# Patient Record
Sex: Female | Born: 1970 | Race: White | Hispanic: No | Marital: Single | Smoking: Former smoker
Health system: Southern US, Community
[De-identification: ages and names within clinical notes are randomized; demographics above are authoritative.]

## PROBLEM LIST (undated history)

## (undated) DIAGNOSIS — M2550 Pain in unspecified joint: Secondary | ICD-10-CM

## (undated) DIAGNOSIS — Z8489 Family history of other specified conditions: Secondary | ICD-10-CM

## (undated) DIAGNOSIS — E669 Obesity, unspecified: Secondary | ICD-10-CM

## (undated) DIAGNOSIS — F32A Depression, unspecified: Secondary | ICD-10-CM

## (undated) DIAGNOSIS — F419 Anxiety disorder, unspecified: Secondary | ICD-10-CM

## (undated) DIAGNOSIS — K219 Gastro-esophageal reflux disease without esophagitis: Secondary | ICD-10-CM

## (undated) DIAGNOSIS — E282 Polycystic ovarian syndrome: Secondary | ICD-10-CM

## (undated) DIAGNOSIS — J45909 Unspecified asthma, uncomplicated: Secondary | ICD-10-CM

## (undated) DIAGNOSIS — R519 Headache, unspecified: Secondary | ICD-10-CM

## (undated) DIAGNOSIS — R011 Cardiac murmur, unspecified: Secondary | ICD-10-CM

## (undated) DIAGNOSIS — Z87898 Personal history of other specified conditions: Secondary | ICD-10-CM

## (undated) DIAGNOSIS — R51 Headache: Secondary | ICD-10-CM

## (undated) DIAGNOSIS — D649 Anemia, unspecified: Secondary | ICD-10-CM

## (undated) DIAGNOSIS — Z8739 Personal history of other diseases of the musculoskeletal system and connective tissue: Secondary | ICD-10-CM

## (undated) DIAGNOSIS — Z86718 Personal history of other venous thrombosis and embolism: Secondary | ICD-10-CM

## (undated) DIAGNOSIS — G4700 Insomnia, unspecified: Secondary | ICD-10-CM

## (undated) DIAGNOSIS — R203 Hyperesthesia: Secondary | ICD-10-CM

## (undated) DIAGNOSIS — M549 Dorsalgia, unspecified: Secondary | ICD-10-CM

## (undated) DIAGNOSIS — G4730 Sleep apnea, unspecified: Secondary | ICD-10-CM

## (undated) DIAGNOSIS — M797 Fibromyalgia: Secondary | ICD-10-CM

## (undated) DIAGNOSIS — R600 Localized edema: Secondary | ICD-10-CM

## (undated) DIAGNOSIS — F329 Major depressive disorder, single episode, unspecified: Secondary | ICD-10-CM

## (undated) DIAGNOSIS — I10 Essential (primary) hypertension: Secondary | ICD-10-CM

## (undated) DIAGNOSIS — M5126 Other intervertebral disc displacement, lumbar region: Secondary | ICD-10-CM

## (undated) DIAGNOSIS — M1990 Unspecified osteoarthritis, unspecified site: Secondary | ICD-10-CM

## (undated) DIAGNOSIS — B2790 Infectious mononucleosis, unspecified without complication: Secondary | ICD-10-CM

## (undated) HISTORY — DX: Anxiety disorder, unspecified: F41.9

## (undated) HISTORY — DX: Localized edema: R60.0

## (undated) HISTORY — DX: Unspecified osteoarthritis, unspecified site: M19.90

## (undated) HISTORY — DX: Personal history of other venous thrombosis and embolism: Z86.718

## (undated) HISTORY — DX: Cardiac murmur, unspecified: R01.1

## (undated) HISTORY — DX: Dorsalgia, unspecified: M54.9

## (undated) HISTORY — DX: Depression, unspecified: F32.A

## (undated) HISTORY — DX: Anemia, unspecified: D64.9

## (undated) HISTORY — DX: Major depressive disorder, single episode, unspecified: F32.9

## (undated) HISTORY — DX: Pain in unspecified joint: M25.50

## (undated) HISTORY — PX: WISDOM TOOTH EXTRACTION: SHX21

## (undated) HISTORY — DX: Polycystic ovarian syndrome: E28.2

## (undated) HISTORY — DX: Obesity, unspecified: E66.9

## (undated) HISTORY — DX: Gastro-esophageal reflux disease without esophagitis: K21.9

## (undated) HISTORY — DX: Infectious mononucleosis, unspecified without complication: B27.90

## (undated) HISTORY — DX: Fibromyalgia: M79.7

---

## 2003-04-12 ENCOUNTER — Encounter: Admission: RE | Admit: 2003-04-12 | Discharge: 2003-04-12 | Payer: Self-pay | Admitting: Specialist

## 2004-05-29 ENCOUNTER — Ambulatory Visit: Payer: Self-pay | Admitting: Internal Medicine

## 2004-08-15 ENCOUNTER — Ambulatory Visit: Payer: Self-pay | Admitting: Family Medicine

## 2010-05-22 ENCOUNTER — Encounter: Payer: Self-pay | Admitting: Family Medicine

## 2010-05-22 LAB — CONVERTED CEMR LAB: Pap Smear: NORMAL

## 2010-06-19 ENCOUNTER — Ambulatory Visit
Admission: RE | Admit: 2010-06-19 | Discharge: 2010-06-19 | Payer: Self-pay | Source: Home / Self Care | Attending: Family Medicine | Admitting: Family Medicine

## 2010-06-19 DIAGNOSIS — E282 Polycystic ovarian syndrome: Secondary | ICD-10-CM | POA: Insufficient documentation

## 2010-06-19 DIAGNOSIS — J42 Unspecified chronic bronchitis: Secondary | ICD-10-CM | POA: Insufficient documentation

## 2010-07-12 NOTE — Assessment & Plan Note (Signed)
Summary: sinus infection?,cough/jbb   Vital Signs:  Patient Profile:   40 Years Old Female CC:      Cold & URI symptoms Height:     67 inches Weight:      312 pounds BMI:     49.04 O2 Sat:      98 % O2 treatment:    Room Air Temp:     99.4 degrees F oral Pulse rate:   93 / minute Pulse rhythm:   regular Resp:     20 per minute  Pt. in pain?   no  Vitals Entered By: Levonne Spiller EMT-P (June 19, 2010 3:49 PM)              Is Patient Diabetic? No  Does patient need assistance? Functional Status Self care Ambulation Normal      Current Allergies: ! CEPHALEXIN (CEPHALEXIN) ! WELLBUTRIN ! * BEE STINGSHistory of Present Illness History from: patient Chief Complaint: Cold & URI symptoms History of Present Illness: The patient is presenting today for an evauation of 1 month of symptoms of cough and congestion from yellow mucous production.  She reports that she has been having a dry hacking cough, especially at night.  She has been having sinus pressure, pain, postnasal drainage, and reports a low grade fever.  She has been having some wheezing. She reports a history of chronic bronchitis COPD.    She says that she has been concerned because it has been lingering for a long time at least a month.  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  See ROS - mild left ear ache  REVIEW OF SYSTEMS Constitutional Symptoms       Complains of fever, chills, night sweats, and fatigue.     Denies weight loss and weight gain.  Eyes       Denies change in vision, eye pain, eye discharge, glasses, contact lenses, and eye surgery. Ear/Nose/Throat/Mouth       Complains of change in hearing, ear pain, dizziness, frequent runny nose, frequent nose bleeds, sinus problems, and sore throat.      Denies hearing loss/aids, ear discharge, hoarseness, and tooth pain or bleeding.      Comments: Left Ear Ache Respiratory  Complains of dry cough, shortness of breath, and bronchitis.      Denies productive cough, wheezing, asthma, and emphysema/COPD.      Comments: Hx of Bronchitis Cardiovascular       Denies murmurs, chest pain, and tires easily with exhertion.    Gastrointestinal       Denies stomach pain, nausea/vomiting, diarrhea, constipation, blood in bowel movements, and indigestion. Genitourniary       Denies painful urination, blood or discharge from vagina, kidney stones, and loss of urinary control. Neurological       Complains of headaches.      Denies paralysis, seizures, and fainting/blackouts. Musculoskeletal       Denies muscle pain, joint pain, joint stiffness, decreased range of motion, redness, swelling, muscle weakness, and gout.  Skin       Denies bruising, unusual mles/lumps or sores, and hair/skin or nail changes.  Psych       Denies mood changes, temper/anger issues, anxiety/stress, speech problems, depression, and sleep problems.  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   Physical Exam General appearance: well developed, well nourished, no acute distress Head: normocephalic, atraumatic Eyes: conjunctivae and lids normal Pupils: equal, round, reactive to light Ears: normal, no lesions or deformities Nasal: swollen red turbinates with congestion, yellow mucus secretion, postnasal drainage Oral/Pharynx: tongue normal, posterior pharynx without erythema or exudate Neck: neck supple,  trachea midline, no masses Chest/Lungs: no rales, wheezes, or rhonchi bilateral, breath sounds equal without effort Heart: regular rate and  rhythm, no murmur Abdomen: soft,  non-tender without obvious organomegaly Extremities: normal extremities Neurological: grossly intact and non-focal Skin: no obvious rashes or lesions MSE: oriented to time, place, and person Assessment New Problems: Hx of POLYCYSTIC OVARIAN DISEASE (ICD-256.4) Hx of BRONCHITIS, CHRONIC (ICD-491.9) ACUTE SINUSITIS, UNSPECIFIED (ICD-461.9)   Patient Education: Patient and/or caregiver instructed in the following: rest, fluids. The risks, benefits and possible side effects were clearly explained and discussed with the patient.  The patient verbalized clear understanding.  The patient was given instructions to return if symptoms don't improve, worsen or new changes develop.  If it is not during clinic hours and the patient cannot get back to this clinic then the patient was told to seek medical care at an available urgent care or emergency department.  The patient verbalized understanding.   Demonstrates willingness to comply.  Plan New Medications/Changes: ZYRTEC-D ALLERGY & CONGESTION 5-120 MG XR12H-TAB (CETIRIZINE-PSEUDOEPHEDRINE) take 1 by mouth every 12 hours as needed for nasal congestion, runny nose  #12 x 0, 06/19/2010, Deshanti Adcox MD VENTOLIN HFA 108 (90 BASE) MCG/ACT AERS (ALBUTEROL SULFATE) 2 puffs every 4 hours as needed cough, wheezes, SOB  #1 x 0, 06/19/2010, Nazia Rhines MD FLUTICASONE PROPIONATE 50 MCG/ACT SUSP (FLUTICASONE PROPIONATE) 2 sprays per nostril once daily  #1 x 0, 06/19/2010, Hartleigh Edmonston MD DOXYCYCLINE HYCLATE 100 MG TABS (DOXYCYCLINE HYCLATE) take 1 by mouth with food until completed.  Caution Will Decrease effectiveness of Nuva Ring!  #20 x 0, 06/19/2010, Standley Dakins MD  Planning Comments:   Go to the pharmacy and pick up your prescription (s).  It may take up to 30 mins for electronic prescriptions to be delivered to the pharmacy.  Please call if your pharmacy has not received your prescriptions after 30 minutes.   Return or go to the ER if no  improvement or symptoms getting worse.     Follow Up: Follow up in 2-3 days if no improvement, Follow up on an as needed basis, Follow up with Primary Physician  The patient and/or caregiver has been counseled thoroughly with regard to medications prescribed including dosage, schedule, interactions, rationale for use, and possible side effects and they verbalize understanding.  Diagnoses and expected course of recovery discussed and will return if not improved as expected or if the condition worsens. Patient and/or caregiver verbalized understanding.  Prescriptions: ZYRTEC-D ALLERGY & CONGESTION 5-120 MG XR12H-TAB (CETIRIZINE-PSEUDOEPHEDRINE) take 1 by mouth every 12 hours as needed for nasal congestion, runny nose  #12 x 0   Entered and Authorized by:   Standley Dakins MD   Signed by:   Standley Dakins MD on 06/19/2010   Method used:   Electronically to        [facility] (retail)       [address]       [place], [place]  [address]       Ph: [phone]       Fax: [phone]   RxID:   [id number] VENTOLIN HFA 108 (90 BASE) MCG/ACT AERS (ALBUTEROL SULFATE) 2 puffs every 4 hours as needed cough, wheezes, SOB  #1 x 0   Entered and Authorized by:   Standley Dakins MD   Signed by:   Standley Dakins MD on 06/19/2010   Method used:   Electronically to        [facility] (retail)       [address]       [place], [place]  [address]       Ph: [phone]       Fax: [phone]   RxID:   [id number] FLUTICASONE PROPIONATE 50 MCG/ACT SUSP (FLUTICASONE PROPIONATE) 2 sprays per nostril once daily  #1 x 0   Entered and Authorized by:   Standley Dakins MD   Signed by:   Standley Dakins MD on 06/19/2010   Method used:   Electronically to        [facility] (retail)       [address]       [place], [place]  [address]       Ph: [phone]        Fax: [phone]   RxID:   [id number] DOXYCYCLINE HYCLATE 100 MG TABS (DOXYCYCLINE HYCLATE) take 1 by mouth with food until completed.  Caution Will Decrease effectiveness of Nuva Ring!  #20 x 0   Entered and Authorized by:   Standley Dakins MD   Signed by:   Standley Dakins MD on 06/19/2010   Method used:   Electronically to        [facility] ([facility])       [address]       [place], [place]  [address]       Ph: [phone]       Fax: [phone]   RxID:   [id number]   Patient Instructions: 1)  Take your antibiotic as prescribed until ALL of it is gone, but stop if you develop a rash or swelling and contact our office as soon as possible. 2)  Acute sinusitis symptoms for less than 10 days are not helped by antibiotics.Use warm moist compresses, and over the counter decongestants ( only as directed). Call if no improvement in 5-7 days, sooner if increasing pain, fever, or new symptoms. 3)  Return or go to the ER if no improvement or symptoms getting worse.   4)  Go to the pharmacy and pick up your prescription (s).  It may take up to 30 mins for electronic prescriptions to be delivered to the pharmacy.  Please call if your pharmacy has not received your prescriptions after 30 minutes.   5)  The patient was informed that there is no on-call provider or services available at this clinic during off-hours (when the clinic is closed).  If the patient developed a problem or concern that required immediate attention, the patient was advised to go the the nearest available urgent care or emergency department for medical care.  The patient verbalized understanding.

## 2010-08-02 ENCOUNTER — Encounter: Payer: Self-pay | Admitting: Family Medicine

## 2010-08-02 ENCOUNTER — Other Ambulatory Visit: Payer: Self-pay | Admitting: Family Medicine

## 2010-08-02 ENCOUNTER — Ambulatory Visit (INDEPENDENT_AMBULATORY_CARE_PROVIDER_SITE_OTHER): Payer: BC Managed Care – PPO | Admitting: Family Medicine

## 2010-08-02 DIAGNOSIS — M62838 Other muscle spasm: Secondary | ICD-10-CM | POA: Insufficient documentation

## 2010-08-02 DIAGNOSIS — R5381 Other malaise: Secondary | ICD-10-CM | POA: Insufficient documentation

## 2010-08-02 DIAGNOSIS — R5383 Other fatigue: Secondary | ICD-10-CM

## 2010-08-02 LAB — HEPATIC FUNCTION PANEL
ALT: 15 U/L (ref 0–35)
AST: 17 U/L (ref 0–37)
Albumin: 3.5 g/dL (ref 3.5–5.2)
Alkaline Phosphatase: 34 U/L — ABNORMAL LOW (ref 39–117)
Bilirubin, Direct: 0.1 mg/dL (ref 0.0–0.3)
Total Bilirubin: 0.6 mg/dL (ref 0.3–1.2)
Total Protein: 6.6 g/dL (ref 6.0–8.3)

## 2010-08-02 LAB — IBC PANEL
Iron: 84 ug/dL (ref 42–145)
Saturation Ratios: 22.7 % (ref 20.0–50.0)
Transferrin: 263.8 mg/dL (ref 212.0–360.0)

## 2010-08-02 LAB — CBC WITH DIFFERENTIAL/PLATELET
Basophils Absolute: 0 10*3/uL (ref 0.0–0.1)
Basophils Relative: 0.4 % (ref 0.0–3.0)
Eosinophils Absolute: 0.1 10*3/uL (ref 0.0–0.7)
Eosinophils Relative: 1.7 % (ref 0.0–5.0)
HCT: 36.6 % (ref 36.0–46.0)
Hemoglobin: 12.7 g/dL (ref 12.0–15.0)
Lymphocytes Relative: 31 % (ref 12.0–46.0)
Lymphs Abs: 2.4 10*3/uL (ref 0.7–4.0)
MCHC: 34.6 g/dL (ref 30.0–36.0)
MCV: 91 fl (ref 78.0–100.0)
Monocytes Absolute: 0.5 10*3/uL (ref 0.1–1.0)
Monocytes Relative: 7 % (ref 3.0–12.0)
Neutro Abs: 4.6 10*3/uL (ref 1.4–7.7)
Neutrophils Relative %: 59.9 % (ref 43.0–77.0)
Platelets: 251 10*3/uL (ref 150.0–400.0)
RBC: 4.02 Mil/uL (ref 3.87–5.11)
RDW: 12.9 % (ref 11.5–14.6)
WBC: 7.7 10*3/uL (ref 4.5–10.5)

## 2010-08-02 LAB — BASIC METABOLIC PANEL
BUN: 12 mg/dL (ref 6–23)
CO2: 25 mEq/L (ref 19–32)
Calcium: 8.9 mg/dL (ref 8.4–10.5)
Chloride: 108 mEq/L (ref 96–112)
Creatinine, Ser: 0.8 mg/dL (ref 0.4–1.2)
GFR: 89.59 mL/min (ref >60.00)
Glucose, Bld: 87 mg/dL (ref 70–99)
Potassium: 4.1 mEq/L (ref 3.5–5.1)
Sodium: 139 mEq/L (ref 135–145)

## 2010-08-02 LAB — TSH: TSH: 2.07 u[IU]/mL (ref 0.35–5.50)

## 2010-08-02 LAB — B12 AND FOLATE PANEL
Folate: 18.6 ng/mL (ref >5.9)
Vitamin B-12: 237 pg/mL (ref 211–911)

## 2010-08-03 LAB — CONVERTED CEMR LAB: Vit D, 25-Hydroxy: 17 ng/mL — ABNORMAL LOW (ref 30–89)

## 2010-08-07 NOTE — Assessment & Plan Note (Signed)
Summary: NEW PATIENT TO EST/CLE  [employer],MAILED NPP   Vital Signs:  Patient profile:   40 year old female Height:      67 inches Weight:      323.50 pounds BMI:     50.85 Temp:     98.1 degrees F oral Pulse rate:   76 / minute Pulse rhythm:   regular BP sitting:   112 / 82  (right arm) Cuff size:   large  Vitals Entered By: Linde Gillis CMA Duncan Dull) (August 02, 2010 10:53 AM) CC: new patient, establish care   History of Present Illness: 40 yo here to establish care with complaint of fatigue.  fatigue- she is concerned that there is something wrong with her heart because her dad had very early onset CA, first MI at 19, s/p CABG x 3, stents.   past 6 months, progressively worsening fatigue, DOE, atypical CP not relieved by rest.  CP can occur at rest or with exertion, very fleeting, central location.  no radiation, no diaphoresis, nausea, or vomiting. no changes in her stool, hot/cold intolerance or palpiations.  does not sleep well.  difficulty falling and staying asleep. per pt, had normal sleep study a few years ago.  periods not too heavy, on nuvaring.  neck pain- frequent  tension headaches and noticed lump on left side of neck when she has these headache. no focal neurological deficits. no blurred vision.   Current Medications (verified): 1)  Multi-Vitamin .Marland Kitchen.. 1 Per Day 2)  Nuvaring 0.12-0.015 Mg/24hr Ring (Etonogestrel-Ethinyl Estradiol) 3)  Fluticasone Propionate 50 Mcg/act Susp (Fluticasone Propionate) .... 2 Sprays Per Nostril Once Daily 4)  Ventolin Hfa 108 (90 Base) Mcg/act Aers (Albuterol Sulfate) .... 2 Puffs Every 4 Hours As Needed Cough, Wheezes, Sob 5)  Zyrtec-D Allergy & Congestion 5-120 Mg Xr12h-Tab (Cetirizine-Pseudoephedrine) .... Take 1 By Mouth Every 12 Hours As Needed For Nasal Congestion, Runny Nose 6)  Cyclobenzaprine Hcl 10 Mg  Tabs (Cyclobenzaprine Hcl) .Marland Kitchen.. 1 By Mouth 2 Times Daily As Needed For Back Pain  Allergies: 1)  ! Cephalexin  (Cephalexin) 2)  ! Wellbutrin 3)  ! 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    Family History:  Family History of CAD Female 1st degree relative <50- dad had first MI at 45, s/p CABG x 3  Review of Systems      See HPI General:  Complains of fatigue and sleep disorder; denies loss of appetite. Eyes:  Denies blurring. ENT:  Denies difficulty swallowing. CV:  Complains of chest pain or discomfort and shortness of breath with exertion; denies fainting, fatigue, and lightheadness. Resp:  Complains of shortness of breath. GI:  Denies abdominal pain, bloody stools, and change in bowel habits. GU:  Denies abnormal vaginal bleeding. MS:  Denies muscle weakness. Derm:  Denies rash. Neuro:  Complains of headaches; denies seizures, sensation of room spinning, tingling, tremors, visual disturbances, and weakness. Psych:  Denies anxiety and depression. Endo:  Denies cold intolerance and heat intolerance. Heme:  Denies abnormal bruising.  Physical Exam  General:   well nourished, no  acute distress, overweight-appearing.   Head:  Normocephalic and atraumatic without obvious abnormalities. No apparent alopecia or balding. Eyes:  vision grossly intact, pupils equal, pupils round, and pupils reactive to light.   Ears:  R ear normal and L ear normal.   Nose:  no external deformity and nose piercing noted.   Mouth:  good dentition.   Neck:  no palpable nodes, tight muscular knot on left side of neck, non tender to  palp. full ROM.   Chest Wall:  No deformities, masses, or tenderness noted. Lungs:  Normal respiratory effort, chest expands symmetrically. Lungs are clear to auscultation, no crackles or wheezes. Heart:  Normal rate and regular rhythm. S1 and S2 normal without gallop, murmur, click, rub or other extra sounds. Abdomen:  Bowel sounds positive,abdomen soft and non-tender without masses, organomegaly or hernias noted. Msk:  normal ROM.   Extremities:  no edema Neurologic:  alert & oriented X3 and gait normal.   Skin:  Intact without suspicious lesions or rashes Psych:  Cognition and judgment appear intact. Alert and cooperative with normal attention span and concentration. No apparent delusions, illusions, hallucinations   Impression & Recommendations:  Problem # 1:  FATIGUE (ICD-780.79) Assessment New Likely multifactorial- discussed deconditioning. will check battery of blood tests to rule out reversible causes. EKG NSR, ?old qwaves.  given her strong family history and her obestiy, will refer to cards for stress test/work up. Pt in agreement with plan. Orders: Venipuncture (98119) TLB-B12 + Folate Pnl (14782_95621-H08/MVH) TLB-IBC Pnl (Iron/FE;Transferrin) (83550-IBC) TLB-BMP (Basic Metabolic Panel-BMET) (80048-METABOL) TLB-CBC Platelet - w/Differential (85025-CBCD) TLB-Hepatic/Liver Function Pnl (80076-HEPATIC) TLB-TSH (Thyroid Stimulating Hormone) (84443-TSH) T-Vitamin D (25-Hydroxy) (84696-29528) Cardiology Referral (Cardiology)  Problem # 2:  MUSCLE SPASM, TRAPEZIUS MUSCLE, LEFT (ICD-728.85) Assessment: New flexeril as needed.  Discussed stretches.  Complete Medication List: 1)  Multi-vitamin  .Marland Kitchen.. 1 per day 2)  Nuvaring 0.12-0.015 Mg/24hr Ring (Etonogestrel-ethinyl estradiol) 3)  Fluticasone Propionate 50 Mcg/act Susp (Fluticasone propionate) .... 2 sprays per nostril once daily 4)  Ventolin Hfa 108 (90 Base) Mcg/act Aers (Albuterol sulfate) .... 2 puffs every 4 hours as  needed cough, wheezes, sob 5)  Zyrtec-d Allergy & Congestion 5-120 Mg Xr12h-tab (Cetirizine-pseudoephedrine) .... Take 1 by mouth every 12 hours as needed for nasal congestion, runny nose 6)  Cyclobenzaprine Hcl 10 Mg Tabs (Cyclobenzaprine hcl) .Marland Kitchen.. 1 by mouth 2 times daily as needed for back pain  Patient Instructions: 1)  Please stop by to see Shirlee Limerick on your way out. Prescriptions: CYCLOBENZAPRINE HCL 10 MG  TABS (CYCLOBENZAPRINE HCL) 1 by mouth 2 times daily as needed for back pain  #20 x 0   Entered and Authorized by:   Ruthe Mannan MD   Signed by:   Ruthe Mannan MD on 08/02/2010   Method used:   Electronically to        [facility] (retail)       [address]       [place], [place]  [address]       Ph: [phone]       Fax: [phone]   RxID:   [id number] CYCLOBENZAPRINE HCL 10 MG  TABS (CYCLOBENZAPRINE HCL) 1 by mouth 2 times daily as needed for back pain  #20 x 0   Entered and Authorized by:   Ruthe Mannan MD   Signed by:   Ruthe Mannan MD on 08/02/2010   Method used:   Electronically to  [facility] (retail)       [address]       [place], [place]  [address]       Ph: [phone]       Fax: [phone]   RxID:   [id number]    Orders Added: 1)  Venipuncture [40102] 2)  TLB-B12 + Folate Pnl [82746_82607-B12/FOL] 3)  TLB-IBC Pnl (Iron/FE;Transferrin) [83550-IBC] 4)  TLB-BMP (Basic Metabolic Panel-BMET) [80048-METABOL] 5)  TLB-CBC Platelet - w/Differential [85025-CBCD] 6)  TLB-Hepatic/Liver Function Pnl [80076-HEPATIC] 7)  TLB-TSH (Thyroid Stimulating Hormone) [84443-TSH] 8)  T-Vitamin D (25-Hydroxy) [72536-64403] 9)  Cardiology Referral [Cardiology] 10)  New Patient Level III [47425]    Current Allergies (reviewed today): ! CEPHALEXIN (CEPHALEXIN) ! WELLBUTRIN ! * BEE STINGS  PAP Result Date:  05/22/2010 PAP Result:  normal- historical

## 2010-08-13 ENCOUNTER — Encounter: Payer: Self-pay | Admitting: Family Medicine

## 2010-08-13 ENCOUNTER — Encounter: Payer: Self-pay | Admitting: Cardiology

## 2010-08-13 ENCOUNTER — Ambulatory Visit: Payer: Self-pay | Admitting: Family Medicine

## 2010-08-13 ENCOUNTER — Ambulatory Visit (INDEPENDENT_AMBULATORY_CARE_PROVIDER_SITE_OTHER): Payer: BC Managed Care – PPO | Admitting: Cardiology

## 2010-08-13 DIAGNOSIS — R0609 Other forms of dyspnea: Secondary | ICD-10-CM

## 2010-08-13 DIAGNOSIS — Z0289 Encounter for other administrative examinations: Secondary | ICD-10-CM

## 2010-08-13 DIAGNOSIS — R079 Chest pain, unspecified: Secondary | ICD-10-CM | POA: Insufficient documentation

## 2010-08-13 DIAGNOSIS — R0602 Shortness of breath: Secondary | ICD-10-CM | POA: Insufficient documentation

## 2010-08-13 DIAGNOSIS — R9389 Abnormal findings on diagnostic imaging of other specified body structures: Secondary | ICD-10-CM | POA: Insufficient documentation

## 2010-08-20 ENCOUNTER — Telehealth (INDEPENDENT_AMBULATORY_CARE_PROVIDER_SITE_OTHER): Payer: Self-pay | Admitting: *Deleted

## 2010-08-21 ENCOUNTER — Ambulatory Visit (HOSPITAL_COMMUNITY): Payer: BC Managed Care – PPO

## 2010-08-21 ENCOUNTER — Other Ambulatory Visit (INDEPENDENT_AMBULATORY_CARE_PROVIDER_SITE_OTHER): Payer: BC Managed Care – PPO

## 2010-08-21 ENCOUNTER — Ambulatory Visit (HOSPITAL_COMMUNITY): Payer: BC Managed Care – PPO | Attending: Cardiology

## 2010-08-21 ENCOUNTER — Encounter (HOSPITAL_COMMUNITY): Payer: BC Managed Care – PPO

## 2010-08-21 ENCOUNTER — Other Ambulatory Visit: Payer: Self-pay | Admitting: Cardiology

## 2010-08-21 ENCOUNTER — Encounter: Payer: Self-pay | Admitting: Cardiology

## 2010-08-21 DIAGNOSIS — E669 Obesity, unspecified: Secondary | ICD-10-CM | POA: Insufficient documentation

## 2010-08-21 DIAGNOSIS — E785 Hyperlipidemia, unspecified: Secondary | ICD-10-CM

## 2010-08-21 DIAGNOSIS — R0609 Other forms of dyspnea: Secondary | ICD-10-CM | POA: Insufficient documentation

## 2010-08-21 DIAGNOSIS — R9431 Abnormal electrocardiogram [ECG] [EKG]: Secondary | ICD-10-CM

## 2010-08-21 DIAGNOSIS — R072 Precordial pain: Secondary | ICD-10-CM

## 2010-08-21 DIAGNOSIS — Z8249 Family history of ischemic heart disease and other diseases of the circulatory system: Secondary | ICD-10-CM | POA: Insufficient documentation

## 2010-08-21 DIAGNOSIS — R0989 Other specified symptoms and signs involving the circulatory and respiratory systems: Secondary | ICD-10-CM

## 2010-08-21 LAB — LIPID PANEL
Cholesterol: 205 mg/dL — ABNORMAL HIGH (ref 0–200)
HDL: 49.7 mg/dL (ref >39.00)
Total CHOL/HDL Ratio: 4
Triglycerides: 140 mg/dL (ref 0.0–149.0)
VLDL: 28 mg/dL (ref 0.0–40.0)

## 2010-08-21 LAB — HEPATIC FUNCTION PANEL
ALT: 17 U/L (ref 0–35)
AST: 24 U/L (ref 0–37)
Albumin: 3.8 g/dL (ref 3.5–5.2)
Alkaline Phosphatase: 36 U/L — ABNORMAL LOW (ref 39–117)
Bilirubin, Direct: 0.2 mg/dL (ref 0.0–0.3)
Total Bilirubin: 0.8 mg/dL (ref 0.3–1.2)
Total Protein: 6.8 g/dL (ref 6.0–8.3)

## 2010-08-21 LAB — LDL CHOLESTEROL, DIRECT: Direct LDL: 129.8 mg/dL

## 2010-08-21 NOTE — Assessment & Plan Note (Signed)
Summary: CP/Strong Fam Hist of CAD/AMD   Visit Type:  Initial Consult Primary Provider:  Aron,Talia  CC:  c/o fatigue, shortness of breath, lightheaded and nauseated, and dull chest pain that comes & goes with indigestion at night..  History of Present Illness: 40 yo with history of depression and obesity presents for evaluation of fatigue, dyspnea, and chest pain.  Patient feels tired "all the time" and has felt like this for at least 6 months.  She is short of breath walking across [employer] (where she works).  She is short of breath climbing a flight of steps.  Occasional wheezing but does not seem associated with exertional dyspnea.  She gets occasional random atypical chest tightness.  This lasts for a few seconds and is not related to exertion or meals.  Her father had his first MI at 30, and she is very worried about the possibility of CAD.  She is overweight but denies any recent weight gain.  It has been stably high for years.  She is too fatigued after work to do much formal exercise.  Initial lab workup by Dr. Dayton Martes was unremarkable.   ECG: NSR, normal  Labs (2/12): K 4.1, creatinine 0.8, TSH normal, HCT 36.6  Current Medications (verified): 1)  Multi-Vitamin .Marland Kitchen.. 1 Per Day 2)  Nuvaring 0.12-0.015 Mg/24hr Ring (Etonogestrel-Ethinyl Estradiol) 3)  Fluticasone Propionate 50 Mcg/act Susp (Fluticasone Propionate) .... 2 Sprays Per Nostril Once Daily 4)  Ventolin Hfa 108 (90 Base) Mcg/act Aers (Albuterol Sulfate) .... 2 Puffs Every 4 Hours As Needed Cough, Wheezes, Sob 5)  Zyrtec-D Allergy & Congestion 5-120 Mg Xr12h-Tab (Cetirizine-Pseudoephedrine) .... Take 1 By Mouth Every 12 Hours As Needed For Nasal Congestion, Runny Nose 6)  Cyclobenzaprine Hcl 10 Mg  Tabs (Cyclobenzaprine Hcl) .Marland Kitchen.. 1 By Mouth 2 Times Daily As Needed For Back Pain 7)  Vitamin D3 50000 Unit Caps (Cholecalciferol) .... Take One Tablet By Mouth Once A Week For Six Weeks  Allergies (verified): 1)  ! Cephalexin  (Cephalexin) 2)  ! Wellbutrin 3)  ! * Bee Stings  Past History:  Past Surgical History: Last updated: 06/19/2010 Denies surgical history  Family History: Last updated: 08/13/2010 Father with MI at 96.  Mother without premature CAD and no heart disease in siblings.   Social History: Last updated: 08/13/2010 Nonsmoker, no drugs.   Rare ETOH.  Occupation: Database administrator.  Lives in [place]  Past Medical History: 1. "chronic bronchitis" (? asthma) 2. OA 3. PCOS 4. Depression 5. Obesity 6. Vitamin D deficiency  Family History: Father with MI at 6.  Mother without premature CAD and no heart disease in siblings.   Social History: Nonsmoker, no drugs.   Rare ETOH.  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   Vital Signs:  Patient profile:   40 year old female Height:      67 inches Weight:      321 pounds BMI:     50.46 Pulse rate:   97 / minute BP sitting:   140 / 88  (left arm) Cuff size:   large  Vitals Entered By: Bishop Dublin, CMA (August 13, 2010 11:27 AM)  Physical Exam  General:  Well developed, well nourished, in no acute distress.  Obese.  Head:  normocephalic and atraumatic Nose:  no deformity, discharge, inflammation, or lesions Mouth:  Teeth, gums and palate normal. Oral  mucosa normal. Neck:  Neck supple, no JVD. No masses, thyromegaly or abnormal cervical nodes. Lungs:  Clear bilaterally to auscultation and percussion. No wheezing.  Heart:  Non-displaced PMI, chest non-tender; regular rate and rhythm, S1, S2 without murmurs, rubs or gallops. Carotid upstroke normal, no bruit. Pedals normal pulses. No edema, no varicosities. Abdomen:  Bowel sounds positive; abdomen soft and non-tender without masses, organomegaly, or hernias noted. No hepatosplenomegaly. Extremities:  No clubbing or cyanosis. Neurologic:  Alert and oriented x  3. Skin:  Intact without lesions or rashes. Psych:  Normal affect.   Impression & Recommendations:  Problem # 1:  CHEST PAIN UNSPECIFIED (ICD-786.50) Atypical chest pain, exertional dyspnea, generalized fatigue.  Lab workup was unremarkable.  She does have a family history of premature CAD.  Given the exertional dyspnea component, I will get a stress echo (with a full baseline echo) for risk stratification.  Symptoms may be due to obesity/deconditioning, would also consider depression as a factor in the fatigue.  She additionally may have a component of asthma.   I will also get lipids/LFTs as these do not seem to have been done recently.    Other Orders: Stress Echo (Stress Echo) T-Lipid Profile ([id number]) T-Hepatic Function [phone])  Patient Instructions: 1)  Your physician recommends that you schedule a follow-up appointment in: After your stress echo we will call with f/u appt. 2)  Your physician recommends that you continue on your current medications as directed. Please refer to the Current Medication list given to you today. 3)  Your physician has requested that you have a stress echocardiogram. For further information please visit [email or web address]  Please follow instruction sheet as given. 4)  Your physician recommends that you return for a FASTING lipid profile: Same morning of your Stress Echo.

## 2010-08-22 ENCOUNTER — Ambulatory Visit: Payer: BC Managed Care – PPO | Admitting: Cardiology

## 2010-08-23 ENCOUNTER — Encounter: Payer: Self-pay | Admitting: Internal Medicine

## 2010-08-23 ENCOUNTER — Ambulatory Visit (HOSPITAL_COMMUNITY): Payer: BC Managed Care – PPO | Attending: Cardiology

## 2010-08-23 DIAGNOSIS — R0602 Shortness of breath: Secondary | ICD-10-CM | POA: Insufficient documentation

## 2010-08-23 DIAGNOSIS — R0989 Other specified symptoms and signs involving the circulatory and respiratory systems: Secondary | ICD-10-CM

## 2010-08-23 DIAGNOSIS — R079 Chest pain, unspecified: Secondary | ICD-10-CM | POA: Insufficient documentation

## 2010-08-28 ENCOUNTER — Telehealth: Payer: Self-pay

## 2010-08-28 NOTE — Telephone Encounter (Signed)
Left message on machine to call office regarding myoview results.   Danielle Irwin

## 2010-08-28 NOTE — Progress Notes (Signed)
Summary: nuc pre procedure  Phone Note Outgoing Call Call back at Home Phone [phone]   Call placed by: Cathlyn Parsons RN,  August 20, 2010 2:14 PM Call placed to: Patient Reason for Call: Confirm/change Appt Summary of Call: Mailbox on answer machine was full and unable to leave message.  Left our ph # for patient to call back to receive information.

## 2010-08-28 NOTE — Assessment & Plan Note (Addendum)
Summary: Cardiology Nuclear Testing  Nuclear Med Background Indications for Stress Test: Evaluation for Ischemia   History: COPD   Symptoms: Chest Pain, Chest Pain with Exertion, Dizziness, DOE, Fatigue, Fatigue with Exertion, Nausea, Near Syncope, Rapid HR, SOB    Nuclear Pre-Procedure Cardiac Risk Factors: Family History - CAD, History of Smoking, Lipids Caffeine/Decaff Intake: None NPO After: 9:30 PM IV 0.9% NS with Angio Cath: 24g     IV Site: R Hand IV Started by: Irean Hong, RN Chest Size (in) 44     Cup Size DDD     Height (in): 68 Weight (lb): 322 BMI: 49.14 Tech Comments: Stress Echo changed to lexiscan due to HTN per Dr. Algis Downs. [place]/ Burna Mortimer Deal/ Cleon Gustin.  Nuclear Med Study 1 or 2 day study:  2 day     Stress Test Type:  Eugenie Birks Reading MD:  Dietrich Pates, MD     Referring MD:  [place] Resting Radionuclide:  Technetium 26m Tetrofosmin     Resting Radionuclide Dose:  33.0 mCi  Stress Radionuclide:  Technetium 71m Tetrofosmin     Stress Radionuclide Dose:  33.0 mCi   Stress Protocol      Max HR:  112 bpm     Predicted Max HR:  180 bpm  Max Systolic BP: 140 mm Hg     Percent Max HR:  62.22 %Rate Pressure Product:  78295  Lexiscan: 0.4 mg   Stress Test Technologist:  Irean Hong,  RN     Nuclear Technologist:  Domenic Polite, CNMT  Rest Procedure  Myocardial perfusion imaging was performed at rest 45 minutes following the intravenous administration of Technetium 72m Tetrofosmin.  Stress Procedure  The patient received IV Lexiscan 0.4 mg over 15-seconds.  Technetium 71m Tetrofosmin injected at 30-seconds.  There were no significant changes with lexiscan. The BP 140/106 manually after lexiscan.  Quantitative spect images were obtained after a 45 minute delay.  QPS Raw Data Images:  Images were motion corrected.  Soft tissue (breast) overlies hearrt. Stress Images:  Normal perfusion and apical thinning. Rest Images:  No signficant change from the stress  images Subtraction (SDS):  No evidence of ischemia. Transient Ischemic Dilatation:  0.92  (Normal <1.22)  Lung/Heart Ratio:  0.24  (Normal <0.45)  Quantitative Gated Spect Images QGS EDV:  121 ml QGS ESV:  36 ml QGS EF:  70 %   Overall Impression  Exercise Capacity: Lexiscan with no exercise. BP Response: Normal blood pressure response. Clinical Symptoms: Chest pressure ECG Impression: No significant ST segment change suggestive of ischemia. Overall Impression: Normal stress nuclear study.  Appended Document: Cardiology Nuclear Testing Images were not motion corrected.  Appended Document: Cardiology Nuclear Testing normal study

## 2010-08-28 NOTE — Letter (Signed)
Summary: [facility]   [facility]   Imported By: Kassie Mends 08/20/2010 10:31:19  _____________________________________________________________________  External Attachment:    Type:   Image     Comment:   External Document

## 2010-08-31 NOTE — Telephone Encounter (Signed)
Notified patient nuclear test normal.

## 2010-09-21 ENCOUNTER — Telehealth: Payer: Self-pay | Admitting: Cardiology

## 2010-09-21 ENCOUNTER — Ambulatory Visit (INDEPENDENT_AMBULATORY_CARE_PROVIDER_SITE_OTHER): Payer: BC Managed Care – PPO | Admitting: Cardiology

## 2010-09-21 ENCOUNTER — Encounter: Payer: Self-pay | Admitting: Cardiology

## 2010-09-21 DIAGNOSIS — R079 Chest pain, unspecified: Secondary | ICD-10-CM

## 2010-09-21 DIAGNOSIS — R0602 Shortness of breath: Secondary | ICD-10-CM

## 2010-09-21 MED ORDER — OMEGA-3 FATTY ACIDS 1000 MG PO CAPS: 2.0000 g | ORAL_CAPSULE | Freq: Every day | ORAL | Status: AC

## 2010-09-21 MED ORDER — FLUTICASONE PROPIONATE HFA 110 MCG/ACT IN AERO: 1.0000 | INHALATION_SPRAY | Freq: Two times a day (BID) | RESPIRATORY_TRACT | Status: DC

## 2010-09-21 NOTE — Telephone Encounter (Signed)
[facility] pharmacy needing verification on an Rx that was called in.

## 2010-09-21 NOTE — Patient Instructions (Signed)
Start Flovent Inhaler as directed. Start taking Fish oil 2000 mg daily, you can get this OTC. Follow up as needed.

## 2010-09-22 NOTE — Progress Notes (Signed)
40 yo with history of depression and obesity presents for followup of fatigue, dyspnea, and chest pain.  Patient feels tired "all the time" and has felt like this for at least 6 months.  She is short of breath walking across [employer] (where she works).  She is short of breath climbing a flight of steps.  Occasional wheezing but does not seem associated with exertional dyspnea.  Wheezing seems to have gotten worse with the pollen season and she has been using her albuterol inhaler 3-4 times a week.  She gets occasional random atypical chest tightness.  This lasts for a few seconds and is not related to exertion or meals.  Her father had his first MI at 3, and she has been very worried about the possibility of CAD. She is overweight but denies any recent weight gain.  It has been stably high for years.  She is too fatigued after work to do much formal exercise.    We did a [employer], showing no ischemia or infarction.  Echo showed normal LV systolic function, normal RV, no significant valvular abnormalities, and no pulmonary hypertension.   Labs (2/12): K 4.1, creatinine 0.8, TSH normal, HCT 36.6 Labs (3/12): HDL 50, LDL 130  Allergies (verified):  1)  ! Cephalexin (Cephalexin) 2)  ! Wellbutrin 3)  ! * Bee Stings  Family History: Last updated: 08/13/2010 Father with MI at 5.  Mother without premature CAD and no heart disease in siblings.   Social History: Last updated: 08/13/2010 Nonsmoker, no drugs.   Rare ETOH.  Occupation: Database administrator.  Lives in [place]  Past Medical History: 1. "chronic bronchitis" (? asthma) 2. OA 3. PCOS 4. Depression 5. Obesity 6. Vitamin D deficiency 7. Atypical chest pain: Lexiscan myoview (3/12) with EF 70%, no evidence for ischemia or infarction.  8. Exertional dyspnea: Echo (3/12) with EF 65%, normal RV size and systolic function, PA systolic pressure 35 mmHg, no significant valvular abnormalities.   Current Outpatient  Prescriptions  Medication Sig Dispense Refill  . albuterol (VENTOLIN HFA) 108 (90 BASE) MCG/ACT inhaler Inhale 2 puffs into the lungs every 6 (six) hours as needed.        . calcium carbonate (TUMS - DOSED IN MG ELEMENTAL CALCIUM) 500 MG chewable tablet Chew 1 tablet by mouth daily.        . cetirizine-pseudoephedrine (ZYRTEC-D ALLERGY & CONGESTION) 5-120 MG per tablet Take 1 tablet by mouth 2 (two) times daily.        . Cholecalciferol (VITAMIN D3) 50000 UNITS CAPS Take 1 capsule by mouth once a week. For 6 weeks       . cyclobenzaprine (FLEXERIL) 10 MG tablet Take 10 mg by mouth 2 (two) times daily as needed.        . etonogestrel-ethinyl estradiol (NUVARING) 0.12-0.015 MG/24HR vaginal ring Place 1 each vaginally every 28 (twenty-eight) days. Insert vaginally and leave in place for 3 consecutive weeks, then remove for 1 week.       . fluticasone (FLONASE) 50 MCG/ACT nasal spray 2 sprays by Nasal route daily.        . Multiple Vitamin (MULTIVITAMIN) tablet Take 1 tablet by mouth daily.        . fish oil-omega-3 fatty acids 1000 MG capsule Take 2 capsules (2 g total) by mouth daily.  60 capsule  11  . fluticasone (FLOVENT HFA) 110 MCG/ACT inhaler Inhale 1 puff into the lungs 2 (two) times daily.  1 Inhaler  12  BP 130/98  Pulse 84  Ht 5\' 8"  (1.727 m)  Wt 326 lb (147.873 kg)  BMI 49.57 kg/m2 General: NAD, obese.  Neck: No JVD, no thyromegaly or thyroid nodule.  Lungs: Clear to auscultation bilaterally with normal respiratory effort.  No wheezing.  CV: Nondisplaced PMI.  Heart regular S1/S2, no S3/S4, no murmur.  Trace ankle edema.  No carotid bruit.  Normal pedal pulses.  Abdomen: Soft, nontender, no hepatosplenomegaly, no distention.  Skin: Intact without lesions or rashes.  Neurologic: Alert and oriented x 3.  Psych: Normal affect. Extremities: No clubbing or cyanosis.  HEENT: Normal.

## 2010-09-22 NOTE — Assessment & Plan Note (Signed)
No ischemic on myoview.  Normal LV systolic function on echo with no valvular dysfunction, normal RV, and normal PA pressure.  I suspect that her dyspnea is due to a combination of obesity/deconditioning and possibly asthma, given her periodic wheezing.  As she is using her rescue inhaler more often, I will have her start a standing steroid inhaler, will use fluticasone 110 mcg inhaled bid.  I also strongly encouraged her to increase her exercise level and work on dieting.

## 2010-09-22 NOTE — Assessment & Plan Note (Addendum)
Atypical chest pain.  Lexiscan myoview was normal.  Suspect noncardiac.  Given family history of CAD, think it would be reasonable for her to start over-the-counter fish oil for vascular health.

## 2010-09-24 ENCOUNTER — Telehealth: Payer: Self-pay | Admitting: Cardiology

## 2010-09-24 NOTE — Telephone Encounter (Signed)
Pharmacy calling re verifcation of refill, thinks it's loraza

## 2010-09-24 NOTE — Telephone Encounter (Signed)
pharmacy needs to know if the prescription written by Mal Amabile, RN needs to be lovaza will route this to the proper person to verify with pharmacy

## 2010-09-24 NOTE — Telephone Encounter (Signed)
Pharmacy is calling about patient's fish oil rx that we called in Friday. They wanted verification on what medication pt needed to be on.

## 2010-09-25 NOTE — Telephone Encounter (Signed)
She just needs over the counter fish oil 2000 mg daily equivalent.

## 2010-09-26 NOTE — Telephone Encounter (Signed)
Called pharmacy and left them know it's over the counter fish oil. Will contact patient to let her know.

## 2010-09-26 NOTE — Telephone Encounter (Signed)
[facility], pt notified to take OTC fish oil 2000mg  daily. Pt is to call me back with any questions or concerns.

## 2010-09-26 NOTE — Telephone Encounter (Signed)
Already notified of pharmacy, pt is to take OTC fish oil 2000mg  qd per Dr.Mclean.

## 2011-05-03 ENCOUNTER — Ambulatory Visit: Payer: Self-pay | Admitting: Family Medicine

## 2011-05-03 ENCOUNTER — Ambulatory Visit (INDEPENDENT_AMBULATORY_CARE_PROVIDER_SITE_OTHER): Payer: BC Managed Care – PPO | Admitting: Family Medicine

## 2011-05-03 ENCOUNTER — Encounter: Payer: Self-pay | Admitting: Family Medicine

## 2011-05-03 VITALS — BP 120/78 | HR 86 | Temp 99.2°F | Ht 68.0 in | Wt 327.0 lb

## 2011-05-03 DIAGNOSIS — M5416 Radiculopathy, lumbar region: Secondary | ICD-10-CM

## 2011-05-03 DIAGNOSIS — IMO0002 Reserved for concepts with insufficient information to code with codable children: Secondary | ICD-10-CM

## 2011-05-03 DIAGNOSIS — H5704 Mydriasis: Secondary | ICD-10-CM

## 2011-05-03 DIAGNOSIS — R51 Headache: Secondary | ICD-10-CM

## 2011-05-03 MED ORDER — PREDNISONE 20 MG PO TABS: ORAL_TABLET | ORAL | Status: DC

## 2011-05-03 MED ORDER — HYDROCODONE-ACETAMINOPHEN 5-500 MG PO TABS: 1.0000 | ORAL_TABLET | Freq: Four times a day (QID) | ORAL | Status: AC | PRN

## 2011-05-03 MED ORDER — CYCLOBENZAPRINE HCL 10 MG PO TABS: 10.0000 mg | ORAL_TABLET | Freq: Two times a day (BID) | ORAL | Status: DC | PRN

## 2011-05-03 MED ORDER — ALPRAZOLAM 1 MG PO TABS: ORAL_TABLET | ORAL | Status: DC

## 2011-05-03 NOTE — Progress Notes (Signed)
Patient Name: Danielle Irwin Date of Birth: 08-05-1970 Age: 40 y.o. Medical Record Number: [id number] Gender: female  History of Present Illness:  Danielle Irwin is a 40 y.o. very pleasant female patient who presents with the following:  Low back is bothering her. Had a bulging disk in 1992. Had some back pain and some radic - had a ESI on the left in the past. Right now on the right side. She had an epidural steroid injection in the early 2000 was done at [facility], and she was seeing Dr. Jillyn Hidden for [facility] orthopedics. She has done quite well since that time, but now has return of significant low back pain acutely, and radiculopathy.  Headaches and will also have left-sided pupillary dilation intermittently. She has been having headaches progressively for more than a year and they have been worsening over that time period she occasionally will have some problems with light or sound, and she does have a history of migraines. She has never had any brain imaging. No trauma to the face, eye, or head. No other memory problems, facial drooping, or other neurological complaints. No weakness. No sensory deficit. Intermittently dilated pupils. Pain in the left side of head and also the pupils.  Tylenol - 3000-4000 mg a day Motrin two or three times a day.  Sometimes light will hurt eyes    Past Medical History, Surgical History, Social History, Family History, and Problem List have been reviewed in EHR and updated if relevant.  Review of Systems:   GEN: No fevers, chills. Nontoxic. Primarily MSK c/o today. MSK: Detailed in the HPI GI: tolerating PO intake without difficulty Neuro: detailed above Otherwise the pertinent positives of the ROS are noted above.  No chest pain. No shortness of breath.  Physical Examination: Filed Vitals:   05/03/11 1400  BP: 120/78  Pulse: 86  Temp: 99.2 F (37.3 C)  TempSrc: Oral  Height: 5\' 8"  (1.727 m)  Weight: 327 lb (148.326 kg)  SpO2: 99%       GEN: WDWN, NAD, Non-toxic, A & O x 3 HEENT: Atraumatic, Normocephalic. Neck supple. No masses, No LAD. Ears and Nose: No external deformity. CV: RRR, No M/G/R. No JVD. No thrill. No extra heart sounds. PULM: CTA B, no wheezes, crackles, rhonchi. No retractions. No resp. distress. No accessory muscle use. ABD: S, NT, ND, +BS. No rebound tenderness. No HSM.  EXTR: No c/c/e MSK: mid lumbar spine from L3-S1 are tender to palpation, and the patient also has tenderness in the perivertebral area surrounding this. Notably tender also in the SI joints, but more so on the RIGHT. Nontender at the greater trochanter bursa. Negative straight leg raise. Normal hip rotation. L4-S1 are intact from a motor and sensory standpoint. Deep tendon reflexes are 2+ throughout. Gross and pinprick sensation are intact and normal.  Neuro: CN 2-12 grossly intact. The patient's LEFT pupil is notably more dilated compared to the RIGHT, but is reactive. Equally reactive compared to the opposite side.. EOMI. Sensation intact throughout. Str 5/5 all extremities. DTR 2+. No clonus. A and o x 4. Romberg neg. Finger nose neg. Heel -shin neg.   PSYCH: Normally interactive. Conversant. Not depressed or anxious appearing.  Calm demeanor.     Assessment and Plan: 1. Lumbar radiculopathy, acute  cyclobenzaprine (FLEXERIL) 10 MG tablet, HYDROcodone-acetaminophen (VICODIN) 5-500 MG per tablet, DG Lumbar Spine Complete, predniSONE (DELTASONE) 20 MG tablet, DISCONTINUED: predniSONE (DELTASONE) 20 MG tablet  2. Headache  MR Brain Wo Contrast  3. Pupil  dilation  MR Brain Wo Contrast    Low-back pain with radiculopathy. No dysmotility. Suspected there is some mild degree of nerve impairment causing radiculopathy. Basic range of motion, steroids, muscle relaxants.  Of greater concern is her abnormal neurological exam and dilated pupil on the LEFT, which is a new phenomenon.  She is also having escalating headaches in intensity.  This needs to be evaluated from a imaging standpoint, and we'll obtain an MRI of the brain without contrast to evaluate the patient's brain and soft tissue structures.

## 2011-05-03 NOTE — Patient Instructions (Signed)
REFERRAL: GO THE THE FRONT ROOM AT THE ENTRANCE OF OUR CLINIC, NEAR CHECK IN. ASK FOR MARION. SHE WILL HELP YOU SET UP YOUR REFERRAL. DATE: TIME:  

## 2011-05-06 ENCOUNTER — Ambulatory Visit: Payer: Self-pay | Admitting: Family Medicine

## 2011-05-07 ENCOUNTER — Encounter: Payer: Self-pay | Admitting: Family Medicine

## 2011-05-07 ENCOUNTER — Ambulatory Visit
Admission: RE | Admit: 2011-05-07 | Discharge: 2011-05-07 | Disposition: A | Discharge status: Home / Self Care | Payer: BC Managed Care – PPO | Source: Ambulatory Visit | Attending: Family Medicine | Admitting: Family Medicine

## 2011-05-07 DIAGNOSIS — H5704 Mydriasis: Secondary | ICD-10-CM

## 2011-05-07 DIAGNOSIS — R51 Headache: Secondary | ICD-10-CM

## 2011-05-09 NOTE — Telephone Encounter (Signed)
Can you please close this encounter

## 2011-06-12 ENCOUNTER — Telehealth: Payer: Self-pay | Admitting: Internal Medicine

## 2011-06-12 NOTE — Telephone Encounter (Signed)
error 

## 2011-06-14 ENCOUNTER — Encounter: Payer: Self-pay | Admitting: Family Medicine

## 2011-06-14 ENCOUNTER — Ambulatory Visit (INDEPENDENT_AMBULATORY_CARE_PROVIDER_SITE_OTHER): Payer: BC Managed Care – PPO | Admitting: Family Medicine

## 2011-06-14 VITALS — BP 122/78 | HR 96 | Temp 98.7°F | Resp 20 | Ht 68.0 in | Wt 326.2 lb

## 2011-06-14 DIAGNOSIS — J329 Chronic sinusitis, unspecified: Secondary | ICD-10-CM

## 2011-06-14 DIAGNOSIS — J40 Bronchitis, not specified as acute or chronic: Secondary | ICD-10-CM | POA: Insufficient documentation

## 2011-06-14 MED ORDER — AMOXICILLIN-POT CLAVULANATE 875-125 MG PO TABS: 1.0000 | ORAL_TABLET | Freq: Two times a day (BID) | ORAL | Status: AC

## 2011-06-14 NOTE — Patient Instructions (Signed)
You have a sinus infection. Take medicine as prescribed: will treat with augmentin twice daily for 10 days Push fluids and plenty of rest. Nasal saline irrigation or neti pot to help drain sinuses. May use simple mucinex with plenty of fluid to help mobilize mucous. Let us know if fever >101.5, trouble opening/closing mouth, difficulty swallowing, or worsening - you may need to be seen again.

## 2011-06-14 NOTE — Assessment & Plan Note (Addendum)
Evident sinus infection duration >10 days.  Will treat with augmentin x 10 days. cheratussin for cough. Update [place] if not improving after this. states has tolerated augmentin in past.

## 2011-06-14 NOTE — Progress Notes (Signed)
  Subjective:    Patient ID: Danielle Irwin, female    DOB: 1971/01/19, 41 y.o.   MRN: [id number]  HPI CC: cold?  2 mo h/o recurrent illness.  Last week had fever to 102, diarrhea for 3 days.  Felt ok for 4 days then started getting sick again.  Fever to 101 2d ago.  Having facial pressure, continued ST worse on L>R.  Keeps dull headache.  Thick yellow sputum with blowing nose.  Coughing intermittent, yellow sputum comes up.  Cough currently better.  Sinus congestion keeping her up.  No abd pain, nausea/vomiting, tooth pain.  Also taking echinacea and vit C.  Taking nyquil, tylenol, guaifenesin.  + mom sick and smokes at home (outside).  H/o asthma and chronic bronchitis.  Last abx were early December ([facility]) placed on cipro for 7 days.  Back bothering her - h/o 2 bulging discs in lower back.  Prior on prednisone but wore off.  Only able to take flexeril and hydrocodone (provided by Dr. Salena Saner) at night because makes her too loopy.  Able to take anti inflammatories but don't really help.  Review of Systems Per HPI    Objective:   Physical Exam  Nursing note and vitals reviewed. Constitutional: She appears well-developed and well-nourished. No distress.  HENT:  Head: Normocephalic and atraumatic.  Right Ear: Hearing, tympanic membrane, external ear and ear canal normal.  Left Ear: Hearing, tympanic membrane, external ear and ear canal normal.  Nose: No mucosal edema or rhinorrhea. Right sinus exhibits maxillary sinus tenderness and frontal sinus tenderness. Left sinus exhibits maxillary sinus tenderness and frontal sinus tenderness.  Mouth/Throat: Uvula is midline, oropharynx is clear and moist and mucous membranes are normal. No oropharyngeal exudate, posterior oropharyngeal edema, posterior oropharyngeal erythema or tonsillar abscesses.  Eyes: Conjunctivae and EOM are normal. Pupils are equal, round, and reactive to light. No scleral icterus.  Neck: Normal range of motion. Neck supple. No JVD  present. No thyromegaly present.  Cardiovascular: Normal rate, regular rhythm, normal heart sounds and intact distal pulses.   No murmur heard. Pulmonary/Chest: Effort normal and breath sounds normal. No respiratory distress. She has no wheezes. She has no rales.  Lymphadenopathy:    She has no cervical adenopathy.  Skin: Skin is warm and dry. No rash noted.      Assessment & Plan:

## 2011-09-04 ENCOUNTER — Encounter: Payer: Self-pay | Admitting: Family Medicine

## 2011-09-04 ENCOUNTER — Ambulatory Visit (INDEPENDENT_AMBULATORY_CARE_PROVIDER_SITE_OTHER): Payer: BC Managed Care – PPO | Admitting: Family Medicine

## 2011-09-04 VITALS — BP 130/86 | HR 88 | Temp 98.5°F | Wt 342.2 lb

## 2011-09-04 DIAGNOSIS — J40 Bronchitis, not specified as acute or chronic: Secondary | ICD-10-CM

## 2011-09-04 MED ORDER — GUAIFENESIN-CODEINE 100-10 MG/5ML PO SYRP: 5.0000 mL | ORAL_SOLUTION | Freq: Every evening | ORAL | Status: DC | PRN

## 2011-09-04 MED ORDER — AZITHROMYCIN 250 MG PO TABS: ORAL_TABLET | ORAL | Status: AC

## 2011-09-04 NOTE — Progress Notes (Signed)
  Subjective:    Patient ID: Danielle Irwin, female    DOB: 1970/06/16, 41 y.o.   MRN: [id number]  HPI CC: cough, congestion.  3wk h/o cough.  Started as head cold, now down in lungs.  Getting worse.  Neck pain from coughing, wonders if pulled muscle.  Low grade fever.  L ear pain present.    So far has tried mucinex and delxym.  Also using albuterol inhaler as needed.  No high fevers/chills, abd pain, n/v, tooth pain.  + sick contacts at home.  Mother smokes outside.  + h/o asthma.  Currently feeling some wheezy.  Review of Systems Per HPI    Objective:   Physical Exam  Nursing note and vitals reviewed. Constitutional: She appears well-developed and well-nourished. No distress.       Hoarse cough present  HENT:  Head: Normocephalic and atraumatic.  Right Ear: External ear normal.  Left Ear: External ear normal.  Nose: No mucosal edema or rhinorrhea. Right sinus exhibits no maxillary sinus tenderness and no frontal sinus tenderness. Left sinus exhibits no maxillary sinus tenderness and no frontal sinus tenderness.  Mouth/Throat: Uvula is midline, oropharynx is clear and moist and mucous membranes are normal. No oropharyngeal exudate.  Eyes: Conjunctivae and EOM are normal. Pupils are equal, round, and reactive to light. No scleral icterus.  Neck: Normal range of motion. Neck supple.  Cardiovascular: Normal rate, regular rhythm, normal heart sounds and intact distal pulses.   No murmur heard. Pulmonary/Chest: Effort normal and breath sounds normal. No respiratory distress. She has no rales.       Minimal R basilar wheezing  Musculoskeletal: She exhibits no edema.  Lymphadenopathy:    She has no cervical adenopathy.  Skin: Skin is warm and dry. No rash noted.  Psychiatric: She has a normal mood and affect.       Assessment & Plan:

## 2011-09-04 NOTE — Patient Instructions (Signed)
Sounds like you have a bronchitis. Use medication as prescribed: zpack and cheratussin Push fluids and plenty of rest. Please return if you are not improving as expected, or if you have high fevers (>101.5) or difficulty swallowing or worsening productive cough. Call clinic with questions.  Good to see you today.  Bronchitis Bronchitis is the body's way of reacting to injury and/or infection (inflammation) of the bronchi. Bronchi are the air tubes that extend from the windpipe into the lungs. If the inflammation becomes severe, it may cause shortness of breath. CAUSES  Inflammation may be caused by:  A virus.   Germs (bacteria).   Dust.   Allergens.   Pollutants and many other irritants.  The cells lining the bronchial tree are covered with tiny hairs (cilia). These constantly beat upward, away from the lungs, toward the mouth. This keeps the lungs free of pollutants. When these cells become too irritated and are unable to do their job, mucus begins to develop. This causes the characteristic cough of bronchitis. The cough clears the lungs when the cilia are unable to do their job. Without either of these protective mechanisms, the mucus would settle in the lungs. Then you would develop pneumonia. Smoking is a common cause of bronchitis and can contribute to pneumonia. Stopping this habit is the single most important thing you can do to help yourself. TREATMENT   Your caregiver may prescribe an antibiotic if the cough is caused by bacteria. Also, medicines that open up your airways make it easier to breathe. Your caregiver may also recommend or prescribe an expectorant. It will loosen the mucus to be coughed up. Only take over-the-counter or prescription medicines for pain, discomfort, or fever as directed by your caregiver.   Removing whatever causes the problem (smoking, for example) is critical to preventing the problem from getting worse.   Cough suppressants may be prescribed for  relief of cough symptoms.   Inhaled medicines may be prescribed to help with symptoms now and to help prevent problems from returning.   For those with recurrent (chronic) bronchitis, there may be a need for steroid medicines.  SEEK IMMEDIATE MEDICAL CARE IF:   During treatment, you develop more pus-like mucus (purulent sputum).   You have a fever.   Your baby is older than 3 months with a rectal temperature of 102 F (38.9 C) or higher.   Your baby is 46 months old or younger with a rectal temperature of 100.4 F (38 C) or higher.   You become progressively more ill.   You have increased difficulty breathing, wheezing, or shortness of breath.  It is necessary to seek immediate medical care if you are elderly or sick from any other disease. MAKE SURE YOU:   Understand these instructions.   Will watch your condition.   Will get help right away if you are not doing well or get worse.  Document Released: 05/27/2005 Document Revised: 05/16/2011 Document Reviewed: 04/05/2008 [facility] Patient Information 2012 [place], [place].

## 2011-09-04 NOTE — Assessment & Plan Note (Signed)
Given duration and sxs anticipate acute bronchitis in h/o chronic bronchitis. Cover atypicals with zpack. cheratussin for cough, update [place] if not improving or if any worsening.

## 2011-09-10 ENCOUNTER — Telehealth: Payer: Self-pay

## 2011-09-10 MED ORDER — HYDROCOD POLST-CHLORPHEN POLST 10-8 MG/5ML PO LQCR: 5.0000 mL | Freq: Every evening | ORAL | Status: DC | PRN

## 2011-09-10 NOTE — Telephone Encounter (Signed)
May send in stronger cough syrup for night time - tussionex. zpack stays in system for prolonged time, would give this more time. If not better towards end of week, recommend pt come in to be seen again.

## 2011-09-10 NOTE — Telephone Encounter (Signed)
Pt saw Dr Sharen Hones on 09/04/11. Pt finished z pak and cheratussin not really helping cough. Pt said productive cough with yellow phlegm is no worse that when seen on 09/04/11 but it is no better either.Pt said may have low grade fever(pt has not taken temp) Pt has some wheezing but using inhaler for that. No SOB. Pt uses [facility] [address]  And can be reached at [phone].

## 2011-09-11 ENCOUNTER — Telehealth: Payer: Self-pay | Admitting: Family Medicine

## 2011-09-11 NOTE — Telephone Encounter (Signed)
Caller: Danielle Irwin/Patient; PCP: Eustaquio Boyden; CB#: [phone]; Call regarding Cough. Pt still has a cough after being prescribed Zithromax for bronchitis. Last dose on 09/07/11. She ? if a stronger abx can be called in. RN advised pt the Zithromax is still in her system as is noted in EPIC per Dr. Reece Agar. She would still like an appt for tomorrow as this is her only day off work. Emergent sx r/o. Appt sched for 09/12/11 @ 1145 with Dr. Dayton Martes per pt request.

## 2011-09-11 NOTE — Telephone Encounter (Signed)
Attempted to contact patient again. Mailbox still full and unable to leave message. Will try again later.

## 2011-09-11 NOTE — Telephone Encounter (Signed)
Rx called into pharmacy. Attempted to call patient-mailbox full. Unable to leave message. Will try again later.

## 2011-09-12 ENCOUNTER — Ambulatory Visit (INDEPENDENT_AMBULATORY_CARE_PROVIDER_SITE_OTHER): Payer: BC Managed Care – PPO | Admitting: Family Medicine

## 2011-09-12 ENCOUNTER — Encounter: Payer: Self-pay | Admitting: Family Medicine

## 2011-09-12 VITALS — BP 124/82 | HR 84 | Temp 98.0°F | Wt 340.0 lb

## 2011-09-12 DIAGNOSIS — J40 Bronchitis, not specified as acute or chronic: Secondary | ICD-10-CM

## 2011-09-12 MED ORDER — HYDROCOD POLST-CHLORPHEN POLST 10-8 MG/5ML PO LQCR: 5.0000 mL | Freq: Two times a day (BID) | ORAL | Status: DC | PRN

## 2011-09-12 MED ORDER — BENZONATATE 100 MG PO CAPS: 100.0000 mg | ORAL_CAPSULE | Freq: Three times a day (TID) | ORAL | Status: AC | PRN

## 2011-09-12 NOTE — Telephone Encounter (Signed)
Attempted to call patient again. Mailbox still full. Unable to leave message. Will await return call from patient.

## 2011-09-12 NOTE — Telephone Encounter (Signed)
Pt was seen by Dr. Dayton Martes this morning.

## 2011-09-12 NOTE — Patient Instructions (Signed)
Continue Mucinex and your inhaler. Tessalon 100 mg - up to three times daily for cough along with Tussionex twice daily as needed for cough. Keep me posted with your symptoms.

## 2011-09-12 NOTE — Progress Notes (Signed)
  Subjective:    Patient ID: Danielle Irwin, female    DOB: [birth year], 41 y.o.   MRN: [id number]  HPI 41 yo here for persistent cough. Saw Dr. Reece Agar on 3/27 for 3 week h/o productive cough, low grade temp.  Given Zpack and Cheratussin. Here today because congestion has improved but cough is worse. Coughed so hard yesterday, she became incontinent of her bowels at work.   Does have h/o sick contacts and asthma.  Review of Systems Per HPI   No recent fever. No nausea or vomiting. No CP or SOB. Objective:   Physical Exam  BP 124/82  Pulse 84  Temp(Src) 98 F (36.7 C) (Oral)  Wt 340 lb (154.223 kg)  LMP 08/25/2011  Nursing note and vitals reviewed. Constitutional: She appears well-developed and well-nourished. No distress.       Hoarse cough present  HENT:  Head: Normocephalic and atraumatic.  Right Ear: External ear normal.  Left Ear: External ear normal.  Nose: No mucosal edema or rhinorrhea. Right sinus exhibits no maxillary sinus tenderness and no frontal sinus tenderness. Left sinus exhibits no maxillary sinus tenderness and no frontal sinus tenderness.  Mouth/Throat: Uvula is midline, oropharynx is clear and moist and mucous membranes are normal. No oropharyngeal exudate.  Eyes: Conjunctivae and EOM are normal. Pupils are equal, round, and reactive to light. No scleral icterus.  Neck: Normal range of motion. Neck supple.  Cardiovascular: Normal rate, regular rhythm, normal heart sounds and intact distal pulses.   No murmur heard. Pulmonary/Chest: Effort normal and breath sounds normal. No respiratory distress. She has no rales. No wheezes   Musculoskeletal: She exhibits no edema.  Lymphadenopathy:    She has no cervical adenopathy.  Skin: Skin is warm and dry. No rash noted.  Psychiatric: She has a normal mood and affect.       Assessment & Plan:   1. Bronchitis    Improving. Will start Tussionex and tessalon as needed for cough. Continue supportive care as per pt  instructions.

## 2012-02-13 ENCOUNTER — Encounter: Payer: Self-pay | Admitting: Family Medicine

## 2012-02-13 ENCOUNTER — Ambulatory Visit
Admission: RE | Admit: 2012-02-13 | Discharge: 2012-02-13 | Disposition: A | Discharge status: Home / Self Care | Payer: BC Managed Care – PPO | Source: Ambulatory Visit | Attending: Family Medicine | Admitting: Family Medicine

## 2012-02-13 ENCOUNTER — Ambulatory Visit: Payer: Self-pay | Admitting: Family Medicine

## 2012-02-13 ENCOUNTER — Ambulatory Visit (INDEPENDENT_AMBULATORY_CARE_PROVIDER_SITE_OTHER): Payer: BC Managed Care – PPO | Admitting: Family Medicine

## 2012-02-13 VITALS — BP 100/70 | HR 83 | Temp 97.9°F | Wt 335.0 lb

## 2012-02-13 DIAGNOSIS — M5416 Radiculopathy, lumbar region: Secondary | ICD-10-CM

## 2012-02-13 DIAGNOSIS — IMO0002 Reserved for concepts with insufficient information to code with codable children: Secondary | ICD-10-CM

## 2012-02-13 MED ORDER — DIAZEPAM 5 MG PO TABS: ORAL_TABLET | ORAL | Status: DC

## 2012-02-13 NOTE — Patient Instructions (Addendum)
Great to see you. I will call you with your xray results and set up your MRI. Please let me know how the valium is working in a few days.

## 2012-02-13 NOTE — Progress Notes (Signed)
Danielle Irwin is a 41 y.o. very pleasant female patient who presents with the following:  Low back pain-   Had a bulging disk in 1992. Had some back pain and some radic - had a ESI on the left in the past. Right now on the right side. She had an epidural steroid injection in the early 2000 was done at [facility], and she was seeing Dr. Jillyn Hidden for [facility].   Pain returned in November 2012- saw Dr. Patsy Lager (see lumbar xray ).    Did ok after that time until June.  Now she has constant right sided pain with right sided radiculopathy.  Flexeril used to help but no longer helping.  Patient Active Problem List  Diagnosis  . POLYCYSTIC OVARIAN DISEASE  . BRONCHITIS, CHRONIC  . MUSCLE SPASM, TRAPEZIUS MUSCLE, LEFT  . FATIGUE  . SHORTNESS OF BREATH  . CHEST PAIN UNSPECIFIED  . ECHOCARDIOGRAM, ABNORMAL  . Bronchitis  . Lumbar radicular pain   Past Medical History  Diagnosis Date  . Bronchitis, chronic     ? asthma  . OA (osteoarthritis)   . PCOS (polycystic ovarian syndrome)   . Depression   . Obesity   . Vitamin d deficiency    No past surgical history on file. History  Substance Use Topics  . Smoking status: Never Smoker   . Smokeless tobacco: Never Used  . Alcohol Use: No   Family History  Problem Relation Age of Onset  . Heart attack Father 52   Allergies  Allergen Reactions  . Bee Venom     REACTION: Hives and Swelling  . Bupropion Hcl     REACTION: Mood Changes  . Cephalexin     REACTION: Hives and Swelling   Current Outpatient Prescriptions on File Prior to Visit  Medication Sig Dispense Refill  . albuterol (VENTOLIN HFA) 108 (90 BASE) MCG/ACT inhaler Inhale 2 puffs into the lungs every 6 (six) hours as needed.        . calcium carbonate (TUMS - DOSED IN MG ELEMENTAL CALCIUM) 500 MG chewable tablet Chew 1 tablet by mouth daily.        . cetirizine-pseudoephedrine (ZYRTEC-D ALLERGY & CONGESTION) 5-120 MG per tablet Take 1 tablet by mouth  daily.       . Cholecalciferol (VITAMIN D) 2000 UNITS CAPS Take 1 capsule by mouth daily.        . cyclobenzaprine (FLEXERIL) 10 MG tablet Take 1 tablet (10 mg total) by mouth 2 (two) times daily as needed.  50 tablet  2  . etonogestrel-ethinyl estradiol (NUVARING) 0.12-0.015 MG/24HR vaginal ring Place 1 each vaginally every 28 (twenty-eight) days. Insert vaginally and leave in place for 3 consecutive weeks, then remove for 1 week.       . fluticasone (FLONASE) 50 MCG/ACT nasal spray 2 sprays by Nasal route daily.        . Multiple Vitamin (MULTIVITAMIN) tablet Take 1 tablet by mouth daily.        Satira Sark Johns Wort 300 MG CAPS Take 2 capsules by mouth daily. And one at night       . DISCONTD: fluticasone (FLOVENT HFA) 110 MCG/ACT inhaler Inhale 1 puff into the lungs 2 (two) times daily.  1 Inhaler  12       Past Medical History, Surgical History, Social History, Family History, and Problem List have been reviewed in EHR and updated if relevant.  Review of Systems:   GEN: No fevers, chills. Nontoxic. Primarily MSK  c/o today. MSK: Detailed in the HPI GI: tolerating PO intake without difficulty Neuro: detailed above Otherwise the pertinent positives of the ROS are noted above.  No chest pain. No shortness of breath.  Physical Examination: Filed Vitals:   02/13/12 1004  BP: 100/70  Pulse: 83  Temp: 97.9 F (36.6 C)  Weight: 335 lb (151.955 kg)     GEN: WDWN, NAD, Non-toxic, A & O x 3 HEENT: Atraumatic, Normocephalic. Neck supple. No masses, No LAD. Ears and Nose: No external deformity. CV: RRR, No M/G/R. No JVD. No thrill. No extra heart sounds. PULM: CTA B, no wheezes, crackles, rhonchi. No retractions. No resp. distress. No accessory muscle use. ABD: S, NT, ND, +BS. No rebound tenderness. No HSM.  EXTR: No c/c/e MSK: mid lumbar spine from L3-S1 are tender to palpation, and the patient also has tenderness in the perivertebral area surrounding this.  Pos SLR right, neg  fabers Neg SLR left PSYCH: Normally interactive. Conversant. Not depressed or anxious appearing.  Calm demeanor.     Assessment and Plan: 1. Lumbar radicular pain  DG Lumbar Spine Complete, DG Lumbar Spine Complete   with worsening radiculopathy- will repeat XRAY today. Start valium as flexeril not helping, work on exercises. Will likely need to proceed with steroids and or MRI. Will discuss with patient after xray.

## 2012-02-14 ENCOUNTER — Encounter: Payer: Self-pay | Admitting: Family Medicine

## 2012-02-18 ENCOUNTER — Other Ambulatory Visit: Payer: Self-pay | Admitting: Family Medicine

## 2012-02-18 DIAGNOSIS — M5416 Radiculopathy, lumbar region: Secondary | ICD-10-CM

## 2012-05-12 ENCOUNTER — Encounter: Payer: Self-pay | Admitting: Specialist

## 2012-06-10 ENCOUNTER — Encounter: Payer: Self-pay | Admitting: Specialist

## 2012-07-11 ENCOUNTER — Encounter: Payer: Self-pay | Admitting: Specialist

## 2013-03-11 ENCOUNTER — Encounter: Payer: Self-pay | Admitting: Cardiology

## 2013-04-19 ENCOUNTER — Ambulatory Visit (INDEPENDENT_AMBULATORY_CARE_PROVIDER_SITE_OTHER): Payer: BC Managed Care – PPO | Admitting: Internal Medicine

## 2013-04-19 ENCOUNTER — Encounter: Payer: Self-pay | Admitting: Internal Medicine

## 2013-04-19 VITALS — BP 122/76 | HR 96 | Temp 98.7°F | Ht 68.0 in | Wt 333.8 lb

## 2013-04-19 DIAGNOSIS — J209 Acute bronchitis, unspecified: Secondary | ICD-10-CM

## 2013-04-19 MED ORDER — PREDNISONE 10 MG PO TABS: ORAL_TABLET | ORAL | Status: DC

## 2013-04-19 MED ORDER — BENZONATATE 100 MG PO CAPS: 100.0000 mg | ORAL_CAPSULE | Freq: Two times a day (BID) | ORAL | Status: DC | PRN

## 2013-04-19 MED ORDER — ALBUTEROL SULFATE HFA 108 (90 BASE) MCG/ACT IN AERS: 2.0000 | INHALATION_SPRAY | Freq: Four times a day (QID) | RESPIRATORY_TRACT | Status: DC | PRN

## 2013-04-19 MED ORDER — AZITHROMYCIN 250 MG PO TABS: ORAL_TABLET | ORAL | Status: DC

## 2013-04-19 NOTE — Progress Notes (Signed)
Pre-visit discussion using our clinic review tool. No additional management support is needed unless otherwise documented below in the visit note.  

## 2013-04-19 NOTE — Progress Notes (Signed)
HPI  Pt presents to the clinic today with c/o hacking cough. This started about 5-6 days ago. The cough is not productive. She does report fever, chills and body aches. She has tried Mucinex and Delsym OTC without much relief. She has not had sick contacts that she is aware of. Of note, she does have a new cat in the house and is not sure if this triggered her symptoms. She does have a history of chronic bronchitis. She does not take her inhalers as prescribed.  Review of Systems      Past Medical History  Diagnosis Date  . Bronchitis, chronic     ? asthma  . OA (osteoarthritis)   . PCOS (polycystic ovarian syndrome)   . Depression   . Obesity   . Vitamin D deficiency     Family History  Problem Relation Age of Onset  . Heart attack Father 10    History   Social History  . Marital Status: Single    Spouse Name: N/A    Number of Children: N/A  . Years of Education: N/A   Occupational History  . Not on file.   Social History Main Topics  . Smoking status: Never Smoker   . Smokeless tobacco: Never Used  . Alcohol Use: No  . Drug Use: No  . Sexual Activity: Not on file   Other Topics Concern  . Not on file   Social History Narrative  . No narrative on file    Allergies  Allergen Reactions  . Bee Venom     REACTION: Hives and Swelling  . Bupropion Hcl     REACTION: Mood Changes  . Cephalexin     REACTION: Hives and Swelling     Constitutional: Positive headache, fatigue and fever. Denies abrupt weight changes.  HEENT:  Positive sore throat. Denies eye redness, eye pain, pressure behind the eyes, facial pain, nasal congestion, ear pain, ringing in the ears, wax buildup, runny nose or bloody nose. Respiratory: Positive cough, shortness of breath.  Cardiovascular: Denies chest pain, chest tightness, palpitations or swelling in the hands or feet.   No other specific complaints in a complete review of systems (except as listed in HPI above).  Objective:    BP 122/76  Pulse 96  Temp(Src) 98.7 F (37.1 C) (Oral)  Ht 5\' 8"  (1.727 m)  Wt 333 lb 12 oz (151.388 kg)  BMI 50.76 kg/m2  LMP 04/14/2013 Wt Readings from Last 3 Encounters:  04/19/13 333 lb 12 oz (151.388 kg)  02/13/12 335 lb (151.955 kg)  09/12/11 340 lb (154.223 kg)     General: Appears her stated age, obese but well developed, well nourished in NAD. HEENT: Head: normal shape and size; Eyes: sclera white, no icterus, conjunctiva pink, PERRLA and EOMs intact; Ears: Tm's gray and intact, normal light reflex; Nose: mucosa pink and moist, septum midline; Throat/Mouth: + PND. Teeth present, mucosa erythematous and moist, no exudate noted, no lesions or ulcerations noted.  Neck: Neck supple, trachea midline. No massses, lumps or thyromegaly present.  Cardiovascular: Normal rate and rhythm. S1,S2 noted.  No murmur, rubs or gallops noted. No JVD or BLE edema. No carotid bruits noted. Pulmonary/Chest: Normal effort and bilateral wheezing noted. No respiratory distress. No rales or ronchi noted.      Assessment & Plan:   Acute on chronic bronchitis:  Get some rest and drink plenty of water Do salt water gargles for the sore throat eRx for pred taper x 6 days eRx  for Azithromax x 5 days if no relief from pred taper in 3-5 days eRx for tessalon pearles and refilled albuterol inhaler   RTC as needed or if symptoms persist.

## 2013-04-19 NOTE — Patient Instructions (Signed)
Acute Bronchitis Bronchitis is inflammation of the airways that extend from the windpipe into the lungs (bronchi). The inflammation often causes mucus to develop. This leads to a cough, which is the most common symptom of bronchitis.  In acute bronchitis, the condition usually develops suddenly and goes away over time, usually in a couple weeks. Smoking, allergies, and asthma can make bronchitis worse. Repeated episodes of bronchitis may cause further lung problems.  CAUSES Acute bronchitis is most often caused by the same virus that causes a cold. The virus can spread from person to person (contagious).  SIGNS AND SYMPTOMS   Cough.   Fever.   Coughing up mucus.   Body aches.   Chest congestion.   Chills.   Shortness of breath.   Sore throat.  DIAGNOSIS  Acute bronchitis is usually diagnosed through a physical exam. Tests, such as chest X-rays, are sometimes done to rule out other conditions.  TREATMENT  Acute bronchitis usually goes away in a couple weeks. Often times, no medical treatment is necessary. Medicines are sometimes given for relief of fever or cough. Antibiotics are usually not needed but may be prescribed in certain situations. In some cases, an inhaler may be recommended to help reduce shortness of breath and control the cough. A cool mist vaporizer may also be used to help thin bronchial secretions and make it easier to clear the chest.  HOME CARE INSTRUCTIONS  Get plenty of rest.   Drink enough fluids to keep your urine clear or pale yellow (unless you have a medical condition that requires fluid restriction). Increasing fluids may help thin your secretions and will prevent dehydration.   Only take over-the-counter or prescription medicines as directed by your health care provider.   Avoid smoking and secondhand smoke. Exposure to cigarette smoke or irritating chemicals will make bronchitis worse. If you are a smoker, consider using nicotine gum or skin  patches to help control withdrawal symptoms. Quitting smoking will help your lungs heal faster.   Reduce the chances of another bout of acute bronchitis by washing your hands frequently, avoiding people with cold symptoms, and trying not to touch your hands to your mouth, nose, or eyes.   Follow up with your health care provider as directed.  SEEK MEDICAL CARE IF: Your symptoms do not improve after 1 week of treatment.  SEEK IMMEDIATE MEDICAL CARE IF:  You develop an increased fever or chills.   You have chest pain.   You have severe shortness of breath.  You have bloody sputum.   You develop dehydration.  You develop fainting.  You develop repeated vomiting.  You develop a severe headache. MAKE SURE YOU:   Understand these instructions.  Will watch your condition.  Will get help right away if you are not doing well or get worse. Document Released: 07/04/2004 Document Revised: 01/27/2013 Document Reviewed: 11/17/2012 [employer] Patient Information 2014 [employer].  

## 2013-04-19 NOTE — Progress Notes (Signed)
HPI: Pt presents to the office today with complaints of cough and fatigue that started last Wednesday. Pt endorses cough, wheezing, shortness of breath, chest tightness, sinus pressure, nasal congestion, fever, and fatigue. Pt denies chest pain or nasal discharge. Pt currently does not use prescribed albuterol for chronic bronchitis. Pt tried taking Mucinex and Delsom cough syrup with minimal relief.   Past Medical History  Diagnosis Date  . Bronchitis, chronic     ? asthma  . OA (osteoarthritis)   . PCOS (polycystic ovarian syndrome)   . Depression   . Obesity   . Vitamin D deficiency     Current Outpatient Prescriptions  Medication Sig Dispense Refill  . calcium carbonate (TUMS - DOSED IN MG ELEMENTAL CALCIUM) 500 MG chewable tablet Chew 1 tablet by mouth daily.        . cetirizine-pseudoephedrine (ZYRTEC-D ALLERGY & CONGESTION) 5-120 MG per tablet Take 1 tablet by mouth daily.       . Cholecalciferol (VITAMIN D) 2000 UNITS CAPS Take 1 capsule by mouth daily.        . cyclobenzaprine (FLEXERIL) 10 MG tablet Take 1 tablet (10 mg total) by mouth 2 (two) times daily as needed.  50 tablet  2  . fluticasone (FLONASE) 50 MCG/ACT nasal spray 2 sprays by Nasal route daily.        . fluticasone (FLOVENT HFA) 110 MCG/ACT inhaler Inhale 1 puff into the lungs 2 (two) times daily.      . Multiple Vitamin (MULTIVITAMIN) tablet Take 1 tablet by mouth daily.        . norethindrone-ethinyl estradiol-iron (MICROGESTIN FE,GILDESS FE,LOESTRIN FE) 1.5-30 MG-MCG tablet Take 1 tablet by mouth daily.      . Omega-3 Fatty Acids (FISH OIL) 1000 MG CAPS Take by mouth. Take one by mouth daily      . St Johns Wort 300 MG CAPS Take 2 capsules by mouth daily. And one at night       . albuterol (VENTOLIN HFA) 108 (90 BASE) MCG/ACT inhaler Inhale 2 puffs into the lungs every 6 (six) hours as needed.        . diazepam (VALIUM) 5 MG tablet 1-2 tablets as needed for spasms, may repeat 1-2 tablets in 3-4 hours if spasms  persists-not to exceed 4 tablets in 24 hours  30 tablet  1   No current facility-administered medications for this visit.    Allergies  Allergen Reactions  . Bee Venom     REACTION: Hives and Swelling  . Bupropion Hcl     REACTION: Mood Changes  . Cephalexin     REACTION: Hives and Swelling    Family History  Problem Relation Age of Onset  . Heart attack Father 2    History   Social History  . Marital Status: Single    Spouse Name: N/A    Number of Children: N/A  . Years of Education: N/A   Occupational History  . Not on file.   Social History Main Topics  . Smoking status: Never Smoker   . Smokeless tobacco: Never Used  . Alcohol Use: No  . Drug Use: No  . Sexual Activity: Not on file   Other Topics Concern  . Not on file   Social History Narrative  . No narrative on file    ROS:  Constitutional:Endorses fever, fatigue, or headache Denies malaise or abrupt weight changes.  HEENT:Endorses nasal congestion.  Denies eye pain, eye redness, ear pain, ringing in the ears, wax buildup,  runny nose, bloody nose, or sore throat. Respiratory:Endorses shortness of breath, cough, and difficulty breathing. Denies sputum production.   Cardiovascular:Endorses chest tightness Denies chest pain, palpitations or swelling in the hands or feet.    No other specific complaints in a complete review of systems (except as listed in HPI above).  PE:  BP 122/76  Pulse 96  Temp(Src) 98.7 F (37.1 C) (Oral)  Ht 5\' 8"  (1.727 m)  Wt 333 lb 12 oz (151.388 kg)  BMI 50.76 kg/m2  LMP 04/14/2013 Wt Readings from Last 3 Encounters:  04/19/13 333 lb 12 oz (151.388 kg)  02/13/12 335 lb (151.955 kg)  09/12/11 340 lb (154.223 kg)    General: Appears their stated age, well developed, well nourished in NAD. HEENT: Head: normal shape and size; Eyes: sclera white, no icterus, conjunctiva pink, PERRLA and EOMs intact; Ears: Tm's gray and intact, normal light reflex; Nose: mucosa pink and  moist, septum midline, bilateral maxillary tenderness, no frontal tendernes; Throat/Mouth: Teeth present, mucosa pink and moist, no lesions or ulcerations noted.  Neck: Normal range of motion. Neck supple, trachea midline. No massses, lumps or thyromegaly present.  Cardiovascular: Normal rate and rhythm. S1,S2 noted.  No murmur, rubs or gallops noted. No JVD or BLE edema. No carotid bruits noted. Pulmonary/Chest: Labored effort and positive vesicular breath sounds. No respiratory distress. Bilateral upper lobe wheezes, dimished lower lobes. No rales or ronchi noted. Good tactile fremitus, no egophony.  Psychiatric: Mood and affect normal. Behavior is normal. Judgment and thought content normal.     Assessment and Plan: Acute Bronchitis vs Chronic bronchitis exacerbation? Prescribed prednisone taper dose pack for 6 days Albuterol inhaler; 2 puffs every 4-6 hours as needed for shortness of breath or wheezing Called in Z pack prescription; instructed patient only to take if not feeling better by Wednesday; pt stated understanding Tessalon Pearles 200mg ; take one tablet by mouth three times as day as needed for coughing Follow up in 3-5 days if symptoms worsen or do not improve  Terrelle Ruffolo S, Student-NP

## 2013-06-08 ENCOUNTER — Ambulatory Visit: Payer: BC Managed Care – PPO | Admitting: Family Medicine

## 2013-06-08 ENCOUNTER — Encounter: Payer: Self-pay | Admitting: Internal Medicine

## 2013-06-08 ENCOUNTER — Ambulatory Visit (INDEPENDENT_AMBULATORY_CARE_PROVIDER_SITE_OTHER): Payer: BC Managed Care – PPO | Admitting: Internal Medicine

## 2013-06-08 VITALS — BP 130/80 | HR 90 | Temp 97.8°F | Wt 336.0 lb

## 2013-06-08 DIAGNOSIS — J019 Acute sinusitis, unspecified: Secondary | ICD-10-CM

## 2013-06-08 MED ORDER — AMOXICILLIN-POT CLAVULANATE 875-125 MG PO TABS: 1.0000 | ORAL_TABLET | Freq: Two times a day (BID) | ORAL | Status: DC

## 2013-06-08 MED ORDER — HYDROCODONE-HOMATROPINE 5-1.5 MG/5ML PO SYRP: 5.0000 mL | ORAL_SOLUTION | Freq: Every evening | ORAL | Status: DC | PRN

## 2013-06-08 NOTE — Progress Notes (Signed)
Pre-visit discussion using our clinic review tool. No additional management support is needed unless otherwise documented below in the visit note.  

## 2013-06-08 NOTE — Patient Instructions (Signed)

## 2013-06-08 NOTE — Progress Notes (Signed)
Subjective:    Patient ID: Danielle Irwin, female    DOB: February 07, 1971, 42 y.o.   MRN: [id number]  HPI Did get over bronchitis from last month  Has been coughing---dry hacking 2 days ago Then yesterday noted fever, aches, chills Yellow sputum Fever up to 102.8 yesterday---broke ~2AM when she awoke in a sweat Has facial pain Some post nasal drip--has sleep propeed up  Not really SOB---but has DOE with extended walking Some sore throat---thought it was from the cough Some ear pain Frontal and maxillary pressure-- also occiput and left side of head  Tried tylenol for fever--not much help. Now alternating with ibuprofen Delsym and alka seltzer plus not much help  Current Outpatient Prescriptions on File Prior to Visit  Medication Sig Dispense Refill  . albuterol (VENTOLIN HFA) 108 (90 BASE) MCG/ACT inhaler Inhale 2 puffs into the lungs every 6 (six) hours as needed.  1 Inhaler  1  . calcium carbonate (TUMS - DOSED IN MG ELEMENTAL CALCIUM) 500 MG chewable tablet Chew 1 tablet by mouth daily.        . cetirizine-pseudoephedrine (ZYRTEC-D ALLERGY & CONGESTION) 5-120 MG per tablet Take 1 tablet by mouth daily.       . Cholecalciferol (VITAMIN D) 2000 UNITS CAPS Take 1 capsule by mouth daily.        . cyclobenzaprine (FLEXERIL) 10 MG tablet Take 1 tablet (10 mg total) by mouth 2 (two) times daily as needed.  50 tablet  2  . diazepam (VALIUM) 5 MG tablet 1-2 tablets as needed for spasms, may repeat 1-2 tablets in 3-4 hours if spasms persists-not to exceed 4 tablets in 24 hours  30 tablet  1  . fluticasone (FLONASE) 50 MCG/ACT nasal spray 2 sprays by Nasal route daily.        . fluticasone (FLOVENT HFA) 110 MCG/ACT inhaler Inhale 1 puff into the lungs 2 (two) times daily.      . Multiple Vitamin (MULTIVITAMIN) tablet Take 1 tablet by mouth daily.        . norethindrone-ethinyl estradiol-iron (MICROGESTIN FE,GILDESS FE,LOESTRIN FE) 1.5-30 MG-MCG tablet Take 1 tablet by mouth daily.      .  Omega-3 Fatty Acids (FISH OIL) 1000 MG CAPS Take by mouth. Take one by mouth daily      . St Johns Wort 300 MG CAPS Take 2 capsules by mouth daily. And one at night        No current facility-administered medications on file prior to visit.    Allergies  Allergen Reactions  . Bee Venom     REACTION: Hives and Swelling  . Bupropion Hcl     REACTION: Mood Changes  . Cephalexin     REACTION: Hives and Swelling    Past Medical History  Diagnosis Date  . Bronchitis, chronic     ? asthma  . OA (osteoarthritis)   . PCOS (polycystic ovarian syndrome)   . Depression   . Obesity   . Vitamin D deficiency     No past surgical history on file.  Family History  Problem Relation Age of Onset  . Heart attack Father 25    History   Social History  . Marital Status: Single    Spouse Name: N/A    Number of Children: N/A  . Years of Education: N/A   Occupational History  . Not on file.   Social History Main Topics  . Smoking status: Never Smoker   . Smokeless tobacco: Never Used  .  Alcohol Use: No  . Drug Use: No  . Sexual Activity: Not on file   Other Topics Concern  . Not on file   Social History Narrative  . No narrative on file   Review of Systems No rash  No vomiting or diarrhea Appetite off     Objective:   Physical Exam  Constitutional: She appears well-developed and well-nourished. No distress.  Frequent coarse cough  HENT:  Mouth/Throat: Oropharynx is clear and moist. No oropharyngeal exudate.  Maxillary and frontal tenderness Marked nasal inflammation TMs normal  Neck: Normal range of motion. Neck supple.  Pulmonary/Chest: Effort normal and breath sounds normal. No respiratory distress. She has no wheezes. She has no rales.  Lymphadenopathy:    She has no cervical adenopathy.          Assessment & Plan:

## 2013-06-08 NOTE — Assessment & Plan Note (Signed)
May be bacterial despite the time course Will treat with augmentin Discussed supportive care

## 2014-02-01 ENCOUNTER — Ambulatory Visit: Payer: BC Managed Care – PPO | Admitting: Family Medicine

## 2014-03-21 ENCOUNTER — Encounter: Payer: Self-pay | Admitting: Specialist

## 2014-04-10 ENCOUNTER — Encounter: Payer: Self-pay | Admitting: Specialist

## 2014-04-25 ENCOUNTER — Encounter: Payer: Self-pay | Admitting: Family Medicine

## 2014-04-25 ENCOUNTER — Ambulatory Visit (INDEPENDENT_AMBULATORY_CARE_PROVIDER_SITE_OTHER): Payer: BC Managed Care – PPO | Admitting: Family Medicine

## 2014-04-25 VITALS — BP 124/86 | HR 84 | Temp 98.0°F | Wt 349.0 lb

## 2014-04-25 DIAGNOSIS — Z01818 Encounter for other preprocedural examination: Secondary | ICD-10-CM

## 2014-04-25 NOTE — Progress Notes (Signed)
Subjective:    Danielle Irwin is a 43 y.o. female who presents to the office today for a preoperative consultation at the request of surgeon Dr. Tonita Cong who plans on performing :i,bar decompression on TBD  This consultation is requested for the specific conditions prompting preoperative evaluation (i.e. because of potential affect on operative risk):unknown. Planned anesthesia: general. The patient has the following known anesthesia issues: obesity. Patients bleeding risk: no recent abnormal bleeding.  Has never had surgery so unknown how she would tolerate intubation and or anesthesia.  Current Outpatient Prescriptions on File Prior to Visit  Medication Sig Dispense Refill  . albuterol (VENTOLIN HFA) 108 (90 BASE) MCG/ACT inhaler Inhale 2 puffs into the lungs every 6 (six) hours as needed. 1 Inhaler 1  . calcium carbonate (TUMS - DOSED IN MG ELEMENTAL CALCIUM) 500 MG chewable tablet Chew 1 tablet by mouth daily.      . cetirizine-pseudoephedrine (ZYRTEC-D ALLERGY & CONGESTION) 5-120 MG per tablet Take 1 tablet by mouth daily.     . Cholecalciferol (VITAMIN D) 2000 UNITS CAPS Take 1 capsule by mouth daily.      . cyclobenzaprine (FLEXERIL) 10 MG tablet Take 1 tablet (10 mg total) by mouth 2 (two) times daily as needed. 50 tablet 2  . Multiple Vitamin (MULTIVITAMIN) tablet Take 1 tablet by mouth daily.      . norethindrone-ethinyl estradiol-iron (MICROGESTIN FE,GILDESS FE,LOESTRIN FE) 1.5-30 MG-MCG tablet Take 1 tablet by mouth daily.    Francella Solian Johns Wort 300 MG CAPS Take 2 capsules by mouth daily. And one at night      No current facility-administered medications on file prior to visit.    Allergies  Allergen Reactions  . Bee Venom     REACTION: Hives and Swelling  . Bupropion Hcl     REACTION: Mood Changes  . Cephalexin     REACTION: Hives and Swelling    Past Medical History  Diagnosis Date  . Bronchitis, chronic     ? asthma  . OA (osteoarthritis)   . PCOS (polycystic ovarian  syndrome)   . Depression   . Obesity   . Vitamin D deficiency     No past surgical history on file.  Family History  Problem Relation Age of Onset  . Heart attack Father 20    History   Social History  . Marital Status: Single    Spouse Name: N/A    Number of Children: N/A  . Years of Education: N/A   Occupational History  . Not on file.   Social History Main Topics  . Smoking status: Never Smoker   . Smokeless tobacco: Never Used  . Alcohol Use: No  . Drug Use: No  . Sexual Activity: Not on file   Other Topics Concern  . Not on file   Social History Narrative   The PMH, PSH, Social History, Family History, Medications, and allergies have been reviewed in [facility], and have been updated if relevant.   Review of Systems Pertinent items are noted in HPI.    Objective:    BP 124/86 mmHg  Pulse 84  Temp(Src) 98 F (36.7 C) (Oral)  Wt 349 lb (158.305 kg)  SpO2 94%  LMP   General Appearance:    Obese, Alert, cooperative, no distress, appears stated age  Head:    Normocephalic, without obvious abnormality, atraumatic  Eyes:    PERRL, conjunctiva/corneas clear, EOM's intact, fundi    benign, both eyes  Ears:    Normal  TM's and external ear canals, both ears  Nose:   Nares normal, septum midline, mucosa normal, no drainage    or sinus tenderness  Throat:   Lips, mucosa, and tongue normal; teeth and gums normal  Neck:   Supple, symmetrical, trachea midline, no adenopathy;    thyroid:  no enlargement/tenderness/nodules; no carotid   bruit or JVD  Back:     Symmetric, no curvature, ROM normal, no CVA tenderness  Lungs:     Clear to auscultation bilaterally, respirations unlabored  Chest Wall:    No tenderness or deformity   Heart:    Regular rate and rhythm, S1 and S2 normal, no murmur, rub   or gallop  Abdomen:     Soft, non-tender, bowel sounds active all four quadrants,    no masses, no organomegaly  Extremities:   Extremities normal, atraumatic, no cyanosis or  edema  Pulses:   2+ and symmetric all extremities  Skin:   Skin color, texture, turgor normal, no rashes or lesions  Neurologic:   CNII-XII intact, normal strength, sensation and reflexes    throughout    Cardiographics ECG: normal sinus rhythm, no blocks or conduction defects, no ischemic changes        Assessment:      43 y.o. female with planned surgery as above.   Known risk factors for perioperative complications: Morbid obesity   Difficulty with intubation is not anticipated.  Cardiac Risk Estimation: from my standpoint- low, normal EKG, normal vital signs, no h/o bleeding     Plan:    1. Preoperative workup as follows ECG- forward results to Dr. Tonita Cong.

## 2014-04-25 NOTE — Progress Notes (Signed)
Pre visit review using our clinic review tool, if applicable. No additional management support is needed unless otherwise documented below in the visit note. 

## 2014-04-29 ENCOUNTER — Ambulatory Visit: Payer: Self-pay | Admitting: Orthopedic Surgery

## 2014-05-10 ENCOUNTER — Encounter: Payer: Self-pay | Admitting: Specialist

## 2014-05-17 ENCOUNTER — Ambulatory Visit: Payer: Self-pay | Admitting: Orthopedic Surgery

## 2014-05-17 NOTE — H&P (Signed)
Danielle Irwin is an 43 y.o. female.   Chief Complaint: back and right leg pain HPI: The patient is a 43 year old female who presents today for follow up of their back. The patient is being followed for their low back symptoms. They are now 8 week(s) out from a flare up. Symptoms reported today include: pain and leg pain (right). The patient states that they are doing poorly. Current treatment includes: relative rest, activity modification, pain medications and use of a cane. The following medication has been used for pain control: Oxycodone (needs refill). The patient presents today following ESI right L4-5 x 2 weeks. The patient reports the injection did not help.  Danielle Irwin follows up. She's having severe right lower extremity radicular pain into the top of her foot. She is here with her mother. She had an epidural. This one didn't seem to help like it has in the past. She's had a recent MRI which showed a further disc protrusion at 4-5 to the right. She has disc degeneration at 3-4, 4-5 and 5-1. She has a disc protrusion of 5-1 to the left but has no left-sided symptoms.  Past Medical History  Diagnosis Date  . Bronchitis, chronic     ? asthma  . OA (osteoarthritis)   . PCOS (polycystic ovarian syndrome)   . Depression   . Obesity   . Vitamin D deficiency     No past surgical history on file.  Family History  Problem Relation Age of Onset  . Heart attack Father 48   Social History:  reports that she has never smoked. She has never used smokeless tobacco. She reports that she does not drink alcohol or use illicit drugs.  Allergies:  Allergies  Allergen Reactions  . Bee Venom     REACTION: Hives and Swelling  . Bupropion Hcl     REACTION: Mood Changes  . Cephalexin     REACTION: Hives and Swelling     (Not in a hospital admission)  No results found for this or any previous visit (from the past 48 hour(s)). No results found.  Review of Systems  Constitutional:  Negative.   HENT: Negative.   Eyes: Negative.   Respiratory: Negative.   Cardiovascular: Negative.   Gastrointestinal: Negative.   Genitourinary: Negative.   Musculoskeletal: Positive for back pain.  Skin: Negative.   Neurological: Positive for focal weakness.    There were no vitals taken for this visit. Physical Exam  Constitutional: She is oriented to person, place, and time.  obese  HENT:  Head: Normocephalic and atraumatic.  Eyes: Conjunctivae and EOM are normal. Pupils are equal, round, and reactive to light.  Neck: Normal range of motion. Neck supple.  Cardiovascular: Normal rate and regular rhythm.   Respiratory: Effort normal and breath sounds normal.  GI: Soft. Bowel sounds are normal.  Musculoskeletal:  On exam, in moderate distress. SLR produces buttock, thigh and calf pain on the left, negative on the right, trace EHL weakness on the right compared to the left. Some discomfort in the LS junction.  Lumbar spine exam reveals no evidence of soft tissue swelling, ecchymosis or deformity. The abdomen is soft and nontender. Nontender over the trochanters. No cellulitis or lymphadenopathy.  Good range of motion of the lumbar spine without associated pain. Motor is 5/5 including tibialis anterior, plantar flexion, quadriceps and hamstrings. Patient is normoreflexic. There is no Babinski or clonus. Sensory exam is intact to light touch. The patient has good distal pulses.   No DVT. No pain and normal range of motion without instability of the hips, knees and ankles.   Neurological: She is alert and oriented to person, place, and time. She has normal reflexes.  Skin: Skin is warm and dry.  Psychiatric: She has a normal mood and affect.    MRI was reviewed. There is a paracentral disc herniation at 4-5 and affecting the 5 root and a paracentral disc herniation at 5-1 on the left.  Assessment/Plan 1. 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 2. Back pain secondary to multi-level disc degeneration at 3-4, 4-5 and 5-1. 3. Asymptomatic disc herniation at L5-S1 to the left. 4. Morbid obesity.  I had an extensive discussion concerning current pathology, relevant anatomy and treatment options. We discussed weight reduction. She indicates she has changed her diet and is trying to lose weight. She can't do any activity because of the pain into her leg. She cannot work. She's on leave. She needs to get back to work. She is here with her mother, on whom I have operated. She has asked about lumbar decompression. We indicated it's an option. Though it would not cure all her symptoms, I would isolate it to 4-5, given she's had relief of an epidural in the past at 4-5. There is pain in the L5 nerve root distribution. She really doesn't have any pain on the left-hand side. I would live the disc at 5-1 alone. We did discuss the probability of persistent back pain and possible recurrent disc herniation. I am concerned about her weight at 320. She is also allergic to Cephalexin. I indicated she would have residual symptoms and would need persistent restrictions, possibility of a fusion in the future. This has been going on for a significant period of time. She's taking Oxycodone. We will proceed accordingly. We have run out of options at this point in time. She's up for 4 hours then has to lie down with pain medicine.  Risks and benefits of this procedure were discussed with the patient including worsening of symptoms, no changes in symptoms, recurrent disc herniation, scar tissue, epidural fibrosis, damage to neurovascular structures, cerebral spinal fluid leak which would require repair or patching, DVT, PE, anesthetic complications, etc. We discussed the perioperative course, the hospitalization, and the need for postoperative rehabilitation and the time estimate for recovery. We also discussed the possibility of future surgery  including repeat decompression, fusion. The patient was provided an illustrated handout which was discussed in detail. Appropriate anatomic models were used as well.  Plan microlumbar decompression L4-5 right  BISSELL, JACLYN M. PA-C for Dr. Beane 05/17/2014, 12:59 PM    

## 2014-05-25 NOTE — Patient Instructions (Addendum)
Danielle Irwin  05/25/2014   Your procedure is scheduled on: 06/02/14   Report to [place]  Entrance and follow signs to               [place] at 5:30AM .  Call this number if you have problems the morning of surgery [phone]   Remember:  Do not eat food or drink liquids :After Midnight.     Take these medicines the morning of surgery with A SIP OF WATER: PERCOCET IF NEEDED                               You may not have any metal on your body including hair pins and              piercings  Do not wear jewelry, make-up, lotions, powders or perfumes.             Do not wear nail polish.  Do not shave  48 hours prior to surgery.              Men may shave face and neck.   Do not bring valuables to the hospital. [place].  Contacts, dentures or bridgework may not be worn into surgery.  Leave suitcase in the car. After surgery it may be brought to your room.     Patients discharged the day of surgery will not be allowed to drive home.  Name and phone number of your driver:  Special Instructions: N/A              Please read over the following fact sheets you were given: _____________________________________________________________________                                                     [place]  Before surgery, you can play an important role.  Because skin is not sterile, your skin needs to be as free of germs as possible.  You can reduce the number of germs on your skin by washing with CHG (chlorahexidine gluconate) soap before surgery.  CHG is an antiseptic cleaner which kills germs and bonds with the skin to continue killing germs even after washing. Please DO NOT use if you have an allergy to CHG or antibacterial soaps.  If your skin becomes reddened/irritated stop using the CHG and inform your nurse when you arrive at Short Stay. Do not shave  (including legs and underarms) for at least 48 hours prior to the first CHG shower.  You may shave your face. Please follow these instructions carefully:   1.  Shower with CHG Soap the night before surgery and the  morning of Surgery.   2.  If you choose to wash your hair, wash your hair first as usual with your  normal  Shampoo.   3.  After you shampoo, rinse your hair and body thoroughly to remove the  shampoo.  4.  Use CHG as you would any other liquid soap.  You can apply chg directly  to the skin and wash . Gently wash with scrungie or clean wascloth    5.  Apply the CHG Soap to your body ONLY FROM THE NECK DOWN.   Do not use on open                           Wound or open sores. Avoid contact with eyes, ears mouth and genitals (private parts).                        Genitals (private parts) with your normal soap.              6.  Wash thoroughly, paying special attention to the area where your surgery  will be performed.   7.  Thoroughly rinse your body with warm water from the neck down.   8.  DO NOT shower/wash with your normal soap after using and rinsing off  the CHG Soap .                9.  Pat yourself dry with a clean towel.             10.  Wear clean pajamas.             11.  Place clean sheets on your bed the night of your first shower and do not  sleep with pets.  Day of Surgery : Do not apply any lotions/deodorants the morning of surgery.  Please wear clean clothes to the hospital/surgery center.  FAILURE TO FOLLOW THESE INSTRUCTIONS MAY RESULT IN THE CANCELLATION OF YOUR SURGERY    PATIENT SIGNATURE_________________________________  ______________________________________________________________________     [name]  An incentive spirometer is a tool that can help keep your lungs clear and active. This tool measures how well you are filling your lungs with each breath. Taking long deep breaths may help  reverse or decrease the chance of developing breathing (pulmonary) problems (especially infection) following:  A long period of time when you are unable to move or be active. BEFORE THE PROCEDURE   If the spirometer includes an indicator to show your best effort, your nurse or respiratory therapist will set it to a desired goal.  If possible, sit up straight or lean slightly forward. Try not to slouch.  Hold the incentive spirometer in an upright position. INSTRUCTIONS FOR USE  1. Sit on the edge of your bed if possible, or sit up as far as you can in bed or on a chair. 2. Hold the incentive spirometer in an upright position. 3. Breathe out normally. 4. Place the mouthpiece in your mouth and seal your lips tightly around it. 5. Breathe in slowly and as deeply as possible, raising the piston or the ball toward the top of the column. 6. Hold your breath for 3-5 seconds or for as long as possible. Allow the piston or ball to fall to the bottom of the column. 7. Remove the mouthpiece from your mouth and breathe out normally. 8. Rest for a few seconds and repeat Steps 1 through 7 at least 10 times every 1-2 hours when you are awake. Take your time and take a few normal breaths between deep breaths. 9. The spirometer may include an indicator to show your best effort. Use the indicator as a goal to work toward during  each repetition. 10. After each set of 10 deep breaths, practice coughing to be sure your lungs are clear. If you have an incision (the cut made at the time of surgery), support your incision when coughing by placing a pillow or rolled up towels firmly against it. Once you are able to get out of bed, walk around indoors and cough well. You may stop using the incentive spirometer when instructed by your caregiver.  RISKS AND COMPLICATIONS  Take your time so you do not get dizzy or light-headed.  If you are in pain, you may need to take or ask for pain medication before doing incentive  spirometry. It is harder to take a deep breath if you are having pain. AFTER USE  Rest and breathe slowly and easily.  It can be helpful to keep track of a log of your progress. Your caregiver can provide you with a simple table to help with this. If you are using the spirometer at home, follow these instructions: [place] IF:   You are having difficultly using the spirometer.  You have trouble using the spirometer as often as instructed.  Your pain medication is not giving enough relief while using the spirometer.  You develop fever of 100.5 F (38.1 C) or higher. SEEK IMMEDIATE MEDICAL CARE IF:   You cough up bloody sputum that had not been present before.  You develop fever of 102 F (38.9 C) or greater.  You develop worsening pain at or near the incision site. MAKE SURE YOU:   Understand these instructions.  Will watch your condition.  Will get help right away if you are not doing well or get worse. Document Released: 10/07/2006 Document Revised: 08/19/2011 Document Reviewed: 12/08/2006 [facility] Patient Information 2014 [place], [place].   ________________________________________________________________________

## 2014-05-26 ENCOUNTER — Encounter (HOSPITAL_COMMUNITY): Payer: Self-pay

## 2014-05-26 ENCOUNTER — Encounter (HOSPITAL_COMMUNITY)
Admission: RE | Admit: 2014-05-26 | Discharge: 2014-05-26 | Disposition: A | Discharge status: Home / Self Care | Payer: BC Managed Care – PPO | Source: Ambulatory Visit | Attending: Specialist | Admitting: Specialist

## 2014-05-26 DIAGNOSIS — Z01812 Encounter for preprocedural laboratory examination: Secondary | ICD-10-CM | POA: Insufficient documentation

## 2014-05-26 HISTORY — DX: Insomnia, unspecified: G47.00

## 2014-05-26 HISTORY — DX: Headache, unspecified: R51.9

## 2014-05-26 HISTORY — DX: Headache: R51

## 2014-05-26 HISTORY — DX: Unspecified asthma, uncomplicated: J45.909

## 2014-05-26 HISTORY — DX: Family history of other specified conditions: Z84.89

## 2014-05-26 HISTORY — DX: Hyperesthesia: R20.3

## 2014-05-26 HISTORY — DX: Personal history of other specified conditions: Z87.898

## 2014-05-26 HISTORY — DX: Other intervertebral disc displacement, lumbar region: M51.26

## 2014-05-26 LAB — SURGICAL PCR SCREEN
MRSA, PCR: NEGATIVE
Staphylococcus aureus: NEGATIVE

## 2014-05-26 LAB — BASIC METABOLIC PANEL
ANION GAP: 13 (ref 5–15)
BUN: 10 mg/dL (ref 6–23)
CO2: 26 mEq/L (ref 19–32)
CREATININE: 0.88 mg/dL (ref 0.50–1.10)
Calcium: 9.4 mg/dL (ref 8.4–10.5)
Chloride: 101 mEq/L (ref 96–112)
GFR calc non Af Amer: 79 mL/min — ABNORMAL LOW (ref >90)
Glucose, Bld: 125 mg/dL — ABNORMAL HIGH (ref 70–99)
POTASSIUM: 4 meq/L (ref 3.7–5.3)
Sodium: 140 mEq/L (ref 137–147)

## 2014-05-26 LAB — CBC
HCT: 38.8 % (ref 36.0–46.0)
HEMOGLOBIN: 13.1 g/dL (ref 12.0–15.0)
MCH: 30.6 pg (ref 26.0–34.0)
MCHC: 33.8 g/dL (ref 30.0–36.0)
MCV: 90.7 fL (ref 78.0–100.0)
PLATELETS: 241 10*3/uL (ref 150–400)
RBC: 4.28 MIL/uL (ref 3.87–5.11)
RDW: 12.5 % (ref 11.5–15.5)
WBC: 8 10*3/uL (ref 4.0–10.5)

## 2014-05-26 LAB — HCG, SERUM, QUALITATIVE: Preg, Serum: NEGATIVE

## 2014-06-02 ENCOUNTER — Ambulatory Visit (HOSPITAL_COMMUNITY): Payer: BC Managed Care – PPO | Admitting: Anesthesiology

## 2014-06-02 ENCOUNTER — Ambulatory Visit (HOSPITAL_COMMUNITY)
Admission: RE | Admit: 2014-06-02 | Discharge: 2014-06-03 | Disposition: A | Discharge status: Home / Self Care | Payer: BC Managed Care – PPO | Source: Ambulatory Visit | Attending: Specialist | Admitting: Specialist

## 2014-06-02 ENCOUNTER — Encounter (HOSPITAL_COMMUNITY)
Admission: RE | Disposition: A | Discharge status: Home / Self Care | Payer: Self-pay | Source: Ambulatory Visit | Attending: Specialist

## 2014-06-02 ENCOUNTER — Encounter (HOSPITAL_COMMUNITY): Payer: Self-pay | Admitting: *Deleted

## 2014-06-02 ENCOUNTER — Ambulatory Visit (HOSPITAL_COMMUNITY): Payer: BC Managed Care – PPO

## 2014-06-02 DIAGNOSIS — M4806 Spinal stenosis, lumbar region: Secondary | ICD-10-CM | POA: Insufficient documentation

## 2014-06-02 DIAGNOSIS — E559 Vitamin D deficiency, unspecified: Secondary | ICD-10-CM | POA: Diagnosis not present

## 2014-06-02 DIAGNOSIS — J45909 Unspecified asthma, uncomplicated: Secondary | ICD-10-CM | POA: Insufficient documentation

## 2014-06-02 DIAGNOSIS — Z91030 Bee allergy status: Secondary | ICD-10-CM | POA: Insufficient documentation

## 2014-06-02 DIAGNOSIS — M5126 Other intervertebral disc displacement, lumbar region: Secondary | ICD-10-CM | POA: Diagnosis not present

## 2014-06-02 DIAGNOSIS — Z87891 Personal history of nicotine dependence: Secondary | ICD-10-CM | POA: Insufficient documentation

## 2014-06-02 DIAGNOSIS — M48061 Spinal stenosis, lumbar region without neurogenic claudication: Secondary | ICD-10-CM | POA: Diagnosis present

## 2014-06-02 DIAGNOSIS — Z6841 Body Mass Index (BMI) 40.0 and over, adult: Secondary | ICD-10-CM | POA: Diagnosis not present

## 2014-06-02 DIAGNOSIS — Z888 Allergy status to other drugs, medicaments and biological substances status: Secondary | ICD-10-CM | POA: Insufficient documentation

## 2014-06-02 DIAGNOSIS — Z419 Encounter for procedure for purposes other than remedying health state, unspecified: Secondary | ICD-10-CM

## 2014-06-02 DIAGNOSIS — F329 Major depressive disorder, single episode, unspecified: Secondary | ICD-10-CM | POA: Diagnosis not present

## 2014-06-02 DIAGNOSIS — M1990 Unspecified osteoarthritis, unspecified site: Secondary | ICD-10-CM | POA: Insufficient documentation

## 2014-06-02 DIAGNOSIS — Z881 Allergy status to other antibiotic agents status: Secondary | ICD-10-CM | POA: Diagnosis not present

## 2014-06-02 DIAGNOSIS — E282 Polycystic ovarian syndrome: Secondary | ICD-10-CM | POA: Insufficient documentation

## 2014-06-02 HISTORY — PX: LUMBAR LAMINECTOMY/DECOMPRESSION MICRODISCECTOMY: SHX5026

## 2014-06-02 SURGERY — LUMBAR LAMINECTOMY/DECOMPRESSION MICRODISCECTOMY 1 LEVEL
Anesthesia: General | Site: Back | Laterality: Right

## 2014-06-02 MED ORDER — DIPHENHYDRAMINE HCL 50 MG/ML IJ SOLN
12.5000 mg | Freq: Once | INTRAMUSCULAR | Status: AC
  Administered 2014-06-02: 12.5 mg via INTRAVENOUS

## 2014-06-02 MED ORDER — FENTANYL CITRATE 0.05 MG/ML IJ SOLN
INTRAMUSCULAR | Status: AC
  Filled 2014-06-02: qty 5

## 2014-06-02 MED ORDER — BUPIVACAINE-EPINEPHRINE (PF) 0.5% -1:200000 IJ SOLN
INTRAMUSCULAR | Status: AC
  Filled 2014-06-02: qty 30

## 2014-06-02 MED ORDER — DIPHENHYDRAMINE HCL 50 MG/ML IJ SOLN: 12.5000 mg | Freq: Four times a day (QID) | INTRAMUSCULAR | Status: DC | PRN

## 2014-06-02 MED ORDER — LIDOCAINE HCL (CARDIAC) 20 MG/ML IV SOLN
INTRAVENOUS | Status: DC | PRN
  Administered 2014-06-02: 100 mg via INTRAVENOUS

## 2014-06-02 MED ORDER — VANCOMYCIN HCL 10 G IV SOLR
1500.0000 mg | Freq: Two times a day (BID) | INTRAVENOUS | Status: AC
  Administered 2014-06-02: 1500 mg via INTRAVENOUS
  Filled 2014-06-02: qty 1500

## 2014-06-02 MED ORDER — SODIUM CHLORIDE 0.9 % IJ SOLN
3.0000 mL | INTRAMUSCULAR | Status: DC | PRN
Start: 2014-06-02 — End: 2014-06-03

## 2014-06-02 MED ORDER — DIPHENHYDRAMINE HCL 25 MG PO CAPS
25.0000 mg | ORAL_CAPSULE | Freq: Four times a day (QID) | ORAL | Status: DC | PRN
  Administered 2014-06-02: 25 mg via ORAL
  Filled 2014-06-02: qty 1

## 2014-06-02 MED ORDER — DEXAMETHASONE SODIUM PHOSPHATE 10 MG/ML IJ SOLN
INTRAMUSCULAR | Status: DC | PRN
Start: 2014-06-02 — End: 2014-06-02
  Administered 2014-06-02: 10 mg via INTRAVENOUS

## 2014-06-02 MED ORDER — LACTATED RINGERS IV SOLN
INTRAVENOUS | Status: DC
  Administered 2014-06-02: 07:00:00 via INTRAVENOUS
  Administered 2014-06-02: 1000 mL via INTRAVENOUS
  Administered 2014-06-02: 08:00:00 via INTRAVENOUS

## 2014-06-02 MED ORDER — METHOCARBAMOL 1000 MG/10ML IJ SOLN
500.0000 mg | Freq: Four times a day (QID) | INTRAVENOUS | Status: DC | PRN
  Filled 2014-06-02: qty 5

## 2014-06-02 MED ORDER — ALUM & MAG HYDROXIDE-SIMETH 200-200-20 MG/5ML PO SUSP: 30.0000 mL | Freq: Four times a day (QID) | ORAL | Status: DC | PRN

## 2014-06-02 MED ORDER — BISACODYL 5 MG PO TBEC: 5.0000 mg | DELAYED_RELEASE_TABLET | Freq: Every day | ORAL | Status: DC | PRN

## 2014-06-02 MED ORDER — ACETAMINOPHEN 650 MG RE SUPP: 650.0000 mg | RECTAL | Status: DC | PRN

## 2014-06-02 MED ORDER — PROPOFOL 10 MG/ML IV BOLUS
INTRAVENOUS | Status: AC
  Filled 2014-06-02: qty 20

## 2014-06-02 MED ORDER — SENNOSIDES-DOCUSATE SODIUM 8.6-50 MG PO TABS: 1.0000 | ORAL_TABLET | Freq: Every evening | ORAL | Status: DC | PRN

## 2014-06-02 MED ORDER — NEOSTIGMINE METHYLSULFATE 10 MG/10ML IV SOLN
INTRAVENOUS | Status: DC | PRN
  Administered 2014-06-02: 5 mg via INTRAVENOUS

## 2014-06-02 MED ORDER — OXYCODONE-ACETAMINOPHEN 7.5-325 MG PO TABS: 1.0000 | ORAL_TABLET | ORAL | Status: DC | PRN

## 2014-06-02 MED ORDER — DOCUSATE SODIUM 100 MG PO CAPS
100.0000 mg | ORAL_CAPSULE | Freq: Two times a day (BID) | ORAL | Status: DC
  Administered 2014-06-02 – 2014-06-03 (×2): 100 mg via ORAL

## 2014-06-02 MED ORDER — FENTANYL CITRATE 0.05 MG/ML IJ SOLN
INTRAMUSCULAR | Status: DC | PRN
  Administered 2014-06-02: 50 ug via INTRAVENOUS
  Administered 2014-06-02: 100 ug via INTRAVENOUS
  Administered 2014-06-02 (×2): 50 ug via INTRAVENOUS

## 2014-06-02 MED ORDER — ONDANSETRON HCL 4 MG/2ML IJ SOLN
INTRAMUSCULAR | Status: DC | PRN
  Administered 2014-06-02: 4 mg via INTRAVENOUS

## 2014-06-02 MED ORDER — DOCUSATE SODIUM 100 MG PO CAPS: 100.0000 mg | ORAL_CAPSULE | Freq: Two times a day (BID) | ORAL | Status: DC | PRN

## 2014-06-02 MED ORDER — MELATONIN 10 MG PO TABS: 1.0000 | ORAL_TABLET | Freq: Every evening | ORAL | Status: DC | PRN

## 2014-06-02 MED ORDER — NORETHIN ACE-ETH ESTRAD-FE 1.5-30 MG-MCG PO TABS: 1.0000 | ORAL_TABLET | Freq: Every day | ORAL | Status: DC

## 2014-06-02 MED ORDER — DIPHENHYDRAMINE HCL 50 MG/ML IJ SOLN
INTRAMUSCULAR | Status: AC
  Filled 2014-06-02: qty 1

## 2014-06-02 MED ORDER — HYDROMORPHONE HCL 1 MG/ML IJ SOLN
INTRAMUSCULAR | Status: AC
  Filled 2014-06-02: qty 1

## 2014-06-02 MED ORDER — HYDROCODONE-ACETAMINOPHEN 5-325 MG PO TABS
1.0000 | ORAL_TABLET | ORAL | Status: DC | PRN
  Administered 2014-06-02 (×2): 2 via ORAL
  Administered 2014-06-02: 1 via ORAL
  Administered 2014-06-03 (×2): 2 via ORAL
  Filled 2014-06-02 (×2): qty 2
  Filled 2014-06-02: qty 1
  Filled 2014-06-02 (×2): qty 2

## 2014-06-02 MED ORDER — ACETAMINOPHEN 325 MG PO TABS: 650.0000 mg | ORAL_TABLET | ORAL | Status: DC | PRN

## 2014-06-02 MED ORDER — MEPERIDINE HCL 50 MG/ML IJ SOLN
6.2500 mg | INTRAMUSCULAR | Status: DC | PRN
  Administered 2014-06-02: 12.5 mg via INTRAVENOUS

## 2014-06-02 MED ORDER — GLYCOPYRROLATE 0.2 MG/ML IJ SOLN
INTRAMUSCULAR | Status: DC | PRN
  Administered 2014-06-02: 0.6 mg via INTRAVENOUS

## 2014-06-02 MED ORDER — PROPOFOL 10 MG/ML IV BOLUS
INTRAVENOUS | Status: DC | PRN
  Administered 2014-06-02: 250 mg via INTRAVENOUS

## 2014-06-02 MED ORDER — SODIUM CHLORIDE 0.9 % IR SOLN
Status: AC
  Filled 2014-06-02: qty 1

## 2014-06-02 MED ORDER — FLEET ENEMA 7-19 GM/118ML RE ENEM: 1.0000 | ENEMA | Freq: Once | RECTAL | Status: AC | PRN

## 2014-06-02 MED ORDER — HYDROMORPHONE HCL 1 MG/ML IJ SOLN: 0.5000 mg | INTRAMUSCULAR | Status: DC | PRN

## 2014-06-02 MED ORDER — PHENOL 1.4 % MT LIQD
1.0000 | OROMUCOSAL | Status: DC | PRN
  Filled 2014-06-02: qty 177

## 2014-06-02 MED ORDER — SUCCINYLCHOLINE CHLORIDE 20 MG/ML IJ SOLN
INTRAMUSCULAR | Status: DC | PRN
  Administered 2014-06-02: 160 mg via INTRAVENOUS

## 2014-06-02 MED ORDER — DOXYLAMINE SUCCINATE (SLEEP) 25 MG PO TABS
25.0000 mg | ORAL_TABLET | Freq: Every evening | ORAL | Status: DC | PRN
  Filled 2014-06-02: qty 1

## 2014-06-02 MED ORDER — MIDAZOLAM HCL 2 MG/2ML IJ SOLN
INTRAMUSCULAR | Status: AC
  Filled 2014-06-02: qty 2

## 2014-06-02 MED ORDER — SODIUM CHLORIDE 0.9 % IR SOLN
Status: DC | PRN
  Administered 2014-06-02: 500 mL

## 2014-06-02 MED ORDER — MEPERIDINE HCL 50 MG/ML IJ SOLN
INTRAMUSCULAR | Status: AC
  Filled 2014-06-02: qty 1

## 2014-06-02 MED ORDER — BUPIVACAINE-EPINEPHRINE 0.5% -1:200000 IJ SOLN
INTRAMUSCULAR | Status: DC | PRN
  Administered 2014-06-02: 14 mL

## 2014-06-02 MED ORDER — VITAMIN C 500 MG PO TABS
500.0000 mg | ORAL_TABLET | Freq: Two times a day (BID) | ORAL | Status: DC
  Administered 2014-06-02 – 2014-06-03 (×2): 500 mg via ORAL
  Filled 2014-06-02 (×3): qty 1

## 2014-06-02 MED ORDER — VANCOMYCIN HCL 10 G IV SOLR
1500.0000 mg | INTRAVENOUS | Status: AC
  Administered 2014-06-02: 1500 mg via INTRAVENOUS
  Filled 2014-06-02: qty 1500

## 2014-06-02 MED ORDER — ONDANSETRON HCL 4 MG/2ML IJ SOLN: 4.0000 mg | INTRAMUSCULAR | Status: DC | PRN

## 2014-06-02 MED ORDER — METHOCARBAMOL 500 MG PO TABS: 500.0000 mg | ORAL_TABLET | Freq: Three times a day (TID) | ORAL | Status: DC | PRN

## 2014-06-02 MED ORDER — LIDOCAINE HCL (CARDIAC) 20 MG/ML IV SOLN
INTRAVENOUS | Status: AC
  Filled 2014-06-02: qty 5

## 2014-06-02 MED ORDER — MIDAZOLAM HCL 5 MG/5ML IJ SOLN
INTRAMUSCULAR | Status: DC | PRN
  Administered 2014-06-02: 2 mg via INTRAVENOUS

## 2014-06-02 MED ORDER — KCL IN DEXTROSE-NACL 20-5-0.45 MEQ/L-%-% IV SOLN
INTRAVENOUS | Status: DC
  Administered 2014-06-02: 13:00:00 via INTRAVENOUS
  Filled 2014-06-02 (×3): qty 1000

## 2014-06-02 MED ORDER — ST JOHNS WORT 300 MG PO CAPS: 3.0000 | ORAL_CAPSULE | Freq: Two times a day (BID) | ORAL | Status: DC

## 2014-06-02 MED ORDER — MENTHOL 3 MG MT LOZG
1.0000 | LOZENGE | OROMUCOSAL | Status: DC | PRN
  Filled 2014-06-02: qty 9

## 2014-06-02 MED ORDER — ROCURONIUM BROMIDE 100 MG/10ML IV SOLN
INTRAVENOUS | Status: DC | PRN
  Administered 2014-06-02: 10 mg via INTRAVENOUS
  Administered 2014-06-02: 40 mg via INTRAVENOUS

## 2014-06-02 MED ORDER — ROCURONIUM BROMIDE 100 MG/10ML IV SOLN
INTRAVENOUS | Status: AC
  Filled 2014-06-02: qty 1

## 2014-06-02 MED ORDER — METHOCARBAMOL 500 MG PO TABS
500.0000 mg | ORAL_TABLET | Freq: Four times a day (QID) | ORAL | Status: DC | PRN
Start: 2014-06-02 — End: 2014-06-03
  Administered 2014-06-02 – 2014-06-03 (×2): 500 mg via ORAL
  Filled 2014-06-02 (×2): qty 1

## 2014-06-02 MED ORDER — SODIUM CHLORIDE 0.9 % IJ SOLN: 3.0000 mL | Freq: Two times a day (BID) | INTRAMUSCULAR | Status: DC

## 2014-06-02 MED ORDER — THROMBIN 5000 UNITS EX SOLR
CUTANEOUS | Status: AC
  Filled 2014-06-02: qty 10000

## 2014-06-02 MED ORDER — OXYCODONE-ACETAMINOPHEN 5-325 MG PO TABS: 1.0000 | ORAL_TABLET | ORAL | Status: DC | PRN

## 2014-06-02 MED ORDER — ALBUTEROL SULFATE (2.5 MG/3ML) 0.083% IN NEBU: 2.5000 mg | INHALATION_SOLUTION | Freq: Four times a day (QID) | RESPIRATORY_TRACT | Status: DC | PRN

## 2014-06-02 MED ORDER — PROMETHAZINE HCL 25 MG/ML IJ SOLN: 6.2500 mg | INTRAMUSCULAR | Status: DC | PRN

## 2014-06-02 MED ORDER — SODIUM CHLORIDE 0.9 % IV SOLN: 250.0000 mL | INTRAVENOUS | Status: DC

## 2014-06-02 MED ORDER — THROMBIN 5000 UNITS EX SOLR
OROMUCOSAL | Status: DC | PRN
  Administered 2014-06-02: 09:00:00 via TOPICAL

## 2014-06-02 MED ORDER — HYDROMORPHONE HCL 1 MG/ML IJ SOLN
0.2500 mg | INTRAMUSCULAR | Status: DC | PRN
  Administered 2014-06-02 (×4): 0.5 mg via INTRAVENOUS

## 2014-06-02 SURGICAL SUPPLY — 45 items
BAG SPEC THK2 15X12 ZIP CLS (MISCELLANEOUS)
BAG ZIPLOCK 12X15 (MISCELLANEOUS) IMPLANT
CLEANER TIP ELECTROSURG 2X2 (MISCELLANEOUS) ×2 IMPLANT
CLOTH 2% CHLOROHEXIDINE 3PK (PERSONAL CARE ITEMS) ×2 IMPLANT
DRAPE MICROSCOPE LEICA (MISCELLANEOUS) ×2 IMPLANT
DRAPE POUCH INSTRU U-SHP 10X18 (DRAPES) ×2 IMPLANT
DRAPE SURG 17X11 SM STRL (DRAPES) ×2 IMPLANT
DRAPE UTILITY XL STRL (DRAPES) ×2 IMPLANT
DRSG AQUACEL AG ADV 3.5X 4 (GAUZE/BANDAGES/DRESSINGS) IMPLANT
DRSG AQUACEL AG ADV 3.5X 6 (GAUZE/BANDAGES/DRESSINGS) ×1 IMPLANT
DURAPREP 26ML APPLICATOR (WOUND CARE) ×2 IMPLANT
DURASEAL SPINE SEALANT 3ML (MISCELLANEOUS) IMPLANT
ELECT BLADE TIP CTD 4 INCH (ELECTRODE) IMPLANT
ELECT REM PT RETURN 9FT ADLT (ELECTROSURGICAL) ×2
ELECTRODE REM PT RTRN 9FT ADLT (ELECTROSURGICAL) ×1 IMPLANT
GLOVE BIOGEL PI IND STRL 7.5 (GLOVE) ×1 IMPLANT
GLOVE BIOGEL PI INDICATOR 7.5 (GLOVE) ×1
GLOVE SURG SS PI 7.5 STRL IVOR (GLOVE) ×2 IMPLANT
GLOVE SURG SS PI 8.0 STRL IVOR (GLOVE) ×4 IMPLANT
GOWN STRL REUS W/TWL XL LVL3 (GOWN DISPOSABLE) ×4 IMPLANT
IV CATH 14GX2 1/4 (CATHETERS) IMPLANT
KIT BASIN OR (CUSTOM PROCEDURE TRAY) ×2 IMPLANT
KIT POSITIONING SURG ANDREWS (MISCELLANEOUS) ×2 IMPLANT
MANIFOLD NEPTUNE II (INSTRUMENTS) ×2 IMPLANT
NDL SPNL 18GX3.5 QUINCKE PK (NEEDLE) ×2 IMPLANT
NEEDLE SPNL 18GX3.5 QUINCKE PK (NEEDLE) ×4 IMPLANT
PACK LAMINECTOMY ORTHO (CUSTOM PROCEDURE TRAY) ×2 IMPLANT
PATTIES SURGICAL .5 X.5 (GAUZE/BANDAGES/DRESSINGS) IMPLANT
PATTIES SURGICAL .75X.75 (GAUZE/BANDAGES/DRESSINGS) IMPLANT
PATTIES SURGICAL 1X1 (DISPOSABLE) IMPLANT
SPONGE SURGIFOAM ABS GEL 100 (HEMOSTASIS) ×2 IMPLANT
STAPLER VISISTAT (STAPLE) ×1 IMPLANT
STRIP CLOSURE SKIN 1/2X4 (GAUZE/BANDAGES/DRESSINGS) ×1 IMPLANT
SUT NURALON 4 0 TR CR/8 (SUTURE) IMPLANT
SUT PROLENE 3 0 PS 2 (SUTURE) ×3 IMPLANT
SUT VIC AB 1 CT1 27 (SUTURE)
SUT VIC AB 1 CT1 27XBRD ANTBC (SUTURE) IMPLANT
SUT VIC AB 1-0 CT2 27 (SUTURE) ×2 IMPLANT
SUT VIC AB 2-0 CT1 27 (SUTURE) ×4
SUT VIC AB 2-0 CT1 TAPERPNT 27 (SUTURE) IMPLANT
SUT VIC AB 2-0 CT2 27 (SUTURE) ×2 IMPLANT
SYR 3ML LL SCALE MARK (SYRINGE) IMPLANT
TOWEL OR 17X26 10 PK STRL BLUE (TOWEL DISPOSABLE) ×2 IMPLANT
TOWEL OR NON WOVEN STRL DISP B (DISPOSABLE) IMPLANT
YANKAUER SUCT BULB TIP NO VENT (SUCTIONS) IMPLANT

## 2014-06-02 NOTE — Brief Op Note (Signed)
06/02/2014  9:47 AM  PATIENT:  Danielle Irwin  43 y.o. 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  PROCEDURE:  Procedure(s): MICRO LUMBAR DECOMPRESSION L4 - L5 ON THE RIGHT 1 LEVEL (Right)  SURGEON:  Surgeon(s) and Role:    * Johnn Hai, MD - Primary    * Magnus Sinning, MD - Assisting  PHYSICIAN ASSISTANT:   ASSISTANTS: [place]   ANESTHESIA:   general  EBL:  Total I/O In: -  Out: 200 [Urine:150; Blood:50]  BLOOD ADMINISTERED:none  DRAINS: none   LOCAL MEDICATIONS USED:  MARCAINE     SPECIMEN:  No Specimen  DISPOSITION OF SPECIMEN:  N/A  COUNTS:  YES  TOURNIQUET:  * No tourniquets in log *  DICTATION: .Other Dictation: Dictation Number [id number]  PLAN OF CARE: Admit for overnight observation  PATIENT DISPOSITION:  PACU - hemodynamically stable.   Delay start of Pharmacological VTE agent (>24hrs) due to surgical blood loss or risk of bleeding: yes

## 2014-06-02 NOTE — Anesthesia Postprocedure Evaluation (Signed)
  Anesthesia Post-op Note  Patient: Danielle Irwin  Procedure(s) Performed: Procedure(s) (LRB): MICRO LUMBAR DECOMPRESSION L4 - L5 ON THE RIGHT 1 LEVEL (Right)  Patient Location: PACU  Anesthesia Type: General  Level of Consciousness: awake and alert   Airway and Oxygen Therapy: Patient Spontanous Breathing  Post-op Pain: mild  Post-op Assessment: Post-op Vital signs reviewed, Patient's Cardiovascular Status Stable, Respiratory Function Stable, Patent Airway and No signs of Nausea or vomiting  Last Vitals:  Filed Vitals:   06/02/14 1445  BP: 129/62  Pulse: 97  Temp: 36.8 C  Resp: 16    Post-op Vital Signs: stable   Complications: No apparent anesthesia complications

## 2014-06-02 NOTE — Transfer of Care (Signed)
Immediate Anesthesia Transfer of Care Note  Patient: Danielle Irwin  Procedure(s) Performed: Procedure(s): MICRO LUMBAR DECOMPRESSION L4 - L5 ON THE RIGHT 1 LEVEL (Right)  Patient Location: PACU  Anesthesia Type:General  Level of Consciousness: sedated  Airway & Oxygen Therapy: Patient Spontanous Breathing and Patient connected to face mask oxygen  Post-op Assessment: Report given to PACU RN and Post -op Vital signs reviewed and stable  Post vital signs: Reviewed and stable  Complications: No apparent anesthesia complications

## 2014-06-02 NOTE — Op Note (Signed)
NAMEIVALENE, PLATTE                 ACCOUNT NO.:  [id number]  MEDICAL RECORD NO.:  [id number]  LOCATION:  [facility]                         FACILITY:  [facility]  PHYSICIAN:  Susa Day, M.D.    DATE OF BIRTH:  10/11/1970  DATE OF PROCEDURE:  06/02/2014 DATE OF DISCHARGE:                              OPERATIVE REPORT   PREOPERATIVE DIAGNOSES: 1. Spinal stenosis, herniated nucleus pulposus 4-5, right. 2. Morbid obesity with BMI of 52.  POSTOPERATIVE DIAGNOSES: 1. Spinal stenosis, herniated nucleus pulposus 4-5, right. 2. Morbid obesity with BMI of 52.  PROCEDURE PERFORMED: 1. Microlumbar decompression L4-5, right. 2. Foraminotomies of 4 and 5, right. 3. Microdiskectomy, 4-5.  ANESTHESIA:  General.  SURGEON:  Susa Day, M.D.  ASSISTANT:  Tarri Glenn, M.D.  HISTORY:  A 43 year old, with refractory disabling right lower extremity radicular pain L5 nerve root distribution secondary to lateral recess stenosis, spinal stenosis, HNP, EHL weakness, neural tension signs despite rest, activity, modification, corticosteroid injection, temporary relief.  MRI indicating pathology.  Unable to work.  Taking narcotic analgesics.  Indicated for decompression.  We discussed the risks and benefits including bleeding, infection, damage to neurovascular structures, DVT, PE, anesthetic complications, inability to perform the procedure due to the patient's elevated BMI of 52, and increased risk due to her size.  TECHNIQUE:  With the patient in supine position, after the induction of adequate general anesthesia, and 1500 mg of vancomycin, she was carefully meticulously placed on the Andrews frame with all bony prominences well padded.  The legs were secured, Foley to gravity.  All prominences were well padded.  Abdomen was free.  Cervical spine in neutral.  Lumbar region was prepped and draped in usual sterile fashion. Two 18-gauge spinal needles were utilized to localize L4-5  interspace, confirmed with x-ray.  Incision was made from the spinous process to 4- 5.  Subcutaneous tissue was dissected.  Electrocautery was utilized to achieve hemostasis in a significant subcutaneous adipose layer.  We used a Public affairs consultant to reach the dorsolumbar fascia.  We infiltrated the paraspinous musculature with 0.25% Marcaine with epinephrine, divided in line with skin incision.  We placed the long retractors in the laminotomy defect, placed a Penfield 4.  We obtained a confirmatory radiograph several times, was used to localize the 4-5 space.  Operating microscope was then draped and brought into the surgical field. Hemilaminotomy of the caudad edge of L4 was performed with 2 and 3 mm Kerrison preserving the pars.  It was hypertrophic facet.  We identified the superior articulating process of 5.  We used a small straight curette to detach the ligamentum flavum from the cephalad edge of 5.  We performed a foraminotomy of 5 detaching the ligamentum flavum.  The 5 root was compressed into the lateral recess.  We performed a generous foraminotomy, identified the 5 root, protected it medially with the D'Errico, decompressed the lateral recess to the medial border of the pedicle, hypertrophic facet.  Epidural venous plexus was noted and prominent epidural veins were cauterized.  After we gently mobilized the root medially, we identified the disk space, confirmed it with an x-ray with the Penfield 4 at the disk space.  Performed  a foraminotomy of 4. There was predominant hardened portion of the disk.  I performed an annulotomy and small portion of the disks were removed and the subligamentous space evacuated with the suction.  I felt the lesion was more of the lateral recess stenosis.  I checked beneath the thecal sac, the axilla of the root of 5, the shoulder of the root, the foramen of 4 and 5.  There was no residual disk herniation, 1 cm of excursion of the 5 root medial  to the pedicle was noted without tension.  Copiously irrigated the disk space and the laminotomy defect.  No evidence of active bleeding.  Bone wax was placed in the cancellous surfaces. Thrombin-soaked Gelfoam placed in the laminotomy defect.  Copiously irrigated the paraspinous musculature.  Dorsolumbar fascia closed with 1- Vicryl interrupted figure-of-eight sutures, subcu with multiple 2-0 Vicryl sutures.  The skin was reapproximated with staples.  Wound was dressed sterilely.  She was then meticulously and carefully transported back to the hospital bed with multiple assistance.  Foley to gravity. This was performed without difficulty.  She was extubated without difficulty, and transported to the recovery room in satisfactory condition.  The patient tolerated the procedure well.  No complications.  Minimal blood loss.     Susa Day, M.D.     Geralynn Rile  D:  06/02/2014  T:  06/02/2014  Job:  [id number]

## 2014-06-02 NOTE — Interval H&P Note (Signed)
History and Physical Interval Note:  06/02/2014 7:35 AM  Danielle Irwin  has presented today for surgery, with the diagnosis of hnp and stenosis L4 - L5 on the right  The various methods of treatment have been discussed with the patient and family. After consideration of risks, benefits and other options for treatment, the patient has consented to  Procedure(s): MICRO LUMBAR DECOMPRESSION L4 - L5 ON THE RIGHT 1 LEVEL (Right) as a surgical intervention .  The patient's history has been reviewed, patient examined, no change in status, stable for surgery.  I have reviewed the patient's chart and labs.  Questions were answered to the patient's satisfaction.     Graeme Menees C

## 2014-06-02 NOTE — H&P (View-Only) (Signed)
Saryah CHAELYN BUNYAN is an 43 y.o. female.   Chief Complaint: back and right leg pain HPI: The patient is a 43 year old female who presents today for follow up of their back. The patient is being followed for their low back symptoms. They are now 8 week(s) out from a flare up. Symptoms reported today include: pain and leg pain (right). The patient states that they are doing poorly. Current treatment includes: relative rest, activity modification, pain medications and use of a cane. The following medication has been used for pain control: Oxycodone (needs refill). The patient presents today following ESI right L4-5 x 2 weeks. The patient reports the injection did not help.  Ensley Davis follows up. She's having severe right lower extremity radicular pain into the top of her foot. She is here with her mother. She had an epidural. This one didn't seem to help like it has in the past. She's had a recent MRI which showed a further disc protrusion at 4-5 to the right. She has disc degeneration at 3-4, 4-5 and 5-1. She has a disc protrusion of 5-1 to the left but has no left-sided symptoms.  Past Medical History  Diagnosis Date  . Bronchitis, chronic     ? asthma  . OA (osteoarthritis)   . PCOS (polycystic ovarian syndrome)   . Depression   . Obesity   . Vitamin D deficiency     No past surgical history on file.  Family History  Problem Relation Age of Onset  . Heart attack Father 70   Social History:  reports that she has never smoked. She has never used smokeless tobacco. She reports that she does not drink alcohol or use illicit drugs.  Allergies:  Allergies  Allergen Reactions  . Bee Venom     REACTION: Hives and Swelling  . Bupropion Hcl     REACTION: Mood Changes  . Cephalexin     REACTION: Hives and Swelling     (Not in a hospital admission)  No results found for this or any previous visit (from the past 48 hour(s)). No results found.  Review of Systems  Constitutional:  Negative.   HENT: Negative.   Eyes: Negative.   Respiratory: Negative.   Cardiovascular: Negative.   Gastrointestinal: Negative.   Genitourinary: Negative.   Musculoskeletal: Positive for back pain.  Skin: Negative.   Neurological: Positive for focal weakness.    There were no vitals taken for this visit. Physical Exam  Constitutional: She is oriented to person, place, and time.  obese  HENT:  Head: Normocephalic and atraumatic.  Eyes: Conjunctivae and EOM are normal. Pupils are equal, round, and reactive to light.  Neck: Normal range of motion. Neck supple.  Cardiovascular: Normal rate and regular rhythm.   Respiratory: Effort normal and breath sounds normal.  GI: Soft. Bowel sounds are normal.  Musculoskeletal:  On exam, in moderate distress. SLR produces buttock, thigh and calf pain on the left, negative on the right, trace EHL weakness on the right compared to the left. Some discomfort in the LS junction.  Lumbar spine exam reveals no evidence of soft tissue swelling, ecchymosis or deformity. The abdomen is soft and nontender. Nontender over the trochanters. No cellulitis or lymphadenopathy.  Good range of motion of the lumbar spine without associated pain. Motor is 5/5 including tibialis anterior, plantar flexion, quadriceps and hamstrings. Patient is normoreflexic. There is no Babinski or clonus. Sensory exam is intact to light touch. The patient has good distal pulses.  No DVT. No pain and normal range of motion without instability of the hips, knees and ankles.   Neurological: She is alert and oriented to person, place, and time. She has normal reflexes.  Skin: Skin is warm and dry.  Psychiatric: She has a normal mood and affect.    MRI was reviewed. There is a paracentral disc herniation at 4-5 and affecting the 5 root and a paracentral disc herniation at 5-1 on the left.  Assessment/Plan 1. 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 2. Back pain secondary to multi-level disc degeneration at 3-4, 4-5 and 5-1. 3. Asymptomatic disc herniation at L5-S1 to the left. 4. Morbid obesity.  I had an extensive discussion concerning current pathology, relevant anatomy and treatment options. We discussed weight reduction. She indicates she has changed her diet and is trying to lose weight. She can't do any activity because of the pain into her leg. She cannot work. She's on leave. She needs to get back to work. She is here with her mother, on whom I have operated. She has asked about lumbar decompression. We indicated it's an option. Though it would not cure all her symptoms, I would isolate it to 4-5, given she's had relief of an epidural in the past at 4-5. There is pain in the L5 nerve root distribution. She really doesn't have any pain on the left-hand side. I would live the disc at 5-1 alone. We did discuss the probability of persistent back pain and possible recurrent disc herniation. I am concerned about her weight at 320. She is also allergic to Cephalexin. I indicated she would have residual symptoms and would need persistent restrictions, possibility of a fusion in the future. This has been going on for a significant period of time. She's taking Oxycodone. We will proceed accordingly. We have run out of options at this point in time. She's up for 4 hours then has to lie down with pain medicine.  Risks and benefits of this procedure were discussed with the patient including worsening of symptoms, no changes in symptoms, recurrent disc herniation, scar tissue, epidural fibrosis, damage to neurovascular structures, cerebral spinal fluid leak which would require repair or patching, DVT, PE, anesthetic complications, etc. We discussed the perioperative course, the hospitalization, and the need for postoperative rehabilitation and the time estimate for recovery. We also discussed the possibility of future surgery  including repeat decompression, fusion. The patient was provided an illustrated handout which was discussed in detail. Appropriate anatomic models were used as well.  Plan microlumbar decompression L4-5 right  BISSELL, JACLYN M. PA-C for Dr. Tonita Cong 05/17/2014, 12:59 PM

## 2014-06-02 NOTE — Progress Notes (Signed)
Pt shivering and begins to c/o more pain in back; meds given; Dr. Delma Post in to check pt; no whelps seen and skin is less red; OK to go to regular hospital room after pain more controlled

## 2014-06-02 NOTE — Evaluation (Signed)
Physical Therapy Evaluation Patient Details Name: Danielle Irwin MRN: [id number] DOB: 1971/05/06 Today's Date: 06/02/2014   History of Present Illness  L4-5 HNP s/p micro-lumbar decompression  Clinical Impression  Pt s/p back surgery as above presents with functional mobility limitations 2* post op pain and back precautions.  Pt should progress to d.c home with assist of family.   Follow Up Recommendations No PT follow up    Equipment Recommendations  None recommended by PT    Recommendations for Other Services OT consult     Precautions / Restrictions Precautions Precautions: Back Precaution Booklet Issued: Yes (comment) Precaution Comments: All precautions reviewed Restrictions Weight Bearing Restrictions: No      Mobility  Bed Mobility Overal bed mobility: Needs Assistance Bed Mobility: Supine to Sit;Sit to Supine     Supine to sit: Min assist Sit to supine: Min assist   General bed mobility comments: cues for sequence and correct log roll technique  Transfers Overall transfer level: Needs assistance Equipment used: Rolling walker (2 wheeled) Transfers: Sit to/from Stand Sit to Stand: Min guard         General transfer comment: cues for transition position and use of UEs to self assist  Ambulation/Gait Ambulation/Gait assistance: Min assist;Min guard Ambulation Distance (Feet): 120 Feet (and 15' into bathroom) Assistive device: Rolling walker (2 wheeled) Gait Pattern/deviations: Step-to pattern;Step-through pattern;Decreased step length - right;Decreased step length - left;Shuffle;Trunk flexed Gait velocity: decr   General Gait Details: cues for posture and position from [employer]            Wheelchair Mobility    Modified Rankin (Stroke Patients Only)       Balance                                             Pertinent Vitals/Pain Pain Assessment: 0-10 Pain Score: 6  Pain Location: back Pain Descriptors /  Indicators: Aching;Sore Pain Intervention(s): Limited activity within patient's tolerance;Monitored during session;Premedicated before session    [place] expects to be discharged to:: Private residence Living Arrangements: Parent Available Help at Discharge: Family Type of Home: House Home Access: Stairs to enter Entrance Stairs-Rails: None Entrance Stairs-Number of Steps: 3 Home Layout: One level;Able to live on main level with bedroom/bathroom Home Equipment: Gilford Rile - 2 wheels;Bedside commode      Prior Function Level of Independence: Independent               Hand Dominance        Extremity/Trunk Assessment   Upper Extremity Assessment: Overall WFL for tasks assessed           Lower Extremity Assessment: Overall WFL for tasks assessed      Cervical / Trunk Assessment: Normal  Communication   Communication: No difficulties  Cognition Arousal/Alertness: Awake/alert Behavior During Therapy: WFL for tasks assessed/performed Overall Cognitive Status: Within Functional Limits for tasks assessed                      General Comments      Exercises        Assessment/Plan    PT Assessment Patient needs continued PT services  PT Diagnosis Difficulty walking   PT Problem List Decreased activity tolerance;Decreased mobility;Decreased knowledge of use of DME;Obesity;Pain;Decreased knowledge of precautions  PT Treatment Interventions DME instruction;Gait training;Stair training;Functional mobility training;Therapeutic activities;Therapeutic exercise;Patient/family education  PT Goals (Current goals can be found in the Care Plan section) Acute Rehab PT Goals Patient Stated Goal: Be able to return to work PT Goal Formulation: With patient Time For Goal Achievement: 06/09/14 Potential to Achieve Goals: Good    Frequency Min 6X/week   Barriers to discharge        Co-evaluation               End of Session   Activity  Tolerance: Patient tolerated treatment well Patient left: in bed;with call bell/phone within reach;with family/visitor present Nurse Communication: Mobility status    Functional Assessment Tool Used: Clinical judgement Functional Limitation: Mobility: Walking and moving around Mobility: Walking and Moving Around Current Status (R9458): At least 20 percent but less than 40 percent impaired, limited or restricted Mobility: Walking and Moving Around Goal Status [phone]): At least 1 percent but less than 20 percent impaired, limited or restricted    Time: 1400-1440 PT Time Calculation (min) (ACUTE ONLY): 40 min   Charges:   PT Evaluation $Initial PT Evaluation Tier I: 1 Procedure PT Treatments $Gait Training: 8-22 mins $Therapeutic Activity: 8-22 mins   PT G Codes:   PT G-Codes **NOT FOR INPATIENT CLASS** Functional Assessment Tool Used: Clinical judgement Functional Limitation: Mobility: Walking and moving around Mobility: Walking and Moving Around Current Status (K4628): At least 20 percent but less than 40 percent impaired, limited or restricted Mobility: Walking and Moving Around Goal Status [phone]): At least 1 percent but less than 20 percent impaired, limited or restricted    [facility] 06/02/2014, 4:26 PM

## 2014-06-02 NOTE — Anesthesia Preprocedure Evaluation (Signed)
Anesthesia Evaluation  Patient identified by MRN, date of birth, ID band Patient awake    Reviewed: Allergy & Precautions, H&P , NPO status , Patient's Chart, lab work & pertinent test results  Airway Mallampati: II  TM Distance: >3 FB Neck ROM: Full    Dental no notable dental hx.    Pulmonary asthma , former smoker,  breath sounds clear to auscultation  Pulmonary exam normal       Cardiovascular negative cardio ROS  Rhythm:Regular Rate:Normal     Neuro/Psych  Headaches, PSYCHIATRIC DISORDERS Depression    GI/Hepatic negative GI ROS, Neg liver ROS,   Endo/Other  Morbid obesity  Renal/GU negative Renal ROS  negative genitourinary   Musculoskeletal  (+) Arthritis -,   Abdominal (+) + obese,   Peds negative pediatric ROS (+)  Hematology negative hematology ROS (+)   Anesthesia Other Findings   Reproductive/Obstetrics negative OB ROS                             Anesthesia Physical Anesthesia Plan  ASA: III  Anesthesia Plan: General   Post-op Pain Management:    Induction: Intravenous  Airway Management Planned: Oral ETT  Additional Equipment:   Intra-op Plan:   Post-operative Plan: Extubation in OR  Informed Consent: I have reviewed the patients History and Physical, chart, labs and discussed the procedure including the risks, benefits and alternatives for the proposed anesthesia with the patient or authorized representative who has indicated his/her understanding and acceptance.   Dental advisory given  Plan Discussed with: CRNA  Anesthesia Plan Comments:         Anesthesia Quick Evaluation

## 2014-06-02 NOTE — Progress Notes (Signed)
PACU note----pt's skin reddened, whelps noted left arm, pt states slight itching; Dr. Delma Post in, order rec'd for benadryl IV, med given

## 2014-06-02 NOTE — Progress Notes (Addendum)
PHARMACIST - PHYSICIAN ORDER COMMUNICATION  CONCERNING: P&T Medication Policy on Herbal Medications  DESCRIPTION:  This patient's order for:  Melatonin and St John's Wort  has been noted.  This product(s) is classified as an "herbal" or natural product. Due to a lack of definitive safety studies or FDA approval, nonstandard manufacturing practices, plus the potential risk of unknown drug-drug interactions while on inpatient medications, the Pharmacy and Therapeutics Committee does not permit the use of "herbal" or natural products of this type within [facility].   ACTION TAKEN: The pharmacy department is unable to verify this order at this time and your patient has been informed of this safety policy. Please reevaluate patient's clinical condition at discharge and address if the herbal or natural product(s) should be resumed at that time.

## 2014-06-03 DIAGNOSIS — M4806 Spinal stenosis, lumbar region: Secondary | ICD-10-CM | POA: Diagnosis not present

## 2014-06-03 NOTE — Discharge Instructions (Signed)

## 2014-06-03 NOTE — Progress Notes (Signed)
Subjective: 1 Day Post-Op Procedure(s) (LRB): MICRO LUMBAR DECOMPRESSION L4 - L5 ON THE RIGHT 1 LEVEL (Right) Patient reports pain as moderate.  Controlled with oral pain meds.  Urinating without difficulty.  Denies LE numbness, tingling or weakness.  OOb with PT walking the halls this morning.  No BM but passing gas.  Tolerating regular diet.  Objective: Vital signs in last 24 hours: Temp:  [97.5 F (36.4 C)-98.9 F (37.2 C)] 98.2 F (36.8 C) (12/25 0627) Pulse Rate:  [62-97] 73 (12/25 0627) Resp:  [9-18] 17 (12/25 0627) BP: (109-156)/(52-77) 156/77 mmHg (12/25 0627) SpO2:  [96 %-100 %] 99 % (12/25 0627)  Intake/Output from previous day: 12/24 0701 - 12/25 0700 In: 3796.3 [P.O.:240; I.V.:3056.3; IV Piggyback:500] Out: 2150 [Urine:2100; Blood:50] Intake/Output this shift: Total I/O In: -  Out: 150 [Urine:150]  No results for input(s): HGB in the last 72 hours. No results for input(s): WBC, RBC, HCT, PLT in the last 72 hours. No results for input(s): NA, K, CL, CO2, BUN, CREATININE, GLUCOSE, CALCIUM in the last 72 hours. No results for input(s): LABPT, INR in the last 72 hours.  PE:  obese woman in nad.  B LEs with intact sens to LT and 5/5 strength with PF and DF of the ankles and toes.  Assessment/Plan: 1 Day Post-Op Procedure(s) (LRB): MICRO LUMBAR DECOMPRESSION L4 - L5 ON THE RIGHT 1 LEVEL (Right) D/c home today.  Wylene Simmer 06/03/2014, 10:05 AM

## 2014-06-03 NOTE — Progress Notes (Signed)
Physical Therapy Treatment Patient Details Name: Danielle Irwin MRN: [id number] DOB: 1971-03-22 Today's Date: 06/03/2014    History of Present Illness L4-5 HNP s/p micro-lumbar decompression    PT Comments    Progressing well and eager for d/c  Follow Up Recommendations  No PT follow up     Equipment Recommendations  None recommended by PT    Recommendations for Other Services OT consult     Precautions / Restrictions Precautions Precautions: Back Precaution Booklet Issued: Yes (comment) Precaution Comments: All precautions reviewed Restrictions Weight Bearing Restrictions: No    Mobility  Bed Mobility Overal bed mobility: Modified Independent Bed Mobility: Sit to Supine       Sit to supine: Modified independent (Device/Increase time)   General bed mobility comments: min cues for log roll technique  Transfers Overall transfer level: Needs assistance Equipment used: Rolling walker (2 wheeled) Transfers: Sit to/from Stand Sit to Stand: Supervision         General transfer comment: good use of precautions  Ambulation/Gait Ambulation/Gait assistance: Min guard;Supervision Ambulation Distance (Feet): 380 Feet (and to/from bathroom) Assistive device: Rolling walker (2 wheeled) Gait Pattern/deviations: Step-through pattern;Decreased step length - right;Decreased step length - left;Shuffle Gait velocity: decr   General Gait Details: cues for posture and position from RW   Stairs Stairs: Yes Stairs assistance: Min guard Stair Management: One rail Left Number of Stairs: 2 General stair comments: cues for sequence  Wheelchair Mobility    Modified Rankin (Stroke Patients Only)       Balance Overall balance assessment: No apparent balance deficits (not formally assessed)                                  Cognition Arousal/Alertness: Awake/alert Behavior During Therapy: WFL for tasks assessed/performed Overall Cognitive Status: Within  Functional Limits for tasks assessed                      Exercises      General Comments        Pertinent Vitals/Pain Pain Assessment: 0-10 Pain Score: 6  Pain Location: back Pain Descriptors / Indicators: Aching;Sore Pain Intervention(s): Limited activity within patient's tolerance;Monitored during session;Patient requesting pain meds-RN notified    Home Living Family/patient expects to be discharged to:: Private residence Living Arrangements: Parent Available Help at Discharge: Family Type of Home: House Home Access: Stairs to enter Entrance Stairs-Rails: None Home Layout: One level;Able to live on main level with bedroom/bathroom Home Equipment: Gilford Rile - 2 wheels;Bedside commode;Grab bars - tub/shower;Hand held shower head;Shower seat - built in;Toilet riser      Prior Function Level of Independence: Independent          PT Goals (current goals can now be found in the care plan section) Acute Rehab PT Goals Patient Stated Goal: Be able to return to work PT Goal Formulation: With patient Time For Goal Achievement: 06/09/14 Potential to Achieve Goals: Good Progress towards PT goals: Progressing toward goals    Frequency  Min 6X/week    PT Plan Current plan remains appropriate    Co-evaluation             End of Session   Activity Tolerance: Patient tolerated treatment well Patient left: in bed;with call bell/phone within reach     Time: 0905-0933 PT Time Calculation (min) (ACUTE ONLY): 28 min  Charges:  $Gait Training: 8-22 mins $Therapeutic Activity: 8-22 mins  G Codes:      Wadell Craddock Jun 27, 2014, 10:52 AM

## 2014-06-03 NOTE — Progress Notes (Signed)
Occupational Therapy Evaluation Patient Details Name: Danielle Irwin MRN: [id number] DOB: [birth year] Today's Date: 06/03/2014    History of Present Illness L4-5 HNP s/p micro-lumbar decompression   Clinical Impression   Pt making excellent progress. Completed all education regarding ADL and functional mobility for ADL with use of compensatory techniques, AE and DME. Pt ready to D/C when medically stable.     Follow Up Recommendations  No OT follow up;Supervision - Intermittent    Equipment Recommendations  None recommended by OT    Recommendations for Other Services       Precautions / Restrictions Precautions Precautions: Back Precaution Booklet Issued: Yes (comment) Precaution Comments: All precautions reviewed Restrictions Weight Bearing Restrictions: No      Mobility Bed Mobility               General bed mobility comments: pt up in chair. Reviewed log rolling technique and educated on alternative positioning in bed and moving in bed on side instead of back to limit arching of back.  Transfers Overall transfer level: Needs assistance Equipment used: Rolling walker (2 wheeled)   Sit to Stand: Supervision         General transfer comment: good use of precautions    Balance Overall balance assessment: No apparent balance deficits (not formally assessed)                                          ADL Overall ADL's : Needs assistance/impaired     Grooming: Set up   Upper Body Bathing: Set up   Lower Body Bathing: Moderate assistance;Sit to/from stand   Upper Body Dressing : Set up   Lower Body Dressing: Moderate assistance;Sit to/from stand   Toilet Transfer: Supervision/safety;Ambulation   Toileting- Clothing Manipulation and Hygiene: Moderate assistance;Sit to/from stand   Tub/ Shower Transfer: Supervision/safety   Functional mobility during ADLs: Supervision/safety General ADL Comments: Educated pt on use and availability  of AE for LB ADL and hygiene after toileting to adhere to back precautions. Pt able to return demonstrate use of AE.     Vision                     Perception     Praxis      Pertinent Vitals/Pain Pain Assessment: 0-10 Pain Score: 5  Pain Location: back Pain Descriptors / Indicators: Aching Pain Intervention(s): Limited activity within patient's tolerance;Monitored during session;Repositioned     Hand Dominance     Extremity/Trunk Assessment Upper Extremity Assessment Upper Extremity Assessment: Overall WFL for tasks assessed   Lower Extremity Assessment Lower Extremity Assessment: Defer to PT evaluation   Cervical / Trunk Assessment Cervical / Trunk Assessment: Normal   Communication Communication Communication: No difficulties   Cognition Arousal/Alertness: Awake/alert Behavior During Therapy: WFL for tasks assessed/performed Overall Cognitive Status: Within Functional Limits for tasks assessed                     General Comments       Exercises       Shoulder Instructions      Home Living Family/patient expects to be discharged to:: Private residence Living Arrangements: Parent Available Help at Discharge: Family Type of Home: House Home Access: Stairs to enter Technical brewer of Steps: 3 Entrance Stairs-Rails: None Home Layout: One level;Able to live on main level with bedroom/bathroom  Bathroom Shower/Tub: Occupational psychologist: Standard Bathroom Accessibility: No   Home Equipment: Environmental consultant - 2 wheels;Bedside commode;Grab bars - tub/shower;Hand held shower head;Shower seat - built in;Toilet riser          Prior Functioning/Environment Level of Independence: Independent             OT Diagnosis: Acute pain;Generalized weakness   OT Problem List: Decreased range of motion;Decreased activity tolerance;Decreased knowledge of use of DME or AE;Decreased knowledge of precautions;Obesity;Pain   OT  Treatment/Interventions:      OT Goals(Current goals can be found in the care plan section) Acute Rehab OT Goals Patient Stated Goal: Be able to return to work OT Goal Formulation:  (eval only)  OT Frequency:     Barriers to D/C:            Co-evaluation              End of Session Equipment Utilized During Treatment: Surveyor, mining Communication: Mobility status  Activity Tolerance: Patient tolerated treatment well Patient left: Other (comment) (with PT)   Time: 2947-6546 OT Time Calculation (min): 26 min Charges:  OT General Charges $OT Visit: 1 Procedure OT Evaluation $Initial OT Evaluation Tier I: 1 Procedure OT Treatments $Self Care/Home Management : 8-22 mins G-Codes: OT G-codes **NOT FOR INPATIENT CLASS** Functional Assessment Tool Used: clinical judgement Functional Limitation: Self care Self Care Current Status (T0354): At least 20 percent but less than 40 percent impaired, limited or restricted Self Care Goal Status (S5681): At least 1 percent but less than 20 percent impaired, limited or restricted Self Care Discharge Status [phone]): At least 1 percent but less than 20 percent impaired, limited or restricted  [facility] 06/03/2014, 9:12 AM   [facility], OTR/L  [phone] 06/03/2014

## 2014-06-04 NOTE — Care Management Note (Signed)
    Page 1 of 1   06/04/2014     9:28:12 AM CARE MANAGEMENT NOTE 06/04/2014  Patient:  LEIANNE, CALLINS   Account Number:  [id number]  Date Initiated:  06/04/2014  Documentation initiated by:  [facility]  Subjective/Objective Assessment:   adm: MICRO LUMBAR DECOMPRESSION L4 - L5 ON THE RIGHT 1 LEVEL (Right)     Action/Plan:   home self care   Anticipated DC Date:  06/02/2014   Anticipated DC Plan:  HOME/SELF CARE         Choice offered to / List presented to:             Status of service:   Medicare Important Message given?   (If response is "NO", the following Medicare IM given date fields will be blank) Date Medicare IM given:   Medicare IM given by:   Date Additional Medicare IM given:   Additional Medicare IM given by:    Discharge Disposition:  HOME/SELF CARE  Per UR Regulation:    If discussed at Long Length of Stay Meetings, dates discussed:    Comments:  06/04/14 09:26 CM notes no Pt/OT follow up recc.  No other CM needs were communicated.  Pt home self care.  Mariane Masters, BSN, CM [phone].

## 2014-06-06 ENCOUNTER — Encounter (HOSPITAL_COMMUNITY): Payer: Self-pay | Admitting: Specialist

## 2014-06-06 NOTE — Discharge Summary (Signed)
Physician Discharge Summary   Patient ID: Danielle Irwin MRN: [id number] DOB/AGE: 1971-01-13 43 y.o.  Admit date: 06/02/2014 Discharge date: 06/06/2014  Primary Diagnosis:   herniated nucleus pulposa and stenosis L4 - L5 on the right  Admission Diagnoses:  Past Medical History  Diagnosis Date  . OA (osteoarthritis)   . PCOS (polycystic ovarian syndrome)   . Depression   . Obesity   . Vitamin D deficiency   . Family history of adverse reaction to anesthesia     FATHER WAS SLOW TO WAKE UP  . Asthma   . Headache     OCCASIONAL MIGRAINES  . HNP (herniated nucleus pulposus), lumbar   . Sensitive skin   . Insomnia   . Hx of epistaxis    Discharge Diagnoses:   Active Problems:   Spinal stenosis at L4-L5 level  Procedure:  Procedure(s) (LRB): MICRO LUMBAR DECOMPRESSION L4 - L5 ON THE RIGHT 1 LEVEL (Right)   Consults: None  HPI:  see H&P    Laboratory Data: Hospital Outpatient Visit on 05/26/2014  Component Date Value Ref Range Status  . Sodium 05/26/2014 140  137 - 147 mEq/L Final  . Potassium 05/26/2014 4.0  3.7 - 5.3 mEq/L Final  . Chloride 05/26/2014 101  96 - 112 mEq/L Final  . CO2 05/26/2014 26  19 - 32 mEq/L Final  . Glucose, Bld 05/26/2014 125* 70 - 99 mg/dL Final  . BUN 05/26/2014 10  6 - 23 mg/dL Final  . Creatinine, Ser 05/26/2014 0.88  0.50 - 1.10 mg/dL Final  . Calcium 05/26/2014 9.4  8.4 - 10.5 mg/dL Final  . GFR calc non Af Amer 05/26/2014 79* >90 mL/min Final  . GFR calc Af Amer 05/26/2014 >90  >90 mL/min Final   Comment: (NOTE) The eGFR has been calculated using the CKD EPI equation. This calculation has not been validated in all clinical situations. eGFR's persistently <90 mL/min signify possible Chronic Kidney Disease.   . Anion gap 05/26/2014 13  5 - 15 Final  . WBC 05/26/2014 8.0  4.0 - 10.5 K/uL Final  . RBC 05/26/2014 4.28  3.87 - 5.11 MIL/uL Final  . Hemoglobin 05/26/2014 13.1  12.0 - 15.0 g/dL Final  . HCT 05/26/2014 38.8  36.0 - 46.0 %  Final  . MCV 05/26/2014 90.7  78.0 - 100.0 fL Final  . MCH 05/26/2014 30.6  26.0 - 34.0 pg Final  . MCHC 05/26/2014 33.8  30.0 - 36.0 g/dL Final  . RDW 05/26/2014 12.5  11.5 - 15.5 % Final  . Platelets 05/26/2014 241  150 - 400 K/uL Final  . Preg, Serum 05/26/2014 NEGATIVE  NEGATIVE Final   Comment:        THE SENSITIVITY OF THIS METHODOLOGY IS >10 mIU/mL.   . MRSA, PCR 05/26/2014 NEGATIVE  NEGATIVE Final  . Staphylococcus aureus 05/26/2014 NEGATIVE  NEGATIVE Final   Comment:        The Xpert SA Assay (FDA approved for NASAL specimens in patients over 78 years of age), is one component of a comprehensive surveillance program.  Test performance has been validated by [employer] for patients greater than or equal to 10 year old. It is not intended to diagnose infection nor to guide or monitor treatment.    No results for input(s): HGB in the last 72 hours. No results for input(s): WBC, RBC, HCT, PLT in the last 72 hours. No results for input(s): NA, K, CL, CO2, BUN, CREATININE, GLUCOSE, CALCIUM in the last 72  hours. No results for input(s): LABPT, INR in the last 72 hours.  X-Rays:Dg Spine Portable 1 View  06/02/2014   CLINICAL DATA:  Lumbar decompression  EXAM: PORTABLE SPINE - 1 VIEW  COMPARISON:  None.  FINDINGS: Film labeled 8:38 a.m. Metallic probe tip is posterior to the L4-5 interspace level. No fracture or spondylolisthesis. A surgical clamp is noted superiorly with the proximal aspect of this clamp overlying the L2-3 interspace level.  IMPRESSION: Metallic probe tip overlies the inferior, posterior aspect of the L4-5 disc space level. No fracture or spondylolisthesis.   Electronically Signed   By: Lowella Grip M.D.   On: 06/02/2014 09:22   Dg Spine Portable 1 View  06/02/2014   CLINICAL DATA:  Lumbar decompression L4-5.  EXAM: PORTABLE SPINE - 1 VIEW  COMPARISON:  06/02/2014  FINDINGS: Lowest disc spaces assigned L5-S1 consistent with the earlier study.  Surgical  instrument is present overlying the spinal canal at the mid L5 level.  IMPRESSION: Surgical instrument is present at the mid L5 level.   Electronically Signed   By: Franchot Gallo M.D.   On: 06/02/2014 08:45   Dg Spine Portable 1 View  06/02/2014   CLINICAL DATA:  Intraoperative lumbar decompression  EXAM: PORTABLE SPINE - 1 VIEW  COMPARISON:  None.  FINDINGS: Cross-table lateral lumbar spine image obtained. A metallic probe is posterior to the L3-4 interspace level ; the tip is directly posterior to the mid L3 spinous process. More inferiorly, a second metallic probe tip is posterior to the L4-5 level. No fracture or spondylolisthesis. Moderate disc space narrowing at L4-5 and L5-S1.  IMPRESSION: Metallic probe tips are posterior to the L3-4 and L4-5 interspace levels respectively.   Electronically Signed   By: Lowella Grip M.D.   On: 06/02/2014 08:23    EKG: Orders placed or performed in visit on 04/25/14  . EKG 12-Lead     Hospital Course: Patient was admitted to [facility] and taken to the OR and underwent the above state procedure without complications.  Patient tolerated the procedure well and was later transferred to the recovery room and then to the orthopaedic floor for postoperative care.  They were given PO and IV analgesics for pain control following their surgery.  They were given 24 hours of postoperative antibiotics.   PT was consulted postop to assist with mobility and transfers.  The patient was allowed to be WBAT with therapy and was taught back precautions. Discharge planning was consulted to help with postop disposition and equipment needs.  Patient had a fair night on the evening of surgery and started to get up OOB with therapy on day one. Patient was seen in rounds and was ready to go home on day one.  They were given discharge instructions and dressing directions.  They were instructed on when to follow up in the office with Dr. Tonita Cong.   Diet: Regular  diet Activity:WBAT; Lspine precautions Follow-up:in 10-14 days Disposition - Home Discharged Condition: good   Discharge Instructions    Call MD / Call 911    Complete by:  As directed   If you experience chest pain or shortness of breath, CALL 911 and be transported to the hospital emergency room.  If you develope a fever above 101 F, pus (white drainage) or increased drainage or redness at the wound, or calf pain, call your surgeon's office.     Constipation Prevention    Complete by:  As directed   Drink plenty of  fluids.  Prune juice may be helpful.  You may use a stool softener, such as Colace (over the counter) 100 mg twice a day.  Use MiraLax (over the counter) for constipation as needed.     Diet - low sodium heart healthy    Complete by:  As directed      Increase activity slowly as tolerated    Complete by:  As directed             Medication List    STOP taking these medications        cyclobenzaprine 10 MG tablet  Commonly known as:  FLEXERIL     oxyCODONE-acetaminophen 5-325 MG per tablet  Commonly known as:  PERCOCET/ROXICET  Replaced by:  oxyCODONE-acetaminophen 7.5-325 MG per tablet      TAKE these medications        acetaminophen 650 MG CR tablet  Commonly known as:  TYLENOL  Take 1,300 mg by mouth every 8 (eight) hours as needed for pain.     albuterol 108 (90 BASE) MCG/ACT inhaler  Commonly known as:  VENTOLIN HFA  Inhale 2 puffs into the lungs every 6 (six) hours as needed.     ANTACID ANTI-GAS PO  Take 1 tablet by mouth daily as needed (for gas).     B-complex with vitamin C tablet  Take 1 tablet by mouth daily.     calcium carbonate 500 MG chewable tablet  Commonly known as:  TUMS - dosed in mg elemental calcium  Chew 1 tablet by mouth daily.     calcium-vitamin D 500-200 MG-UNIT per tablet  Commonly known as:  OSCAL WITH D  Take 1 tablet by mouth daily with breakfast.     docusate sodium 100 MG capsule  Commonly known as:  COLACE   Take 1 capsule (100 mg total) by mouth 2 (two) times daily as needed for mild constipation.     doxylamine (Sleep) 25 MG tablet  Commonly known as:  UNISOM  Take 25 mg by mouth at bedtime as needed for sleep.     ECHINACEA-GOLDEN SEAL PO  Take 1 tablet by mouth 2 (two) times daily.     magnesium 30 MG tablet  Take 30 mg by mouth daily.     Melatonin 10 MG Tabs  Take 1 tablet by mouth at bedtime as needed (for sleep).     methocarbamol 500 MG tablet  Commonly known as:  ROBAXIN  Take 1 tablet (500 mg total) by mouth 3 (three) times daily between meals as needed for muscle spasms.     multivitamin tablet  Take 1 tablet by mouth daily.     norethindrone-ethinyl estradiol-iron 1.5-30 MG-MCG tablet  Commonly known as:  MICROGESTIN FE,GILDESS FE,LOESTRIN FE  Take 1 tablet by mouth daily.     NYQUIL COLD & FLU PO  Take 30 mLs by mouth at bedtime as needed (cold/flu).     oxyCODONE-acetaminophen 7.5-325 MG per tablet  Commonly known as:  PERCOCET  Take 1 tablet by mouth every 4 (four) hours as needed for pain.     pseudoephedrine 120 MG 12 hr tablet  Commonly known as:  SUDAFED  Take 120 mg by mouth every 12 (twelve) hours as needed for congestion.     St Johns Wort 300 MG Caps  Take 3 capsules by mouth 2 (two) times daily.     vitamin C 500 MG tablet  Commonly known as:  ASCORBIC ACID  Take 500 mg by mouth 2 (  two) times daily.     Vitamin D 2000 UNITS Caps  Take 1 capsule by mouth daily.     ZYRTEC-D ALLERGY & CONGESTION 5-120 MG per tablet  Generic drug:  cetirizine-pseudoephedrine  Take 1 tablet by mouth daily.           Follow-up Information    Follow up with BEANE,JEFFREY C, MD In 2 weeks.   Specialty:  Orthopedic Surgery   Contact information:   [address] [place] [address] [phone]       Signed: Lacie Draft, PA-C Orthopaedic Surgery 06/06/2014, 8:38 AM

## 2014-06-21 ENCOUNTER — Ambulatory Visit (INDEPENDENT_AMBULATORY_CARE_PROVIDER_SITE_OTHER): Payer: BLUE CROSS/BLUE SHIELD | Admitting: Family Medicine

## 2014-06-21 ENCOUNTER — Encounter: Payer: Self-pay | Admitting: Family Medicine

## 2014-06-21 VITALS — BP 144/94 | HR 90 | Temp 98.2°F | Wt 343.8 lb

## 2014-06-21 DIAGNOSIS — G4700 Insomnia, unspecified: Secondary | ICD-10-CM

## 2014-06-21 DIAGNOSIS — J069 Acute upper respiratory infection, unspecified: Secondary | ICD-10-CM | POA: Insufficient documentation

## 2014-06-21 DIAGNOSIS — F419 Anxiety disorder, unspecified: Secondary | ICD-10-CM

## 2014-06-21 MED ORDER — AZITHROMYCIN 250 MG PO TABS: ORAL_TABLET | ORAL | Status: DC

## 2014-06-21 MED ORDER — TRAZODONE HCL 50 MG PO TABS: 25.0000 mg | ORAL_TABLET | Freq: Every evening | ORAL | Status: DC | PRN

## 2014-06-21 MED ORDER — HYDROCOD POLST-CHLORPHEN POLST 10-8 MG/5ML PO LQCR: 5.0000 mL | Freq: Every evening | ORAL | Status: DC | PRN

## 2014-06-21 NOTE — Assessment & Plan Note (Signed)
Deteriorated. We discussed tx options and agreed we should work on her insomnia first and if no improvement in her anxiety once she is sleeping better, will discuss another anxiety tx. The patient indicates understanding of these issues and agrees with the plan.

## 2014-06-21 NOTE — Assessment & Plan Note (Signed)
New- Given duration and progression of symptoms along with recent hospitalization, will treat for bacterial bronchitis with zpack. Supportive care discussed. Also given rx for tussionex to use prn cough at night- discussed sedation precautions and NOT to take with her pain medication.

## 2014-06-21 NOTE — Patient Instructions (Addendum)
Good to see you. Please take zpack as directed.  Start Trazodone as directed for sleep.  Call me in a few weeks with an update. We may need to add additional medication for anxiety.

## 2014-06-21 NOTE — Assessment & Plan Note (Signed)
Deteriorated. >25 min spent with patient, at least half of which was spent on counseling insomnia.  The problem of recurrent insomnia is discussed. Avoidance of caffeine sources is strongly encouraged. Sleep hygiene issues are reviewed. eRx sent for trazodone to use nightly prn insomnia. She will call me in 2 weeks with an update. The patient indicates understanding of these issues and agrees with the plan.

## 2014-06-21 NOTE — Progress Notes (Signed)
Pre visit review using our clinic review tool, if applicable. No additional management support is needed unless otherwise documented below in the visit note. 

## 2014-06-21 NOTE — Progress Notes (Signed)
Subjective:   Patient ID: Danielle Irwin, female    DOB: 05/15/1971, 44 y.o.   MRN: [id number]  HAROLDINE REDLER is a pleasant 44 y.o. year old female who presents to clinic today with Cough; Dizziness; Insomnia; and Anxiety  on 06/21/2014  HPI:  S/p lumbar laminectomy/decompression by Dr. Tonita Cong on 06/02/14. Feeling like she is recovering well from her back surgery.  She has needed any hydrocodone in over a week. Starting PT next week- she is pleased with outcome of her surgery so far.  While she was in the hospital, developed a cough which has since progressed and now productive of green phlegm.  Has not had a fever but has felt feverish.  No nausea or vomiting.  Having difficult time sleeping and cough making it worse.  Increased anxiety over past couple of months- worried about her job, her parents' health, etc.  "cannot turn mind off" at night.  Taking melatonin and [place]ohn[place] wart without improvement in insomnia.  Depressive symptoms have improved.  Current Outpatient Prescriptions on File Prior to Visit  Medication Sig Dispense Refill  . acetaminophen (TYLENOL) 650 MG CR tablet Take 1,300 mg by mouth every 8 (eight) hours as needed for pain.    Marland Kitchen albuterol (VENTOLIN HFA) 108 (90 BASE) MCG/ACT inhaler Inhale 2 puffs into the lungs every 6 (six) hours as needed. (Patient taking differently: Inhale 2 puffs into the lungs every 6 (six) hours as needed for wheezing. ) 1 Inhaler 1  . Alum & Mag Hydroxide-Simeth (ANTACID ANTI-GAS PO) Take 1 tablet by mouth daily as needed (for gas).    . B Complex-C (B-COMPLEX WITH VITAMIN C) tablet Take 1 tablet by mouth daily.    . calcium carbonate (TUMS - DOSED IN MG ELEMENTAL CALCIUM) 500 MG chewable tablet Chew 1 tablet by mouth daily.      . calcium-vitamin D (OSCAL WITH D) 500-200 MG-UNIT per tablet Take 1 tablet by mouth daily with breakfast.    . cetirizine-pseudoephedrine (ZYRTEC-D ALLERGY & CONGESTION) 5-120 MG per tablet Take 1 tablet by mouth daily.      . Cholecalciferol (VITAMIN D) 2000 UNITS CAPS Take 1 capsule by mouth daily.      Marland Kitchen DM-Doxylamine-Acetaminophen (NYQUIL COLD & FLU PO) Take 30 mLs by mouth at bedtime as needed (cold/flu).    Marland Kitchen docusate sodium (COLACE) 100 MG capsule Take 1 capsule (100 mg total) by mouth 2 (two) times daily as needed for mild constipation. 20 capsule 1  . doxylamine, Sleep, (UNISOM) 25 MG tablet Take 25 mg by mouth at bedtime as needed for sleep.    Marland Kitchen ECHINACEA-GOLDEN SEAL PO Take 1 tablet by mouth 2 (two) times daily.    . magnesium 30 MG tablet Take 30 mg by mouth daily.    . Melatonin 10 MG TABS Take 1 tablet by mouth at bedtime as needed (for sleep).    . methocarbamol (ROBAXIN) 500 MG tablet Take 1 tablet (500 mg total) by mouth 3 (three) times daily between meals as needed for muscle spasms. 40 tablet 1  . Multiple Vitamin (MULTIVITAMIN) tablet Take 1 tablet by mouth daily.      . norethindrone-ethinyl estradiol-iron (MICROGESTIN FE,GILDESS FE,LOESTRIN FE) 1.5-30 MG-MCG tablet Take 1 tablet by mouth daily.    Marland Kitchen oxyCODONE-acetaminophen (PERCOCET) 7.5-325 MG per tablet Take 1 tablet by mouth every 4 (four) hours as needed for pain. 60 tablet 0  . pseudoephedrine (SUDAFED) 120 MG 12 hr tablet Take 120 mg by mouth every  12 (twelve) hours as needed for congestion.    . St Johns Wort 300 MG CAPS Take 3 capsules by mouth 2 (two) times daily.     . vitamin C (ASCORBIC ACID) 500 MG tablet Take 500 mg by mouth 2 (two) times daily.     No current facility-administered medications on file prior to visit.    Allergies  Allergen Reactions  . Bee Venom     REACTION: Hives and Swelling  . Bupropion Hcl     REACTION: Mood Changes  . Cephalexin     REACTION: Hives and Swelling  . Latex     Past Medical History  Diagnosis Date  . OA (osteoarthritis)   . PCOS (polycystic ovarian syndrome)   . Depression   . Obesity   . Vitamin D deficiency   . Family history of adverse reaction to anesthesia     FATHER WAS  SLOW TO WAKE UP  . Asthma   . Headache     OCCASIONAL MIGRAINES  . HNP (herniated nucleus pulposus), lumbar   . Sensitive skin   . Insomnia   . Hx of epistaxis     Past Surgical History  Procedure Laterality Date  . Wisdom tooth extraction    . Lumbar laminectomy/decompression microdiscectomy Right 06/02/2014    Procedure: MICRO LUMBAR DECOMPRESSION L4 - L5 ON THE RIGHT 1 LEVEL;  Surgeon: Johnn Hai, MD;  Location: [facility];  Service: Orthopedics;  Laterality: Right;    Family History  Problem Relation Age of Onset  . Heart attack Father 43    History   Social History  . Marital Status: Single    Spouse Name: N/A    Number of Children: N/A  . Years of Education: N/A   Occupational History  . Not on file.   Social History Main Topics  . Smoking status: Former Smoker    Quit date: 05/26/1990  . Smokeless tobacco: Never Used  . Alcohol Use: No  . Drug Use: No  . Sexual Activity: Not on file   Other Topics Concern  . Not on file   Social History Narrative   The PMH, PSH, Social History, Family History, Medications, and allergies have been reviewed in [facility], and have been updated if relevant.   Review of Systems  Constitutional: Positive for chills and fatigue. Negative for fever.  HENT: Positive for rhinorrhea, sinus pressure and sneezing. Negative for sore throat, trouble swallowing and voice change.   Eyes: Negative.   Respiratory: Positive for cough.   Cardiovascular: Negative.  Negative for chest pain and leg swelling.  Gastrointestinal: Negative.   Musculoskeletal: Positive for back pain.  Skin: Negative.   Neurological: Positive for dizziness. Negative for tremors, seizures, syncope, speech difficulty, weakness, light-headedness, numbness and headaches.  Hematological: Negative.   Psychiatric/Behavioral: Positive for sleep disturbance and dysphoric mood. Negative for suicidal ideas, hallucinations, behavioral problems, confusion, self-injury, decreased  concentration and agitation. The patient is nervous/anxious. The patient is not hyperactive.   All other systems reviewed and are negative.      Objective:    BP 144/94 mmHg  Pulse 90  Temp(Src) 98.2 F (36.8 C) (Oral)  Wt 343 lb 12 oz (155.924 kg)  SpO2 91%  LMP 06/14/2014   Physical Exam  Constitutional: She is oriented to person, place, and time. She appears well-developed and well-nourished. No distress.  HENT:  Head: Normocephalic.  Right Ear: Hearing and tympanic membrane normal.  Left Ear: Hearing and tympanic membrane normal.  Nose: Rhinorrhea  present. Right sinus exhibits no maxillary sinus tenderness and no frontal sinus tenderness. Left sinus exhibits no maxillary sinus tenderness and no frontal sinus tenderness.  Eyes: Conjunctivae are normal.  Neck: Normal range of motion.  Cardiovascular: Normal rate and regular rhythm.   Pulmonary/Chest: Effort normal. She has no decreased breath sounds. She has wheezes in the left middle field. She has rhonchi in the left middle field and the left lower field. She has no rales.  Musculoskeletal:  Well healed vertical surgical incision  Neurological: She is alert and oriented to person, place, and time. No cranial nerve deficit. Coordination normal.  Skin: Skin is warm and dry.  Psychiatric: She has a normal mood and affect. Her behavior is normal. Judgment and thought content normal.          Assessment & Plan:   Acute upper respiratory infection  Insomnia  Anxiety No Follow-up on file.

## 2014-06-29 ENCOUNTER — Encounter: Payer: Self-pay | Admitting: Specialist

## 2014-06-30 ENCOUNTER — Telehealth: Payer: Self-pay | Admitting: Family Medicine

## 2014-06-30 NOTE — Telephone Encounter (Signed)
[facility] Patient Name: Danielle Irwin DOB: 04-17-1971 Initial Comment Caller states was Dx w/ Bronchitis, cough is worse. Nurse Assessment Nurse: Markus Daft, RN, Sherre Poot Date/Time (Eastern Time): 06/30/2014 3:25:58 PM Confirm and document reason for call. If symptomatic, describe symptoms. ---Caller states that was diagnosed w/ Bronchitis 06/20/14, completed Z pack and improved, and then cough is back and worse now. Productive yellow phlegm. Cough started last weekend of December. Had back surgery 06/02/14. Has the patient traveled out of the country within the last 30 days? ---Not Applicable Does the patient require triage? ---Yes Related visit to physician within the last 2 weeks? ---Yes Does the PT have any chronic conditions? (i.e. diabetes, asthma, etc.) ---Yes List chronic conditions. ---Bronchitis every year in winter; Asthma - Albuterol inhaler; PCOS Did the patient indicate they were pregnant? ---No Guidelines Guideline Title Affirmed Question Affirmed Notes Asthma Attack [1] Wheezing or coughing AND [2] hasn't used neb or inhaler twice AND [3] it's available Asthma Attack Chest pain still w/ chest heaviness despite using inhalers back to back treatment 5-6/10 and all the time Final Disposition User Go to ED Now Markus Daft, RN, Windy Comments Still wheezing and coughing despite using the Albuterol inhaler. She states that she just spent almost $30,000 on back surgery and can not afford to go back to the ER. She will have to wait and try to be seen next wk. she states. She understands the advice. -- RN will send msg. to MD. thru [facility].

## 2014-07-04 NOTE — Telephone Encounter (Signed)
Please call to check on pt. 

## 2014-07-04 NOTE — Telephone Encounter (Signed)
Spoke to patient and was advised that she is out of her prescription cough medication, but has been taking Robitussin and Alka Seltzer cold which has help a little. Patient stated that she had a low grade fever yesterday, productive cough at times which is thick and yellow. Offered patient appointment today which she declined and requested an appointment tomorrow since the roads in her neighborhood are really bad. Appointment scheduled for Tuesday with Dr. [name]. Advised patient that if she gets any worse before appointment tomorrow she needs to go to the ER.

## 2014-07-05 ENCOUNTER — Ambulatory Visit (INDEPENDENT_AMBULATORY_CARE_PROVIDER_SITE_OTHER): Payer: BLUE CROSS/BLUE SHIELD | Admitting: Family Medicine

## 2014-07-05 ENCOUNTER — Encounter: Payer: Self-pay | Admitting: Family Medicine

## 2014-07-05 VITALS — BP 124/80 | HR 100 | Temp 97.9°F | Wt 350.8 lb

## 2014-07-05 DIAGNOSIS — J069 Acute upper respiratory infection, unspecified: Secondary | ICD-10-CM

## 2014-07-05 MED ORDER — HYDROCOD POLST-CHLORPHEN POLST 10-8 MG/5ML PO LQCR: 5.0000 mL | Freq: Every evening | ORAL | Status: DC | PRN

## 2014-07-05 MED ORDER — LEVOFLOXACIN 500 MG PO TABS: 500.0000 mg | ORAL_TABLET | Freq: Every day | ORAL | Status: DC

## 2014-07-05 NOTE — Progress Notes (Signed)
Pre visit review using our clinic review tool, if applicable. No additional management support is needed unless otherwise documented below in the visit note. 

## 2014-07-05 NOTE — Patient Instructions (Signed)
Good to see you. Please take levaquin as directed with as needed Tussionex. Keep [place] updated.

## 2014-07-05 NOTE — Progress Notes (Signed)
Subjective:   Patient ID: Danielle Irwin, female    DOB: [birth year], 44 y.o.   MRN: [id number]  JUDI JAFFE is a pleasant 44 y.o. year old female who presents to clinic today with Cough  on 07/05/2014  HPI: Here for follow up.  Saw her on 06/21/14 for cough she developed while in hospital for back surgery which had become productive and progressive. Given zpack with as needed Tussionex.  Symptoms improved- cough was not as productive but a few days ago, she felt like symptoms started to worsen again.  Increased cough.  Now coughing so hard, having post tussive emesis.  Still no fever. Ran out of tussionex which was effective.  +fatigue, sweats.  Current Outpatient Prescriptions on File Prior to Visit  Medication Sig Dispense Refill  . acetaminophen (TYLENOL) 650 MG CR tablet Take 1,300 mg by mouth every 8 (eight) hours as needed for pain.    Marland Kitchen albuterol (VENTOLIN HFA) 108 (90 BASE) MCG/ACT inhaler Inhale 2 puffs into the lungs every 6 (six) hours as needed. (Patient taking differently: Inhale 2 puffs into the lungs every 6 (six) hours as needed for wheezing. ) 1 Inhaler 1  . Alum & Mag Hydroxide-Simeth (ANTACID ANTI-GAS PO) Take 1 tablet by mouth daily as needed (for gas).    . B Complex-C (B-COMPLEX WITH VITAMIN C) tablet Take 1 tablet by mouth daily.    . calcium carbonate (TUMS - DOSED IN MG ELEMENTAL CALCIUM) 500 MG chewable tablet Chew 1 tablet by mouth daily.      . calcium-vitamin D (OSCAL WITH D) 500-200 MG-UNIT per tablet Take 1 tablet by mouth daily with breakfast.    . cetirizine-pseudoephedrine (ZYRTEC-D ALLERGY & CONGESTION) 5-120 MG per tablet Take 1 tablet by mouth daily.     . Cholecalciferol (VITAMIN D) 2000 UNITS CAPS Take 1 capsule by mouth daily.      Marland Kitchen DM-Doxylamine-Acetaminophen (NYQUIL COLD & FLU PO) Take 30 mLs by mouth at bedtime as needed (cold/flu).    Marland Kitchen docusate sodium (COLACE) 100 MG capsule Take 1 capsule (100 mg total) by mouth 2 (two) times daily as needed  for mild constipation. 20 capsule 1  . doxylamine, Sleep, (UNISOM) 25 MG tablet Take 25 mg by mouth at bedtime as needed for sleep.    Marland Kitchen ECHINACEA-GOLDEN SEAL PO Take 1 tablet by mouth 2 (two) times daily.    . magnesium 30 MG tablet Take 30 mg by mouth daily.    . Melatonin 10 MG TABS Take 1 tablet by mouth at bedtime as needed (for sleep).    . methocarbamol (ROBAXIN) 500 MG tablet Take 1 tablet (500 mg total) by mouth 3 (three) times daily between meals as needed for muscle spasms. 40 tablet 1  . Multiple Vitamin (MULTIVITAMIN) tablet Take 1 tablet by mouth daily.      . norethindrone-ethinyl estradiol-iron (MICROGESTIN FE,GILDESS FE,LOESTRIN FE) 1.5-30 MG-MCG tablet Take 1 tablet by mouth daily.    Marland Kitchen oxyCODONE-acetaminophen (PERCOCET) 7.5-325 MG per tablet Take 1 tablet by mouth every 4 (four) hours as needed for pain. 60 tablet 0  . pseudoephedrine (SUDAFED) 120 MG 12 hr tablet Take 120 mg by mouth every 12 (twelve) hours as needed for congestion.    . St Johns Wort 300 MG CAPS Take 3 capsules by mouth 2 (two) times daily.     . traZODone (DESYREL) 50 MG tablet Take 0.5-1 tablets (25-50 mg total) by mouth at bedtime as needed for sleep. 30 tablet 3  .  vitamin C (ASCORBIC ACID) 500 MG tablet Take 500 mg by mouth 2 (two) times daily.     No current facility-administered medications on file prior to visit.    Allergies  Allergen Reactions  . Bee Venom     REACTION: Hives and Swelling  . Bupropion Hcl     REACTION: Mood Changes  . Cephalexin     REACTION: Hives and Swelling  . Latex     Past Medical History  Diagnosis Date  . OA (osteoarthritis)   . PCOS (polycystic ovarian syndrome)   . Depression   . Obesity   . Vitamin D deficiency   . Family history of adverse reaction to anesthesia     FATHER WAS SLOW TO WAKE UP  . Asthma   . Headache     OCCASIONAL MIGRAINES  . HNP (herniated nucleus pulposus), lumbar   . Sensitive skin   . Insomnia   . Hx of epistaxis     Past  Surgical History  Procedure Laterality Date  . Wisdom tooth extraction    . Lumbar laminectomy/decompression microdiscectomy Right 06/02/2014    Procedure: MICRO LUMBAR DECOMPRESSION L4 - L5 ON THE RIGHT 1 LEVEL;  Surgeon: Johnn Hai, MD;  Location: [facility];  Service: Orthopedics;  Laterality: Right;    Family History  Problem Relation Age of Onset  . Heart attack Father 77    History   Social History  . Marital Status: Single    Spouse Name: N/A    Number of Children: N/A  . Years of Education: N/A   Occupational History  . Not on file.   Social History Main Topics  . Smoking status: Former Smoker    Quit date: 05/26/1990  . Smokeless tobacco: Never Used  . Alcohol Use: No  . Drug Use: No  . Sexual Activity: Not on file   Other Topics Concern  . Not on file   Social History Narrative   The PMH, PSH, Social History, Family History, Medications, and allergies have been reviewed in [facility], and have been updated if relevant.   Review of Systems  Constitutional: Positive for chills, diaphoresis and fatigue.  HENT: Negative.   Respiratory: Positive for cough and shortness of breath. Negative for wheezing.   Gastrointestinal: Positive for vomiting.  Neurological: Negative.   Hematological: Negative.   Psychiatric/Behavioral: Negative.   All other systems reviewed and are negative.      Objective:    BP 124/80 mmHg  Pulse 100  Temp(Src) 97.9 F (36.6 C) (Oral)  Wt 350 lb 12 oz (159.099 kg)  SpO2 97%  LMP 06/14/2014   Physical Exam  Constitutional: She is oriented to person, place, and time. She appears well-developed and well-nourished. No distress.  Having coughing fits in office  HENT:  Head: Normocephalic.  Eyes: Conjunctivae are normal.  Neck: Normal range of motion.  Cardiovascular: Normal rate.   Pulmonary/Chest: Effort normal and breath sounds normal. No respiratory distress. She has no wheezes. She has no rales. She exhibits no tenderness.    Musculoskeletal: She exhibits no edema.  Neurological: She is alert and oriented to person, place, and time.  Skin: Skin is warm and dry.  Psychiatric: She has a normal mood and affect. Her behavior is normal. Judgment and thought content normal.  Nursing note and vitals reviewed.         Assessment & Plan:   Acute upper respiratory infection No Follow-up on file.

## 2014-07-05 NOTE — Assessment & Plan Note (Signed)
Symptoms initially improved with zpack.  Lingering cough would be expected but now becoming more frequent and productive again--concerning for persistent bacterial infection with chills and sweats. Will refill tussionex- rx given to pt. 7 day course of levaquin. Call or return to clinic prn if these symptoms worsen or fail to improve as anticipated. The patient indicates understanding of these issues and agrees with the plan.

## 2014-07-11 ENCOUNTER — Encounter: Payer: Self-pay | Admitting: Specialist

## 2014-08-09 ENCOUNTER — Encounter
Admit: 2014-08-09 | Disposition: A | Discharge status: Home / Self Care | Payer: Self-pay | Attending: Specialist | Admitting: Specialist

## 2014-08-18 ENCOUNTER — Telehealth: Payer: Self-pay | Admitting: Family Medicine

## 2014-08-18 NOTE — Telephone Encounter (Signed)
I just spoke with Shirlean Mylar and informed her that the pt has only seen you for acute visits recently. I informed her that if this was in regards to her surgery, her surgeon may need to be the one to complete forms regarding work restrictions

## 2014-08-18 NOTE — Telephone Encounter (Signed)
Restrictions form in dr aron's box Please give back to robin when completed  Sent copy of medical release to [employer] They wanted last 2 years of records

## 2014-08-18 NOTE — Telephone Encounter (Signed)
What is this concerning?  I am not sure what this means.

## 2014-08-19 NOTE — Telephone Encounter (Signed)
Danielle Irwin what am i supposed to do with the paperwork  See below Danielle Irwin note

## 2014-08-19 NOTE — Telephone Encounter (Signed)
FYI: Spoke with pt. She states the forms were supposed to be faxed to [employer] office at [employer].  Faxed form to [phone]

## 2014-09-05 NOTE — Telephone Encounter (Signed)
Received Long Term Disability restriction form from [facility]. Faxed forms to Dr. Lawanda Cousins office also.

## 2014-09-09 ENCOUNTER — Encounter
Admit: 2014-09-09 | Disposition: A | Discharge status: Home / Self Care | Payer: Self-pay | Attending: Specialist | Admitting: Specialist

## 2014-10-19 ENCOUNTER — Encounter: Payer: Self-pay | Admitting: Family Medicine

## 2014-10-19 ENCOUNTER — Ambulatory Visit (INDEPENDENT_AMBULATORY_CARE_PROVIDER_SITE_OTHER): Payer: BLUE CROSS/BLUE SHIELD | Admitting: Family Medicine

## 2014-10-19 ENCOUNTER — Ambulatory Visit (HOSPITAL_COMMUNITY): Payer: BLUE CROSS/BLUE SHIELD | Attending: Family Medicine

## 2014-10-19 VITALS — BP 122/90 | HR 95 | Temp 98.2°F | Wt 350.0 lb

## 2014-10-19 DIAGNOSIS — M79604 Pain in right leg: Secondary | ICD-10-CM | POA: Diagnosis present

## 2014-10-19 DIAGNOSIS — R2241 Localized swelling, mass and lump, right lower limb: Secondary | ICD-10-CM | POA: Diagnosis not present

## 2014-10-19 NOTE — Progress Notes (Signed)
Subjective:   Patient ID: Danielle Irwin, female    DOB: 1971-01-26, 44 y.o.   MRN: [id number]  Danielle Irwin is a pleasant 44 y.o. year old female who presents to clinic today with Leg Pain  on 10/19/2014  HPI:  Right leg pain/swelling- Acute onset two days ago right lower leg mass- warm to touch, red and painful. No CP or SOB.  Legs have also seemed more swollen bilaterally.  She is taking ASA.  No recent surgeries- last surgery in 05/2014.  No recent travel.  She is a non smoker.  She is being prescribed OCP with estrogen by her GYN.  Current Outpatient Prescriptions on File Prior to Visit  Medication Sig Dispense Refill  . acetaminophen (TYLENOL) 650 MG CR tablet Take 1,300 mg by mouth every 8 (eight) hours as needed for pain.    Marland Kitchen albuterol (VENTOLIN HFA) 108 (90 BASE) MCG/ACT inhaler Inhale 2 puffs into the lungs every 6 (six) hours as needed. (Patient taking differently: Inhale 2 puffs into the lungs every 6 (six) hours as needed for wheezing. ) 1 Inhaler 1  . Alum & Mag Hydroxide-Simeth (ANTACID ANTI-GAS PO) Take 1 tablet by mouth daily as needed (for gas).    . B Complex-C (B-COMPLEX WITH VITAMIN C) tablet Take 1 tablet by mouth daily.    . calcium carbonate (TUMS - DOSED IN MG ELEMENTAL CALCIUM) 500 MG chewable tablet Chew 1 tablet by mouth daily.      . calcium-vitamin D (OSCAL WITH D) 500-200 MG-UNIT per tablet Take 1 tablet by mouth daily with breakfast.    . cetirizine-pseudoephedrine (ZYRTEC-D ALLERGY & CONGESTION) 5-120 MG per tablet Take 1 tablet by mouth daily.     . chlorpheniramine-HYDROcodone (TUSSIONEX PENNKINETIC ER) 10-8 MG/5ML LQCR Take 5 mLs by mouth at bedtime as needed. 115 mL 0  . Cholecalciferol (VITAMIN D) 2000 UNITS CAPS Take 1 capsule by mouth daily.      Marland Kitchen DM-Doxylamine-Acetaminophen (NYQUIL COLD & FLU PO) Take 30 mLs by mouth at bedtime as needed (cold/flu).    Marland Kitchen docusate sodium (COLACE) 100 MG capsule Take 1 capsule (100 mg total) by mouth 2 (two)  times daily as needed for mild constipation. 20 capsule 1  . doxylamine, Sleep, (UNISOM) 25 MG tablet Take 25 mg by mouth at bedtime as needed for sleep.    Marland Kitchen ECHINACEA-GOLDEN SEAL PO Take 1 tablet by mouth 2 (two) times daily.    . magnesium 30 MG tablet Take 30 mg by mouth daily.    . Melatonin 10 MG TABS Take 1 tablet by mouth at bedtime as needed (for sleep).    . methocarbamol (ROBAXIN) 500 MG tablet Take 1 tablet (500 mg total) by mouth 3 (three) times daily between meals as needed for muscle spasms. 40 tablet 1  . Multiple Vitamin (MULTIVITAMIN) tablet Take 1 tablet by mouth daily.      . norethindrone-ethinyl estradiol-iron (MICROGESTIN FE,GILDESS FE,LOESTRIN FE) 1.5-30 MG-MCG tablet Take 1 tablet by mouth daily.    Marland Kitchen oxyCODONE-acetaminophen (PERCOCET) 7.5-325 MG per tablet Take 1 tablet by mouth every 4 (four) hours as needed for pain. 60 tablet 0  . pseudoephedrine (SUDAFED) 120 MG 12 hr tablet Take 120 mg by mouth every 12 (twelve) hours as needed for congestion.    . St Johns Wort 300 MG CAPS Take 3 capsules by mouth 2 (two) times daily.     . traZODone (DESYREL) 50 MG tablet Take 0.5-1 tablets (25-50 mg total) by mouth  at bedtime as needed for sleep. 30 tablet 3  . vitamin C (ASCORBIC ACID) 500 MG tablet Take 500 mg by mouth 2 (two) times daily.     No current facility-administered medications on file prior to visit.    Allergies  Allergen Reactions  . Bee Venom     REACTION: Hives and Swelling  . Bupropion Hcl     REACTION: Mood Changes  . Cephalexin     REACTION: Hives and Swelling  . Latex     Past Medical History  Diagnosis Date  . OA (osteoarthritis)   . PCOS (polycystic ovarian syndrome)   . Depression   . Obesity   . Vitamin D deficiency   . Family history of adverse reaction to anesthesia     FATHER WAS SLOW TO WAKE UP  . Asthma   . Headache     OCCASIONAL MIGRAINES  . HNP (herniated nucleus pulposus), lumbar   . Sensitive skin   . Insomnia   . Hx of  epistaxis     Past Surgical History  Procedure Laterality Date  . Wisdom tooth extraction    . Lumbar laminectomy/decompression microdiscectomy Right 06/02/2014    Procedure: MICRO LUMBAR DECOMPRESSION L4 - L5 ON THE RIGHT 1 LEVEL;  Surgeon: Johnn Hai, MD;  Location: [facility];  Service: Orthopedics;  Laterality: Right;    Family History  Problem Relation Age of Onset  . Heart attack Father 36    History   Social History  . Marital Status: Single    Spouse Name: N/A  . Number of Children: N/A  . Years of Education: N/A   Occupational History  . Not on file.   Social History Main Topics  . Smoking status: Former Smoker    Quit date: 05/26/1990  . Smokeless tobacco: Never Used  . Alcohol Use: No  . Drug Use: No  . Sexual Activity: Not on file   Other Topics Concern  . Not on file   Social History Narrative   The PMH, PSH, Social History, Family History, Medications, and allergies have been reviewed in [facility], and have been updated if relevant.     Review of Systems  Constitutional: Negative.   Respiratory: Negative.   Cardiovascular: Positive for leg swelling. Negative for chest pain and palpitations.  Endocrine: Negative.   Genitourinary: Negative.   Musculoskeletal: Positive for myalgias.  Skin: Positive for color change.  Neurological: Negative.   Hematological: Negative.   All other systems reviewed and are negative.      Objective:    BP 122/90 mmHg  Pulse 95  Temp(Src) 98.2 F (36.8 C) (Oral)  Wt 350 lb (158.759 kg)  SpO2 96%  LMP 10/04/2014   Physical Exam  Constitutional: She is oriented to person, place, and time. She appears well-developed and well-nourished. No distress.  Morbidly obese  Eyes: Conjunctivae are normal.  Neck: Normal range of motion.  Cardiovascular: Normal rate.   Pulmonary/Chest: Effort normal and breath sounds normal.  Musculoskeletal:       Right lower leg: She exhibits tenderness, swelling and edema.  Palpable  mass RLL, approx 1 cm, TTP, slightly erythematous, not warm to touch today but pt reports this symptom recently.  Neurological: She is alert and oriented to person, place, and time. No cranial nerve deficit.  Psychiatric: She has a normal mood and affect. Her behavior is normal. Judgment and thought content normal.  Nursing note and vitals reviewed.         Assessment &  Plan:   Mass of right lower leg - Plan: Ultrasound doppler venous legs bilat No Follow-up on file.

## 2014-10-19 NOTE — Assessment & Plan Note (Signed)
New- concerning for DVT. Stat venous doppler to rule this out. Continue ASA. The patient indicates understanding of these issues and agrees with the plan.

## 2014-10-19 NOTE — Addendum Note (Signed)
Addended by: Lucille Passy on: 10/19/2014 02:21 PM   Modules accepted: Orders

## 2014-10-19 NOTE — Progress Notes (Signed)
Pre visit review using our clinic review tool, if applicable. No additional management support is needed unless otherwise documented below in the visit note. 

## 2014-10-19 NOTE — Addendum Note (Signed)
Addended by: Lucille Passy on: 10/19/2014 02:12 PM   Modules accepted: Orders

## 2014-10-19 NOTE — Patient Instructions (Signed)
Good to see you. Please stop by to see Rosaria Ferries or Vaughan Basta on your way out.

## 2014-10-26 ENCOUNTER — Encounter: Payer: Self-pay | Admitting: Family Medicine

## 2014-10-26 ENCOUNTER — Encounter: Payer: BLUE CROSS/BLUE SHIELD | Admitting: Family Medicine

## 2014-10-26 ENCOUNTER — Ambulatory Visit (INDEPENDENT_AMBULATORY_CARE_PROVIDER_SITE_OTHER): Payer: BLUE CROSS/BLUE SHIELD | Admitting: Family Medicine

## 2014-10-26 VITALS — BP 140/88 | HR 78 | Temp 98.0°F | Ht 66.5 in | Wt 344.0 lb

## 2014-10-26 DIAGNOSIS — R519 Headache, unspecified: Secondary | ICD-10-CM

## 2014-10-26 DIAGNOSIS — M791 Myalgia: Secondary | ICD-10-CM

## 2014-10-26 DIAGNOSIS — IMO0001 Reserved for inherently not codable concepts without codable children: Secondary | ICD-10-CM

## 2014-10-26 DIAGNOSIS — M609 Myositis, unspecified: Secondary | ICD-10-CM | POA: Diagnosis not present

## 2014-10-26 DIAGNOSIS — R51 Headache: Secondary | ICD-10-CM

## 2014-10-26 DIAGNOSIS — Z01419 Encounter for gynecological examination (general) (routine) without abnormal findings: Secondary | ICD-10-CM | POA: Insufficient documentation

## 2014-10-26 DIAGNOSIS — Z0000 Encounter for general adult medical examination without abnormal findings: Secondary | ICD-10-CM

## 2014-10-26 DIAGNOSIS — Z1211 Encounter for screening for malignant neoplasm of colon: Secondary | ICD-10-CM | POA: Insufficient documentation

## 2014-10-26 DIAGNOSIS — L659 Nonscarring hair loss, unspecified: Secondary | ICD-10-CM | POA: Diagnosis not present

## 2014-10-26 DIAGNOSIS — D229 Melanocytic nevi, unspecified: Secondary | ICD-10-CM

## 2014-10-26 DIAGNOSIS — G4700 Insomnia, unspecified: Secondary | ICD-10-CM

## 2014-10-26 DIAGNOSIS — R5383 Other fatigue: Secondary | ICD-10-CM | POA: Insufficient documentation

## 2014-10-26 DIAGNOSIS — Z1331 Encounter for screening for depression: Secondary | ICD-10-CM | POA: Insufficient documentation

## 2014-10-26 DIAGNOSIS — R6889 Other general symptoms and signs: Secondary | ICD-10-CM | POA: Insufficient documentation

## 2014-10-26 LAB — CBC WITH DIFFERENTIAL/PLATELET
Basophils Absolute: 0 10*3/uL (ref 0.0–0.1)
Basophils Relative: 0.2 % (ref 0.0–3.0)
EOS PCT: 0 % (ref 0.0–5.0)
Eosinophils Absolute: 0 10*3/uL (ref 0.0–0.7)
HEMATOCRIT: 39.2 % (ref 36.0–46.0)
Hemoglobin: 13.5 g/dL (ref 12.0–15.0)
Lymphocytes Relative: 19.1 % (ref 12.0–46.0)
Lymphs Abs: 2.5 10*3/uL (ref 0.7–4.0)
MCHC: 34.5 g/dL (ref 30.0–36.0)
MCV: 89 fl (ref 78.0–100.0)
MONOS PCT: 5.2 % (ref 3.0–12.0)
Monocytes Absolute: 0.7 10*3/uL (ref 0.1–1.0)
NEUTROS ABS: 9.9 10*3/uL — AB (ref 1.4–7.7)
Neutrophils Relative %: 75.5 % (ref 43.0–77.0)
PLATELETS: 271 10*3/uL (ref 150.0–400.0)
RBC: 4.41 Mil/uL (ref 3.87–5.11)
RDW: 13 % (ref 11.5–15.5)
WBC: 13.1 10*3/uL — ABNORMAL HIGH (ref 4.0–10.5)

## 2014-10-26 LAB — VITAMIN D 25 HYDROXY (VIT D DEFICIENCY, FRACTURES): VITD: 36.5 ng/mL (ref 30.00–100.00)

## 2014-10-26 LAB — COMPREHENSIVE METABOLIC PANEL
ALBUMIN: 4.2 g/dL (ref 3.5–5.2)
ALK PHOS: 38 U/L — AB (ref 39–117)
ALT: 39 U/L — ABNORMAL HIGH (ref 0–35)
AST: 24 U/L (ref 0–37)
BILIRUBIN TOTAL: 0.5 mg/dL (ref 0.2–1.2)
BUN: 12 mg/dL (ref 6–23)
CALCIUM: 9.6 mg/dL (ref 8.4–10.5)
CO2: 23 meq/L (ref 19–32)
Chloride: 104 mEq/L (ref 96–112)
Creatinine, Ser: 0.75 mg/dL (ref 0.40–1.20)
GFR: 89.13 mL/min (ref >60.00)
GLUCOSE: 109 mg/dL — AB (ref 70–99)
Potassium: 4.1 mEq/L (ref 3.5–5.1)
Sodium: 136 mEq/L (ref 135–145)
Total Protein: 7.3 g/dL (ref 6.0–8.3)

## 2014-10-26 LAB — LIPID PANEL
CHOL/HDL RATIO: 5
Cholesterol: 233 mg/dL — ABNORMAL HIGH (ref 0–200)
HDL: 50 mg/dL (ref >39.00)
LDL Cholesterol: 155 mg/dL — ABNORMAL HIGH (ref 0–99)
NONHDL: 183
Triglycerides: 139 mg/dL (ref 0.0–149.0)
VLDL: 27.8 mg/dL (ref 0.0–40.0)

## 2014-10-26 LAB — SEDIMENTATION RATE: Sed Rate: 40 mm/hr — ABNORMAL HIGH (ref 0–22)

## 2014-10-26 LAB — FERRITIN: FERRITIN: 58.1 ng/mL (ref 10.0–291.0)

## 2014-10-26 LAB — TSH: TSH: 1.46 u[IU]/mL (ref 0.35–4.50)

## 2014-10-26 LAB — HEMOGLOBIN A1C: Hgb A1c MFr Bld: 5.3 % (ref 4.6–6.5)

## 2014-10-26 LAB — VITAMIN B12: Vitamin B-12: 428 pg/mL (ref 211–911)

## 2014-10-26 LAB — RHEUMATOID FACTOR: Rhuematoid fact SerPl-aCnc: <10 IU/mL (ref <=14)

## 2014-10-26 LAB — T4, FREE: Free T4: 0.61 ng/dL (ref 0.60–1.60)

## 2014-10-26 MED ORDER — TRAZODONE HCL 50 MG PO TABS: 25.0000 mg | ORAL_TABLET | Freq: Every evening | ORAL | Status: DC | PRN

## 2014-10-26 NOTE — Assessment & Plan Note (Signed)
New-  Discussed with Katrinka.  It may take several office visits to truly address all of her concerns. Will start by casting a wide net with blood work, ruling out rheum issues, anemia, thyroid dysfunction, B12 deficiency, etc. Refer to pulm for sleep study- ?[facility] playing a roll Orders Placed This Encounter  Procedures  . TSH  . Vitamin B12  . Vitamin D, 25-hydroxy  . CBC with Differential/Platelet  . Sedimentation Rate  . ANA  . Cyclic Citrul Peptide Antibody, IGG  . Comprehensive metabolic panel  . Lipid panel  . Hemoglobin A1c  . Rheumatoid Factor  . T4, Free  . Ferritin  . Ambulatory referral to Dermatology  . Ambulatory referral to Pulmonology

## 2014-10-26 NOTE — Patient Instructions (Signed)
Trigeminal Neuralgia  Trigeminal neuralgia is a nerve disorder that causes sudden attacks of severe facial pain. It is caused by damage to the trigeminal nerve, a major nerve in the face. It is more common in women and in the elderly, although it can also happen in younger patients. Attacks last from a few seconds to several minutes and can occur from a couple of times per year to several times per day. Trigeminal neuralgia can be a very distressing and disabling condition. Surgery may be needed in very severe cases if medical treatment does not give relief.  HOME CARE INSTRUCTIONS    If your caregiver prescribed medication to help prevent attacks, take as directed.   To help prevent attacks:   Chew on the unaffected side of the mouth.   Avoid touching your face.   Avoid blasts of hot or cold air.   Men may wish to grow a beard to avoid having to shave.  SEEK IMMEDIATE MEDICAL CARE IF:   Pain is unbearable and your medicine does not help.   You develop new, unexplained symptoms (problems).   You have problems that may be related to a medication you are taking.  Document Released: 05/24/2000 Document Revised: 08/19/2011 Document Reviewed: 03/24/2009  [employer] Patient Information 2015 [employer]. This information is not intended to replace advice given to you by your health care provider. Make sure you discuss any questions you have with your health care provider.

## 2014-10-26 NOTE — Assessment & Plan Note (Signed)
Reviewed preventive care protocols, scheduled due services, and updated immunizations Discussed nutrition, exercise, diet, and healthy lifestyle.  

## 2014-10-26 NOTE — Progress Notes (Signed)
   Subjective:   Patient ID: Danielle Irwin, female    DOB: Sep 30, 1970, 44 y.o.   MRN: [id number]  Danielle Irwin is a pleasant 44 y.o. year old female who presents to clinic today with Annual Exam  on 10/26/2014  HPI:  Has GYN- [facility]- per pt, neg pap and mammogram in 04/2014.  Brings in a 3 page list of symptoms she has been having off and on for months.  Left sided intermittent facial pain- very sharp.  Lasts a few seconds  No visual changes or difficulty swallowing. Nothing really makes it better as it usually resolves quickly.  Abnormal moles- feels they are changing, especially the one on her ear.  Not sleeping well- taking trazodone as prescribed but still wakes up in the middle of the night.  Long list of other symptoms including- fatigue, brittle hair, hair loss, anxiety, knee pain, headaches, joint pain, muscle aches, headaches, tingling in her feet--see ROS for further complaints.  + FH or RA and lupus  Has not been exercising to lose weight due to knee pain  Wt Readings from Last 3 Encounters:  10/26/14 344 lb (156.037 kg)  10/19/14 350 lb (158.759 kg)  07/05/14 350 lb 12 oz (159.099 kg)     Review of Systems  Constitutional: Positive for fatigue. Negative for fever.  HENT: Positive for ear pain and facial swelling.   Eyes: Negative.   Respiratory: Positive for shortness of breath.   Cardiovascular: Positive for leg swelling.  Gastrointestinal: Positive for abdominal pain.  Endocrine: Positive for polydipsia. Negative for polyphagia.  Genitourinary: Negative.   Musculoskeletal: Positive for myalgias, back pain, joint swelling, arthralgias and neck pain.  Skin: Negative.   Neurological: Positive for weakness and headaches. Negative for seizures, syncope, speech difficulty, light-headedness and numbness.  Psychiatric/Behavioral: Positive for sleep disturbance. Negative for suicidal ideas, hallucinations, behavioral problems, confusion, dysphoric mood,  decreased concentration and agitation. The patient is nervous/anxious.   All other systems reviewed and are negative.      Objective:    BP 140/88 mmHg  Pulse 78  Temp(Src) 98 F (36.7 C) (Oral)  Ht 5' 6.5" (1.689 m)  Wt 344 lb (156.037 kg)  BMI 54.70 kg/m2  SpO2 95%  LMP 10/04/2014   Physical Exam  Constitutional: She is oriented to person, place, and time. She appears well-developed and well-nourished.  Morbidly obese  HENT:  Head: Normocephalic.  Eyes: Conjunctivae are normal.  Neck: Normal range of motion. Neck supple. No thyromegaly present.  Cardiovascular: Normal rate and regular rhythm.   Pulmonary/Chest: Effort normal.  Abdominal: Soft.  Musculoskeletal: Normal range of motion.  Lymphadenopathy:    She has no cervical adenopathy.  Neurological: She is alert and oriented to person, place, and time. No cranial nerve deficit.  Skin:  Multiple nevi  Psychiatric: She has a normal mood and affect. Her behavior is normal. Judgment and thought content normal.  Nursing note and vitals reviewed.         Assessment & Plan:   Well woman exam - Plan: Comprehensive metabolic panel, Lipid panel  Myalgia and myositis - Plan: Sedimentation Rate, ANA, Cyclic Citrul Peptide Antibody, IGG, Hemoglobin A1c, Rheumatoid Factor  Other fatigue - Plan: TSH, Vitamin B12, Vitamin D, 25-hydroxy, CBC with Differential/Platelet, T4, Free  Insomnia  Hair loss No Follow-up on file.

## 2014-10-26 NOTE — Progress Notes (Signed)
Pre visit review using our clinic review tool, if applicable. No additional management support is needed unless otherwise documented below in the visit note. 

## 2014-10-26 NOTE — Assessment & Plan Note (Signed)
Refer to derm for evaluation and management.

## 2014-10-26 NOTE — Assessment & Plan Note (Signed)
Intermittent- ? Trigeminal neuralgia.  Given handout discussing supportive care.  If it continues, consider starting Tegretol. The patient indicates understanding of these issues and agrees with the plan.

## 2014-10-27 LAB — CYCLIC CITRUL PEPTIDE ANTIBODY, IGG

## 2014-10-27 LAB — ANA: ANA: NEGATIVE

## 2014-10-28 ENCOUNTER — Telehealth: Payer: Self-pay | Admitting: Family Medicine

## 2014-10-28 DIAGNOSIS — R5383 Other fatigue: Secondary | ICD-10-CM

## 2014-10-28 NOTE — Telephone Encounter (Signed)
Pt called and would like referral to an endocrinologist ([place] please)  She would like first available.  Best number to call pt is [phone] / lt

## 2014-10-28 NOTE — Telephone Encounter (Signed)
Spoke to pt who states that she is still fatigued and feels as though, "nothing was addressed." She states "although the lab work was ok, this doesn't fix the problem."

## 2014-10-28 NOTE — Telephone Encounter (Signed)
What does she need referral for?

## 2014-10-31 ENCOUNTER — Encounter: Payer: Self-pay | Admitting: Pulmonary Disease

## 2014-10-31 ENCOUNTER — Ambulatory Visit (INDEPENDENT_AMBULATORY_CARE_PROVIDER_SITE_OTHER): Payer: BLUE CROSS/BLUE SHIELD | Admitting: Pulmonary Disease

## 2014-10-31 VITALS — BP 120/82 | HR 106 | Ht 66.5 in | Wt 342.4 lb

## 2014-10-31 DIAGNOSIS — G4700 Insomnia, unspecified: Secondary | ICD-10-CM

## 2014-10-31 DIAGNOSIS — G4733 Obstructive sleep apnea (adult) (pediatric): Secondary | ICD-10-CM | POA: Diagnosis not present

## 2014-10-31 NOTE — Progress Notes (Signed)
Subjective:    Patient ID: Danielle Irwin, female    DOB: [birth year], 44 y.o.   MRN: [id number]  HPI  Chief Complaint  Patient presents with  . Sleep Consult    Referred by Dr. [name].  has trouble falling asleep and staying asleep.  Deviated septum causes trouble breathing out of nose.  Currently taking Trazodone to help sleep, still not working.  Has taken Melatonin to help as well, nothing helps.  Has been out of work for several months due to back surgery, but still cannot get any sleep and never feels rested.  Had sleep study done 10 years ago in [place] but doesn't remember where or exact year. Epworth Score: 55    44 year old obese, wireless sales associate for [employer], presents for evaluation of sleep-disordered breathing and insomnia. Epworth sleepiness score is 12 She reports trouble falling asleep and staying asleep for many years. She has tried melatonin for this with minimal effect. She was just placed on 50 mA of trazodone She underwent polysomnogram in [place] and 2006-which was reportedly normal Bedtime is 8-10PM, sleep latency can be up to 2 hours, she sleeps on her side with 2 pillows, reports multiple nocturnal awakenings including nocturia-awakenings are due to arthritic pain, nocturia and nasal symptoms, she is out of bed as late as 10 AM feeling tired with occasional headache and dryness of mouth. She's gained 50 pounds in the last 5 years She underwent back surgery in 05/2014  There is no history suggestive of cataplexy, sleep paralysis or parasomnias     Past Medical History  Diagnosis Date  . OA (osteoarthritis)   . PCOS (polycystic ovarian syndrome)   . Depression   . Obesity   . Vitamin D deficiency   . Family history of adverse reaction to anesthesia     FATHER WAS SLOW TO WAKE UP  . Asthma   . Headache     OCCASIONAL MIGRAINES  . HNP (herniated nucleus pulposus), lumbar   . Sensitive skin   . Insomnia   . Hx of epistaxis     Past Surgical  History  Procedure Laterality Date  . Wisdom tooth extraction    . Lumbar laminectomy/decompression microdiscectomy Right 06/02/2014    Procedure: MICRO LUMBAR DECOMPRESSION L4 - L5 ON THE RIGHT 1 LEVEL;  Surgeon: Johnn Hai, MD;  Location: [facility];  Service: Orthopedics;  Laterality: Right;    Allergies  Allergen Reactions  . Bee Venom     REACTION: Hives and Swelling  . Bupropion Hcl     REACTION: Mood Changes  . Cephalexin     REACTION: Hives and Swelling  . Latex     History   Social History  . Marital Status: Single    Spouse Name: N/A  . Number of Children: N/A  . Years of Education: N/A   Occupational History  . Not on file.   Social History Main Topics  . Smoking status: Former Smoker    Quit date: 05/26/1990  . Smokeless tobacco: Never Used  . Alcohol Use: No  . Drug Use: No  . Sexual Activity: Not on file   Other Topics Concern  . Not on file   Social History Narrative    Family History  Problem Relation Age of Onset  . Heart attack Father 57      Review of Systems  Constitutional: Negative for fever, chills and unexpected weight change.  HENT: Negative for congestion, dental problem, ear pain, nosebleeds, postnasal drip, rhinorrhea,  sinus pressure, sneezing, sore throat, trouble swallowing and voice change.   Eyes: Negative for visual disturbance.  Respiratory: Negative for cough, choking and shortness of breath.   Cardiovascular: Negative for chest pain and leg swelling.  Gastrointestinal: Negative for vomiting, abdominal pain and diarrhea.  Genitourinary: Negative for difficulty urinating.  Musculoskeletal: Negative for arthralgias.  Skin: Negative for rash.  Neurological: Negative for tremors, syncope and headaches.  Hematological: Does not bruise/bleed easily.       Objective:   Physical Exam  Gen. Pleasant, obese, in no distress, normal affect ENT - no lesions, no post nasal drip, class 2-3 airway Neck: No JVD, no thyromegaly,  no carotid bruits Lungs: no use of accessory muscles, no dullness to percussion, decreased without rales or rhonchi  Cardiovascular: Rhythm regular, heart sounds  normal, no murmurs or gallops, no peripheral edema Abdomen: soft and non-tender, no hepatosplenomegaly, BS normal. Musculoskeletal: No deformities, no cyanosis or clubbing Neuro:  alert, non focal, no tremors       Assessment & Plan:

## 2014-10-31 NOTE — Patient Instructions (Signed)
Increase Trazodone to 100 mg - 2 tabs - at bedtime If this does not work, increase to 150 mg - call back in 1 week to report Sleep study

## 2014-10-31 NOTE — Telephone Encounter (Signed)
Referral placed but I am not sure they will see her for this.  We can certainly try.

## 2014-10-31 NOTE — Assessment & Plan Note (Signed)
Increase Trazodone to 100 mg - 2 tabs - at bedtime If this does not work, increase to 150 mg - call back in 1 week to report Light exposure in am Ct melatonin

## 2014-10-31 NOTE — Assessment & Plan Note (Signed)
Given excessive daytime somnolence, narrow pharyngeal exam, witnessed apneas & loud snoring, obstructive sleep apnea is very likely & an overnight polysomnogram will be scheduled as a split study. The pathophysiology of obstructive sleep apnea , it's cardiovascular consequences & modes of treatment including CPAP were discused with the patient in detail & they evidenced understanding.  

## 2014-11-11 ENCOUNTER — Ambulatory Visit (INDEPENDENT_AMBULATORY_CARE_PROVIDER_SITE_OTHER): Payer: BLUE CROSS/BLUE SHIELD | Admitting: Family Medicine

## 2014-11-11 ENCOUNTER — Ambulatory Visit: Payer: BLUE CROSS/BLUE SHIELD | Admitting: Family Medicine

## 2014-11-11 ENCOUNTER — Encounter: Payer: Self-pay | Admitting: Family Medicine

## 2014-11-11 VITALS — BP 139/70 | HR 78 | Temp 98.3°F | Ht 66.5 in | Wt 351.2 lb

## 2014-11-11 DIAGNOSIS — M25562 Pain in left knee: Secondary | ICD-10-CM

## 2014-11-11 DIAGNOSIS — R011 Cardiac murmur, unspecified: Secondary | ICD-10-CM

## 2014-11-11 DIAGNOSIS — R609 Edema, unspecified: Secondary | ICD-10-CM | POA: Diagnosis not present

## 2014-11-11 DIAGNOSIS — M25561 Pain in right knee: Secondary | ICD-10-CM | POA: Diagnosis not present

## 2014-11-11 MED ORDER — TRAMADOL HCL 50 MG PO TABS: 50.0000 mg | ORAL_TABLET | Freq: Two times a day (BID) | ORAL | Status: DC | PRN

## 2014-11-11 MED ORDER — HYDROCHLOROTHIAZIDE 25 MG PO TABS: 25.0000 mg | ORAL_TABLET | Freq: Every day | ORAL | Status: DC

## 2014-11-11 NOTE — Progress Notes (Signed)
Pre visit review using our clinic review tool, if applicable. No additional management support is needed unless otherwise documented below in the visit note. 

## 2014-11-11 NOTE — Assessment & Plan Note (Signed)
Acute worsening or chronic issue.  Likely multifactorial in etiology with obesity and venous insufficiency being central. ? Worsened with recent use of ibuprofen.  Recent labs nml.. No kidney or thyroid issue.  Will eval new murmur with ECHO.   Recommended weight loss, exercise, elevation of feet, compression hose if able to use.  Start HCTZ daily for diuresis.

## 2014-11-11 NOTE — Progress Notes (Signed)
Subjective:    Patient ID: Danielle Irwin, female    DOB: 1970-11-13, 44 y.o.   MRN: [id number]  HPI  44 year old female pt of Dr. Hulen Shouts with history of morbid obesity, PCOS, spinal stenosis presents with new onset peripheral edema in last few years. Significantly worse in last 2 weeks.  She is using OTC caffeine diuretic but this does  No t help much . Same in both legs, worse at end of the day. PAin in feet when trying to walk.  She has also had bilateral knee pain in last year.  Followed by Dr. Tonita Cong. Has OA bilateral. Had steroid injections in bilateral knee last 2week.. Improved until swelling worsened. Improved some when wakes in morning but still present.  No recent exposures , only new med is  Ibuprofen as avoiding tylenol given elevated LFTs.  ALT 39 likely due to recetn pain med use.  Recent lab 10/26/2014 showed nml thyroid, cbc.   Wt Readings from Last 3 Encounters:  11/11/14 351 lb 4 oz (159.326 kg)  10/31/14 342 lb 6.4 oz (155.312 kg)  10/26/14 344 lb (156.037 kg)   BP Readings from Last 3 Encounters:  11/11/14 139/70  10/31/14 120/82  10/26/14 140/88   She does not exercise give back surgery in 05/2015.      Review of Systems  Constitutional: Positive for fatigue. Negative for fever.       Cold intolerance  HENT: Negative for ear pain.   Eyes: Negative for pain.  Respiratory: Negative for shortness of breath.   Cardiovascular: Negative for chest pain, palpitations and leg swelling.  Gastrointestinal: Negative for abdominal pain.  Genitourinary: Negative for dysuria.       Objective:   Physical Exam  Constitutional: Vital signs are normal. She appears well-developed and well-nourished. She is cooperative.  Non-toxic appearance. She does not appear ill. No distress.  Morbidly obese  HENT:  Head: Normocephalic.  Right Ear: Hearing, tympanic membrane, external ear and ear canal normal. Tympanic membrane is not erythematous, not retracted and not bulging.   Left Ear: Hearing, tympanic membrane, external ear and ear canal normal. Tympanic membrane is not erythematous, not retracted and not bulging.  Nose: No mucosal edema or rhinorrhea. Right sinus exhibits no maxillary sinus tenderness and no frontal sinus tenderness. Left sinus exhibits no maxillary sinus tenderness and no frontal sinus tenderness.  Mouth/Throat: Uvula is midline, oropharynx is clear and moist and mucous membranes are normal.  Eyes: Conjunctivae, EOM and lids are normal. Pupils are equal, round, and reactive to light. Lids are everted and swept, no foreign bodies found.  Neck: Trachea normal and normal range of motion. Neck supple. Carotid bruit is not present. No thyroid mass and no thyromegaly present.  Cardiovascular: Normal rate, regular rhythm, S1 normal, S2 normal, intact distal pulses and normal pulses.  Exam reveals no gallop and no friction rub.   Murmur heard.  Systolic murmur is present with a grade of 2/6  New murmur per records  bilateral 1 plus swelling,  extensive varicose veins  Pulmonary/Chest: Effort normal and breath sounds normal. No tachypnea. No respiratory distress. She has no decreased breath sounds. She has no wheezes. She has no rhonchi. She has no rales.  Abdominal: Soft. Normal appearance and bowel sounds are normal. There is no tenderness.  Neurological: She is alert.  Skin: Skin is warm, dry and intact. No rash noted.  Psychiatric: Her speech is normal and behavior is normal. Judgment and thought content normal. Her  mood appears not anxious. Cognition and memory are normal. She does not exhibit a depressed mood.          Assessment & Plan:

## 2014-11-11 NOTE — Patient Instructions (Signed)
Stop ibuprofen as could be contributing to swelling. For pain in knees can use tramadol as needed.  If not improving follow up with ORTHO.  Start hydrochlorothiazide.  Work on weight loss and exercise.  Elevate feet above heart.  Stop at front desk to set up referral for ECHO. Call for compression hose if interested.  Follow up if not improving as expected.

## 2014-11-11 NOTE — Assessment & Plan Note (Signed)
New finding.  ? Related to recent increase in peripheral edema. BP well controlled. No chest pain, SOB. Send for ECHO of heart to rule out LVH, valvular issue.

## 2014-11-11 NOTE — Assessment & Plan Note (Signed)
Likely worsened pain due to increase in swelling in legs.  Treat with tramadol to avoid NSAIDs as well as tylenol ( given swelling and LFTs respectively). If not improving follow up with ORTHO.

## 2014-11-21 ENCOUNTER — Other Ambulatory Visit: Payer: Self-pay

## 2014-11-21 ENCOUNTER — Ambulatory Visit (HOSPITAL_COMMUNITY): Payer: BLUE CROSS/BLUE SHIELD | Attending: Family Medicine

## 2014-11-21 DIAGNOSIS — R609 Edema, unspecified: Secondary | ICD-10-CM | POA: Diagnosis not present

## 2014-11-21 DIAGNOSIS — R011 Cardiac murmur, unspecified: Secondary | ICD-10-CM | POA: Diagnosis not present

## 2014-11-21 DIAGNOSIS — I517 Cardiomegaly: Secondary | ICD-10-CM | POA: Diagnosis not present

## 2014-11-21 DIAGNOSIS — I350 Nonrheumatic aortic (valve) stenosis: Secondary | ICD-10-CM | POA: Diagnosis not present

## 2014-11-21 DIAGNOSIS — I358 Other nonrheumatic aortic valve disorders: Secondary | ICD-10-CM | POA: Diagnosis not present

## 2014-11-23 ENCOUNTER — Ambulatory Visit (INDEPENDENT_AMBULATORY_CARE_PROVIDER_SITE_OTHER): Payer: BLUE CROSS/BLUE SHIELD | Admitting: Primary Care

## 2014-11-23 ENCOUNTER — Encounter: Payer: Self-pay | Admitting: Primary Care

## 2014-11-23 VITALS — BP 132/78 | HR 84 | Temp 98.0°F | Ht 66.5 in | Wt 351.4 lb

## 2014-11-23 DIAGNOSIS — L559 Sunburn, unspecified: Secondary | ICD-10-CM

## 2014-11-23 MED ORDER — SILVER SULFADIAZINE 1 % EX CREA: 1.0000 "application " | TOPICAL_CREAM | Freq: Two times a day (BID) | CUTANEOUS | Status: DC

## 2014-11-23 MED ORDER — IBUPROFEN 800 MG PO TABS: 800.0000 mg | ORAL_TABLET | Freq: Three times a day (TID) | ORAL | Status: DC | PRN

## 2014-11-23 NOTE — Patient Instructions (Signed)
Apply the Silvadene cream twice daily to burn. Ensure the area is coated completely. You make take ibuprofen 800 mg as needed for pain. Take 1 tablet by mouth three times daily as needed.  Sunburn Sunburn is damage to the skin caused by overexposure to ultraviolet (UV) rays. People with light skin or a fair complexion may be more susceptible to sunburn. Repeated sun exposure causes early skin aging such as wrinkles and sun spots. It also increases the risk of skin cancer. CAUSES A sunburn is caused by getting too much UV radiation from the sun. SYMPTOMS  Red or pink skin.  Soreness and swelling.  Pain.  Blisters.  Peeling skin.  Headache, fever, and fatigue if sunburn covers a large area. TREATMENT  Your caregiver may tell you to take certain medicines to lessen inflammation.  Your caregiver may have you use hydrocortisone cream or spray to help with itching and inflammation.  Your caregiver may prescribe an antibiotic cream to use on blisters. HOME CARE INSTRUCTIONS   Avoid further exposure to the sun.  Cool baths and cool compresses may be helpful if used several times per day. Do not apply ice, since this may result in more damage to the skin.  Only take over-the-counter or prescription medicines for pain, discomfort, or fever as directed by your caregiver.  Use aloe or other over-the-counter sunburn creams or gels on your skin. Do not apply these creams or gels on blisters.  Drink enough fluids to keep your urine clear or pale yellow.  Do not break blisters. If blisters break, your caregiver may recommend an antibiotic cream to apply to the affected area. PREVENTION   Try to avoid the sun between 10:00 a.m. and 4:00 p.m. when it is the strongest.  Apply sunscreen at least 30 minutes before exposure to the sun.  Always wear protective hats, clothing, and sunglasses with UV protection.  Avoid medicines, herbs, and foods that increase your sensitivity to  sunlight.  Avoid tanning beds. SEEK IMMEDIATE MEDICAL CARE IF:   You have a fever.  Your pain is uncontrolled with medicine.  You start to vomit or have diarrhea.  You feel faint or develop a headache with confusion.  You develop severe blistering.  You have a pus-like (purulent) discharge coming from the blisters.  Your burn becomes more painful and swollen. MAKE SURE YOU:  Understand these instructions.  Will watch your condition.  Will get help right away if you are not doing well or get worse. Document Released: 03/06/2005 Document Revised: 09/21/2012 Document Reviewed: 11/18/2010 [facility] Patient Information 2015 [place], [place]. This information is not intended to replace advice given to you by your health care provider. Make sure you discuss any questions you have with your health care provider.

## 2014-11-23 NOTE — Progress Notes (Signed)
Subjective:    Patient ID: Danielle Irwin, female    DOB: [birth year], 44 y.o.   MRN: [id number]  HPI  Danielle Irwin is a 44 year old female who presents today with a chief complaint of sunburn. The sunburn occurred last Thursday after falling asleep on the deck reading. While awake and reading her book she was in the shade, but when she woke up she was in the direct sunlight. The burn is mostly located to her anterior chest and breasts. She used solarcain, aloe vera gel, and coconut oil without relief. She reports oozing and opening of blisters to anterior chest. She tried applying neosporin to her chest yesterday and got an allergic reaction, so she's not been applying anything since.  Review of Systems  Respiratory: Negative for shortness of breath.   Cardiovascular: Negative for chest pain.  Skin:       Sunburn       Past Medical History  Diagnosis Date  . OA (osteoarthritis)   . PCOS (polycystic ovarian syndrome)   . Depression   . Obesity   . Vitamin D deficiency   . Family history of adverse reaction to anesthesia     FATHER WAS SLOW TO WAKE UP  . Asthma   . Headache     OCCASIONAL MIGRAINES  . HNP (herniated nucleus pulposus), lumbar   . Sensitive skin   . Insomnia   . Hx of epistaxis     History   Social History  . Marital Status: Single    Spouse Name: N/A  . Number of Children: N/A  . Years of Education: N/A   Occupational History  . Not on file.   Social History Main Topics  . Smoking status: Former Smoker    Quit date: 05/26/1990  . Smokeless tobacco: Never Used  . Alcohol Use: No  . Drug Use: No  . Sexual Activity: Not on file   Other Topics Concern  . Not on file   Social History Narrative    Past Surgical History  Procedure Laterality Date  . Wisdom tooth extraction    . Lumbar laminectomy/decompression microdiscectomy Right 06/02/2014    Procedure: MICRO LUMBAR DECOMPRESSION L4 - L5 ON THE RIGHT 1 LEVEL;  Surgeon: Johnn Hai, MD;   Location: [facility];  Service: Orthopedics;  Laterality: Right;    Family History  Problem Relation Age of Onset  . Heart attack Father 48    Allergies  Allergen Reactions  . Bee Venom     REACTION: Hives and Swelling  . Bupropion Hcl     REACTION: Mood Changes  . Cephalexin     REACTION: Hives and Swelling  . Latex     Current Outpatient Prescriptions on File Prior to Visit  Medication Sig Dispense Refill  . acetaminophen (TYLENOL) 650 MG CR tablet Take 1,300 mg by mouth every 8 (eight) hours as needed for pain.    Marland Kitchen albuterol (VENTOLIN HFA) 108 (90 BASE) MCG/ACT inhaler Inhale 2 puffs into the lungs every 6 (six) hours as needed. (Patient taking differently: Inhale 2 puffs into the lungs every 6 (six) hours as needed for wheezing. ) 1 Inhaler 1  . Alum & Mag Hydroxide-Simeth (ANTACID ANTI-GAS PO) Take 1 tablet by mouth daily as needed (for gas).    Marland Kitchen aspirin 81 MG tablet Take 81 mg by mouth daily.    . calcium carbonate (TUMS - DOSED IN MG ELEMENTAL CALCIUM) 500 MG chewable tablet Chew 1 tablet by mouth daily.      Marland Kitchen  calcium-vitamin D (OSCAL WITH D) 500-200 MG-UNIT per tablet Take 1 tablet by mouth daily with breakfast.    . cetirizine-pseudoephedrine (ZYRTEC-D ALLERGY & CONGESTION) 5-120 MG per tablet Take 1 tablet by mouth daily.     . chlorpheniramine-HYDROcodone (TUSSIONEX PENNKINETIC ER) 10-8 MG/5ML LQCR Take 5 mLs by mouth at bedtime as needed. 115 mL 0  . Cholecalciferol (VITAMIN D) 2000 UNITS CAPS Take 1 capsule by mouth daily.      Marland Kitchen docusate sodium (COLACE) 100 MG capsule Take 1 capsule (100 mg total) by mouth 2 (two) times daily as needed for mild constipation. 20 capsule 1  . ECHINACEA-GOLDEN SEAL PO Take 1 tablet by mouth 2 (two) times daily.    . hydrochlorothiazide (HYDRODIURIL) 25 MG tablet Take 1 tablet (25 mg total) by mouth daily. 30 tablet 11  . magnesium 30 MG tablet Take 30 mg by mouth daily.    . Melatonin 10 MG TABS Take 1 tablet by mouth at bedtime as needed  (for sleep).    . methocarbamol (ROBAXIN) 500 MG tablet Take 1 tablet (500 mg total) by mouth 3 (three) times daily between meals as needed for muscle spasms. 40 tablet 1  . Multiple Vitamin (MULTIVITAMIN) tablet Take 1 tablet by mouth daily.      . norethindrone-ethinyl estradiol-iron (MICROGESTIN FE,GILDESS FE,LOESTRIN FE) 1.5-30 MG-MCG tablet Take 1 tablet by mouth daily.    Marland Kitchen oxyCODONE-acetaminophen (PERCOCET) 7.5-325 MG per tablet Take 1 tablet by mouth every 4 (four) hours as needed for pain. 60 tablet 0  . pseudoephedrine (SUDAFED) 120 MG 12 hr tablet Take 120 mg by mouth every 12 (twelve) hours as needed for congestion.    . St Johns Wort 300 MG CAPS Take 3 capsules by mouth 2 (two) times daily.     . traMADol (ULTRAM) 50 MG tablet Take 1 tablet (50 mg total) by mouth every 12 (twelve) hours as needed for moderate pain. 30 tablet 0  . traZODone (DESYREL) 50 MG tablet Take 0.5-1 tablets (25-50 mg total) by mouth at bedtime as needed for sleep. 30 tablet 3  . vitamin C (ASCORBIC ACID) 500 MG tablet Take 500 mg by mouth 2 (two) times daily.    . Zinc Sulfate (ZINC 15 PO) Take by mouth.     No current facility-administered medications on file prior to visit.    BP 132/78 mmHg  Pulse 84  Temp(Src) 98 F (36.7 C) (Oral)  Ht 5' 6.5" (1.689 m)  Wt 351 lb 6.4 oz (159.394 kg)  BMI 55.87 kg/m2  SpO2 98%  LMP 11/04/2014    Objective:   Physical Exam  Cardiovascular: Normal rate and regular rhythm.   Pulmonary/Chest: Effort normal and breath sounds normal.  Skin: Skin is warm.  Widespread moderate/severe sunburn present to anterior chest and breasts. Redness and yellow crusting present to center of burn.          Assessment & Plan:  Sunburn:  Moderate to severe with erythem and cracked yellowing to center of burn. RX for Silvadene cream topically BID and Ibuprofen 800 mg TID PRN. Follow up if no improvement in the next week. Rehydrate with water, education provided regarding  sunscreen application.

## 2014-11-23 NOTE — Progress Notes (Signed)
Pre visit review using our clinic review tool, if applicable. No additional management support is needed unless otherwise documented below in the visit note. 

## 2014-12-07 ENCOUNTER — Telehealth: Payer: Self-pay | Admitting: Pulmonary Disease

## 2014-12-07 MED ORDER — TRAZODONE HCL 150 MG PO TABS: 150.0000 mg | ORAL_TABLET | Freq: Every day | ORAL | Status: DC

## 2014-12-07 NOTE — Telephone Encounter (Signed)
Ok to send 150 mg qhs #30 x 3 refills

## 2014-12-07 NOTE — Telephone Encounter (Signed)
Spoke with the pt and notified of recs  She verbalized understanding  Rx was sent to pharm  Nothing further needed

## 2014-12-07 NOTE — Telephone Encounter (Signed)
Per 10/31/14 OV: Patient Instructions       Increase Trazodone to 100 mg - 2 tabs - at bedtime If this does not work, increase to 150 mg - call back in 1 week to report Sleep study  --   Called spoke with pt. She has been taking trazodone 150 mg and has been working well for her. Wants RX sent in. Please advise RA thanks

## 2014-12-28 ENCOUNTER — Ambulatory Visit (INDEPENDENT_AMBULATORY_CARE_PROVIDER_SITE_OTHER): Payer: BLUE CROSS/BLUE SHIELD | Admitting: Family Medicine

## 2014-12-28 ENCOUNTER — Encounter: Payer: Self-pay | Admitting: Family Medicine

## 2014-12-28 VITALS — BP 134/72 | HR 70 | Temp 98.0°F | Ht 66.5 in | Wt 346.1 lb

## 2014-12-28 DIAGNOSIS — L089 Local infection of the skin and subcutaneous tissue, unspecified: Secondary | ICD-10-CM | POA: Diagnosis not present

## 2014-12-28 MED ORDER — MUPIROCIN 2 % EX OINT: 1.0000 "application " | TOPICAL_OINTMENT | Freq: Two times a day (BID) | CUTANEOUS | Status: DC

## 2014-12-28 MED ORDER — DOXYCYCLINE HYCLATE 100 MG PO TABS: 100.0000 mg | ORAL_TABLET | Freq: Two times a day (BID) | ORAL | Status: DC

## 2014-12-28 NOTE — Patient Instructions (Signed)
I think you have some skin lesions that are infected  Wash with soap and water Try the bactroban ointment twice daily to affected areas  Take doxycycline twice daily with non dairy food for 10 days If the areas in question get worse (red/pain/swelling)- please let me know    Update if not starting to improve in a week or if worsening

## 2014-12-28 NOTE — Progress Notes (Signed)
Subjective:    Patient ID: PRISMA DECARLO, female    DOB: 1970/08/01, 44 y.o.   MRN: [id number]  HPI About 1 1/2 weeks ago developed a painful knot in front of her R ear - assumed it was a bite (woke up with it)- unsure however  Still swollen - pain radiates down jaw line   They hurt- do not itch  Her nl temp is 96.4 - it has been a degree higher than usual   No n/v/rash   Another area (smaller) under L ear   Not traveling  No work in the woods  Has not found any ticks   Of note -was tested for MRSA in dec for back surgery -it was neg   She works at [employer] is exposed to [employer] (is allergic to neosporin)  Patient Active Problem List   Diagnosis Date Noted  . Skin infection 12/28/2014  . Bilateral knee pain 11/11/2014  . Peripheral edema 11/11/2014  . Systolic murmur 88/28/0034  . OSA (obstructive sleep apnea) 10/31/2014  . Well woman exam 10/26/2014  . Myalgia and myositis 10/26/2014  . Fatigue 10/26/2014  . Hair loss 10/26/2014  . Nevus 10/26/2014  . Left facial pain 10/26/2014  . Morbid obesity 10/26/2014  . Somatic complaints, multiple 10/26/2014  . Insomnia 06/21/2014  . Anxiety 06/21/2014  . Spinal stenosis at L4-L5 level 06/02/2014  . Lumbar radicular pain 02/13/2012  . POLYCYSTIC OVARIAN DISEASE 06/19/2010   Past Medical History  Diagnosis Date  . OA (osteoarthritis)   . PCOS (polycystic ovarian syndrome)   . Depression   . Obesity   . Vitamin D deficiency   . Family history of adverse reaction to anesthesia     FATHER WAS SLOW TO WAKE UP  . Asthma   . Headache     OCCASIONAL MIGRAINES  . HNP (herniated nucleus pulposus), lumbar   . Sensitive skin   . Insomnia   . Hx of epistaxis    Past Surgical History  Procedure Laterality Date  . Wisdom tooth extraction    . Lumbar laminectomy/decompression microdiscectomy Right 06/02/2014    Procedure: MICRO LUMBAR DECOMPRESSION L4 - L5 ON THE RIGHT 1 LEVEL;  Surgeon: Johnn Hai, MD;  Location: [facility];  Service: Orthopedics;  Laterality: Right;   History  Substance Use Topics  . Smoking status: Former Smoker    Quit date: 05/26/1990  . Smokeless tobacco: Never Used  . Alcohol Use: No   Family History  Problem Relation Age of Onset  . Heart attack Father 45   Allergies  Allergen Reactions  . Bee Venom     REACTION: Hives and Swelling  . Bupropion Hcl     REACTION: Mood Changes  . Cephalexin     REACTION: Hives and Swelling  . Latex   . Neosporin [Neomycin-Bacitracin Zn-Polymyx] Hives   Current Outpatient Prescriptions on File Prior to Visit  Medication Sig Dispense Refill  . albuterol (VENTOLIN HFA) 108 (90 BASE) MCG/ACT inhaler Inhale 2 puffs into the lungs every 6 (six) hours as needed. (Patient taking differently: Inhale 2 puffs into the lungs every 6 (six) hours as needed for wheezing. ) 1 Inhaler 1  . Alum & Mag Hydroxide-Simeth (ANTACID ANTI-GAS PO) Take 1 tablet by mouth daily as needed (for gas).    Marland Kitchen aspirin 81 MG tablet Take 81 mg by mouth daily.    . calcium carbonate (TUMS - DOSED IN MG ELEMENTAL CALCIUM) 500 MG chewable  tablet Chew 1 tablet by mouth daily.      . calcium-vitamin D (OSCAL WITH D) 500-200 MG-UNIT per tablet Take 1 tablet by mouth daily with breakfast.    . cetirizine-pseudoephedrine (ZYRTEC-D ALLERGY & CONGESTION) 5-120 MG per tablet Take 1 tablet by mouth daily.     . chlorpheniramine-HYDROcodone (TUSSIONEX PENNKINETIC ER) 10-8 MG/5ML LQCR Take 5 mLs by mouth at bedtime as needed. 115 mL 0  . Cholecalciferol (VITAMIN D) 2000 UNITS CAPS Take 1 capsule by mouth daily.      Marland Kitchen docusate sodium (COLACE) 100 MG capsule Take 1 capsule (100 mg total) by mouth 2 (two) times daily as needed for mild constipation. 20 capsule 1  . ECHINACEA-GOLDEN SEAL PO Take 1 tablet by mouth 2 (two) times daily.    . hydrochlorothiazide (HYDRODIURIL) 25 MG tablet Take 1 tablet (25 mg total) by mouth daily. 30 tablet 11  . ibuprofen (ADVIL,MOTRIN)  800 MG tablet Take 1 tablet (800 mg total) by mouth every 8 (eight) hours as needed. 30 tablet 0  . magnesium 30 MG tablet Take 30 mg by mouth daily.    . Melatonin 10 MG TABS Take 1 tablet by mouth at bedtime as needed (for sleep).    . methocarbamol (ROBAXIN) 500 MG tablet Take 1 tablet (500 mg total) by mouth 3 (three) times daily between meals as needed for muscle spasms. 40 tablet 1  . Multiple Vitamin (MULTIVITAMIN) tablet Take 1 tablet by mouth daily.      . norethindrone-ethinyl estradiol-iron (MICROGESTIN FE,GILDESS FE,LOESTRIN FE) 1.5-30 MG-MCG tablet Take 1 tablet by mouth daily.    Marland Kitchen oxyCODONE-acetaminophen (PERCOCET) 7.5-325 MG per tablet Take 1 tablet by mouth every 4 (four) hours as needed for pain. 60 tablet 0  . pseudoephedrine (SUDAFED) 120 MG 12 hr tablet Take 120 mg by mouth every 12 (twelve) hours as needed for congestion.    . silver sulfADIAZINE (SILVADENE) 1 % cream Apply 1 application topically 2 (two) times daily. 50 g 0  . St Johns Wort 300 MG CAPS Take 3 capsules by mouth 2 (two) times daily.     . traMADol (ULTRAM) 50 MG tablet Take 1 tablet (50 mg total) by mouth every 12 (twelve) hours as needed for moderate pain. 30 tablet 0  . traZODone (DESYREL) 150 MG tablet Take 1 tablet (150 mg total) by mouth at bedtime. 30 tablet 3  . traZODone (DESYREL) 50 MG tablet Take 0.5-1 tablets (25-50 mg total) by mouth at bedtime as needed for sleep. 30 tablet 3  . vitamin C (ASCORBIC ACID) 500 MG tablet Take 500 mg by mouth 2 (two) times daily.    . Zinc Sulfate (ZINC 15 PO) Take by mouth.    Marland Kitchen acetaminophen (TYLENOL) 650 MG CR tablet Take 1,300 mg by mouth every 8 (eight) hours as needed for pain.     No current facility-administered medications on file prior to visit.     Review of Systems Review of Systems  Constitutional: Negative for fever, appetite change, fatigue and unexpected weight change.  Eyes: Negative for pain and visual disturbance.  Respiratory: Negative for  cough and shortness of breath.   Cardiovascular: Negative for cp or palpitations    Gastrointestinal: Negative for nausea, diarrhea and constipation.  Genitourinary: Negative for urgency and frequency.  Skin: Negative for pallor or rash  pos for painful skin lesions  Neurological: Negative for weakness, light-headedness, numbness and headaches.  Hematological: Negative for adenopathy. Does not bruise/bleed easily.  Psychiatric/Behavioral: Negative for  dysphoric mood. The patient is not nervous/anxious.         Objective:   Physical Exam  Constitutional: She appears well-developed and well-nourished. No distress.  Morbidly obese and well appearing   HENT:  Head: Normocephalic and atraumatic.  Right Ear: External ear normal.  Left Ear: External ear normal.  Mouth/Throat: Oropharynx is clear and moist.  Eyes: Conjunctivae and EOM are normal. Pupils are equal, round, and reactive to light. Right eye exhibits no discharge. Left eye exhibits no discharge. No scleral icterus.  Neck: Normal range of motion. Neck supple.  Cardiovascular: Normal rate and regular rhythm.   Pulmonary/Chest: Effort normal and breath sounds normal.  Musculoskeletal: She exhibits no edema or tenderness.  Lymphadenopathy:    She has no cervical adenopathy.  Neurological: She is alert. No cranial nerve deficit.  Skin: Skin is warm and dry.  Erythematous papule with surrounding edema ant to R ear - no drainage/ moderately tender   Erythematous papule with scab inf to L ear  Mildly tender No drainage  Small erythematous papule on R lower leg    No rashes noted   Many small skin scars on arms and legs   Psychiatric: She has a normal mood and affect.          Assessment & Plan:   Problem List Items Addressed This Visit    Skin infection - Primary    Infected papules without drainage in front of R ear and under L ear  Also small erythematous papule on R lower leg (pt also has lots of scars) Unsure  etiology Warned not to scratch and adv to cut nails Cover with doxycycline  bactroban ointment Clean with soap and water Change linens/etc Update if not starting to improve in a week or if worsening        Relevant Medications   mupirocin ointment (BACTROBAN) 2 %

## 2014-12-28 NOTE — Progress Notes (Signed)
Pre visit review using our clinic review tool, if applicable. No additional management support is needed unless otherwise documented below in the visit note. 

## 2014-12-28 NOTE — Assessment & Plan Note (Signed)
Infected papules without drainage in front of R ear and under L ear  Also small erythematous papule on R lower leg (pt also has lots of scars) Unsure etiology Warned not to scratch and adv to cut nails Cover with doxycycline  bactroban ointment Clean with soap and water Change linens/etc Update if not starting to improve in a week or if worsening

## 2014-12-29 ENCOUNTER — Ambulatory Visit: Payer: BLUE CROSS/BLUE SHIELD | Admitting: Internal Medicine

## 2015-01-09 ENCOUNTER — Ambulatory Visit (HOSPITAL_BASED_OUTPATIENT_CLINIC_OR_DEPARTMENT_OTHER): Payer: BLUE CROSS/BLUE SHIELD | Attending: Pulmonary Disease | Admitting: Radiology

## 2015-01-09 DIAGNOSIS — G4733 Obstructive sleep apnea (adult) (pediatric): Secondary | ICD-10-CM | POA: Insufficient documentation

## 2015-01-09 DIAGNOSIS — R0683 Snoring: Secondary | ICD-10-CM | POA: Insufficient documentation

## 2015-01-09 DIAGNOSIS — G4736 Sleep related hypoventilation in conditions classified elsewhere: Secondary | ICD-10-CM | POA: Diagnosis not present

## 2015-01-10 ENCOUNTER — Encounter: Payer: Self-pay | Admitting: Family Medicine

## 2015-01-10 ENCOUNTER — Ambulatory Visit: Payer: BLUE CROSS/BLUE SHIELD | Admitting: Family Medicine

## 2015-01-10 ENCOUNTER — Ambulatory Visit (INDEPENDENT_AMBULATORY_CARE_PROVIDER_SITE_OTHER): Payer: BLUE CROSS/BLUE SHIELD | Admitting: Family Medicine

## 2015-01-10 VITALS — BP 110/82 | HR 81 | Temp 98.4°F | Ht 66.5 in | Wt 344.5 lb

## 2015-01-10 DIAGNOSIS — M76822 Posterior tibial tendinitis, left leg: Secondary | ICD-10-CM | POA: Diagnosis not present

## 2015-01-10 DIAGNOSIS — M7672 Peroneal tendinitis, left leg: Secondary | ICD-10-CM | POA: Diagnosis not present

## 2015-01-10 DIAGNOSIS — M79672 Pain in left foot: Secondary | ICD-10-CM | POA: Diagnosis not present

## 2015-01-10 NOTE — Progress Notes (Signed)
Dr. Frederico Hamman T. Carolan Avedisian, MD, [facility] [place], [address] Phone: [phone] Fax: [phone]  01/10/2015  Patient: Danielle Irwin, MRN: [id number], DOB: [birth year], 44 y.o.  Primary Physician:  Arnette Norris, MD  Chief Complaint: Ankle Pain and Foot Pain  Subjective:   Danielle Irwin is a 44 y.o. very pleasant female patient who presents with the following:  BMI 55 patient with ankle and foot pain:  In the left onset of pain on the left greater than right foot, medially and laterally as well as pain in the patient's artery itself. Of note she has had ongoing back pain and had surgery number of months ago, she has ongoing bilateral knee pain, and she has a BMI 55. She will to [facility] on her feet all day long.  She is not had any kind of traumatic injury at all. She is not having any numbness or tingling. No significant forefoot pain.  PT Peroneal INstep pain  L feet - pain in the forefoot and pain in instep B. L > R  L arch  Arch binders Sports insoles ASO brace  Past Medical History, Surgical History, Social History, Family History, Problem List, Medications, and Allergies have been reviewed and updated if relevant.  Patient Active Problem List   Diagnosis Date Noted  . Skin infection 12/28/2014  . Bilateral knee pain 11/11/2014  . Peripheral edema 11/11/2014  . Systolic murmur 37/62/8315  . OSA (obstructive sleep apnea) 10/31/2014  . Well woman exam 10/26/2014  . Myalgia and myositis 10/26/2014  . Fatigue 10/26/2014  . Hair loss 10/26/2014  . Nevus 10/26/2014  . Left facial pain 10/26/2014  . Morbid obesity 10/26/2014  . Somatic complaints, multiple 10/26/2014  . Insomnia 06/21/2014  . Anxiety 06/21/2014  . Spinal stenosis at L4-L5 level 06/02/2014  . Lumbar radicular pain 02/13/2012  . POLYCYSTIC OVARIAN DISEASE 06/19/2010    Past Medical History  Diagnosis Date  . OA (osteoarthritis)   . PCOS  (polycystic ovarian syndrome)   . Depression   . Obesity   . Vitamin D deficiency   . Family history of adverse reaction to anesthesia     FATHER WAS SLOW TO WAKE UP  . Asthma   . Headache     OCCASIONAL MIGRAINES  . HNP (herniated nucleus pulposus), lumbar   . Sensitive skin   . Insomnia   . Hx of epistaxis     Past Surgical History  Procedure Laterality Date  . Wisdom tooth extraction    . Lumbar laminectomy/decompression microdiscectomy Right 06/02/2014    Procedure: MICRO LUMBAR DECOMPRESSION L4 - L5 ON THE RIGHT 1 LEVEL;  Surgeon: Johnn Hai, MD;  Location: [facility];  Service: Orthopedics;  Laterality: Right;    History   Social History  . Marital Status: Single    Spouse Name: N/A  . Number of Children: N/A  . Years of Education: N/A   Occupational History  . Not on file.   Social History Main Topics  . Smoking status: Former Smoker    Quit date: 05/26/1990  . Smokeless tobacco: Never Used  . Alcohol Use: No  . Drug Use: No  . Sexual Activity: Not on file   Other Topics Concern  . Not on file   Social History Narrative    Family History  Problem Relation Age of Onset  . Heart attack Father 70    Allergies  Allergen Reactions  . Bee  Venom     REACTION: Hives and Swelling  . Bupropion Hcl     REACTION: Mood Changes  . Cephalexin     REACTION: Hives and Swelling  . Latex   . Neosporin [Neomycin-Bacitracin Zn-Polymyx] Hives    Medication list reviewed and updated in full in [facility].  GEN: No fevers, chills. Nontoxic. Primarily MSK c/o today. MSK: Detailed in the HPI GI: tolerating PO intake without difficulty Neuro: No numbness, parasthesias, or tingling associated. Otherwise the pertinent positives of the ROS are noted above.   Objective:   BP 110/82 mmHg  Pulse 81  Temp(Src) 98.4 F (36.9 C) (Oral)  Ht 5' 6.5" (1.689 m)  Wt 344 lb 8 oz (156.264 kg)  BMI 54.78 kg/m2  LMP 12/28/2014   GEN: WDWN, NAD, Non-toxic, Alert &  Oriented x 3 HEENT: Atraumatic, Normocephalic.  Ears and Nose: No external deformity. EXTR: No clubbing/cyanosis/edema NEURO: Normal gait, antalgia  PSYCH: Normally interactive. Conversant. Not depressed or anxious appearing.  Calm demeanor.   FEET: L Echymosis: no Edema: no ROM: full LE B Gait: heel toe, antalgic MT pain: no Callus pattern: none Lateral Mall: NT Medial Mall: NT Talus: NT Navicular: NT Cuboid: NT Calcaneous: NT Metatarsals: NT 5th MT: NT Phalanges: NT Achilles: NT Plantar Fascia: NT Fat Pad: NT Peroneals: TTP Post Tib: TTP Great Toe: Nml motion Ant Drawer: neg ATFL: NT CFL: NT Deltoid: NT Other foot breakdown: none Long arch: TTP ALONG PF, MODERATE BREAKDOWN Transverse arch: EXTENSIVE BREAKDOWN Hindfoot breakdown: none Sensation: intact   Radiology: No results found.  Assessment and Plan:   Peroneal tendonitis, left  Posterior tibial tendinitis, left  Left foot pain  Peroneal tendinopathy, posterior tib tendinopathy, and chronic overuse and pain along her instep including the plantar fascia. Work and weight are likely contributing, and potentially prior pain conditions.  Reviewed rehabilitation with the patient, gave her ASO brace, sports insoles and some arch binders.  Patient Instructions  Try the Black ankle brace with the green insoles 1st - can also try the beige arch binder, too (try a different day +/- the black brace)  Posterior Tib and arch rehab Begin with easy walking, heel, toe and backwards * Try to pick an easy location like a hallway or a room in your house and do one of these each time that you go through this area.  Towel "Scrunch Ups" Use a hand towel or a moderate size towel Foot flat down on the towel Use toes to "scrunch up the towel" straight up and down, and going to the right and left.  3 sets of 20 * Can be done watching TV, reading, or sitting and relaxing.      Signed,  Maud Deed. Vitaly Wanat,  MD   Patient's Medications  New Prescriptions   No medications on file  Previous Medications   ACETAMINOPHEN (TYLENOL) 650 MG CR TABLET    Take 1,300 mg by mouth every 8 (eight) hours as needed for pain.   ALBUTEROL (VENTOLIN HFA) 108 (90 BASE) MCG/ACT INHALER    Inhale 2 puffs into the lungs every 6 (six) hours as needed.   ALUM & MAG HYDROXIDE-SIMETH (ANTACID ANTI-GAS PO)    Take 1 tablet by mouth daily as needed (for gas).   ASPIRIN 81 MG TABLET    Take 81 mg by mouth daily.   CALCIUM CARBONATE (TUMS - DOSED IN MG ELEMENTAL CALCIUM) 500 MG CHEWABLE TABLET    Chew 1 tablet by mouth daily.  CALCIUM-VITAMIN D (OSCAL WITH D) 500-200 MG-UNIT PER TABLET    Take 1 tablet by mouth daily with breakfast.   CETIRIZINE-PSEUDOEPHEDRINE (ZYRTEC-D ALLERGY & CONGESTION) 5-120 MG PER TABLET    Take 1 tablet by mouth daily.    CHLORPHENIRAMINE-HYDROCODONE (TUSSIONEX PENNKINETIC ER) 10-8 MG/5ML LQCR    Take 5 mLs by mouth at bedtime as needed.   CHOLECALCIFEROL (VITAMIN D) 2000 UNITS CAPS    Take 1 capsule by mouth daily.     DOCUSATE SODIUM (COLACE) 100 MG CAPSULE    Take 1 capsule (100 mg total) by mouth 2 (two) times daily as needed for mild constipation.   ECHINACEA-GOLDEN SEAL PO    Take 1 tablet by mouth 2 (two) times daily.   HYDROCHLOROTHIAZIDE (HYDRODIURIL) 25 MG TABLET    Take 1 tablet (25 mg total) by mouth daily.   IBUPROFEN (ADVIL,MOTRIN) 800 MG TABLET    Take 1 tablet (800 mg total) by mouth every 8 (eight) hours as needed.   MAGNESIUM 30 MG TABLET    Take 30 mg by mouth daily.   MELATONIN 10 MG TABS    Take 1 tablet by mouth at bedtime as needed (for sleep).   METHOCARBAMOL (ROBAXIN) 500 MG TABLET    Take 1 tablet (500 mg total) by mouth 3 (three) times daily between meals as needed for muscle spasms.   MULTIPLE VITAMIN (MULTIVITAMIN) TABLET    Take 1 tablet by mouth daily.     MUPIROCIN OINTMENT (BACTROBAN) 2 %    Apply 1 application topically 2 (two) times daily. To affected area    NORETHINDRONE-ETHINYL ESTRADIOL-IRON (MICROGESTIN FE,GILDESS FE,LOESTRIN FE) 1.5-30 MG-MCG TABLET    Take 1 tablet by mouth daily.   OXYCODONE-ACETAMINOPHEN (PERCOCET) 7.5-325 MG PER TABLET    Take 1 tablet by mouth every 4 (four) hours as needed for pain.   PSEUDOEPHEDRINE (SUDAFED) 120 MG 12 HR TABLET    Take 120 mg by mouth every 12 (twelve) hours as needed for congestion.   SILVER SULFADIAZINE (SILVADENE) 1 % CREAM    Apply 1 application topically 2 (two) times daily.   ST JOHNS WORT 300 MG CAPS    Take 3 capsules by mouth 2 (two) times daily.    TRAMADOL (ULTRAM) 50 MG TABLET    Take 1 tablet (50 mg total) by mouth every 12 (twelve) hours as needed for moderate pain.   TRAZODONE (DESYREL) 150 MG TABLET    Take 1 tablet (150 mg total) by mouth at bedtime.   TRAZODONE (DESYREL) 50 MG TABLET    Take 0.5-1 tablets (25-50 mg total) by mouth at bedtime as needed for sleep.   VITAMIN C (ASCORBIC ACID) 500 MG TABLET    Take 500 mg by mouth 2 (two) times daily.   ZINC SULFATE (ZINC 15 PO)    Take by mouth.  Modified Medications   No medications on file  Discontinued Medications   DOXYCYCLINE (VIBRA-TABS) 100 MG TABLET    Take 1 tablet (100 mg total) by mouth 2 (two) times daily. Take with non dairy food   MICROGESTIN FE 1/20 1-20 MG-MCG TABLET

## 2015-01-10 NOTE — Patient Instructions (Signed)
Try the Black ankle brace with the green insoles 1st - can also try the beige arch binder, too (try a different day +/- the black brace)  Posterior Tib and arch rehab Begin with easy walking, heel, toe and backwards * Try to pick an easy location like a hallway or a room in your house and do one of these each time that you go through this area.  Towel "Scrunch Ups" Use a hand towel or a moderate size towel Foot flat down on the towel Use toes to "scrunch up the towel" straight up and down, and going to the right and left.  3 sets of 20 * Can be done watching TV, reading, or sitting and relaxing.

## 2015-01-10 NOTE — Progress Notes (Signed)
Pre visit review using our clinic review tool, if applicable. No additional management support is needed unless otherwise documented below in the visit note. 

## 2015-01-18 ENCOUNTER — Telehealth: Payer: Self-pay | Admitting: Pulmonary Disease

## 2015-01-18 DIAGNOSIS — G4730 Sleep apnea, unspecified: Secondary | ICD-10-CM | POA: Diagnosis not present

## 2015-01-18 DIAGNOSIS — G4733 Obstructive sleep apnea (adult) (pediatric): Secondary | ICD-10-CM

## 2015-01-18 NOTE — Progress Notes (Signed)
Patient Name: Danielle Irwin, Danielle Irwin Date: 01/09/2015 Gender: Female D.O.B: [birth year] Age (years): 23 Referring Provider: Kara Mead MD, ABSM Height (inches): 67 Interpreting Physician: Kara Mead MD, ABSM Weight (lbs): 346 RPSGT: Carolin Coy BMI: 56 MRN: [id number] Neck Size: 14.50   CLINICAL INFORMATION Sleep Study Type: NPSG  Indication for sleep study: Excessive Daytime Sleepiness, Fatigue, Obesity, OSA, Snoring  Epworth Sleepiness Score:  MEDICATIONS Medications administered by patient during sleep study : Sleep medicine administered - None SLEEP STUDY TECHNIQUE As per the AASM Manual for the Scoring of Sleep and Associated Events v2.3 (April 2016) with a hypopnea requiring 4% desaturations.  The channels recorded and monitored were frontal, central and occipital EEG, electrooculogram (EOG), submentalis EMG (chin), nasal and oral airflow, thoracic and abdominal wall motion, anterior tibialis EMG, snore microphone, electrocardiogram, and pulse oximetry.  RESPIRATORY PARAMETERS There were a total of 26 respiratory disturbances out of which 3 were apneas ( 1 obstructive, 0 mixed, 2 central) and 23 hypopneas. The apnea/hypopnea index (AHI) was 5.8 events/hour. RDI was 11.6/h. The central sleep apnea index was 0.4 events/hour. The REM AHI was 13.0 events/hour and NREM AHI was 3.7 events/hour. The supine AHI was 17.9 events/hour and the non supine AHI was 4.62 supine during 8.70% of sleep. Respiratory disturbances were associated with oxygen desaturation down to a nadir of 88.00% during sleep. The mean oxygen saturation during the study was 93.99%.  SLEEP ARCHITECTURE The study was initiated at 11:11:30 PM and terminated at 5:12:47 AM. The total recorded time was 361.3 minutes. EEG confirmed total sleep time was 270.0 minutes yielding a sleep efficiency of 74.7%. Sleep onset after lights out was 33.4 minutes with a REM latency of 116.0 minutes. The patient spent 4.44% of the night in  stage N1 sleep, 73.33% in stage N2 sleep, 0.00% in stage N3 and 22.22% in REM. Wake after sleep onset (WASO) was 57.9 minutes. The Arousal Index was 9.6/hour. Split Night criteria was not met.  LEG MOVEMENT DATA The total Periodic Limb Movements of Sleep (PLMS) were 81. The PLMS index was 18.00 .  CARDIAC DATA The 2 lead EKG demonstrated sinus rhythm. The mean heart rate was 71.91 beats per minute. Other EKG findings include: None.  IMPRESSIONS Mild obstructive sleep apnea occurred during this study (AHI = 5.8/hour). No significant central sleep apnea occurred during this study (CAI = 0.4). Severe oxygen desaturation was noted during this study (Min O2 = 88.00). The patient snored with Moderate snoring volume during the diagnostic portion of the study. No cardiac abnormalities were noted during this study. Mild periodic limb movements of sleep occurred during the study.  DIAGNOSIS Obstructive Sleep Apnea (327.23 [G47.33 ICD-10]) Nocturnal Hypoxemia (327.26 [G47.36 ICD-10])  RECOMMENDATIONS Therapeutic CPAP titration to determine optimal pressure required to alleviate sleep disordered breathing. Alternatively, oral appliance can be used or weight loss can be advised as the only intervention at this time. Positional therapy avoiding supine position during sleep. Avoid alcohol, sedatives and other CNS depressants that may worsen sleep apnea and disrupt normal sleep architecture. Sleep hygiene should be reviewed to assess factors that may improve sleep quality. Weight management and regular exercise should be initiated or continued.  Kara Mead MD. [place] Flood.  Pulmonary

## 2015-01-18 NOTE — Telephone Encounter (Signed)
She had mild OSA- AHI 11/hour Weight loss alone may be recommended. If she is symptomatic, can pursue CPAP titration or dental device

## 2015-01-18 NOTE — Addendum Note (Signed)
Addended by: Rigoberto Noel on: 01/18/2015 05:16 PM   Modules accepted: Level of Service

## 2015-01-19 NOTE — Telephone Encounter (Signed)
Pl order CPAP titration

## 2015-01-19 NOTE — Telephone Encounter (Signed)
Spoke with pt about rec of RA Pt stated that when she used CPAP during test she felt much better and was able to breath better Pt is not interested in dental device at this time Pt would like to pursue CPAP titration and also stated that she would try to lose weight  Dr Elsworth Soho, are you ok with ordering CPAP titration at this time? Please advise

## 2015-01-19 NOTE — Telephone Encounter (Signed)
Pt returned call [phone]

## 2015-01-19 NOTE — Telephone Encounter (Signed)
lmtcb

## 2015-01-20 NOTE — Telephone Encounter (Signed)
CPAP titration study ordered Patient notified. Nothing further needed.

## 2015-01-25 ENCOUNTER — Ambulatory Visit (INDEPENDENT_AMBULATORY_CARE_PROVIDER_SITE_OTHER): Payer: BLUE CROSS/BLUE SHIELD | Admitting: Family Medicine

## 2015-01-25 ENCOUNTER — Encounter: Payer: Self-pay | Admitting: Family Medicine

## 2015-01-25 VITALS — BP 129/66 | HR 85 | Temp 98.5°F | Ht 66.5 in | Wt 346.8 lb

## 2015-01-25 DIAGNOSIS — M25511 Pain in right shoulder: Secondary | ICD-10-CM | POA: Diagnosis not present

## 2015-01-25 MED ORDER — OXYCODONE-ACETAMINOPHEN 7.5-325 MG PO TABS: 0.5000 | ORAL_TABLET | ORAL | Status: DC | PRN

## 2015-01-25 NOTE — Progress Notes (Signed)
Pre visit review using our clinic review tool, if applicable. No additional management support is needed unless otherwise documented below in the visit note. 

## 2015-01-25 NOTE — Assessment & Plan Note (Signed)
Acute /severe and started abruptly this am after reaching in front of her for a cart at work  Pt has mod to severe pain on exam and very limited rom due to pain - esp with abduction and flexion of shoulder  Rotator cuff injury or tear is suspected  For severe pain -pt has ibuprofen 800 mg to take tid at home with food  #15 percocet 7.5 given (she ordinarily gets from ortho-last reported refill April and out of it)- for acute pain - with caution  Ref urgently to ortho for eval and treatment She will continue ice and sling

## 2015-01-25 NOTE — Patient Instructions (Signed)
For shoulder pain - use ice/cold pack as often as you can  Take ibuprofen 800 mg three times daily with food  Oxycodone - as needed with caution of sedation and habit  I did an urgent referral to orthopedics - you will get a call from our office tomorrow about that

## 2015-01-25 NOTE — Progress Notes (Signed)
Subjective:    Patient ID: Danielle Irwin, female    DOB: January 07, 1971, 44 y.o.   MRN: [id number]  HPI Here with shoulder pain  R side  (is R handed)  Reached for a buggy at work and had sudden blinding pain in shoulder (anterior)  No swelling or bruising that she knows of  She did work the rest of the day- had to get an ice pack and a sling (that is holding it steady)   Most painful extend arm or abduct it  Was able to drive with her other hand   Never had a shoulder injury before   Last year this shoulder was "locking up on her" - dx with probable bursitis and a shot helped This pain is way worse  Took tylenol with lunch   No numbness or loss of grip  Pain does radiate slightly into hand   Patient Active Problem List   Diagnosis Date Noted  . Skin infection 12/28/2014  . Bilateral knee pain 11/11/2014  . Peripheral edema 11/11/2014  . Systolic murmur 41/93/7902  . OSA (obstructive sleep apnea) 10/31/2014  . Well woman exam 10/26/2014  . Myalgia and myositis 10/26/2014  . Fatigue 10/26/2014  . Hair loss 10/26/2014  . Nevus 10/26/2014  . Left facial pain 10/26/2014  . Morbid obesity 10/26/2014  . Somatic complaints, multiple 10/26/2014  . Insomnia 06/21/2014  . Anxiety 06/21/2014  . Spinal stenosis at L4-L5 level 06/02/2014  . Lumbar radicular pain 02/13/2012  . POLYCYSTIC OVARIAN DISEASE 06/19/2010   Past Medical History  Diagnosis Date  . OA (osteoarthritis)   . PCOS (polycystic ovarian syndrome)   . Depression   . Obesity   . Vitamin D deficiency   . Family history of adverse reaction to anesthesia     FATHER WAS SLOW TO WAKE UP  . Asthma   . Headache     OCCASIONAL MIGRAINES  . HNP (herniated nucleus pulposus), lumbar   . Sensitive skin   . Insomnia   . Hx of epistaxis    Past Surgical History  Procedure Laterality Date  . Wisdom tooth extraction    . Lumbar laminectomy/decompression microdiscectomy Right 06/02/2014    Procedure: MICRO LUMBAR  DECOMPRESSION L4 - L5 ON THE RIGHT 1 LEVEL;  Surgeon: Johnn Hai, MD;  Location: [facility];  Service: Orthopedics;  Laterality: Right;   Social History  Substance Use Topics  . Smoking status: Former Smoker    Quit date: 05/26/1990  . Smokeless tobacco: Never Used  . Alcohol Use: No   Family History  Problem Relation Age of Onset  . Heart attack Father 20   Allergies  Allergen Reactions  . Bee Venom     REACTION: Hives and Swelling  . Bupropion Hcl     REACTION: Mood Changes  . Cephalexin     REACTION: Hives and Swelling  . Latex   . Neosporin [Neomycin-Bacitracin Zn-Polymyx] Hives   Current Outpatient Prescriptions on File Prior to Visit  Medication Sig Dispense Refill  . acetaminophen (TYLENOL) 650 MG CR tablet Take 1,300 mg by mouth every 8 (eight) hours as needed for pain.    Marland Kitchen albuterol (VENTOLIN HFA) 108 (90 BASE) MCG/ACT inhaler Inhale 2 puffs into the lungs every 6 (six) hours as needed. (Patient taking differently: Inhale 2 puffs into the lungs every 6 (six) hours as needed for wheezing. ) 1 Inhaler 1  . Alum & Mag Hydroxide-Simeth (ANTACID ANTI-GAS PO) Take 1 tablet by mouth daily as  needed (for gas).    Marland Kitchen aspirin 81 MG tablet Take 81 mg by mouth daily.    . calcium carbonate (TUMS - DOSED IN MG ELEMENTAL CALCIUM) 500 MG chewable tablet Chew 1 tablet by mouth daily.      . calcium-vitamin D (OSCAL WITH D) 500-200 MG-UNIT per tablet Take 1 tablet by mouth daily with breakfast.    . cetirizine-pseudoephedrine (ZYRTEC-D ALLERGY & CONGESTION) 5-120 MG per tablet Take 1 tablet by mouth daily.     . chlorpheniramine-HYDROcodone (TUSSIONEX PENNKINETIC ER) 10-8 MG/5ML LQCR Take 5 mLs by mouth at bedtime as needed. 115 mL 0  . Cholecalciferol (VITAMIN D) 2000 UNITS CAPS Take 1 capsule by mouth daily.      Marland Kitchen docusate sodium (COLACE) 100 MG capsule Take 1 capsule (100 mg total) by mouth 2 (two) times daily as needed for mild constipation. 20 capsule 1  . ECHINACEA-GOLDEN SEAL PO  Take 1 tablet by mouth 2 (two) times daily.    . hydrochlorothiazide (HYDRODIURIL) 25 MG tablet Take 1 tablet (25 mg total) by mouth daily. 30 tablet 11  . ibuprofen (ADVIL,MOTRIN) 800 MG tablet Take 1 tablet (800 mg total) by mouth every 8 (eight) hours as needed. 30 tablet 0  . magnesium 30 MG tablet Take 30 mg by mouth daily.    . Melatonin 10 MG TABS Take 1 tablet by mouth at bedtime as needed (for sleep).    . methocarbamol (ROBAXIN) 500 MG tablet Take 1 tablet (500 mg total) by mouth 3 (three) times daily between meals as needed for muscle spasms. 40 tablet 1  . Multiple Vitamin (MULTIVITAMIN) tablet Take 1 tablet by mouth daily.      . mupirocin ointment (BACTROBAN) 2 % Apply 1 application topically 2 (two) times daily. To affected area 15 g 1  . norethindrone-ethinyl estradiol-iron (MICROGESTIN FE,GILDESS FE,LOESTRIN FE) 1.5-30 MG-MCG tablet Take 1 tablet by mouth daily.    Marland Kitchen oxyCODONE-acetaminophen (PERCOCET) 7.5-325 MG per tablet Take 1 tablet by mouth every 4 (four) hours as needed for pain. 60 tablet 0  . pseudoephedrine (SUDAFED) 120 MG 12 hr tablet Take 120 mg by mouth every 12 (twelve) hours as needed for congestion.    . silver sulfADIAZINE (SILVADENE) 1 % cream Apply 1 application topically 2 (two) times daily. 50 g 0  . St Johns Wort 300 MG CAPS Take 3 capsules by mouth 2 (two) times daily.     . traMADol (ULTRAM) 50 MG tablet Take 1 tablet (50 mg total) by mouth every 12 (twelve) hours as needed for moderate pain. 30 tablet 0  . traZODone (DESYREL) 150 MG tablet Take 1 tablet (150 mg total) by mouth at bedtime. 30 tablet 3  . traZODone (DESYREL) 50 MG tablet Take 0.5-1 tablets (25-50 mg total) by mouth at bedtime as needed for sleep. 30 tablet 3  . vitamin C (ASCORBIC ACID) 500 MG tablet Take 500 mg by mouth 2 (two) times daily.    . Zinc Sulfate (ZINC 15 PO) Take by mouth.     No current facility-administered medications on file prior to visit.     Review of Systems Review  of Systems  Constitutional: Negative for fever, appetite change, fatigue and unexpected weight change.  Eyes: Negative for pain and visual disturbance.  Respiratory: Negative for cough and shortness of breath.   Cardiovascular: Negative for cp or palpitations    Gastrointestinal: Negative for nausea, diarrhea and constipation.  Genitourinary: Negative for urgency and frequency.  Skin: Negative for  pallor or rash   MSK pos for chronic back/foot/knee pain , pos for new acute severe shoulder pain with limited rom  Neurological: Negative for weakness, light-headedness, numbness and headaches.  Hematological: Negative for adenopathy. Does not bruise/bleed easily.  Psychiatric/Behavioral: Negative for dysphoric mood. The patient is not nervous/anxious.         Objective:   Physical Exam  Constitutional: She appears well-developed and well-nourished. No distress.  Obese and uncomfortable appearing   HENT:  Head: Normocephalic and atraumatic.  Eyes: Conjunctivae and EOM are normal. Pupils are equal, round, and reactive to light.  Neck: Normal range of motion. Neck supple.  Cardiovascular: Normal rate and regular rhythm.   Musculoskeletal: She exhibits tenderness. She exhibits no edema.       Right shoulder: She exhibits decreased range of motion, tenderness and bony tenderness. She exhibits no swelling, no effusion, no crepitus, no deformity, normal pulse and normal strength.  Very tender over acromion process of R shoulder with no warmth or swelling  Pt holds shoulder close to body  Nl rom elbow/wrist and hand /nl grip  Nl perf and sens   R shoulder flex 20-30 deg, abduct 10-20 deg with pain  Unable to int or ext rotate shoulder at all due to pain   Pos Hawkings test- causing anterior shouulder pain   Lymphadenopathy:    She has no cervical adenopathy.  Neurological: She is alert. She displays no atrophy. No sensory deficit. She exhibits normal muscle tone.  Skin: Skin is warm and dry.  No erythema.  Psychiatric: She has a normal mood and affect.          Assessment & Plan:   Problem List Items Addressed This Visit    Right anterior shoulder pain - Primary    Acute /severe and started abruptly this am after reaching in front of her for a cart at work  Pt has mod to severe pain on exam and very limited rom due to pain - esp with abduction and flexion of shoulder  Rotator cuff injury or tear is suspected  For severe pain -pt has ibuprofen 800 mg to take tid at home with food  #15 percocet 7.5 given (she ordinarily gets from ortho-last reported refill April and out of it)- for acute pain - with caution  Ref urgently to ortho for eval and treatment She will continue ice and sling        Relevant Orders   Ambulatory referral to Orthopedic Surgery

## 2015-02-07 ENCOUNTER — Encounter: Payer: Self-pay | Admitting: Internal Medicine

## 2015-02-07 ENCOUNTER — Ambulatory Visit (INDEPENDENT_AMBULATORY_CARE_PROVIDER_SITE_OTHER): Payer: BLUE CROSS/BLUE SHIELD | Admitting: Internal Medicine

## 2015-02-07 VITALS — BP 118/80 | HR 93 | Temp 97.9°F | Resp 12 | Ht 66.5 in | Wt 337.4 lb

## 2015-02-07 DIAGNOSIS — R5383 Other fatigue: Secondary | ICD-10-CM | POA: Diagnosis not present

## 2015-02-07 NOTE — Progress Notes (Signed)
Patient ID: Danielle Irwin, female   DOB: [birth year], 44 y.o.   MRN: [id number]   HPI  Danielle Irwin is a 44 y.o.-year-old female, referred by her PCP, Dr. Deborra Irwin, in consultation for fatigue (? Endocrine causes).  Patient describes that she started to have fatigue for several years. She feels weak and is more tired when exercises.   She had back surgery in 05/2014 - on Percocet - started 02/2014 - also got out of work b/c could not walk >> sciatica.   She also describes: - + weight loss - believes it is fluid - + cold intolerance - even in the summer - + depression/+ anxiety - [place] Jones Wort helping. Tried Prozac, Wellbutrin >> SEs.  - no constipation/ + diarrhea - + bloating - occasional LLQ AP - + dry skin - + hair loss - worse since 2008 when she lost her job then  She had steroid inj in back - Fall 2015, knees - last Spring 2016, shoulder - last in shoulder was 2 weeks ago.  She had an extensive investigation by PCP, showing: - A normal vitamin B12, of 428 (previously 237, 4 years ago) - A normal vitamin D, of 36.5 - no diabetes, hemoglobin A1c 5.3% - normal thyroid tests: Lab Results  Component Value Date   TSH 1.46 10/26/2014   TSH 2.07 08/02/2010   FREET4 0.61 10/26/2014  - no anemia, however, at last check 3 months ago, her white blood cell count was elevated at 13.1 - ferritin was normal, at 58.1 - an ESR was high, at 40 - ANA was negative; CCP antibody <2.0, rheumatoid factor was negative - low alkaline phosphatase, with the last level improved at 38 (39-117) - normal kidney function - Elevated ALT, at 39 (<35)  Diet - lean meats + fruits and veggies - Breakfast: premade protein shake (wheat + soy) or eggs + bacon - Lunch: subway  - chicken and ham wrap - Dinner: meat (pork/venison/chicken) + veggies + rice or potatoes - Snacks: prepackaged apples + cheese + grapes  Rarely sodas - with stevia. Drinks a lot of carbonated water. Rarely sweet tea.    ROS: Constitutional: See HPI Eyes: + blurry vision, no xerophthalmia ENT: + sore throat, no nodules palpated in throat, no dysphagia/odynophagia, no hoarseness, + tinnistus Cardiovascular: no CP/SOB/palpitations/+ leg swelling Respiratory: no cough/SOB/+ wheezing Gastrointestinal: + N/no V/+ D/no C Musculoskeletal: no muscle/joint aches Skin: no rashes, + dry skin, + easy bruising, + hair loss, + excessive hair growth Neurological: no tremors/numbness/tingling/dizziness, + HA, + lightheadedness Psychiatric: + depression/+ anxiety  Past Medical History  Diagnosis Date  . OA (osteoarthritis)   . PCOS (polycystic ovarian syndrome)   . Depression   . Obesity   . Vitamin D deficiency   . Family history of adverse reaction to anesthesia     FATHER WAS SLOW TO WAKE UP  . Asthma   . Headache     OCCASIONAL MIGRAINES  . HNP (herniated nucleus pulposus), lumbar   . Sensitive skin   . Insomnia   . Hx of epistaxis    Past Surgical History  Procedure Laterality Date  . Wisdom tooth extraction    . Lumbar laminectomy/decompression microdiscectomy Right 06/02/2014    Procedure: MICRO LUMBAR DECOMPRESSION L4 - L5 ON THE RIGHT 1 LEVEL;  Surgeon: Johnn Hai, MD;  Location: [facility];  Service: Orthopedics;  Laterality: Right;   Social History   Social History  . Marital Status: Single  Spouse Name: N/A  . Number of Children: 0   Occupational History  . Retail sales   Social History Main Topics  . Smoking status: Former Smoker    Quit date: 05/26/1990  . Smokeless tobacco: Never Used  . Alcohol Use: No  . Drug Use: No   Current Outpatient Prescriptions on File Prior to Visit  Medication Sig Dispense Refill  . acetaminophen (TYLENOL) 650 MG CR tablet Take 1,300 mg by mouth every 8 (eight) hours as needed for pain.    Marland Kitchen albuterol (VENTOLIN HFA) 108 (90 BASE) MCG/ACT inhaler Inhale 2 puffs into the lungs every 6 (six) hours as needed. (Patient taking differently: Inhale 2  puffs into the lungs every 6 (six) hours as needed for wheezing. ) 1 Inhaler 1  . Alum & Mag Hydroxide-Simeth (ANTACID ANTI-GAS PO) Take 1 tablet by mouth daily as needed (for gas).    Marland Kitchen aspirin 81 MG tablet Take 81 mg by mouth daily.    . calcium carbonate (TUMS - DOSED IN MG ELEMENTAL CALCIUM) 500 MG chewable tablet Chew 1 tablet by mouth daily.      . calcium-vitamin D (OSCAL WITH D) 500-200 MG-UNIT per tablet Take 1 tablet by mouth daily with breakfast.    . cetirizine-pseudoephedrine (ZYRTEC-D ALLERGY & CONGESTION) 5-120 MG per tablet Take 1 tablet by mouth daily.     . chlorpheniramine-HYDROcodone (TUSSIONEX PENNKINETIC ER) 10-8 MG/5ML LQCR Take 5 mLs by mouth at bedtime as needed. 115 mL 0  . Cholecalciferol (VITAMIN D) 2000 UNITS CAPS Take 1 capsule by mouth daily.      Marland Kitchen docusate sodium (COLACE) 100 MG capsule Take 1 capsule (100 mg total) by mouth 2 (two) times daily as needed for mild constipation. 20 capsule 1  . ECHINACEA-GOLDEN SEAL PO Take 1 tablet by mouth 2 (two) times daily.    . hydrochlorothiazide (HYDRODIURIL) 25 MG tablet Take 1 tablet (25 mg total) by mouth daily. 30 tablet 11  . ibuprofen (ADVIL,MOTRIN) 800 MG tablet Take 1 tablet (800 mg total) by mouth every 8 (eight) hours as needed. 30 tablet 0  . magnesium 30 MG tablet Take 30 mg by mouth daily.    . Melatonin 10 MG TABS Take 1 tablet by mouth at bedtime as needed (for sleep).    . methocarbamol (ROBAXIN) 500 MG tablet Take 1 tablet (500 mg total) by mouth 3 (three) times daily between meals as needed for muscle spasms. 40 tablet 1  . Multiple Vitamin (MULTIVITAMIN) tablet Take 1 tablet by mouth daily.      . mupirocin ointment (BACTROBAN) 2 % Apply 1 application topically 2 (two) times daily. To affected area 15 g 1  . norethindrone-ethinyl estradiol-iron (MICROGESTIN FE,GILDESS FE,LOESTRIN FE) 1.5-30 MG-MCG tablet Take 1 tablet by mouth daily.    Marland Kitchen oxyCODONE-acetaminophen (PERCOCET) 7.5-325 MG per tablet Take 0.5-1  tablets by mouth every 4 (four) hours as needed. 15 tablet 0  . pseudoephedrine (SUDAFED) 120 MG 12 hr tablet Take 120 mg by mouth every 12 (twelve) hours as needed for congestion.    . silver sulfADIAZINE (SILVADENE) 1 % cream Apply 1 application topically 2 (two) times daily. 50 g 0  . St Johns Wort 300 MG CAPS Take 3 capsules by mouth 2 (two) times daily.     . traMADol (ULTRAM) 50 MG tablet Take 1 tablet (50 mg total) by mouth every 12 (twelve) hours as needed for moderate pain. 30 tablet 0  . traZODone (DESYREL) 150 MG tablet Take 1  tablet (150 mg total) by mouth at bedtime. 30 tablet 3  . traZODone (DESYREL) 50 MG tablet Take 0.5-1 tablets (25-50 mg total) by mouth at bedtime as needed for sleep. 30 tablet 3  . vitamin C (ASCORBIC ACID) 500 MG tablet Take 500 mg by mouth 2 (two) times daily.    . Zinc Sulfate (ZINC 15 PO) Take by mouth.     No current facility-administered medications on file prior to visit.   Allergies  Allergen Reactions  . Bee Venom     REACTION: Hives and Swelling  . Bupropion Hcl     REACTION: Mood Changes  . Cephalexin     REACTION: Hives and Swelling  . Latex   . Neosporin [Neomycin-Bacitracin Zn-Polymyx] Hives   Family History  Problem Relation Age of Onset  . Heart attack Father 20   PE: BP 118/80 mmHg  Pulse 93  Temp(Src) 97.9 F (36.6 C) (Oral)  Resp 12  Ht 5' 6.5" (1.689 m)  Wt 337 lb 6.4 oz (153.044 kg)  BMI 53.65 kg/m2  SpO2 97%  LMP 12/28/2014 Wt Readings from Last 3 Encounters:  02/07/15 337 lb 6.4 oz (153.044 kg)  01/25/15 346 lb 12 oz (157.285 kg)  01/10/15 344 lb 8 oz (156.264 kg)   Constitutional: morbid obesity, in NAD Eyes: PERRLA, EOMI, no exophthalmos ENT: moist mucous membranes, no thyromegaly, no cervical lymphadenopathy Cardiovascular: RRR, No MRG Respiratory: CTA B Gastrointestinal: abdomen soft, NT, ND, BS+ Musculoskeletal: no deformities, strength intact in all 4 Skin: moist, warm, no rashes Neurological: no  tremor with outstretched hands, DTR normal in all 4  ASSESSMENT: 1. Fatigue  PLAN:  1. We reviewed together all the tests obtained by PCP >> explained the results - see HPI. We discussed about possible causes for fatigue, to include but not be limited to: - diet - discussed at length about the benefits of a plant-based diet >> I strongly suggested she starts this as a means to increase her energy and lose weight - sedentarism - I explained that, while exercise is difficult now, she may want to start exercising after loses weight - not sleeping well - she is on Trazodone, Melatonin. She has OSA - dx recently >> will get a CPAP. - depression - on St John's Wort - OCPs - on LoEstrin - polypharmacy - on Percocet (shoulder pain), trazodone, St. John's wort, Robaxin - vitamin D def - last 25 vit D level normal - vitamin B12 def - last B12 level normal - anemia - last Hb normal - hypothyroidism - last TSH and free T4 normal - + repeated, recent steroid use. We discussed about many possible SEs of steroids >> including weight gain, fluid retention, fatigue, weakness, hyperglycemia and HTN. Strongly suggested that she stops getting injections if possible. She got her last injection <2 weeks ago >> we cannot check a coetisol now, but I plan to see her back in 6 mo >> will need a cortisol and ACTH then, may need a stim test, also. She agrees with this plan.  - time spent with the patient: 1 hour, of which >50% was spent in obtaining information about her symptoms, reviewing her previous labs, evaluations, and treatments, counseling her about her condition (please see the discussed topics above), and developing a plan to further investigate it; she had a number of questions which I addressed.

## 2015-02-07 NOTE — Patient Instructions (Signed)
Please schedule a new appt in 6 months.  Please consider the following ways to cut down carbs and fat and increase fiber and micronutrients in your diet: - substitute whole grain for white bread or pasta - substitute brown rice for white rice - substitute 90-calorie flat bread pieces for slices of bread when possible - substitute sweet potatoes or yams for white potatoes - substitute humus for margarine - substitute tofu for cheese when possible - substitute almond or rice milk for regular milk (would not drink soy milk daily due to concern for soy estrogen influence on breast cancer risk) - substitute dark chocolate for other sweets when possible - substitute water - can add lemon or orange slices for taste - for diet sodas (artificial sweeteners will trick your body that you can eat sweets without getting calories and will lead you to overeating and weight gain in the long run) - do not skip breakfast or other meals (this will slow down the metabolism and will result in more weight gain over time)  - can try smoothies made from fruit and almond/rice milk in am instead of regular breakfast - can also try old-fashioned (not instant) oatmeal made with almond/rice milk in am - order the dressing on the side when eating salad at a restaurant (pour less than half of the dressing on the salad) - eat as little meat as possible - can try juicing, but should not forget that juicing will get rid of the fiber, so would alternate with eating raw veg./fruits or drinking smoothies - use as little oil as possible, even when using olive oil - can dress a salad with a mix of balsamic vinegar and lemon juice, for e.g. - use agave nectar, stevia sugar, or regular sugar rather than artificial sweateners - steam or broil/roast veggies  - snack on veggies/fruit/nuts (unsalted, preferably) when possible, rather than processed foods - reduce or eliminate aspartame in diet (it is in diet sodas, chewing gum, etc) Read  the labels!  Try to read Dr. Janene Harvey book: "Program for Reversing Diabetes" for other ideas for healthy eating.  Plant-based diet materials: - Lectures (you tube):  Alyssa Grove: "Breaking the Food Seduction"  Doug Lisle: "How to Lose Weight, without Losing Your Mind" - Documentaries:  [place] over [employer], Sick and Nearly Dead  The [employer]  Overweight and undernourished - Books:  Alyssa Grove: "Program for Reversing Diabetes"  Heath Gold: "The [place] Study"  Norma Fredrickson: "Supermarket Vegan" (cookbook) - Facebook pages:   Moshe Salisbury versus Knives  Vegucated  [place] [employer] - Healthy nutrition info websites:  https://www.martin.info/

## 2015-02-15 ENCOUNTER — Ambulatory Visit: Payer: BLUE CROSS/BLUE SHIELD | Attending: Physician Assistant | Admitting: Physical Therapy

## 2015-02-15 ENCOUNTER — Encounter: Payer: Self-pay | Admitting: Physical Therapy

## 2015-02-15 DIAGNOSIS — M6281 Muscle weakness (generalized): Secondary | ICD-10-CM | POA: Diagnosis present

## 2015-02-15 DIAGNOSIS — M25511 Pain in right shoulder: Secondary | ICD-10-CM | POA: Diagnosis present

## 2015-02-15 DIAGNOSIS — M62838 Other muscle spasm: Secondary | ICD-10-CM | POA: Insufficient documentation

## 2015-02-16 NOTE — Therapy (Signed)
[facility] [address] [place], [address] Phone: [phone]   Fax:  [phone]  Physical Therapy Evaluation  Patient Details  Name: Danielle Irwin MRN: [id number] Date of Birth: 05/19/1971 Referring Provider:  Gerrit Halls, PA-C  Encounter Date: 02/15/2015      PT End of Session - 02/15/15 1900    Visit Number 1   Number of Visits 8   Date for PT Re-Evaluation 03/15/15   PT Start Time 1805   PT Stop Time 1900   PT Time Calculation (min) 55 min   Activity Tolerance Patient tolerated treatment well   Behavior During Therapy [facility] for tasks assessed/performed      Past Medical History  Diagnosis Date  . OA (osteoarthritis)   . PCOS (polycystic ovarian syndrome)   . Depression   . Obesity   . Vitamin D deficiency   . Family history of adverse reaction to anesthesia     FATHER WAS SLOW TO WAKE UP  . Asthma   . Headache     OCCASIONAL MIGRAINES  . HNP (herniated nucleus pulposus), lumbar   . Sensitive skin   . Insomnia   . Hx of epistaxis     Past Surgical History  Procedure Laterality Date  . Wisdom tooth extraction    . Lumbar laminectomy/decompression microdiscectomy Right 06/02/2014    Procedure: MICRO LUMBAR DECOMPRESSION L4 - L5 ON THE RIGHT 1 LEVEL;  Surgeon: Johnn Hai, MD;  Location: [facility];  Service: Orthopedics;  Laterality: Right;    There were no vitals filed for this visit.  Visit Diagnosis:  Right shoulder pain - Plan: PT plan of care cert/re-cert  Spasm of muscle - Plan: PT plan of care cert/re-cert  Muscle weakness of right upper extremity - Plan: PT plan of care cert/re-cert      Subjective Assessment - 02/15/15 1823    Subjective Paitent reports she is currently recovering from right shoulder pain. She reports she still has pinching in right shoulder with raising arm overhead.    Pertinent History Patient reports she began having pian in right shoulder about 3  weeks ago after pushing a cart at [employer]. She was seen by MD and received a cortisone injection and this seems to have helped.    Patient Stated Goals to be able to raise arm overhead and use it without difficulty or pain   Currently in Pain? No/denies            [facility] PT Assessment - 02/15/15 2331    Assessment   Medical Diagnosis right shoulder pain, tendonitis   Onset Date/Surgical Date 01/25/15   Hand Dominance Right   Next MD Visit unknown   Prior Therapy none   Precautions   Precautions None   Restrictions   Weight Bearing Restrictions No   Balance Screen   Has the patient fallen in the past 6 months No   Has the patient had a decrease in activity level because of a fear of falling?  No   Is the patient reluctant to leave their home because of a fear of falling?  No   Home Ecologist residence   Living Arrangements Parent   Prior Function   Level of Independence Independent      Objective: AROM; right shoulder WNL's with increased pain at end range of forward elevation Strength: strong and painful with right shoulder abduction, mild pain with ER Palpation; point tender  anterior aspect of right shoulder, + spasms with decreased soft tissue mobility along upper trapezius right shoulder and spasms along infraspinatus Impingement sign + with Michel Bickers and increased difficulty and pain in shoulder with crossing over to opposite shoulder Posture; + hiking of right shoulder  Treatment; Instructed in impingement right shoulder and proper mechanics needed to avoid pain in shoulder and muscles that needed to be re educated to decrease pain and improved alignment and posture  Electrical stimulation to decrease spasms and re educate periscapular muscles right shoulder: high volt for muscle spasms applied to upper trapezius and infraspinatus x 20 min., Russian stim. 10/10 cycle applied to medial border of right scapula and lower trapezius for re  education Instructed in home program for posture control and scapular adduction  Patient response to treatment: able to perform crossing right UE to left shoulder with greater ease of motion and decreased pain in anterior aspect of shoulder, improved posture noted with decreased hiking of right shoulder, improved scapular control noted following treatment and with repetition and verbal cues         PT Education - 02/15/15 1845    Education provided Yes   Education Details Instructed in proper postion of shoulder to avoid impingement and exercise to imrpove scapular control with adduction throghout the day, scapula down and rotate back   Person(s) Educated Patient   Methods Explanation;Demonstration;Verbal cues   Comprehension Verbalized understanding;Returned demonstration;Verbal cues required             PT Long Term Goals - 02/15/15 1931    PT LONG TERM GOAL #1   Title Paitent will demonstrate improved function with Right UE for daily tasks with quick Dash score of 10% or less self perceived impairment by 03/15/2015   Baseline QuickDash score = 20%   Status New   PT LONG TERM GOAL #2   Title Patient will be independent with home program for self management of pain and be able to perform exercises wihtout cuing by 03/2015   Baseline limited knowledge of pain control strategies, progression of appropriate exercise   Status New               Plan - 02/15/15 1930    Clinical Impression Statement Patient is a 44 year old right hand dominant female who presents with rotator cuff tendonitis that is improving at this time since initial onset ~3 weeks ago. She continues to have pain in shoulder with raising her arm overhead and has quick Dash impairment score of  20% .  She has decreased motor control and weakness in scapular control and posture awareness and will benefit from physical therapy intervenition to improve limitations in order to return to prior level of funciton.     Pt will benefit from skilled therapeutic intervention in order to improve on the following deficits Decreased strength;Pain;Impaired UE functional use;Increased muscle spasms   Rehab Potential Good   Clinical Impairments Affecting Rehab Potential (+) motivated, acute condition   PT Frequency 2x / week   PT Duration 4 weeks   PT Treatment/Interventions Manual techniques;Neuromuscular re-education;Patient/family education;Cryotherapy;Electrical Stimulation;Moist Heat;Iontophoresis 4mg /ml Dexamethasone;Therapeutic exercise;Ultrasound   PT Next Visit Plan pain control, progressive exercises to improve control/strength right UE   Consulted and Agree with Plan of Care Patient         Problem List Patient Active Problem List   Diagnosis Date Noted  . Right anterior shoulder pain 01/25/2015  . Skin infection 12/28/2014  . Bilateral knee pain 11/11/2014  .  Peripheral edema 11/11/2014  . Systolic murmur 72/15/8727  . OSA (obstructive sleep apnea) 10/31/2014  . Well woman exam 10/26/2014  . Myalgia and myositis 10/26/2014  . Fatigue 10/26/2014  . Hair loss 10/26/2014  . Nevus 10/26/2014  . Left facial pain 10/26/2014  . Morbid obesity 10/26/2014  . Somatic complaints, multiple 10/26/2014  . Insomnia 06/21/2014  . Anxiety 06/21/2014  . Spinal stenosis at L4-L5 level 06/02/2014  . Lumbar radicular pain 02/13/2012  . POLYCYSTIC OVARIAN DISEASE 06/19/2010    Jomarie Longs PT 02/16/2015, 9:10 PM  [facility] [address] [place], [address] Phone: [phone]   Fax:  [phone]

## 2015-02-20 ENCOUNTER — Ambulatory Visit: Payer: BLUE CROSS/BLUE SHIELD | Admitting: Physical Therapy

## 2015-02-20 ENCOUNTER — Encounter: Payer: Self-pay | Admitting: Physical Therapy

## 2015-02-20 DIAGNOSIS — M62838 Other muscle spasm: Secondary | ICD-10-CM

## 2015-02-20 DIAGNOSIS — M25511 Pain in right shoulder: Secondary | ICD-10-CM

## 2015-02-20 DIAGNOSIS — M6281 Muscle weakness (generalized): Secondary | ICD-10-CM

## 2015-02-20 NOTE — Therapy (Signed)
[facility] [address], [place], [address] Phone: [phone]   Fax:  [phone]  Physical Therapy Treatment  Patient Details  Name: Danielle Irwin MRN: [id number] Date of Birth: Mar 26, 1971 Referring Provider:  Gerrit Halls, PA-C  Encounter Date: 02/20/2015      PT End of Session - 02/20/15 0845    Visit Number 2   Number of Visits 8   Date for PT Re-Evaluation 03/15/15   PT Start Time 0805   PT Stop Time 0845   PT Time Calculation (min) 40 min   Activity Tolerance Patient tolerated treatment well   Behavior During Therapy [facility] for tasks assessed/performed      Past Medical History  Diagnosis Date  . OA (osteoarthritis)   . PCOS (polycystic ovarian syndrome)   . Depression   . Obesity   . Vitamin D deficiency   . Family history of adverse reaction to anesthesia     FATHER WAS SLOW TO WAKE UP  . Asthma   . Headache     OCCASIONAL MIGRAINES  . HNP (herniated nucleus pulposus), lumbar   . Sensitive skin   . Insomnia   . Hx of epistaxis     Past Surgical History  Procedure Laterality Date  . Wisdom tooth extraction    . Lumbar laminectomy/decompression microdiscectomy Right 06/02/2014    Procedure: MICRO LUMBAR DECOMPRESSION L4 - L5 ON THE RIGHT 1 LEVEL;  Surgeon: Johnn Hai, MD;  Location: [facility];  Service: Orthopedics;  Laterality: Right;    There were no vitals filed for this visit.  Visit Diagnosis:  Right shoulder pain  Spasm of muscle  Muscle weakness of right upper extremity      Subjective Assessment - 02/20/15 0807    Subjective Patient reports she has been doing her stretches as instructed and is a little sore today. No problems reported from previous session.    Patient Stated Goals to be able to raise arm overhead and use it without difficulty or pain   Currently in Pain? No/denies   Multiple Pain Sites No       Objective:   Palpation; point tender anterior  aspect of right shoulder, + spasms with decreased soft tissue mobility along upper trapezius right shoulder and spasms along infraspinatus Special tests: + increased pain right shoulder empty can test and Michel Bickers + right shoulder, + increased pain with crossing right hand to left shoulder      [facility] Adult PT Treatment/Exercise - 02/20/15 0809    Exercises   Exercises Other Exercises   Other Exercises  Upper trapezius stretching 3 x 20 seconds with assistance of therapist for correct alignment, scapular adduction with downward rotation with verbal cuing. Instructed in scapular control with rows with blue resistive band and demonstration with verbal cues, lat pull downs with blue resistive band with assistance x 15 reps.     Modalities   Modalities Electrical Stimulation;Moist Probation officer Location right shoulder   Electrical Stimulation Parameters high volt estim. right shoulder anterior aspect and upper trapezius muscle x 15 min. and [place] stim. 10/10 cycle to right scapula medial aspect middle and lower trapezezius muscle with moist heat applied to right shoulder    Electrical Stimulation Goals Neuromuscular facilitation;Pain  muscle spasms   Manual Therapy   Manual Therapy Soft tissue mobilization   Manual therapy comments + upper trapezius and rigth cervical spine spasms palpable  Soft tissue mobilization STM right upper trapezius and cervical spine muscles superficial techniques for muscle spasm reduction       Patient response to treatment: able to actively cross right UE to left shoulder with greater ease of motion and decreased pain in anterior aspect of shoulder with STM and estim., improved posture noted with decreased hiking of right shoulder, improved scapular control noted with demonstration and repetition wtih verbal cues           PT Education - 02/20/15 0832    Education Details Instructed in additional exercises for  scapular control/stabilization exercises for right shoulder. Required demonstration and verbal cues to perfrom with correct technique/position of shoulders.    Person(s) Educated Patient   Methods Demonstration;Verbal cues;Explanation   Comprehension Verbalized understanding;Returned demonstration;Verbal cues required             PT Long Term Goals - 02/15/15 1931    PT LONG TERM GOAL #1   Title Paitent will demonstrate improved function with Right UE for daily tasks with quick Dash score of 10% or less self perceived impairment by 03/15/2015   Baseline QuickDash score = 20%   Status New   PT LONG TERM GOAL #2   Title Patient will be independent with home program for self management of pain and be able to perform exercises wihtout cuing by 03/2015   Baseline limited knowledge of pain control strategies, progression of appropriate exercise   Status New               Plan - 02/20/15 0830    Clinical Impression Statement  Patient is progressing with exercises and ability to perform exercises with correct tehnique for stabilization. She is progressing towards goals for return to prior level of function without pain/difficulty.   Pt will benefit from skilled therapeutic intervention in order to improve on the following deficits Decreased strength;Pain;Impaired UE functional use;Increased muscle spasms   Rehab Potential Good   PT Frequency 2x / week   PT Duration 4 weeks   PT Treatment/Interventions Manual techniques;Neuromuscular re-education;Patient/family education;Cryotherapy;Electrical Stimulation;Moist Heat;Iontophoresis 4mg /ml Dexamethasone;Therapeutic exercise;Ultrasound        Problem List Patient Active Problem List   Diagnosis Date Noted  . Right anterior shoulder pain 01/25/2015  . Skin infection 12/28/2014  . Bilateral knee pain 11/11/2014  . Peripheral edema 11/11/2014  . Systolic murmur 56/25/6389  . OSA (obstructive sleep apnea) 10/31/2014  . Well woman  exam 10/26/2014  . Myalgia and myositis 10/26/2014  . Fatigue 10/26/2014  . Hair loss 10/26/2014  . Nevus 10/26/2014  . Left facial pain 10/26/2014  . Morbid obesity 10/26/2014  . Somatic complaints, multiple 10/26/2014  . Insomnia 06/21/2014  . Anxiety 06/21/2014  . Spinal stenosis at L4-L5 level 06/02/2014  . Lumbar radicular pain 02/13/2012  . POLYCYSTIC OVARIAN DISEASE 06/19/2010    Jomarie Longs PT 02/20/2015, 2:24 PM  [facility] [address] [place], [address] Phone: [phone]   Fax:  [phone]

## 2015-02-27 ENCOUNTER — Ambulatory Visit: Payer: BLUE CROSS/BLUE SHIELD | Admitting: Physical Therapy

## 2015-02-27 ENCOUNTER — Encounter: Payer: Self-pay | Admitting: Physical Therapy

## 2015-02-27 DIAGNOSIS — M25511 Pain in right shoulder: Secondary | ICD-10-CM

## 2015-02-27 DIAGNOSIS — M62838 Other muscle spasm: Secondary | ICD-10-CM

## 2015-02-27 DIAGNOSIS — M6281 Muscle weakness (generalized): Secondary | ICD-10-CM

## 2015-02-27 NOTE — Therapy (Signed)
[facility] [address], [place], [address] Phone: [phone]   Fax:  [phone]  Physical Therapy Treatment  Patient Details  Name: Danielle Irwin MRN: [id number] Date of Birth: [birth year] Referring Karel Mowers:  Gerrit Halls, PA-C  Encounter Date: 02/27/2015      PT End of Session - 02/27/15 1919    Visit Number 3   Number of Visits 8   Date for PT Re-Evaluation 03/15/15   PT Start Time 3716   PT Stop Time 1850   PT Time Calculation (min) 52 min   Activity Tolerance Patient tolerated treatment well   Behavior During Therapy [facility] for tasks assessed/performed      Past Medical History  Diagnosis Date  . OA (osteoarthritis)   . PCOS (polycystic ovarian syndrome)   . Depression   . Obesity   . Vitamin D deficiency   . Family history of adverse reaction to anesthesia     FATHER WAS SLOW TO WAKE UP  . Asthma   . Headache     OCCASIONAL MIGRAINES  . HNP (herniated nucleus pulposus), lumbar   . Sensitive skin   . Insomnia   . Hx of epistaxis     Past Surgical History  Procedure Laterality Date  . Wisdom tooth extraction    . Lumbar laminectomy/decompression microdiscectomy Right 06/02/2014    Procedure: MICRO LUMBAR DECOMPRESSION L4 - L5 ON THE RIGHT 1 LEVEL;  Surgeon: Johnn Hai, MD;  Location: [facility];  Service: Orthopedics;  Laterality: Right;    There were no vitals filed for this visit.  Visit Diagnosis:  Right shoulder pain  Spasm of muscle  Muscle weakness of right upper extremity      Subjective Assessment - 02/27/15 1756    Subjective Patient reports being a litlle sore in shoulder and from working out with her bands.    Patient Stated Goals to be able to raise arm overhead and use it without difficulty or pain   Currently in Pain? Yes   Pain Score 2    Pain Location Shoulder   Pain Orientation Right   Pain Descriptors / Indicators Aching;Sore   Aggravating Factors   reaching back overhead    Pain Relieving Factors rest, not reaching overhead   Multiple Pain Sites No         Objective:   Palpation; point tender anterior aspect of right shoulder, + spasms with decreased soft tissue mobility along upper trapezius right shoulder and spasms along infraspinatus Special tests: + mild increased pain right shoulder empty can test and Michel Bickers + right shoulder, + increased pain with crossing right hand to left shoulder, decreased as compared to previous session        [facility] Adult PT Treatment/Exercise - 02/27/15 1758    Exercises   Exercises Other Exercises   Other Exercises  Upper trapezius stretching 3 x 20 seconds with assistance of therapist for correct alignment, scapular adduction with downward rotation with verbal cuing. Upper trapezius stretching 3 x 20 seconds with assistance of therapist for correct alignment, scapular adduction with downward rotation with verbal cuing. Instructed in scapular control with rows with blue resistive band and demonstration with verbal cues, lat pull downs with blue resistive band with assistance x 15 reps.    Modalities   Modalities Electrical Stimulation;Moist Probation officer Location and parameters right shoulder high volt estim. right shoulder anterior aspect and upper trapezius muscle  x 20 min. and [place] stim. 10/10 cycle to right scapula medial aspect middle and lower trapezezius muscle with moist heat applied to right shoulder    Electrical Stimulation Goals Neuromuscular facilitation;Pain  muscle spasms   Manual Therapy   Manual Therapy Soft tissue mobilization   Manual therapy comments + upper trapezius and rigth cervical spine spasms palpable    Soft tissue mobilization STM right upper trapezius and cervical spine muscles with patient on treatment table      Patient response to treatment: able to actively cross right UE to left shoulder with greater ease of  motion and decreased pain in anterior aspect of shoulder with STM and estim., improved posture noted with decreased hiking of right shoulder with all exercises with minimal verbal cuing today, improved scapular control noted with demonstration and repetition wtih verbal cues         PT Long Term Goals - 02/15/15 1931    PT LONG TERM GOAL #1   Title Paitent will demonstrate improved function with Right UE for daily tasks with quick Dash score of 10% or less self perceived impairment by 03/15/2015   Baseline QuickDash score = 20%   Status New   PT LONG TERM GOAL #2   Title Patient will be independent with home program for self management of pain and be able to perform exercises wihtout cuing by 03/2015   Baseline limited knowledge of pain control strategies, progression of appropriate exercise   Status New               Plan - 02/27/15 1920    Clinical Impression Statement Patient improving with decreased pain, improved ROM with less anterior shoulder pain. She continues with spasms and difficulty with raising arm above shoulder level and will benefit from additional physical therapy intervention to further control pain and improve strength in order to return to full functional use right UE for daily tasks.    Pt will benefit from skilled therapeutic intervention in order to improve on the following deficits Decreased strength;Pain;Impaired UE functional use;Increased muscle spasms   Rehab Potential Good   PT Frequency 2x / week   PT Duration 4 weeks   PT Treatment/Interventions Manual techniques;Neuromuscular re-education;Patient/family education;Cryotherapy;Electrical Stimulation;Moist Heat;Iontophoresis 4mg /ml Dexamethasone;Therapeutic exercise;Ultrasound        Problem List Patient Active Problem List   Diagnosis Date Noted  . Right anterior shoulder pain 01/25/2015  . Skin infection 12/28/2014  . Bilateral knee pain 11/11/2014  . Peripheral edema 11/11/2014  . Systolic  murmur 37/34/2876  . OSA (obstructive sleep apnea) 10/31/2014  . Well woman exam 10/26/2014  . Myalgia and myositis 10/26/2014  . Fatigue 10/26/2014  . Hair loss 10/26/2014  . Nevus 10/26/2014  . Left facial pain 10/26/2014  . Morbid obesity 10/26/2014  . Somatic complaints, multiple 10/26/2014  . Insomnia 06/21/2014  . Anxiety 06/21/2014  . Spinal stenosis at L4-L5 level 06/02/2014  . Lumbar radicular pain 02/13/2012  . POLYCYSTIC OVARIAN DISEASE 06/19/2010    Jomarie Longs PT 02/27/2015, 7:22 PM  [facility] [address] [place], [address] Phone: [phone]   Fax:  [phone]

## 2015-03-01 ENCOUNTER — Encounter: Payer: Self-pay | Admitting: Physical Therapy

## 2015-03-01 ENCOUNTER — Ambulatory Visit: Payer: BLUE CROSS/BLUE SHIELD | Admitting: Physical Therapy

## 2015-03-01 DIAGNOSIS — M6281 Muscle weakness (generalized): Secondary | ICD-10-CM

## 2015-03-01 DIAGNOSIS — M25511 Pain in right shoulder: Secondary | ICD-10-CM

## 2015-03-01 DIAGNOSIS — M62838 Other muscle spasm: Secondary | ICD-10-CM

## 2015-03-01 NOTE — Therapy (Signed)
[facility] [address] [place], [address] Phone: [phone]   Fax:  [phone]  Physical Therapy Treatment  Patient Details  Name: Danielle Irwin MRN: [id number] Date of Birth: 1970-08-22 Referring Provider:  Gerrit Halls, PA-C  Encounter Date: 03/01/2015      PT End of Session - 03/01/15 2325    Visit Number 4   Number of Visits 8   Date for PT Re-Evaluation 03/15/15   PT Start Time 2694   PT Stop Time 1815   PT Time Calculation (min) 50 min   Activity Tolerance Patient tolerated treatment well   Behavior During Therapy [facility] for tasks assessed/performed      Past Medical History  Diagnosis Date  . OA (osteoarthritis)   . PCOS (polycystic ovarian syndrome)   . Depression   . Obesity   . Vitamin D deficiency   . Family history of adverse reaction to anesthesia     FATHER WAS SLOW TO WAKE UP  . Asthma   . Headache     OCCASIONAL MIGRAINES  . HNP (herniated nucleus pulposus), lumbar   . Sensitive skin   . Insomnia   . Hx of epistaxis     Past Surgical History  Procedure Laterality Date  . Wisdom tooth extraction    . Lumbar laminectomy/decompression microdiscectomy Right 06/02/2014    Procedure: MICRO LUMBAR DECOMPRESSION L4 - L5 ON THE RIGHT 1 LEVEL;  Surgeon: Johnn Hai, MD;  Location: [facility];  Service: Orthopedics;  Laterality: Right;    There were no vitals filed for this visit.  Visit Diagnosis:  Right shoulder pain  Spasm of muscle  Muscle weakness of right upper extremity      Subjective Assessment - 03/01/15 1726    Subjective Patient reports she is stiff and sore on right side of neck into shoulder. This began yesterday on waking. She has tried to do exercises for shoulder and this seemed to hurt. She currently reports stiffness and aching 3-4/10 into right side of neck and shoulder and has difficulty with turning head to the right             Objective:   Palpation; point tender anterior aspect of right shoulder, + spasms with decreased soft tissue mobility along upper trapezius right shoulder and spasms along right cervical paraspinal muscles AROM: decreased cervical spine rotation to right 50% limited as compared to left rotation Special tests: + mild increased pain right shoulder empty can test and Michel Bickers + right shoulder, + increased pain with crossing right hand to left shoulder        [facility] Adult PT Treatment/Exercise - 03/01/15 1728    Exercises   Exercises Other Exercises   Other Exercises  Upper trapezius stretching 3 x 20 seconds with assistance of therapist for correct alignment, scapular adduction with downward rotation with verbal cuing. isometric rotation left with active rotation tot right x 5 reps,  Instructed in scapular control with rows with cable 10# in standing x 10 reps and lat pull downs with 10# with tactile and verbal cues to perform with correct alignment of shoulders and to engage appropriate muscles, lower trapezius to stabilize shoulder   Modalities   Modalities Electrical Stimulation;Moist Heat   Electrical Stimulation   Electrical Stimulation Location right shoulder high volt estim. right shoulder anterior aspect and upper trapezius muscle x 20 min. and [place] stim. 10/10 cycle to right scapula medial aspect middle and lower trapezezius  muscle with moist heat applied to right shoulder    Electrical Stimulation Goals Neuromuscular facilitation;Pain  muscle spasms   Manual Therapy   Manual Therapy Soft tissue mobilization   Manual therapy comments + upper trapezius and rigth cervical spine spasms palpable    Soft tissue mobilization STM right upper trapezius and cervical spine muscles to decreased spasms and pain to improve shoulder forward elevation above shoulder level for personal care with less difficulty          Patient response to treatment: able to actively cross right UE to left shoulder  with greater ease of motion and decreased pain in anterior aspect of shoulder following treatment with STM and estim., improved posture noted with decreased hiking of right shoulder with all exercises with minimal verbal cuing today, improved scapular control noted with demonstration and repetition        PT Education - 03/01/15 1800    Education provided Yes   Education Details verbal cing and demonstration for scapular stabilizaiton exercises and re educated in posture and correct position of right shoulder to decrease pain in right shoulder in sitting    Person(s) Educated Patient   Methods Explanation;Verbal cues   Comprehension Verbalized understanding;Returned demonstration;Verbal cues required             PT Long Term Goals - 02/15/15 1931    PT LONG TERM GOAL #1   Title Paitent will demonstrate improved function with Right UE for daily tasks with quick Dash score of 10% or less self perceived impairment by 03/15/2015   Baseline QuickDash score = 20%   Status New   PT LONG TERM GOAL #2   Title Patient will be independent with home program for self management of pain and be able to perform exercises wihtout cuing by 03/2015   Baseline limited knowledge of pain control strategies, progression of appropriate exercise   Status New               Plan - 03/01/15 1820    Clinical Impression Statement Patient demonstrated improved soft tissue elasticity in right upper trapezius and cervical spine which allowed her to move right shoulder with less pain and difficulty and able to cross right arm infront of body with less difficulty.    Pt will benefit from skilled therapeutic intervention in order to improve on the following deficits Decreased strength;Pain;Impaired UE functional use;Increased muscle spasms   Rehab Potential Good   PT Frequency 2x / week   PT Duration 4 weeks   PT Treatment/Interventions Manual techniques;Neuromuscular re-education;Patient/family  education;Cryotherapy;Electrical Stimulation;Moist Heat;Iontophoresis 4mg /ml Dexamethasone;Therapeutic exercise;Ultrasound   PT Next Visit Plan pain control, progressive exercises to improve control/strength right UE        Problem List Patient Active Problem List   Diagnosis Date Noted  . Right anterior shoulder pain 01/25/2015  . Skin infection 12/28/2014  . Bilateral knee pain 11/11/2014  . Peripheral edema 11/11/2014  . Systolic murmur 59/74/1638  . OSA (obstructive sleep apnea) 10/31/2014  . Well woman exam 10/26/2014  . Myalgia and myositis 10/26/2014  . Fatigue 10/26/2014  . Hair loss 10/26/2014  . Nevus 10/26/2014  . Left facial pain 10/26/2014  . Morbid obesity 10/26/2014  . Somatic complaints, multiple 10/26/2014  . Insomnia 06/21/2014  . Anxiety 06/21/2014  . Spinal stenosis at L4-L5 level 06/02/2014  . Lumbar radicular pain 02/13/2012  . POLYCYSTIC OVARIAN DISEASE 06/19/2010    Jomarie Longs PT 03/01/2015, 11:38 PM  [facility] [address] [place], [address] Phone: [phone]   Fax:  [phone]

## 2015-03-07 ENCOUNTER — Encounter: Payer: Self-pay | Admitting: Physical Therapy

## 2015-03-07 ENCOUNTER — Ambulatory Visit: Payer: BLUE CROSS/BLUE SHIELD | Admitting: Physical Therapy

## 2015-03-07 DIAGNOSIS — M25511 Pain in right shoulder: Secondary | ICD-10-CM | POA: Diagnosis not present

## 2015-03-07 DIAGNOSIS — M6281 Muscle weakness (generalized): Secondary | ICD-10-CM

## 2015-03-07 DIAGNOSIS — M62838 Other muscle spasm: Secondary | ICD-10-CM

## 2015-03-07 NOTE — Therapy (Signed)
[facility] [address] [place], [address] Phone: [phone]   Fax:  [phone]  Physical Therapy Treatment  Patient Details  Name: Danielle Irwin MRN: [id number] Date of Birth: 1971-05-09 Referring Provider:  Gerrit Halls, PA-C  Encounter Date: 03/07/2015      PT End of Session - 03/07/15 1906    Visit Number 5   Number of Visits 8   Date for PT Re-Evaluation 03/15/15   PT Start Time 1856   PT Stop Time 1945   PT Time Calculation (min) 49 min   Activity Tolerance Patient tolerated treatment well   Behavior During Therapy [facility] for tasks assessed/performed      Past Medical History  Diagnosis Date  . OA (osteoarthritis)   . PCOS (polycystic ovarian syndrome)   . Depression   . Obesity   . Vitamin D deficiency   . Family history of adverse reaction to anesthesia     FATHER WAS SLOW TO WAKE UP  . Asthma   . Headache     OCCASIONAL MIGRAINES  . HNP (herniated nucleus pulposus), lumbar   . Sensitive skin   . Insomnia   . Hx of epistaxis     Past Surgical History  Procedure Laterality Date  . Wisdom tooth extraction    . Lumbar laminectomy/decompression microdiscectomy Right 06/02/2014    Procedure: MICRO LUMBAR DECOMPRESSION L4 - L5 ON THE RIGHT 1 LEVEL;  Surgeon: Johnn Hai, MD;  Location: [facility];  Service: Orthopedics;  Laterality: Right;    There were no vitals filed for this visit.  Visit Diagnosis:  Right shoulder pain  Spasm of muscle  Muscle weakness of right upper extremity      Subjective Assessment - 03/07/15 1857    Subjective Patient reports she is still having stiffness in right shoulder into right side of neck and shoulder. She is most concerned about her neck and headaches. Overall right shoulder is improving. she has intermittent symptoms with reaching behind back to buttock region.    Patient Stated Goals to be able to raise arm overhead and use it without  difficulty or pain   Currently in Pain? Yes   Pain Score 2    Pain Location Shoulder   Pain Orientation Right   Pain Descriptors / Indicators Aching;Sore   Pain Type Chronic pain   Pain Onset More than a month ago   Pain Frequency Intermittent   Multiple Pain Sites No         Objective:  Palpation; point tender anterior aspect of right shoulder, + spasms with decreased soft tissue mobility along upper trapezius right shoulder and spasms along right cervical paraspinal muscles AROM: right shoulder WNL's for forward elevation, rotations with difficulty with behind back motion (IR) Special tests: + mild increased pain right shoulder empty can test and Michel Bickers + right shoulder, + increased pain with crossing right hand to left shoulder      [facility] Adult PT Treatment/Exercise - 03/07/15 1905    Exercises   Exercises Other Exercises   Other Exercises  Upper trapezius stretching 3 x 20 seconds with assistance of therapist for correct alignment, scapular adduction with downward rotation with verbal cuing. isometric rotation left with active rotation tot right x 5 reps, Instructed in scapular control with rows with cable 10# in standing x 10 reps and lat pull downs with 10# with tactile and verbal cues to perform with correct alignment of shoulders and to  engage appropriate muscles, lower trapezius to stabilize shoulder   Modalities   Modalities Electrical Stimulation;Moist Heat   Electrical Stimulation   Electrical Stimulation Location parameters right shoulder high volt estim. right shoulder anterior aspect and upper trapezius muscle x 20 min. and [place] stim. 10/10 cycle to right scapula medial aspect middle and lower trapezezius muscle with moist heat applied to right shoulder x 20 min.   Electrical Stimulation Goals Neuromuscular facilitation;Pain  muscle spasms   Manual Therapy   Manual Therapy Soft tissue mobilization   Manual therapy comments + upper trapezius and  rigth cervical spine spasms palpable    Soft tissue mobilization STM right upper trapezius and cervical spine muscles with patient seated in chair       Patient response to treatment: able to actively cross right UE to left shoulder with greater ease of motion and decreased pain in anterior aspect of shoulder following treatment with STM and estim., improved posture noted with decreased hiking of right shoulder with all exercises with minimal verbal cuing, improved scapular control noted with all exercises as well        PT Education - 03/07/15 1930    Education provided Yes   Education Details re inforced posture, positions to decrease strain on right shoulder with sitting/sleeping, avoid overhead motions for now as well   Person(s) Educated Patient   Methods Explanation;Demonstration;Verbal cues   Comprehension Verbalized understanding;Returned demonstration;Verbal cues required             PT Long Term Goals - 02/15/15 1931    PT LONG TERM GOAL #1   Title Paitent will demonstrate improved function with Right UE for daily tasks with quick Dash score of 10% or less self perceived impairment by 03/15/2015   Baseline QuickDash score = 20%   Status New   PT LONG TERM GOAL #2   Title Patient will be independent with home program for self management of pain and be able to perform exercises wihtout cuing by 03/2015   Baseline limited knowledge of pain control strategies, progression of appropriate exercise   Status New               Plan - 03/07/15 1955    Clinical Impression Statement Patient demonstrates improvement towards goals with decreased tenderness and progressing with exercises with more independence and ability to perform with less cuing. She continues with pain with overhead motions.    Pt will benefit from skilled therapeutic intervention in order to improve on the following deficits Decreased strength;Pain;Impaired UE functional use;Increased muscle spasms    Rehab Potential Good   PT Frequency 2x / week   PT Duration 4 weeks   PT Treatment/Interventions Manual techniques;Neuromuscular re-education;Patient/family education;Cryotherapy;Electrical Stimulation;Moist Heat;Iontophoresis 4mg /ml Dexamethasone;Therapeutic exercise;Ultrasound   PT Next Visit Plan pain control, progressive exercises to improve control/strength right UE        Problem List Patient Active Problem List   Diagnosis Date Noted  . Right anterior shoulder pain 01/25/2015  . Skin infection 12/28/2014  . Bilateral knee pain 11/11/2014  . Peripheral edema 11/11/2014  . Systolic murmur 16/60/6301  . OSA (obstructive sleep apnea) 10/31/2014  . Well woman exam 10/26/2014  . Myalgia and myositis 10/26/2014  . Fatigue 10/26/2014  . Hair loss 10/26/2014  . Nevus 10/26/2014  . Left facial pain 10/26/2014  . Morbid obesity 10/26/2014  . Somatic complaints, multiple 10/26/2014  . Insomnia 06/21/2014  . Anxiety 06/21/2014  . Spinal stenosis at L4-L5 level 06/02/2014  . Lumbar radicular pain  02/13/2012  . POLYCYSTIC OVARIAN DISEASE 06/19/2010    Jomarie Longs PT 03/08/2015, 2:21 PM  [facility] [address] [place], [address] Phone: [phone]   Fax:  [phone]

## 2015-03-09 ENCOUNTER — Encounter: Payer: Self-pay | Admitting: Physical Therapy

## 2015-03-09 ENCOUNTER — Ambulatory Visit: Payer: BLUE CROSS/BLUE SHIELD | Attending: Physician Assistant | Admitting: Physical Therapy

## 2015-03-09 ENCOUNTER — Encounter: Payer: BLUE CROSS/BLUE SHIELD | Admitting: Physical Therapy

## 2015-03-09 DIAGNOSIS — M6281 Muscle weakness (generalized): Secondary | ICD-10-CM | POA: Diagnosis present

## 2015-03-09 DIAGNOSIS — M25511 Pain in right shoulder: Secondary | ICD-10-CM | POA: Insufficient documentation

## 2015-03-09 DIAGNOSIS — M62838 Other muscle spasm: Secondary | ICD-10-CM | POA: Diagnosis present

## 2015-03-09 NOTE — Therapy (Signed)
[facility] [address], [place], [address] Phone: [phone]   Fax:  [phone]  Physical Therapy Treatment  Patient Details  Name: Danielle Irwin MRN: [id number] Date of Birth: 1970-07-26 Referring Provider:  Molli Barrows, PA  Encounter Date: 03/09/2015      PT End of Session - 03/09/15 1931    Visit Number 6   Number of Visits 8   Date for PT Re-Evaluation 03/15/15   PT Start Time 1929   PT Stop Time 2015   PT Time Calculation (min) 46 min   Activity Tolerance Patient tolerated treatment well   Behavior During Therapy [facility] for tasks assessed/performed      Past Medical History  Diagnosis Date  . OA (osteoarthritis)   . PCOS (polycystic ovarian syndrome)   . Depression   . Obesity   . Vitamin D deficiency   . Family history of adverse reaction to anesthesia     FATHER WAS SLOW TO WAKE UP  . Asthma   . Headache     OCCASIONAL MIGRAINES  . HNP (herniated nucleus pulposus), lumbar   . Sensitive skin   . Insomnia   . Hx of epistaxis     Past Surgical History  Procedure Laterality Date  . Wisdom tooth extraction    . Lumbar laminectomy/decompression microdiscectomy Right 06/02/2014    Procedure: MICRO LUMBAR DECOMPRESSION L4 - L5 ON THE RIGHT 1 LEVEL;  Surgeon: Johnn Hai, MD;  Location: [facility];  Service: Orthopedics;  Laterality: Right;    There were no vitals filed for this visit.  Visit Diagnosis:  Right shoulder pain  Spasm of muscle  Muscle weakness of right upper extremity      Subjective Assessment - 03/09/15 1927    Subjective Patient reports she had a rough day at work and her shoulder is sore and stiff on arrival to therapy. She reports she feels much better following therapy sessions and is exercising some at home, needs to do more.    Patient Stated Goals to be able to raise arm overhead and use it without difficulty or pain   Currently in Pain? Yes   Pain Score 4     Pain Location Shoulder   Pain Orientation Right   Pain Descriptors / Indicators Aching;Tightness   Pain Type Chronic pain   Pain Onset More than a month ago   Pain Frequency Intermittent        Objective: Palpation: right upper trapezius and cervical spine muscles with + spasms and tenderness with decreased lateral flexion to left and + hiking of right shoulder  AROM: right shoulder WFL's for elevation and rotations          [facility] Adult PT Treatment/Exercise - 03/09/15 1931    Exercises   Exercises Other Exercises   Other Exercises  Upper trapezius stretching 3 x 20 seconds with assistance of therapist for correct alignment, scapular adduction with downward rotation with verbal cuing. Isometric IR and ER with assistance and verbal cues x 10 reps    Modalities   Modalities Electrical Stimulation;Moist Heat   Electrical Stimulation   Electrical Stimulation Location right shoulder   Electrical Stimulation Parameters and Goals high volt estim. right shoulder anterior aspect and upper trapezius muscle x 20 min. and [place] stim. 10/10 cycle to right scapula medial aspect middle and lower trapezezius muscle with moist heat applied to right shoulder x 20 min. Neuromuscular facilitation;Pain  muscle spasms   Manual  Therapy   Manual Therapy Soft tissue mobilization   Manual therapy comments + upper trapezius and rigth cervical spine spasms palpable    Soft tissue mobilization STM superficial and deep techniques right upper trapezius and cervical spine muscles with patient seated in chair        Patient response to treatment: able to actively cross right UE to left shoulder with greater ease of motion and decreased pain in anterior aspect of shoulder following treatment with STM and estim., improved posture noted with decreased hiking of right shoulder, patient able to tolerate isometric exercises without increased shoulder pain and verbalized understanding for home program                PT Long Term Goals - 02/15/15 1931    PT LONG TERM GOAL #1   Title Paitent will demonstrate improved function with Right UE for daily tasks with quick Dash score of 10% or less self perceived impairment by 03/15/2015   Baseline QuickDash score = 20%   Status New   PT LONG TERM GOAL #2   Title Patient will be independent with home program for self management of pain and be able to perform exercises wihtout cuing by 03/2015   Baseline limited knowledge of pain control strategies, progression of appropriate exercise   Status New               Plan - 03/09/15 1959    Clinical Impression Statement Pateint demonstrated decreased spasms and pain in right shoulder with improved lateral flexion to left and decreased hiking of right shouder following treatment. She is improving in knowledge of posture awareness for shoulder position with sitting to avoid incresaed strain on right shoulder and avoid impingment positions.    Pt will benefit from skilled therapeutic intervention in order to improve on the following deficits Decreased strength;Pain;Impaired UE functional use;Increased muscle spasms   Rehab Potential Good   PT Frequency 2x / week   PT Duration 4 weeks   PT Treatment/Interventions Manual techniques;Neuromuscular re-education;Patient/family education;Cryotherapy;Electrical Stimulation;Moist Heat;Iontophoresis 4mg /ml Dexamethasone;Therapeutic exercise;Ultrasound   PT Next Visit Plan pain control, progressive exercises to improve control/strength right UE        Problem List Patient Active Problem List   Diagnosis Date Noted  . Right anterior shoulder pain 01/25/2015  . Skin infection 12/28/2014  . Bilateral knee pain 11/11/2014  . Peripheral edema 11/11/2014  . Systolic murmur 16/06/930  . OSA (obstructive sleep apnea) 10/31/2014  . Well woman exam 10/26/2014  . Myalgia and myositis 10/26/2014  . Fatigue 10/26/2014  . Hair loss 10/26/2014  .  Nevus 10/26/2014  . Left facial pain 10/26/2014  . Morbid obesity 10/26/2014  . Somatic complaints, multiple 10/26/2014  . Insomnia 06/21/2014  . Anxiety 06/21/2014  . Spinal stenosis at L4-L5 level 06/02/2014  . Lumbar radicular pain 02/13/2012  . POLYCYSTIC OVARIAN DISEASE 06/19/2010    Jomarie Longs PT 03/09/2015, 10:26 PM  [facility] [address], [place], [address] Phone: [phone]   Fax:  [phone]

## 2015-03-10 ENCOUNTER — Encounter (HOSPITAL_BASED_OUTPATIENT_CLINIC_OR_DEPARTMENT_OTHER): Payer: BLUE CROSS/BLUE SHIELD

## 2015-03-13 ENCOUNTER — Ambulatory Visit: Payer: BLUE CROSS/BLUE SHIELD | Attending: Physician Assistant | Admitting: Physical Therapy

## 2015-03-13 ENCOUNTER — Encounter: Payer: Self-pay | Admitting: Physical Therapy

## 2015-03-13 DIAGNOSIS — M25511 Pain in right shoulder: Secondary | ICD-10-CM | POA: Diagnosis not present

## 2015-03-13 DIAGNOSIS — M6281 Muscle weakness (generalized): Secondary | ICD-10-CM

## 2015-03-13 DIAGNOSIS — M62838 Other muscle spasm: Secondary | ICD-10-CM | POA: Diagnosis present

## 2015-03-13 NOTE — Therapy (Signed)
[facility] [address], [place], [address] Phone: [phone]   Fax:  [phone]  Physical Therapy Treatment  Patient Details  Name: Danielle Irwin MRN: [id number] Date of Birth: 1971-01-24 Referring Provider:  Gerrit Halls, PA-C  Encounter Date: 03/13/2015      PT End of Session - 03/13/15 1945    Visit Number 7   Number of Visits 8   Date for PT Re-Evaluation 03/15/15   PT Start Time 1940   PT Stop Time 2025   PT Time Calculation (min) 45 min   Activity Tolerance Patient tolerated treatment well   Behavior During Therapy [facility] for tasks assessed/performed      Past Medical History  Diagnosis Date  . OA (osteoarthritis)   . PCOS (polycystic ovarian syndrome)   . Depression   . Obesity   . Vitamin D deficiency   . Family history of adverse reaction to anesthesia     FATHER WAS SLOW TO WAKE UP  . Asthma   . Headache     OCCASIONAL MIGRAINES  . HNP (herniated nucleus pulposus), lumbar   . Sensitive skin   . Insomnia   . Hx of epistaxis     Past Surgical History  Procedure Laterality Date  . Wisdom tooth extraction    . Lumbar laminectomy/decompression microdiscectomy Right 06/02/2014    Procedure: MICRO LUMBAR DECOMPRESSION L4 - L5 ON THE RIGHT 1 LEVEL;  Surgeon: Johnn Hai, MD;  Location: [facility];  Service: Orthopedics;  Laterality: Right;    There were no vitals filed for this visit.  Visit Diagnosis:  Right shoulder pain  Spasm of muscle  Muscle weakness of right upper extremity      Subjective Assessment - 03/13/15 1941    Subjective Patient reports she is improving at this time with her right shoulder. She reports her right shoulder is a little "pinchy" with raising right arm above shoulder level.   Patient Stated Goals to be able to raise arm overhead and use it without difficulty or pain   Currently in Pain? Yes   Pain Score --  mild 1-2/10   Pain Location Shoulder    Pain Orientation Right   Pain Descriptors / Indicators Aching   Pain Type Chronic pain   Pain Onset More than a month ago   Pain Frequency Intermittent           [facility] Adult PT Treatment/Exercise - 03/13/15 1953    Exercises   Exercises Other Exercises   Other Exercises  Upper trapezius stretching 3 x 20 seconds with assistance of therapist for correct alignment, re assessed scapular adduction with downward rotation with verbal cuing.    Modalities   Modalities Electrical Stimulation;Moist Probation officer Location right shoulder:  high volt estim. right shoulder anterior aspect and upper trapezius muscle x 20 min. and [place] stim. 10/10 cycle to right scapula medial aspect middle and lower trapezius muscle with moist heat applied to right shoulder x 20 min.   Electrical Stimulation Goals Neuromuscular facilitation;Pain  muscle spasms   Manual Therapy   Manual Therapy Soft tissue mobilization   Manual therapy comments + upper trapezius and rigth cervical spine spasms palpable with increased tenderness   Soft tissue mobilization STM right upper trapezius and cervical spine muscles to reduce Trp's and improved soft tissue elasticity to decreased hiking right shoulder with movement        Patient response  to treatment: able to actively raise right UE to full ROM forwardelevation with greater ease of motion and decreased pain in anterior aspect of shoulder following treatment with STM and estim., improved posture noted with decreased hiking of right shoulder, patient able to verbalize understanding for home program          PT Education - 03/13/15 1953    Education provided Yes   Education Details re assessed home program exercises for shoulder: scapular adduction    Person(s) Educated Patient   Methods Explanation;Demonstration;Verbal cues   Comprehension Verbalized understanding;Returned demonstration;Verbal cues required              PT Long Term Goals - 02/15/15 1931    PT LONG TERM GOAL #1   Title Paitent will demonstrate improved function with Right UE for daily tasks with quick Dash score of 10% or less self perceived impairment by 03/15/2015   Baseline QuickDash score = 20%   Status New   PT LONG TERM GOAL #2   Title Patient will be independent with home program for self management of pain and be able to perform exercises wihtout cuing by 03/2015   Baseline limited knowledge of pain control strategies, progression of appropriate exercise   Status New               Plan - 03/13/15 1954    Clinical Impression Statement Patient demonstrates improved ability to raise right UE overhead through full ROM with mild discomfort at end range. She is progressing well towards all goals.    Pt will benefit from skilled therapeutic intervention in order to improve on the following deficits Decreased strength;Pain;Impaired UE functional use;Increased muscle spasms   Rehab Potential Good   PT Frequency 2x / week   PT Duration 4 weeks   PT Treatment/Interventions Manual techniques;Neuromuscular re-education;Patient/family education;Cryotherapy;Electrical Stimulation;Moist Heat;Iontophoresis 4mg /ml Dexamethasone;Therapeutic exercise;Ultrasound   PT Next Visit Plan pain control, progressive exercises to improve control/strength right UE        Problem List Patient Active Problem List   Diagnosis Date Noted  . Right anterior shoulder pain 01/25/2015  . Skin infection 12/28/2014  . Bilateral knee pain 11/11/2014  . Peripheral edema 11/11/2014  . Systolic murmur 35/36/1443  . OSA (obstructive sleep apnea) 10/31/2014  . Well woman exam 10/26/2014  . Myalgia and myositis 10/26/2014  . Fatigue 10/26/2014  . Hair loss 10/26/2014  . Nevus 10/26/2014  . Left facial pain 10/26/2014  . Morbid obesity ([place]) 10/26/2014  . Somatic complaints, multiple 10/26/2014  . Insomnia 06/21/2014  . Anxiety 06/21/2014   . Spinal stenosis at L4-L5 level 06/02/2014  . Lumbar radicular pain 02/13/2012  . POLYCYSTIC OVARIAN DISEASE 06/19/2010    Jomarie Longs PT 03/13/2015, 10:22 PM  [facility] [address] [place], [address] Phone: [phone]   Fax:  [phone]

## 2015-03-15 ENCOUNTER — Ambulatory Visit: Payer: BLUE CROSS/BLUE SHIELD | Admitting: Physical Therapy

## 2015-03-15 ENCOUNTER — Encounter: Payer: Self-pay | Admitting: Physical Therapy

## 2015-03-15 DIAGNOSIS — M25511 Pain in right shoulder: Secondary | ICD-10-CM | POA: Diagnosis not present

## 2015-03-15 DIAGNOSIS — M6281 Muscle weakness (generalized): Secondary | ICD-10-CM

## 2015-03-15 NOTE — Therapy (Signed)
[place] [facility] PHYSICAL AND SPORTS MEDICINE [address] [place], [address] Phone: [phone]   Fax:  [phone]  Physical Therapy Treatment/Discharge Summary  Patient Details  Name: Danielle Irwin MRN: [id number] Date of Birth: 1971/05/18 Referring Provider:  Jodene Nam, PA-C  Encounter Date: 03/15/2015   Patient began physical therapy 02/16/2015 and attended 8 sessions through 03/15/2015 with goal of independent with home program achieved  She demonstrates good understanding of home program for pain control and home program and should continue to improve strength and function       PT End of Session - 03/15/15 2238    Visit Number 8   Number of Visits 8   Date for PT Re-Evaluation 03/15/15   PT Start Time 1805   PT Stop Time 1900   PT Time Calculation (min) 55 min   Activity Tolerance Patient tolerated treatment well   Behavior During Therapy [facility] for tasks assessed/performed      Past Medical History  Diagnosis Date  . OA (osteoarthritis)   . PCOS (polycystic ovarian syndrome)   . Depression   . Obesity   . Vitamin D deficiency   . Family history of adverse reaction to anesthesia     FATHER WAS SLOW TO WAKE UP  . Asthma   . Headache     OCCASIONAL MIGRAINES  . HNP (herniated nucleus pulposus), lumbar   . Sensitive skin   . Insomnia   . Hx of epistaxis     Past Surgical History  Procedure Laterality Date  . Wisdom tooth extraction    . Lumbar laminectomy/decompression microdiscectomy Right 06/02/2014    Procedure: MICRO LUMBAR DECOMPRESSION L4 - L5 ON THE RIGHT 1 LEVEL;  Surgeon: Javier Docker, MD;  Location: [facility];  Service: Orthopedics;  Laterality: Right;    There were no vitals filed for this visit.  Visit Diagnosis:  Right shoulder pain  Muscle weakness of right upper extremity      Subjective Assessment - 03/15/15 1805    Subjective much improved since beginnning PT. A great deal better. Patient  agrees to discharge to independent home program at this time.    Limitations Other (comment)  using right arm for lifting/overhead activities.    Patient Stated Goals to be able to raise arm overhead and use it without difficulty or pain   Currently in Pain? Yes   Pain Score --  mild pain   Pain Location Shoulder   Pain Orientation Right   Pain Descriptors / Indicators Aching   Pain Type Chronic pain   Pain Onset More than a month ago   Pain Frequency Intermittent   Multiple Pain Sites No        Objective: Outcome measures: quickDash: 25% (initially was 20%) no significant change for self perceived disability Mild + impingement tests right shoulder with crossing right UE to left shoulder, negative empty can AROM and Strength right UE strong and mild painful with right shoulder abduction, no pain with resistive ER/IR, demonstrates full forward elevation, rotations       [facility] Adult PT Treatment/Exercise - 03/15/15 1806    Exercises   Exercises Other Exercises   Other Exercises  Upper trapezius stretching 3 x 20 seconds with assistance of therapist for correct alignment, scapular adduction with downward rotation with verbal cuing. Re assessed home program to include scapular rows, lower trapezius exercise on wall, rotations with resistive band   Modalities   Modalities Electrical Stimulation;Moist Heat  Acupuncturist Location right shoulder high volt estim. right shoulder anterior aspect and upper trapezius muscle x 20 min. and [place] stim. 10/10 cycle to right scapula medial aspect middle and lower trapezius muscle with moist heat applied to right shoulder x 20 min.   Electrical Stimulation Goals Neuromuscular facilitation;Pain  muscle spasms   Manual Therapy   Manual Therapy Soft tissue mobilization   Manual therapy comments + upper trapezius and rigth cervical spine spasms palpable    Soft tissue mobilization STM right upper trapezius and  cervical spine muscles to reduce Trp's and improved soft tissue elasticity to decrease hiking right shoulder with movement        Patient response to treatment: able to actively raise right UE to full ROM forward elevation with greater ease of motion and decreased pain in anterior aspect of shoulder following treatment with STM and estim., improved posture noted with decreased hiking of right shoulder, patient able to verbalize understanding for home program and demonstrate correct posture/technique with minimal cuing         PT Education - 03/15/15 1900    Education provided Yes   Education Details re inforced home program for continued self managemnt scapular adduction, lower trapezius control, pain control   Person(s) Educated Patient   Methods Explanation;Demonstration   Comprehension Verbalized understanding;Returned demonstration             PT Long Term Goals - 02/15/15 1931    PT LONG TERM GOAL #1   Title Paitent will demonstrate improved function with Right UE for daily tasks with quick Dash score of 10% or less self perceived impairment by 03/15/2015   Baseline QuickDash score = 20%, current score 25%   Status Not met ~ same score as beginning for self perceived disability, verbalizes much improvement however   PT LONG TERM GOAL #2   Title Patient will be independent with home program for self management of pain and be able to perform exercises wihtout cuing by 03/2015   Baseline limited knowledge of pain control strategies, progression of appropriate exercise   Status Achieved               Plan - 03/15/15 1930    Clinical Impression Statement Patient has achieved goal #2, and stayed with same self perceived disability score on quickDash although verbalizes much improved and able to use right UE for all ADL's with mild to no difficulty. She is ready for discharge from physical therapy. She demonstrates good knowledge of home program to continue with exercises  to continue strengthening and self management of symptoms    Pt will benefit from skilled therapeutic intervention in order to improve on the following deficits Decreased strength;Pain;Impaired UE functional use;Increased muscle spasms   Rehab Potential Good   PT Frequency 2x / week   PT Duration 4 weeks   PT Treatment/Interventions Manual techniques;Neuromuscular re-education;Patient/family education;Cryotherapy;Electrical Stimulation;Moist Heat;Iontophoresis 4mg /ml Dexamethasone;Therapeutic exercise;Ultrasound        Problem List Patient Active Problem List   Diagnosis Date Noted  . Right anterior shoulder pain 01/25/2015  . Skin infection 12/28/2014  . Bilateral knee pain 11/11/2014  . Peripheral edema 11/11/2014  . Systolic murmur 33/35/4562  . OSA (obstructive sleep apnea) 10/31/2014  . Well woman exam 10/26/2014  . Myalgia and myositis 10/26/2014  . Fatigue 10/26/2014  . Hair loss 10/26/2014  . Nevus 10/26/2014  . Left facial pain 10/26/2014  . Morbid obesity ([place]) 10/26/2014  . Somatic complaints, multiple 10/26/2014  .  Insomnia 06/21/2014  . Anxiety 06/21/2014  . Spinal stenosis at L4-L5 level 06/02/2014  . Lumbar radicular pain 02/13/2012  . POLYCYSTIC OVARIAN DISEASE 06/19/2010    Jomarie Longs PT 03/16/2015, 7:32 PM  [place] [facility] [address] [place], [address] Phone: [phone]   Fax:  [phone]

## 2015-03-16 ENCOUNTER — Encounter: Payer: BLUE CROSS/BLUE SHIELD | Admitting: Physical Therapy

## 2015-03-17 ENCOUNTER — Ambulatory Visit (HOSPITAL_BASED_OUTPATIENT_CLINIC_OR_DEPARTMENT_OTHER): Payer: BLUE CROSS/BLUE SHIELD | Attending: Pulmonary Disease

## 2015-03-17 DIAGNOSIS — G4733 Obstructive sleep apnea (adult) (pediatric): Secondary | ICD-10-CM | POA: Diagnosis present

## 2015-03-17 DIAGNOSIS — R0683 Snoring: Secondary | ICD-10-CM | POA: Insufficient documentation

## 2015-03-20 ENCOUNTER — Encounter: Payer: BLUE CROSS/BLUE SHIELD | Admitting: Physical Therapy

## 2015-03-23 ENCOUNTER — Encounter: Payer: BLUE CROSS/BLUE SHIELD | Admitting: Physical Therapy

## 2015-03-27 ENCOUNTER — Encounter: Payer: BLUE CROSS/BLUE SHIELD | Admitting: Physical Therapy

## 2015-03-30 ENCOUNTER — Encounter: Payer: BLUE CROSS/BLUE SHIELD | Admitting: Physical Therapy

## 2015-04-03 ENCOUNTER — Encounter: Payer: BLUE CROSS/BLUE SHIELD | Admitting: Physical Therapy

## 2015-04-04 ENCOUNTER — Telehealth: Payer: Self-pay | Admitting: Pulmonary Disease

## 2015-04-04 DIAGNOSIS — G4730 Sleep apnea, unspecified: Secondary | ICD-10-CM | POA: Diagnosis not present

## 2015-04-04 DIAGNOSIS — G4733 Obstructive sleep apnea (adult) (pediatric): Secondary | ICD-10-CM

## 2015-04-04 NOTE — Telephone Encounter (Signed)
Please send prescription for- Auto CPAP therapy 8-15 cm H2O with a Small size Fisher&Paykel Full Face Mask Simplus mask and heated humidification  Download in 4 weeks Office visit with Tammy or me in 6 weeks

## 2015-04-04 NOTE — Telephone Encounter (Signed)
Patient notified. Patient scheduled for follow up appointment. Nothing further needed.

## 2015-04-04 NOTE — Telephone Encounter (Signed)
CPAP ordered. Left message for patient to call back.

## 2015-04-04 NOTE — Progress Notes (Signed)
Patient Name: Danielle Irwin, Danielle Irwin Date: 03/17/2015 Gender: Female D.O.B: 1971-05-14 Age (years): 37 Referring Provider: Kara Mead MD, ABSM Height (inches): 67 Interpreting Physician: Kara Mead MD, ABSM Weight (lbs): 346 RPSGT: Joni Reining BMI: 17 MRN: [id number] Neck Size: 14.50   CLINICAL INFORMATION The patient is referred for a CPAP titration to treat sleep apnea. Date of NPSG: 01/2015, showed mild OSA, AHI 11/hour   SLEEP STUDY TECHNIQUE As per the AASM Manual for the Scoring of Sleep and Associated Events v2.3 (April 2016) with a hypopnea requiring 4% desaturations. The channels recorded and monitored were frontal, central and occipital EEG, electrooculogram (EOG), submentalis EMG (chin), nasal and oral airflow, thoracic and abdominal wall motion, anterior tibialis EMG, snore microphone, electrocardiogram, and pulse oximetry. Continuous positive airway pressure (CPAP) was initiated at the beginning of the study and titrated to treat sleep-disordered breathing.   MEDICATIONS Medications administered by patient during sleep study : No sleep medicine administered.   TECHNICIAN COMMENTS Comments added by technician: Patient tolerated CPAP well and was able to achieve REM sleep in all positions   RESPIRATORY PARAMETERS Optimal PAP Pressure (cm): 15 AHI at Optimal Pressure (/hr): 0.0 Overall Minimal O2 (%): 89.00 Supine % at Optimal Pressure (%): 88 Minimal O2 at Optimal Pressure (%): 94.0     SLEEP ARCHITECTURE The study was initiated at 10:55:18 PM and ended at 5:00:38 AM. Sleep onset time was 36.6 minutes and the sleep efficiency was 83.1%. The total sleep time was 303.7 minutes. The patient spent 2.63% of the night in stage N1 sleep, 55.88% in stage N2 sleep, 17.45% in stage N3 and 24.04% in REM.Stage REM latency was 55.5 minutes Wake after sleep onset was 25.0. Alpha intrusion was absent. Supine sleep was 47.81%.   CARDIAC DATA The 2 lead EKG demonstrated sinus  rhythm. The mean heart rate was 71.27 beats per minute. Other EKG findings include: None.   LEG MOVEMENT DATA The total Periodic Limb Movements of Sleep (PLMS) were 4. The PLMS index was 0.79. A PLMS index of <15 is considered normal in adults.   IMPRESSIONS - The optimal PAP pressure was 15 cm of water. - Central sleep apnea was not noted during this titration (CAI = 0.0/h). - Mild oxygen desaturations were observed during this titration (min O2 = 89.00%). - The patient snored with Soft snoring volume during this titration study. - No cardiac abnormalities were observed during this study. - Clinically significant periodic limb movements were not noted during this study. Arousals associated with PLMs were rare.   DIAGNOSIS - Obstructive Sleep Apnea (327.23 [G47.33 ICD-10])   RECOMMENDATIONS - Trial of CPAP therapy on 15 cm H2O with a Small size Fisher&Paykel Full Face Mask Simplus mask and heated humidification. - Avoid alcohol, sedatives and other CNS depressants that may worsen sleep apnea and disrupt normal sleep architecture. - Sleep hygiene should be reviewed to assess factors that may improve sleep quality. - Weight management and regular exercise should be initiated or continued. - Return for re-evaluation after 4 weeks of therapy  Kara Mead MD. FCCP. [facility],Critical care and sleep medicine

## 2015-04-06 ENCOUNTER — Encounter: Payer: BLUE CROSS/BLUE SHIELD | Admitting: Physical Therapy

## 2015-05-16 ENCOUNTER — Ambulatory Visit (INDEPENDENT_AMBULATORY_CARE_PROVIDER_SITE_OTHER): Payer: BLUE CROSS/BLUE SHIELD | Admitting: Adult Health

## 2015-05-16 ENCOUNTER — Encounter: Payer: Self-pay | Admitting: Adult Health

## 2015-05-16 VITALS — BP 106/68 | HR 75 | Temp 97.5°F | Ht 66.0 in | Wt 353.0 lb

## 2015-05-16 DIAGNOSIS — G4733 Obstructive sleep apnea (adult) (pediatric): Secondary | ICD-10-CM | POA: Diagnosis not present

## 2015-05-16 NOTE — Assessment & Plan Note (Signed)
Good control on CPAP   Plan  Continue on C Pap at bedtime. Keep up the good work. Do not drive if  sleepy Work on weight loss. Follow up with Dr. Elsworth Soho in 4-6 months and as needed

## 2015-05-16 NOTE — Assessment & Plan Note (Signed)
Work on wt loss.  

## 2015-05-16 NOTE — Patient Instructions (Signed)
Continue on C Pap at bedtime. Keep up the good work. Do not drive if  sleepy Work on weight loss. Follow up with Dr. Elsworth Soho in 4-6 months and as needed

## 2015-05-16 NOTE — Progress Notes (Signed)
Subjective:    Patient ID: Danielle Irwin, female    DOB: 03/24/1971, 44 y.o.   MRN: [id number]  HPI 44 year old mortally obese female with mild sleep apnea  Test 01/2015 Sleep study : AHI 11/hr  C Pap titration 03/17/15 >Auto 8-15    05/16/2015 Follow up : OSA  Patient returns for follow-up for mild sleep apnea. Patient was seen for sleep consult earlier this year.  He was set up for a sleep study in August that showed mild sleep apnea with an AHI and 11/h She was set up for a C Pap titration study that was done 03/17/2015. He showed optimal control with an auto C Pap at 8-15 cm H2O.  Agent was started on nocturnal C Pap and says that she has improved sleep. She feels rested with no significant daytime sleepiness.. Still awakes up some during night. Takes trazadone and melatonin most night.  Download November 7 through December 5 shows excellent compliance with an average usage at around 7 hours. She is on AutoSet 8-15 cm of H2O. She has minimum weeks. AHI 0.6. She denies any chest pain, orthopnea, PND, or increased leg swelling.    Past Medical History  Diagnosis Date  . OA (osteoarthritis)   . PCOS (polycystic ovarian syndrome)   . Depression   . Obesity   . Vitamin D deficiency   . Family history of adverse reaction to anesthesia     FATHER WAS SLOW TO WAKE UP  . Asthma   . Headache     OCCASIONAL MIGRAINES  . HNP (herniated nucleus pulposus), lumbar   . Sensitive skin   . Insomnia   . Hx of epistaxis    Current Outpatient Prescriptions on File Prior to Visit  Medication Sig Dispense Refill  . acetaminophen (TYLENOL) 650 MG CR tablet Take 1,300 mg by mouth every 8 (eight) hours as needed for pain.    Marland Kitchen albuterol (VENTOLIN HFA) 108 (90 BASE) MCG/ACT inhaler Inhale 2 puffs into the lungs every 6 (six) hours as needed. (Patient taking differently: Inhale 2 puffs into the lungs every 6 (six) hours as needed for wheezing. ) 1 Inhaler 1  . Alum & Mag Hydroxide-Simeth (ANTACID  ANTI-GAS PO) Take 1 tablet by mouth daily as needed (for gas).    Marland Kitchen aspirin 81 MG tablet Take 81 mg by mouth daily.    . calcium carbonate (TUMS - DOSED IN MG ELEMENTAL CALCIUM) 500 MG chewable tablet Chew 1 tablet by mouth daily.      . calcium-vitamin D (OSCAL WITH D) 500-200 MG-UNIT per tablet Take 1 tablet by mouth daily with breakfast.    . cetirizine-pseudoephedrine (ZYRTEC-D ALLERGY & CONGESTION) 5-120 MG per tablet Take 1 tablet by mouth daily.     . chlorpheniramine-HYDROcodone (TUSSIONEX PENNKINETIC ER) 10-8 MG/5ML LQCR Take 5 mLs by mouth at bedtime as needed. 115 mL 0  . Cholecalciferol (VITAMIN D) 2000 UNITS CAPS Take 1 capsule by mouth daily.      Marland Kitchen docusate sodium (COLACE) 100 MG capsule Take 1 capsule (100 mg total) by mouth 2 (two) times daily as needed for mild constipation. 20 capsule 1  . ECHINACEA-GOLDEN SEAL PO Take 1 tablet by mouth 2 (two) times daily.    . hydrochlorothiazide (HYDRODIURIL) 25 MG tablet Take 1 tablet (25 mg total) by mouth daily. 30 tablet 11  . ibuprofen (ADVIL,MOTRIN) 800 MG tablet Take 1 tablet (800 mg total) by mouth every 8 (eight) hours as needed. 30 tablet 0  .  magnesium 30 MG tablet Take 30 mg by mouth daily.    . Melatonin 10 MG TABS Take 1 tablet by mouth at bedtime as needed (for sleep).    . methocarbamol (ROBAXIN) 500 MG tablet Take 1 tablet (500 mg total) by mouth 3 (three) times daily between meals as needed for muscle spasms. 40 tablet 1  . Multiple Vitamin (MULTIVITAMIN) tablet Take 1 tablet by mouth daily.      . mupirocin ointment (BACTROBAN) 2 % Apply 1 application topically 2 (two) times daily. To affected area 15 g 1  . norethindrone-ethinyl estradiol-iron (MICROGESTIN FE,GILDESS FE,LOESTRIN FE) 1.5-30 MG-MCG tablet Take 1 tablet by mouth daily.    . pseudoephedrine (SUDAFED) 120 MG 12 hr tablet Take 120 mg by mouth every 12 (twelve) hours as needed for congestion.    . silver sulfADIAZINE (SILVADENE) 1 % cream Apply 1 application  topically 2 (two) times daily. 50 g 0  . St Johns Wort 300 MG CAPS Take 3 capsules by mouth 2 (two) times daily.     . traMADol (ULTRAM) 50 MG tablet Take 1 tablet (50 mg total) by mouth every 12 (twelve) hours as needed for moderate pain. 30 tablet 0  . traZODone (DESYREL) 150 MG tablet Take 1 tablet (150 mg total) by mouth at bedtime. 30 tablet 3  . vitamin C (ASCORBIC ACID) 500 MG tablet Take 500 mg by mouth 2 (two) times daily.    . Zinc Sulfate (ZINC 15 PO) Take by mouth.    . oxyCODONE-acetaminophen (PERCOCET) 7.5-325 MG per tablet Take 0.5-1 tablets by mouth every 4 (four) hours as needed. 15 tablet 0  . traZODone (DESYREL) 50 MG tablet Take 0.5-1 tablets (25-50 mg total) by mouth at bedtime as needed for sleep. (Patient not taking: Reported on 05/16/2015) 30 tablet 3   No current facility-administered medications on file prior to visit.     Review of Systems Constitutional:   No  weight loss, night sweats,  Fevers, chills, fatigue, or  lassitude.  HEENT:   No headaches,  Difficulty swallowing,  Tooth/dental problems, or  Sore throat,                No sneezing, itching, ear ache, nasal congestion, post nasal drip,   CV:  No chest pain,  Orthopnea, PND, swelling in lower extremities, anasarca, dizziness, palpitations, syncope.   GI  No heartburn, indigestion, abdominal pain, nausea, vomiting, diarrhea, change in bowel habits, loss of appetite, bloody stools.   Resp: No shortness of breath with exertion or at rest.  No excess mucus, no productive cough,  No non-productive cough,  No coughing up of blood.  No change in color of mucus.  No wheezing.  No chest wall deformity  Skin: no rash or lesions.  GU: no dysuria, change in color of urine, no urgency or frequency.  No flank pain, no hematuria   MS:  No joint pain or swelling.  No decreased range of motion.  No back pain.  Psych:  No change in mood or affect. No depression or anxiety.  No memory loss.         Objective:    Physical Exam GEN: A/Ox3; pleasant , NAD, morbidly obese   HEENT:  [place]/AT,  EACs-clear, TMs-wnl, NOSE-clear, THROAT-clear, no lesions, no postnasal drip or exudate noted. MP class airway 3   NECK:  Supple w/ fair ROM; no JVD; normal carotid impulses w/o bruits; no thyromegaly or nodules palpated; no lymphadenopathy.  RESP  Clear  P & A; w/o, wheezes/ rales/ or rhonchi.no accessory muscle use, no dullness to percussion  CARD:  RRR, no m/r/g  , no peripheral edema, pulses intact, no cyanosis or clubbing.  GI:   Soft & nt; nml bowel sounds; no organomegaly or masses detected.  Musco: Warm bil, no deformities or joint swelling noted.   Neuro: alert, no focal deficits noted.    Skin: Warm, no lesions or rashes         Assessment & Plan:

## 2015-05-17 NOTE — Progress Notes (Signed)
Reviewed & agree with plan  

## 2015-05-22 ENCOUNTER — Other Ambulatory Visit: Payer: Self-pay | Admitting: Obstetrics and Gynecology

## 2015-05-22 ENCOUNTER — Other Ambulatory Visit: Payer: Self-pay | Admitting: *Deleted

## 2015-05-22 ENCOUNTER — Inpatient Hospital Stay
Admission: RE | Admit: 2015-05-22 | Discharge: 2015-05-22 | Disposition: A | Discharge status: Home / Self Care | Payer: Self-pay | Source: Ambulatory Visit | Attending: *Deleted | Admitting: *Deleted

## 2015-05-22 DIAGNOSIS — Z9289 Personal history of other medical treatment: Secondary | ICD-10-CM

## 2015-05-22 DIAGNOSIS — R928 Other abnormal and inconclusive findings on diagnostic imaging of breast: Secondary | ICD-10-CM

## 2015-05-24 ENCOUNTER — Encounter: Payer: Self-pay | Admitting: Adult Health

## 2015-06-06 ENCOUNTER — Ambulatory Visit
Admission: RE | Admit: 2015-06-06 | Discharge: 2015-06-06 | Disposition: A | Discharge status: Home / Self Care | Payer: BLUE CROSS/BLUE SHIELD | Source: Ambulatory Visit | Attending: Obstetrics and Gynecology | Admitting: Obstetrics and Gynecology

## 2015-06-06 DIAGNOSIS — R928 Other abnormal and inconclusive findings on diagnostic imaging of breast: Secondary | ICD-10-CM | POA: Insufficient documentation

## 2015-06-06 DIAGNOSIS — N63 Unspecified lump in breast: Secondary | ICD-10-CM | POA: Diagnosis not present

## 2015-06-15 ENCOUNTER — Telehealth: Payer: Self-pay | Admitting: Pulmonary Disease

## 2015-06-15 NOTE — Telephone Encounter (Signed)
Compliance Report results - 04/18/2015 - 05/17/2015  Per RA: AutoCPAP - average pressure 14cm AHI: 0.6 Very effective No Leak  Good usage Continue same.

## 2015-06-16 NOTE — Telephone Encounter (Signed)
lmtcb X1 for pt  

## 2015-06-19 NOTE — Telephone Encounter (Signed)
Left message for patient to call back  

## 2015-06-20 NOTE — Telephone Encounter (Signed)
Left message for patient to call back  

## 2015-06-21 ENCOUNTER — Encounter: Payer: Self-pay | Admitting: Pulmonary Disease

## 2015-06-21 NOTE — Telephone Encounter (Signed)
LMTCB

## 2015-06-22 NOTE — Telephone Encounter (Signed)
lmtcb

## 2015-06-26 NOTE — Telephone Encounter (Signed)
Patient has not called back after 4 attempts. Message closed.

## 2015-07-06 ENCOUNTER — Telehealth: Payer: Self-pay | Admitting: Adult Health

## 2015-07-06 NOTE — Telephone Encounter (Signed)
Compliance Report results - 04/18/2015 - 05/17/2015  Per RA: AutoCPAP - average pressure 14cm AHI: 0.6 Very effective No Leak  Good usage Continue same  Spoke with pt and notified of results per Dr. Elsworth Soho She verbalized understanding

## 2015-08-08 ENCOUNTER — Ambulatory Visit (INDEPENDENT_AMBULATORY_CARE_PROVIDER_SITE_OTHER): Payer: BLUE CROSS/BLUE SHIELD | Admitting: Internal Medicine

## 2015-08-08 ENCOUNTER — Encounter: Payer: Self-pay | Admitting: Internal Medicine

## 2015-08-08 ENCOUNTER — Other Ambulatory Visit (INDEPENDENT_AMBULATORY_CARE_PROVIDER_SITE_OTHER): Payer: BLUE CROSS/BLUE SHIELD | Admitting: *Deleted

## 2015-08-08 VITALS — BP 118/66 | HR 84 | Temp 98.4°F | Resp 12 | Wt 349.0 lb

## 2015-08-08 DIAGNOSIS — R5382 Chronic fatigue, unspecified: Secondary | ICD-10-CM

## 2015-08-08 DIAGNOSIS — R5383 Other fatigue: Secondary | ICD-10-CM | POA: Diagnosis not present

## 2015-08-08 LAB — CORTISOL
CORTISOL PLASMA: 11.5 ug/dL
Cortisol, Plasma: 18.8 ug/dL
Cortisol, Plasma: 23.1 ug/dL

## 2015-08-08 MED ORDER — COSYNTROPIN 0.25 MG IJ SOLR
0.2500 mg | Freq: Once | INTRAMUSCULAR | Status: AC
  Administered 2015-08-08: 0.25 mg via INTRAMUSCULAR

## 2015-08-08 NOTE — Patient Instructions (Signed)
Please stop at the lab. We will schedule a new appt if the labs are abnormal. 

## 2015-08-08 NOTE — Progress Notes (Signed)
Patient ID: Danielle Irwin, female   DOB: 1971-05-04, 45 y.o.   MRN: [id number]   HPI  Danielle Irwin is a 45 y.o.-year-old female, returning for f/u for fatigue. Last visit 6 mo ago.  At last visit, patient described that she started to have fatigue for several years. She felt weak and more tired when exercised. We reviewed extensive investigation by PCP (see below) with essentially negative results. However, she described that she had steroid inj in back - Fall 2015, knees - last in Spring 2016, shoulder. I suggested to stop the injections if she can and come back in 6 months to evaluate her pituitary-adrenal axis and check her for adrenal insufficiency. She presents today, off steroid injections since spring 2016. I advised her to stay away from steroid inj's.  We also discussed at length about improving her diet and trying to lose weight. I suggested a plant-based diet, which she was unable to do. However, she mentions that she started to introduce more plants in her diet since last visit.  She has a history of back surgery in 05/2014 - on Percocet - started 02/2014 - also got out of work b/c could not walk >> sciatica. Last dose of Percocet was 3-4 days ago.  Since last visit, she started a CPAP machine >> sleeping better, but still tired.   On B vitamins, but did not take them for at least 2 days.  Reviewed extensive investigation by PCP in Spring 2016 , showing: - A normal vitamin B12, of 428 (previously 237, 4 years ago) - A normal vitamin D, of 36.5 - no diabetes, hemoglobin A1c 5.3% - normal thyroid tests: Lab Results  Component Value Date   TSH 1.46 10/26/2014   TSH 2.07 08/02/2010   FREET4 0.61 10/26/2014  - no anemia, however, white blood cell count was elevated at 13.1 - ferritin was normal, at 58.1 - an ESR was high, at 40 - ANA was negative; CCP antibody <2.0, rheumatoid factor was negative - low alkaline phosphatase, with the last level improved at 38 (39-117) - normal  kidney function - Elevated ALT, at 39 (<35)  ROS: Constitutional:+ Fatigue, + fever/chills, + feeling excessively cold  Eyes:  No  blurry vision, no xerophthalmia ENT:  No  sore throat, no nodules palpated in throat, no dysphagia/odynophagia, no hoarseness Cardiovascular: no CP/SOB/palpitations/+ leg swelling Respiratory: no cough/SOB/wheezing Gastrointestinal:  No  N/V/D/C Musculoskeletal:+  muscle/ + joint aches Skin: no rashes, + hair loss Neurological: no tremors/numbness/tingling/dizziness, + HA  I reviewed pt's medications, allergies, PMH, social hx, family hx, and changes were documented in the history of present illness. Otherwise, unchanged from my initial visit note.  Past Medical History  Diagnosis Date  . OA (osteoarthritis)   . PCOS (polycystic ovarian syndrome)   . Depression   . Obesity   . Vitamin D deficiency   . Family history of adverse reaction to anesthesia     FATHER WAS SLOW TO WAKE UP  . Asthma   . Headache     OCCASIONAL MIGRAINES  . HNP (herniated nucleus pulposus), lumbar   . Sensitive skin   . Insomnia   . Hx of epistaxis    Past Surgical History  Procedure Laterality Date  . Wisdom tooth extraction    . Lumbar laminectomy/decompression microdiscectomy Right 06/02/2014    Procedure: MICRO LUMBAR DECOMPRESSION L4 - L5 ON THE RIGHT 1 LEVEL;  Surgeon: Johnn Hai, MD;  Location: [facility];  Service: Orthopedics;  Laterality: Right;  Social History   Social History  . Marital Status: Single    Spouse Name: N/A  . Number of Children: 0   Occupational History  . Retail sales   Social History Main Topics  . Smoking status: Former Smoker    Quit date: 05/26/1990  . Smokeless tobacco: Never Used  . Alcohol Use: No  . Drug Use: No   Current Outpatient Prescriptions on File Prior to Visit  Medication Sig Dispense Refill  . acetaminophen (TYLENOL) 650 MG CR tablet Take 1,300 mg by mouth every 8 (eight) hours as needed for pain.    Marland Kitchen  albuterol (VENTOLIN HFA) 108 (90 BASE) MCG/ACT inhaler Inhale 2 puffs into the lungs every 6 (six) hours as needed. (Patient taking differently: Inhale 2 puffs into the lungs every 6 (six) hours as needed for wheezing. ) 1 Inhaler 1  . Alum & Mag Hydroxide-Simeth (ANTACID ANTI-GAS PO) Take 1 tablet by mouth daily as needed (for gas).    Marland Kitchen aspirin 81 MG tablet Take 81 mg by mouth daily.    . calcium carbonate (TUMS - DOSED IN MG ELEMENTAL CALCIUM) 500 MG chewable tablet Chew 1 tablet by mouth daily.      . calcium-vitamin D (OSCAL WITH D) 500-200 MG-UNIT per tablet Take 1 tablet by mouth daily with breakfast.    . cetirizine-pseudoephedrine (ZYRTEC-D ALLERGY & CONGESTION) 5-120 MG per tablet Take 1 tablet by mouth daily.     . chlorpheniramine-HYDROcodone (TUSSIONEX PENNKINETIC ER) 10-8 MG/5ML LQCR Take 5 mLs by mouth at bedtime as needed. 115 mL 0  . Cholecalciferol (VITAMIN D) 2000 UNITS CAPS Take 1 capsule by mouth daily.      Marland Kitchen docusate sodium (COLACE) 100 MG capsule Take 1 capsule (100 mg total) by mouth 2 (two) times daily as needed for mild constipation. 20 capsule 1  . ECHINACEA-GOLDEN SEAL PO Take 1 tablet by mouth 2 (two) times daily.    . hydrochlorothiazide (HYDRODIURIL) 25 MG tablet Take 1 tablet (25 mg total) by mouth daily. 30 tablet 11  . ibuprofen (ADVIL,MOTRIN) 800 MG tablet Take 1 tablet (800 mg total) by mouth every 8 (eight) hours as needed. 30 tablet 0  . magnesium 30 MG tablet Take 30 mg by mouth daily.    . Melatonin 10 MG TABS Take 1 tablet by mouth at bedtime as needed (for sleep).    . methocarbamol (ROBAXIN) 500 MG tablet Take 1 tablet (500 mg total) by mouth 3 (three) times daily between meals as needed for muscle spasms. 40 tablet 1  . Multiple Vitamin (MULTIVITAMIN) tablet Take 1 tablet by mouth daily.      . mupirocin ointment (BACTROBAN) 2 % Apply 1 application topically 2 (two) times daily. To affected area 15 g 1  . norethindrone-ethinyl estradiol-iron (MICROGESTIN  FE,GILDESS FE,LOESTRIN FE) 1.5-30 MG-MCG tablet Take 1 tablet by mouth daily.    Marland Kitchen oxyCODONE-acetaminophen (PERCOCET) 7.5-325 MG per tablet Take 0.5-1 tablets by mouth every 4 (four) hours as needed. 15 tablet 0  . pseudoephedrine (SUDAFED) 120 MG 12 hr tablet Take 120 mg by mouth every 12 (twelve) hours as needed for congestion.    . silver sulfADIAZINE (SILVADENE) 1 % cream Apply 1 application topically 2 (two) times daily. 50 g 0  . St Johns Wort 300 MG CAPS Take 3 capsules by mouth 2 (two) times daily.     . traMADol (ULTRAM) 50 MG tablet Take 1 tablet (50 mg total) by mouth every 12 (twelve) hours as needed for  moderate pain. 30 tablet 0  . traZODone (DESYREL) 150 MG tablet Take 1 tablet (150 mg total) by mouth at bedtime. 30 tablet 3  . traZODone (DESYREL) 50 MG tablet Take 0.5-1 tablets (25-50 mg total) by mouth at bedtime as needed for sleep. (Patient not taking: Reported on 05/16/2015) 30 tablet 3  . vitamin C (ASCORBIC ACID) 500 MG tablet Take 500 mg by mouth 2 (two) times daily.    . Zinc Sulfate (ZINC 15 PO) Take by mouth.     No current facility-administered medications on file prior to visit.   Allergies  Allergen Reactions  . Bee Venom     REACTION: Hives and Swelling  . Bupropion Hcl     REACTION: Mood Changes  . Cephalexin     REACTION: Hives and Swelling  . Latex   . Neosporin [Neomycin-Bacitracin Zn-Polymyx] Hives   Family History  Problem Relation Age of Onset  . Heart attack Father 66  . Breast cancer Maternal Aunt 50   PE: BP 118/66 mmHg  Pulse 84  Temp(Src) 98.4 F (36.9 C) (Oral)  Resp 12  Wt 349 lb (158.305 kg)  SpO2 97% Body mass index is 56.36 kg/(m^2). Wt Readings from Last 3 Encounters:  08/08/15 349 lb (158.305 kg)  05/16/15 353 lb (160.12 kg)  03/17/15 346 lb (156.945 kg)   Constitutional: obesity class 3, in NAD Eyes: PERRLA, EOMI, no exophthalmos ENT: moist mucous membranes, no thyromegaly, no cervical lymphadenopathy Cardiovascular: RRR, +  +2/6 SEM, no RG Respiratory: CTA B Gastrointestinal: abdomen soft, NT, ND, BS+ Musculoskeletal: no deformities, strength intact in all 4 Skin: moist, warm, no rashes Neurological: no tremor with outstretched hands, DTR normal in all 4  ASSESSMENT: 1. Fatigue - ? AI  PLAN:  1.I  Reviewed previous  tests obtained by PCP >> essentially negative results, except a high white count. However, she does have sleep apnea, and her sleep improved after obtaining a CPAP machine. However, she still feels tired throughout the day. She did try a vegan diet, but would not follow it. Overall, she gained 12 pounds since last visit.  - At this visit, since she is at least 6 months off steroids, we can check her pituitary-adrenal axis for adrenal insufficiency. My suspicion is low, but I explained that even a normal cosyntropin stimulation test would not rule out immediate adrenal insufficiency after each injection and months of feeling poorly. I strongly advised her to avoid these injections, which she agrees with. I suggested hyaluronic acid injections if these are feasible for her. - We will schedule another appointment if the stimulation test is positive for adrenal insufficiency  Orders Placed This Encounter  Procedures  . ACTH  . Cortisol  . Cortisol  . Cortisol   Office Visit on 08/08/2015  Component Date Value Ref Range Status  . Cortisol, Plasma 08/08/2015 11.5   Final   AM:  4.3 - 22.4 ug/dLPM:  3.1 - 16.7 ug/dL  . Cortisol, Plasma 08/08/2015 18.8   Final   AM:  4.3 - 22.4 ug/dLPM:  3.1 - 16.7 ug/dL  . Cortisol, Plasma 08/08/2015 23.1   Final   AM:  4.3 - 22.4 ug/dLPM:  3.1 - 16.7 ug/dL   Stimulation test normal, ruling out adrenal insufficiency.

## 2015-08-11 LAB — ACTH: C206 ACTH: 20 pg/mL (ref 6–50)

## 2015-09-05 ENCOUNTER — Ambulatory Visit (INDEPENDENT_AMBULATORY_CARE_PROVIDER_SITE_OTHER): Payer: BLUE CROSS/BLUE SHIELD | Admitting: Pulmonary Disease

## 2015-09-05 ENCOUNTER — Encounter: Payer: Self-pay | Admitting: Pulmonary Disease

## 2015-09-05 VITALS — BP 124/86 | HR 84 | Ht 66.5 in | Wt 353.4 lb

## 2015-09-05 DIAGNOSIS — G4733 Obstructive sleep apnea (adult) (pediatric): Secondary | ICD-10-CM | POA: Diagnosis not present

## 2015-09-05 NOTE — Patient Instructions (Signed)
Try moisturizer over face May have to change FF mask If this does not work, trial of nasal mask + chin strap

## 2015-09-05 NOTE — Progress Notes (Signed)
   Subjective:    Patient ID: Danielle Irwin, female    DOB: November 22, 1970, 45 y.o.   MRN: [id number]  HPI  45 year old mortally obese female with mild sleep apnea  Test 01/2015 Sleep study : AHI 11/hr  C Pap titration 03/17/15 >Auto 8-15     She was started on nocturnal C Pap in nov 2016 and says that she has improved sleep. She feels rested with no significant daytime sleepiness.. Still awakes up some during night. Takes trazadone and melatonin at night.   C/o mark over face - has loosened straps but persists FF mask ok, pr ok, no dryness  Compliance Report- 05/2015 on AutoCPAP - average pressure 14cm, AHI: 0.6, Very effective, No Leak  Good usage  08/2015 >> good usage, avg pr 13 cm Review of Systems Patient denies significant dyspnea,cough, hemoptysis,  chest pain, palpitations, pedal edema, orthopnea, paroxysmal nocturnal dyspnea, lightheadedness, nausea, vomiting, abdominal or  leg pains      Objective:   Physical Exam   Gen. Pleasant, obese, in no distress ENT - no lesions, no post nasal drip Neck: No JVD, no thyromegaly, no carotid bruits Lungs: no use of accessory muscles, no dullness to percussion, decreased without rales or rhonchi  Cardiovascular: Rhythm regular, heart sounds  normal, no murmurs or gallops, no peripheral edema Musculoskeletal: No deformities, no cyanosis or clubbing , no tremors        Assessment & Plan:

## 2015-09-05 NOTE — Assessment & Plan Note (Signed)
Wt loss encouraged  

## 2015-09-05 NOTE — Assessment & Plan Note (Signed)
Try moisturizer over face May have to change FF mask If this does not work, trial of nasal mask + chin strap  Weight loss encouraged, compliance with goal of at least 4-6 hrs every night is the expectation. Advised against medications with sedative side effects Cautioned against driving when sleepy - understanding that sleepiness will vary on a day to day basis

## 2015-09-29 ENCOUNTER — Encounter: Payer: Self-pay | Admitting: Pulmonary Disease

## 2015-10-20 ENCOUNTER — Other Ambulatory Visit: Payer: Self-pay | Admitting: Family Medicine

## 2015-10-20 DIAGNOSIS — Z01419 Encounter for gynecological examination (general) (routine) without abnormal findings: Secondary | ICD-10-CM

## 2015-10-30 ENCOUNTER — Other Ambulatory Visit (INDEPENDENT_AMBULATORY_CARE_PROVIDER_SITE_OTHER): Payer: BLUE CROSS/BLUE SHIELD

## 2015-10-30 DIAGNOSIS — Z0000 Encounter for general adult medical examination without abnormal findings: Secondary | ICD-10-CM | POA: Diagnosis not present

## 2015-10-30 DIAGNOSIS — Z01419 Encounter for gynecological examination (general) (routine) without abnormal findings: Secondary | ICD-10-CM

## 2015-10-30 LAB — CBC WITH DIFFERENTIAL/PLATELET
BASOS PCT: 1.1 % (ref 0.0–3.0)
Basophils Absolute: 0.1 10*3/uL (ref 0.0–0.1)
EOS PCT: 1.5 % (ref 0.0–5.0)
Eosinophils Absolute: 0.1 10*3/uL (ref 0.0–0.7)
HCT: 39.2 % (ref 36.0–46.0)
HEMOGLOBIN: 13.1 g/dL (ref 12.0–15.0)
Lymphocytes Relative: 39 % (ref 12.0–46.0)
Lymphs Abs: 2.9 10*3/uL (ref 0.7–4.0)
MCHC: 33.5 g/dL (ref 30.0–36.0)
MCV: 91.3 fl (ref 78.0–100.0)
MONOS PCT: 6.3 % (ref 3.0–12.0)
Monocytes Absolute: 0.5 10*3/uL (ref 0.1–1.0)
NEUTROS PCT: 52.1 % (ref 43.0–77.0)
Neutro Abs: 3.9 10*3/uL (ref 1.4–7.7)
Platelets: 247 10*3/uL (ref 150.0–400.0)
RBC: 4.29 Mil/uL (ref 3.87–5.11)
RDW: 12.8 % (ref 11.5–15.5)
WBC: 7.5 10*3/uL (ref 4.0–10.5)

## 2015-10-30 LAB — COMPREHENSIVE METABOLIC PANEL
ALK PHOS: 31 U/L — AB (ref 39–117)
ALT: 25 U/L (ref 0–35)
AST: 23 U/L (ref 0–37)
Albumin: 4 g/dL (ref 3.5–5.2)
BILIRUBIN TOTAL: 0.5 mg/dL (ref 0.2–1.2)
BUN: 13 mg/dL (ref 6–23)
CO2: 21 mEq/L (ref 19–32)
Calcium: 9 mg/dL (ref 8.4–10.5)
Chloride: 107 mEq/L (ref 96–112)
Creatinine, Ser: 0.7 mg/dL (ref 0.40–1.20)
GFR: 96.07 mL/min (ref >60.00)
GLUCOSE: 106 mg/dL — AB (ref 70–99)
Potassium: 4 mEq/L (ref 3.5–5.1)
SODIUM: 138 meq/L (ref 135–145)
TOTAL PROTEIN: 7 g/dL (ref 6.0–8.3)

## 2015-10-30 LAB — TSH: TSH: 4.76 u[IU]/mL — AB (ref 0.35–4.50)

## 2015-10-30 LAB — VITAMIN D 25 HYDROXY (VIT D DEFICIENCY, FRACTURES): VITD: 39.1 ng/mL (ref 30.00–100.00)

## 2015-10-30 LAB — LIPID PANEL
Cholesterol: 218 mg/dL — ABNORMAL HIGH (ref 0–200)
HDL: 49.9 mg/dL (ref >39.00)
LDL Cholesterol: 146 mg/dL — ABNORMAL HIGH (ref 0–99)
NONHDL: 167.95
Total CHOL/HDL Ratio: 4
Triglycerides: 109 mg/dL (ref 0.0–149.0)
VLDL: 21.8 mg/dL (ref 0.0–40.0)

## 2015-10-31 ENCOUNTER — Encounter: Payer: BLUE CROSS/BLUE SHIELD | Admitting: Family Medicine

## 2015-11-07 ENCOUNTER — Ambulatory Visit (INDEPENDENT_AMBULATORY_CARE_PROVIDER_SITE_OTHER): Payer: BLUE CROSS/BLUE SHIELD | Admitting: Family Medicine

## 2015-11-07 ENCOUNTER — Encounter: Payer: Self-pay | Admitting: Family Medicine

## 2015-11-07 VITALS — BP 128/84 | HR 82 | Temp 98.1°F | Ht 66.5 in | Wt 350.0 lb

## 2015-11-07 DIAGNOSIS — Z0000 Encounter for general adult medical examination without abnormal findings: Secondary | ICD-10-CM

## 2015-11-07 DIAGNOSIS — M791 Myalgia: Secondary | ICD-10-CM | POA: Diagnosis not present

## 2015-11-07 DIAGNOSIS — R5383 Other fatigue: Secondary | ICD-10-CM

## 2015-11-07 DIAGNOSIS — F419 Anxiety disorder, unspecified: Secondary | ICD-10-CM

## 2015-11-07 DIAGNOSIS — M609 Myositis, unspecified: Secondary | ICD-10-CM

## 2015-11-07 DIAGNOSIS — F32A Depression, unspecified: Secondary | ICD-10-CM

## 2015-11-07 DIAGNOSIS — Z01419 Encounter for gynecological examination (general) (routine) without abnormal findings: Secondary | ICD-10-CM

## 2015-11-07 DIAGNOSIS — R946 Abnormal results of thyroid function studies: Secondary | ICD-10-CM | POA: Diagnosis not present

## 2015-11-07 DIAGNOSIS — IMO0001 Reserved for inherently not codable concepts without codable children: Secondary | ICD-10-CM

## 2015-11-07 DIAGNOSIS — F329 Major depressive disorder, single episode, unspecified: Secondary | ICD-10-CM

## 2015-11-07 DIAGNOSIS — R7989 Other specified abnormal findings of blood chemistry: Secondary | ICD-10-CM

## 2015-11-07 LAB — T4, FREE: FREE T4: 0.68 ng/dL (ref 0.60–1.60)

## 2015-11-07 LAB — TSH: TSH: 2.23 u[IU]/mL (ref 0.35–4.50)

## 2015-11-07 MED ORDER — SERTRALINE HCL 25 MG PO TABS: 25.0000 mg | ORAL_TABLET | Freq: Every day | ORAL | Status: DC

## 2015-11-07 NOTE — Assessment & Plan Note (Signed)
With persistent fatigue. Recheck thyroid panel today. Orders Placed This Encounter  Procedures  . Celiac panel 10  . TSH  . T4, Free  . Ambulatory referral to Rheumatology

## 2015-11-07 NOTE — Assessment & Plan Note (Signed)
Reviewed preventive care protocols, scheduled due services, and updated immunizations Discussed nutrition, exercise, diet, and healthy lifestyle.  

## 2015-11-07 NOTE — Assessment & Plan Note (Signed)
Persistent issues now with indigestion after eating gluten. Celiac panel. Refer to rheum- ? Fibromyalgia. The patient indicates understanding of these issues and agrees with the plan.

## 2015-11-07 NOTE — Assessment & Plan Note (Signed)
Deteriorated, along with her anxiety. Total time spent with patient was 50 minutes, with 25 minutes spent specifically on problem visit concerning depression/anxiety Greater than 50 percent of the 25 minutes was spent in counseling the Insomnia...follow this with a brief description of what was counseled.  Start zoloft 25 mg daily. She will call me in a few weeks with an update.

## 2015-11-07 NOTE — Progress Notes (Signed)
Pre visit review using our clinic review tool, if applicable. No additional management support is needed unless otherwise documented below in the visit note. 

## 2015-11-07 NOTE — Progress Notes (Signed)
Subjective:   Patient ID: KERRIA FRIEDERICHS, female    DOB: 09/06/1970, 45 y.o.   MRN: [id number]  ELSE MCGAUGHEY is a pleasant 45 y.o. year old female who presents to clinic today with Annual Exam; Anxiety; and Arthritis  on 11/07/2015  HPI:  Has GYN- Alicia Copland- per pt, neg pap and mammogram in 04/2015.  Anxiety and depression- deteriorated in past several months.  Has been resistant to starting SSRI but now she is considering this.  Previously St. Johns wart was effective. More moody. More tearful, no SI or HI.  Anxious about being around co workers at times.  Followed by Dr. Cruzita Lederer for chronic fatigue.  Felt she did not have adrenal insufficiency. TSH was elevated.   OSA- on CPAP.  Followed by Dr. Elsworth Soho. Last saw him on 09/05/15.  Note reviewed.  Last saw her in 08/08/15. Note reviewed.   Arthritis- we have tested for her rheumatological disease in past, most recently in 10/2014. + FH or RA and lupus  Has not been exercising to lose weight due to knee pain  Wt Readings from Last 3 Encounters:  11/07/15 350 lb (158.759 kg)  09/05/15 353 lb 6.4 oz (160.301 kg)  08/08/15 349 lb (158.305 kg)   Lab Results  Component Value Date   CHOL 218* 10/30/2015   HDL 49.90 10/30/2015   LDLCALC 146* 10/30/2015   LDLDIRECT 129.8 08/21/2010   TRIG 109.0 10/30/2015   CHOLHDL 4 10/30/2015   Lab Results  Component Value Date   NA 138 10/30/2015   K 4.0 10/30/2015   CL 107 10/30/2015   CO2 21 10/30/2015   Lab Results  Component Value Date   CREATININE 0.70 10/30/2015   Lab Results  Component Value Date   ALT 25 10/30/2015   AST 23 10/30/2015   ALKPHOS 31* 10/30/2015   BILITOT 0.5 10/30/2015     Review of Systems  Constitutional: Positive for fatigue. Negative for fever.  HENT: Negative for ear pain and facial swelling.   Eyes: Negative.   Respiratory: Negative.  Negative for shortness of breath.   Cardiovascular: Negative.  Negative for leg swelling.  Gastrointestinal:  Negative for abdominal pain.       +indigestion after eating grain/gluten  Endocrine: Positive for polydipsia. Negative for polyphagia.  Genitourinary: Negative.   Musculoskeletal: Positive for arthralgias. Negative for myalgias, back pain, joint swelling and neck pain.  Skin: Negative.   Neurological: Positive for headaches. Negative for seizures, syncope, speech difficulty, weakness, light-headedness and numbness.  Psychiatric/Behavioral: Positive for sleep disturbance. Negative for suicidal ideas, hallucinations, behavioral problems, confusion, dysphoric mood, decreased concentration and agitation. The patient is nervous/anxious.   All other systems reviewed and are negative.      Objective:    BP 128/84 mmHg  Pulse 82  Temp(Src) 98.1 F (36.7 C) (Oral)  Ht 5' 6.5" (1.689 m)  Wt 350 lb (158.759 kg)  BMI 55.65 kg/m2  SpO2 98%   Physical Exam  Constitutional: She is oriented to person, place, and time. She appears well-developed and well-nourished.  Morbidly obese  HENT:  Head: Normocephalic.  Eyes: Conjunctivae are normal.  Neck: Normal range of motion. Neck supple. No thyromegaly present.  Cardiovascular: Normal rate and regular rhythm.   Pulmonary/Chest: Effort normal.  Abdominal: Soft.  Musculoskeletal: Normal range of motion.  Lymphadenopathy:    She has no cervical adenopathy.  Neurological: She is alert and oriented to person, place, and time. No cranial nerve deficit.  Psychiatric: She has a  normal mood and affect. Her behavior is normal. Judgment and thought content normal.  Nursing note and vitals reviewed.         Assessment & Plan:   Well woman exam  Anxiety  Morbid obesity, unspecified obesity type ([place])  Other fatigue - Plan: Celiac panel 10, Ambulatory referral to Rheumatology  Myalgia and myositis - Plan: Ambulatory referral to Rheumatology No Follow-up on file.

## 2015-11-07 NOTE — Addendum Note (Signed)
Addended by: Marchia Bond on: 11/07/2015 02:48 PM   Modules accepted: Miquel Dunn

## 2015-11-07 NOTE — Patient Instructions (Addendum)
Good to see you. Please stop by to see Danielle Irwin on your way out- we are referring to a rheumatologist.   We are starting zoloft 25 mg daily.  Please call me in a few weeks with an update.

## 2015-11-08 ENCOUNTER — Encounter: Payer: Self-pay | Admitting: *Deleted

## 2015-11-08 LAB — CELIAC PANEL 10
Endomysial Screen: NEGATIVE
Gliadin IgA: 4 Units (ref <20)
Gliadin IgG: 3 Units (ref <20)
IgA: 175 mg/dL (ref 81–463)
TISSUE TRANSGLUT AB: 1 U/mL (ref <6)
TISSUE TRANSGLUTAMINASE AB, IGA: 1 U/mL (ref <4)

## 2015-11-09 ENCOUNTER — Encounter: Payer: Self-pay | Admitting: *Deleted

## 2015-12-04 ENCOUNTER — Other Ambulatory Visit: Payer: Self-pay | Admitting: Pulmonary Disease

## 2016-02-29 ENCOUNTER — Other Ambulatory Visit: Payer: Self-pay | Admitting: Family Medicine

## 2016-04-05 ENCOUNTER — Encounter: Payer: Self-pay | Admitting: Family Medicine

## 2016-04-05 ENCOUNTER — Ambulatory Visit (INDEPENDENT_AMBULATORY_CARE_PROVIDER_SITE_OTHER): Payer: BLUE CROSS/BLUE SHIELD | Admitting: Family Medicine

## 2016-04-05 VITALS — BP 124/76 | HR 76 | Temp 98.1°F | Wt 363.2 lb

## 2016-04-05 DIAGNOSIS — R51 Headache: Secondary | ICD-10-CM | POA: Diagnosis not present

## 2016-04-05 DIAGNOSIS — R110 Nausea: Secondary | ICD-10-CM | POA: Diagnosis not present

## 2016-04-05 DIAGNOSIS — R519 Headache, unspecified: Secondary | ICD-10-CM

## 2016-04-05 MED ORDER — ONDANSETRON 8 MG PO TBDP: 8.0000 mg | ORAL_TABLET | Freq: Three times a day (TID) | ORAL | 0 refills | Status: DC | PRN

## 2016-04-05 NOTE — Patient Instructions (Signed)
Try your robaxin and oxycodone (at bedtime) Do gentle range of motion 2-3 times a day, can apply heat first

## 2016-04-05 NOTE — Progress Notes (Signed)
Subjective:    Patient ID: Danielle Irwin, female    DOB: May 15, 1971, 45 y.o.   MRN: [id number]  HPI This is a 45 yo female who presents today with headache x 1 week. She is having constant soreness of left side of head to her ear. No known precipitating factors. Has had migraines in the past, this feels different. Pain is dull, throbbing and achy and sensitive to light and pressure. Started on a small spot and the spot has gradually gotten larger. Feels like there is a "knot." No known injury or trauma. Has tried ice pack, ibuprofen, acetaminophen and Excedrin migraine. Eases off pain, but doesn't resolve completely. Some intermittent nausea. Saw eye doctor last week. Got new contact prescription. Always has left ear pain- deep inside. No recent illness.    Past Medical History:  Diagnosis Date  . Asthma   . Depression   . Family history of adverse reaction to anesthesia    FATHER WAS SLOW TO WAKE UP  . Headache    OCCASIONAL MIGRAINES  . HNP (herniated nucleus pulposus), lumbar   . Hx of epistaxis   . Insomnia   . OA (osteoarthritis)   . Obesity   . PCOS (polycystic ovarian syndrome)   . Sensitive skin   . Vitamin D deficiency    Past Surgical History:  Procedure Laterality Date  . LUMBAR LAMINECTOMY/DECOMPRESSION MICRODISCECTOMY Right 06/02/2014   Procedure: MICRO LUMBAR DECOMPRESSION L4 - L5 ON THE RIGHT 1 LEVEL;  Surgeon: Johnn Hai, MD;  Location: [facility];  Service: Orthopedics;  Laterality: Right;  . WISDOM TOOTH EXTRACTION     Family History  Problem Relation Age of Onset  . Heart attack Father 9  . Breast cancer Maternal Aunt 3   Social History  Substance Use Topics  . Smoking status: Former Smoker    Quit date: 05/26/1990  . Smokeless tobacco: Never Used  . Alcohol use No      Review of Systems Per HPI    Objective:   Physical Exam  Constitutional: She is oriented to person, place, and time. She appears well-developed and well-nourished. No  distress.  Morbidly obese  HENT:  Head: Normocephalic and atraumatic. Head is without abrasion and without contusion. Hair is normal.    Mouth/Throat: Oropharynx is clear and moist.  Eyes: Conjunctivae and EOM are normal. Pupils are equal, round, and reactive to light. Right eye exhibits no discharge. Left eye exhibits no discharge. No scleral icterus.  Neck: Normal range of motion. Neck supple.  Cardiovascular: Normal rate, regular rhythm and normal heart sounds.   Pulmonary/Chest: Effort normal and breath sounds normal.  Musculoskeletal: Normal range of motion.  Lymphadenopathy:       Head (right side): No submental, no submandibular, no tonsillar, no preauricular, no posterior auricular and no occipital adenopathy present.       Head (left side): No submental, no submandibular, no tonsillar, no preauricular, no posterior auricular and no occipital adenopathy present.    She has no cervical adenopathy.  Neurological: She is alert and oriented to person, place, and time. She has normal reflexes. No cranial nerve deficit. Coordination normal.  Skin: Skin is warm and dry. No rash noted. She is not diaphoretic. No erythema.  Psychiatric: She has a normal mood and affect. Her behavior is normal. Judgment and thought content normal.  Vitals reviewed.     BP 124/76   Pulse 76   Temp 98.1 F (36.7 C) (Oral)   Wt (!) 363  lb 4 oz (164.8 kg)   SpO2 98%   BMI 57.75 kg/m  Wt Readings from Last 3 Encounters:  04/05/16 (!) 363 lb 4 oz (164.8 kg)  11/07/15 (!) 350 lb (158.8 kg)  09/05/15 (!) 353 lb 6.4 oz (160.3 kg)       Assessment & Plan:  1. Right-sided headache - no worrisome findings through history or physical exam, pain into neck, discussed using the robaxin and pain medication she has at home, heat, gentle ROM - RTC precautions reviewed  2. Nausea without vomiting - ondansetron (ZOFRAN-ODT) 8 MG disintegrating tablet; Take 1 tablet (8 mg total) by mouth every 8 (eight) hours as  needed for nausea.  Dispense: 16 tablet; Refill: 0   Danielle Reamer, FNP-BC  [facility]  04/07/2016 5:18 PM

## 2016-04-09 ENCOUNTER — Telehealth: Payer: Self-pay | Admitting: *Deleted

## 2016-04-09 NOTE — Telephone Encounter (Signed)
Patient left a voicemail stating that she saw Clarene Reamer last week with headaches. Patient stated that she was to call back if she was continuing to have headaches. Patients stated that she is still having headaches and has pain in the back of her head. Patient wanted to update Neoma Laming.

## 2016-04-10 NOTE — Telephone Encounter (Signed)
Attempted to call patient yesterday and today, no answer, left voice mail.  Today told her to call back to update on her condition, if she is not getting better can refer to headache clinic.

## 2016-05-06 ENCOUNTER — Ambulatory Visit (INDEPENDENT_AMBULATORY_CARE_PROVIDER_SITE_OTHER): Payer: BLUE CROSS/BLUE SHIELD | Admitting: Family Medicine

## 2016-05-06 ENCOUNTER — Encounter: Payer: Self-pay | Admitting: Family Medicine

## 2016-05-06 VITALS — BP 136/78 | HR 81 | Temp 98.0°F | Wt 360.2 lb

## 2016-05-06 DIAGNOSIS — R110 Nausea: Secondary | ICD-10-CM

## 2016-05-06 DIAGNOSIS — J069 Acute upper respiratory infection, unspecified: Secondary | ICD-10-CM

## 2016-05-06 MED ORDER — ONDANSETRON 8 MG PO TBDP: 8.0000 mg | ORAL_TABLET | Freq: Three times a day (TID) | ORAL | 0 refills | Status: DC | PRN

## 2016-05-06 MED ORDER — HYDROCOD POLST-CPM POLST ER 10-8 MG/5ML PO SUER: 5.0000 mL | Freq: Two times a day (BID) | ORAL | 0 refills | Status: DC | PRN

## 2016-05-06 MED ORDER — DOXYCYCLINE HYCLATE 100 MG PO TABS: 100.0000 mg | ORAL_TABLET | Freq: Two times a day (BID) | ORAL | 0 refills | Status: DC

## 2016-05-06 NOTE — Addendum Note (Signed)
Addended by: Lucille Passy on: 05/06/2016 10:13 AM   Modules accepted: Orders

## 2016-05-06 NOTE — Progress Notes (Signed)
SUBJECTIVE:  Danielle Irwin is a 45 y.o. female who complains of coryza, congestion and productive cough for 14 days. She denies a history of anorexia and chest pain and denies a history of asthma. Patient denies smoke cigarettes.   Current Outpatient Prescriptions on File Prior to Visit  Medication Sig Dispense Refill  . acetaminophen (TYLENOL) 650 MG CR tablet Take 1,300 mg by mouth every 8 (eight) hours as needed for pain.    Marland Kitchen albuterol (VENTOLIN HFA) 108 (90 BASE) MCG/ACT inhaler Inhale 2 puffs into the lungs every 6 (six) hours as needed. (Patient taking differently: Inhale 2 puffs into the lungs every 6 (six) hours as needed for wheezing. ) 1 Inhaler 1  . Alum & Mag Hydroxide-Simeth (ANTACID ANTI-GAS PO) Take 1 tablet by mouth daily as needed (for gas).    Marland Kitchen aspirin 81 MG tablet Take 81 mg by mouth daily.    . calcium carbonate (TUMS - DOSED IN MG ELEMENTAL CALCIUM) 500 MG chewable tablet Chew 1 tablet by mouth daily.      . calcium-vitamin D (OSCAL WITH D) 500-200 MG-UNIT per tablet Take 1 tablet by mouth daily with breakfast.    . cetirizine-pseudoephedrine (ZYRTEC-D ALLERGY & CONGESTION) 5-120 MG per tablet Take 1 tablet by mouth daily.     . Cholecalciferol (VITAMIN D) 2000 UNITS CAPS Take 1 capsule by mouth daily.      Marland Kitchen docusate sodium (COLACE) 100 MG capsule Take 1 capsule (100 mg total) by mouth 2 (two) times daily as needed for mild constipation. 20 capsule 1  . ECHINACEA-GOLDEN SEAL PO Take 1 tablet by mouth 2 (two) times daily.    . hydrochlorothiazide (HYDRODIURIL) 25 MG tablet Take 1 tablet (25 mg total) by mouth daily. 30 tablet 11  . ibuprofen (ADVIL,MOTRIN) 800 MG tablet Take 1 tablet (800 mg total) by mouth every 8 (eight) hours as needed. 30 tablet 0  . magnesium 30 MG tablet Take 30 mg by mouth daily.    . Melatonin 10 MG TABS Take 1 tablet by mouth at bedtime as needed (for sleep).    . methocarbamol (ROBAXIN) 500 MG tablet Take 1 tablet (500 mg total) by mouth 3 (three)  times daily between meals as needed for muscle spasms. 40 tablet 1  . Multiple Vitamin (MULTIVITAMIN) tablet Take 1 tablet by mouth daily.      . mupirocin ointment (BACTROBAN) 2 % Apply 1 application topically 2 (two) times daily. To affected area 15 g 1  . norethindrone-ethinyl estradiol-iron (MICROGESTIN FE,GILDESS FE,LOESTRIN FE) 1.5-30 MG-MCG tablet Take 1 tablet by mouth daily.    Marland Kitchen oxyCODONE-acetaminophen (PERCOCET) 7.5-325 MG tablet Take 1 tablet by mouth every 4 (four) hours as needed for severe pain.    . pseudoephedrine (SUDAFED) 120 MG 12 hr tablet Take 120 mg by mouth every 12 (twelve) hours as needed for congestion.    . sertraline (ZOLOFT) 25 MG tablet TAKE ONE TABLET BY MOUTH ONCE DAILY 90 tablet 1  . silver sulfADIAZINE (SILVADENE) 1 % cream Apply 1 application topically 2 (two) times daily. 50 g 0  . St Johns Wort 300 MG CAPS Take 3 capsules by mouth 2 (two) times daily.     . vitamin C (ASCORBIC ACID) 500 MG tablet Take 500 mg by mouth 2 (two) times daily.    . Zinc Sulfate (ZINC 15 PO) Take by mouth.     No current facility-administered medications on file prior to visit.     Allergies  Allergen Reactions  .  Neosporin [Neomycin-Bacitracin Zn-Polymyx] Anaphylaxis and Hives  . Bee Venom     REACTION: Hives and Swelling  . Bupropion Hcl     REACTION: Mood Changes  . Cephalexin     REACTION: Hives and Swelling  . Latex     Past Medical History:  Diagnosis Date  . Asthma   . Depression   . Family history of adverse reaction to anesthesia    FATHER WAS SLOW TO WAKE UP  . Headache    OCCASIONAL MIGRAINES  . HNP (herniated nucleus pulposus), lumbar   . Hx of epistaxis   . Insomnia   . OA (osteoarthritis)   . Obesity   . PCOS (polycystic ovarian syndrome)   . Sensitive skin   . Vitamin D deficiency     Past Surgical History:  Procedure Laterality Date  . LUMBAR LAMINECTOMY/DECOMPRESSION MICRODISCECTOMY Right 06/02/2014   Procedure: MICRO LUMBAR DECOMPRESSION  L4 - L5 ON THE RIGHT 1 LEVEL;  Surgeon: Johnn Hai, MD;  Location: [facility];  Service: Orthopedics;  Laterality: Right;  . WISDOM TOOTH EXTRACTION      Family History  Problem Relation Age of Onset  . Heart attack Father 50  . Breast cancer Maternal Aunt 50    Social History   Social History  . Marital status: Single    Spouse name: N/A  . Number of children: N/A  . Years of education: N/A   Occupational History  . Not on file.   Social History Main Topics  . Smoking status: Former Smoker    Quit date: 05/26/1990  . Smokeless tobacco: Never Used  . Alcohol use No  . Drug use: No  . Sexual activity: Not on file   Other Topics Concern  . Not on file   Social History Narrative  . No narrative on file   The PMH, PSH, Social History, Family History, Medications, and allergies have been reviewed in [facility], and have been updated if relevant.  OBJECTIVE: BP 136/78   Pulse 81   Temp 98 F (36.7 C) (Oral)   Wt (!) 360 lb 4 oz (163.4 kg)   SpO2 97%   BMI 57.27 kg/m   She appears well, vital signs are as noted. Ears normal.  Throat and pharynx normal.  Neck supple. No adenopathy in the neck. Nose is congested. Sinuses non tender. The chest is clear, without wheezes or rales.  ASSESSMENT:  sinusitis and bronchitis  PLAN: Given duration and progression of symptoms, will treat for bacterial sinusitis.  Symptomatic therapy suggested: push fluids, rest and return office visit prn if symptoms persist or worsen.Call or return to clinic prn if these symptoms worsen or fail to improve as anticipated.

## 2016-05-06 NOTE — Progress Notes (Signed)
Pre visit review using our clinic review tool, if applicable. No additional management support is needed unless otherwise documented below in the visit note. 

## 2016-09-11 ENCOUNTER — Other Ambulatory Visit: Payer: Self-pay | Admitting: Family Medicine

## 2016-12-02 ENCOUNTER — Encounter: Payer: Self-pay | Admitting: Internal Medicine

## 2016-12-02 ENCOUNTER — Ambulatory Visit (INDEPENDENT_AMBULATORY_CARE_PROVIDER_SITE_OTHER): Payer: BLUE CROSS/BLUE SHIELD | Admitting: Internal Medicine

## 2016-12-02 VITALS — BP 132/78 | HR 97 | Temp 98.3°F | Wt 359.8 lb

## 2016-12-02 DIAGNOSIS — J0100 Acute maxillary sinusitis, unspecified: Secondary | ICD-10-CM | POA: Diagnosis not present

## 2016-12-02 MED ORDER — AMOXICILLIN-POT CLAVULANATE 875-125 MG PO TABS: 1.0000 | ORAL_TABLET | Freq: Two times a day (BID) | ORAL | 0 refills | Status: DC

## 2016-12-02 NOTE — Progress Notes (Signed)
HPI  Pt presents to the clinic today with c/o runny nose, ear fullness, sore throat and cough. This started 2 weeks ago. She describes the ear pain as sharp and stabbing. She denies hearing loss. She is blowing yellow mucous out of her nose. She denies difficulty swallowing. The cough is productive of green/yellow mucous. She reports low grade fevers but denies chills or body aches. She has tried Mucinex and Delsym with some relief. She has a history of asthma. She has not had sick contacts.  Review of Systems        Past Medical History:  Diagnosis Date  . Asthma   . Depression   . Family history of adverse reaction to anesthesia    FATHER WAS SLOW TO WAKE UP  . Headache    OCCASIONAL MIGRAINES  . HNP (herniated nucleus pulposus), lumbar   . Hx of epistaxis   . Insomnia   . OA (osteoarthritis)   . Obesity   . PCOS (polycystic ovarian syndrome)   . Sensitive skin   . Vitamin D deficiency     Family History  Problem Relation Age of Onset  . Heart attack Father 41  . Breast cancer Maternal Aunt 50    Social History   Social History  . Marital status: Single    Spouse name: N/A  . Number of children: N/A  . Years of education: N/A   Occupational History  . Not on file.   Social History Main Topics  . Smoking status: Former Smoker    Quit date: 05/26/1990  . Smokeless tobacco: Never Used  . Alcohol use No  . Drug use: No  . Sexual activity: Not on file   Other Topics Concern  . Not on file   Social History Narrative  . No narrative on file    Allergies  Allergen Reactions  . Neosporin [Neomycin-Bacitracin Zn-Polymyx] Anaphylaxis and Hives  . Bee Venom     REACTION: Hives and Swelling  . Bupropion Hcl     REACTION: Mood Changes  . Cephalexin     REACTION: Hives and Swelling  . Latex      Constitutional: Positive fever. Denies headache, fatigue, abrupt weight changes.  HEENT:  Positive ear fullness, runny nose, sore throat. Denies eye redness, eye  pain, pressure behind the eyes, facial pain, nasal congestion, ear pain, ringing in the ears, wax buildup, or bloody nose. Respiratory: Positive cough. Denies difficulty breathing or shortness of breath.  Cardiovascular: Denies chest pain, chest tightness, palpitations or swelling in the hands or feet.   No other specific complaints in a complete review of systems (except as listed in HPI above).  Objective:  BP 132/78   Pulse 97   Temp 98.3 F (36.8 C) (Oral)   Wt (!) 359 lb 12 oz (163.2 kg)   SpO2 97%   BMI 57.20 kg/m   Wt Readings from Last 3 Encounters:  12/02/16 (!) 359 lb 12 oz (163.2 kg)  05/06/16 (!) 360 lb 4 oz (163.4 kg)  04/05/16 (!) 363 lb 4 oz (164.8 kg)     General: Appears her stated age, ill appearing, in NAD. HEENT: Head: normal shape and size, maxillary sinus tenderness noted;  Ears: Tm's gray and intact, normal light reflex; Nose: mucosa boggy and moist, turbinates swollen; Throat/Mouth: + PND. Teeth present, mucosa pink and moist, no exudate noted, no lesions or ulcerations noted.  Neck: No cervical lymphadenopathy.  Cardiovascular: Normal rate and rhythm.   Pulmonary/Chest: Normal effort and positive  vesicular breath sounds. No respiratory distress. No wheezes, rales or ronchi noted.       Assessment & Plan:   Acute Maxillary Sinusitis:  Get some rest and drink plenty of water Do salt water gargles for the sore throat Start Zyrtec and Flonase OTC eRx for Augmentin BID x 10 days  RTC as needed or if symptoms persist.   Webb Silversmith, NP

## 2016-12-02 NOTE — Patient Instructions (Signed)

## 2016-12-24 ENCOUNTER — Encounter: Payer: Self-pay | Admitting: Family Medicine

## 2016-12-24 ENCOUNTER — Ambulatory Visit (INDEPENDENT_AMBULATORY_CARE_PROVIDER_SITE_OTHER): Payer: BLUE CROSS/BLUE SHIELD | Admitting: Family Medicine

## 2016-12-24 VITALS — BP 140/82 | HR 79 | Ht 66.5 in | Wt 359.0 lb

## 2016-12-24 DIAGNOSIS — Z0001 Encounter for general adult medical examination with abnormal findings: Secondary | ICD-10-CM

## 2016-12-24 DIAGNOSIS — F32A Depression, unspecified: Secondary | ICD-10-CM

## 2016-12-24 DIAGNOSIS — Z01419 Encounter for gynecological examination (general) (routine) without abnormal findings: Secondary | ICD-10-CM

## 2016-12-24 DIAGNOSIS — F329 Major depressive disorder, single episode, unspecified: Secondary | ICD-10-CM | POA: Diagnosis not present

## 2016-12-24 LAB — LIPID PANEL
CHOLESTEROL: 210 mg/dL — AB (ref 0–200)
HDL: 54.9 mg/dL (ref >39.00)
LDL Cholesterol: 125 mg/dL — ABNORMAL HIGH (ref 0–99)
NonHDL: 154.78
Total CHOL/HDL Ratio: 4
Triglycerides: 150 mg/dL — ABNORMAL HIGH (ref 0.0–149.0)
VLDL: 30 mg/dL (ref 0.0–40.0)

## 2016-12-24 LAB — COMPREHENSIVE METABOLIC PANEL
ALBUMIN: 4 g/dL (ref 3.5–5.2)
ALK PHOS: 40 U/L (ref 39–117)
ALT: 30 U/L (ref 0–35)
AST: 30 U/L (ref 0–37)
BILIRUBIN TOTAL: 0.6 mg/dL (ref 0.2–1.2)
BUN: 14 mg/dL (ref 6–23)
CO2: 27 mEq/L (ref 19–32)
Calcium: 9.8 mg/dL (ref 8.4–10.5)
Chloride: 102 mEq/L (ref 96–112)
Creatinine, Ser: 0.76 mg/dL (ref 0.40–1.20)
GFR: 86.93 mL/min (ref >60.00)
Glucose, Bld: 95 mg/dL (ref 70–99)
POTASSIUM: 3.7 meq/L (ref 3.5–5.1)
Sodium: 137 mEq/L (ref 135–145)
TOTAL PROTEIN: 6.9 g/dL (ref 6.0–8.3)

## 2016-12-24 LAB — CBC WITH DIFFERENTIAL/PLATELET
Basophils Absolute: 0.1 10*3/uL (ref 0.0–0.1)
Basophils Relative: 0.9 % (ref 0.0–3.0)
EOS PCT: 1.3 % (ref 0.0–5.0)
Eosinophils Absolute: 0.1 10*3/uL (ref 0.0–0.7)
HCT: 39.4 % (ref 36.0–46.0)
HEMOGLOBIN: 13.1 g/dL (ref 12.0–15.0)
Lymphocytes Relative: 33.7 % (ref 12.0–46.0)
Lymphs Abs: 3.1 10*3/uL (ref 0.7–4.0)
MCHC: 33.3 g/dL (ref 30.0–36.0)
MCV: 92.2 fl (ref 78.0–100.0)
MONOS PCT: 6.9 % (ref 3.0–12.0)
Monocytes Absolute: 0.6 10*3/uL (ref 0.1–1.0)
Neutro Abs: 5.3 10*3/uL (ref 1.4–7.7)
Neutrophils Relative %: 57.2 % (ref 43.0–77.0)
Platelets: 268 10*3/uL (ref 150.0–400.0)
RBC: 4.28 Mil/uL (ref 3.87–5.11)
RDW: 12.9 % (ref 11.5–15.5)
WBC: 9.2 10*3/uL (ref 4.0–10.5)

## 2016-12-24 LAB — TSH: TSH: 2.3 u[IU]/mL (ref 0.35–4.50)

## 2016-12-24 LAB — HEMOGLOBIN A1C: HEMOGLOBIN A1C: 5.7 % (ref 4.6–6.5)

## 2016-12-24 MED ORDER — SERTRALINE HCL 50 MG PO TABS: 50.0000 mg | ORAL_TABLET | Freq: Every day | ORAL | 3 refills | Status: DC

## 2016-12-24 MED ORDER — HYDROCHLOROTHIAZIDE 25 MG PO TABS: 25.0000 mg | ORAL_TABLET | Freq: Every day | ORAL | 11 refills | Status: DC

## 2016-12-24 NOTE — Assessment & Plan Note (Signed)
Reviewed preventive care protocols, scheduled due services, and updated immunizations Discussed nutrition, exercise, diet, and healthy lifestyle.  Orders Placed This Encounter  Procedures  . CBC with Differential/Platelet  . Comprehensive metabolic panel  . Lipid panel  . TSH  . Hemoglobin A1c    

## 2016-12-24 NOTE — Patient Instructions (Signed)
Great to see you. We are increasing your zoloft to 50 mg daily. Please keep me updated.

## 2016-12-24 NOTE — Progress Notes (Signed)
   Subjective:   Patient ID: Danielle Irwin, female    DOB: 1970/09/06, 46 y.o.   MRN: [id number]  Danielle Irwin is a pleasant 46 y.o. year old female who presents to clinic today with Annual Exam  on 12/24/2016  HPI:  Has GYN- [facility]- per pt, neg pap and mammogram in 05/2015.  Anxiety and depression- deteriorated. Currently on zoloft 25 mg daily.  Feels zoloft has helped with anxiety but not as much with the depression.  Past few months, more anhedonia, hypersomnia.  Feels her fibromyalgia is worse.  No SI or HI.  OSA- on CPAP.  Followed by Dr. Elsworth Soho. She is compliant with CPAP.  Wt Readings from Last 3 Encounters:  12/24/16 (!) 359 lb (162.8 kg)  12/02/16 (!) 359 lb 12 oz (163.2 kg)  05/06/16 (!) 360 lb 4 oz (163.4 kg)   Lab Results  Component Value Date   CHOL 218 (H) 10/30/2015   HDL 49.90 10/30/2015   LDLCALC 146 (H) 10/30/2015   LDLDIRECT 129.8 08/21/2010   TRIG 109.0 10/30/2015   CHOLHDL 4 10/30/2015   Lab Results  Component Value Date   NA 138 10/30/2015   K 4.0 10/30/2015   CL 107 10/30/2015   CO2 21 10/30/2015   Lab Results  Component Value Date   CREATININE 0.70 10/30/2015   Lab Results  Component Value Date   ALT 25 10/30/2015   AST 23 10/30/2015   ALKPHOS 31 (L) 10/30/2015   BILITOT 0.5 10/30/2015     Review of Systems  Constitutional: Positive for fatigue. Negative for fever.  HENT: Negative for ear pain and facial swelling.   Eyes: Negative.   Respiratory: Negative.  Negative for shortness of breath.   Cardiovascular: Negative.  Negative for leg swelling.  Gastrointestinal: Negative for abdominal pain.  Endocrine: Negative for polydipsia and polyphagia.  Genitourinary: Negative.   Musculoskeletal: Negative for arthralgias, back pain, joint swelling, myalgias and neck pain.  Skin: Negative.   Neurological: Positive for headaches. Negative for seizures, syncope, speech difficulty, weakness, light-headedness and numbness.    Psychiatric/Behavioral: Positive for decreased concentration, dysphoric mood and sleep disturbance. Negative for agitation, behavioral problems, confusion, hallucinations and suicidal ideas. The patient is not nervous/anxious.   All other systems reviewed and are negative.      Objective:    BP 140/82   Pulse 79   Ht 5' 6.5" (1.689 m)   Wt (!) 359 lb (162.8 kg)   LMP 07/12/2016   SpO2 99%   BMI 57.08 kg/m    Physical Exam  Constitutional: She is oriented to person, place, and time. She appears well-developed and well-nourished.  Morbidly obese  HENT:  Head: Normocephalic.  Eyes: Conjunctivae are normal.  Neck: Normal range of motion. Neck supple. No thyromegaly present.  Cardiovascular: Normal rate and regular rhythm.   Pulmonary/Chest: Effort normal.  Abdominal: Soft.  Musculoskeletal: Normal range of motion.  Lymphadenopathy:    She has no cervical adenopathy.  Neurological: She is alert and oriented to person, place, and time. No cranial nerve deficit.  Psychiatric: She has a normal mood and affect. Her behavior is normal. Judgment and thought content normal.  Nursing note and vitals reviewed.         Assessment & Plan:   Well woman exam - Plan: CBC with Differential/Platelet, Comprehensive metabolic panel, Lipid panel, TSH  Morbid obesity ([place])  Depression, unspecified depression type No Follow-up on file.

## 2016-12-24 NOTE — Assessment & Plan Note (Signed)
Deteriorated.  Discussed tx options. She would like a trial of a higher dose of zoloft which is very appropriate- eRx sent for zoloft 50 mg daily. She will keep me updated.

## 2017-04-21 ENCOUNTER — Other Ambulatory Visit: Payer: Self-pay | Admitting: Family Medicine

## 2017-06-02 ENCOUNTER — Ambulatory Visit: Payer: Self-pay | Admitting: *Deleted

## 2017-06-02 ENCOUNTER — Ambulatory Visit (INDEPENDENT_AMBULATORY_CARE_PROVIDER_SITE_OTHER): Payer: BLUE CROSS/BLUE SHIELD | Admitting: Internal Medicine

## 2017-06-02 ENCOUNTER — Encounter: Payer: Self-pay | Admitting: Internal Medicine

## 2017-06-02 VITALS — BP 142/76 | HR 71 | Temp 98.0°F | Wt 366.2 lb

## 2017-06-02 DIAGNOSIS — R609 Edema, unspecified: Secondary | ICD-10-CM

## 2017-06-02 DIAGNOSIS — G44209 Tension-type headache, unspecified, not intractable: Secondary | ICD-10-CM | POA: Diagnosis not present

## 2017-06-02 DIAGNOSIS — I10 Essential (primary) hypertension: Secondary | ICD-10-CM | POA: Diagnosis not present

## 2017-06-02 MED ORDER — LISINOPRIL 10 MG PO TABS: 10.0000 mg | ORAL_TABLET | Freq: Every day | ORAL | 0 refills | Status: DC

## 2017-06-02 MED ORDER — KETOROLAC TROMETHAMINE 30 MG/ML IJ SOLN
30.0000 mg | Freq: Once | INTRAMUSCULAR | Status: AC
  Administered 2017-06-02: 30 mg via INTRAMUSCULAR

## 2017-06-02 NOTE — Addendum Note (Signed)
Addended by: Modena Nunnery on: 06/02/2017 10:08 AM   Modules accepted: Orders

## 2017-06-02 NOTE — Progress Notes (Signed)
Subjective:    Patient ID: Danielle Irwin, female    DOB: [birth year], 46 y.o.   MRN: [id number]  HPI  Pt presents to the clinic today with c/o elevated blood pressure. She reports she checked her blood pressure at [facility] with the automatic cuff. It was 165/94. She rechecked it 10 minutes later and it was 175/95. She reports that she has been having some headaches, and mild dizziness. The headache is located at the base of her skull and radiates up to the top of her head. She describes the pain as throbbing and pressure. She denies visual changes. She denies denies syncopal episodes, chest pain or shortness of breath. She is very stressed out. Her blood pressures here are variable, most recent 140/82. Her BP today is 142/76. She does have issues with fluid retention, and is taking HCTZ daily but does not feel like it helps.   Review of Systems  Past Medical History:  Diagnosis Date  . Asthma   . Depression   . Family history of adverse reaction to anesthesia    FATHER WAS SLOW TO WAKE UP  . Headache    OCCASIONAL MIGRAINES  . HNP (herniated nucleus pulposus), lumbar   . Hx of epistaxis   . Insomnia   . OA (osteoarthritis)   . Obesity   . PCOS (polycystic ovarian syndrome)   . Sensitive skin   . Vitamin D deficiency     Current Outpatient Medications  Medication Sig Dispense Refill  . acetaminophen (TYLENOL) 650 MG CR tablet Take 1,300 mg by mouth every 8 (eight) hours as needed for pain.    Marland Kitchen albuterol (VENTOLIN HFA) 108 (90 BASE) MCG/ACT inhaler Inhale 2 puffs into the lungs every 6 (six) hours as needed. (Patient not taking: Reported on 12/24/2016) 1 Inhaler 1  . Alum & Mag Hydroxide-Simeth (ANTACID ANTI-GAS PO) Take 1 tablet by mouth daily as needed (for gas).    Marland Kitchen aspirin 81 MG tablet Take 81 mg by mouth daily.    . calcium carbonate (TUMS - DOSED IN MG ELEMENTAL CALCIUM) 500 MG chewable tablet Chew 1 tablet by mouth daily.      . calcium-vitamin D (OSCAL WITH D) 500-200  MG-UNIT per tablet Take 1 tablet by mouth daily with breakfast.    . cetirizine-pseudoephedrine (ZYRTEC-D ALLERGY & CONGESTION) 5-120 MG per tablet Take 1 tablet by mouth daily.     . Cholecalciferol (VITAMIN D) 2000 UNITS CAPS Take 5,000 Units by mouth daily.     Marland Kitchen docusate sodium (COLACE) 100 MG capsule Take 1 capsule (100 mg total) by mouth 2 (two) times daily as needed for mild constipation. 20 capsule 1  . ECHINACEA-GOLDEN SEAL PO Take 1 tablet by mouth 2 (two) times daily.    . hydrochlorothiazide (HYDRODIURIL) 25 MG tablet Take 1 tablet (25 mg total) by mouth daily. 30 tablet 11  . ibuprofen (ADVIL,MOTRIN) 800 MG tablet Take 1 tablet (800 mg total) by mouth every 8 (eight) hours as needed. 30 tablet 0  . magnesium 30 MG tablet Take 500 mg by mouth daily.     . Melatonin 10 MG TABS Take 1 tablet by mouth at bedtime as needed (for sleep).    . methocarbamol (ROBAXIN) 500 MG tablet Take 1 tablet (500 mg total) by mouth 3 (three) times daily between meals as needed for muscle spasms. 40 tablet 1  . Multiple Vitamin (MULTIVITAMIN) tablet Take 1 tablet by mouth daily.      . mupirocin ointment (BACTROBAN)  2 % Apply 1 application topically 2 (two) times daily. To affected area 15 g 1  . norethindrone-ethinyl estradiol-iron (MICROGESTIN FE,GILDESS FE,LOESTRIN FE) 1.5-30 MG-MCG tablet Take 1 tablet by mouth daily.    . ondansetron (ZOFRAN-ODT) 8 MG disintegrating tablet Take 1 tablet (8 mg total) by mouth every 8 (eight) hours as needed for nausea. (Patient not taking: Reported on 12/24/2016) 16 tablet 0  . oxyCODONE-acetaminophen (PERCOCET) 7.5-325 MG tablet Take 1 tablet by mouth every 4 (four) hours as needed for severe pain.    . pseudoephedrine (SUDAFED) 120 MG 12 hr tablet Take 120 mg by mouth every 12 (twelve) hours as needed for congestion.    . sertraline (ZOLOFT) 50 MG tablet TAKE 1 TABLET BY MOUTH ONCE DAILY 30 tablet 3  . silver sulfADIAZINE (SILVADENE) 1 % cream Apply 1 application  topically 2 (two) times daily. 50 g 0  . St Johns Wort 300 MG CAPS Take 3 capsules by mouth 2 (two) times daily.     . vitamin C (ASCORBIC ACID) 500 MG tablet Take 500 mg by mouth 2 (two) times daily.    . Zinc Sulfate (ZINC 15 PO) Take by mouth.     No current facility-administered medications for this visit.     Allergies  Allergen Reactions  . Neosporin [Neomycin-Bacitracin Zn-Polymyx] Anaphylaxis and Hives  . Bee Venom     REACTION: Hives and Swelling  . Bupropion Hcl     REACTION: Mood Changes  . Cephalexin     REACTION: Hives and Swelling  . Latex     Family History  Problem Relation Age of Onset  . Heart attack Father 33  . Breast cancer Maternal Aunt 84    Social History   Socioeconomic History  . Marital status: Single    Spouse name: Not on file  . Number of children: Not on file  . Years of education: Not on file  . Highest education level: Not on file  Social Needs  . Financial resource strain: Not on file  . Food insecurity - worry: Not on file  . Food insecurity - inability: Not on file  . Transportation needs - medical: Not on file  . Transportation needs - non-medical: Not on file  Occupational History  . Not on file  Tobacco Use  . Smoking status: Former Smoker    Last attempt to quit: 05/26/1990    Years since quitting: 27.0  . Smokeless tobacco: Never Used  Substance and Sexual Activity  . Alcohol use: No    Alcohol/week: 0.0 oz  . Drug use: No  . Sexual activity: Not on file  Other Topics Concern  . Not on file  Social History Narrative  . Not on file     Constitutional: Pt reports headaches. Denies fever, malaise, fatigue, or abrupt weight changes.  Respiratory: Denies difficulty breathing, shortness of breath, cough or sputum production.   Cardiovascular: Denies chest pain, chest tightness, palpitations or swelling in the hands or feet.  Neurological: Pt reports dizziness. Denies difficulty with memory, difficulty with speech or  problems with balance and coordination.    No other specific complaints in a complete review of systems (except as listed in HPI above).     Objective:   Physical Exam   BP (!) 142/76   Pulse 71   Temp 98 F (36.7 C) (Oral)   Wt (!) 366 lb 4 oz (166.1 kg)   SpO2 96%   BMI 58.23 kg/m  Wt Readings from Last  3 Encounters:  06/02/17 (!) 366 lb 4 oz (166.1 kg)  12/24/16 (!) 359 lb (162.8 kg)  12/02/16 (!) 359 lb 12 oz (163.2 kg)    General: Appears her stated age, obese, in NAD. Cardiovascular: Normal rate and rhythm. S1,S2 noted.  Murmur noted. Trace BLE edema. Pulmonary/Chest: Normal effort and positive vesicular breath sounds. No respiratory distress. No wheezes, rales or ronchi noted.  Musculoskeletal: Normal flexion, extension and rotation of the cervical spine. No bony tenderness noted over the cervical spine. Neurological: Alert and oriented. Coordination normal.    BMET    Component Value Date/Time   NA 137 12/24/2016 1330   K 3.7 12/24/2016 1330   CL 102 12/24/2016 1330   CO2 27 12/24/2016 1330   GLUCOSE 95 12/24/2016 1330   BUN 14 12/24/2016 1330   CREATININE 0.76 12/24/2016 1330   CALCIUM 9.8 12/24/2016 1330   GFRNONAA 79 (L) 05/26/2014 1500   GFRAA >90 05/26/2014 1500    Lipid Panel     Component Value Date/Time   CHOL 210 (H) 12/24/2016 1330   TRIG 150.0 (H) 12/24/2016 1330   HDL 54.90 12/24/2016 1330   CHOLHDL 4 12/24/2016 1330   VLDL 30.0 12/24/2016 1330   LDLCALC 125 (H) 12/24/2016 1330    CBC    Component Value Date/Time   WBC 9.2 12/24/2016 1330   RBC 4.28 12/24/2016 1330   HGB 13.1 12/24/2016 1330   HCT 39.4 12/24/2016 1330   PLT 268.0 12/24/2016 1330   MCV 92.2 12/24/2016 1330   MCH 30.6 05/26/2014 1500   MCHC 33.3 12/24/2016 1330   RDW 12.9 12/24/2016 1330   LYMPHSABS 3.1 12/24/2016 1330   MONOABS 0.6 12/24/2016 1330   EOSABS 0.1 12/24/2016 1330   BASOSABS 0.1 12/24/2016 1330    Hgb A1C Lab Results  Component Value Date    HGBA1C 5.7 12/24/2016           Assessment & Plan:   HTN:  Discussed DASH diet and exercise for weight loss On HCTZ for edema, she feels like this is not helpful. Advised her to continue and follow up with her PCP about this eRx for Lisinopril 10 mg daily  Tension Headache:  Secondary to stress Discussed stress relieving techniques Heat and massage may be helpful Toradol 30 mg IM today  Follow up with PCP in 3 weeks for follow up HTN Aesha Agrawal, NP

## 2017-06-02 NOTE — Telephone Encounter (Signed)
  Reason for Disposition . [4] Systolic BP  >= 492 OR Diastolic >= 010 AND [0] cardiac or neurologic symptoms (e.g., chest pain, difficulty breathing, unsteady gait, blurred vision)  Answer Assessment - Initial Assessment Questions 1. BLOOD PRESSURE: "What is the blood pressure?" "Did you take at least two measurements 5 minutes apart?"   165/94 later 175/95 2. ONSET: "When did you take your blood pressure?"   Yesterday at [facility] 3. HOW: "How did you obtain the blood pressure?" (e.g., visiting nurse, automatic home BP monitor)     [employer] at [employer] 4. HISTORY: "Do you have a history of high blood pressure?"   no 5. MEDICATIONS: "Are you taking any medications for blood pressure?" "Have you missed any doses recently?"    no 6. OTHER SYMPTOMS: "Do you have any symptoms?" (e.g., headache, chest pain, blurred vision, difficulty breathing, weakness) some dizziness on standing and if I stand long periods of time. SOB at times with exertion     Headaches for 2 weeks now, dull pain and throbbing pain, at back of head and behind ears at all times of the day  7. PREGNANCY: "Is there any chance you are pregnant?" "When was your last menstrual period?"  no  Protocols used: HIGH BLOOD PRESSURE-A-AH

## 2017-06-02 NOTE — Patient Instructions (Signed)

## 2017-06-24 ENCOUNTER — Encounter: Payer: Self-pay | Admitting: Family Medicine

## 2017-06-24 ENCOUNTER — Ambulatory Visit (INDEPENDENT_AMBULATORY_CARE_PROVIDER_SITE_OTHER): Payer: BLUE CROSS/BLUE SHIELD | Admitting: Family Medicine

## 2017-06-24 VITALS — BP 126/82 | HR 99 | Temp 98.0°F | Ht 66.5 in | Wt 362.8 lb

## 2017-06-24 DIAGNOSIS — F329 Major depressive disorder, single episode, unspecified: Secondary | ICD-10-CM | POA: Diagnosis not present

## 2017-06-24 DIAGNOSIS — I10 Essential (primary) hypertension: Secondary | ICD-10-CM | POA: Insufficient documentation

## 2017-06-24 DIAGNOSIS — Z23 Encounter for immunization: Secondary | ICD-10-CM

## 2017-06-24 DIAGNOSIS — F32A Depression, unspecified: Secondary | ICD-10-CM

## 2017-06-24 DIAGNOSIS — J209 Acute bronchitis, unspecified: Secondary | ICD-10-CM

## 2017-06-24 MED ORDER — SERTRALINE HCL 100 MG PO TABS
100.0000 mg | ORAL_TABLET | Freq: Every day | ORAL | 3 refills | Status: DC
Start: 2017-06-24 — End: 2017-10-29

## 2017-06-24 MED ORDER — ALBUTEROL SULFATE HFA 108 (90 BASE) MCG/ACT IN AERS: 2.0000 | INHALATION_SPRAY | Freq: Four times a day (QID) | RESPIRATORY_TRACT | 1 refills | Status: DC | PRN

## 2017-06-24 MED ORDER — LISINOPRIL 10 MG PO TABS: 9.0000 mg | ORAL_TABLET | Freq: Every day | ORAL | 3 refills | Status: DC

## 2017-06-24 NOTE — Assessment & Plan Note (Signed)
Improved with addition of lisinopril. Continue current rxs.

## 2017-06-24 NOTE — Patient Instructions (Signed)
Great to see you.  We are increasing your zoloft to 100 mg daily.  Please keep me updated.

## 2017-06-24 NOTE — Addendum Note (Signed)
Addended by: Lucille Passy on: 06/24/2017 12:17 PM   Modules accepted: Orders

## 2017-06-24 NOTE — Progress Notes (Signed)
Subjective:   Patient ID: Danielle Irwin, female    DOB: Nov 04, 1970, 47 y.o.   MRN: [id number]  Danielle Irwin is a pleasant 47 y.o. year old female who presents to clinic today with Hypertension (Patient is here today to F/U with HTN.  She saw Webb Silversmith, NP on 12.24.18 for a right-sided H/A and ended up starting her on Lisinopril 10mg  1qd.  Not having as many periods of dizziness and lightheadedness since starting it but still have them.  Still having the H/A's though.) and Depression (Plz see PSQ-9/she is having a lot of problems with work.  They have been cutting back hours and she is having a hard time paying bills etc.)  on 06/24/2017  HPI:   HTN- saw Webb Silversmith on 06/02/17 for elevated BP. Note reviewed.  Lisinopril 10 mg daily added to her HCTZ.  Not having as much dizziness since this was started.  Still having some tension headaches.  Depression- Work has been very stressful.  Her employer is cutting back on hours and she is having a hard time paying the bills. PHQ 9 is 14 today.    Current Outpatient Medications on File Prior to Visit  Medication Sig Dispense Refill  . acetaminophen (TYLENOL) 650 MG CR tablet Take 1,300 mg by mouth every 8 (eight) hours as needed for pain.    Marland Kitchen Alum & Mag Hydroxide-Simeth (ANTACID ANTI-GAS PO) Take 1 tablet by mouth daily as needed (for gas).    Marland Kitchen aspirin 81 MG tablet Take 81 mg by mouth daily.    . calcium carbonate (TUMS - DOSED IN MG ELEMENTAL CALCIUM) 500 MG chewable tablet Chew 1 tablet by mouth daily.      . calcium-vitamin D (OSCAL WITH D) 500-200 MG-UNIT per tablet Take 1 tablet by mouth daily with breakfast.    . Cholecalciferol (VITAMIN D) 2000 UNITS CAPS Take 5,000 Units by mouth daily.     Marland Kitchen docusate sodium (COLACE) 100 MG capsule Take 1 capsule (100 mg total) by mouth 2 (two) times daily as needed for mild constipation. 20 capsule 1  . ECHINACEA-GOLDEN SEAL PO Take 1 tablet by mouth 2 (two) times daily.    . hydrochlorothiazide  (HYDRODIURIL) 25 MG tablet Take 1 tablet (25 mg total) by mouth daily. 30 tablet 11  . ibuprofen (ADVIL,MOTRIN) 800 MG tablet Take 1 tablet (800 mg total) by mouth every 8 (eight) hours as needed. 30 tablet 0  . lisinopril (PRINIVIL,ZESTRIL) 10 MG tablet Take 1 tablet (10 mg total) by mouth daily. 30 tablet 0  . magnesium 30 MG tablet Take 500 mg by mouth daily.     . Melatonin 10 MG TABS Take 1 tablet by mouth at bedtime as needed (for sleep).    . methocarbamol (ROBAXIN) 500 MG tablet Take 1 tablet (500 mg total) by mouth 3 (three) times daily between meals as needed for muscle spasms. 40 tablet 1  . Multiple Vitamin (MULTIVITAMIN) tablet Take 1 tablet by mouth daily.      . mupirocin ointment (BACTROBAN) 2 % Apply 1 application topically 2 (two) times daily. To affected area 15 g 1  . oxyCODONE-acetaminophen (PERCOCET) 7.5-325 MG tablet Take 1 tablet by mouth every 4 (four) hours as needed for severe pain.    . pseudoephedrine (SUDAFED) 120 MG 12 hr tablet Take 120 mg by mouth every 12 (twelve) hours as needed for congestion.    . sertraline (ZOLOFT) 50 MG tablet TAKE 1 TABLET BY MOUTH ONCE DAILY  30 tablet 3  . silver sulfADIAZINE (SILVADENE) 1 % cream Apply 1 application topically 2 (two) times daily. 50 g 0  . St Johns Wort 300 MG CAPS Take 3 capsules by mouth 2 (two) times daily.     . vitamin C (ASCORBIC ACID) 500 MG tablet Take 500 mg by mouth 2 (two) times daily.    . Zinc Sulfate (ZINC 15 PO) Take by mouth.    . norethindrone-ethinyl estradiol-iron (MICROGESTIN FE,GILDESS FE,LOESTRIN FE) 1.5-30 MG-MCG tablet Take 1 tablet by mouth daily.     No current facility-administered medications on file prior to visit.     Allergies  Allergen Reactions  . Benzalkonium Chloride Anaphylaxis and Hives  . Neosporin [Neomycin-Bacitracin Zn-Polymyx] Anaphylaxis and Hives  . Bee Venom     REACTION: Hives and Swelling  . Bupropion Hcl     REACTION: Mood Changes  . Cephalexin     REACTION: Hives  and Swelling  . Latex     Past Medical History:  Diagnosis Date  . Asthma   . Depression   . Family history of adverse reaction to anesthesia    FATHER WAS SLOW TO WAKE UP  . Headache    OCCASIONAL MIGRAINES  . HNP (herniated nucleus pulposus), lumbar   . Hx of epistaxis   . Insomnia   . OA (osteoarthritis)   . Obesity   . PCOS (polycystic ovarian syndrome)   . Sensitive skin   . Vitamin D deficiency     Past Surgical History:  Procedure Laterality Date  . LUMBAR LAMINECTOMY/DECOMPRESSION MICRODISCECTOMY Right 06/02/2014   Procedure: MICRO LUMBAR DECOMPRESSION L4 - L5 ON THE RIGHT 1 LEVEL;  Surgeon: Johnn Hai, MD;  Location: [facility];  Service: Orthopedics;  Laterality: Right;  . WISDOM TOOTH EXTRACTION      Family History  Problem Relation Age of Onset  . Heart attack Father 65  . Breast cancer Maternal Aunt 35    Social History   Socioeconomic History  . Marital status: Single    Spouse name: Not on file  . Number of children: Not on file  . Years of education: Not on file  . Highest education level: Not on file  Social Needs  . Financial resource strain: Not on file  . Food insecurity - worry: Not on file  . Food insecurity - inability: Not on file  . Transportation needs - medical: Not on file  . Transportation needs - non-medical: Not on file  Occupational History  . Not on file  Tobacco Use  . Smoking status: Former Smoker    Last attempt to quit: 05/26/1990    Years since quitting: 27.0  . Smokeless tobacco: Never Used  Substance and Sexual Activity  . Alcohol use: No    Alcohol/week: 0.0 oz  . Drug use: No  . Sexual activity: Not on file  Other Topics Concern  . Not on file  Social History Narrative  . Not on file   The PMH, PSH, Social History, Family History, Medications, and allergies have been reviewed in [facility], and have been updated if relevant.   Review of Systems  Constitutional: Negative.  Negative for activity change.  HENT:  Negative.   Respiratory: Negative.   Cardiovascular: Negative.   Gastrointestinal: Negative.   Musculoskeletal: Positive for back pain.  Skin: Negative.   Neurological: Positive for dizziness and headaches. Negative for tremors, seizures, syncope, facial asymmetry, speech difficulty, weakness, light-headedness and numbness.  Psychiatric/Behavioral: Positive for decreased concentration, dysphoric mood  and sleep disturbance. Negative for agitation, behavioral problems, confusion, hallucinations, self-injury and suicidal ideas. The patient is nervous/anxious. The patient is not hyperactive.   All other systems reviewed and are negative.      Objective:    BP 126/82 (BP Location: Left Arm, Patient Position: Sitting, Cuff Size: Normal)   Pulse 99   Temp 98 F (36.7 C) (Oral)   Ht 5' 6.5" (1.689 m)   Wt (!) 362 lb 12.8 oz (164.6 kg)   SpO2 97%   BMI 57.68 kg/m    Physical Exam  Constitutional: She is oriented to person, place, and time. She appears well-developed and well-nourished. No distress.  HENT:  Head: Normocephalic and atraumatic.  Eyes: Conjunctivae are normal.  Cardiovascular: Normal rate and regular rhythm.  Pulmonary/Chest: Effort normal and breath sounds normal.  Musculoskeletal: Normal range of motion. She exhibits no edema.  Neurological: She is alert and oriented to person, place, and time. No cranial nerve deficit.  Skin: Skin is warm and dry. She is not diaphoretic. No erythema.  Psychiatric: She has a normal mood and affect. Her behavior is normal. Judgment and thought content normal.  Nursing note and vitals reviewed.         Assessment & Plan:   Need for Tdap vaccination - Plan: Tdap vaccine greater than or equal to 7yo IM  Hypertension, unspecified type  Acute bronchitis - Plan: albuterol (VENTOLIN HFA) 108 (90 Base) MCG/ACT inhaler No Follow-up on file.

## 2017-06-24 NOTE — Assessment & Plan Note (Signed)
Deteriorated. Increase zoloft to 100 mg daily. She will keep me updated.

## 2017-07-30 ENCOUNTER — Other Ambulatory Visit: Payer: Self-pay

## 2017-07-30 ENCOUNTER — Observation Stay
Admission: EM | Admit: 2017-07-30 | Discharge: 2017-07-31 | Disposition: A | Discharge status: Other Acute Inpatient Hospital | Payer: BLUE CROSS/BLUE SHIELD | Attending: Internal Medicine | Admitting: Internal Medicine

## 2017-07-30 ENCOUNTER — Emergency Department: Payer: BLUE CROSS/BLUE SHIELD

## 2017-07-30 ENCOUNTER — Encounter: Payer: Self-pay | Admitting: *Deleted

## 2017-07-30 DIAGNOSIS — J45909 Unspecified asthma, uncomplicated: Secondary | ICD-10-CM | POA: Diagnosis not present

## 2017-07-30 DIAGNOSIS — M1990 Unspecified osteoarthritis, unspecified site: Secondary | ICD-10-CM | POA: Diagnosis not present

## 2017-07-30 DIAGNOSIS — Z8249 Family history of ischemic heart disease and other diseases of the circulatory system: Secondary | ICD-10-CM | POA: Diagnosis not present

## 2017-07-30 DIAGNOSIS — G4700 Insomnia, unspecified: Secondary | ICD-10-CM | POA: Insufficient documentation

## 2017-07-30 DIAGNOSIS — Z91030 Bee allergy status: Secondary | ICD-10-CM | POA: Insufficient documentation

## 2017-07-30 DIAGNOSIS — Y9301 Activity, walking, marching and hiking: Secondary | ICD-10-CM | POA: Insufficient documentation

## 2017-07-30 DIAGNOSIS — F419 Anxiety disorder, unspecified: Secondary | ICD-10-CM | POA: Diagnosis not present

## 2017-07-30 DIAGNOSIS — E559 Vitamin D deficiency, unspecified: Secondary | ICD-10-CM | POA: Diagnosis not present

## 2017-07-30 DIAGNOSIS — Z79899 Other long term (current) drug therapy: Secondary | ICD-10-CM | POA: Insufficient documentation

## 2017-07-30 DIAGNOSIS — Z888 Allergy status to other drugs, medicaments and biological substances status: Secondary | ICD-10-CM | POA: Insufficient documentation

## 2017-07-30 DIAGNOSIS — F329 Major depressive disorder, single episode, unspecified: Secondary | ICD-10-CM | POA: Diagnosis not present

## 2017-07-30 DIAGNOSIS — R52 Pain, unspecified: Secondary | ICD-10-CM | POA: Diagnosis present

## 2017-07-30 DIAGNOSIS — Z6841 Body Mass Index (BMI) 40.0 and over, adult: Secondary | ICD-10-CM | POA: Insufficient documentation

## 2017-07-30 DIAGNOSIS — Z7982 Long term (current) use of aspirin: Secondary | ICD-10-CM | POA: Diagnosis not present

## 2017-07-30 DIAGNOSIS — Z87891 Personal history of nicotine dependence: Secondary | ICD-10-CM | POA: Insufficient documentation

## 2017-07-30 DIAGNOSIS — S42352A Displaced comminuted fracture of shaft of humerus, left arm, initial encounter for closed fracture: Secondary | ICD-10-CM | POA: Diagnosis present

## 2017-07-30 DIAGNOSIS — Z881 Allergy status to other antibiotic agents status: Secondary | ICD-10-CM | POA: Diagnosis not present

## 2017-07-30 DIAGNOSIS — Z91040 Latex allergy status: Secondary | ICD-10-CM | POA: Insufficient documentation

## 2017-07-30 DIAGNOSIS — I10 Essential (primary) hypertension: Secondary | ICD-10-CM | POA: Diagnosis not present

## 2017-07-30 DIAGNOSIS — W01198A Fall on same level from slipping, tripping and stumbling with subsequent striking against other object, initial encounter: Secondary | ICD-10-CM | POA: Insufficient documentation

## 2017-07-30 MED ORDER — ONDANSETRON HCL 4 MG/2ML IJ SOLN
4.0000 mg | Freq: Once | INTRAMUSCULAR | Status: AC
  Administered 2017-07-30: 4 mg via INTRAVENOUS
  Filled 2017-07-30: qty 2

## 2017-07-30 MED ORDER — MORPHINE SULFATE (PF) 4 MG/ML IV SOLN
4.0000 mg | Freq: Once | INTRAVENOUS | Status: AC
  Administered 2017-07-30: 4 mg via INTRAVENOUS
  Filled 2017-07-30: qty 1

## 2017-07-30 MED ORDER — MORPHINE SULFATE (PF) 4 MG/ML IV SOLN
INTRAVENOUS | Status: AC
  Administered 2017-07-31: 4 mg via INTRAVENOUS
  Filled 2017-07-30: qty 1

## 2017-07-30 NOTE — ED Triage Notes (Signed)
Pt was going up the step to her neighbors and missteped and fell up hitting the door with her left shoulder and falling onto her right side

## 2017-07-30 NOTE — ED Provider Notes (Signed)
[facility] Emergency Department Provider Note    Time seen:  23:06 PM  I have reviewed the triage vital signs and the nursing notes.   HISTORY  Chief Complaint Fall    HPI Danielle Irwin is a 46 y.o. female presents to the emergency department with accidental trip and fall resultant left shoulder/proximal arm pain.  Patient states her current pain score is 10 out of 10.  Patient states she hit her toe against a step that she did not see resulting into her fall.  Patient denies any head injury no loss of consciousness.  Patient states that pain is worse with any movement of the left arm.   Past Medical History:  Diagnosis Date  . Asthma   . Depression   . Family history of adverse reaction to anesthesia    FATHER WAS SLOW TO WAKE UP  . Headache    OCCASIONAL MIGRAINES  . HNP (herniated nucleus pulposus), lumbar   . Hx of epistaxis   . Insomnia   . OA (osteoarthritis)   . Obesity   . PCOS (polycystic ovarian syndrome)   . Sensitive skin   . Vitamin D deficiency     Patient Active Problem List   Diagnosis Date Noted  . Intractable pain 07/31/2017  . HTN (hypertension) 06/24/2017  . Depression 11/07/2015  . Systolic murmur 36/64/4034  . Morbid obesity ([place]) 10/26/2014  . Insomnia 06/21/2014  . Anxiety 06/21/2014  . Spinal stenosis at L4-L5 level 06/02/2014  . POLYCYSTIC OVARIAN DISEASE 06/19/2010    Past Surgical History:  Procedure Laterality Date  . LUMBAR LAMINECTOMY/DECOMPRESSION MICRODISCECTOMY Right 06/02/2014   Procedure: MICRO LUMBAR DECOMPRESSION L4 - L5 ON THE RIGHT 1 LEVEL;  Surgeon: Johnn Hai, MD;  Location: [facility];  Service: Orthopedics;  Laterality: Right;  . WISDOM TOOTH EXTRACTION      Prior to Admission medications   Medication Sig Start Date End Date Taking? Authorizing Provider  acetaminophen (TYLENOL) 650 MG CR tablet Take 1,300 mg by mouth every 8 (eight) hours as needed for pain.   Yes [provider]  albuterol (VENTOLIN HFA) 108 (90 Base) MCG/ACT inhaler Inhale 2 puffs into the lungs every 6 (six) hours as needed. 06/24/17  Yes Lucille Passy, MD  Alum & Mag Hydroxide-Simeth (ANTACID ANTI-GAS PO) Take 1 tablet by mouth daily as needed (for gas).   Yes [provider]  aspirin 81 MG tablet Take 81 mg by mouth daily.   Yes [provider]  calcium carbonate (TUMS - DOSED IN MG ELEMENTAL CALCIUM) 500 MG chewable tablet Chew 1 tablet by mouth daily.     Yes [provider]  calcium-vitamin D (OSCAL WITH D) 500-200 MG-UNIT per tablet Take 1 tablet by mouth daily with breakfast.   Yes [provider]  Cholecalciferol (VITAMIN D) 2000 UNITS CAPS Take 5,000 Units by mouth daily.    Yes [provider]  docusate sodium (COLACE) 100 MG capsule Take 1 capsule (100 mg total) by mouth 2 (two) times daily as needed for mild constipation. 06/02/14  Yes Susa Day, MD  [facility] SEAL PO Take 1 tablet by mouth 2 (two) times daily.   Yes [provider]  hydrochlorothiazide (HYDRODIURIL) 25 MG tablet Take 1 tablet (25 mg total) by mouth daily. 12/24/16  Yes Lucille Passy, MD  ibuprofen (ADVIL,MOTRIN) 800 MG tablet Take 1 tablet (800 mg total) by mouth every 8 (eight) hours as needed. 11/23/14  Yes Alma Friendly  K, NP  lisinopril (PRINIVIL,ZESTRIL) 10 MG tablet Take 1 tablet (10 mg total) by mouth daily. 06/24/17  Yes Lucille Passy, MD  magnesium 30 MG tablet Take 500 mg by mouth daily.    Yes [provider]  Melatonin 10 MG TABS Take 1 tablet by mouth at bedtime as needed (for sleep).   Yes [provider]  methocarbamol (ROBAXIN) 500 MG tablet Take 1 tablet (500 mg total) by mouth 3 (three) times daily between meals as needed for muscle spasms. 06/02/14  Yes Susa Day, MD  Multiple Vitamin (MULTIVITAMIN) tablet Take 1 tablet by mouth daily.     Yes [provider]  mupirocin ointment (BACTROBAN) 2 % Apply 1  application topically 2 (two) times daily. To affected area 12/28/14  Yes Tower, Wynelle Fanny, MD  norethindrone-ethinyl estradiol-iron (MICROGESTIN FE,GILDESS FE,LOESTRIN FE) 1.5-30 MG-MCG tablet Take 1 tablet by mouth daily.   Yes [provider]  oxyCODONE-acetaminophen (PERCOCET) 7.5-325 MG tablet Take 1 tablet by mouth every 4 (four) hours as needed for severe pain.   Yes [provider]  pseudoephedrine (SUDAFED) 120 MG 12 hr tablet Take 120 mg by mouth every 12 (twelve) hours as needed for congestion.   Yes [provider]  sertraline (ZOLOFT) 100 MG tablet Take 1 tablet (100 mg total) by mouth daily. 06/24/17  Yes Lucille Passy, MD  silver sulfADIAZINE (SILVADENE) 1 % cream Apply 1 application topically 2 (two) times daily. 11/23/14  Yes Pleas Koch, NP  St Johns Wort 300 MG CAPS Take 3 capsules by mouth 2 (two) times daily.    Yes [provider]  vitamin C (ASCORBIC ACID) 500 MG tablet Take 500 mg by mouth 2 (two) times daily.   Yes [provider]  Zinc Sulfate (ZINC 15 PO) Take by mouth.   Yes [provider]    Allergies Benzalkonium chloride; Neosporin [neomycin-bacitracin zn-polymyx]; Bee venom; Bupropion hcl; Cephalexin; and Latex  Family History  Problem Relation Age of Onset  . Heart attack Father 104  . Breast cancer Maternal Aunt 46    Social History Social History   Tobacco Use  . Smoking status: Former Smoker    Last attempt to quit: 05/26/1990    Years since quitting: 27.2  . Smokeless tobacco: Never Used  Substance Use Topics  . Alcohol use: No    Alcohol/week: 0.0 oz  . Drug use: No    Review of Systems Constitutional: No fever/chills Eyes: No visual changes. ENT: No sore throat. Cardiovascular: Denies chest pain. Respiratory: Denies shortness of breath. Gastrointestinal: No abdominal pain.  No nausea, no vomiting.  No diarrhea.  No constipation. Genitourinary: Negative for dysuria. Musculoskeletal:  Negative for neck pain.  Negative for back pain.  Positive for left arm/shoulder pain. Integumentary: Negative for rash. Neurological: Negative for headaches, focal weakness or numbness.   ____________________________________________   PHYSICAL EXAM:  VITAL SIGNS: ED Triage Vitals  Enc Vitals Group     BP 07/30/17 2314 (!) 151/65     Pulse Rate 07/30/17 2314 80     Resp 07/30/17 2314 17     Temp 07/30/17 2314 98 F (36.7 C)     Temp src --      SpO2 07/30/17 2314 96 %     Weight 07/30/17 2315 (!) 165.6 kg (365 lb)     Height 07/30/17 2315 1.689 m (5' 6.5")     Head Circumference --      Peak Flow --  Pain Score 07/30/17 2315 10     Pain Loc --      Pain Edu? --      Excl. in [place]? --     Constitutional: Alert and oriented. Well appearing and in no acute distress. Eyes: Conjunctivae are normal. Head: Atraumatic. Mouth/Throat: Mucous membranes are moist. Oropharynx non-erythematous. Neck: No stridor.  Cardiovascular: Normal rate, regular rhythm. Good peripheral circulation. Grossly normal heart sounds. Respiratory: Normal respiratory effort.  No retractions. Lungs CTAB. Gastrointestinal: Soft and nontender. No distention.  Musculoskeletal: Pain to palpation of the left proximal arm  neurologic:  Normal speech and language. No gross focal neurologic deficits are appreciated.  Skin:  Skin is warm, dry and intact. No rash noted. Psychiatric: Mood and affect are normal. Speech and behavior are normal.   RADIOLOGY I, [place], personally viewed and evaluated these images (plain radiographs) as part of my medical decision making, as well as reviewing the written report by the radiologist.  ED MD interpretation: Comminuted left humeral head and shaft fracture with displacement  Official radiology report(s): Dg Shoulder Left  Result Date: 07/31/2017 CLINICAL DATA:  Fall onto shoulder EXAM: LEFT SHOULDER - 2+ VIEW COMPARISON:  None. FINDINGS: AC joint appears  intact. There is an acute comminuted and separated fracture involving the left humeral head, neck and proximal shaft. Superiorly positioned greater tuberosity fracture fragment. Humeral head positioned over the glenoid on Y-view but appears inferiorly positioned on the frontal view. IMPRESSION: Acute, comminuted and displaced fracture involving the left humeral head, neck and proximal shaft with inferior positioning of the left humeral head with respect to the glenoid fossa. Electronically Signed   By: Donavan Foil M.D.   On: 07/31/2017 00:12   Dg Humerus Left  Result Date: 07/31/2017 CLINICAL DATA:  [place] onto left shoulder EXAM: LEFT HUMERUS - 2+ VIEW COMPARISON:  None. FINDINGS: Comminuted and displaced proximal humerus fracture. The mid to distal humerus is intact. IMPRESSION: Acute comminuted and displaced proximal humerus fracture. Electronically Signed   By: Donavan Foil M.D.   On: 07/31/2017 00:10      Procedures   ____________________________________________   INITIAL IMPRESSION / ASSESSMENT AND PLAN / ED COURSE  As part of my medical decision making, I reviewed the following data within the electronic MEDICAL RECORD [id number] year old female present with above-stated history and physical exam secondary to fall with resultant left humeral injury.  Concern for possible fracture or dislocation.  X-ray revealed a comminuted left humeral head and shaft fracture with displacement.  Patient received multiple doses of IV morphine in addition to the 100 mcg of fentanyl that was administered by EMS.  Patient still has pain score of 9 out of 10.  As such patient discussed with Dr. Marcille Blanco for hospital admission for further evaluation and management of uncontrolled pain ____________________________________________  FINAL CLINICAL IMPRESSION(S) / ED DIAGNOSES  Final diagnoses:  Closed displaced comminuted fracture of shaft of left humerus, initial encounter     MEDICATIONS GIVEN DURING THIS  VISIT:  Medications  morphine 4 MG/ML injection 4 mg (4 mg Intravenous Given 07/30/17 2330)  ondansetron (ZOFRAN) injection 4 mg (4 mg Intravenous Given 07/30/17 2330)  morphine 4 MG/ML injection 4 mg (4 mg Intravenous Given 07/31/17 0007)     ED Discharge Orders    None       Note:  This document was prepared using Dragon voice recognition software and may include unintentional dictation errors.    Gregor Hams, MD 07/31/17 0130

## 2017-07-31 ENCOUNTER — Other Ambulatory Visit: Payer: Self-pay

## 2017-07-31 ENCOUNTER — Inpatient Hospital Stay: Admit: 2017-07-31 | Payer: Self-pay | Admitting: Internal Medicine

## 2017-07-31 DIAGNOSIS — R52 Pain, unspecified: Secondary | ICD-10-CM | POA: Diagnosis present

## 2017-07-31 MED ORDER — DOCUSATE SODIUM 100 MG PO CAPS
100.0000 mg | ORAL_CAPSULE | Freq: Two times a day (BID) | ORAL | Status: DC
  Administered 2017-07-31: 100 mg via ORAL
  Filled 2017-07-31 (×2): qty 1

## 2017-07-31 MED ORDER — HYDROMORPHONE HCL 1 MG/ML IJ SOLN
0.5000 mg | INTRAMUSCULAR | Status: DC | PRN
  Administered 2017-07-31 (×5): 0.5 mg via INTRAVENOUS
  Filled 2017-07-31 (×5): qty 1

## 2017-07-31 MED ORDER — MAGNESIUM OXIDE 400 (241.3 MG) MG PO TABS
400.0000 mg | ORAL_TABLET | Freq: Every day | ORAL | Status: DC
  Administered 2017-07-31: 400 mg via ORAL
  Filled 2017-07-31: qty 1

## 2017-07-31 MED ORDER — LISINOPRIL 5 MG PO TABS
9.0000 mg | ORAL_TABLET | Freq: Every day | ORAL | Status: DC
  Administered 2017-07-31: 21:00:00 8.75 mg via ORAL
  Filled 2017-07-31 (×2): qty 1.5

## 2017-07-31 MED ORDER — DOCUSATE SODIUM 100 MG PO CAPS: 100.0000 mg | ORAL_CAPSULE | Freq: Two times a day (BID) | ORAL | Status: DC | PRN

## 2017-07-31 MED ORDER — NORETHIN ACE-ETH ESTRAD-FE 1.5-30 MG-MCG PO TABS: 1.0000 | ORAL_TABLET | Freq: Every day | ORAL | Status: DC

## 2017-07-31 MED ORDER — MELATONIN 10 MG PO TABS: 1.0000 | ORAL_TABLET | Freq: Every evening | ORAL | Status: DC | PRN

## 2017-07-31 MED ORDER — PSEUDOEPHEDRINE HCL ER 120 MG PO TB12: 120.0000 mg | ORAL_TABLET | Freq: Two times a day (BID) | ORAL | Status: DC | PRN

## 2017-07-31 MED ORDER — IBUPROFEN 400 MG PO TABS: 800.0000 mg | ORAL_TABLET | Freq: Three times a day (TID) | ORAL | Status: DC | PRN

## 2017-07-31 MED ORDER — IBUPROFEN 400 MG PO TABS
800.0000 mg | ORAL_TABLET | Freq: Three times a day (TID) | ORAL | Status: DC | PRN
Start: 2017-07-31 — End: 2017-07-31
  Administered 2017-07-31: 800 mg via ORAL
  Filled 2017-07-31: qty 2

## 2017-07-31 MED ORDER — MORPHINE SULFATE (PF) 4 MG/ML IV SOLN
4.0000 mg | Freq: Once | INTRAVENOUS | Status: AC
  Administered 2017-07-31: 4 mg via INTRAVENOUS

## 2017-07-31 MED ORDER — MELATONIN 5 MG PO TABS
2.5000 mg | ORAL_TABLET | Freq: Every evening | ORAL | Status: DC | PRN
  Filled 2017-07-31: qty 0.5

## 2017-07-31 MED ORDER — CALCIUM CARBONATE-VITAMIN D 500-200 MG-UNIT PO TABS
1.0000 | ORAL_TABLET | Freq: Every day | ORAL | Status: DC
  Administered 2017-07-31: 11:00:00 1 via ORAL
  Filled 2017-07-31: qty 1

## 2017-07-31 MED ORDER — ZINC SULFATE 66 MG PO TABS: 1.0000 | ORAL_TABLET | Freq: Two times a day (BID) | ORAL | Status: DC

## 2017-07-31 MED ORDER — HYDROCHLOROTHIAZIDE 25 MG PO TABS
25.0000 mg | ORAL_TABLET | Freq: Every day | ORAL | Status: DC
  Filled 2017-07-31: qty 1

## 2017-07-31 MED ORDER — ENOXAPARIN SODIUM 40 MG/0.4ML ~~LOC~~ SOLN
40.0000 mg | Freq: Two times a day (BID) | SUBCUTANEOUS | Status: DC
  Administered 2017-07-31 (×2): 40 mg via SUBCUTANEOUS
  Filled 2017-07-31 (×2): qty 0.4

## 2017-07-31 MED ORDER — SILVER SULFADIAZINE 1 % EX CREA
1.0000 "application " | TOPICAL_CREAM | Freq: Two times a day (BID) | CUTANEOUS | Status: DC
  Filled 2017-07-31: qty 85

## 2017-07-31 MED ORDER — MAGNESIUM OXIDE 400 (241.3 MG) MG PO TABS: 400.0000 mg | ORAL_TABLET | Freq: Every day | ORAL | Status: DC

## 2017-07-31 MED ORDER — ST JOHNS WORT 300 MG PO CAPS: 3.0000 | ORAL_CAPSULE | Freq: Two times a day (BID) | ORAL | Status: DC

## 2017-07-31 MED ORDER — VITAMIN D 1000 UNITS PO TABS
5000.0000 [IU] | ORAL_TABLET | Freq: Every day | ORAL | Status: DC
  Administered 2017-07-31: 5000 [IU] via ORAL
  Filled 2017-07-31: qty 5

## 2017-07-31 MED ORDER — MAGNESIUM 30 MG PO TABS: 500.0000 mg | ORAL_TABLET | Freq: Every day | ORAL | Status: DC

## 2017-07-31 MED ORDER — ACETAMINOPHEN 650 MG RE SUPP: 650.0000 mg | Freq: Four times a day (QID) | RECTAL | Status: DC | PRN

## 2017-07-31 MED ORDER — VITAMIN C 500 MG PO TABS
500.0000 mg | ORAL_TABLET | Freq: Two times a day (BID) | ORAL | Status: DC
  Administered 2017-07-31 (×2): 500 mg via ORAL
  Filled 2017-07-31 (×3): qty 1

## 2017-07-31 MED ORDER — ALBUTEROL SULFATE (2.5 MG/3ML) 0.083% IN NEBU: 2.5000 mg | INHALATION_SOLUTION | RESPIRATORY_TRACT | Status: DC | PRN

## 2017-07-31 MED ORDER — MUPIROCIN 2 % EX OINT
1.0000 "application " | TOPICAL_OINTMENT | Freq: Two times a day (BID) | CUTANEOUS | Status: DC
  Filled 2017-07-31: qty 22

## 2017-07-31 MED ORDER — ADULT MULTIVITAMIN W/MINERALS CH
1.0000 | ORAL_TABLET | Freq: Every day | ORAL | Status: DC
  Administered 2017-07-31: 1 via ORAL
  Filled 2017-07-31: qty 1

## 2017-07-31 MED ORDER — METHOCARBAMOL 500 MG PO TABS
500.0000 mg | ORAL_TABLET | Freq: Three times a day (TID) | ORAL | Status: DC | PRN
  Administered 2017-07-31: 500 mg via ORAL
  Filled 2017-07-31: qty 1

## 2017-07-31 MED ORDER — ONDANSETRON HCL 4 MG PO TABS: 4.0000 mg | ORAL_TABLET | Freq: Four times a day (QID) | ORAL | Status: DC | PRN

## 2017-07-31 MED ORDER — ASPIRIN 81 MG PO CHEW
81.0000 mg | CHEWABLE_TABLET | Freq: Every day | ORAL | Status: DC
  Administered 2017-07-31: 81 mg via ORAL
  Filled 2017-07-31: qty 1

## 2017-07-31 MED ORDER — ACETAMINOPHEN 325 MG PO TABS: 650.0000 mg | ORAL_TABLET | Freq: Four times a day (QID) | ORAL | Status: DC | PRN

## 2017-07-31 MED ORDER — OXYCODONE-ACETAMINOPHEN 7.5-325 MG PO TABS
1.0000 | ORAL_TABLET | ORAL | Status: DC | PRN
  Administered 2017-07-31 (×3): 1 via ORAL
  Filled 2017-07-31 (×3): qty 1

## 2017-07-31 MED ORDER — ZINC SULFATE 220 (50 ZN) MG PO CAPS
220.0000 mg | ORAL_CAPSULE | Freq: Two times a day (BID) | ORAL | Status: DC
  Administered 2017-07-31 (×2): 220 mg via ORAL
  Filled 2017-07-31 (×2): qty 1

## 2017-07-31 MED ORDER — MAGNESIUM 30 MG PO TABS: 30.0000 mg | ORAL_TABLET | Freq: Two times a day (BID) | ORAL | Status: DC

## 2017-07-31 MED ORDER — SERTRALINE HCL 50 MG PO TABS
100.0000 mg | ORAL_TABLET | Freq: Every day | ORAL | Status: DC
  Administered 2017-07-31: 100 mg via ORAL
  Filled 2017-07-31: qty 2

## 2017-07-31 MED ORDER — ONDANSETRON HCL 4 MG/2ML IJ SOLN: 4.0000 mg | Freq: Four times a day (QID) | INTRAMUSCULAR | Status: DC | PRN

## 2017-07-31 NOTE — Consult Note (Signed)
ORTHOPAEDIC CONSULTATION  REQUESTING PHYSICIAN: Demetrios Loll, MD  Chief Complaint: Left shoulder pain  HPI: Danielle Irwin is a 47 y.o. female who complains of  Left shoulder pain after a fall late last night. Exam and x-rays in the ER showed a comminuted, displaced 4-part fx of the left proximal humerus.  She was admitted to observation for pain control without consulting Orthopaedics.  Past Medical History:  Diagnosis Date  . Asthma   . Depression   . Family history of adverse reaction to anesthesia    FATHER WAS SLOW TO WAKE UP  . Headache    OCCASIONAL MIGRAINES  . HNP (herniated nucleus pulposus), lumbar   . Hx of epistaxis   . Insomnia   . OA (osteoarthritis)   . Obesity   . PCOS (polycystic ovarian syndrome)   . Sensitive skin   . Vitamin D deficiency    Past Surgical History:  Procedure Laterality Date  . LUMBAR LAMINECTOMY/DECOMPRESSION MICRODISCECTOMY Right 06/02/2014   Procedure: MICRO LUMBAR DECOMPRESSION L4 - L5 ON THE RIGHT 1 LEVEL;  Surgeon: Johnn Hai, MD;  Location: [facility];  Service: Orthopedics;  Laterality: Right;  . WISDOM TOOTH EXTRACTION     Social History   Socioeconomic History  . Marital status: Single    Spouse name: None  . Number of children: None  . Years of education: None  . Highest education level: None  Social Needs  . Financial resource strain: None  . Food insecurity - worry: None  . Food insecurity - inability: None  . Transportation needs - medical: None  . Transportation needs - non-medical: None  Occupational History  . None  Tobacco Use  . Smoking status: Former Smoker    Last attempt to quit: 05/26/1990    Years since quitting: 27.2  . Smokeless tobacco: Never Used  Substance and Sexual Activity  . Alcohol use: No    Alcohol/week: 0.0 oz  . Drug use: No  . Sexual activity: None  Other Topics Concern  . None  Social History Narrative  . None   Family History  Problem Relation Age of Onset  . Heart attack  Father 69  . Breast cancer Maternal Aunt 50   Allergies  Allergen Reactions  . Benzalkonium Chloride Anaphylaxis and Hives  . Neosporin [Neomycin-Bacitracin Zn-Polymyx] Anaphylaxis and Hives  . Bee Venom     REACTION: Hives and Swelling  . Bupropion Hcl     REACTION: Mood Changes  . Cephalexin     REACTION: Hives and Swelling  . Latex    Prior to Admission medications   Medication Sig Start Date End Date Taking? Authorizing Provider  acetaminophen (TYLENOL) 650 MG CR tablet Take 1,300 mg by mouth every 8 (eight) hours as needed for pain.   Yes [provider]  albuterol (VENTOLIN HFA) 108 (90 Base) MCG/ACT inhaler Inhale 2 puffs into the lungs every 6 (six) hours as needed. 06/24/17  Yes Lucille Passy, MD  Alum & Mag Hydroxide-Simeth (ANTACID ANTI-GAS PO) Take 1 tablet by mouth daily as needed (for gas).   Yes [provider]  aspirin 81 MG tablet Take 81 mg by mouth daily.   Yes [provider]  calcium carbonate (TUMS - DOSED IN MG ELEMENTAL CALCIUM) 500 MG chewable tablet Chew 1 tablet by mouth daily.     Yes [provider]  calcium-vitamin D (OSCAL WITH D) 500-200 MG-UNIT per tablet Take 1 tablet by mouth daily with breakfast.   Yes [provider]  Cholecalciferol (VITAMIN D) 2000 UNITS CAPS Take 5,000 Units by mouth daily.    Yes [provider]  docusate sodium (COLACE) 100 MG capsule Take 1 capsule (100 mg total) by mouth 2 (two) times daily as needed for mild constipation. 06/02/14  Yes Susa Day, MD  [facility] SEAL PO Take 1 tablet by mouth 2 (two) times daily.   Yes [provider]  hydrochlorothiazide (HYDRODIURIL) 25 MG tablet Take 1 tablet (25 mg total) by mouth daily. 12/24/16  Yes Lucille Passy, MD  ibuprofen (ADVIL,MOTRIN) 800 MG tablet Take 1 tablet (800 mg total) by mouth every 8 (eight) hours as needed. 11/23/14  Yes Pleas Koch, NP  lisinopril (PRINIVIL,ZESTRIL) 10 MG tablet Take 1 tablet  (10 mg total) by mouth daily. 06/24/17  Yes Lucille Passy, MD  magnesium 30 MG tablet Take 500 mg by mouth daily.    Yes [provider]  Melatonin 10 MG TABS Take 1 tablet by mouth at bedtime as needed (for sleep).   Yes [provider]  methocarbamol (ROBAXIN) 500 MG tablet Take 1 tablet (500 mg total) by mouth 3 (three) times daily between meals as needed for muscle spasms. 06/02/14  Yes Susa Day, MD  Multiple Vitamin (MULTIVITAMIN) tablet Take 1 tablet by mouth daily.     Yes [provider]  mupirocin ointment (BACTROBAN) 2 % Apply 1 application topically 2 (two) times daily. To affected area 12/28/14  Yes Tower, Wynelle Fanny, MD  norethindrone-ethinyl estradiol-iron (MICROGESTIN FE,GILDESS FE,LOESTRIN FE) 1.5-30 MG-MCG tablet Take 1 tablet by mouth daily.   Yes [provider]  oxyCODONE-acetaminophen (PERCOCET) 7.5-325 MG tablet Take 1 tablet by mouth every 4 (four) hours as needed for severe pain.   Yes [provider]  pseudoephedrine (SUDAFED) 120 MG 12 hr tablet Take 120 mg by mouth every 12 (twelve) hours as needed for congestion.   Yes [provider]  sertraline (ZOLOFT) 100 MG tablet Take 1 tablet (100 mg total) by mouth daily. 06/24/17  Yes Lucille Passy, MD  silver sulfADIAZINE (SILVADENE) 1 % cream Apply 1 application topically 2 (two) times daily. 11/23/14  Yes Pleas Koch, NP  [place] Johns Wort 300 MG CAPS Take 3 capsules by mouth 2 (two) times daily.    Yes [provider]  vitamin C (ASCORBIC ACID) 500 MG tablet Take 500 mg by mouth 2 (two) times daily.   Yes [provider]  Zinc Sulfate (ZINC 15 PO) Take by mouth.   Yes [provider]   Dg Shoulder Left  Result Date: 07/31/2017 CLINICAL DATA:  Fall onto shoulder EXAM: LEFT SHOULDER - 2+ VIEW COMPARISON:  None. FINDINGS: AC joint appears intact. There is an acute comminuted and separated fracture involving the left humeral head, neck and proximal  shaft. Superiorly positioned greater tuberosity fracture fragment. Humeral head positioned over the glenoid on Y-view but appears inferiorly positioned on the frontal view. IMPRESSION: Acute, comminuted and displaced fracture involving the left humeral head, neck and proximal shaft with inferior positioning of the left humeral head with respect to the glenoid fossa. Electronically Signed   By: Donavan Foil M.D.   On: 07/31/2017 00:12   Dg Humerus Left  Result Date: 07/31/2017 CLINICAL DATA:  [place] onto left shoulder EXAM: LEFT HUMERUS - 2+ VIEW COMPARISON:  None. FINDINGS: Comminuted and displaced proximal humerus fracture. The mid to distal humerus is intact. IMPRESSION: Acute comminuted and displaced proximal humerus fracture. Electronically Signed  By: Donavan Foil M.D.   On: 07/31/2017 00:10    Positive ROS: All other systems have been reviewed and were otherwise negative with the exception of those mentioned in the HPI and as above.  Physical Exam: General: Alert, no acute distress Cardiovascular: No pedal edema Respiratory: No cyanosis, no use of accessory musculature GI: No organomegaly, abdomen is soft and non-tender Skin: No lesions in the area of chief complaint Neurologic: Sensation intact distally Psychiatric: Patient is competent for consent with normal mood and affect Lymphatic: No axillary or cervical lymphadenopathy  MUSCULOSKELETAL: Very pleasant but obese female in moderate pain.  Sling applied.  Tender left shoulder and unable to move it.  CSM good distally. Skin intact.  Abrasions on right lower leg are superficial.  No other injuries noted.  Assessment: Markedly displaced left proximal humerus fracture in morbidly obese young female.   Plan: I advised the patient that the type and scope of surgery she needs to have this fixed is beyond my capabilities as a Psychologist, sport and exercise.  I have consulted Dr Altamese Osmond, trauma surgeon in [place] about this case and he and his partner  Dr Lennette Bihari Haddix have  agreed to accept her for possible surgery.  I have discussed this with the patient and she is agreeable to this approach. Dr Jannifer Franklin, the [facility] hospitalist will facilitate the transfer tonight.  She has been advised not to eat or drink anything after midnight.      Park Breed, MD [phone]   07/31/2017 9:46 PM

## 2017-07-31 NOTE — H&P (Addendum)
Danielle Irwin is an 47 y.o. female.   Chief Complaint: Fall HPI: The patient with past medical history of asthma and chronic back pain presents to the emergency department after a fall.  The patient tripped while walking upstairs and struck her left shoulder on a door frame.  X-ray in the emergency department revealed a comminuted fracture of the left upper humerus.  The patient was placed in a sling and received analgesia but could not bear the pain of her injury which prompted the emergency department staff to call the hospitalist service for further management.  Past Medical History:  Diagnosis Date  . Asthma   . Depression   . Family history of adverse reaction to anesthesia    FATHER WAS SLOW TO WAKE UP  . Headache    OCCASIONAL MIGRAINES  . HNP (herniated nucleus pulposus), lumbar   . Hx of epistaxis   . Insomnia   . OA (osteoarthritis)   . Obesity   . PCOS (polycystic ovarian syndrome)   . Sensitive skin   . Vitamin D deficiency     Past Surgical History:  Procedure Laterality Date  . LUMBAR LAMINECTOMY/DECOMPRESSION MICRODISCECTOMY Right 06/02/2014   Procedure: MICRO LUMBAR DECOMPRESSION L4 - L5 ON THE RIGHT 1 LEVEL;  Surgeon: Johnn Hai, MD;  Location: [facility];  Service: Orthopedics;  Laterality: Right;  . WISDOM TOOTH EXTRACTION      Family History  Problem Relation Age of Onset  . Heart attack Father 15  . Breast cancer Maternal Aunt 59   Social History:  reports that she quit smoking about 27 years ago. she has never used smokeless tobacco. She reports that she does not drink alcohol or use drugs.  Allergies:  Allergies  Allergen Reactions  . Benzalkonium Chloride Anaphylaxis and Hives  . Neosporin [Neomycin-Bacitracin Zn-Polymyx] Anaphylaxis and Hives  . Bee Venom     REACTION: Hives and Swelling  . Bupropion Hcl     REACTION: Mood Changes  . Cephalexin     REACTION: Hives and Swelling  . Latex      (Not in a hospital admission)  No results found  for this or any previous visit (from the past 48 hour(s)). Dg Shoulder Left  Result Date: 07/31/2017 CLINICAL DATA:  Fall onto shoulder EXAM: LEFT SHOULDER - 2+ VIEW COMPARISON:  None. FINDINGS: AC joint appears intact. There is an acute comminuted and separated fracture involving the left humeral head, neck and proximal shaft. Superiorly positioned greater tuberosity fracture fragment. Humeral head positioned over the glenoid on Y-view but appears inferiorly positioned on the frontal view. IMPRESSION: Acute, comminuted and displaced fracture involving the left humeral head, neck and proximal shaft with inferior positioning of the left humeral head with respect to the glenoid fossa. Electronically Signed   By: Donavan Foil M.D.   On: 07/31/2017 00:12   Dg Humerus Left  Result Date: 07/31/2017 CLINICAL DATA:  [place] onto left shoulder EXAM: LEFT HUMERUS - 2+ VIEW COMPARISON:  None. FINDINGS: Comminuted and displaced proximal humerus fracture. The mid to distal humerus is intact. IMPRESSION: Acute comminuted and displaced proximal humerus fracture. Electronically Signed   By: Donavan Foil M.D.   On: 07/31/2017 00:10    Review of Systems  Constitutional: Negative for chills and fever.  HENT: Negative for sore throat and tinnitus.   Eyes: Negative for blurred vision and redness.  Respiratory: Negative for cough and shortness of breath.   Cardiovascular: Negative for chest pain, palpitations, orthopnea and PND.  Gastrointestinal: Negative for abdominal pain, diarrhea, nausea and vomiting.  Genitourinary: Negative for dysuria, frequency and urgency.  Musculoskeletal: Positive for falls and joint pain. Negative for myalgias.  Skin: Negative for rash.       No lesions  Neurological: Negative for speech change, focal weakness and weakness.  Endo/Heme/Allergies: Does not bruise/bleed easily.       No temperature intolerance  Psychiatric/Behavioral: Negative for depression and suicidal ideas.     Blood pressure 136/72, pulse 72, temperature 98 F (36.7 C), resp. rate 18, height 5' 6.5" (1.689 m), weight (!) 165.6 kg (365 lb), SpO2 98 %. Physical Exam  Vitals reviewed. Constitutional: She is oriented to person, place, and time. She appears well-developed and well-nourished. No distress.  HENT:  Head: Normocephalic and atraumatic.  Mouth/Throat: Oropharynx is clear and moist.  Eyes: Conjunctivae and EOM are normal. Pupils are equal, round, and reactive to light. No scleral icterus.  Neck: Normal range of motion. Neck supple. No JVD present. No tracheal deviation present. No thyromegaly present.  Cardiovascular: Normal rate, regular rhythm and normal heart sounds. Exam reveals no gallop and no friction rub.  No murmur heard. Respiratory: Effort normal and breath sounds normal.  GI: Soft. Bowel sounds are normal. She exhibits no distension. There is no tenderness.  Genitourinary:  Genitourinary Comments: Deferred  Musculoskeletal: She exhibits tenderness (Left upper arm). She exhibits no edema.  Range of motion of left arm limited by pain  Lymphadenopathy:    She has no cervical adenopathy.  Neurological: She is alert and oriented to person, place, and time. No cranial nerve deficit. She exhibits normal muscle tone.  Skin: Skin is warm and dry. No rash noted. No erythema.  Psychiatric: She has a normal mood and affect. Her behavior is normal. Judgment and thought content normal.     Assessment/Plan This is a 46 year old female admitted for intractable pain. 1.  Intractable pain: Secondary to fracture of the left humerus.  The patient has been placed in a sling and we will provide iv analgesia (dilaudid)  until she can be evaluated by orthopedic service. 2.  Humerus fracture: Manage pain; consult orthopedics 3.  Asthma: Controlled; continue albuterol as needed 4.  Hypertension: Controlled; continue lisinopril and hydrochlorothiazide 5.  Obesity: BMI of 59.37; encouraged  healthy diet and exercise 6.  Depression: Continue sertraline 7.  DVT prophylaxis: Lovenox 8.  GI prophylaxis: None The patient is a full code.  Time spent on admission orders and patient care approximately 45 minutes  Harrie Foreman, MD 07/31/2017, 1:45 AM

## 2017-07-31 NOTE — Care Management (Signed)
This is a no charge note  Transfer from [facility] per Dr. Delma Freeze per Dr. Jannifer Franklin  47 year old lady with past medical history of obesity, hypertension, asthma, depression, who presents with left humerus fracture after fall. Orthopedic surgeon was consulted. Due to the complexity of surgery, pt needs to be transferred to [facility] hospital. They have consulted Dr Altamese [place], trauma surgeon about this case, and he and his partner Dr Lennette Bihari Haddix have agreed to accept her for possible surgery. Pt is accepted to med-surg bed as inpt. Likely do surgery tomorrow.  Please call manager of [facility] at [phone] when pt arrives to floor   Ivor Costa, MD  [facility] Pager [phone]  If 7PM-7AM, please contact night-coverage [email or web address] Password [facility] 07/31/2017, 10:42 PM

## 2017-07-31 NOTE — Care Management (Signed)
Leader came to this [facility] requesting follow up with patient as she does not know what her plan of care will be. She states she is waiting on orthopedic intervention potentially at [facility] and that Dr. Lester [place] was working on that.  I asked RN to contact Dr. Bridgett Larsson to communicate this with patient. She paged Dr. Bridgett Larsson.

## 2017-07-31 NOTE — Progress Notes (Signed)
Patient transferred from the ED to room 157 via stretcher. Oriented patient to room and room equipment and educated about fall risk policy and prevention along with bed alarm activated. Pain med given per PRN order for left shoulder pain- pain level 9-10 on two different occurrences throughout the shift. Currently patient is resting. Will continue to monitor patient to end of shift.

## 2017-07-31 NOTE — Progress Notes (Signed)
Report called to receiving RN on 5N at [facility]. [facility] notified and arrived to transport the patient. EMTALA form completed. Pain medication given prior to transport for patient comfort. Patient's belongings sent with patient. Earleen Reaper, RN

## 2017-07-31 NOTE — Progress Notes (Signed)
Left arm and shoulder pain Vital signs reviewed. Intractable pain: Secondary to fracture of the left humerus Pain control and follow orthopedic surgeon. Asthma.  Stable.   Hypertension.  Controlled with lisinopril and HCTZ. Morbid obesity.  Discussed the patient and nurse.  Time spent 25 minutes.

## 2017-07-31 NOTE — Progress Notes (Signed)
Lovenox changed to 40 mg BID for BMI >40 and CrCl >30. 

## 2017-07-31 NOTE — Discharge Summary (Signed)
[facility] [place] NAME: Caroleen Stoermer    MR#:  [id number]  DATE OF BIRTH:  [birth year]  DATE OF ADMISSION:  07/30/2017 ADMITTING PHYSICIAN: Harrie Foreman, MD  DATE OF DISCHARGE: No discharge date for patient encounter.  PRIMARY CARE PHYSICIAN: Lucille Passy, MD    ADMISSION DIAGNOSIS:  Closed displaced comminuted fracture of shaft of left humerus, initial encounter [S42.352A]  DISCHARGE DIAGNOSIS:  Active Problems:   Intractable pain   SECONDARY DIAGNOSIS:   Past Medical History:  Diagnosis Date  . Asthma   . Depression   . Family history of adverse reaction to anesthesia    FATHER WAS SLOW TO WAKE UP  . Headache    OCCASIONAL MIGRAINES  . HNP (herniated nucleus pulposus), lumbar   . Hx of epistaxis   . Insomnia   . OA (osteoarthritis)   . Obesity   . PCOS (polycystic ovarian syndrome)   . Sensitive skin   . Vitamin D deficiency     CHIEF COMPLAINT:   Chief Complaint  Patient presents with  . Fall    HISTORY OF PRESENT ILLNESS ON ADMISSION -per Dr. Marcille Blanco:  HPI: The patient with past medical history of asthma and chronic back pain presents to the emergency department after a fall.  The patient tripped while walking upstairs and struck her left shoulder on a door frame.  X-ray in the emergency department revealed a comminuted fracture of the left upper humerus.  The patient was placed in a sling and received analgesia but could not bear the pain of her injury which prompted the emergency department staff to call the hospitalist service for further management.    HOSPITAL COURSE:  Orthopedic surgery was contacted for consult and felt that the severity of her fracture was more complicated than they were comfortable handling here.  Trauma surgery from [facility] was contacted about the case and said that they would take the patient for surgical repair.  Transfer initiated to [facility].  CONSULTS OBTAINED:  Treatment Team:  Earnestine Leys, MD  DRUG ALLERGIES:   Allergies  Allergen Reactions  . Benzalkonium Chloride Anaphylaxis and Hives  . Neosporin [Neomycin-Bacitracin Zn-Polymyx] Anaphylaxis and Hives  . Bee Venom     REACTION: Hives and Swelling  . Bupropion Hcl     REACTION: Mood Changes  . Cephalexin     REACTION: Hives and Swelling  . Latex     DISCHARGE MEDICATIONS:   Allergies as of 07/31/2017      Reactions   Benzalkonium Chloride Anaphylaxis, Hives   Neosporin [neomycin-bacitracin Zn-polymyx] Anaphylaxis, Hives   Bee Venom    REACTION: Hives and Swelling   Bupropion Hcl    REACTION: Mood Changes   Cephalexin    REACTION: Hives and Swelling   Latex       Medication List    STOP taking these medications   magnesium 30 MG tablet   pseudoephedrine 120 MG 12 hr tablet Commonly known as:  SUDAFED     TAKE these medications   acetaminophen 650 MG CR tablet Commonly known as:  TYLENOL Take 1,300 mg by mouth every 8 (eight) hours as needed for pain.   albuterol 108 (90 Base) MCG/ACT inhaler Commonly known as:  VENTOLIN HFA Inhale 2 puffs into the lungs every 6 (six) hours as needed.   ANTACID ANTI-GAS PO Take 1 tablet by mouth daily as needed (for gas).   aspirin 81 MG tablet  Take 81 mg by mouth daily.   calcium carbonate 500 MG chewable tablet Commonly known as:  TUMS - dosed in mg elemental calcium Chew 1 tablet by mouth daily.   calcium-vitamin D 500-200 MG-UNIT tablet Commonly known as:  OSCAL WITH D Take 1 tablet by mouth daily with breakfast.   docusate sodium 100 MG capsule Commonly known as:  COLACE Take 1 capsule (100 mg total) by mouth 2 (two) times daily as needed for mild constipation.   ECHINACEA-GOLDEN SEAL PO Take 1 tablet by mouth 2 (two) times daily.   hydrochlorothiazide 25 MG tablet Commonly known as:  HYDRODIURIL Take 1 tablet (25 mg total) by mouth daily.   ibuprofen 800 MG tablet Commonly  known as:  ADVIL,MOTRIN Take 1 tablet (800 mg total) by mouth every 8 (eight) hours as needed.   lisinopril 10 MG tablet Commonly known as:  PRINIVIL,ZESTRIL Take 1 tablet (10 mg total) by mouth daily.   magnesium oxide 400 (241.3 Mg) MG tablet Commonly known as:  MAG-OX Take 1 tablet (400 mg total) by mouth daily. Start taking on:  08/01/2017   Melatonin 10 MG Tabs Take 1 tablet by mouth at bedtime as needed (for sleep).   methocarbamol 500 MG tablet Commonly known as:  ROBAXIN Take 1 tablet (500 mg total) by mouth 3 (three) times daily between meals as needed for muscle spasms.   multivitamin tablet Take 1 tablet by mouth daily.   mupirocin ointment 2 % Commonly known as:  BACTROBAN Apply 1 application topically 2 (two) times daily. To affected area   norethindrone-ethinyl estradiol-iron 1.5-30 MG-MCG tablet Commonly known as:  MICROGESTIN FE,GILDESS FE,LOESTRIN FE Take 1 tablet by mouth daily.   oxyCODONE-acetaminophen 7.5-325 MG tablet Commonly known as:  PERCOCET Take 1 tablet by mouth every 4 (four) hours as needed for severe pain.   sertraline 100 MG tablet Commonly known as:  ZOLOFT Take 1 tablet (100 mg total) by mouth daily.   silver sulfADIAZINE 1 % cream Commonly known as:  SILVADENE Apply 1 application topically 2 (two) times daily.   St Johns Wort 300 MG Caps Take 3 capsules by mouth 2 (two) times daily.   vitamin C 500 MG tablet Commonly known as:  ASCORBIC ACID Take 500 mg by mouth 2 (two) times daily.   Vitamin D 2000 units Caps Take 5,000 Units by mouth daily.   ZINC 15 PO Take by mouth.       DISCHARGE INSTRUCTIONS:   Discharge Diet: NPO  Discharge Condition: Stable  Discharge Activity: Bedrest  Discharge Location: [facility]  Today    VITAL SIGNS:   Vitals:   07/31/17 0617 07/31/17 0757 07/31/17 1800 07/31/17 2025  BP: (!) 107/49 (!) 125/43 (!) 144/50 (!) 157/66  Pulse: 92 84 89 87  Resp: 16 15 14 19   Temp: 98.8  F (37.1 C) 98.6 F (37 C)  98.2 F (36.8 C)  TempSrc: Oral Oral  Oral  SpO2: 97% 99% 98% 96%  Weight:      Height:       Wt Readings from Last 3 Encounters:  07/31/17 (!) 169.4 kg (373 lb 6.4 oz)  06/24/17 (!) 164.6 kg (362 lb 12.8 oz)  06/02/17 (!) 166.1 kg (366 lb 4 oz)    I/O:    Intake/Output Summary (Last 24 hours) at 07/31/2017 2152 Last data filed at 07/31/2017 1900 Gross per 24 hour  Intake 960 ml  Output -  Net 960 ml     DATA  REVIEW:   CBC No results for input(s): WBC, HGB, HCT, PLT in the last 168 hours.  Chemistries  No results for input(s): NA, K, CL, CO2, GLUCOSE, BUN, CREATININE, CALCIUM, MG, AST, ALT, ALKPHOS, BILITOT in the last 168 hours.  Invalid input(s): GFRCGP  Cardiac Enzymes No results for input(s): TROPONINI in the last 168 hours.  Microbiology Results  Results for orders placed or performed during the hospital encounter of 05/26/14  Surgical pcr screen     Status: None   Collection Time: 05/26/14  4:40 PM  Result Value Ref Range Status   MRSA, PCR NEGATIVE NEGATIVE Final   Staphylococcus aureus NEGATIVE NEGATIVE Final    Comment:        The Xpert SA Assay (FDA approved for NASAL specimens in patients over 75 years of age), is one component of a comprehensive surveillance program.  Test performance has been validated by [employer] for patients greater than or equal to 64 year old. It is not intended to diagnose infection nor to guide or monitor treatment.     RADIOLOGY:  Dg Shoulder Left  Result Date: 07/31/2017 CLINICAL DATA:  Fall onto shoulder EXAM: LEFT SHOULDER - 2+ VIEW COMPARISON:  None. FINDINGS: AC joint appears intact. There is an acute comminuted and separated fracture involving the left humeral head, neck and proximal shaft. Superiorly positioned greater tuberosity fracture fragment. Humeral head positioned over the glenoid on Y-view but appears inferiorly positioned on the frontal view. IMPRESSION: Acute,  comminuted and displaced fracture involving the left humeral head, neck and proximal shaft with inferior positioning of the left humeral head with respect to the glenoid fossa. Electronically Signed   By: Donavan Foil M.D.   On: 07/31/2017 00:12   Dg Humerus Left  Result Date: 07/31/2017 CLINICAL DATA:  [place] onto left shoulder EXAM: LEFT HUMERUS - 2+ VIEW COMPARISON:  None. FINDINGS: Comminuted and displaced proximal humerus fracture. The mid to distal humerus is intact. IMPRESSION: Acute comminuted and displaced proximal humerus fracture. Electronically Signed   By: Donavan Foil M.D.   On: 07/31/2017 00:10    EKG:   Orders placed or performed in visit on 04/25/14  . EKG 12-Lead     Management plans discussed with the patient and/or family.  CODE STATUS:     Code Status Orders  (From admission, onward)        Start     Ordered   07/31/17 0149  Full code  Continuous     07/31/17 0149    Code Status History    Date Active Date Inactive Code Status Order ID Comments User Context   06/02/2014 11:50 06/03/2014 14:50 Full Code [id number]  Johnn Hai, MD Inpatient    Advance Directive Documentation     Most Recent Value  Type of Advance Directive  Living will  Pre-existing out of facility DNR order (yellow form or pink MOST form)  No data  "MOST" Form in Place?  No data      TOTAL TIME TAKING CARE OF THIS PATIENT: <30 minutes.    Khady Vandenberg Sabine 07/31/2017, 9:52 PM  [facility]  Office  [phone]  CC: Primary care physician; Lucille Passy, MD  Note:  This document was prepared using Dragon voice recognition software and may include unintentional dictation errors.

## 2017-07-31 NOTE — Progress Notes (Signed)
Per Dr. Bridgett Larsson, patient to stay overnight for continued pain management. Will likely discharge tomorrow.

## 2017-08-01 ENCOUNTER — Inpatient Hospital Stay (HOSPITAL_COMMUNITY): Payer: BLUE CROSS/BLUE SHIELD

## 2017-08-01 ENCOUNTER — Inpatient Hospital Stay (HOSPITAL_COMMUNITY): Payer: BLUE CROSS/BLUE SHIELD | Admitting: Anesthesiology

## 2017-08-01 ENCOUNTER — Observation Stay (HOSPITAL_COMMUNITY)
Admission: AD | Admit: 2017-08-01 | Discharge: 2017-08-03 | Disposition: A | Discharge status: Home / Self Care | Payer: BLUE CROSS/BLUE SHIELD | Source: Other Acute Inpatient Hospital | Attending: Internal Medicine | Admitting: Internal Medicine

## 2017-08-01 ENCOUNTER — Encounter (HOSPITAL_COMMUNITY): Payer: Self-pay | Admitting: Internal Medicine

## 2017-08-01 ENCOUNTER — Encounter (HOSPITAL_COMMUNITY)
Admission: AD | Disposition: A | Discharge status: Home / Self Care | Payer: Self-pay | Source: Other Acute Inpatient Hospital | Attending: Internal Medicine

## 2017-08-01 ENCOUNTER — Inpatient Hospital Stay
Admission: AD | Admit: 2017-08-01 | Payer: BLUE CROSS/BLUE SHIELD | Source: Other Acute Inpatient Hospital | Admitting: Student

## 2017-08-01 DIAGNOSIS — G4700 Insomnia, unspecified: Secondary | ICD-10-CM | POA: Diagnosis not present

## 2017-08-01 DIAGNOSIS — G8929 Other chronic pain: Secondary | ICD-10-CM | POA: Insufficient documentation

## 2017-08-01 DIAGNOSIS — Z7982 Long term (current) use of aspirin: Secondary | ICD-10-CM | POA: Insufficient documentation

## 2017-08-01 DIAGNOSIS — I10 Essential (primary) hypertension: Secondary | ICD-10-CM | POA: Diagnosis present

## 2017-08-01 DIAGNOSIS — S42392A Other fracture of shaft of left humerus, initial encounter for closed fracture: Secondary | ICD-10-CM | POA: Insufficient documentation

## 2017-08-01 DIAGNOSIS — M1990 Unspecified osteoarthritis, unspecified site: Secondary | ICD-10-CM | POA: Diagnosis not present

## 2017-08-01 DIAGNOSIS — G4730 Sleep apnea, unspecified: Secondary | ICD-10-CM | POA: Insufficient documentation

## 2017-08-01 DIAGNOSIS — Z888 Allergy status to other drugs, medicaments and biological substances status: Secondary | ICD-10-CM | POA: Diagnosis not present

## 2017-08-01 DIAGNOSIS — Z91040 Latex allergy status: Secondary | ICD-10-CM | POA: Diagnosis not present

## 2017-08-01 DIAGNOSIS — S42309A Unspecified fracture of shaft of humerus, unspecified arm, initial encounter for closed fracture: Secondary | ICD-10-CM | POA: Diagnosis present

## 2017-08-01 DIAGNOSIS — Z881 Allergy status to other antibiotic agents status: Secondary | ICD-10-CM | POA: Insufficient documentation

## 2017-08-01 DIAGNOSIS — E559 Vitamin D deficiency, unspecified: Secondary | ICD-10-CM | POA: Diagnosis not present

## 2017-08-01 DIAGNOSIS — G4733 Obstructive sleep apnea (adult) (pediatric): Secondary | ICD-10-CM | POA: Insufficient documentation

## 2017-08-01 DIAGNOSIS — Z6841 Body Mass Index (BMI) 40.0 and over, adult: Secondary | ICD-10-CM | POA: Insufficient documentation

## 2017-08-01 DIAGNOSIS — S42202A Unspecified fracture of upper end of left humerus, initial encounter for closed fracture: Secondary | ICD-10-CM

## 2017-08-01 DIAGNOSIS — S42292A Other displaced fracture of upper end of left humerus, initial encounter for closed fracture: Principal | ICD-10-CM | POA: Insufficient documentation

## 2017-08-01 DIAGNOSIS — Z87891 Personal history of nicotine dependence: Secondary | ICD-10-CM | POA: Insufficient documentation

## 2017-08-01 DIAGNOSIS — D72829 Elevated white blood cell count, unspecified: Secondary | ICD-10-CM | POA: Insufficient documentation

## 2017-08-01 DIAGNOSIS — J45909 Unspecified asthma, uncomplicated: Secondary | ICD-10-CM | POA: Insufficient documentation

## 2017-08-01 DIAGNOSIS — Y9389 Activity, other specified: Secondary | ICD-10-CM | POA: Insufficient documentation

## 2017-08-01 DIAGNOSIS — W01198A Fall on same level from slipping, tripping and stumbling with subsequent striking against other object, initial encounter: Secondary | ICD-10-CM | POA: Insufficient documentation

## 2017-08-01 DIAGNOSIS — Z8249 Family history of ischemic heart disease and other diseases of the circulatory system: Secondary | ICD-10-CM | POA: Insufficient documentation

## 2017-08-01 DIAGNOSIS — Z9989 Dependence on other enabling machines and devices: Secondary | ICD-10-CM | POA: Insufficient documentation

## 2017-08-01 DIAGNOSIS — F329 Major depressive disorder, single episode, unspecified: Secondary | ICD-10-CM | POA: Diagnosis not present

## 2017-08-01 DIAGNOSIS — D649 Anemia, unspecified: Secondary | ICD-10-CM | POA: Diagnosis not present

## 2017-08-01 DIAGNOSIS — T148XXA Other injury of unspecified body region, initial encounter: Secondary | ICD-10-CM

## 2017-08-01 DIAGNOSIS — Z419 Encounter for procedure for purposes other than remedying health state, unspecified: Secondary | ICD-10-CM

## 2017-08-01 DIAGNOSIS — Z01811 Encounter for preprocedural respiratory examination: Secondary | ICD-10-CM

## 2017-08-01 DIAGNOSIS — Z79899 Other long term (current) drug therapy: Secondary | ICD-10-CM | POA: Insufficient documentation

## 2017-08-01 DIAGNOSIS — Y92009 Unspecified place in unspecified non-institutional (private) residence as the place of occurrence of the external cause: Secondary | ICD-10-CM | POA: Diagnosis not present

## 2017-08-01 DIAGNOSIS — Z91030 Bee allergy status: Secondary | ICD-10-CM | POA: Diagnosis not present

## 2017-08-01 HISTORY — PX: ORIF HUMERUS FRACTURE: SHX2126

## 2017-08-01 LAB — COMPREHENSIVE METABOLIC PANEL
ALK PHOS: 55 U/L (ref 38–126)
ALT: 35 U/L (ref 14–54)
ANION GAP: 9 (ref 5–15)
AST: 26 U/L (ref 15–41)
Albumin: 3.5 g/dL (ref 3.5–5.0)
BILIRUBIN TOTAL: 1 mg/dL (ref 0.3–1.2)
BUN: 15 mg/dL (ref 6–20)
CALCIUM: 8.4 mg/dL — AB (ref 8.9–10.3)
CO2: 23 mmol/L (ref 22–32)
Chloride: 105 mmol/L (ref 101–111)
Creatinine, Ser: 0.73 mg/dL (ref 0.44–1.00)
GLUCOSE: 152 mg/dL — AB (ref 65–99)
Potassium: 4.3 mmol/L (ref 3.5–5.1)
Sodium: 137 mmol/L (ref 135–145)
TOTAL PROTEIN: 6.6 g/dL (ref 6.5–8.1)

## 2017-08-01 LAB — POCT I-STAT 4, (NA,K, GLUC, HGB,HCT)
Glucose, Bld: 151 mg/dL — ABNORMAL HIGH (ref 65–99)
HCT: 29 % — ABNORMAL LOW (ref 36.0–46.0)
Hemoglobin: 9.9 g/dL — ABNORMAL LOW (ref 12.0–15.0)
Potassium: 4.3 mmol/L (ref 3.5–5.1)
SODIUM: 140 mmol/L (ref 135–145)

## 2017-08-01 LAB — TYPE AND SCREEN
ABO/RH(D): A POS
ANTIBODY SCREEN: NEGATIVE

## 2017-08-01 LAB — CBC WITH DIFFERENTIAL/PLATELET
BASOS ABS: 0 10*3/uL (ref 0.0–0.1)
BASOS PCT: 0 %
Eosinophils Absolute: 0.1 10*3/uL (ref 0.0–0.7)
Eosinophils Relative: 0 %
HEMATOCRIT: 34.7 % — AB (ref 36.0–46.0)
Hemoglobin: 11.3 g/dL — ABNORMAL LOW (ref 12.0–15.0)
Lymphocytes Relative: 26 %
Lymphs Abs: 2.9 10*3/uL (ref 0.7–4.0)
MCH: 30.6 pg (ref 26.0–34.0)
MCHC: 32.6 g/dL (ref 30.0–36.0)
MCV: 94 fL (ref 78.0–100.0)
MONO ABS: 0.9 10*3/uL (ref 0.1–1.0)
Monocytes Relative: 8 %
NEUTROS ABS: 7.4 10*3/uL (ref 1.7–7.7)
Neutrophils Relative %: 66 %
Platelets: 238 10*3/uL (ref 150–400)
RBC: 3.69 MIL/uL — ABNORMAL LOW (ref 3.87–5.11)
RDW: 13 % (ref 11.5–15.5)
WBC: 11.3 10*3/uL — AB (ref 4.0–10.5)

## 2017-08-01 LAB — MRSA PCR SCREENING: MRSA BY PCR: NEGATIVE

## 2017-08-01 LAB — ABO/RH: ABO/RH(D): A POS

## 2017-08-01 SURGERY — OPEN REDUCTION INTERNAL FIXATION (ORIF) PROXIMAL HUMERUS FRACTURE
Anesthesia: Regional | Site: Shoulder | Laterality: Left

## 2017-08-01 MED ORDER — FENTANYL CITRATE (PF) 100 MCG/2ML IJ SOLN
50.0000 ug | INTRAMUSCULAR | Status: DC | PRN
  Administered 2017-08-01: 50 ug via INTRAVENOUS
  Filled 2017-08-01: qty 2

## 2017-08-01 MED ORDER — TOBRAMYCIN SULFATE 1.2 G IJ SOLR
INTRAMUSCULAR | Status: AC
  Filled 2017-08-01: qty 1.2

## 2017-08-01 MED ORDER — ACETAMINOPHEN 325 MG PO TABS
650.0000 mg | ORAL_TABLET | ORAL | Status: DC | PRN
Start: 2017-08-01 — End: 2017-08-03

## 2017-08-01 MED ORDER — LIDOCAINE 2% (20 MG/ML) 5 ML SYRINGE
INTRAMUSCULAR | Status: AC
  Filled 2017-08-01: qty 5

## 2017-08-01 MED ORDER — CEFAZOLIN SODIUM-DEXTROSE 2-4 GM/100ML-% IV SOLN
2.0000 g | Freq: Four times a day (QID) | INTRAVENOUS | Status: AC
  Administered 2017-08-01 – 2017-08-02 (×3): 2 g via INTRAVENOUS
  Filled 2017-08-01 (×3): qty 100

## 2017-08-01 MED ORDER — PROPOFOL 10 MG/ML IV BOLUS
INTRAVENOUS | Status: DC | PRN
  Administered 2017-08-01: 200 mg via INTRAVENOUS
  Administered 2017-08-01: 50 mg via INTRAVENOUS

## 2017-08-01 MED ORDER — ONDANSETRON HCL 4 MG PO TABS: 4.0000 mg | ORAL_TABLET | Freq: Four times a day (QID) | ORAL | Status: DC | PRN

## 2017-08-01 MED ORDER — ONDANSETRON HCL 4 MG/2ML IJ SOLN: 4.0000 mg | Freq: Four times a day (QID) | INTRAMUSCULAR | Status: DC | PRN

## 2017-08-01 MED ORDER — ONDANSETRON HCL 4 MG/2ML IJ SOLN
INTRAMUSCULAR | Status: DC | PRN
  Administered 2017-08-01: 4 mg via INTRAVENOUS

## 2017-08-01 MED ORDER — EPHEDRINE 5 MG/ML INJ
INTRAVENOUS | Status: AC
  Filled 2017-08-01: qty 10

## 2017-08-01 MED ORDER — TOBRAMYCIN SULFATE 1.2 G IJ SOLR
INTRAMUSCULAR | Status: DC | PRN
  Administered 2017-08-01: 1.2 g via TOPICAL

## 2017-08-01 MED ORDER — ALUM & MAG HYDROXIDE-SIMETH 200-200-20 MG/5ML PO SUSP: 30.0000 mL | ORAL | Status: DC | PRN

## 2017-08-01 MED ORDER — MENTHOL 3 MG MT LOZG: 1.0000 | LOZENGE | OROMUCOSAL | Status: DC | PRN

## 2017-08-01 MED ORDER — DOCUSATE SODIUM 100 MG PO CAPS
100.0000 mg | ORAL_CAPSULE | Freq: Two times a day (BID) | ORAL | Status: DC
  Administered 2017-08-01 – 2017-08-02 (×2): 100 mg via ORAL
  Filled 2017-08-01: qty 1

## 2017-08-01 MED ORDER — 0.9 % SODIUM CHLORIDE (POUR BTL) OPTIME
TOPICAL | Status: DC | PRN
  Administered 2017-08-01: 1000 mL

## 2017-08-01 MED ORDER — METHOCARBAMOL 500 MG PO TABS
500.0000 mg | ORAL_TABLET | Freq: Four times a day (QID) | ORAL | Status: DC | PRN
  Administered 2017-08-03: 1000 mg via ORAL
  Filled 2017-08-01: qty 2

## 2017-08-01 MED ORDER — MIDAZOLAM HCL 2 MG/2ML IJ SOLN
INTRAMUSCULAR | Status: AC
  Filled 2017-08-01: qty 2

## 2017-08-01 MED ORDER — LACTATED RINGERS IV SOLN
INTRAVENOUS | Status: DC
  Administered 2017-08-01 (×2): via INTRAVENOUS

## 2017-08-01 MED ORDER — DEXTROSE 5 % IV SOLN
3.0000 g | INTRAVENOUS | Status: AC
  Administered 2017-08-01: 3 g via INTRAVENOUS
  Filled 2017-08-01: qty 3000

## 2017-08-01 MED ORDER — PHENOL 1.4 % MT LIQD: 1.0000 | OROMUCOSAL | Status: DC | PRN

## 2017-08-01 MED ORDER — FENTANYL CITRATE (PF) 250 MCG/5ML IJ SOLN
INTRAMUSCULAR | Status: AC
  Filled 2017-08-01: qty 5

## 2017-08-01 MED ORDER — ACETAMINOPHEN 650 MG RE SUPP: 650.0000 mg | RECTAL | Status: DC | PRN

## 2017-08-01 MED ORDER — ACETAMINOPHEN 325 MG PO TABS: 650.0000 mg | ORAL_TABLET | Freq: Four times a day (QID) | ORAL | Status: DC | PRN

## 2017-08-01 MED ORDER — FENTANYL CITRATE (PF) 100 MCG/2ML IJ SOLN
INTRAMUSCULAR | Status: AC
Start: 2017-08-01 — End: 2017-08-01
  Administered 2017-08-01: 100 ug via INTRAVENOUS
  Filled 2017-08-01: qty 2

## 2017-08-01 MED ORDER — DEXAMETHASONE SODIUM PHOSPHATE 10 MG/ML IJ SOLN
INTRAMUSCULAR | Status: DC | PRN
  Administered 2017-08-01: 10 mg via INTRAVENOUS

## 2017-08-01 MED ORDER — HYDROCODONE-ACETAMINOPHEN 7.5-325 MG PO TABS
2.0000 | ORAL_TABLET | ORAL | Status: DC | PRN
  Administered 2017-08-01 – 2017-08-03 (×9): 2 via ORAL
  Filled 2017-08-01 (×10): qty 2

## 2017-08-01 MED ORDER — SUGAMMADEX SODIUM 200 MG/2ML IV SOLN
INTRAVENOUS | Status: DC | PRN
  Administered 2017-08-01: 350 mg via INTRAVENOUS

## 2017-08-01 MED ORDER — ONDANSETRON HCL 4 MG/2ML IJ SOLN
INTRAMUSCULAR | Status: AC
  Filled 2017-08-01: qty 2

## 2017-08-01 MED ORDER — MIDAZOLAM HCL 5 MG/5ML IJ SOLN: INTRAMUSCULAR | Status: DC | PRN

## 2017-08-01 MED ORDER — ACETAMINOPHEN 650 MG RE SUPP: 650.0000 mg | Freq: Four times a day (QID) | RECTAL | Status: DC | PRN

## 2017-08-01 MED ORDER — HYDRALAZINE HCL 20 MG/ML IJ SOLN: 10.0000 mg | INTRAMUSCULAR | Status: DC | PRN

## 2017-08-01 MED ORDER — VANCOMYCIN HCL 1000 MG IV SOLR
INTRAVENOUS | Status: DC | PRN
  Administered 2017-08-01: 1000 mg via TOPICAL

## 2017-08-01 MED ORDER — FENTANYL CITRATE (PF) 100 MCG/2ML IJ SOLN
100.0000 ug | Freq: Once | INTRAMUSCULAR | Status: AC
  Administered 2017-08-01: 100 ug via INTRAVENOUS

## 2017-08-01 MED ORDER — ROCURONIUM BROMIDE 100 MG/10ML IV SOLN
INTRAVENOUS | Status: DC | PRN
  Administered 2017-08-01: 20 mg via INTRAVENOUS
  Administered 2017-08-01: 50 mg via INTRAVENOUS
  Administered 2017-08-01 (×4): 20 mg via INTRAVENOUS
  Administered 2017-08-01: 10 mg via INTRAVENOUS

## 2017-08-01 MED ORDER — MIDAZOLAM HCL 2 MG/2ML IJ SOLN
INTRAMUSCULAR | Status: AC
  Administered 2017-08-01: 2 mg via INTRAVENOUS
  Filled 2017-08-01: qty 2

## 2017-08-01 MED ORDER — ENOXAPARIN SODIUM 100 MG/ML ~~LOC~~ SOLN
0.5000 mg/kg | SUBCUTANEOUS | Status: DC
  Administered 2017-08-02 – 2017-08-03 (×2): 85 mg via SUBCUTANEOUS
  Filled 2017-08-01 (×2): qty 0.85

## 2017-08-01 MED ORDER — METOCLOPRAMIDE HCL 5 MG/ML IJ SOLN: 5.0000 mg | Freq: Three times a day (TID) | INTRAMUSCULAR | Status: DC | PRN

## 2017-08-01 MED ORDER — PROPOFOL 10 MG/ML IV BOLUS
INTRAVENOUS | Status: AC
  Filled 2017-08-01: qty 20

## 2017-08-01 MED ORDER — FENTANYL CITRATE (PF) 100 MCG/2ML IJ SOLN
INTRAMUSCULAR | Status: DC | PRN
  Administered 2017-08-01: 50 ug via INTRAVENOUS
  Administered 2017-08-01: 150 ug via INTRAVENOUS
  Administered 2017-08-01 (×3): 50 ug via INTRAVENOUS

## 2017-08-01 MED ORDER — ROCURONIUM BROMIDE 10 MG/ML (PF) SYRINGE
PREFILLED_SYRINGE | INTRAVENOUS | Status: AC
  Filled 2017-08-01: qty 5

## 2017-08-01 MED ORDER — SUGAMMADEX SODIUM 200 MG/2ML IV SOLN
INTRAVENOUS | Status: AC
  Filled 2017-08-01: qty 2

## 2017-08-01 MED ORDER — ROPIVACAINE HCL 7.5 MG/ML IJ SOLN
INTRAMUSCULAR | Status: DC | PRN
  Administered 2017-08-01 (×4): 5 mL via PERINEURAL

## 2017-08-01 MED ORDER — VANCOMYCIN HCL 1000 MG IV SOLR
INTRAVENOUS | Status: AC
  Filled 2017-08-01: qty 1000

## 2017-08-01 MED ORDER — HYDROMORPHONE HCL 1 MG/ML IJ SOLN: 0.5000 mg | INTRAMUSCULAR | Status: DC | PRN

## 2017-08-01 MED ORDER — HYDROCODONE-ACETAMINOPHEN 7.5-325 MG PO TABS
1.0000 | ORAL_TABLET | ORAL | Status: DC | PRN
  Administered 2017-08-01 – 2017-08-02 (×2): 1 via ORAL
  Filled 2017-08-01: qty 1

## 2017-08-01 MED ORDER — SUCCINYLCHOLINE CHLORIDE 200 MG/10ML IV SOSY
PREFILLED_SYRINGE | INTRAVENOUS | Status: AC
  Filled 2017-08-01: qty 10

## 2017-08-01 MED ORDER — SODIUM CHLORIDE 0.9 % IR SOLN
Status: DC | PRN
  Administered 2017-08-01: 3000 mL

## 2017-08-01 MED ORDER — METHOCARBAMOL 1000 MG/10ML IJ SOLN
1000.0000 mg | Freq: Four times a day (QID) | INTRAVENOUS | Status: DC | PRN
  Filled 2017-08-01: qty 10

## 2017-08-01 MED ORDER — DEXAMETHASONE SODIUM PHOSPHATE 10 MG/ML IJ SOLN
INTRAMUSCULAR | Status: AC
  Filled 2017-08-01: qty 1

## 2017-08-01 MED ORDER — METOCLOPRAMIDE HCL 5 MG PO TABS: 5.0000 mg | ORAL_TABLET | Freq: Three times a day (TID) | ORAL | Status: DC | PRN

## 2017-08-01 MED ORDER — ALBUMIN HUMAN 5 % IV SOLN
INTRAVENOUS | Status: DC | PRN
  Administered 2017-08-01: 10:00:00 via INTRAVENOUS

## 2017-08-01 MED ORDER — PHENYLEPHRINE 40 MCG/ML (10ML) SYRINGE FOR IV PUSH (FOR BLOOD PRESSURE SUPPORT)
PREFILLED_SYRINGE | INTRAVENOUS | Status: AC
  Filled 2017-08-01: qty 10

## 2017-08-01 MED ORDER — MIDAZOLAM HCL 2 MG/2ML IJ SOLN
2.0000 mg | Freq: Once | INTRAMUSCULAR | Status: AC
  Administered 2017-08-01: 2 mg via INTRAVENOUS

## 2017-08-01 SURGICAL SUPPLY — 64 items
BANDAGE ELASTIC 6 VELCRO ST LF (GAUZE/BANDAGES/DRESSINGS) ×2 IMPLANT
BIT DRILL 2.5X110 QC LCP DISP (BIT) ×1 IMPLANT
BIT DRILL PERC QC 2.8X200 100 (BIT) IMPLANT
BRUSH SCRUB SURG 4.25 DISP (MISCELLANEOUS) ×4 IMPLANT
CANISTER SUCT 3000ML (MISCELLANEOUS) ×1 IMPLANT
CHLORAPREP W/TINT 26ML (MISCELLANEOUS) ×3 IMPLANT
COVER SURGICAL LIGHT HANDLE (MISCELLANEOUS) ×4 IMPLANT
DRAPE C-ARM 42X72 X-RAY (DRAPES) ×2 IMPLANT
DRAPE ORTHO SPLIT 87X125 STRL (DRAPES) ×2 IMPLANT
DRAPE PROXIMA HALF (DRAPES) ×1 IMPLANT
DRAPE SURG 17X23 STRL (DRAPES) ×4 IMPLANT
DRAPE U-SHAPE 47X51 STRL (DRAPES) ×4 IMPLANT
DRILL BIT QUICK COUP 2.8MM 100 (BIT) ×1
DRSG MEPILEX BORDER 4X12 (GAUZE/BANDAGES/DRESSINGS) ×1 IMPLANT
DRSG MEPILEX BORDER 4X8 (GAUZE/BANDAGES/DRESSINGS) ×1 IMPLANT
ELECT REM PT RETURN 9FT ADLT (ELECTROSURGICAL) ×2
ELECTRODE REM PT RTRN 9FT ADLT (ELECTROSURGICAL) ×1 IMPLANT
GLOVE BIO SURGEON STRL SZ7.5 (GLOVE) ×6 IMPLANT
GLOVE BIOGEL PI IND STRL 7.5 (GLOVE) ×1 IMPLANT
GLOVE BIOGEL PI INDICATOR 7.5 (GLOVE) ×1
GOWN STRL REUS W/ TWL LRG LVL3 (GOWN DISPOSABLE) ×2 IMPLANT
GOWN STRL REUS W/TWL LRG LVL3 (GOWN DISPOSABLE) ×4
GRAFT FIBULA 6CM (Bone Implant) ×1 IMPLANT
HANDPIECE INTERPULSE COAX TIP (DISPOSABLE) ×2
HOVERMATT SINGLE USE (MISCELLANEOUS) ×1 IMPLANT
KIT BASIN OR (CUSTOM PROCEDURE TRAY) ×2 IMPLANT
KIT ROOM TURNOVER OR (KITS) ×2 IMPLANT
MANIFOLD NEPTUNE II (INSTRUMENTS) ×2 IMPLANT
NS IRRIG 1000ML POUR BTL (IV SOLUTION) ×2 IMPLANT
PACK SHOULDER (CUSTOM PROCEDURE TRAY) ×2 IMPLANT
PAD ARMBOARD 7.5X6 YLW CONV (MISCELLANEOUS) ×4 IMPLANT
PLATE LCP 3.5 PROX HUM 5HX114 (Plate) ×1 IMPLANT
SCREW CORTEX 3.5 28MM (Screw) ×1 IMPLANT
SCREW LOCK CORT ST 3.5X28 (Screw) IMPLANT
SCREW LOCK T15 FT 18X3.5X2.9X (Screw) IMPLANT
SCREW LOCK T15 FT 28X3.5X2.9X (Screw) IMPLANT
SCREW LOCK T15 FT 38X3.5XST (Screw) IMPLANT
SCREW LOCK T15 FT 40X3.5XST (Screw) IMPLANT
SCREW LOCKING 3.5X18 (Screw) ×2 IMPLANT
SCREW LOCKING 3.5X28 (Screw) ×4 IMPLANT
SCREW LOCKING 3.5X38 (Screw) ×4 IMPLANT
SCREW LOCKING 3.5X40 (Screw) ×2 IMPLANT
SCREW LOCKING 3.5X45 (Screw) ×2 IMPLANT
SCREW LOCKING 3.5X50 (Screw) ×1 IMPLANT
SCREW LOCKING 3.5X52MM (Screw) ×2 IMPLANT
SET HNDPC FAN SPRY TIP SCT (DISPOSABLE) IMPLANT
SLING ARM FOAM STRAP XLG (SOFTGOODS) ×1 IMPLANT
SPONGE LAP 18X18 X RAY DECT (DISPOSABLE) ×3 IMPLANT
STAPLER VISISTAT 35W (STAPLE) ×2 IMPLANT
SUCTION FRAZIER HANDLE 10FR (MISCELLANEOUS) ×1
SUCTION TUBE FRAZIER 10FR DISP (MISCELLANEOUS) ×1 IMPLANT
SUT ETHILON 3 0 FSL (SUTURE) IMPLANT
SUT ETHILON 3 0 PS 1 (SUTURE) ×1 IMPLANT
SUT FIBERWIRE #2 38 T-5 BLUE (SUTURE) ×6
SUT MNCRL AB 3-0 PS2 18 (SUTURE) ×2 IMPLANT
SUT VIC AB 0 CT1 27 (SUTURE) ×8
SUT VIC AB 0 CT1 27XBRD ANBCTR (SUTURE) ×2 IMPLANT
SUT VIC AB 2-0 CT1 27 (SUTURE) ×6
SUT VIC AB 2-0 CT1 TAPERPNT 27 (SUTURE) ×2 IMPLANT
SUTURE FIBERWR #2 38 T-5 BLUE (SUTURE) ×2 IMPLANT
TOWEL OR 17X26 10 PK STRL BLUE (TOWEL DISPOSABLE) ×4 IMPLANT
TRAY FOLEY METER SIL LF 16FR (CATHETERS) ×1 IMPLANT
TRAY FOLEY W/METER SILVER 16FR (SET/KITS/TRAYS/PACK) IMPLANT
WATER STERILE IRR 1000ML POUR (IV SOLUTION) ×1 IMPLANT

## 2017-08-01 NOTE — Progress Notes (Signed)
Provider notified second time for admission orders.

## 2017-08-01 NOTE — Progress Notes (Signed)
   Follow Up Note  HPI: Please kindly refer to full H&P dated today  Briefly pt admitted for fracture of her L proximal humerus after a mechanical fall, landing on her L arm. Ortho was consulted and pt had ORIF of L humerus.   Today, saw pt after surgery, reported tolerable pain to the L arm, denies any chest pain, SOB, abdominal pain, N/V, fever/chills  Exam: CV: S1, S2 present Lungs: CTA Abd: Obese, NT, soft, BS present Ext: No pedal edema noted  Present on Admission: . Humerus fracture . HTN (hypertension) . Asthma   Disposition:  Continue present management Started pt on DVT ppx with lovenox

## 2017-08-01 NOTE — Anesthesia Procedure Notes (Addendum)
Anesthesia Regional Block: Interscalene brachial plexus block   Pre-Anesthetic Checklist: ,, timeout performed, Correct Patient, Correct Site, Correct Laterality, Correct Procedure, Correct Position, site marked, Risks and benefits discussed,  Surgical consent,  Pre-op evaluation,  At surgeon's request and post-op pain management  Laterality: Left and Upper  Prep: chloraprep       Needles:  Injection technique: Single-shot  Needle Type: Echogenic Stimulator Needle     Needle Length: 9cm  Needle Gauge: 21   Needle insertion depth: 2 cm   Additional Needles:   Procedures:,,,, ultrasound used (permanent image in chart),,,,  Narrative:  Start time: 08/01/2017 8:05 AM End time: 08/01/2017 8:15 AM Injection made incrementally with aspirations every 5 mL.  Performed by: Personally  Anesthesiologist: Lyn Hollingshead, MD

## 2017-08-01 NOTE — Progress Notes (Signed)
Patient admitted from [facility] with left humerus fracture. Alert and oriented. Complains of pain. Has a sling on. Provider on.call notified.

## 2017-08-01 NOTE — Progress Notes (Signed)
Paged the provider. Still waiting for admission orders.

## 2017-08-01 NOTE — Consult Note (Signed)
Orthopaedic Trauma Service (OTS) Consult   Patient ID: Danielle Irwin MRN: [id number] DOB/AGE: 1970/07/16 47 y.o.  Reason for Consult: Left proximal humerus fracture Referring Physician: Earnestine Leys, MD, [facility]  HPI: Danielle Irwin is an 47 y.o. female who is being seen in consultation at the request of Dr. Sabra Heck for evaluation of left proximal humerus fracture.  This is a pleasant 47 year old right-hand-dominant female who unfortunately tripped and fell and landed on her right arm.  She is brought to the hospital where she was found to have a 4 part left proximal humerus fracture.  X-rays were obtained and she was subsequently referred to [place] for the complexity and difficulty of her fracture due to her body habitus and the fracture pattern.  Otherwise she has been in relatively good health.  She had a previous microdiscectomy here in [place] approximately 3 years ago for which she had a reaction to vancomycin.  But otherwise she has been in good health.  She works at [employer].  Past Medical History:  Diagnosis Date  . Asthma   . Depression   . Family history of adverse reaction to anesthesia    FATHER WAS SLOW TO WAKE UP  . Headache    OCCASIONAL MIGRAINES  . HNP (herniated nucleus pulposus), lumbar   . Hx of epistaxis   . Insomnia   . OA (osteoarthritis)   . Obesity   . PCOS (polycystic ovarian syndrome)   . Sensitive skin   . Vitamin D deficiency     Past Surgical History:  Procedure Laterality Date  . LUMBAR LAMINECTOMY/DECOMPRESSION MICRODISCECTOMY Right 06/02/2014   Procedure: MICRO LUMBAR DECOMPRESSION L4 - L5 ON THE RIGHT 1 LEVEL;  Surgeon: Johnn Hai, MD;  Location: [facility] ORS;  Service: Orthopedics;  Laterality: Right;  . WISDOM TOOTH EXTRACTION      Family History  Problem Relation Age of Onset  . Heart attack Father 68  . Breast cancer Maternal Aunt 24    Social History:  reports that she quit smoking about 27 years ago. she has never used smokeless  tobacco. She reports that she does not drink alcohol or use drugs.  Allergies:  Allergies  Allergen Reactions  . Benzalkonium Chloride Anaphylaxis and Hives  . Neosporin [Neomycin-Bacitracin Zn-Polymyx] Anaphylaxis and Hives  . Bee Venom     REACTION: Hives and Swelling  . Bupropion Hcl     REACTION: Mood Changes  . Cephalexin     REACTION: Hives and Swelling  . Latex     Medications:  No current facility-administered medications on file prior to encounter.    Current Outpatient Medications on File Prior to Encounter  Medication Sig Dispense Refill  . acetaminophen (TYLENOL) 650 MG CR tablet Take 1,300 mg by mouth every 8 (eight) hours as needed for pain.    Marland Kitchen albuterol (VENTOLIN HFA) 108 (90 Base) MCG/ACT inhaler Inhale 2 puffs into the lungs every 6 (six) hours as needed. 1 Inhaler 1  . Alum & Mag Hydroxide-Simeth (ANTACID ANTI-GAS PO) Take 1 tablet by mouth daily as needed (for gas).    Marland Kitchen aspirin 81 MG tablet Take 81 mg by mouth daily.    . calcium carbonate (TUMS - DOSED IN MG ELEMENTAL CALCIUM) 500 MG chewable tablet Chew 1 tablet by mouth daily.      . calcium-vitamin D (OSCAL WITH D) 500-200 MG-UNIT per tablet Take 1 tablet by mouth daily with breakfast.    . Cholecalciferol (VITAMIN D) 2000 UNITS CAPS Take 5,000 Units  by mouth daily.     Marland Kitchen docusate sodium (COLACE) 100 MG capsule Take 1 capsule (100 mg total) by mouth 2 (two) times daily as needed for mild constipation. 20 capsule 1  . ECHINACEA-GOLDEN SEAL PO Take 1 tablet by mouth 2 (two) times daily.    . hydrochlorothiazide (HYDRODIURIL) 25 MG tablet Take 1 tablet (25 mg total) by mouth daily. 30 tablet 11  . ibuprofen (ADVIL,MOTRIN) 800 MG tablet Take 1 tablet (800 mg total) by mouth every 8 (eight) hours as needed. 30 tablet 0  . lisinopril (PRINIVIL,ZESTRIL) 10 MG tablet Take 1 tablet (10 mg total) by mouth daily. 90 tablet 3  . magnesium oxide (MAG-OX) 400 (241.3 Mg) MG tablet Take 1 tablet (400 mg total) by mouth  daily.    . Melatonin 10 MG TABS Take 1 tablet by mouth at bedtime as needed (for sleep).    . methocarbamol (ROBAXIN) 500 MG tablet Take 1 tablet (500 mg total) by mouth 3 (three) times daily between meals as needed for muscle spasms. 40 tablet 1  . Multiple Vitamin (MULTIVITAMIN) tablet Take 1 tablet by mouth daily.      . mupirocin ointment (BACTROBAN) 2 % Apply 1 application topically 2 (two) times daily. To affected area 15 g 1  . norethindrone-ethinyl estradiol-iron (MICROGESTIN FE,GILDESS FE,LOESTRIN FE) 1.5-30 MG-MCG tablet Take 1 tablet by mouth daily.    Marland Kitchen oxyCODONE-acetaminophen (PERCOCET) 7.5-325 MG tablet Take 1 tablet by mouth every 4 (four) hours as needed for severe pain.    Marland Kitchen sertraline (ZOLOFT) 100 MG tablet Take 1 tablet (100 mg total) by mouth daily. 30 tablet 3  . silver sulfADIAZINE (SILVADENE) 1 % cream Apply 1 application topically 2 (two) times daily. 50 g 0  . St Johns Wort 300 MG CAPS Take 3 capsules by mouth 2 (two) times daily.     . vitamin C (ASCORBIC ACID) 500 MG tablet Take 500 mg by mouth 2 (two) times daily.    . Zinc Sulfate (ZINC 15 PO) Take by mouth.      ROS: Constitutional: No fever or chills Vision: No changes in vision ENT: No difficulty swallowing CV: No chest pain Pulm: No SOB or wheezing GI: No nausea or vomiting GU: No urgency or inability to hold urine Skin: No poor wound healing Neurologic: No numbness or tingling Psychiatric: No depression or anxiety Heme: No bruising Allergic: No reaction to medications or food   Exam: Blood pressure (!) 136/51, pulse 90, temperature 98.3 F (36.8 C), temperature source Oral, resp. rate 18, height 5\' 6"  (1.676 m), weight (!) 171.2 kg (377 lb 6.8 oz), SpO2 98 %. General: No acute distress Orientation: Awake alert and oriented x3  Mood and Affect: cooperative and pleasant Gait: Unable to perform Coordination and balance: Within normal limits   Left upper extremity: Reveals skin without lesions.  No  obvious deformity.  Pain with any attempted range of motion.  Elbow and wrist without obvious deformity or skin lesions.  She is neurovascularly intact to median, ulnar and radial nerve as well as the axillary nerve.  She has a 2+ radial pulse.  No lymphadenopathy and normal reflexes.    Right upper extremity and bilateral lower extremities:skin without lesions. No tenderness to palpation. Full painless ROM, full strength in each muscle groups without evidence of instability.   Medical Decision Making: Imaging: X-rays of the left shoulder show 4 part left proximal humerus fracture with an impaction with inferior subluxation of the head.  The shoulder  appears to be located.  Labs:  CBC    Component Value Date/Time   WBC 11.3 (H) 08/01/2017 0250   RBC 3.69 (L) 08/01/2017 0250   HGB 11.3 (L) 08/01/2017 0250   HCT 34.7 (L) 08/01/2017 0250   PLT 238 08/01/2017 0250   MCV 94.0 08/01/2017 0250   MCH 30.6 08/01/2017 0250   MCHC 32.6 08/01/2017 0250   RDW 13.0 08/01/2017 0250   LYMPHSABS 2.9 08/01/2017 0250   MONOABS 0.9 08/01/2017 0250   EOSABS 0.1 08/01/2017 0250   BASOSABS 0.0 08/01/2017 0250    Medical history and chart was reviewed  Assessment/Plan: 47 year old right-hand-dominant female with left 4 part proximal humerus fracture   In her young age and activity level I feel that ORIF is necessary to provide her decent shoulder function. I do not feel that nonoperative treatment is an option.  I also feel that arthroplasty is not an option in her body habitus a ORIF is most appropriate. I discussed risks and benefits with the patient. Risks discussed included bleeding requiring blood transfusion, bleeding causing a hematoma, infection, malunion, nonunion, damage to surrounding nerves and blood vessels, pain, hardware prominence or irritation, hardware failure, humeral head cutout, stiffness, post-traumatic arthritis, DVT/PE, compartment syndrome, and even death. She agrees to proceed.     Shona Needles, MD [facility] [phone] (phone)

## 2017-08-01 NOTE — Progress Notes (Signed)
Patient stated she was unable to void for urine pregnancy. Patient also stated that there was "no way" she could be pregnant because she was "not in a relationship" and had not had "sex in eight years."  Anesthesia notified and stated "not to worry about urine pregnancy" at this time. Orders received and carried out.

## 2017-08-01 NOTE — Anesthesia Preprocedure Evaluation (Addendum)
Anesthesia Evaluation  Patient identified by MRN, date of birth, ID band Patient awake    Reviewed: Allergy & Precautions, NPO status , Patient's Chart, lab work & pertinent test results  Airway Mallampati: II       Dental no notable dental hx. (+) Teeth Intact   Pulmonary asthma , former smoker,    Pulmonary exam normal breath sounds clear to auscultation       Cardiovascular hypertension, Pt. on medications Normal cardiovascular exam Rhythm:Regular Rate:Normal     Neuro/Psych    GI/Hepatic negative GI ROS, Neg liver ROS,   Endo/Other  Morbid obesity  Renal/GU negative Renal ROS     Musculoskeletal   Abdominal (+) + obese,   Peds  Hematology   Anesthesia Other Findings   Reproductive/Obstetrics                             Anesthesia Physical Anesthesia Plan  ASA: III  Anesthesia Plan: General   Post-op Pain Management:  Regional for Post-op pain   Induction: Intravenous  PONV Risk Score and Plan: 3 and Ondansetron and Dexamethasone  Airway Management Planned: Oral ETT and Video Laryngoscope Planned  Additional Equipment:   Intra-op Plan:   Post-operative Plan: Extubation in OR  Informed Consent: I have reviewed the patients History and Physical, chart, labs and discussed the procedure including the risks, benefits and alternatives for the proposed anesthesia with the patient or authorized representative who has indicated his/her understanding and acceptance.     Plan Discussed with: CRNA and Surgeon  Anesthesia Plan Comments:         Anesthesia Quick Evaluation

## 2017-08-01 NOTE — Anesthesia Procedure Notes (Signed)
Procedure Name: Intubation Date/Time: 08/01/2017 8:51 AM Performed by: Shirlyn Goltz, CRNA Pre-anesthesia Checklist: Patient identified, Emergency Drugs available, Suction available and Patient being monitored Patient Re-evaluated:Patient Re-evaluated prior to induction Oxygen Delivery Method: Circle system utilized Preoxygenation: Pre-oxygenation with 100% oxygen Induction Type: IV induction Ventilation: Mask ventilation without difficulty Laryngoscope Size: Mac and 3 Grade View: Grade III Tube type: Oral Tube size: 7.0 mm Number of attempts: 1 Airway Equipment and Method: Stylet Placement Confirmation: ETT inserted through vocal cords under direct vision,  positive ETCO2 and breath sounds checked- equal and bilateral Secured at: 20 cm Tube secured with: Tape Dental Injury: Teeth and Oropharynx as per pre-operative assessment

## 2017-08-01 NOTE — Anesthesia Postprocedure Evaluation (Signed)
Anesthesia Post Note  Patient: Danielle Irwin  Procedure(s) Performed: OPEN REDUCTION INTERNAL FIXATION (ORIF) PROXIMAL HUMERUS FRACTURE (Left Shoulder)     Patient location during evaluation: PACU Anesthesia Type: General Level of consciousness: awake Pain management: pain level controlled Vital Signs Assessment: post-procedure vital signs reviewed and stable Respiratory status: spontaneous breathing Cardiovascular status: stable Postop Assessment: no apparent nausea or vomiting Anesthetic complications: no    Last Vitals:  Vitals:   08/01/17 1315 08/01/17 1345  BP: (!) 143/66 (!) 131/58  Pulse: 93 87  Resp: 17 18  Temp: (!) 36.4 C 36.7 C  SpO2: 96% 96%    Last Pain:  Vitals:   08/01/17 1345  TempSrc: Oral  PainSc:    Pain Goal:                 Rilynne Lonsway JR,JOHN Attie Nawabi

## 2017-08-01 NOTE — Progress Notes (Addendum)
Asked to assist with Lovenox dosing as BMI >60 and patient is post-op for ORIF L proximal humerus and ORIF L humeral shaft.  Weight: 171 kg, SCr 0.73, nCrCl >183mL/min.  Hgb 11.3, plts 238- no overt bleeding noted.  Plan: Lovenox 0.5mg /kg (85mg )subQ q24h for VTE prophylaxis starting 2/23 at 0600 as patient is post-op  Pharmacy to sign off as no dose adjustments anticipated  Bryssa Tones D. Billey Wojciak, PharmD, [place] Clinical Pharmacist [phone] 08/01/2017 5:40 PM

## 2017-08-01 NOTE — Transfer of Care (Signed)
Immediate Anesthesia Transfer of Care Note  Patient: Danielle Irwin  Procedure(s) Performed: OPEN REDUCTION INTERNAL FIXATION (ORIF) PROXIMAL HUMERUS FRACTURE (Left Shoulder)  Patient Location: PACU  Anesthesia Type:GA combined with regional for post-op pain  Level of Consciousness: awake, alert , oriented and patient cooperative  Airway & Oxygen Therapy: Patient Spontanous Breathing and Patient connected to face mask oxygen  Post-op Assessment: Report given to RN and Post -op Vital signs reviewed and stable  Post vital signs: Reviewed and stable  Last Vitals:  Vitals:   08/01/17 0111 08/01/17 0552  BP: (!) 141/61 (!) 136/51  Pulse: 90 90  Resp: 18 18  Temp: 37 C 36.8 C  SpO2: 97% 98%    Last Pain:  Vitals:   08/01/17 0553  TempSrc:   PainSc: 5          Complications: No apparent anesthesia complications

## 2017-08-01 NOTE — H&P (Signed)
History and Physical    Danielle Irwin CWU:[id number] DOB: [birth year] DOA: 08/01/2017  PCP: Lucille Passy, MD  Patient coming from: Patient was transferred from [facility].  Chief Complaint: Shoulder pain.  HPI: Danielle Irwin is a 47 y.o. female with history of hypertension, asthma, chronic pain, sleep apnea had a fall at home and struck her left shoulder onto the door frame.  Following which patient had severe pain.  Patient was taken to [facility].  X-rays revealed comminuted left shoulder fracture.  Since patient had severe pain patient was admitted over there but orthopedic surgeon at this time at [facility] feels that patient's surgery is complicated and requested transfer to higher level.  Zacarias Pontes orthopedic surgeon was contacted and patient was accepted to [facility].  Patient was transferred to [facility] and on my exam patient is not in distress.  Denies any chest pain shortness of breath loss of consciousness nausea vomiting headache or visual symptoms.  ED Course: Patient is a direct transfer.  Review of Systems: As per HPI, rest all negative.   Past Medical History:  Diagnosis Date  . Asthma   . Depression   . Family history of adverse reaction to anesthesia    FATHER WAS SLOW TO WAKE UP  . Headache    OCCASIONAL MIGRAINES  . HNP (herniated nucleus pulposus), lumbar   . Hx of epistaxis   . Insomnia   . OA (osteoarthritis)   . Obesity   . PCOS (polycystic ovarian syndrome)   . Sensitive skin   . Vitamin D deficiency     Past Surgical History:  Procedure Laterality Date  . LUMBAR LAMINECTOMY/DECOMPRESSION MICRODISCECTOMY Right 06/02/2014   Procedure: MICRO LUMBAR DECOMPRESSION L4 - L5 ON THE RIGHT 1 LEVEL;  Surgeon: Johnn Hai, MD;  Location: [facility] ORS;  Service: Orthopedics;  Laterality: Right;  . WISDOM TOOTH EXTRACTION       reports that she quit smoking about 27 years ago. she has never used  smokeless tobacco. She reports that she does not drink alcohol or use drugs.  Allergies  Allergen Reactions  . Benzalkonium Chloride Anaphylaxis and Hives  . Neosporin [Neomycin-Bacitracin Zn-Polymyx] Anaphylaxis and Hives  . Bee Venom     REACTION: Hives and Swelling  . Bupropion Hcl     REACTION: Mood Changes  . Cephalexin     REACTION: Hives and Swelling  . Latex     Family History  Problem Relation Age of Onset  . Heart attack Father 40  . Breast cancer Maternal Aunt 50    Prior to Admission medications   Medication Sig Start Date End Date Taking? Authorizing Provider  acetaminophen (TYLENOL) 650 MG CR tablet Take 1,300 mg by mouth every 8 (eight) hours as needed for pain.    [provider]  albuterol (VENTOLIN HFA) 108 (90 Base) MCG/ACT inhaler Inhale 2 puffs into the lungs every 6 (six) hours as needed. 06/24/17   Lucille Passy, MD  Alum & Mag Hydroxide-Simeth (ANTACID ANTI-GAS PO) Take 1 tablet by mouth daily as needed (for gas).    [provider]  aspirin 81 MG tablet Take 81 mg by mouth daily.    [provider]  calcium carbonate (TUMS - DOSED IN MG ELEMENTAL CALCIUM) 500 MG chewable tablet Chew 1 tablet by mouth daily.      [provider]  calcium-vitamin D (OSCAL WITH D) 500-200 MG-UNIT per tablet Take 1 tablet  by mouth daily with breakfast.    [provider]  Cholecalciferol (VITAMIN D) 2000 UNITS CAPS Take 5,000 Units by mouth daily.     [provider]  docusate sodium (COLACE) 100 MG capsule Take 1 capsule (100 mg total) by mouth 2 (two) times daily as needed for mild constipation. 06/02/14   Susa Day, MD  [facility] SEAL PO Take 1 tablet by mouth 2 (two) times daily.    [provider]  hydrochlorothiazide (HYDRODIURIL) 25 MG tablet Take 1 tablet (25 mg total) by mouth daily. 12/24/16   Lucille Passy, MD  ibuprofen (ADVIL,MOTRIN) 800 MG tablet Take 1 tablet (800 mg total) by mouth every 8  (eight) hours as needed. 11/23/14   Pleas Koch, NP  lisinopril (PRINIVIL,ZESTRIL) 10 MG tablet Take 1 tablet (10 mg total) by mouth daily. 06/24/17   Lucille Passy, MD  magnesium oxide (MAG-OX) 400 (241.3 Mg) MG tablet Take 1 tablet (400 mg total) by mouth daily. 08/01/17   Lance Coon, MD  Melatonin 10 MG TABS Take 1 tablet by mouth at bedtime as needed (for sleep).    [provider]  methocarbamol (ROBAXIN) 500 MG tablet Take 1 tablet (500 mg total) by mouth 3 (three) times daily between meals as needed for muscle spasms. 06/02/14   Susa Day, MD  Multiple Vitamin (MULTIVITAMIN) tablet Take 1 tablet by mouth daily.      [provider]  mupirocin ointment (BACTROBAN) 2 % Apply 1 application topically 2 (two) times daily. To affected area 12/28/14   Tower, Wynelle Fanny, MD  norethindrone-ethinyl estradiol-iron (MICROGESTIN FE,GILDESS FE,LOESTRIN FE) 1.5-30 MG-MCG tablet Take 1 tablet by mouth daily.    [provider]  oxyCODONE-acetaminophen (PERCOCET) 7.5-325 MG tablet Take 1 tablet by mouth every 4 (four) hours as needed for severe pain.    [provider]  sertraline (ZOLOFT) 100 MG tablet Take 1 tablet (100 mg total) by mouth daily. 06/24/17   Lucille Passy, MD  silver sulfADIAZINE (SILVADENE) 1 % cream Apply 1 application topically 2 (two) times daily. 11/23/14   Pleas Koch, NP  St Johns Wort 300 MG CAPS Take 3 capsules by mouth 2 (two) times daily.     [provider]  vitamin C (ASCORBIC ACID) 500 MG tablet Take 500 mg by mouth 2 (two) times daily.    [provider]  Zinc Sulfate (ZINC 15 PO) Take by mouth.    [provider]    Physical Exam: Vitals:   08/01/17 0111  BP: (!) 141/61  Pulse: 90  Resp: 18  Temp: 98.6 F (37 C)  TempSrc: Oral  SpO2: 97%      Constitutional: Moderately built and nourished. Vitals:   08/01/17 0111  BP: (!) 141/61  Pulse: 90  Resp: 18  Temp: 98.6 F (37 C)  TempSrc:  Oral  SpO2: 97%   Eyes: Anicteric no pallor. ENMT: No discharge from the ears eyes nose or mouth. Neck: No mass felt.  No neck rigidity. Respiratory: No rhonchi or crepitations. Cardiovascular: S1-S2 heard no murmurs appreciated. Abdomen: Soft nontender bowel sounds present. Musculoskeletal: Left upper extremity is in sling. Skin: No rash. Neurologic: Alert awake oriented to time place and person.  Moves all extremities. Psychiatric: Appears normal.  Normal affect.   Labs on Admission: I have personally reviewed following labs and imaging studies  CBC: Recent Labs  Lab 08/01/17 0250  WBC 11.3*  NEUTROABS 7.4  HGB 11.3*  HCT 34.7*  MCV 94.0  PLT 010   Basic Metabolic Panel: Recent Labs  Lab 08/01/17 0250  NA 137  K 4.3  CL 105  CO2 23  GLUCOSE 152*  BUN 15  CREATININE 0.73  CALCIUM 8.4*   GFR: Estimated Creatinine Clearance: 144.4 mL/min (by C-G formula based on SCr of 0.73 mg/dL). Liver Function Tests: Recent Labs  Lab 08/01/17 0250  AST 26  ALT 35  ALKPHOS 55  BILITOT 1.0  PROT 6.6  ALBUMIN 3.5   No results for input(s): LIPASE, AMYLASE in the last 168 hours. No results for input(s): AMMONIA in the last 168 hours. Coagulation Profile: No results for input(s): INR, PROTIME in the last 168 hours. Cardiac Enzymes: No results for input(s): CKTOTAL, CKMB, CKMBINDEX, TROPONINI in the last 168 hours. BNP (last 3 results) No results for input(s): PROBNP in the last 8760 hours. HbA1C: No results for input(s): HGBA1C in the last 72 hours. CBG: No results for input(s): GLUCAP in the last 168 hours. Lipid Profile: No results for input(s): CHOL, HDL, LDLCALC, TRIG, CHOLHDL, LDLDIRECT in the last 72 hours. Thyroid Function Tests: No results for input(s): TSH, T4TOTAL, FREET4, T3FREE, THYROIDAB in the last 72 hours. Anemia Panel: No results for input(s): VITAMINB12, FOLATE, FERRITIN, TIBC, IRON, RETICCTPCT in the last 72 hours. Urine analysis: No results  found for: COLORURINE, APPEARANCEUR, LABSPEC, PHURINE, GLUCOSEU, HGBUR, BILIRUBINUR, KETONESUR, PROTEINUR, UROBILINOGEN, NITRITE, LEUKOCYTESUR Sepsis Labs: @LABRCNTIP (procalcitonin:4,lacticidven:4) )No results found for this or any previous visit (from the past 240 hour(s)).   Radiological Exams on Admission: Dg Shoulder Left  Result Date: 07/31/2017 CLINICAL DATA:  Fall onto shoulder EXAM: LEFT SHOULDER - 2+ VIEW COMPARISON:  None. FINDINGS: AC joint appears intact. There is an acute comminuted and separated fracture involving the left humeral head, neck and proximal shaft. Superiorly positioned greater tuberosity fracture fragment. Humeral head positioned over the glenoid on Y-view but appears inferiorly positioned on the frontal view. IMPRESSION: Acute, comminuted and displaced fracture involving the left humeral head, neck and proximal shaft with inferior positioning of the left humeral head with respect to the glenoid fossa. Electronically Signed   By: Donavan Foil M.D.   On: 07/31/2017 00:12   Dg Humerus Left  Result Date: 07/31/2017 CLINICAL DATA:  [place] onto left shoulder EXAM: LEFT HUMERUS - 2+ VIEW COMPARISON:  None. FINDINGS: Comminuted and displaced proximal humerus fracture. The mid to distal humerus is intact. IMPRESSION: Acute comminuted and displaced proximal humerus fracture. Electronically Signed   By: Donavan Foil M.D.   On: 07/31/2017 00:10    EKG: Independently reviewed.  Normal sinus rhythm.  Assessment/Plan Principal Problem:   Humerus fracture Active Problems:   HTN (hypertension)   CPAP (continuous positive airway pressure) dependence   Asthma    1. Left proximal humerus comminuted fracture with displacement -Dr. Altamese [place] and Dr. Lennette Bihari Haddix will be seeing patient in consult for surgery.  Patient will be kept n.p.o.  Pain relief medications. 2. Hypertension -since patient is n.p.o. I have placed patient on PRN IV hydralazine.  Restart oral antihypertensives  after surgery when appropriate. 3. History of sleep apnea on CPAP at bedtime. 4. History of asthma presently not wheezing uses as needed nebulizers. 5. Normocytic normochromic anemia -hemoglobin appears to have worsened when compared to one done a few hours ago at [facility] center.  Closely follow CBC.   DVT prophylaxis: SCDs in anticipation of surgery. Code Status: Full code. Family Communication: Discussed with patient. Disposition Plan: To be determined. Consults called: Orthopedic  surgery. Admission status: Inpatient.   Rise Patience MD [facility] Pager [phone].  If 7PM-7AM, please contact night-coverage [email or web address] Password [facility]  08/01/2017, 4:32 AM

## 2017-08-01 NOTE — Op Note (Signed)
OrthopaedicSurgeryOperativeNote (PYP:[id number]) Date of Surgery: 08/01/2017  Admit Date: 08/01/2017   Diagnoses: Pre-Op Diagnoses: Left 4-part proximal humerus fracture Left humeral shaft extension  Post-Op Diagnosis: Same  Procedures: 1. CPT 23615-ORIF of left proximal humerus 2. CPT 24515-ORIF of left humeral shaft  Surgeons: Primary: Kedrick Mcnamee, Thomasene Lot, MD   Assistant: Ainsley Spinner, PA-C  Location:[facility] OR ROOM 03   AnesthesiaGeneral   Antibiotics:Ancef 2g preop  Tourniquettime:None  [id number]:673 mL   Complications:None  Specimens:None  Implants: Implant Name Type Inv. Item Serial No. Manufacturer Lot No. LRB No. Used Action  GRAFT FIBULA 6CM - [id number] Bone Implant GRAFT FIBULA 6CM [id number] MUSCULOSKELETL TRANSPLANT FNDN 3210 Left 1 Implanted  PLATE HUMERUS 2.2LN - [id number] Plate PLATE HUMERUS 7.4YC  [employer]  Left 1 Implanted  SCREW LOCKING 3.5X50 - [id number] Screw SCREW LOCKING 3.5X50  [employer]  Left 1 Implanted  SCREW LOCKING 3.5X28 - [id number] Screw SCREW LOCKING 3.5X28  [employer]  Left 2 Implanted  SCREW LOCKING 3.5X52MM - [id number] Screw SCREW LOCKING 3.5X52MM  [employer]  Left 1 Implanted  SCREW LOCKING 3.5X45 - [id number] Screw SCREW LOCKING 3.5X45  [employer]  Left 1 Implanted  SCREW LOCKING 3.5X40 - [id number] Screw SCREW LOCKING 3.5X40  [employer]  Left 1 Implanted  SCREW LOCKING 3.5X38 - [id number] Screw SCREW LOCKING 3.5X38  [employer]  Left 2 Implanted  SCREW CORTEX 3.5 28MM - [id number] Screw SCREW CORTEX 3.5 28MM  [employer]  Left 1 Implanted  SCREW LOCKING 3.5X52MM - [id number] Screw SCREW LOCKING 3.5X52MM  [employer]  Left 1 Implanted    IndicationsforSurgery: 47 year old morbidly obese female who fell on her left shoulder sustained a 4 part proximal humerus fracture with humeral shaft extension.  She was seen at [facility] at which point Dr. Sabra Heck the orthopedist  there felt that  her injury was beyond capabilities of being treated there and needed an orthopedic traumatologist to take care of her.  As a result she was subsequently transferred to [facility] for me to provide care.  I felt that due to her young age and high activity level that nonoperative treatment was not an option.  I also felt with her significant body habitus that arthroplasty would be in a significantly high risk of infection and failure.  As a result I felt that proceeding with ORIF would be most appropriate. Risks discussed included bleeding requiring blood transfusion, bleeding causing a hematoma, infection, malunion, nonunion, damage to surrounding nerves and blood vessels, pain, hardware prominence or irritation, hardware failure, stiffness, post-traumatic arthritis, DVT/PE, compartment syndrome, and even death. Risks and benefits were extensively discussed as noted above and the patient agreed to proceed with surgery and consent was obtained.  Operative Findings: 1.  Comminuted 4 part proximal humerus fracture with significant displacement and impaction of the humeral head.  Comminution extended down into the humeral shaft including the insertion of the pectoralis major musculature 2.  ORIF of proximal humerus and humeral shaft with a Synthes 5 hole proximal humerus plate.  Supplementation with a 6 cm fibular allograft strut.  Procedure: The patient was identified in the preoperative holding area. Consent was confirmed with the patient and their family and all questions were answered. The operative extremity was marked after confirmation with the patient. The patient was then brought back to the operating room by our anesthesia colleagues.  The patient was placed under general anesthetic and carefully transferred over to a radiolucent flat top table.  A bump was placed under the operative  extremity.  Fluoroscopy was obtained to visualize the injury pattern.The operative extremity was then  prepped and draped in usual sterile fashion. A preoperative timeout was performed to verify the patient, the procedure, and the extremity. Preoperative antibiotics were dosed.  A standard deltopectoral incision was then made.  It was carried down through skin and subcutaneous tissue.  The interval was identified by visualizing the cephalic vein.  This was retracted medially and the interval was entered identifying the fracture.  At this point a portion of the superior pectoralis tendon was cauterized and released.  A retractor was placed under the conjoint tendon as well as the deltoid.  Identified the biceps tendon running into the bicipital groove.  I split the rotator interval up to the glenohumeral joint.  I then took a #2 FiberWire and placed a suture in the subscapularis.  I also placed a #2 FiberWire in the supraspinatus and infraspinatus.  This way I was able to manipulate the tuberosities of the proximal humerus.  Unfortunately she had significant impaction of the humeral head.  I used a bone tamp to enter the surgical neck fracture to elevate the the humeral head back up to an anatomic position.  This was provisionally held with a 1.6 mm K wires.  Once I was pleased with the position of the humeral head, I chose to place a 6 cm fibular allograft in the medullary canal to help reinforce the fixation and prevent varus collapse.  The allograft was slid into the canal and impacted up into the humeral head.  This was provisionally held in place with a clamp. I then chose a 5 hole proximal humerus locking plate.  I placed this on the lateral portion of the humerus just posterior to the biceps tendon.  I then placed a K wire in the most proximal K wire hole to make sure that it would not impinge when she abducted her arm.  I then reduced and clamped the humeral shaft extension.  A nonlocking screw was then placed in the shaft distal to the fibular allograft.  This provisionally held the plate to the bone.  I  placed a unicortical locking screw in the proximal segment to push the fibular graft allograft more medial and better reconstructed the medial calcar.   From here I obtained fluoroscopic images to confirm placement of the plate.  I then proceeded to place a number of locking screws in the proximal segment.  With a few of the screws I was able to get bicortical purchase through the fibular allograft.  I placed a locking screw in the humeral shaft through the fibular allograft as well thereby reinforcing the construct.  Took care not to violate the subchondral bone with my drill when placing my locking screws in the proximal segment.  I confirmed adequate length with fluoroscopic images.  I placed two more nonlocking screws in the shaft to complete the construct.   Final fluoroscopic images were obtained.  My #2 FiberWire sutures in the rotator cuff were passed through the plate and tied down.  I chose to keep the biceps tendon in place and not tenodesis as there is no impingement that I was able to see.  The incision was then copiously irrigated with low pressure pulsatile lavage.  A gram of vancomycin powder 1.2 g of tobramycin powder was placed in the wound.  A layered closure consisting of 0 Vicryl, 2-0 Vicryl and 3-0 nylon was used.  A Mepilex dressing was placed over the incision.  She was then awoken from anesthesia and taken to the PACU in stable condition.  Post Op Plan/Instructions: Patient will be nonweightbearing to the left upper extremity.  She may start gentle pendulum exercises on postoperative day 1 or 2.  She will receive postoperative antibiotics.  I will defer to the primary team regarding DVT prophylaxis.  Due to the upper extremity nature of her injury as well as she is ambulatory I do not feel that it is necessarily warranted in her case.  I will have her return in approximately 2 weeks for suture removal and x-rays.  I was present and performed the entire surgery.  Katha Hamming,  MD [facility]

## 2017-08-02 DIAGNOSIS — I10 Essential (primary) hypertension: Secondary | ICD-10-CM | POA: Diagnosis not present

## 2017-08-02 DIAGNOSIS — S42202D Unspecified fracture of upper end of left humerus, subsequent encounter for fracture with routine healing: Secondary | ICD-10-CM | POA: Diagnosis not present

## 2017-08-02 DIAGNOSIS — Z9989 Dependence on other enabling machines and devices: Secondary | ICD-10-CM

## 2017-08-02 DIAGNOSIS — S42202A Unspecified fracture of upper end of left humerus, initial encounter for closed fracture: Secondary | ICD-10-CM | POA: Diagnosis not present

## 2017-08-02 DIAGNOSIS — S42292A Other displaced fracture of upper end of left humerus, initial encounter for closed fracture: Secondary | ICD-10-CM | POA: Diagnosis not present

## 2017-08-02 LAB — BASIC METABOLIC PANEL
Anion gap: 10 (ref 5–15)
BUN: 8 mg/dL (ref 6–20)
CALCIUM: 8.2 mg/dL — AB (ref 8.9–10.3)
CO2: 26 mmol/L (ref 22–32)
Chloride: 101 mmol/L (ref 101–111)
Creatinine, Ser: 0.69 mg/dL (ref 0.44–1.00)
GFR calc Af Amer: >60 mL/min (ref >60)
GLUCOSE: 132 mg/dL — AB (ref 65–99)
Potassium: 4.1 mmol/L (ref 3.5–5.1)
Sodium: 137 mmol/L (ref 135–145)

## 2017-08-02 LAB — CBC
HCT: 28.9 % — ABNORMAL LOW (ref 36.0–46.0)
Hemoglobin: 9.2 g/dL — ABNORMAL LOW (ref 12.0–15.0)
MCH: 29.9 pg (ref 26.0–34.0)
MCHC: 31.8 g/dL (ref 30.0–36.0)
MCV: 93.8 fL (ref 78.0–100.0)
PLATELETS: 236 10*3/uL (ref 150–400)
RBC: 3.08 MIL/uL — ABNORMAL LOW (ref 3.87–5.11)
RDW: 13.1 % (ref 11.5–15.5)
WBC: 14.3 10*3/uL — AB (ref 4.0–10.5)

## 2017-08-02 LAB — IRON AND TIBC
Iron: 38 ug/dL (ref 28–170)
Saturation Ratios: 14 % (ref 10.4–31.8)
TIBC: 279 ug/dL (ref 250–450)
UIBC: 241 ug/dL

## 2017-08-02 LAB — HIV ANTIBODY (ROUTINE TESTING W REFLEX): HIV Screen 4th Generation wRfx: NONREACTIVE

## 2017-08-02 LAB — GLUCOSE, CAPILLARY
GLUCOSE-CAPILLARY: 167 mg/dL — AB (ref 65–99)
GLUCOSE-CAPILLARY: 154 mg/dL — AB (ref 65–99)
Glucose-Capillary: 198 mg/dL — ABNORMAL HIGH (ref 65–99)
Glucose-Capillary: 119 mg/dL — ABNORMAL HIGH (ref 65–99)

## 2017-08-02 LAB — FERRITIN: Ferritin: 60 ng/mL (ref 11–307)

## 2017-08-02 MED ORDER — SENNOSIDES-DOCUSATE SODIUM 8.6-50 MG PO TABS
1.0000 | ORAL_TABLET | Freq: Two times a day (BID) | ORAL | Status: DC
  Administered 2017-08-02 – 2017-08-03 (×2): 1 via ORAL
  Filled 2017-08-02 (×2): qty 1

## 2017-08-02 MED ORDER — METHOCARBAMOL 750 MG PO TABS: 750.0000 mg | ORAL_TABLET | Freq: Three times a day (TID) | ORAL | 0 refills | Status: DC | PRN

## 2017-08-02 MED ORDER — HYDROCODONE-ACETAMINOPHEN 7.5-325 MG PO TABS: 1.0000 | ORAL_TABLET | ORAL | 0 refills | Status: DC | PRN

## 2017-08-02 MED ORDER — LISINOPRIL 10 MG PO TABS
10.0000 mg | ORAL_TABLET | Freq: Every day | ORAL | Status: DC
  Administered 2017-08-02 – 2017-08-03 (×2): 10 mg via ORAL
  Filled 2017-08-02 (×2): qty 1

## 2017-08-02 MED ORDER — POLYETHYLENE GLYCOL 3350 17 G PO PACK
17.0000 g | PACK | Freq: Two times a day (BID) | ORAL | Status: DC
  Administered 2017-08-02 – 2017-08-03 (×2): 17 g via ORAL
  Filled 2017-08-02 (×2): qty 1

## 2017-08-02 NOTE — Progress Notes (Signed)
RT entered room to place patient on CPAP. Patient states she can place self on and off when ready. RT informed patient if she has any trouble have RN contact RT.

## 2017-08-02 NOTE — Evaluation (Addendum)
Physical Therapy Evaluation Patient Details Name: Danielle Irwin MRN: [id number] DOB: 16-Mar-1971 Today's Date: 08/02/2017   History of Present Illness  Pt is a 47 y/o female who presents s/p fall sustaining a L proximal humerus fx s/p ORIF on 08/01/17. PMH significant for DDD, HNP (lumbar), asthma.   Clinical Impression  Pt admitted with above diagnosis. Pt currently with functional limitations due to the deficits listed below (see PT Problem List). At the time of PT eval pt was able to perform transfers with gross supervision for safety and occasional min guard assist for mild imbalance during gait training. Pt was educated on positioning and general safety with mobility at this time. Balance deficits appear to be exacerbated with immobility of LUE, however pt with several falls reported due to tripping up stairs or stepping up into high garden tub. Pt will benefit from skilled PT to increase their independence and safety with mobility to allow discharge to the venue listed below.     Follow Up Recommendations Outpatient PT;Supervision for mobility/OOB    Equipment Recommendations  None recommended by PT    Recommendations for Other Services       Precautions / Restrictions Precautions Precautions: Fall Restrictions Weight Bearing Restrictions: Yes LUE Weight Bearing: Non weight bearing      Mobility  Bed Mobility Overal bed mobility: Needs Assistance Bed Mobility: Rolling;Sidelying to Sit;Sit to Sidelying;Supine to Sit;Sit to Supine Rolling: Supervision Sidelying to sit: Supervision Supine to sit: Supervision Sit to supine: Supervision Sit to sidelying: Supervision General bed mobility comments: Pt attempted supine<>sit with the log roll technique as well as getting into long sitting and lowering trunk. Will have hospital bed at home and getting into long sitting first appeared to be the easiest way for her. Heavy use of rail and increased time required for all aspects of bed  mobility.   Transfers Overall transfer level: Needs assistance Equipment used: None Transfers: Sit to/from Stand Sit to Stand: Supervision         General transfer comment: VC's for hand placement on seated surface for safety. No assist required.   Ambulation/Gait Ambulation/Gait assistance: Min guard;Supervision Ambulation Distance (Feet): 250 Feet Assistive device: None Gait Pattern/deviations: Step-through pattern;Wide base of support Gait velocity: Decreased Gait velocity interpretation: Below normal speed for age/gender General Gait Details: Slow with occasional unsteadiness (2-3 mild losses of balance) during turns. Pt required min guard but was able to recover without assistance.   Stairs            Wheelchair Mobility    Modified Rankin (Stroke Patients Only)       Balance Overall balance assessment: Needs assistance Sitting-balance support: Feet supported;No upper extremity supported Sitting balance-Leahy Scale: Good     Standing balance support: During functional activity;No upper extremity supported Standing balance-Leahy Scale: Poor Standing balance comment: Occasional LOB during ambulation                             Pertinent Vitals/Pain Pain Assessment: 0-10 Pain Score: 7  Pain Location: LUE Pain Descriptors / Indicators: Operative site guarding Pain Intervention(s): Limited activity within patient's tolerance;Monitored during session;Repositioned    Home Living Family/patient expects to be discharged to:: Private residence Living Arrangements: Alone Available Help at Discharge: Family;Available 24 hours/day Type of Home: House Home Access: Ramped entrance     Home Layout: One level Home Equipment: Toilet riser;Walker - 2 wheels;Cane - single point;Hospital bed Additional Comments: Above information is for  parent's house which pt is planning to d/c to until she can be independent at home.     Prior Function Level of  Independence: Independent         Comments: Currently works at [employer].      Hand Dominance   Dominant Hand: Right    Extremity/Trunk Assessment   Upper Extremity Assessment Upper Extremity Assessment: LUE deficits/detail LUE Deficits / Details: Immobilized in sling. OT to assess further    Lower Extremity Assessment Lower Extremity Assessment: Generalized weakness(Pt reports pain in bilateral knees)    Cervical / Trunk Assessment Cervical / Trunk Assessment: Normal  Communication   Communication: No difficulties  Cognition Arousal/Alertness: Awake/alert Behavior During Therapy: WFL for tasks assessed/performed Overall Cognitive Status: Within Functional Limits for tasks assessed                                        General Comments      Exercises     Assessment/Plan    PT Assessment Patient needs continued PT services  PT Problem List Decreased strength;Decreased range of motion;Decreased balance;Decreased activity tolerance;Decreased mobility;Decreased knowledge of use of DME;Decreased safety awareness;Decreased knowledge of precautions;Pain       PT Treatment Interventions DME instruction;Gait training;Stair training;Functional mobility training;Therapeutic activities;Therapeutic exercise;Neuromuscular re-education;Patient/family education    PT Goals (Current goals can be found in the Care Plan section)  Acute Rehab PT Goals Patient Stated Goal: Home with parents at d/c PT Goal Formulation: With patient Time For Goal Achievement: 08/09/17 Potential to Achieve Goals: Good    Frequency Min 3X/week   Barriers to discharge        Co-evaluation               AM-PAC PT "6 Clicks" Daily Activity  Outcome Measure Difficulty turning over in bed (including adjusting bedclothes, sheets and blankets)?: Unable Difficulty moving from lying on back to sitting on the side of the bed? : Unable Difficulty sitting down on and standing up  from a chair with arms (e.g., wheelchair, bedside commode, etc,.)?: A Little Help needed moving to and from a bed to chair (including a wheelchair)?: A Little Help needed walking in hospital room?: A Little Help needed climbing 3-5 steps with a railing? : A Lot 6 Click Score: 13    End of Session Equipment Utilized During Treatment: Gait belt;Other (comment)(Sling LUE) Activity Tolerance: Patient tolerated treatment well Patient left: in chair;with call bell/phone within reach Nurse Communication: Mobility status;Precautions PT Visit Diagnosis: Unsteadiness on feet (R26.81);Pain Pain - Right/Left: Left Pain - part of body: Arm;Shoulder    Time: 1130-1208 PT Time Calculation (min) (ACUTE ONLY): 38 min   Charges:   PT Evaluation $PT Eval Moderate Complexity: 1 Mod PT Treatments $Gait Training: 23-37 mins   PT G Codes:        Rolinda Roan, PT, DPT Acute Rehabilitation Services Pager: [phone]   Thelma Comp 08/02/2017, 1:12 PM

## 2017-08-02 NOTE — Plan of Care (Signed)
  Progressing Education: Knowledge of General Education information will improve 08/02/2017 1016 - Progressing by Rance Muir, RN Health Behavior/Discharge Planning: Ability to manage health-related needs will improve 08/02/2017 1016 - Progressing by Rance Muir, RN Clinical Measurements: Ability to maintain clinical measurements within normal limits will improve 08/02/2017 1016 - Progressing by Rance Muir, RN Will remain free from infection 08/02/2017 1016 - Progressing by Rance Muir, RN Diagnostic test results will improve 08/02/2017 1016 - Progressing by Rance Muir, RN Respiratory complications will improve 08/02/2017 1016 - Progressing by Rance Muir, RN Cardiovascular complication will be avoided 08/02/2017 1016 - Progressing by Rance Muir, RN Activity: Risk for activity intolerance will decrease 08/02/2017 1016 - Progressing by Rance Muir, RN Nutrition: Adequate nutrition will be maintained 08/02/2017 1016 - Progressing by Rance Muir, RN Coping: Level of anxiety will decrease Description Patient calm at this time.  08/02/2017 1016 - Progressing by Rance Muir, RN Elimination: Will not experience complications related to bowel motility 08/02/2017 1016 - Progressing by Rance Muir, RN Will not experience complications related to urinary retention 08/02/2017 1016 - Progressing by Rance Muir, RN Pain Managment: General experience of comfort will improve 08/02/2017 1016 - Progressing by Rance Muir, RN Safety: Ability to remain free from injury will improve 08/02/2017 1016 - Progressing by Rance Muir, RN Skin Integrity: Risk for impaired skin integrity will decrease 08/02/2017 1016 - Progressing by Rance Muir, RN

## 2017-08-02 NOTE — Evaluation (Signed)
Occupational Therapy Evaluation and Discharge  Patient Details Name: Danielle Irwin MRN: [id number] DOB: 14-Dec-1970 Today's Date: 08/02/2017    History of Present Illness Pt is a 47 y/o female who presents s/p fall sustaining a L proximal humerus fx s/p ORIF on 08/01/17. PMH significant for DDD, HNP (lumbar), asthma.    Clinical Impression   PTA Pt independent in ADL and mobility; working at [employer]. Pt is currently max A for UB ADL limited by pain and immobilization - but able to verbalize instructions to caregiver for assist. Pt plans on dc to parents house (father with Parkinsons and mother with health concerns) but brother and his wife involved and can help/assist PRN. Pt educated and provided handout for OT conservative protocol with exercises for elbow, wrist, digits, and gentle pendulums. Pt able to demonstrate understanding. Pt able to verbalize teach back methods in compensatory strategies for ADL (and also has written reference in shoulder handout). Pt concerned about peri care and educated with toilet aide. OT education complete. No further questions or concerns for OT at the end of session from Pt or sister-in-law who was present throughout session. OT to sign off at this time, thank you for the opportunity to serve this patient. Please progress shoulder therapy as determined by MD at follow up.     Follow Up Recommendations  Follow surgeon's recommendation for DC plan and follow-up therapies    Equipment Recommendations  None recommended by OT    Recommendations for Other Services       Precautions / Restrictions Precautions Precautions: Fall;Shoulder Type of Shoulder Precautions: Conservative protocol Shoulder Interventions: Shoulder sling/immobilizer;Off for dressing/bathing/exercises Precaution Booklet Issued: Yes (comment) Precaution Comments: Shoulder Protocol Handout provided and reviewed in full Required Braces or Orthoses: Sling Restrictions Weight Bearing  Restrictions: Yes LUE Weight Bearing: Non weight bearing      Mobility Bed Mobility               General bed mobility comments: Pt sitting OOB in recliner when OT arrived  Transfers Overall transfer level: Needs assistance Equipment used: None Transfers: Sit to/from Stand Sit to Stand: Supervision         General transfer comment: VC's for hand placement on seated surface for safety. No assist required.     Balance Overall balance assessment: Needs assistance Sitting-balance support: Feet supported;No upper extremity supported Sitting balance-Leahy Scale: Good     Standing balance support: During functional activity;No upper extremity supported Standing balance-Leahy Scale: Fair                             ADL either performed or assessed with clinical judgement   ADL Overall ADL's : Needs assistance/impaired                         Toilet Transfer: Min guard;Ambulation   Toileting- Clothing Manipulation and Hygiene: Moderate assistance;Sit to/from stand Toileting - Clothing Manipulation Details (indicate cue type and reason): Pt very concerned about peri care due to body habitus. Pt educated on toilet aide and how to use it.      Functional mobility during ADLs: Min guard General ADL Comments: Please see shoulder section below for more detailed information regarding ADL and shoulder protocol     Vision Patient Visual Report: No change from baseline       Perception     Praxis      Pertinent Vitals/Pain Pain Assessment: 0-10  Pain Score: 5  Pain Location: LUE Pain Descriptors / Indicators: Operative site guarding;Grimacing;Discomfort;Tightness Pain Intervention(s): Monitored during session;Repositioned;Ice applied     Hand Dominance Right   Extremity/Trunk Assessment Upper Extremity Assessment Upper Extremity Assessment: LUE deficits/detail LUE Deficits / Details: s/p sx - followed conservative protocol LUE Sensation:  WNL LUE Coordination: decreased gross motor   Lower Extremity Assessment Lower Extremity Assessment: Defer to PT evaluation   Cervical / Trunk Assessment Cervical / Trunk Assessment: Normal   Communication Communication Communication: No difficulties   Cognition Arousal/Alertness: Awake/alert Behavior During Therapy: WFL for tasks assessed/performed Overall Cognitive Status: Within Functional Limits for tasks assessed                                     General Comments  Pt's sister in law present during evaluation and education    Exercises Exercises: Shoulder Shoulder Exercises Pendulum Exercise: AROM;Left;Standing(using counter for support - GENTLE) Elbow Flexion: AROM;Left;10 reps;Seated Elbow Extension: AROM;Left;10 reps;Seated Wrist Flexion: AROM;Left;10 reps Wrist Extension: AROM;Left Digit Composite Flexion: AROM;Left Composite Extension: AROM;Left Neck Flexion: AROM Neck Extension: AROM Neck Lateral Flexion - Right: AROM Neck Lateral Flexion - Left: AROM   Shoulder Instructions Shoulder Instructions Donning/doffing shirt without moving shoulder: Maximal assistance;Patient able to independently direct caregiver Method for sponge bathing under operated UE: Moderate assistance;Patient able to independently direct caregiver Donning/doffing sling/immobilizer: Maximal assistance;Patient able to independently direct caregiver Correct positioning of sling/immobilizer: Maximal assistance;Patient able to independently direct caregiver Pendulum exercises (written home exercise program): Supervision/safety ROM for elbow, wrist and digits of operated UE: Modified independent Sling wearing schedule (on at all times/off for ADL's): Modified independent Proper positioning of operated UE when showering: Modified independent Positioning of UE while sleeping: Moderate assistance;Patient able to independently direct caregiver    Home Living Family/patient expects to  be discharged to:: Private residence Living Arrangements: Parent;Other relatives(brother and sister-in-law) Available Help at Discharge: Family;Available 24 hours/day Type of Home: House Home Access: Ramped entrance     Home Layout: One level     Bathroom Shower/Tub: Tub/shower unit;Walk-in shower   Bathroom Toilet: Standard     Home Equipment: Toilet riser;Walker - 2 wheels;Cane - single point;Hospital bed   Additional Comments: Above information is for parent's house which pt is planning to d/c to until she can be independent at home.       Prior Functioning/Environment Level of Independence: Independent        Comments: Currently works at [employer].         OT Problem List:        OT Treatment/Interventions:      OT Goals(Current goals can be found in the care plan section) Acute Rehab OT Goals Patient Stated Goal: Home with parents at d/c OT Goal Formulation: With patient Time For Goal Achievement: 08/16/17 Potential to Achieve Goals: Good  OT Frequency:     Barriers to D/C:            Co-evaluation              AM-PAC PT "6 Clicks" Daily Activity     Outcome Measure Help from another person eating meals?: A Little Help from another person taking care of personal grooming?: A Little Help from another person toileting, which includes using toliet, bedpan, or urinal?: A Little Help from another person bathing (including washing, rinsing, drying)?: A Little Help from another person to put on and taking off  regular upper body clothing?: A Lot Help from another person to put on and taking off regular lower body clothing?: A Lot 6 Click Score: 16   End of Session Equipment Utilized During Treatment: Other (comment)(sling) Nurse Communication: Mobility status;Other (comment)(Pt asking about when she is able to go home)  Activity Tolerance: Patient tolerated treatment well Patient left: in chair;with call bell/phone within reach;with family/visitor  present                   Time: 4492-0100 OT Time Calculation (min): 39 min Charges:  OT General Charges $OT Visit: 1 Visit OT Evaluation $OT Eval Moderate Complexity: 1 Mod OT Treatments $Self Care/Home Management : 8-22 mins G-Codes:     Hulda Humphrey OTR/L [place] 08/02/2017, 4:17 PM

## 2017-08-02 NOTE — Discharge Instructions (Signed)
Orthopaedic Trauma Service Discharge Instructions   General Discharge Instructions  WEIGHT BEARING STATUS: Non weightbearing Left arm  RANGE OF MOTION/ACTIVITY: Gentle pendulums and active assisted range of motion of shoulder. Active and passive range of motion of elbow, wrist and fingers  Wound Care: Remove on postoperative day 3 (Monday) and then can leave open to air if no drainge  DVT/PE prophylaxis: None needed from orthopaedic perspective, continue daily 81mg  aspirin  Diet: as you were eating previously.  Can use over the counter stool softeners and bowel preparations, such as Miralax, to help with bowel movements.  Narcotics can be constipating.  Be sure to drink plenty of fluids  PAIN MEDICATION USE AND EXPECTATIONS  You have likely been given narcotic medications to help control your pain.  After a traumatic event that results in an fracture (broken bone) with or without surgery, it is ok to use narcotic pain medications to help control one's pain.  We understand that everyone responds to pain differently and each individual patient will be evaluated on a regular basis for the continued need for narcotic medications. Ideally, narcotic medication use should last no more than 6-8 weeks (coinciding with fracture healing).   As a patient it is your responsibility as well to monitor narcotic medication use and report the amount and frequency you use these medications when you come to your office visit.   We would also advise that if you are using narcotic medications, you should take a dose prior to therapy to maximize you participation.  IF YOU ARE ON NARCOTIC MEDICATIONS IT IS NOT PERMISSIBLE TO OPERATE A MOTOR VEHICLE (MOTORCYCLE/CAR/TRUCK/MOPED) OR HEAVY MACHINERY DO NOT MIX NARCOTICS WITH OTHER CNS (CENTRAL NERVOUS SYSTEM) DEPRESSANTS SUCH AS ALCOHOL   STOP SMOKING OR USING NICOTINE PRODUCTS!!!!  As discussed nicotine severely impairs your body's ability to heal surgical and  traumatic wounds but also impairs bone healing.  Wounds and bone heal by forming microscopic blood vessels (angiogenesis) and nicotine is a vasoconstrictor (essentially, shrinks blood vessels).  Therefore, if vasoconstriction occurs to these microscopic blood vessels they essentially disappear and are unable to deliver necessary nutrients to the healing tissue.  This is one modifiable factor that you can do to dramatically increase your chances of healing your injury.    (This means no smoking, no nicotine gum, patches, etc)  DO NOT USE NONSTEROIDAL ANTI-INFLAMMATORY DRUGS (NSAID'S)  Using products such as Advil (ibuprofen), Aleve (naproxen), Motrin (ibuprofen) for additional pain control during fracture healing can delay and/or prevent the healing response.  If you would like to take over the counter (OTC) medication, Tylenol (acetaminophen) is ok.  However, some narcotic medications that are given for pain control contain acetaminophen as well. Therefore, you should not exceed more than 4000 mg of tylenol in a day if you do not have liver disease.  Also note that there are may OTC medicines, such as cold medicines and allergy medicines that my contain tylenol as well.  If you have any questions about medications and/or interactions please ask your doctor/PA or your pharmacist.      ICE AND ELEVATE INJURED/OPERATIVE EXTREMITY  Using ice and elevating the injured extremity above your heart can help with swelling and pain control.  Icing in a pulsatile fashion, such as 20 minutes on and 20 minutes off, can be followed.    Do not place ice directly on skin. Make sure there is a barrier between to skin and the ice pack.    Using frozen items such as  frozen peas works well as the conform nicely to the are that needs to be iced.  USE AN ACE WRAP OR TED HOSE FOR SWELLING CONTROL  In addition to icing and elevation, Ace wraps or TED hose are used to help limit and resolve swelling.  It is recommended to use  Ace wraps or TED hose until you are informed to stop.    When using Ace Wraps start the wrapping distally (farthest away from the body) and wrap proximally (closer to the body)   Example: If you had surgery on your leg or thing and you do not have a splint on, start the ace wrap at the toes and work your way up to the thigh        If you had surgery on your upper extremity and do not have a splint on, start the ace wrap at your fingers and work your way up to the upper arm  [place]: [phone]     Discharge Wound Care Instructions  Do NOT apply any ointments, solutions or lotions to pin sites or surgical wounds.  These prevent needed drainage and even though solutions like hydrogen peroxide kill bacteria, they also damage cells lining the pin sites that help fight infection.  Applying lotions or ointments can keep the wounds moist and can cause them to breakdown and open up as well. This can increase the risk for infection. When in doubt call the office.  Surgical incisions should be dressed daily.  If any drainage is noted, use one layer of adaptic, then gauze, Kerlix, and an ace wrap.  Once the incision is completely dry and without drainage, it may be left open to air out.  Showering may begin 36-48 hours later.  Cleaning gently with soap and water.  Traumatic wounds should be dressed daily as well.    One layer of adaptic, gauze, Kerlix, then ace wrap.  The adaptic can be discontinued once the draining has ceased    If you have a wet to dry dressing: wet the gauze with saline the squeeze as much saline out so the gauze is moist (not soaking wet), place moistened gauze over wound, then place a dry gauze over the moist one, followed by Kerlix wrap, then ace wrap.

## 2017-08-02 NOTE — Progress Notes (Signed)
PROGRESS NOTE  [name]:[id number] DOB: [birth year] DOA: 08/01/2017 PCP: Lucille Passy, MD  HPI/Recap of past 24 hours: Danielle Irwin is a 47 y.o. female with history of hypertension, asthma, chronic pain, sleep apnea had a mechanical fall at home and struck her left arm onto the door frame. Admitted for fracture of her L proximal humerus. Ortho was consulted and pt had ORIF of L humerus on 08/01/17.  Today, pt reports feeling more pain as nerve block has worn off. Denies any chest pain, SOB, abdominal pain, N/V, fever/chills.   Assessment/Plan: Principal Problem:   Humerus fracture Active Problems:   HTN (hypertension)   CPAP (continuous positive airway pressure) dependence   Asthma  Left proximal humerus fracture with displacement S/p ORIF of L humerus on 08/01/17 Management by ortho PT/OT  Normocytic anemia Likely due to post op   Leukocytosis Afebrile Likely reactive, post op Repeat CBC in am  HTN Controlled Continue lisinopril, held HCT  OSA CPAP    Code Status: Full  Family Communication: None at bedside   Disposition Plan: Home by 08/03/17    Consultants:  Orthopedics  Procedures:  L ORIF on 08/01/17  Antimicrobials:  None  DVT prophylaxis:  Lovenox   Objective: Vitals:   08/01/17 2348 08/02/17 0052 08/02/17 0404 08/02/17 0747  BP: 140/63  140/68 132/62  Pulse: 90 76 79 82  Resp: 17 18 16 17   Temp: 98.4 F (36.9 C)  97.6 F (36.4 C) 98.1 F (36.7 C)  TempSrc: Oral  Axillary Oral  SpO2: 97% 98% 99% 100%  Weight:      Height:        Intake/Output Summary (Last 24 hours) at 08/02/2017 1507 Last data filed at 08/02/2017 0750 Gross per 24 hour  Intake 760 ml  Output 2300 ml  Net -1540 ml   Filed Weights   08/01/17 0553  Weight: (!) 171.2 kg (377 lb 6.8 oz)    Exam:   General:  NAD   Cardiovascular: S1, S2   Respiratory: CTA  Abdomen: Obese, NT, ND, BS+   Musculoskeletal: LUE in ace wrap. No pedal edema b/l     Skin: Normal  Psychiatry: Normal mood   Data Reviewed: CBC: Recent Labs  Lab 08/01/17 0250 08/01/17 1131 08/02/17 0524  WBC 11.3*  --  14.3*  NEUTROABS 7.4  --   --   HGB 11.3* 9.9* 9.2*  HCT 34.7* 29.0* 28.9*  MCV 94.0  --  93.8  PLT 238  --  939   Basic Metabolic Panel: Recent Labs  Lab 08/01/17 0250 08/01/17 1131 08/02/17 0524  NA 137 140 137  K 4.3 4.3 4.1  CL 105  --  101  CO2 23  --  26  GLUCOSE 152* 151* 132*  BUN 15  --  8  CREATININE 0.73  --  0.69  CALCIUM 8.4*  --  8.2*   GFR: Estimated Creatinine Clearance: 142.9 mL/min (by C-G formula based on SCr of 0.69 mg/dL). Liver Function Tests: Recent Labs  Lab 08/01/17 0250  AST 26  ALT 35  ALKPHOS 55  BILITOT 1.0  PROT 6.6  ALBUMIN 3.5   No results for input(s): LIPASE, AMYLASE in the last 168 hours. No results for input(s): AMMONIA in the last 168 hours. Coagulation Profile: No results for input(s): INR, PROTIME in the last 168 hours. Cardiac Enzymes: No results for input(s): CKTOTAL, CKMB, CKMBINDEX, TROPONINI in the last 168 hours. BNP (last 3 results) No results for  input(s): PROBNP in the last 8760 hours. HbA1C: No results for input(s): HGBA1C in the last 72 hours. CBG: Recent Labs  Lab 08/01/17 2329 08/02/17 0833 08/02/17 1210  GLUCAP 198* 167* 119*   Lipid Profile: No results for input(s): CHOL, HDL, LDLCALC, TRIG, CHOLHDL, LDLDIRECT in the last 72 hours. Thyroid Function Tests: No results for input(s): TSH, T4TOTAL, FREET4, T3FREE, THYROIDAB in the last 72 hours. Anemia Panel: Recent Labs    08/02/17 0524  FERRITIN 60  TIBC 279  IRON 38   Urine analysis: No results found for: COLORURINE, APPEARANCEUR, LABSPEC, PHURINE, GLUCOSEU, HGBUR, BILIRUBINUR, KETONESUR, PROTEINUR, UROBILINOGEN, NITRITE, LEUKOCYTESUR Sepsis Labs: @LABRCNTIP (procalcitonin:4,lacticidven:4)  ) Recent Results (from the past 240 hour(s))  MRSA PCR Screening     Status: None   Collection Time:  08/01/17  6:02 AM  Result Value Ref Range Status   MRSA by PCR NEGATIVE NEGATIVE Final    Comment:        The GeneXpert MRSA Assay (FDA approved for NASAL specimens only), is one component of a comprehensive MRSA colonization surveillance program. It is not intended to diagnose MRSA infection nor to guide or monitor treatment for MRSA infections. Performed at [facility], [place] [address], [place], [place] [address]       Studies: No results found.  Scheduled Meds: . docusate sodium  100 mg Oral BID  . enoxaparin (LOVENOX) injection  0.5 mg/kg Subcutaneous Q24H    Continuous Infusions: . lactated ringers 10 mL/hr at 08/01/17 0806  . methocarbamol (ROBAXIN)  IV       LOS: 1 day     Alma Friendly, MD [facility]   If 7PM-7AM, please contact night-coverage [email or web address] Password [facility] 08/02/2017, 3:07 PM

## 2017-08-02 NOTE — Progress Notes (Signed)
Orthopaedic Trauma Progress Note  S: Doing okay, block worn off. Pain controlled. Sitting in chair this AM  O:  Vitals:   08/02/17 0404 08/02/17 0747  BP: 140/68 132/62  Pulse: 79 82  Resp: 16 17  Temp: 97.6 F (36.4 C) 98.1 F (36.7 C)  SpO2: 99% 100%   Gen: NAD, AAOx3, cooperative and pleasant LUE: Dressing with minimal strikethrough, Swollen LUE, Median, radial and ulnar nerve intact. Warm and well perfused hand  Labs:  CBC    Component Value Date/Time   WBC 14.3 (H) 08/02/2017 0524   RBC 3.08 (L) 08/02/2017 0524   HGB 9.2 (L) 08/02/2017 0524   HCT 28.9 (L) 08/02/2017 0524   PLT 236 08/02/2017 0524   MCV 93.8 08/02/2017 0524   MCH 29.9 08/02/2017 0524   MCHC 31.8 08/02/2017 0524   RDW 13.1 08/02/2017 0524   LYMPHSABS 2.9 08/01/2017 0250   MONOABS 0.9 08/01/2017 0250   EOSABS 0.1 08/01/2017 0250   BASOSABS 0.0 08/01/2017 02512    A/P: 47 year old female with 4 part proximal humerus fracture  -NWB -OT today -Pain control -May remove dressing POD3 -Dispo: TBD, likely home, okay to discharge today from orthopaedic perspective  Shona Needles, MD [facility] [phone] (phone)

## 2017-08-03 DIAGNOSIS — S42202D Unspecified fracture of upper end of left humerus, subsequent encounter for fracture with routine healing: Secondary | ICD-10-CM

## 2017-08-03 DIAGNOSIS — S42292A Other displaced fracture of upper end of left humerus, initial encounter for closed fracture: Secondary | ICD-10-CM | POA: Diagnosis not present

## 2017-08-03 DIAGNOSIS — I10 Essential (primary) hypertension: Secondary | ICD-10-CM | POA: Diagnosis not present

## 2017-08-03 LAB — CBC WITH DIFFERENTIAL/PLATELET
BASOS ABS: 0 10*3/uL (ref 0.0–0.1)
BASOS PCT: 0 %
EOS ABS: 0.2 10*3/uL (ref 0.0–0.7)
EOS PCT: 1 %
HCT: 25.6 % — ABNORMAL LOW (ref 36.0–46.0)
Hemoglobin: 8.4 g/dL — ABNORMAL LOW (ref 12.0–15.0)
Lymphocytes Relative: 41 %
Lymphs Abs: 4.9 10*3/uL — ABNORMAL HIGH (ref 0.7–4.0)
MCH: 31 pg (ref 26.0–34.0)
MCHC: 32.8 g/dL (ref 30.0–36.0)
MCV: 94.5 fL (ref 78.0–100.0)
MONO ABS: 0.8 10*3/uL (ref 0.1–1.0)
MONOS PCT: 7 %
Neutro Abs: 6 10*3/uL (ref 1.7–7.7)
Neutrophils Relative %: 51 %
PLATELETS: 234 10*3/uL (ref 150–400)
RBC: 2.71 MIL/uL — ABNORMAL LOW (ref 3.87–5.11)
RDW: 13.4 % (ref 11.5–15.5)
WBC: 11.9 10*3/uL — ABNORMAL HIGH (ref 4.0–10.5)

## 2017-08-03 LAB — GLUCOSE, CAPILLARY
GLUCOSE-CAPILLARY: 242 mg/dL — AB (ref 65–99)
Glucose-Capillary: 117 mg/dL — ABNORMAL HIGH (ref 65–99)

## 2017-08-03 MED ORDER — POLYETHYLENE GLYCOL 3350 17 G PO PACK: 17.0000 g | PACK | Freq: Every day | ORAL | 0 refills | Status: DC | PRN

## 2017-08-03 MED ORDER — SENNOSIDES-DOCUSATE SODIUM 8.6-50 MG PO TABS: 1.0000 | ORAL_TABLET | Freq: Every evening | ORAL | 0 refills | Status: DC | PRN

## 2017-08-03 NOTE — Care Management Note (Signed)
47 yo F who sustained a fall and a L proximal humerus fx, s/p ORIF. Received referral to assist pt with [facility] and DME. PT is recommending outpt PT. No DME recommended. Met with pt. She plans to stay with her parents at time of D/C. She denies any D/C needs. RN to contact MD to discuss outpt rehab.

## 2017-08-03 NOTE — Discharge Summary (Signed)
Discharge Summary  Danielle Irwin ZOX:[id number] DOB: 03/17/1971  PCP: Danielle Passy, MD  Admit date: 08/01/2017 Discharge date: 08/03/2017  Time spent: < 30 mins  Recommendations for Outpatient Follow-up:  1. PCP 2. Orthopedics 3. Outpatient PT   Discharge Diagnoses:  Active Hospital Problems   Diagnosis Date Noted  . Humerus fracture 08/01/2017  . CPAP (continuous positive airway pressure) dependence 08/01/2017  . Asthma 08/01/2017  . HTN (hypertension) 06/24/2017    Resolved Hospital Problems  No resolved problems to display.    Discharge Condition: Stable  Diet recommendation: Heart healthy  Vitals:   08/03/17 0404 08/03/17 1508  BP: (!) 126/55 (!) 109/57  Pulse: 78 (!) 104  Resp: 17 18  Temp: 97.6 F (36.4 C) 98.3 F (36.8 C)  SpO2: 100% 98%    History of present illness:  Danielle M Reavisis a 46 y.o.femalewithhistory of hypertension, asthma, chronic pain, sleep apnea had a mechanical fall at home and struck her left arm onto the door frame. Admitted for fracture of her L proximal humerus. Ortho was consulted and pt had ORIF of L humerus on 08/01/17.  Today, pt stable for discharge to follow up with PCP, orthopedics, and outpt PT. Denies any chest pain, SOB, abdominal pain, N/V, fever/chills.  Hospital Course:  Principal Problem:   Humerus fracture Active Problems:   HTN (hypertension)   CPAP (continuous positive airway pressure) dependence   Asthma  Left proximal humerus fracture with displacement S/p ORIF of L humerus on 08/01/17 Follow up with ortho Outpt PT  Normocytic anemia Likely due to post op Follow up with PCP   Leukocytosis Resolving, afebrile Likely reactive, post op PCP to follow  HTN Controlled Continue lisinopril, HCT  OSA CPAP     Procedures:  L ORIF on 08/01/17   Consultations:  Orthopedics  Discharge Exam: BP (!) 109/57 (BP Location: Right Wrist)   Pulse (!) 104   Temp 98.3 F (36.8 C) (Oral)   Resp 18    Ht 5\' 6"  (1.676 m)   Wt (!) 171.2 kg (377 lb 6.8 oz)   SpO2 98%   BMI 60.92 kg/m   General: NAD  Cardiovascular: S1, S2 present Respiratory: CTA   Discharge Instructions You were cared for by a hospitalist during your hospital stay. If you have any questions about your discharge medications or the care you received while you were in the hospital after you are discharged, you can call the unit and asked to speak with the hospitalist on call if the hospitalist that took care of you is not available. Once you are discharged, your primary care physician will handle any further medical issues. Please note that NO REFILLS for any discharge medications will be authorized once you are discharged, as it is imperative that you return to your primary care physician (or establish a relationship with a primary care physician if you do not have one) for your aftercare needs so that they can reassess your need for medications and monitor your lab values.   Allergies as of 08/03/2017      Reactions   Benzalkonium Chloride Anaphylaxis, Hives   Neosporin [neomycin-bacitracin Zn-polymyx] Anaphylaxis, Hives   Bee Venom Hives, Swelling   Throat swelling   Bupropion Hcl Other (See Comments)   homicidal   Cephalexin Hives, Swelling   Latex       Medication List    STOP taking these medications   acetaminophen 650 MG CR tablet Commonly known as:  TYLENOL   ibuprofen 800  MG tablet Commonly known as:  ADVIL,MOTRIN   magnesium oxide 400 (241.3 Mg) MG tablet Commonly known as:  MAG-OX   naproxen sodium 220 MG tablet Commonly known as:  ALEVE   oxyCODONE-acetaminophen 5-325 MG tablet Commonly known as:  PERCOCET/ROXICET   oxyCODONE-acetaminophen 7.5-325 MG tablet Commonly known as:  PERCOCET     TAKE these medications   albuterol 108 (90 Base) MCG/ACT inhaler Commonly known as:  VENTOLIN HFA Inhale 2 puffs into the lungs every 6 (six) hours as needed. What changed:  reasons to take this     ANTACID ANTI-GAS PO Take 1 tablet by mouth daily as needed (for gas).   aspirin EC 81 MG tablet Take 81 mg by mouth daily.   aspirin-acetaminophen-caffeine 250-250-65 MG tablet Commonly known as:  EXCEDRIN MIGRAINE Take 2 tablets by mouth 2 (two) times daily as needed for headache or migraine.   b complex vitamins tablet Take 1 tablet by mouth daily.   CALCIUM-D PO Take 1,200 mg by mouth 2 (two) times daily.   diphenhydrAMINE 25 MG tablet Commonly known as:  BENADRYL Take 25-50 mg by mouth every 6 (six) hours as needed for itching, allergies or sleep.   docusate sodium 100 MG capsule Commonly known as:  COLACE Take 1 capsule (100 mg total) by mouth 2 (two) times daily as needed for mild constipation.   ECHINACEA-GOLDEN SEAL PO Take 1 tablet by mouth 2 (two) times daily as needed (immune system boost).   hydrochlorothiazide 25 MG tablet Commonly known as:  HYDRODIURIL Take 1 tablet (25 mg total) by mouth daily. What changed:  how much to take   HYDROcodone-acetaminophen 7.5-325 MG tablet Commonly known as:  NORCO Take 1-2 tablets by mouth every 4 (four) hours as needed for moderate pain ((score 4 to 6)).   lisinopril 10 MG tablet Commonly known as:  PRINIVIL,ZESTRIL Take 1 tablet (10 mg total) by mouth daily. What changed:    how much to take  when to take this   Magnesium 500 MG Tabs Take 500 mg by mouth at bedtime.   Melatonin 10 MG Tabs Take 1 tablet by mouth at bedtime.   methocarbamol 750 MG tablet Commonly known as:  ROBAXIN Take 1 tablet (750 mg total) by mouth every 8 (eight) hours as needed for muscle spasms. What changed:    medication strength  how much to take  when to take this  Another medication with the same name was removed. Continue taking this medication, and follow the directions you see here.   multivitamin with minerals Tabs tablet Take 1 tablet by mouth daily.   mupirocin ointment 2 % Commonly known as:  BACTROBAN Apply 1  application topically 2 (two) times daily. To affected area What changed:    when to take this  reasons to take this  additional instructions   polyethylene glycol packet Commonly known as:  MIRALAX / GLYCOLAX Take 17 g by mouth daily as needed for moderate constipation or severe constipation.   senna-docusate 8.6-50 MG tablet Commonly known as:  Senokot-S Take 1 tablet by mouth at bedtime as needed for mild constipation.   sertraline 100 MG tablet Commonly known as:  ZOLOFT Take 1 tablet (100 mg total) by mouth daily.   silver sulfADIAZINE 1 % cream Commonly known as:  SILVADENE Apply 1 application topically 2 (two) times daily. What changed:    when to take this  reasons to take this   [facility] Wort 300 MG Caps Take 900 mg  by mouth 2 (two) times daily.   traMADol 50 MG tablet Commonly known as:  ULTRAM Take by mouth 2 (two) times daily as needed (back pain).   vitamin C 1000 MG tablet Take 1,000 mg by mouth 2 (two) times daily.   Vitamin D-3 5000 units Tabs Take 5,000 Units by mouth 2 (two) times daily.   ZINC PO Take 500 mg by mouth daily as needed (immune system boost).      Allergies  Allergen Reactions  . Benzalkonium Chloride Anaphylaxis and Hives  . Neosporin [Neomycin-Bacitracin Zn-Polymyx] Anaphylaxis and Hives  . Bee Venom Hives and Swelling    Throat swelling  . Bupropion Hcl Other (See Comments)    homicidal  . Cephalexin Hives and Swelling  . Latex    Follow-up Information    Haddix, Thomasene Lot, MD. Schedule an appointment as soon as possible for a visit in 2 week(s).   Specialty:  Orthopedic Surgery Contact information: [address] [place] [address] [place] [address] [phone]        Danielle Passy, MD. Schedule an appointment as soon as possible for a visit in 1 week(s).   Specialty:  Family Medicine Contact information: [place] [address] [phone]        Outpatient PT Follow up.   Why:  Pt should  call the number given for arrangements to schedule outpatient PT 3 times per week           The results of significant diagnostics from this hospitalization (including imaging, microbiology, ancillary and laboratory) are listed below for reference.    Significant Diagnostic Studies: Dg Chest Port 1 View  Result Date: 08/01/2017 CLINICAL DATA:  Preoperative evaluation for humeral fracture fixation EXAM: PORTABLE CHEST 1 VIEW COMPARISON:  Left humerus July 30, 2017 FINDINGS: Lungs are clear. Heart is upper normal in size with pulmonary vascularity within normal limits. No adenopathy. No pneumothorax. No rib fractures evident. The left humerus is not seen in appear separated from the glenoid. IMPRESSION: Separation of proximal left humerus from the glenoid, undoubtedly due to the traumatic changes in the proximal left humerus noted on recent left humerus radiographs. No edema or consolidation. Heart upper normal in size. No pneumothorax. Electronically Signed   By: Lowella Grip III M.D.   On: 08/01/2017 07:14   Dg Shoulder Left  Result Date: 08/01/2017 CLINICAL DATA:  ORIF. EXAM: DG C-ARM 61-120 MIN; LEFT SHOULDER - 2+ VIEW COMPARISON:  07/30/2017. FINDINGS: ORIF proximal left humeral fracture. Hardware intact. Near anatomic alignment. IMPRESSION: ORIF proximal left humeral fracture. Electronically Signed   By: Marcello Moores  Register   On: 08/01/2017 12:00   Dg Shoulder Left  Result Date: 07/31/2017 CLINICAL DATA:  Fall onto shoulder EXAM: LEFT SHOULDER - 2+ VIEW COMPARISON:  None. FINDINGS: AC joint appears intact. There is an acute comminuted and separated fracture involving the left humeral head, neck and proximal shaft. Superiorly positioned greater tuberosity fracture fragment. Humeral head positioned over the glenoid on Y-view but appears inferiorly positioned on the frontal view. IMPRESSION: Acute, comminuted and displaced fracture involving the left humeral head, neck and proximal shaft  with inferior positioning of the left humeral head with respect to the glenoid fossa. Electronically Signed   By: Donavan Foil M.D.   On: 07/31/2017 00:12   Dg Shoulder Left Port  Result Date: 08/01/2017 CLINICAL DATA:  Followup ORIF. EXAM: LEFT SHOULDER - 1 VIEW COMPARISON:  07/30/2017 FINDINGS: Two-view show ORIF of a proximal humeral  fracture with a lateral plate and multiple screws. Humeral head appears properly related to the glenoid. Improved position and alignment compared to the preoperative studies. IMPRESSION: ORIF with lateral plate and multiple screws. Improved position and alignment. Electronically Signed   By: Nelson Chimes M.D.   On: 08/01/2017 15:02   Dg Humerus Left  Result Date: 07/31/2017 CLINICAL DATA:  [place] onto left shoulder EXAM: LEFT HUMERUS - 2+ VIEW COMPARISON:  None. FINDINGS: Comminuted and displaced proximal humerus fracture. The mid to distal humerus is intact. IMPRESSION: Acute comminuted and displaced proximal humerus fracture. Electronically Signed   By: Donavan Foil M.D.   On: 07/31/2017 00:10   Dg C-arm 1-60 Min  Result Date: 08/01/2017 CLINICAL DATA:  ORIF. EXAM: DG C-ARM 61-120 MIN; LEFT SHOULDER - 2+ VIEW COMPARISON:  07/30/2017. FINDINGS: ORIF proximal left humeral fracture. Hardware intact. Near anatomic alignment. IMPRESSION: ORIF proximal left humeral fracture. Electronically Signed   By: Marcello Moores  Register   On: 08/01/2017 12:00   Dg C-arm 1-60 Min  Result Date: 08/01/2017 CLINICAL DATA:  ORIF. EXAM: DG C-ARM 61-120 MIN; LEFT SHOULDER - 2+ VIEW COMPARISON:  07/30/2017. FINDINGS: ORIF proximal left humeral fracture. Hardware intact. Near anatomic alignment. IMPRESSION: ORIF proximal left humeral fracture. Electronically Signed   By: Marcello Moores  Register   On: 08/01/2017 12:00   Dg C-arm 1-60 Min  Result Date: 08/01/2017 CLINICAL DATA:  ORIF. EXAM: DG C-ARM 61-120 MIN; LEFT SHOULDER - 2+ VIEW COMPARISON:  07/30/2017. FINDINGS: ORIF proximal left humeral  fracture. Hardware intact. Near anatomic alignment. IMPRESSION: ORIF proximal left humeral fracture. Electronically Signed   By: Marcello Moores  Register   On: 08/01/2017 12:00    Microbiology: Recent Results (from the past 240 hour(s))  MRSA PCR Screening     Status: None   Collection Time: 08/01/17  6:02 AM  Result Value Ref Range Status   MRSA by PCR NEGATIVE NEGATIVE Final    Comment:        The GeneXpert MRSA Assay (FDA approved for NASAL specimens only), is one component of a comprehensive MRSA colonization surveillance program. It is not intended to diagnose MRSA infection nor to guide or monitor treatment for MRSA infections. Performed at [facility], [place] [address] [place], [place] [address]      Labs: Basic Metabolic Panel: Recent Labs  Lab 08/01/17 0250 08/01/17 1131 08/02/17 0524  NA 137 140 137  K 4.3 4.3 4.1  CL 105  --  101  CO2 23  --  26  GLUCOSE 152* 151* 132*  BUN 15  --  8  CREATININE 0.73  --  0.69  CALCIUM 8.4*  --  8.2*   Liver Function Tests: Recent Labs  Lab 08/01/17 0250  AST 26  ALT 35  ALKPHOS 55  BILITOT 1.0  PROT 6.6  ALBUMIN 3.5   No results for input(s): LIPASE, AMYLASE in the last 168 hours. No results for input(s): AMMONIA in the last 168 hours. CBC: Recent Labs  Lab 08/01/17 0250 08/01/17 1131 08/02/17 0524 08/03/17 0342  WBC 11.3*  --  14.3* 11.9*  NEUTROABS 7.4  --   --  6.0  HGB 11.3* 9.9* 9.2* 8.4*  HCT 34.7* 29.0* 28.9* 25.6*  MCV 94.0  --  93.8 94.5  PLT 238  --  236 234   Cardiac Enzymes: No results for input(s): CKTOTAL, CKMB, CKMBINDEX, TROPONINI in the last 168 hours. BNP: BNP (last 3 results) No results for input(s): BNP in the last 8760 hours.  ProBNP (last 3 results) No results for input(s): PROBNP in the last 8760 hours.  CBG: Recent Labs  Lab 08/02/17 0833 08/02/17 1210 08/02/17 1652 08/03/17 0056 08/03/17 0825  GLUCAP 167* 119* 154* 117* 242*       Signed:  Alma Friendly, MD [facility] 08/03/2017, 8:36 PM

## 2017-08-03 NOTE — Progress Notes (Signed)
Orthopaedic Trauma Progress Note  S: Doing well, no issues  O:  Vitals:   08/02/17 1900 08/03/17 0404  BP: (!) 142/52 (!) 126/55  Pulse: 87 78  Resp: 18 17  Temp: 98.1 F (36.7 C) 97.6 F (36.4 C)  SpO2: 100% 100%   Gen: NAD, AAOx3, cooperative and pleasant LUE: Incisions clean, dry and intact, Swollen LUE, Median, radial and ulnar nerve intact. Warm and well perfused hand  Labs:  CBC    Component Value Date/Time   WBC 11.9 (H) 08/03/2017 0342   RBC 2.71 (L) 08/03/2017 0342   HGB 8.4 (L) 08/03/2017 0342   HCT 25.6 (L) 08/03/2017 0342   PLT 234 08/03/2017 0342   MCV 94.5 08/03/2017 0342   MCH 31.0 08/03/2017 0342   MCHC 32.8 08/03/2017 0342   RDW 13.4 08/03/2017 0342   LYMPHSABS 4.9 (H) 08/03/2017 0342   MONOABS 0.8 08/03/2017 0342   EOSABS 0.2 08/03/2017 0342   BASOSABS 0.0 08/03/2017 03474    A/P: 47 year old female with 4 part proximal humerus fracture  -NWB -Discharge today -Follow up with me in 2 weeks  Shona Needles, MD [facility] [phone] (phone)

## 2017-08-06 ENCOUNTER — Encounter (HOSPITAL_COMMUNITY): Payer: Self-pay | Admitting: Student

## 2017-08-06 ENCOUNTER — Ambulatory Visit (INDEPENDENT_AMBULATORY_CARE_PROVIDER_SITE_OTHER): Payer: BLUE CROSS/BLUE SHIELD | Admitting: Family Medicine

## 2017-08-06 VITALS — BP 130/78 | HR 96 | Temp 98.4°F | Ht 66.5 in | Wt 379.4 lb

## 2017-08-06 DIAGNOSIS — D72829 Elevated white blood cell count, unspecified: Secondary | ICD-10-CM | POA: Insufficient documentation

## 2017-08-06 DIAGNOSIS — S42202D Unspecified fracture of upper end of left humerus, subsequent encounter for fracture with routine healing: Secondary | ICD-10-CM | POA: Diagnosis not present

## 2017-08-06 DIAGNOSIS — D72823 Leukemoid reaction: Secondary | ICD-10-CM

## 2017-08-06 LAB — CBC WITH DIFFERENTIAL/PLATELET
BASOS PCT: 0.6 % (ref 0.0–3.0)
Basophils Absolute: 0.1 10*3/uL (ref 0.0–0.1)
EOS PCT: 1.8 % (ref 0.0–5.0)
Eosinophils Absolute: 0.2 10*3/uL (ref 0.0–0.7)
HEMATOCRIT: 27.2 % — AB (ref 36.0–46.0)
HEMOGLOBIN: 9.3 g/dL — AB (ref 12.0–15.0)
LYMPHS PCT: 23.8 % (ref 12.0–46.0)
Lymphs Abs: 2.2 10*3/uL (ref 0.7–4.0)
MCHC: 34.1 g/dL (ref 30.0–36.0)
MCV: 93.7 fl (ref 78.0–100.0)
MONOS PCT: 7.5 % (ref 3.0–12.0)
Monocytes Absolute: 0.7 10*3/uL (ref 0.1–1.0)
Neutro Abs: 6 10*3/uL (ref 1.4–7.7)
Neutrophils Relative %: 66.3 % (ref 43.0–77.0)
Platelets: 296 10*3/uL (ref 150.0–400.0)
RBC: 2.9 Mil/uL — AB (ref 3.87–5.11)
RDW: 13.8 % (ref 11.5–15.5)
WBC: 9.1 10*3/uL (ref 4.0–10.5)

## 2017-08-06 NOTE — Assessment & Plan Note (Signed)
Likely reactive. Repeat CBC today.

## 2017-08-06 NOTE — Assessment & Plan Note (Addendum)
S/p ORIF on 08/01/17. Continue PT. FOllow up with ortho. Advised weaning off vicodin and start ES tylenol for pain control.

## 2017-08-06 NOTE — Progress Notes (Signed)
Subjective:   Patient ID: Danielle Irwin, female    DOB: 09/11/1970, 47 y.o.   MRN: [id number]  Danielle Irwin is a pleasant 47 y.o. year old female who presents to clinic today with Hospitalization Follow-up (Patient is here today for hospital F/U.  She fell while going over to neighbors house and it was dark and missed a step and fell and hit her left arm on the door and then landed on her right side.  She had surgery as she shattered her left shoulder.  She is having problems with her right knee it is painful, swollen and bruised.  Having problems with neck pain and feels it may be "like whip lash." )  on 08/06/2017  HPI:  Hospital notes reviewed. Admitted 2/22 - 08/03/17 after she fell outside walking to her neighbor's house in the dark. During the fall, she struck her left arm on on the door frame. Admitted for fracture of left proximal humerus. Ortho was consulted- advised ORIF which was done on 08/01/17.  Has not yet started PT. Has follow up scheduled with ortho.  Did have some leukocytosis which was felt to be reactive.  Doing okay. Pain is improved.  Has not taken vicodin today. Lab Results  Component Value Date   WBC 11.9 (H) 08/03/2017   HGB 8.4 (L) 08/03/2017   HCT 25.6 (L) 08/03/2017   MCV 94.5 08/03/2017   PLT 234 08/03/2017   Current Outpatient Medications on File Prior to Visit  Medication Sig Dispense Refill  . albuterol (VENTOLIN HFA) 108 (90 Base) MCG/ACT inhaler Inhale 2 puffs into the lungs every 6 (six) hours as needed. (Patient taking differently: Inhale 2 puffs into the lungs every 6 (six) hours as needed for wheezing or shortness of breath. ) 1 Inhaler 1  . Alum & Mag Hydroxide-Simeth (ANTACID ANTI-GAS PO) Take 1 tablet by mouth daily as needed (for gas).    . Ascorbic Acid (VITAMIN C) 1000 MG tablet Take 1,000 mg by mouth 2 (two) times daily.    Marland Kitchen aspirin EC 81 MG tablet Take 81 mg by mouth daily.    Marland Kitchen aspirin-acetaminophen-caffeine (EXCEDRIN MIGRAINE)  250-250-65 MG tablet Take 2 tablets by mouth 2 (two) times daily as needed for headache or migraine.    Marland Kitchen b complex vitamins tablet Take 1 tablet by mouth daily.    . Calcium Carbonate-Vitamin D (CALCIUM-D PO) Take 1,200 mg by mouth 2 (two) times daily.    . Cholecalciferol (VITAMIN D-3) 5000 units TABS Take 5,000 Units by mouth 2 (two) times daily.    . diphenhydrAMINE (BENADRYL) 25 MG tablet Take 25-50 mg by mouth every 6 (six) hours as needed for itching, allergies or sleep.    Marland Kitchen docusate sodium (COLACE) 100 MG capsule Take 1 capsule (100 mg total) by mouth 2 (two) times daily as needed for mild constipation. 20 capsule 1  . ECHINACEA-GOLDEN SEAL PO Take 1 tablet by mouth 2 (two) times daily as needed (immune system boost).     . hydrochlorothiazide (HYDRODIURIL) 25 MG tablet Take 1 tablet (25 mg total) by mouth daily. (Patient taking differently: Take 25-50 mg by mouth daily. ) 30 tablet 11  . HYDROcodone-acetaminophen (NORCO) 7.5-325 MG tablet Take 1-2 tablets by mouth every 4 (four) hours as needed for moderate pain ((score 4 to 6)). 40 tablet 0  . lisinopril (PRINIVIL,ZESTRIL) 10 MG tablet Take 1 tablet (10 mg total) by mouth daily. (Patient taking differently: Take 9 mg by mouth at bedtime. )  90 tablet 3  . Magnesium 500 MG TABS Take 500 mg by mouth at bedtime.    . Melatonin 10 MG TABS Take 1 tablet by mouth at bedtime.     . methocarbamol (ROBAXIN) 750 MG tablet Take 1 tablet (750 mg total) by mouth every 8 (eight) hours as needed for muscle spasms. 30 tablet 0  . Multiple Vitamin (MULTIVITAMIN WITH MINERALS) TABS tablet Take 1 tablet by mouth daily.    . Multiple Vitamins-Minerals (ZINC PO) Take 500 mg by mouth daily as needed (immune system boost).    . mupirocin ointment (BACTROBAN) 2 % Apply 1 application topically 2 (two) times daily. To affected area (Patient taking differently: Apply 1 application topically 2 (two) times daily as needed (wound care). ) 15 g 1  . polyethylene glycol  (MIRALAX / GLYCOLAX) packet Take 17 g by mouth daily as needed for moderate constipation or severe constipation. 14 each 0  . senna-docusate (SENOKOT-S) 8.6-50 MG tablet Take 1 tablet by mouth at bedtime as needed for mild constipation. 15 tablet 0  . sertraline (ZOLOFT) 100 MG tablet Take 1 tablet (100 mg total) by mouth daily. 30 tablet 3  . silver sulfADIAZINE (SILVADENE) 1 % cream Apply 1 application topically 2 (two) times daily. (Patient taking differently: Apply 1 application topically 2 (two) times daily as needed (wound care). ) 50 g 0  . St Johns Wort 300 MG CAPS Take 900 mg by mouth 2 (two) times daily.     . traMADol (ULTRAM) 50 MG tablet Take by mouth 2 (two) times daily as needed (back pain).     No current facility-administered medications on file prior to visit.     Allergies  Allergen Reactions  . Benzalkonium Chloride Anaphylaxis and Hives  . Neosporin [Neomycin-Bacitracin Zn-Polymyx] Anaphylaxis and Hives  . Bee Venom Hives and Swelling    Throat swelling  . Bupropion Hcl Other (See Comments)    homicidal  . Cephalexin Hives and Swelling  . Latex     Past Medical History:  Diagnosis Date  . Asthma   . Depression   . Family history of adverse reaction to anesthesia    FATHER WAS SLOW TO WAKE UP  . Headache    OCCASIONAL MIGRAINES  . HNP (herniated nucleus pulposus), lumbar   . Hx of epistaxis   . Insomnia   . OA (osteoarthritis)   . Obesity   . PCOS (polycystic ovarian syndrome)   . Sensitive skin   . Vitamin D deficiency     Past Surgical History:  Procedure Laterality Date  . LUMBAR LAMINECTOMY/DECOMPRESSION MICRODISCECTOMY Right 06/02/2014   Procedure: MICRO LUMBAR DECOMPRESSION L4 - L5 ON THE RIGHT 1 LEVEL;  Surgeon: Johnn Hai, MD;  Location: [facility];  Service: Orthopedics;  Laterality: Right;  . WISDOM TOOTH EXTRACTION      Family History  Problem Relation Age of Onset  . Heart attack Father 53  . Breast cancer Maternal Aunt 55     Social History   Socioeconomic History  . Marital status: Single    Spouse name: Not on file  . Number of children: Not on file  . Years of education: Not on file  . Highest education level: Not on file  Social Needs  . Financial resource strain: Not on file  . Food insecurity - worry: Not on file  . Food insecurity - inability: Not on file  . Transportation needs - medical: Not on file  . Transportation needs - non-medical:  Not on file  Occupational History  . Not on file  Tobacco Use  . Smoking status: Former Smoker    Last attempt to quit: 05/26/1990    Years since quitting: 27.2  . Smokeless tobacco: Never Used  Substance and Sexual Activity  . Alcohol use: No    Alcohol/week: 0.0 oz  . Drug use: No  . Sexual activity: Not on file  Other Topics Concern  . Not on file  Social History Narrative  . Not on file   The PMH, PSH, Social History, Family History, Medications, and allergies have been reviewed in [facility], and have been updated if relevant.   Review of Systems  Gastrointestinal: Negative.   Genitourinary: Negative.   Musculoskeletal: Positive for arthralgias.  Neurological: Negative.   Hematological: Negative.   Psychiatric/Behavioral: Negative.   All other systems reviewed and are negative.      Objective:    BP 130/78 (BP Location: Right Arm, Patient Position: Sitting, Cuff Size: Normal)   Pulse 96   Temp 98.4 F (36.9 C) (Oral)   Ht 5' 6.5" (1.689 m)   Wt (!) 379 lb 6.4 oz (172.1 kg)   SpO2 99%   BMI 60.32 kg/m    Physical Exam  Constitutional: She is oriented to person, place, and time. She appears well-developed and well-nourished.  HENT:  Head: Atraumatic.  Eyes: Conjunctivae are normal.  Cardiovascular: Normal rate.  Pulmonary/Chest: Effort normal.  Musculoskeletal: She exhibits no edema.  Left arm in sling, sutures c/d/i  Neurological: She is alert and oriented to person, place, and time. No cranial nerve deficit.  Skin: She is  not diaphoretic.  Psychiatric: She has a normal mood and affect. Her behavior is normal. Judgment and thought content normal.  Nursing note and vitals reviewed.         Assessment & Plan:   Closed fracture of proximal end of left humerus with routine healing, unspecified fracture morphology, subsequent encounter  Leukemoid reaction - Plan: CBC with Differential/Platelet No Follow-up on file.

## 2017-08-06 NOTE — Patient Instructions (Signed)
Great to see you. Continue weaning off vicodin. Okay to start taking Tylenol ES twice daily.   I will call you with your lab results from today and you can view them online.

## 2017-08-07 ENCOUNTER — Telehealth: Payer: Self-pay | Admitting: Family Medicine

## 2017-08-07 NOTE — Telephone Encounter (Signed)
Copied from [place]. Topic: Quick Communication - Lab Results >> Aug 07, 2017  2:23 PM Burnis Medin, NT wrote: Patient called wanting to get her lab results. Triage can disclose results.

## 2017-08-08 NOTE — Telephone Encounter (Signed)
Attempted to call patient, left VM to call back to receive lab results. Results are in result note.

## 2017-08-14 ENCOUNTER — Other Ambulatory Visit: Payer: Self-pay

## 2017-08-14 ENCOUNTER — Encounter: Payer: Self-pay | Admitting: Physical Therapy

## 2017-08-14 ENCOUNTER — Ambulatory Visit: Payer: BLUE CROSS/BLUE SHIELD | Attending: Student | Admitting: Physical Therapy

## 2017-08-14 DIAGNOSIS — M6281 Muscle weakness (generalized): Secondary | ICD-10-CM | POA: Diagnosis present

## 2017-08-14 DIAGNOSIS — M25512 Pain in left shoulder: Secondary | ICD-10-CM | POA: Diagnosis present

## 2017-08-14 DIAGNOSIS — M25612 Stiffness of left shoulder, not elsewhere classified: Secondary | ICD-10-CM | POA: Insufficient documentation

## 2017-08-15 NOTE — Therapy (Signed)
[facility] [address] [place], [address] Phone: [phone]   Fax:  [phone]  Physical Therapy Evaluation  Patient Details  Name: Danielle Irwin MRN: [id number] Date of Birth: [birth year] Referring Provider: Katha Hamming MD   Encounter Date: 08/14/2017  PT End of Session - 08/14/17 1759    Visit Number  1    Number of Visits  12    Date for PT Re-Evaluation  09/25/17    PT Start Time  9798    PT Stop Time  1823    PT Time Calculation (min)  53 min    Activity Tolerance  Patient tolerated treatment well    Behavior During Therapy  [facility] for tasks assessed/performed       Past Medical History:  Diagnosis Date  . Asthma   . Depression   . Family history of adverse reaction to anesthesia    FATHER WAS SLOW TO WAKE UP  . Headache    OCCASIONAL MIGRAINES  . HNP (herniated nucleus pulposus), lumbar   . Hx of epistaxis   . Insomnia   . OA (osteoarthritis)   . Obesity   . PCOS (polycystic ovarian syndrome)   . Sensitive skin   . Vitamin D deficiency     Past Surgical History:  Procedure Laterality Date  . LUMBAR LAMINECTOMY/DECOMPRESSION MICRODISCECTOMY Right 06/02/2014   Procedure: MICRO LUMBAR DECOMPRESSION L4 - L5 ON THE RIGHT 1 LEVEL;  Surgeon: Johnn Hai, MD;  Location: [facility];  Service: Orthopedics;  Laterality: Right;  . ORIF HUMERUS FRACTURE Left 08/01/2017   Procedure: OPEN REDUCTION INTERNAL FIXATION (ORIF) PROXIMAL HUMERUS FRACTURE;  Surgeon: Shona Needles, MD;  Location: [facility];  Service: Orthopedics;  Laterality: Left;  . WISDOM TOOTH EXTRACTION      There were no vitals filed for this visit.   Subjective Assessment - 08/14/17 1745    Subjective  Patient reports pain in left UE shoulder to hand and is throbbing, aching pain. She is exercising as instructed at home    Pertinent History  Patient reports she fell into a storm door at neighbors after stubbing toe on step 07/30/17.  She felt immediate pain and numbness left UE and she could not move her left UE. She was transported to ED and then transported to [facility] in [place] and underwent surgery with ORIF 08/01/17 and released to home 08/03/17.     Limitations  Lifting;Walking;House hold activities out of work; personal care; staying at parent;s due to limitations    Patient Stated Goals  to be able to use her arm again for personal care, return to all ADL's with minimal difficutly    Currently in Pain?  Yes    Pain Score  3  worst 7/10, best 3/10    Pain Location  Arm    Pain Orientation  Left    Pain Descriptors / Indicators  Aching;Throbbing    Pain Type  Acute pain;Surgical pain 08/01/17    Pain Radiating Towards  up and down left UE hand to shoulder    Pain Onset  1 to 4 weeks ago    Pain Frequency  Constant         [facility] PT Assessment - 08/14/17 1737      Assessment   Medical Diagnosis  left proximal humerus fracture ORIF    Referring Provider  Katha Hamming MD    Onset Date/Surgical Date  08/01/17 injury 07/30/2017  Hand Dominance  Right    Prior Therapy  none      Precautions   Precautions  Shoulder    Type of Shoulder Precautions  NWB left UE    Required Braces or Orthoses  Sling      Restrictions   Weight Bearing Restrictions  Yes    LUE Weight Bearing  Non weight bearing    Other Position/Activity Restrictions  passive and AAROM left shoulder      Balance Screen   Has the patient fallen in the past 6 months  Yes    How many times?  1 07/30/17 injuring left UE    Has the patient had a decrease in activity level because of a fear of falling?   No    Is the patient reluctant to leave their home because of a fear of falling?   No      Home Environment   Living Environment  Private residence    Living Arrangements  Alone    Type of [place] to enter    Entrance Stairs-Number of Steps  [place]  One level    [place] - 2 wheels;Toilet riser;Hand held shower head;Other (comment) [place]ry[place]ane      Prior Function   Level of Independence  Independent    Vocation  Full time employment    Vocation Requirements  sittting, standing, bending, requires 2 hands    Leisure  shoppiing, TV      Cognition   Overall Cognitive Status  Within Functional Limits for tasks assessed      Observation/Other Assessments   Focus on Therapeutic Outcomes (FOTO)   4/100      Posture/Postural Control   Posture Comments  in sling left UE, guarded      ROM / Strength   AROM / PROM / Strength  AROM;PROM;Strength      AROM: left UE elbow 25 extension to 115 flexion: shoulder PROM with pendulum 80 degrees forward elevation; Right UE AROM  shoulder elbow and wrist/hand WNL all planes  Strength:  Left UE deferred due to recent surgery and limitations Right UE grossly WNL all major muscle groups   Objective measurements completed on examination: See above findings.     PT Education - 08/14/17 1828    Education provided  Yes    Education Details  discussed POC, findings, instructed in correct technique for pendulums to relax left UE; HEP for AROM elbow flexion, extension, wrist flexion and extension and gripping     Person(s) Educated  Patient    Methods  Explanation;Demonstration;Verbal cues    Comprehension  Verbalized understanding;Returned demonstration;Verbal cues required          PT Long Term Goals - 08/14/17 1832      PT LONG TERM GOAL #1   Title  Patient will improve FOTO score to 20/100 demonstrating improvement with functional use left UE for daily tasks    Baseline  FOTO 4/100    Status  New    Target Date  09/05/17      PT LONG TERM GOAL #2   Title  Patient will improve FOTO score to 30/100 demonstrating improvement with functional use left UE for daily tasks    Baseline  FOTO 4/100    Status  New    Target Date  09/25/17      PT LONG TERM  GOAL #3   Title  patient will be independent  with home exercises for flexibility and strength to allow transition to self management once discharged from physical therapy    Baseline  requires assistance for ROM left shoulder, guidance and cuing to perform all exercises    Status  New    Target Date  09/25/17             Plan - 08/14/17 1935    Clinical Impression Statement  Patient is a 47 year old right hand dominant female who presents s/p left proximal humerus fracture, ORIF 08/01/17. She is limited with all functional tasks due to injury and immoblizaiton of left UE. She has limited left elbow and shoulder ROM, pain and decreased strength that limit functional use of left UE. Her FOTO score of 4/100 indicated severe impairment. She has limited knowledge of appropriate pain control and progression of exercise and will requrie physical therapy intervention to [employer].     Clinical Presentation  Evolving    Clinical Presentation due to:  recent injury with ORIF left humerus fracture    Clinical Decision Making  Moderate    Rehab Potential  Good    Clinical Impairments Affecting Rehab Potential  (+) motivated, acute condition (-)obesity, DDD, lumbar     PT Frequency  2x / week    PT Duration  6 weeks    PT Treatment/Interventions  Manual techniques;Neuromuscular re-education;Patient/family education;Cryotherapy;Electrical Stimulation;Moist Heat;Therapeutic exercise;Therapeutic activities;Balance training;Scar mobilization;Passive range of motion    PT Next Visit Plan  pain control, progressive exercises PROM/AAROM shoulder and AROM elbow to hand to improve control/strength left UE    PT Home Exercise Plan  AROM left elbow, wrist, hand, pendulum exercise for shoulder    Consulted and Agree with Plan of Care  Patient       Patient will benefit from skilled therapeutic intervention in order to improve the following deficits and impairments:  Pain, Impaired UE functional use, Impaired perceived functional ability, Decreased  strength, Decreased range of motion, Decreased endurance, Decreased activity tolerance, Decreased balance, Obesity  Visit Diagnosis: Acute pain of left shoulder - Plan: PT plan of care cert/re-cert  Muscle weakness (generalized) - Plan: PT plan of care cert/re-cert  Stiffness of left shoulder, not elsewhere classified - Plan: PT plan of care cert/re-cert     Problem List Patient Active Problem List   Diagnosis Date Noted  . Leukocytosis 08/06/2017  . Humerus fracture 08/01/2017  . CPAP (continuous positive airway pressure) dependence 08/01/2017  . Asthma 08/01/2017  . HTN (hypertension) 06/24/2017  . Depression 11/07/2015  . Systolic murmur 60/73/7106  . Morbid obesity ([place]) 10/26/2014  . Insomnia 06/21/2014  . Anxiety 06/21/2014  . Spinal stenosis at L4-L5 level 06/02/2014  . POLYCYSTIC OVARIAN DISEASE 06/19/2010    Jomarie Longs PT 08/15/2017, 1:27 PM  [facility] [address] [place], [address] Phone: [phone]   Fax:  [phone]  Name: Danielle Irwin MRN: [id number] Date of Birth: August 18, 1970

## 2017-08-18 ENCOUNTER — Encounter: Payer: Self-pay | Admitting: Physical Therapy

## 2017-08-18 ENCOUNTER — Ambulatory Visit: Payer: BLUE CROSS/BLUE SHIELD | Admitting: Physical Therapy

## 2017-08-18 DIAGNOSIS — M25512 Pain in left shoulder: Secondary | ICD-10-CM

## 2017-08-18 DIAGNOSIS — M6281 Muscle weakness (generalized): Secondary | ICD-10-CM

## 2017-08-18 DIAGNOSIS — M25612 Stiffness of left shoulder, not elsewhere classified: Secondary | ICD-10-CM

## 2017-08-18 NOTE — Therapy (Signed)
[facility] [address] [place], [address] Phone: [phone]   Fax:  [phone]  Physical Therapy Treatment  Patient Details  Name: Danielle Irwin MRN: [id number] Date of Birth: 1971/05/30 Referring Provider: Katha Hamming MD   Encounter Date: 08/18/2017  PT End of Session - 08/18/17 1154    Visit Number  2    Number of Visits  12    Date for PT Re-Evaluation  09/25/17    PT Start Time  1150    PT Stop Time  1215    PT Time Calculation (min)  25 min    Activity Tolerance  Patient tolerated treatment well;Patient limited by pain    Behavior During Therapy  [facility] for tasks assessed/performed       Past Medical History:  Diagnosis Date  . Asthma   . Depression   . Family history of adverse reaction to anesthesia    FATHER WAS SLOW TO WAKE UP  . Headache    OCCASIONAL MIGRAINES  . HNP (herniated nucleus pulposus), lumbar   . Hx of epistaxis   . Insomnia   . OA (osteoarthritis)   . Obesity   . PCOS (polycystic ovarian syndrome)   . Sensitive skin   . Vitamin D deficiency     Past Surgical History:  Procedure Laterality Date  . LUMBAR LAMINECTOMY/DECOMPRESSION MICRODISCECTOMY Right 06/02/2014   Procedure: MICRO LUMBAR DECOMPRESSION L4 - L5 ON THE RIGHT 1 LEVEL;  Surgeon: Johnn Hai, MD;  Location: [facility];  Service: Orthopedics;  Laterality: Right;  . ORIF HUMERUS FRACTURE Left 08/01/2017   Procedure: OPEN REDUCTION INTERNAL FIXATION (ORIF) PROXIMAL HUMERUS FRACTURE;  Surgeon: Shona Needles, MD;  Location: [facility];  Service: Orthopedics;  Laterality: Left;  . WISDOM TOOTH EXTRACTION      There were no vitals filed for this visit.  Subjective Assessment - 08/18/17 1152    Subjective  Patient reports she is still having pain in left arm and bruising posterior aspect of forearm and upper arm    Pertinent History  Patient reports she fell into a storm door at neighbors after stubbing toe on step  07/30/17. She felt immediate pain and numbness left UE and she could not move her left UE. She was transported to ED and then transported to [facility] in [place] and underwent surgery with ORIF 08/01/17 and released to home 08/03/17.     Limitations  Lifting;Walking;House hold activities out of work; personal care; staying at parent;s due to limitations    Patient Stated Goals  to be able to use her arm again for personal care, return to all ADL's with minimal difficutly    Currently in Pain?  Yes    Pain Score  5     Pain Location  Arm and shoulder (6/10)    Pain Orientation  Left    Pain Descriptors / Indicators  Aching;Throbbing    Pain Type  Acute pain;Surgical pain 08/01/2017    Pain Onset  1 to 4 weeks ago    Pain Frequency  Constant       Objective: Patient arrived with sling in place: adjusted for comfort at end of session to improve left UE positioning AAROM left elbow 10 -115 flexion pre treatment: 5 -120 post STM/exercise Observation: ecchymosis posterior aspect left forearm and elbow  Treatment:  Manual therapy: 15 min.  STM superficial techniques to left elbow forearm at biceps attachment to improve soft tissue elasticity  and improve ROM with less pain in elbow  Therapeutic exercise: patient performed with assistance, verbal and tactile cues of therapist:  PROM/AAROM left shoulder multiple reps 3 sets: up to 70 degrees  flexion, 5 degrees elbow extension and 120 flexion  Patient response to treatment: patient demonstrated improved technique with exercises with minimal VC for correct alignment and pain limiting further motion.  Patient with improved soft tissue mobility by 50% following STM. Improved motor control with repetition and cuing   PT Education - 08/18/17 1200    Education provided  Yes    Education Details  re assessed HEP, exercise instruction for ROM    Person(s) Educated  Patient    Methods  Explanation;Verbal cues    Comprehension  Verbalized  understanding;Verbal cues required          PT Long Term Goals - 08/14/17 1832      PT LONG TERM GOAL #1   Title  Patient will improve FOTO score to 20/100 demonstrating improvement with functional use left UE for daily tasks    Baseline  FOTO 4/100    Status  New    Target Date  09/05/17      PT LONG TERM GOAL #2   Title  Patient will improve FOTO score to 30/100 demonstrating improvement with functional use left UE for daily tasks    Baseline  FOTO 4/100    Status  New    Target Date  09/25/17      PT LONG TERM GOAL #3   Title  patient will be independent with home exercises for flexibility and strength to allow transition to self management once discharged from physical therapy    Baseline  requires assistance for ROM left shoulder, guidance and cuing to perform all exercises    Status  New    Target Date  09/25/17            Plan - 08/18/17 1343    Clinical Impression Statement  Patient improved PROM left shoulder with repetition and cuing to relax. improved soft tissue elasticity in left UE biceps attachment at elbow allowing improved elbow extension with less discomfort.    Rehab Potential  Good    Clinical Impairments Affecting Rehab Potential  (+) motivated, acute condition (-)obesity, DDD, lumbar     PT Frequency  2x / week    PT Duration  6 weeks    PT Treatment/Interventions  Manual techniques;Neuromuscular re-education;Patient/family education;Cryotherapy;Electrical Stimulation;Moist Heat;Therapeutic exercise;Therapeutic activities;Balance training;Scar mobilization;Passive range of motion    PT Next Visit Plan  pain control, progressive exercises PROM/AAROM shoulder and AROM elbow to hand to improve control/strength left UE    PT Home Exercise Plan  AROM left elbow, wrist, hand, pendulum exercise for shoulder       Patient will benefit from skilled therapeutic intervention in order to improve the following deficits and impairments:  Pain, Impaired UE  functional use, Impaired perceived functional ability, Decreased strength, Decreased range of motion, Decreased endurance, Decreased activity tolerance, Decreased balance, Obesity  Visit Diagnosis: Acute pain of left shoulder  Muscle weakness (generalized)  Stiffness of left shoulder, not elsewhere classified     Problem List Patient Active Problem List   Diagnosis Date Noted  . Leukocytosis 08/06/2017  . Humerus fracture 08/01/2017  . CPAP (continuous positive airway pressure) dependence 08/01/2017  . Asthma 08/01/2017  . HTN (hypertension) 06/24/2017  . Depression 11/07/2015  . Systolic murmur 93/81/8299  . Morbid obesity ([place]) 10/26/2014  . Insomnia 06/21/2014  .  Anxiety 06/21/2014  . Spinal stenosis at L4-L5 level 06/02/2014  . POLYCYSTIC OVARIAN DISEASE 06/19/2010    Jomarie Longs PT 08/19/2017, 9:51 AM  [facility] [address], [place], [address] Phone: [phone]   Fax:  [phone]  Name: Danielle Irwin MRN: [id number] Date of Birth: 1970/11/23

## 2017-08-20 ENCOUNTER — Ambulatory Visit: Payer: BLUE CROSS/BLUE SHIELD | Admitting: Physical Therapy

## 2017-08-20 ENCOUNTER — Encounter: Payer: Self-pay | Admitting: Physical Therapy

## 2017-08-20 DIAGNOSIS — M6281 Muscle weakness (generalized): Secondary | ICD-10-CM

## 2017-08-20 DIAGNOSIS — M25512 Pain in left shoulder: Secondary | ICD-10-CM | POA: Diagnosis not present

## 2017-08-20 DIAGNOSIS — M25612 Stiffness of left shoulder, not elsewhere classified: Secondary | ICD-10-CM

## 2017-08-20 NOTE — Therapy (Signed)
[facility] [address], [place], [address] Phone: [phone]   Fax:  [phone]  Physical Therapy Treatment  Patient Details  Name: Danielle Irwin MRN: [id number] Date of Birth: 04-Jul-1970 Referring Provider: Katha Hamming MD   Encounter Date: 08/20/2017  PT End of Session - 08/20/17 1339    Visit Number  3    Number of Visits  12    Date for PT Re-Evaluation  09/25/17    Authorization Type  3 of 6 FOTO    PT Start Time  1302    PT Stop Time  1342    PT Time Calculation (min)  40 min    Activity Tolerance  Patient tolerated treatment well;Patient limited by pain    Behavior During Therapy  [facility] for tasks assessed/performed       Past Medical History:  Diagnosis Date  . Asthma   . Depression   . Family history of adverse reaction to anesthesia    FATHER WAS SLOW TO WAKE UP  . Headache    OCCASIONAL MIGRAINES  . HNP (herniated nucleus pulposus), lumbar   . Hx of epistaxis   . Insomnia   . OA (osteoarthritis)   . Obesity   . PCOS (polycystic ovarian syndrome)   . Sensitive skin   . Vitamin D deficiency     Past Surgical History:  Procedure Laterality Date  . LUMBAR LAMINECTOMY/DECOMPRESSION MICRODISCECTOMY Right 06/02/2014   Procedure: MICRO LUMBAR DECOMPRESSION L4 - L5 ON THE RIGHT 1 LEVEL;  Surgeon: Johnn Hai, MD;  Location: [facility];  Service: Orthopedics;  Laterality: Right;  . ORIF HUMERUS FRACTURE Left 08/01/2017   Procedure: OPEN REDUCTION INTERNAL FIXATION (ORIF) PROXIMAL HUMERUS FRACTURE;  Surgeon: Shona Needles, MD;  Location: [facility];  Service: Orthopedics;  Laterality: Left;  . WISDOM TOOTH EXTRACTION      There were no vitals filed for this visit.  Subjective Assessment - 08/20/17 1306    Subjective  Patient reports good report from MD and is able to exercise and continue NWB as instructed.     Pertinent History  Patient reports she fell into a storm door at neighbors after  stubbing toe on step 07/30/17. She felt immediate pain and numbness left UE and she could not move her left UE. She was transported to ED and then transported to [facility] in [place] and underwent surgery with ORIF 08/01/17 and released to home 08/03/17.     Limitations  Lifting;Walking;House hold activities out of work; personal care; staying at parent;s due to limitations    Patient Stated Goals  to be able to use her arm again for personal care, return to all ADL's with minimal difficutly    Currently in Pain?  Yes    Pain Score  3     Pain Location  Arm    Pain Orientation  Left    Pain Descriptors / Indicators  Aching;Throbbing    Pain Onset  1 to 4 weeks ago    Pain Frequency  Constant       Objective: Patient arrived with sling in place AAROM left elbow 5 -120 flexion pre treatment Observation: ecchymosis posterior aspect left forearm and elbow  Treatment:  Manual therapy: 104min.  STM superficial techniques to left elbow forearm at biceps attachment to improve soft tissue elasticity and improve ROM with less pain in elbow  Therapeutic exercise: patient performed with assistance, verbal and tactile cues of therapist:  PROM/AAROM left shoulder multiple reps 3 sets: up to 70 degrees  flexion, 5 degrees elbow extension and 120 flexion  Modalities: Electrical stimulation: 15 [id number] stim. 10/10 cycle applied (2) electrodes to left shoulder periscapular muscles rhomboids/lower trapezius with patient reclined with left UE supported on pillow; ice pack placed over left shoulder during treatment:  goal muscle re education; pain: no adverse reactions noted  Patient response to treatment: Patient with improved PROM left shoulder up to 90 degrees with pain limiting further motion. She demonstrated improved soft tissue mobility with biceps and less difficulty with ROM of elbow following STM. decreased soreness/pain to mild at end of session.     PT Education - 08/20/17 1330     Education provided  Yes    Education Details  exercise instruction for technique    Person(s) Educated  Patient    Methods  Explanation;Verbal cues    Comprehension  Verbalized understanding;Verbal cues required          PT Long Term Goals - 08/14/17 1832      PT LONG TERM GOAL #1   Title  Patient will improve FOTO score to 20/100 demonstrating improvement with functional use left UE for daily tasks    Baseline  FOTO 4/100    Status  New    Target Date  09/05/17      PT LONG TERM GOAL #2   Title  Patient will improve FOTO score to 30/100 demonstrating improvement with functional use left UE for daily tasks    Baseline  FOTO 4/100    Status  New    Target Date  09/25/17      PT LONG TERM GOAL #3   Title  patient will be independent with home exercises for flexibility and strength to allow transition to self management once discharged from physical therapy    Baseline  requires assistance for ROM left shoulder, guidance and cuing to perform all exercises    Status  New    Target Date  09/25/17            Plan - 08/20/17 1351    Clinical Impression Statement  Patient with improved ROM with assistance, verbal cuing and repetition. she continues with decreased ROM and pain as she heals from injury and will require additional physical therapy intervention to achieve goals.     Rehab Potential  Good    Clinical Impairments Affecting Rehab Potential  (+) motivated, acute condition (-)obesity, DDD, lumbar     PT Frequency  2x / week    PT Duration  6 weeks    PT Treatment/Interventions  Manual techniques;Neuromuscular re-education;Patient/family education;Cryotherapy;Electrical Stimulation;Moist Heat;Therapeutic exercise;Therapeutic activities;Balance training;Scar mobilization;Passive range of motion    PT Next Visit Plan  pain control, progressive exercises PROM/AAROM shoulder and AROM elbow to hand to improve control/strength left UE    PT Home Exercise Plan  AROM left elbow,  wrist, hand, pendulum exercise for shoulder       Patient will benefit from skilled therapeutic intervention in order to improve the following deficits and impairments:  Pain, Impaired UE functional use, Impaired perceived functional ability, Decreased strength, Decreased range of motion, Decreased endurance, Decreased activity tolerance, Decreased balance, Obesity  Visit Diagnosis: Acute pain of left shoulder  Muscle weakness (generalized)  Stiffness of left shoulder, not elsewhere classified     Problem List Patient Active Problem List   Diagnosis Date Noted  . Leukocytosis 08/06/2017  . Humerus fracture 08/01/2017  . CPAP (continuous positive airway pressure)  dependence 08/01/2017  . Asthma 08/01/2017  . HTN (hypertension) 06/24/2017  . Depression 11/07/2015  . Systolic murmur 32/76/1470  . Morbid obesity ([place]) 10/26/2014  . Insomnia 06/21/2014  . Anxiety 06/21/2014  . Spinal stenosis at L4-L5 level 06/02/2014  . POLYCYSTIC OVARIAN DISEASE 06/19/2010    Jomarie Longs PT 08/21/2017, 10:24 AM  [facility] [address], [place], [address] Phone: [phone]   Fax:  [phone]  Name: Danielle Irwin MRN: [id number] Date of Birth: [birth year]

## 2017-08-21 ENCOUNTER — Emergency Department
Admission: EM | Admit: 2017-08-21 | Discharge: 2017-08-21 | Disposition: A | Discharge status: Home / Self Care | Payer: BLUE CROSS/BLUE SHIELD | Attending: Emergency Medicine | Admitting: Emergency Medicine

## 2017-08-21 ENCOUNTER — Ambulatory Visit: Payer: Self-pay

## 2017-08-21 ENCOUNTER — Emergency Department: Payer: BLUE CROSS/BLUE SHIELD

## 2017-08-21 ENCOUNTER — Ambulatory Visit: Payer: Self-pay | Admitting: *Deleted

## 2017-08-21 ENCOUNTER — Other Ambulatory Visit: Payer: Self-pay

## 2017-08-21 DIAGNOSIS — Z7982 Long term (current) use of aspirin: Secondary | ICD-10-CM | POA: Diagnosis not present

## 2017-08-21 DIAGNOSIS — I82431 Acute embolism and thrombosis of right popliteal vein: Secondary | ICD-10-CM

## 2017-08-21 DIAGNOSIS — I10 Essential (primary) hypertension: Secondary | ICD-10-CM | POA: Diagnosis not present

## 2017-08-21 DIAGNOSIS — J45909 Unspecified asthma, uncomplicated: Secondary | ICD-10-CM | POA: Insufficient documentation

## 2017-08-21 DIAGNOSIS — Z79899 Other long term (current) drug therapy: Secondary | ICD-10-CM | POA: Diagnosis not present

## 2017-08-21 DIAGNOSIS — I82401 Acute embolism and thrombosis of unspecified deep veins of right lower extremity: Secondary | ICD-10-CM | POA: Diagnosis not present

## 2017-08-21 DIAGNOSIS — R2241 Localized swelling, mass and lump, right lower limb: Secondary | ICD-10-CM | POA: Diagnosis present

## 2017-08-21 DIAGNOSIS — Z87891 Personal history of nicotine dependence: Secondary | ICD-10-CM | POA: Diagnosis not present

## 2017-08-21 MED ORDER — ELIQUIS 5 MG VTE STARTER PACK: ORAL_TABLET | ORAL | 0 refills | Status: DC

## 2017-08-21 MED ORDER — APIXABAN 5 MG PO TABS
10.0000 mg | ORAL_TABLET | Freq: Once | ORAL | Status: AC
  Administered 2017-08-21: 10 mg via ORAL
  Filled 2017-08-21: qty 2

## 2017-08-21 MED ORDER — APIXABAN 5 MG PO TABS: 10.0000 mg | ORAL_TABLET | Freq: Two times a day (BID) | ORAL | Status: DC

## 2017-08-21 MED ORDER — APIXABAN 5 MG PO TABS
ORAL_TABLET | ORAL | Status: AC
  Administered 2017-08-21: 10 mg via ORAL
  Filled 2017-08-21: qty 2

## 2017-08-21 NOTE — ED Triage Notes (Signed)
Pt had recent left shoulder surgery 2/22. For last 10 days, right calf pain, knot to right calf per patient. Sent here for possible blood clot. Calf pain worse with walking.

## 2017-08-21 NOTE — ED Provider Notes (Signed)
[facility] Emergency Department Provider Note  ___________________________________________   First MD Initiated Contact with Patient 08/21/17 1522     (approximate)  I have reviewed the triage vital signs and the nursing notes.   HISTORY  Chief Complaint Leg Pain   HPI Danielle Irwin is a 47 y.o. female with a history of left shoulder surgery on February 22 was presenting with right lower extremity swelling as well as a "knot" to the right lateral ankle.  She called her primary care doctor for this problem was told to report to the emergency department for further evaluation and ultrasound of the lower extremity.  Patient denies any chest pain or shortness of breath.  Says that when she fell causing injury that required the shoulder surgery that she also hit her right knee and lower extremity.   Past Medical History:  Diagnosis Date  . Asthma   . Depression   . Family history of adverse reaction to anesthesia    FATHER WAS SLOW TO WAKE UP  . Headache    OCCASIONAL MIGRAINES  . HNP (herniated nucleus pulposus), lumbar   . Hx of epistaxis   . Insomnia   . OA (osteoarthritis)   . Obesity   . PCOS (polycystic ovarian syndrome)   . Sensitive skin   . Vitamin D deficiency     Patient Active Problem List   Diagnosis Date Noted  . Leukocytosis 08/06/2017  . Humerus fracture 08/01/2017  . CPAP (continuous positive airway pressure) dependence 08/01/2017  . Asthma 08/01/2017  . HTN (hypertension) 06/24/2017  . Depression 11/07/2015  . Systolic murmur 16/03/9603  . Morbid obesity ([place]) 10/26/2014  . Insomnia 06/21/2014  . Anxiety 06/21/2014  . Spinal stenosis at L4-L5 level 06/02/2014  . POLYCYSTIC OVARIAN DISEASE 06/19/2010    Past Surgical History:  Procedure Laterality Date  . LUMBAR LAMINECTOMY/DECOMPRESSION MICRODISCECTOMY Right 06/02/2014   Procedure: MICRO LUMBAR DECOMPRESSION L4 - L5 ON THE RIGHT 1 LEVEL;  Surgeon: Johnn Hai, MD;   Location: [facility];  Service: Orthopedics;  Laterality: Right;  . ORIF HUMERUS FRACTURE Left 08/01/2017   Procedure: OPEN REDUCTION INTERNAL FIXATION (ORIF) PROXIMAL HUMERUS FRACTURE;  Surgeon: Shona Needles, MD;  Location: [place];  Service: Orthopedics;  Laterality: Left;  . WISDOM TOOTH EXTRACTION      Prior to Admission medications   Medication Sig Start Date End Date Taking? Authorizing Provider  albuterol (VENTOLIN HFA) 108 (90 Base) MCG/ACT inhaler Inhale 2 puffs into the lungs every 6 (six) hours as needed. Patient taking differently: Inhale 2 puffs into the lungs every 6 (six) hours as needed for wheezing or shortness of breath.  06/24/17   Lucille Passy, MD  Alum & Mag Hydroxide-Simeth (ANTACID ANTI-GAS PO) Take 1 tablet by mouth daily as needed (for gas).    [provider]  Ascorbic Acid (VITAMIN C) 1000 MG tablet Take 1,000 mg by mouth 2 (two) times daily.    [provider]  aspirin EC 81 MG tablet Take 81 mg by mouth daily.    [provider]  aspirin-acetaminophen-caffeine (EXCEDRIN MIGRAINE) [phone] MG tablet Take 2 tablets by mouth 2 (two) times daily as needed for headache or migraine.    [provider]  b complex vitamins tablet Take 1 tablet by mouth daily.    [provider]  Calcium Carbonate-Vitamin D (CALCIUM-D PO) Take 1,200 mg by mouth 2 (two) times daily.    [provider]  Cholecalciferol (VITAMIN D-3) 5000  units TABS Take 5,000 Units by mouth 2 (two) times daily.    [provider]  diphenhydrAMINE (BENADRYL) 25 MG tablet Take 25-50 mg by mouth every 6 (six) hours as needed for itching, allergies or sleep.    [provider]  docusate sodium (COLACE) 100 MG capsule Take 1 capsule (100 mg total) by mouth 2 (two) times daily as needed for mild constipation. 06/02/14   Susa Day, MD  [facility] SEAL PO Take 1 tablet by mouth 2 (two) times daily as needed (immune system boost).      [provider]  hydrochlorothiazide (HYDRODIURIL) 25 MG tablet Take 1 tablet (25 mg total) by mouth daily. Patient taking differently: Take 25-50 mg by mouth daily.  12/24/16   Lucille Passy, MD  HYDROcodone-acetaminophen (NORCO) 7.5-325 MG tablet Take 1-2 tablets by mouth every 4 (four) hours as needed for moderate pain ((score 4 to 6)). 08/02/17   Haddix, Thomasene Lot, MD  lisinopril (PRINIVIL,ZESTRIL) 10 MG tablet Take 1 tablet (10 mg total) by mouth daily. Patient taking differently: Take 9 mg by mouth at bedtime.  06/24/17   Lucille Passy, MD  Magnesium 500 MG TABS Take 500 mg by mouth at bedtime.    [provider]  Melatonin 10 MG TABS Take 1 tablet by mouth at bedtime.     [provider]  methocarbamol (ROBAXIN) 750 MG tablet Take 1 tablet (750 mg total) by mouth every 8 (eight) hours as needed for muscle spasms. 08/02/17   Haddix, Thomasene Lot, MD  Multiple Vitamin (MULTIVITAMIN WITH MINERALS) TABS tablet Take 1 tablet by mouth daily.    [provider]  Multiple Vitamins-Minerals (ZINC PO) Take 500 mg by mouth daily as needed (immune system boost).    [provider]  mupirocin ointment (BACTROBAN) 2 % Apply 1 application topically 2 (two) times daily. To affected area Patient taking differently: Apply 1 application topically 2 (two) times daily as needed (wound care).  12/28/14   Tower, Wynelle Fanny, MD  polyethylene glycol (MIRALAX / GLYCOLAX) packet Take 17 g by mouth daily as needed for moderate constipation or severe constipation. 08/03/17   Alma Friendly, MD  senna-docusate (SENOKOT-S) 8.6-50 MG tablet Take 1 tablet by mouth at bedtime as needed for mild constipation. 08/03/17   Alma Friendly, MD  sertraline (ZOLOFT) 100 MG tablet Take 1 tablet (100 mg total) by mouth daily. 06/24/17   Lucille Passy, MD  silver sulfADIAZINE (SILVADENE) 1 % cream Apply 1 application topically 2 (two) times daily. Patient taking differently: Apply 1 application  topically 2 (two) times daily as needed (wound care).  11/23/14   Pleas Koch, NP  St Johns Wort 300 MG CAPS Take 900 mg by mouth 2 (two) times daily.     [provider]  traMADol (ULTRAM) 50 MG tablet Take by mouth 2 (two) times daily as needed (back pain).    [provider]    Allergies Benzalkonium chloride; Neosporin [neomycin-bacitracin zn-polymyx]; Bee venom; Bupropion hcl; Cephalexin; and Latex  Family History  Problem Relation Age of Onset  . Heart attack Father 30  . Breast cancer Maternal Aunt 22    Social History Social History   Tobacco Use  . Smoking status: Former Smoker    Last attempt to quit: 05/26/1990    Years since quitting: 27.2  . Smokeless tobacco: Never Used  Substance Use Topics  . Alcohol use: No    Alcohol/week: 0.0 oz  . Drug  use: No    Review of Systems  Constitutional: No fever/chills Eyes: No visual changes. ENT: No sore throat. Cardiovascular: Denies chest pain. Respiratory: Denies shortness of breath. Gastrointestinal: No abdominal pain.  No nausea, no vomiting.  No diarrhea.  No constipation. Genitourinary: Negative for dysuria. Musculoskeletal: Negative for back pain. Skin: Negative for rash. Neurological: Negative for headaches, focal weakness or numbness.   ____________________________________________   PHYSICAL EXAM:  VITAL SIGNS: ED Triage Vitals  Enc Vitals Group     BP 08/21/17 1434 (!) 145/65     Pulse Rate 08/21/17 1434 89     Resp 08/21/17 1434 16     Temp 08/21/17 1434 98.1 F (36.7 C)     Temp Source 08/21/17 1434 Oral     SpO2 08/21/17 1434 99 %     Weight 08/21/17 1435 (!) 379 lb (171.9 kg)     Height 08/21/17 1435 5\' 6"  (1.676 m)     Head Circumference --      Peak Flow --      Pain Score 08/21/17 1435 6     Pain Loc --      Pain Edu? --      Excl. in [place]? --     Constitutional: Alert and oriented. Well appearing and in no acute distress. Eyes: Conjunctivae are normal.  Head:  Atraumatic. Nose: No congestion/rhinnorhea. Mouth/Throat: Mucous membranes are moist.  Neck: No stridor.   Cardiovascular: Normal rate, regular rhythm. Grossly normal heart sounds.  Respiratory: Normal respiratory effort.  No retractions. Lungs CTAB. Gastrointestinal: Soft and nontender. No distention. No CVA tenderness. Musculoskeletal: Ecchymosis to the right lower extremity, medially and just distal to the knee.  There are also several healing abrasions over the right anterior knee.  No ropelike structures of the posterior aspect of the lower extremity.  Right lateral ankle with small area of nodular density which appears that it could be scar tissue.  It is nontender, mobile and soft.  Wearing left upper extremity sling. Neurologic:  Normal speech and language. No gross focal neurologic deficits are appreciated. Skin:  Skin is warm, dry and intact. No rash noted. Psychiatric: Mood and affect are normal. Speech and behavior are normal.  ____________________________________________   LABS (all labs ordered are listed, but only abnormal results are displayed)  Labs Reviewed - No data to display ____________________________________________  EKG   ____________________________________________  RADIOLOGY  Patient found to have probable nonocclusive distal right popliteal vein DVT.   ____________________________________________   PROCEDURES  Procedure(s) performed:   Procedures  Critical Care performed:   ____________________________________________   INITIAL IMPRESSION / ASSESSMENT AND PLAN / ED COURSE  Pertinent labs & imaging results that were available during my care of the patient were reviewed by me and considered in my medical decision making (see chart for details).  DDX: Thrombophlebitis, DVT, ecchymosis, hematoma As part of my medical decision making, I reviewed the following data within the [place] Notes from prior ED  visits  ----------------------------------------- 5:35 PM on 08/21/2017 -----------------------------------------  Discussed case with Dr. Lucky Cowboy.  We will start the patient on Eliquis.  Patient understands her diagnosis and the need for treatment with a blood thinner.  We discussed precautions of the chest pain or shortness of breath as well as reporting the emergency department immediately for falls or hitting her head.  She is understanding of the possible side effects of the medication.  She denies any blood in her stool and denies any coagulation issues in the  past. ____________________________________________   FINAL CLINICAL IMPRESSION(S) / ED DIAGNOSES  DVT.    NEW MEDICATIONS STARTED DURING THIS VISIT:  New Prescriptions   No medications on file     Note:  This document was prepared using Dragon voice recognition software and may include unintentional dictation errors.     Orbie Pyo, MD 08/21/17 [phone]

## 2017-08-21 NOTE — Telephone Encounter (Signed)
TA-I spoke with Triage Nurse and due to the recent H/O surgery and being sedentary advised to go to ED to R/O DVT/thx dmf

## 2017-08-21 NOTE — Telephone Encounter (Signed)
Patient is calling to report that she has suspicious area in the back of her leg. There is a knot with a slight briuse area above it that is painful. She has had recent surgery and is worried about a DVT. After triage- checked with office and they are in agreement to send patient to ED for evaluation. Reason for Disposition . Patient sounds very sick or weak to the triager  Answer Assessment - Initial Assessment Questions 1. ONSET: "When did the pain start?"      Pain started 10 days ago- patient thought she was having muscle pain 2. LOCATION: "Where is the pain located?"      Right calf muscle- knot is at bottom 3. PAIN: "How bad is the pain?"    (Scale 1-10; or mild, moderate, severe)   -  MILD (1-3): doesn't interfere with normal activities    -  MODERATE (4-7): interferes with normal activities (e.g., work or school) or awakens from sleep, limping    -  SEVERE (8-10): excruciating pain, unable to do any normal activities, unable to walk     5- aching pain  Pushing pain- 7 4. WORK OR EXERCISE: "Has there been any recent work or exercise that involved this part of the body?"      surgery- 2/22  5. CAUSE: "What do you think is causing the leg pain?"     Possible DVT- patient had fall- injury was to shoulder/knee 6. OTHER SYMPTOMS: "Do you have any other symptoms?" (e.g., chest pain, back pain, breathing difficulty, swelling, rash, fever, numbness, weakness)     no 7. PREGNANCY: "Is there any chance you are pregnant?" "When was your last menstrual period?"     No- no cycle for 1 year  Protocols used: LEG PAIN-A-AH

## 2017-08-27 ENCOUNTER — Encounter: Payer: Self-pay | Admitting: Physical Therapy

## 2017-08-27 ENCOUNTER — Ambulatory Visit: Payer: BLUE CROSS/BLUE SHIELD | Admitting: Physical Therapy

## 2017-08-27 DIAGNOSIS — M25512 Pain in left shoulder: Secondary | ICD-10-CM | POA: Diagnosis not present

## 2017-08-27 DIAGNOSIS — M6281 Muscle weakness (generalized): Secondary | ICD-10-CM

## 2017-08-27 DIAGNOSIS — M25612 Stiffness of left shoulder, not elsewhere classified: Secondary | ICD-10-CM

## 2017-08-28 ENCOUNTER — Ambulatory Visit: Payer: BLUE CROSS/BLUE SHIELD | Admitting: Physical Therapy

## 2017-08-28 DIAGNOSIS — M25512 Pain in left shoulder: Secondary | ICD-10-CM | POA: Diagnosis not present

## 2017-08-28 DIAGNOSIS — M25612 Stiffness of left shoulder, not elsewhere classified: Secondary | ICD-10-CM

## 2017-08-28 DIAGNOSIS — M6281 Muscle weakness (generalized): Secondary | ICD-10-CM

## 2017-08-28 NOTE — Therapy (Signed)
[facility] [address], [place], [address] Phone: [phone]   Fax:  [phone]  Physical Therapy Treatment  Patient Details  Name: Danielle Irwin MRN: [id number] Date of Birth: 1971-03-01 Referring Provider: Katha Hamming MD   Encounter Date: 08/28/2017  PT End of Session - 08/28/17 1830    Visit Number  5    Number of Visits  12    Date for PT Re-Evaluation  09/25/17    Authorization Type  5 of 6 FOTO    PT Start Time  1731    PT Stop Time  1815    PT Time Calculation (min)  44 min    Activity Tolerance  Patient tolerated treatment well;Patient limited by pain    Behavior During Therapy  [facility] for tasks assessed/performed       Past Medical History:  Diagnosis Date  . Asthma   . Depression   . Family history of adverse reaction to anesthesia    FATHER WAS SLOW TO WAKE UP  . Headache    OCCASIONAL MIGRAINES  . HNP (herniated nucleus pulposus), lumbar   . Hx of epistaxis   . Insomnia   . OA (osteoarthritis)   . Obesity   . PCOS (polycystic ovarian syndrome)   . Sensitive skin   . Vitamin D deficiency     Past Surgical History:  Procedure Laterality Date  . LUMBAR LAMINECTOMY/DECOMPRESSION MICRODISCECTOMY Right 06/02/2014   Procedure: MICRO LUMBAR DECOMPRESSION L4 - L5 ON THE RIGHT 1 LEVEL;  Surgeon: Johnn Hai, MD;  Location: [facility];  Service: Orthopedics;  Laterality: Right;  . ORIF HUMERUS FRACTURE Left 08/01/2017   Procedure: OPEN REDUCTION INTERNAL FIXATION (ORIF) PROXIMAL HUMERUS FRACTURE;  Surgeon: Shona Needles, MD;  Location: [place];  Service: Orthopedics;  Laterality: Left;  . WISDOM TOOTH EXTRACTION      There were no vitals filed for this visit.  Subjective Assessment - 08/28/17 1748    Subjective  Patient reports increased soreness left UE elbow and upper arm/biceps last night, difficulty with sleeping    Pertinent History  Patient reports she fell into a storm door at  neighbors after stubbing toe on step 07/30/17. She felt immediate pain and numbness left UE and she could not move her left UE. She was transported to ED and then transported to [facility] in [place] and underwent surgery with ORIF 08/01/17 and released to home 08/03/17.     Limitations  Lifting;Walking;House hold activities out of work; personal care; staying at parent;s due to limitations    Patient Stated Goals  to be able to use her arm again for personal care, return to all ADL's with minimal difficutly    Currently in Pain?  Yes    Pain Score  6     Pain Location  Shoulder    Pain Orientation  Left    Pain Descriptors / Indicators  Aching;Sore    Pain Type  Acute pain;Surgical pain    Pain Onset  1 to 4 weeks ago    Pain Frequency  Constant         Objective: Patient arrived with sling in place AAROM left elbow WNL with discomfort/pain end range extension  Observation: ecchymosis posterior aspect left forearm and elbow mild   Treatment:  Manual therapy: 69min.  STM superficial techniques to left elbow forearm at biceps attachment to improve soft tissue elasticity and improve ROM with less pain in elbow  Therapeutic exercise: patient performed with assistance, verbal and tactile cues of therapist:  PROM/AAROM left shoulder multiple reps 3 sets: up to 90 degrees  flexion, full ROM left elbow flexion/extension   Modalities: Electrical stimulation: 15 XAJ:OINOMVE stim. 10/10 cycle applied (2) electrodes to left shoulder periscapular muscles rhomboids/lower trapezius with patient reclined with left UE supported on pillow; ice pack placed over left shoulder during treatment:  goal muscle re education; pain: no adverse reactions noted   Patient response to treatment: increased pain left shoulder with AAROM, less with PROM. improved soft tissue elasticity by 50% following STM.     PT Education - 08/28/17 1800    Education provided  Yes    Education Details  exercise instruction;  use of heat/ice for pain control    Person(s) Educated  Patient    Methods  Explanation    Comprehension  Verbalized understanding          PT Long Term Goals - 08/14/17 1832      PT LONG TERM GOAL #1   Title  Patient will improve FOTO score to 20/100 demonstrating improvement with functional use left UE for daily tasks    Baseline  FOTO 4/100    Status  New    Target Date  09/05/17      PT LONG TERM GOAL #2   Title  Patient will improve FOTO score to 30/100 demonstrating improvement with functional use left UE for daily tasks    Baseline  FOTO 4/100    Status  New    Target Date  09/25/17      PT LONG TERM GOAL #3   Title  patient will be independent with home exercises for flexibility and strength to allow transition to self management once discharged from physical therapy    Baseline  requires assistance for ROM left shoulder, guidance and cuing to perform all exercises    Status  New    Target Date  09/25/17            Plan - 08/28/17 1816    Clinical Impression Statement  Patient with increased pain with AAROM and improved with PROM left shoulder forward elevation. she required moderate assistance and cuing to perfomr exercises through increased ROM. patient reported improved pain at end of session following ice and estim.     Rehab Potential  Good    Clinical Impairments Affecting Rehab Potential  (+) motivated, acute condition (-)obesity, DDD, lumbar     PT Frequency  2x / week    PT Duration  6 weeks    PT Treatment/Interventions  Manual techniques;Neuromuscular re-education;Patient/family education;Cryotherapy;Electrical Stimulation;Moist Heat;Therapeutic exercise;Therapeutic activities;Balance training;Scar mobilization;Passive range of motion    PT Next Visit Plan  pain control, progressive exercises PROM/AAROM shoulder and AROM elbow to hand to improve control/strength left UE    PT Home Exercise Plan  AROM left elbow, wrist, hand, pendulum exercise for  shoulder       Patient will benefit from skilled therapeutic intervention in order to improve the following deficits and impairments:  Pain, Impaired UE functional use, Impaired perceived functional ability, Decreased strength, Decreased range of motion, Decreased endurance, Decreased activity tolerance, Decreased balance, Obesity  Visit Diagnosis: Acute pain of left shoulder  Muscle weakness (generalized)  Stiffness of left shoulder, not elsewhere classified     Problem List Patient Active Problem List   Diagnosis Date Noted  . Leukocytosis 08/06/2017  . Humerus fracture 08/01/2017  . CPAP (continuous positive airway pressure) dependence 08/01/2017  .  Asthma 08/01/2017  . HTN (hypertension) 06/24/2017  . Depression 11/07/2015  . Systolic murmur 43/15/4008  . Morbid obesity ([place]) 10/26/2014  . Insomnia 06/21/2014  . Anxiety 06/21/2014  . Spinal stenosis at L4-L5 level 06/02/2014  . POLYCYSTIC OVARIAN DISEASE 06/19/2010    Jomarie Longs PT 08/28/2017, 10:56 PM  [facility] [address] [place], [address] Phone: [phone]   Fax:  [phone]  Name: Danielle Irwin MRN: [id number] Date of Birth: May 29, 1971

## 2017-08-28 NOTE — Therapy (Signed)
[facility] [address], [place], [address] Phone: [phone]   Fax:  [phone]  Physical Therapy Treatment  Patient Details  Name: Danielle Irwin MRN: [id number] Date of Birth: 12/23/1970 Referring Provider: Katha Hamming MD   Encounter Date: 08/27/2017  PT End of Session - 08/27/17 1655    Visit Number  4    Number of Visits  12    Date for PT Re-Evaluation  09/25/17    Authorization Type  4 of 6 FOTO    PT Start Time  3536    PT Stop Time  1730    PT Time Calculation (min)  43 min    Activity Tolerance  Patient tolerated treatment well;Patient limited by pain    Behavior During Therapy  [facility] for tasks assessed/performed       Past Medical History:  Diagnosis Date  . Asthma   . Depression   . Family history of adverse reaction to anesthesia    FATHER WAS SLOW TO WAKE UP  . Headache    OCCASIONAL MIGRAINES  . HNP (herniated nucleus pulposus), lumbar   . Hx of epistaxis   . Insomnia   . OA (osteoarthritis)   . Obesity   . PCOS (polycystic ovarian syndrome)   . Sensitive skin   . Vitamin D deficiency     Past Surgical History:  Procedure Laterality Date  . LUMBAR LAMINECTOMY/DECOMPRESSION MICRODISCECTOMY Right 06/02/2014   Procedure: MICRO LUMBAR DECOMPRESSION L4 - L5 ON THE RIGHT 1 LEVEL;  Surgeon: Johnn Hai, MD;  Location: [facility];  Service: Orthopedics;  Laterality: Right;  . ORIF HUMERUS FRACTURE Left 08/01/2017   Procedure: OPEN REDUCTION INTERNAL FIXATION (ORIF) PROXIMAL HUMERUS FRACTURE;  Surgeon: Shona Needles, MD;  Location: [facility];  Service: Orthopedics;  Laterality: Left;  . WISDOM TOOTH EXTRACTION      There were no vitals filed for this visit.  Subjective Assessment - 08/27/17 2004    Subjective  Patient reports having to go to ER last week and was diagnosed with blood clot in right ankle and is now on blood thinners. She continues with pain in right UE upper arm.     Pertinent History  Patient reports she fell into a storm door at neighbors after stubbing toe on step 07/30/17. She felt immediate pain and numbness left UE and she could not move her left UE. She was transported to ED and then transported to [facility] in [place] and underwent surgery with ORIF 08/01/17 and released to home 08/03/17.     Limitations  Lifting;Walking;House hold activities out of work; personal care; staying at parent;s due to limitations    Patient Stated Goals  to be able to use her arm again for personal care, return to all ADL's with minimal difficutly    Currently in Pain?  Yes    Pain Score  5     Pain Location  Shoulder    Pain Orientation  Left    Pain Descriptors / Indicators  Aching;Sore    Pain Type  Acute pain    Pain Onset  1 to 4 weeks ago    Pain Frequency  Constant          Objective: Patient arrived with sling in place AAROM left elbow WNL with discomfort/pain end range extension  Observation: ecchymosis posterior aspect left forearm and elbow mild   Treatment:  Manual therapy: 56min.  STM superficial techniques to left elbow  forearm at biceps attachment to improve soft tissue elasticity and improve ROM with less pain in elbow   Therapeutic exercise: patient performed with assistance, verbal and tactile cues of therapist:  PROM/AAROM left shoulder multiple reps 3 sets: up to 90 degrees  flexion, full ROM left elbow flexion/extension   Modalities: Electrical stimulation: 15 [id number] stim. 10/10 cycle applied (2) electrodes to left shoulder periscapular muscles rhomboids/lower trapezius with patient reclined with left UE supported on pillow; ice pack placed over left shoulder during treatment:  goal muscle re education; pain: no adverse reactions noted   Patient response to treatment: Improved ROM with repetition and cuing to relax during movement. imrpoved soft tissue elasticity left elbow following STM allowing full ROM in left elbow. Pain level post  treatment 4/10   PT Education - 08/27/17 1655    Education provided  Yes    Education Details  exercise insruction for technique    Methods  Explanation;Verbal cues    Comprehension  Verbalized understanding;Verbal cues required          PT Long Term Goals - 08/14/17 1832      PT LONG TERM GOAL #1   Title  Patient will improve FOTO score to 20/100 demonstrating improvement with functional use left UE for daily tasks    Baseline  FOTO 4/100    Status  New    Target Date  09/05/17      PT LONG TERM GOAL #2   Title  Patient will improve FOTO score to 30/100 demonstrating improvement with functional use left UE for daily tasks    Baseline  FOTO 4/100    Status  New    Target Date  09/25/17      PT LONG TERM GOAL #3   Title  patient will be independent with home exercises for flexibility and strength to allow transition to self management once discharged from physical therapy    Baseline  requires assistance for ROM left shoulder, guidance and cuing to perform all exercises    Status  New    Target Date  09/25/17            Plan - 08/27/17 2007    Clinical Impression Statement  Patient unable to tolerate much ROM exercises due to pain intensity. She is progressing steadily with ROM for left UE elbow and shoulder and responded well to ice and estim. at end of session with reported decreased pain.     Rehab Potential  Good    Clinical Impairments Affecting Rehab Potential  (+) motivated, acute condition (-)obesity, DDD, lumbar     PT Frequency  2x / week    PT Duration  6 weeks    PT Treatment/Interventions  Manual techniques;Neuromuscular re-education;Patient/family education;Cryotherapy;Electrical Stimulation;Moist Heat;Therapeutic exercise;Therapeutic activities;Balance training;Scar mobilization;Passive range of motion    PT Next Visit Plan  pain control, progressive exercises PROM/AAROM shoulder and AROM elbow to hand to improve control/strength left UE    PT Home  Exercise Plan  AROM left elbow, wrist, hand, pendulum exercise for shoulder       Patient will benefit from skilled therapeutic intervention in order to improve the following deficits and impairments:  Pain, Impaired UE functional use, Impaired perceived functional ability, Decreased strength, Decreased range of motion, Decreased endurance, Decreased activity tolerance, Decreased balance, Obesity  Visit Diagnosis: Acute pain of left shoulder  Muscle weakness (generalized)  Stiffness of left shoulder, not elsewhere classified     Problem List Patient Active Problem List   Diagnosis  Date Noted  . Leukocytosis 08/06/2017  . Humerus fracture 08/01/2017  . CPAP (continuous positive airway pressure) dependence 08/01/2017  . Asthma 08/01/2017  . HTN (hypertension) 06/24/2017  . Depression 11/07/2015  . Systolic murmur 24/81/8590  . Morbid obesity ([place]) 10/26/2014  . Insomnia 06/21/2014  . Anxiety 06/21/2014  . Spinal stenosis at L4-L5 level 06/02/2014  . POLYCYSTIC OVARIAN DISEASE 06/19/2010    Jomarie Longs PT 08/28/2017, 8:10 PM  [facility] [address] [place], [address] Phone: [phone]   Fax:  [phone]  Name: Danielle Irwin MRN: [id number] Date of Birth: [birth year]

## 2017-08-29 ENCOUNTER — Ambulatory Visit (INDEPENDENT_AMBULATORY_CARE_PROVIDER_SITE_OTHER): Payer: BLUE CROSS/BLUE SHIELD | Admitting: Vascular Surgery

## 2017-08-29 ENCOUNTER — Encounter (INDEPENDENT_AMBULATORY_CARE_PROVIDER_SITE_OTHER): Payer: Self-pay | Admitting: Vascular Surgery

## 2017-08-29 VITALS — BP 147/68 | HR 90 | Resp 17 | Ht 66.5 in | Wt 362.0 lb

## 2017-08-29 DIAGNOSIS — I82431 Acute embolism and thrombosis of right popliteal vein: Secondary | ICD-10-CM

## 2017-08-29 DIAGNOSIS — I10 Essential (primary) hypertension: Secondary | ICD-10-CM

## 2017-08-29 DIAGNOSIS — I82409 Acute embolism and thrombosis of unspecified deep veins of unspecified lower extremity: Secondary | ICD-10-CM | POA: Insufficient documentation

## 2017-08-29 NOTE — Progress Notes (Signed)
Patient ID: Danielle Irwin, female   DOB: [birth year], 47 y.o.   MRN: [id number]  Chief Complaint  Patient presents with  . New Patient (Initial Visit)    DVT, on Eliquis    HPI Danielle Irwin is a 47 y.o. female.  I am asked to see the patient by Dr. Clearnce Hasten in the ER for evaluation of right leg DVT.  The patient reports several weeks ago having a significant fall landing on her right knee and her left shoulder.  She subsequently had a large hematoma and swelling in her right leg as well as required surgery for her left shoulder injury.  She had decreased mobility for a week or 2.  Her right leg began to have some swelling and pain.  It was like a toothache or a dull pain that would not get better.  This lasted for about a week before she was sent to the emergency department by her primary care physician and was found to have a nonocclusive right popliteal vein DVT.  No proximal right leg DVT was seen.  She had no previous history of DVT to her knowledge.  No chest pain or shortness of breath.  No fever or chills.  She was started on Eliquis and within about 2 days her leg started to feel significantly better.  She has very mild pain at this point and no appreciable swelling.   Past Medical History:  Diagnosis Date  . Asthma   . Depression   . Family history of adverse reaction to anesthesia    FATHER WAS SLOW TO WAKE UP  . Headache    OCCASIONAL MIGRAINES  . HNP (herniated nucleus pulposus), lumbar   . Hx of epistaxis   . Insomnia   . OA (osteoarthritis)   . Obesity   . PCOS (polycystic ovarian syndrome)   . Sensitive skin   . Vitamin D deficiency     Past Surgical History:  Procedure Laterality Date  . LUMBAR LAMINECTOMY/DECOMPRESSION MICRODISCECTOMY Right 06/02/2014   Procedure: MICRO LUMBAR DECOMPRESSION L4 - L5 ON THE RIGHT 1 LEVEL;  Surgeon: Johnn Hai, MD;  Location: [facility];  Service: Orthopedics;  Laterality: Right;  . ORIF HUMERUS FRACTURE Left 08/01/2017   Procedure: OPEN REDUCTION INTERNAL FIXATION (ORIF) PROXIMAL HUMERUS FRACTURE;  Surgeon: Shona Needles, MD;  Location: [facility];  Service: Orthopedics;  Laterality: Left;  . WISDOM TOOTH EXTRACTION      Family History  Problem Relation Age of Onset  . Heart attack Father 41  . Breast cancer Maternal Aunt 50  No bleeding or clotting disorders  Social History Social History   Tobacco Use  . Smoking status: Former Smoker    Last attempt to quit: 05/26/1990    Years since quitting: 27.2  . Smokeless tobacco: Never Used  Substance Use Topics  . Alcohol use: No    Alcohol/week: 0.0 oz  . Drug use: No    Allergies  Allergen Reactions  . Benzalkonium Chloride Anaphylaxis and Hives  . Neosporin [Neomycin-Bacitracin Zn-Polymyx] Anaphylaxis and Hives  . Bee Venom Hives and Swelling    Throat swelling  . Bupropion Hcl Other (See Comments)    homicidal  . Cephalexin Hives and Swelling  . Latex     Current Outpatient Medications  Medication Sig Dispense Refill  . albuterol (VENTOLIN HFA) 108 (90 Base) MCG/ACT inhaler Inhale 2 puffs into the lungs every 6 (six) hours as needed. (Patient taking differently: Inhale 2 puffs into the lungs  every 6 (six) hours as needed for wheezing or shortness of breath. ) 1 Inhaler 1  . Alum & Mag Hydroxide-Simeth (ANTACID ANTI-GAS PO) Take 1 tablet by mouth daily as needed (for gas).    . Ascorbic Acid (VITAMIN C) 1000 MG tablet Take 1,000 mg by mouth 2 (two) times daily.    Marland Kitchen aspirin EC 81 MG tablet Take 81 mg by mouth daily.    Marland Kitchen aspirin-acetaminophen-caffeine (EXCEDRIN MIGRAINE) 250-250-65 MG tablet Take 2 tablets by mouth 2 (two) times daily as needed for headache or migraine.    Marland Kitchen b complex vitamins tablet Take 1 tablet by mouth daily.    . Calcium Carbonate-Vitamin D (CALCIUM-D PO) Take 1,200 mg by mouth 2 (two) times daily.    . Cholecalciferol (VITAMIN D-3) 5000 units TABS Take 5,000 Units by mouth 2 (two) times daily.    . diphenhydrAMINE  (BENADRYL) 25 MG tablet Take 25-50 mg by mouth every 6 (six) hours as needed for itching, allergies or sleep.    Marland Kitchen docusate sodium (COLACE) 100 MG capsule Take 1 capsule (100 mg total) by mouth 2 (two) times daily as needed for mild constipation. 20 capsule 1  . ECHINACEA-GOLDEN SEAL PO Take 1 tablet by mouth 2 (two) times daily as needed (immune system boost).     Marland Kitchen ELIQUIS STARTER PACK (ELIQUIS STARTER PACK) 5 MG TABS Take as directed on package: start with two-'5mg'$  tablets twice daily for 7 days. On day 8, switch to one-'5mg'$  tablet twice daily. 1 each 0  . hydrochlorothiazide (HYDRODIURIL) 25 MG tablet Take 1 tablet (25 mg total) by mouth daily. (Patient taking differently: Take 25-50 mg by mouth daily. ) 30 tablet 11  . HYDROcodone-acetaminophen (NORCO) 7.5-325 MG tablet Take 1-2 tablets by mouth every 4 (four) hours as needed for moderate pain ((score 4 to 6)). 40 tablet 0  . lisinopril (PRINIVIL,ZESTRIL) 10 MG tablet Take 1 tablet (10 mg total) by mouth daily. (Patient taking differently: Take 9 mg by mouth at bedtime. ) 90 tablet 3  . Magnesium 500 MG TABS Take 500 mg by mouth at bedtime.    . Melatonin 10 MG TABS Take 1 tablet by mouth at bedtime.     . methocarbamol (ROBAXIN) 750 MG tablet Take 1 tablet (750 mg total) by mouth every 8 (eight) hours as needed for muscle spasms. 30 tablet 0  . Multiple Vitamin (MULTIVITAMIN WITH MINERALS) TABS tablet Take 1 tablet by mouth daily.    . Multiple Vitamins-Minerals (ZINC PO) Take 500 mg by mouth daily as needed (immune system boost).    . mupirocin ointment (BACTROBAN) 2 % Apply 1 application topically 2 (two) times daily. To affected area (Patient taking differently: Apply 1 application topically 2 (two) times daily as needed (wound care). ) 15 g 1  . polyethylene glycol (MIRALAX / GLYCOLAX) packet Take 17 g by mouth daily as needed for moderate constipation or severe constipation. 14 each 0  . senna-docusate (SENOKOT-S) 8.6-50 MG tablet Take 1  tablet by mouth at bedtime as needed for mild constipation. 15 tablet 0  . sertraline (ZOLOFT) 100 MG tablet Take 1 tablet (100 mg total) by mouth daily. 30 tablet 3  . silver sulfADIAZINE (SILVADENE) 1 % cream Apply 1 application topically 2 (two) times daily. (Patient taking differently: Apply 1 application topically 2 (two) times daily as needed (wound care). ) 50 g 0  . St Johns Wort 300 MG CAPS Take 900 mg by mouth 2 (two) times daily.     Marland Kitchen  traMADol (ULTRAM) 50 MG tablet Take by mouth 2 (two) times daily as needed (back pain).     No current facility-administered medications for this visit.       REVIEW OF SYSTEMS (Negative unless checked)  Constitutional: '[]'$ Weight loss  '[]'$ Fever  '[]'$ Chills Cardiac: '[]'$ Chest pain   '[]'$ Chest pressure   '[]'$ Palpitations   '[]'$ Shortness of breath when laying flat   '[]'$ Shortness of breath at rest   '[]'$ Shortness of breath with exertion. Vascular:  '[]'$ Pain in legs with walking   '[]'$ Pain in legs at rest   '[]'$ Pain in legs when laying flat   '[]'$ Claudication   '[]'$ Pain in feet when walking  '[]'$ Pain in feet at rest  '[]'$ Pain in feet when laying flat   '[x]'$ History of DVT   '[x]'$ Phlebitis   '[x]'$ Swelling in legs   '[]'$ Varicose veins   '[]'$ Non-healing ulcers Pulmonary:   '[]'$ Uses home oxygen   '[]'$ Productive cough   '[]'$ Hemoptysis   '[]'$ Wheeze  '[]'$ COPD   '[]'$ Asthma Neurologic:  '[]'$ Dizziness  '[]'$ Blackouts   '[]'$ Seizures   '[]'$ History of stroke   '[]'$ History of TIA  '[]'$ Aphasia   '[]'$ Temporary blindness   '[]'$ Dysphagia   '[]'$ Weakness or numbness in arms   '[]'$ Weakness or numbness in legs Musculoskeletal:  '[x]'$ Arthritis   '[]'$ Joint swelling   '[]'$ Joint pain   '[]'$ Low back pain Hematologic:  '[x]'$ Easy bruising  '[]'$ Easy bleeding   '[]'$ Hypercoagulable state   '[]'$ Anemic  '[]'$ Hepatitis Gastrointestinal:  '[]'$ Blood in stool   '[]'$ Vomiting blood  '[]'$ Gastroesophageal reflux/heartburn   '[]'$ Abdominal pain Genitourinary:  '[]'$ Chronic kidney disease   '[]'$ Difficult urination  '[]'$ Frequent urination  '[]'$ Burning with urination   '[]'$ Hematuria Skin:  '[]'$ Rashes   '[]'$ Ulcers    '[]'$ Wounds Psychological:  '[]'$ History of anxiety   '[]'$  History of major depression.    Physical Exam BP (!) 147/68 (BP Location: Right Arm, Patient Position: Sitting)   Pulse 90   Resp 17   Ht 5' 6.5" (1.689 m)   Wt (!) 164.2 kg (362 lb)   BMI 57.55 kg/m  Gen:  WD/WN, NAD. Obese  Head: [place]/AT, No temporalis wasting.  Ear/Nose/Throat: Hearing grossly intact, nares w/o erythema or drainage, oropharynx w/o Erythema/Exudate Eyes: Conjunctiva clear, sclera non-icteric  Neck: trachea midline.  No JVD.  Pulmonary:  Good air movement, respirations not labored, no use of accessory muscles Cardiac: RRR Vascular:  Vessel Right Left  Radial Palpable Palpable                          PT Palpable Palpable  DP Palpable Palpable    Musculoskeletal: M/S 5/5 throughout.  Extremities without ischemic changes.  Left arm is in a sling. no appreciable edema. Neurologic: Sensation grossly intact in extremities.  Symmetrical.  Speech is fluent. Motor exam as listed above. Psychiatric: Judgment intact, Mood & affect appropriate for pt's clinical situation. Dermatologic: No rashes or ulcers noted.  No cellulitis or open wounds.  Radiology US Venous Img Lower Unilateral Right  Result Date: 08/21/2017 CLINICAL DATA:  Right calf pain. EXAM: RIGHT LOWER EXTREMITY VENOUS DOPPLER ULTRASOUND TECHNIQUE: Gray-scale sonography with graded compression, as well as color Doppler and duplex ultrasound were performed to evaluate the lower extremity deep venous systems from the level of the common femoral vein and including the common femoral, femoral, profunda femoral, popliteal and calf veins including the posterior tibial, peroneal and gastrocnemius veins when visible. The superficial great saphenous vein was also interrogated. Spectral Doppler was utilized to evaluate flow at rest and with distal augmentation maneuvers in the common femoral,  femoral and popliteal veins. COMPARISON:  None. FINDINGS: Contralateral  Common Femoral Vein: Respiratory phasicity is normal and symmetric with the symptomatic side. No evidence of thrombus. Normal compressibility. Common Femoral Vein: No evidence of thrombus. Normal compressibility, respiratory phasicity and response to augmentation. Saphenofemoral Junction: No evidence of thrombus. Normal compressibility and flow on color Doppler imaging. Profunda Femoral Vein: No evidence of thrombus. Normal compressibility and flow on color Doppler imaging. Femoral Vein: No evidence of thrombus. Normal compressibility, respiratory phasicity and response to augmentation. Popliteal Vein: There is suggestion nonocclusive thrombus at the level of the distal popliteal vein. Calf Veins: No evidence of thrombus. Normal compressibility and flow on color Doppler imaging. Superficial Great Saphenous Vein: No evidence of thrombus. Normal compressibility. Venous Reflux:  None. Other Findings: No evidence of superficial thrombophlebitis or abnormal fluid collection. IMPRESSION: Probable nonocclusive distal right popliteal vein DVT. Electronically Signed   By: Aletta Edouard M.D.   On: 08/21/2017 16:55   Dg Chest Port 1 View  Result Date: 08/01/2017 CLINICAL DATA:  Preoperative evaluation for humeral fracture fixation EXAM: PORTABLE CHEST 1 VIEW COMPARISON:  Left humerus July 30, 2017 FINDINGS: Lungs are clear. Heart is upper normal in size with pulmonary vascularity within normal limits. No adenopathy. No pneumothorax. No rib fractures evident. The left humerus is not seen in appear separated from the glenoid. IMPRESSION: Separation of proximal left humerus from the glenoid, undoubtedly due to the traumatic changes in the proximal left humerus noted on recent left humerus radiographs. No edema or consolidation. Heart upper normal in size. No pneumothorax. Electronically Signed   By: Lowella Grip III M.D.   On: 08/01/2017 07:14   Dg Shoulder Left  Result Date: 08/01/2017 CLINICAL DATA:  ORIF.  EXAM: DG C-ARM 61-120 MIN; LEFT SHOULDER - 2+ VIEW COMPARISON:  07/30/2017. FINDINGS: ORIF proximal left humeral fracture. Hardware intact. Near anatomic alignment. IMPRESSION: ORIF proximal left humeral fracture. Electronically Signed   By: Marcello Moores  Register   On: 08/01/2017 12:00   Dg Shoulder Left  Result Date: 07/31/2017 CLINICAL DATA:  Fall onto shoulder EXAM: LEFT SHOULDER - 2+ VIEW COMPARISON:  None. FINDINGS: AC joint appears intact. There is an acute comminuted and separated fracture involving the left humeral head, neck and proximal shaft. Superiorly positioned greater tuberosity fracture fragment. Humeral head positioned over the glenoid on Y-view but appears inferiorly positioned on the frontal view. IMPRESSION: Acute, comminuted and displaced fracture involving the left humeral head, neck and proximal shaft with inferior positioning of the left humeral head with respect to the glenoid fossa. Electronically Signed   By: Donavan Foil M.D.   On: 07/31/2017 00:12   Dg Shoulder Left Port  Result Date: 08/01/2017 CLINICAL DATA:  Followup ORIF. EXAM: LEFT SHOULDER - 1 VIEW COMPARISON:  07/30/2017 FINDINGS: Two-view show ORIF of a proximal humeral fracture with a lateral plate and multiple screws. Humeral head appears properly related to the glenoid. Improved position and alignment compared to the preoperative studies. IMPRESSION: ORIF with lateral plate and multiple screws. Improved position and alignment. Electronically Signed   By: Nelson Chimes M.D.   On: 08/01/2017 15:02   Dg Humerus Left  Result Date: 07/31/2017 CLINICAL DATA:  [place] onto left shoulder EXAM: LEFT HUMERUS - 2+ VIEW COMPARISON:  None. FINDINGS: Comminuted and displaced proximal humerus fracture. The mid to distal humerus is intact. IMPRESSION: Acute comminuted and displaced proximal humerus fracture. Electronically Signed   By: Donavan Foil M.D.   On: 07/31/2017 00:10   Dg C-arm 1-60  Min  Result Date: 08/01/2017 CLINICAL  DATA:  ORIF. EXAM: DG C-ARM 61-120 MIN; LEFT SHOULDER - 2+ VIEW COMPARISON:  07/30/2017. FINDINGS: ORIF proximal left humeral fracture. Hardware intact. Near anatomic alignment. IMPRESSION: ORIF proximal left humeral fracture. Electronically Signed   By: Marcello Moores  Register   On: 08/01/2017 12:00   Dg C-arm 1-60 Min  Result Date: 08/01/2017 CLINICAL DATA:  ORIF. EXAM: DG C-ARM 61-120 MIN; LEFT SHOULDER - 2+ VIEW COMPARISON:  07/30/2017. FINDINGS: ORIF proximal left humeral fracture. Hardware intact. Near anatomic alignment. IMPRESSION: ORIF proximal left humeral fracture. Electronically Signed   By: Marcello Moores  Register   On: 08/01/2017 12:00   Dg C-arm 1-60 Min  Result Date: 08/01/2017 CLINICAL DATA:  ORIF. EXAM: DG C-ARM 61-120 MIN; LEFT SHOULDER - 2+ VIEW COMPARISON:  07/30/2017. FINDINGS: ORIF proximal left humeral fracture. Hardware intact. Near anatomic alignment. IMPRESSION: ORIF proximal left humeral fracture. Electronically Signed   By: Marcello Moores  Register   On: 08/01/2017 12:00    Labs Recent Results (from the past 2160 hour(s))  Comprehensive metabolic panel     Status: Abnormal   Collection Time: 08/01/17  2:50 AM  Result Value Ref Range   Sodium 137 135 - 145 mmol/L   Potassium 4.3 3.5 - 5.1 mmol/L   Chloride 105 101 - 111 mmol/L   CO2 23 22 - 32 mmol/L   Glucose, Bld 152 (H) 65 - 99 mg/dL   BUN 15 6 - 20 mg/dL   Creatinine, Ser 0.73 0.44 - 1.00 mg/dL   Calcium 8.4 (L) 8.9 - 10.3 mg/dL   Total Protein 6.6 6.5 - 8.1 g/dL   Albumin 3.5 3.5 - 5.0 g/dL   AST 26 15 - 41 U/L   ALT 35 14 - 54 U/L   Alkaline Phosphatase 55 38 - 126 U/L   Total Bilirubin 1.0 0.3 - 1.2 mg/dL   GFR calc non Af Amer >60 >60 mL/min   GFR calc Af Amer >60 >60 mL/min    Comment: (NOTE) The eGFR has been calculated using the CKD EPI equation. This calculation has not been validated in all clinical situations. eGFR's persistently <60 mL/min signify possible Chronic Kidney Disease.    Anion gap 9 5 - 15     Comment: Performed at [facility] [address] [place], [place] [address]  CBC with Differential/Platelet     Status: Abnormal   Collection Time: 08/01/17  2:50 AM  Result Value Ref Range   WBC 11.3 (H) 4.0 - 10.5 K/uL   RBC 3.69 (L) 3.87 - 5.11 MIL/uL   Hemoglobin 11.3 (L) 12.0 - 15.0 g/dL   HCT 34.7 (L) 36.0 - 46.0 %   MCV 94.0 78.0 - 100.0 fL   MCH 30.6 26.0 - 34.0 pg   MCHC 32.6 30.0 - 36.0 g/dL   RDW 13.0 11.5 - 15.5 %   Platelets 238 150 - 400 K/uL   Neutrophils Relative % 66 %   Neutro Abs 7.4 1.7 - 7.7 K/uL   Lymphocytes Relative 26 %   Lymphs Abs 2.9 0.7 - 4.0 K/uL   Monocytes Relative 8 %   Monocytes Absolute 0.9 0.1 - 1.0 K/uL   Eosinophils Relative 0 %   Eosinophils Absolute 0.1 0.0 - 0.7 K/uL   Basophils Relative 0 %   Basophils Absolute 0.0 0.0 - 0.1 K/uL    Comment: Performed at [facility] [address], [place], [place] [address]  Type and screen [place]  Status: None   Collection Time: 08/01/17  2:58 AM  Result Value Ref Range   ABO/RH(D) A POS    Antibody Screen NEG    Sample Expiration      08/04/2017 Performed at [facility], [place] [address], [place], [place] [address]   ABO/Rh     Status: None   Collection Time: 08/01/17  2:58 AM  Result Value Ref Range   ABO/RH(D)      A POS Performed at [facility] [address], [place], [place] [address]   MRSA PCR Screening     Status: None   Collection Time: 08/01/17  6:02 AM  Result Value Ref Range   MRSA by PCR NEGATIVE NEGATIVE    Comment:        The GeneXpert MRSA Assay (FDA approved for NASAL specimens only), is one component of a comprehensive MRSA colonization surveillance program. It is not intended to diagnose MRSA infection nor to guide or monitor treatment for MRSA infections. Performed at [facility], [place] [address], [place], [place] [address]   I-STAT 4, (NA,K, GLUC, HGB,HCT)     Status: Abnormal   Collection Time:  08/01/17 11:31 AM  Result Value Ref Range   Sodium 140 135 - 145 mmol/L   Potassium 4.3 3.5 - 5.1 mmol/L   Glucose, Bld 151 (H) 65 - 99 mg/dL   HCT 29.0 (L) 36.0 - 46.0 %   Hemoglobin 9.9 (L) 12.0 - 15.0 g/dL  Glucose, capillary     Status: Abnormal   Collection Time: 08/01/17 11:29 PM  Result Value Ref Range   Glucose-Capillary 198 (H) 65 - 99 mg/dL  HIV antibody (Routine Testing)     Status: None   Collection Time: 08/02/17  5:24 AM  Result Value Ref Range   HIV Screen 4th Generation wRfx Non Reactive Non Reactive    Comment: (NOTE) Performed At: [facility] [address] [place], [place] [id number] Rush Farmer MD [id number] Performed at [facility], [place] [address], [place], [place] [address]   CBC     Status: Abnormal   Collection Time: 08/02/17  5:24 AM  Result Value Ref Range   WBC 14.3 (H) 4.0 - 10.5 K/uL   RBC 3.08 (L) 3.87 - 5.11 MIL/uL   Hemoglobin 9.2 (L) 12.0 - 15.0 g/dL   HCT 28.9 (L) 36.0 - 46.0 %   MCV 93.8 78.0 - 100.0 fL   MCH 29.9 26.0 - 34.0 pg   MCHC 31.8 30.0 - 36.0 g/dL   RDW 13.1 11.5 - 15.5 %   Platelets 236 150 - 400 K/uL    Comment: Performed at [facility], [place] [address] [place],  [address]  Basic metabolic panel     Status: Abnormal   Collection Time: 08/02/17  5:24 AM  Result Value Ref Range   Sodium 137 135 - 145 mmol/L   Potassium 4.1 3.5 - 5.1 mmol/L   Chloride 101 101 - 111 mmol/L   CO2 26 22 - 32 mmol/L   Glucose, Bld 132 (H) 65 - 99 mg/dL   BUN 8 6 - 20 mg/dL   Creatinine, Ser 0.69 0.44 - 1.00 mg/dL   Calcium 8.2 (L) 8.9 - 10.3 mg/dL   GFR calc non Af Amer >60 >60 mL/min   GFR calc Af Amer >60 >60 mL/min    Comment: (NOTE) The eGFR has been calculated using the CKD EPI equation. This calculation has not been  validated in all clinical situations. eGFR's persistently <60 mL/min signify possible Chronic Kidney Disease.    Anion gap 10 5 - 15    Comment: Performed at [facility] [address], [place], [place] [address]  Iron and TIBC     Status: None   Collection Time: 08/02/17  5:24 AM  Result Value Ref Range   Iron 38 28 - 170 ug/dL   TIBC 279 250 - 450 ug/dL   Saturation Ratios 14 10.4 - 31.8 %   UIBC 241 ug/dL    Comment: Performed at [facility], [place] [address] [place], [place] [address]  Ferritin     Status: None   Collection Time: 08/02/17  5:24 AM  Result Value Ref Range   Ferritin 60 11 - 307 ng/mL    Comment: Performed at [facility], [place] [address] [place], [place] [address]  Glucose, capillary     Status: Abnormal   Collection Time: 08/02/17  8:33 AM  Result Value Ref Range   Glucose-Capillary 167 (H) 65 - 99 mg/dL  Glucose, capillary     Status: Abnormal   Collection Time: 08/02/17 12:10 PM  Result Value Ref Range   Glucose-Capillary 119 (H) 65 - 99 mg/dL  Glucose, capillary     Status: Abnormal   Collection Time: 08/02/17  4:52 PM  Result Value Ref Range   Glucose-Capillary 154 (H) 65 - 99 mg/dL  Glucose, capillary     Status: Abnormal   Collection Time: 08/03/17 12:56 AM  Result Value Ref Range   Glucose-Capillary 117 (H) 65 - 99 mg/dL  CBC with Differential/Platelet     Status: Abnormal   Collection Time: 08/03/17  3:42 AM  Result Value Ref Range   WBC 11.9 (H) 4.0 - 10.5 K/uL   RBC 2.71 (L) 3.87 - 5.11 MIL/uL   Hemoglobin 8.4 (L) 12.0 - 15.0 g/dL   HCT 25.6 (L) 36.0 - 46.0 %   MCV 94.5 78.0 - 100.0 fL   MCH 31.0 26.0 - 34.0 pg   MCHC 32.8 30.0 - 36.0 g/dL   RDW 13.4 11.5 - 15.5 %   Platelets 234 150 - 400 K/uL   Neutrophils Relative % 51 %   Neutro Abs 6.0 1.7 - 7.7 K/uL   Lymphocytes Relative 41 %   Lymphs Abs 4.9 (H) 0.7 - 4.0 K/uL   Monocytes Relative 7 %   Monocytes Absolute 0.8 0.1 - 1.0 K/uL   Eosinophils Relative 1 %   Eosinophils Absolute 0.2 0.0 - 0.7 K/uL   Basophils Relative 0 %   Basophils Absolute 0.0 0.0 - 0.1 K/uL    Comment: Performed at [facility], [address], [place], [place]  [address]  Glucose, capillary     Status: Abnormal   Collection Time: 08/03/17  8:25 AM  Result Value Ref Range   Glucose-Capillary 242 (H) 65 - 99 mg/dL  CBC with Differential/Platelet     Status: Abnormal   Collection Time: 08/06/17 11:07 AM  Result Value Ref Range   WBC 9.1 4.0 - 10.5 K/uL   RBC 2.90 (L) 3.87 - 5.11 Mil/uL   Hemoglobin 9.3 (L) 12.0 - 15.0 g/dL   HCT 27.2 (L) 36.0 - 46.0 %   MCV 93.7 78.0 - 100.0 fl   MCHC 34.1 30.0 - 36.0 g/dL   RDW 13.8 11.5 - 15.5 %   Platelets  150.0 - 400.0 K/uL    296.0 Result may be  falsely decreased due to platelet clumping.   Neutrophils Relative % 66.3 43.0 - 77.0 %   Lymphocytes Relative 23.8 12.0 - 46.0 %   Monocytes Relative 7.5 3.0 - 12.0 %   Eosinophils Relative 1.8 0.0 - 5.0 %   Basophils Relative 0.6 0.0 - 3.0 %   Neutro Abs 6.0 1.4 - 7.7 K/uL   Lymphs Abs 2.2 0.7 - 4.0 K/uL   Monocytes Absolute 0.7 0.1 - 1.0 K/uL   Eosinophils Absolute 0.2 0.0 - 0.7 K/uL   Basophils Absolute 0.1 0.0 - 0.1 K/uL    Assessment/Plan:  HTN (hypertension) blood pressure control important in reducing the progression of atherosclerotic disease. On appropriate oral medications.   Morbid obesity Increases her risk of pain and swelling from postphlebitic symptoms.  DVT (deep venous thrombosis) ([place]) The patient has a nonocclusive thrombus in her right popliteal vein by duplex.  This is following a significant trauma as well as immobility from surgery and her injury.  Her size certainly increases her thrombotic risk.  She has started on Eliquis and has tolerated this well with good results so far.  There is no role for thrombectomy.  Given the fact that this is only a popliteal vein DVT, we may consider a shorter course of anticoagulation such as 3 months.  I have discussed the natural history and pathophysiology of DVT.  She voices her understanding and is agreeable with our plan of care.  We discussed consideration for wearing compression stockings and  elevating her legs to avoid postphlebitic symptoms.      Leotis Pain 08/29/2017, 2:14 PM   This note was created with Dragon medical transcription system.  Any errors from dictation are unintentional.

## 2017-08-29 NOTE — Patient Instructions (Signed)
Deep Vein Thrombosis Deep vein thrombosis (DVT) is a condition in which a blood clot forms in a deep vein, such as a lower leg, thigh, or arm vein. A clot is blood that has thickened into a gel or solid. This condition is dangerous. It can lead to serious and even life-threatening complications if the clot travels to the lungs and causes a blockage (pulmonary embolism). It can also damage veins in the leg. This can result in leg pain, swelling, discoloration, and sores (post-thrombotic syndrome). What are the causes? This condition may be caused by:  A slowdown of blood flow.  Damage to a vein.  A condition that makes blood clot more easily.  What increases the risk? The following factors may make you more likely to develop this condition:  Being overweight.  Being elderly, especially over age 60.  Sitting or lying down for more than four hours.  Lack of physical activity (sedentary lifestyle).  Being pregnant, giving birth, or having recently given birth.  Taking medicines that contain estrogen.  Smoking.  A history of any of the following: ? Blood clots or blood clotting disease. ? Peripheral vascular disease. ? Inflammatory bowel disease. ? Cancer. ? Heart disease. ? Genetic conditions that affect how blood clots. ? Neurological diseases that affect the legs (leg paresis). ? Injury. ? Major or lengthy surgery. ? A central line placed inside a large vein.  What are the signs or symptoms? Symptoms of this condition include:  Swelling, pain, or tenderness in an arm or leg.  Warmth, redness, or discoloration in an arm or leg.  If the clot is in your leg, symptoms may be more noticeable or worse when you stand or walk. Some people do not have any symptoms. How is this diagnosed? This condition is diagnosed with:  A medical history.  A physical exam.  Tests, such as: ? Blood tests. These are done to see how your blood clots. ? Imaging tests. These are done to  check for clots. Tests may include:  Ultrasound.  CT scan.  MRI.  X-ray.  Venogram. For this test, X-rays are taken after a dye is injected into a vein.  How is this treated? Treatment for this condition depends on the cause, your risk for bleeding or developing more clots, and any medical conditions you have. Treatment may include:  Taking blood thinners (also called anticoagulants). These medicines may be taken by mouth, injected under the skin, or injected through an IV tube (catheter). These medicines prevent clots from forming.  Injecting medicine that dissolves blood clots into the affected vein (catheter-directed thrombolysis).  Having surgery. Surgery may be done to: ? Remove the clot. ? Place a filter in a large vein to catch blood clots before they reach the lungs.  Some treatments may be continued for up to six months. Follow these instructions at home: If you are taking an oral blood thinner:  Take the medicine exactly as told by your health care provider. Some blood thinners need to be taken at the same time every day. Do not skip a dose.  Ask your health care provider about what foods and drugs interact with the medicine.  Ask about possible side effects. General instructions  Blood thinners can cause easy bruising and difficulty stopping bleeding. Because of this, if you are taking or were given a blood thinner: ? Hold pressure over cuts for longer than usual. ? Tell your dentist and other health care providers that you are taking blood thinners before   having any procedures that can cause bleeding. ? Avoid contact sports.  Take over-the-counter and prescription medicines only as told by your health care provider.  Return to your normal activities as told by your health care provider. Ask your health care provider what activities are safe for you.  Wear compression stockings if recommended by your health care provider.  Keep all follow-up visits as told by  your health care provider. This is important. How is this prevented? To lower your risk of developing this condition again:  For 30 or more minutes every day, do an activity that: ? Involves moving your arms and legs. ? Increases your heart rate.  When traveling for longer than four hours: ? Exercise your arms and legs every hour. ? Drink plenty of water. ? Avoid drinking alcohol.  Avoid sitting or lying for a long time without moving your legs.  Stay a healthy weight.  If you are a woman who is older than age 35, avoid unnecessary use of medicines that contain estrogen.  Do not use any products that contain nicotine or tobacco, such as cigarettes and e-cigarettes. This is especially important if you take estrogen medicines. If you need help quitting, ask your health care provider.  Contact a health care provider if:  You miss a dose of your blood thinner.  You have nausea, vomiting, or diarrhea that lasts for more than one day.  Your menstrual period is heavier than usual.  You have unusual bruising. Get help right away if:  You have new or increased pain, swelling, or redness in an arm or leg.  You have numbness or tingling in an arm or leg.  You have shortness of breath.  You have chest pain.  You have a rapid or irregular heartbeat.  You feel light-headed or dizzy.  You cough up blood.  There is blood in your vomit, stool, or urine.  You have a serious fall or accident, or you hit your head.  You have a severe headache or confusion.  You have a cut that will not stop bleeding. These symptoms may represent a serious problem that is an emergency. Do not wait to see if the symptoms will go away. Get medical help right away. Call your local emergency services (911 in the [place] Do not drive yourself to the hospital. Summary  DVT is a condition in which a blood clot forms in a deep vein, such as a lower leg, thigh, or arm vein.  Symptoms can include swelling,  warmth, pain, and redness in your leg or arm.  Treatment may include taking blood thinners, injecting medicine that dissolves blood clots,wearing compression stockings, or surgery.  If you are prescribed blood thinners, take them exactly as told. This information is not intended to replace advice given to you by your health care provider. Make sure you discuss any questions you have with your health care provider. Document Released: 05/27/2005 Document Revised: 06/29/2016 Document Reviewed: 06/29/2016 Elsevier Interactive Patient Education  2018 [employer]  

## 2017-08-29 NOTE — Assessment & Plan Note (Signed)
blood pressure control important in reducing the progression of atherosclerotic disease. On appropriate oral medications.  

## 2017-08-29 NOTE — Assessment & Plan Note (Signed)
Increases her risk of pain and swelling from postphlebitic symptoms.

## 2017-08-29 NOTE — Assessment & Plan Note (Signed)
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  This is following a significant trauma as well as immobility from surgery and her injury.  Her size certainly increases her thrombotic risk.  She has started on Eliquis and has tolerated this well with good results so far.  There is no role for thrombectomy.  Given the fact that this is only a popliteal vein DVT, we may consider a shorter course of anticoagulation such as 3 months.  I have discussed the natural history and pathophysiology of DVT.  She voices her understanding and is agreeable with our plan of care.  We discussed consideration for wearing compression stockings and elevating her legs to avoid postphlebitic symptoms.

## 2017-09-02 ENCOUNTER — Ambulatory Visit: Payer: BLUE CROSS/BLUE SHIELD | Admitting: Physical Therapy

## 2017-09-02 DIAGNOSIS — M6281 Muscle weakness (generalized): Secondary | ICD-10-CM

## 2017-09-02 DIAGNOSIS — M25512 Pain in left shoulder: Secondary | ICD-10-CM | POA: Diagnosis not present

## 2017-09-02 DIAGNOSIS — M25612 Stiffness of left shoulder, not elsewhere classified: Secondary | ICD-10-CM

## 2017-09-03 NOTE — Therapy (Signed)
[facility] [address] [place], [address] Phone: [phone]   Fax:  [phone]  Physical Therapy Treatment  Patient Details  Name: Danielle Irwin MRN: [id number] Date of Birth: 06-05-1971 Referring Provider: Katha Hamming MD   Encounter Date: 09/02/2017  PT End of Session - 09/02/17 1642    Visit Number  6    Number of Visits  12    Date for PT Re-Evaluation  09/25/17    Authorization Type  6 of 6 FOTO    PT Start Time  1035    PT Stop Time  1116    PT Time Calculation (min)  41 min    Activity Tolerance  Patient tolerated treatment well;Patient limited by pain    Behavior During Therapy  [facility] for tasks assessed/performed       Past Medical History:  Diagnosis Date  . Asthma   . Depression   . Family history of adverse reaction to anesthesia    FATHER WAS SLOW TO WAKE UP  . Headache    OCCASIONAL MIGRAINES  . HNP (herniated nucleus pulposus), lumbar   . Hx of epistaxis   . Insomnia   . OA (osteoarthritis)   . Obesity   . PCOS (polycystic ovarian syndrome)   . Sensitive skin   . Vitamin D deficiency     Past Surgical History:  Procedure Laterality Date  . LUMBAR LAMINECTOMY/DECOMPRESSION MICRODISCECTOMY Right 06/02/2014   Procedure: MICRO LUMBAR DECOMPRESSION L4 - L5 ON THE RIGHT 1 LEVEL;  Surgeon: Johnn Hai, MD;  Location: [facility];  Service: Orthopedics;  Laterality: Right;  . ORIF HUMERUS FRACTURE Left 08/01/2017   Procedure: OPEN REDUCTION INTERNAL FIXATION (ORIF) PROXIMAL HUMERUS FRACTURE;  Surgeon: Shona Needles, MD;  Location: [facility];  Service: Orthopedics;  Laterality: Left;  . WISDOM TOOTH EXTRACTION      There were no vitals filed for this visit.  Subjective Assessment - 09/02/17 1035    Subjective  Patient reports increased lower back pain today on arrival, maybe due to sleeping on back so much. Patient reports she is able to extend elbow with less pain and let's it hang  at her side with sitting and some standing activities. She continues with sling with walking and outside the home.     Pertinent History  Patient reports she fell into a storm door at neighbors after stubbing toe on step 07/30/17. She felt immediate pain and numbness left UE and she could not move her left UE. She was transported to ED and then transported to [facility] in [place] and underwent surgery with ORIF 08/01/17 and released to home 08/03/17.     Limitations  Lifting;Walking;House hold activities out of work; personal care; staying at parent;s due to limitations    Patient Stated Goals  to be able to use her arm again for personal care, return to all ADL's with minimal difficutly    Currently in Pain?  Yes    Pain Score  5     Pain Location  Shoulder    Pain Orientation  Left    Pain Descriptors / Indicators  Aching;Sore;Tightness    Pain Type  Acute pain;Surgical pain    Pain Onset  1 to 4 weeks ago    Pain Frequency  Constant         Objective: Gait: indepedent with sling in place left UE and antalgic pattern, slow cadence using hurry cane for support AAROM left  elbow WNL with discomfort/pain end range extension    Treatment:  Manual therapy: 62min.  STM superficial techniques to left elbow forearm at biceps attachment to improve soft tissue elasticity and improve ROM with less pain in elbow   Therapeutic exercise: patient performed with assistance, verbal and tactile cues of therapist:  PROM/AAROM left shoulder multiple reps 3 sets: up to 95 degrees  flexion, full ROM left elbow flexion/extension instructed patient in self AAROM left shoulder using right UE support   Modalities: Electrical stimulation: 15 FYB:OFBPZWC stim. 10/10 cycle applied (2) electrodes to left shoulder periscapular muscles rhomboids/lower trapezius with patient reclined with left UE supported on pillow; ice pack placed over left shoulder during treatment:  goal muscle re education; pain: no adverse  reactions noted moist heat applied to back during exercises for pain control: patient able to get up and walk without pain at end of session: no adver reactions noted from moist heat   Patient response to treatment: improved AAROM left shoulder to 95+ degrees with less difficulty and pain with repetition and assistance. improved soft tissue elasticity 30% left elbow/biceps with STM.     PT Education - 09/02/17 1111    Education provided  Yes    Education Details  use of heat/ice for pain control, AAROM for left UE using right UE supine    Person(s) Educated  Patient    Methods  Explanation;Demonstration;Verbal cues    Comprehension  Verbal cues required;Returned demonstration;Verbalized understanding          PT Long Term Goals - 08/14/17 1832      PT LONG TERM GOAL #1   Title  Patient will improve FOTO score to 20/100 demonstrating improvement with functional use left UE for daily tasks    Baseline  FOTO 4/100    Status  New    Target Date  09/05/17      PT LONG TERM GOAL #2   Title  Patient will improve FOTO score to 30/100 demonstrating improvement with functional use left UE for daily tasks    Baseline  FOTO 4/100    Status  New    Target Date  09/25/17      PT LONG TERM GOAL #3   Title  patient will be independent with home exercises for flexibility and strength to allow transition to self management once discharged from physical therapy    Baseline  requires assistance for ROM left shoulder, guidance and cuing to perform all exercises    Status  New    Target Date  09/25/17            Plan - 09/02/17 1200    Clinical Impression Statement  Patient with improved PROM to 95+ degrees flexion left shoulder with less pain than previous session. Improved soft tissue elasticity in elbow and pectoral region with STM. She continues with limitations in ROM and strength and limited functional use due to recent surgery.     Rehab Potential  Good    Clinical Impairments  Affecting Rehab Potential  (+) motivated, acute condition (-)obesity, DDD, lumbar     PT Frequency  2x / week    PT Duration  6 weeks    PT Treatment/Interventions  Manual techniques;Neuromuscular re-education;Patient/family education;Cryotherapy;Electrical Stimulation;Moist Heat;Therapeutic exercise;Therapeutic activities;Balance training;Scar mobilization;Passive range of motion    PT Next Visit Plan  pain control, progressive exercises PROM/AAROM shoulder and AROM elbow to hand to improve control/strength left UE    PT Home Exercise Plan  AROM left elbow, wrist,  hand, pendulum exercise for shoulder       Patient will benefit from skilled therapeutic intervention in order to improve the following deficits and impairments:  Pain, Impaired UE functional use, Impaired perceived functional ability, Decreased strength, Decreased range of motion, Decreased endurance, Decreased activity tolerance, Decreased balance, Obesity  Visit Diagnosis: Acute pain of left shoulder  Muscle weakness (generalized)  Stiffness of left shoulder, not elsewhere classified     Problem List Patient Active Problem List   Diagnosis Date Noted  . DVT (deep venous thrombosis) ([place]) 08/29/2017  . Leukocytosis 08/06/2017  . Humerus fracture 08/01/2017  . CPAP (continuous positive airway pressure) dependence 08/01/2017  . Asthma 08/01/2017  . HTN (hypertension) 06/24/2017  . Depression 11/07/2015  . Systolic murmur 16/83/7290  . Morbid obesity ([place]) 10/26/2014  . Insomnia 06/21/2014  . Anxiety 06/21/2014  . Spinal stenosis at L4-L5 level 06/02/2014  . POLYCYSTIC OVARIAN DISEASE 06/19/2010    Jomarie Longs PT 09/03/2017, 4:45 PM  [facility] [address], [place], [address] Phone: [phone]   Fax:  [phone]  Name: Danielle Irwin MRN: [id number] Date of Birth: 11/18/1970

## 2017-09-04 ENCOUNTER — Encounter: Payer: Self-pay | Admitting: Physical Therapy

## 2017-09-04 ENCOUNTER — Ambulatory Visit: Payer: BLUE CROSS/BLUE SHIELD | Admitting: Physical Therapy

## 2017-09-04 DIAGNOSIS — M6281 Muscle weakness (generalized): Secondary | ICD-10-CM

## 2017-09-04 DIAGNOSIS — M25512 Pain in left shoulder: Secondary | ICD-10-CM

## 2017-09-04 DIAGNOSIS — M25612 Stiffness of left shoulder, not elsewhere classified: Secondary | ICD-10-CM

## 2017-09-04 NOTE — Therapy (Signed)
[facility] [address], [place], [address] Phone: [phone]   Fax:  [phone]  Physical Therapy Treatment  Patient Details  Name: Danielle Irwin MRN: [id number] Date of Birth: April 24, 1971 Referring Provider: Katha Hamming MD   Encounter Date: 09/04/2017  PT End of Session - 09/04/17 1258    Visit Number  7    Number of Visits  12    Date for PT Re-Evaluation  09/25/17    Authorization Type  7 of 6 FOTO    PT Start Time  1251    PT Stop Time  1346    PT Time Calculation (min)  55 min    Activity Tolerance  Patient tolerated treatment well;Patient limited by pain    Behavior During Therapy  [facility] for tasks assessed/performed       Past Medical History:  Diagnosis Date  . Asthma   . Depression   . Family history of adverse reaction to anesthesia    FATHER WAS SLOW TO WAKE UP  . Headache    OCCASIONAL MIGRAINES  . HNP (herniated nucleus pulposus), lumbar   . Hx of epistaxis   . Insomnia   . OA (osteoarthritis)   . Obesity   . PCOS (polycystic ovarian syndrome)   . Sensitive skin   . Vitamin D deficiency     Past Surgical History:  Procedure Laterality Date  . LUMBAR LAMINECTOMY/DECOMPRESSION MICRODISCECTOMY Right 06/02/2014   Procedure: MICRO LUMBAR DECOMPRESSION L4 - L5 ON THE RIGHT 1 LEVEL;  Surgeon: Johnn Hai, MD;  Location: [facility];  Service: Orthopedics;  Laterality: Right;  . ORIF HUMERUS FRACTURE Left 08/01/2017   Procedure: OPEN REDUCTION INTERNAL FIXATION (ORIF) PROXIMAL HUMERUS FRACTURE;  Surgeon: Shona Needles, MD;  Location: [facility];  Service: Orthopedics;  Laterality: Left;  . WISDOM TOOTH EXTRACTION      There were no vitals filed for this visit.  Subjective Assessment - 09/04/17 1254    Subjective  Patient reports back soreness improved from yesterday with moist heat helping and she is lying on her side in bed which also seems to help. She is exercising at home with  scapular retraction, AAROM for shoulder elevation     Pertinent History  Patient reports she fell into a storm door at neighbors after stubbing toe on step 07/30/17. She felt immediate pain and numbness left UE and she could not move her left UE. She was transported to ED and then transported to [facility] in [place] and underwent surgery with ORIF 08/01/17 and released to home 08/03/17.     Limitations  Lifting;Walking;House hold activities out of work; personal care; staying at parent;s due to limitations    Patient Stated Goals  to be able to use her arm again for personal care, return to all ADL's with minimal difficutly    Currently in Pain?  Yes    Pain Score  2     Pain Location  Shoulder    Pain Orientation  Left    Pain Descriptors / Indicators  Aching;Sore;Tightness    Pain Type  Acute pain;Surgical pain 08/01/2017    Pain Onset  1 to 4 weeks ago    Pain Frequency  Constant           Objective: Gait: indepedent with sling in place left UE slow cadence using hurry cane for support AAROM left elbow WNL with discomfort/pain end range extension    Treatment:  Manual therapy:  57mn.  STM superficial techniques to left elbow forearm at biceps attachment with patient supine lying to improve soft tissue elasticity and improve ROM with less pain in elbow; STM to cervical spine and upper trapezius muscles left side with patient supine   Therapeutic exercise: patient performed with assistance, verbal and tactile cues of therapist:  PROM/AAROM left shoulder multiple reps 3 sets: up to 95+ degrees  flexion; pain limits further motion, full ROM left elbow flexion/extension, forearm supination/pronation and all wrist motions Re assessed patient self AAROM left shoulder using right UE support   Modalities: Electrical stimulation: 15 mZLD:JTTSVXBstim. 10/10 cycle applied (2) electrodes to left shoulder periscapular muscles rhomboids/lower trapezius with patient reclined with left UE supported  on pillow; ice pack placed over left shoulder during treatment:  goal muscle re education; pain: no adverse reactions noted moist heat applied to back during exercises for pain control: patient able to get up and walk without pain at end of session: no adver reactions noted from moist heat   Patient response to treatment: patient demonstrated improved technique with exercises with maximal assistance and repetition. Improved soft tissue elasticity left biceps, cervical spine, upper trapezius by 50% following STM.     PT Education - 09/04/17 1347    Education provided  Yes    Education Details  exercise instruction for AAROM left shoulder     Person(s) Educated  Patient    Methods  Explanation;Demonstration;Verbal cues    Comprehension  Verbalized understanding;Returned demonstration;Verbal cues required          PT Long Term Goals - 09/04/17 1403      PT LONG TERM GOAL #1   Title  Patient will improve FOTO score to 20/100 demonstrating improvement with functional use left UE for daily tasks    Baseline  FOTO 4/100; 09/04/17 4/100 no change due to severity of injury/surgery and continued restrictions on use/movement    Status  Not Met      PT LONG TERM GOAL #2   Title  Patient will improve FOTO score to 30/100 demonstrating improvement with functional use left UE for daily tasks    Baseline  FOTO 4/100    Status  On-going    Target Date  09/25/17      PT LONG TERM GOAL #3   Title  patient will be independent with home exercises for flexibility and strength to allow transition to self management once discharged from physical therapy    Baseline  requires assistance for ROM left shoulder, guidance and cuing to perform all exercises    Status  On-going    Target Date  09/25/17            Plan - 09/04/17 1400    Clinical Impression Statement  Patient able to perform exercises with less difficulty and pain today. She contines with limitations of ROM and strength due to recent  injury/surgery with restrictions for exercise and use of left UE at home. Her FOTO score of 4/100 did not change from initial assessment due to severity of injury /surgery and restrictions for movement.     Rehab Potential  Good    Clinical Impairments Affecting Rehab Potential  (+) motivated, acute condition (-)obesity, DDD, lumbar     PT Frequency  2x / week    PT Duration  6 weeks    PT Treatment/Interventions  Manual techniques;Neuromuscular re-education;Patient/family education;Cryotherapy;Electrical Stimulation;Moist Heat;Therapeutic exercise;Therapeutic activities;Balance training;Scar mobilization;Passive range of motion    PT Next Visit Plan  pain control,  progressive exercises PROM/AAROM shoulder and AROM elbow to hand to improve control/strength left UE    PT Home Exercise Plan  AROM left elbow, wrist, hand, pendulum exercise for shoulder       Patient will benefit from skilled therapeutic intervention in order to improve the following deficits and impairments:  Pain, Impaired UE functional use, Impaired perceived functional ability, Decreased strength, Decreased range of motion, Decreased endurance, Decreased activity tolerance, Decreased balance, Obesity  Visit Diagnosis: Acute pain of left shoulder  Muscle weakness (generalized)  Stiffness of left shoulder, not elsewhere classified     Problem List Patient Active Problem List   Diagnosis Date Noted  . DVT (deep venous thrombosis) ([place]) 08/29/2017  . Leukocytosis 08/06/2017  . Humerus fracture 08/01/2017  . CPAP (continuous positive airway pressure) dependence 08/01/2017  . Asthma 08/01/2017  . HTN (hypertension) 06/24/2017  . Depression 11/07/2015  . Systolic murmur 72/55/0016  . Morbid obesity ([place]) 10/26/2014  . Insomnia 06/21/2014  . Anxiety 06/21/2014  . Spinal stenosis at L4-L5 level 06/02/2014  . POLYCYSTIC OVARIAN DISEASE 06/19/2010    Jomarie Longs PT 09/05/2017, 10:57 AM  [facility] [address] [place], [address] Phone: [phone]   Fax:  [phone]  Name: Danielle Irwin MRN: [id number] Date of Birth: [birth year]

## 2017-09-09 ENCOUNTER — Encounter: Payer: Self-pay | Admitting: Physical Therapy

## 2017-09-09 ENCOUNTER — Ambulatory Visit: Payer: BLUE CROSS/BLUE SHIELD | Attending: Student | Admitting: Physical Therapy

## 2017-09-09 DIAGNOSIS — M6281 Muscle weakness (generalized): Secondary | ICD-10-CM | POA: Diagnosis present

## 2017-09-09 DIAGNOSIS — M25612 Stiffness of left shoulder, not elsewhere classified: Secondary | ICD-10-CM | POA: Insufficient documentation

## 2017-09-09 DIAGNOSIS — M25512 Pain in left shoulder: Secondary | ICD-10-CM | POA: Diagnosis present

## 2017-09-09 NOTE — Therapy (Signed)
[facility] [address], [place], [address] Phone: [phone]   Fax:  [phone]  Physical Therapy Treatment  Patient Details  Name: Danielle Irwin MRN: [id number] Date of Birth: 05/17/1971 Referring Provider: Katha Hamming MD   Encounter Date: 09/09/2017  PT End of Session - 09/09/17 1215    Visit Number  8    Number of Visits  12    Date for PT Re-Evaluation  09/25/17    Authorization Type  1 of 6 FOTO    PT Start Time  1122    PT Stop Time  1210    PT Time Calculation (min)  48 min    Activity Tolerance  Patient tolerated treatment well;Patient limited by pain    Behavior During Therapy  [facility] for tasks assessed/performed       Past Medical History:  Diagnosis Date  . Asthma   . Depression   . Family history of adverse reaction to anesthesia    FATHER WAS SLOW TO WAKE UP  . Headache    OCCASIONAL MIGRAINES  . HNP (herniated nucleus pulposus), lumbar   . Hx of epistaxis   . Insomnia   . OA (osteoarthritis)   . Obesity   . PCOS (polycystic ovarian syndrome)   . Sensitive skin   . Vitamin D deficiency     Past Surgical History:  Procedure Laterality Date  . LUMBAR LAMINECTOMY/DECOMPRESSION MICRODISCECTOMY Right 06/02/2014   Procedure: MICRO LUMBAR DECOMPRESSION L4 - L5 ON THE RIGHT 1 LEVEL;  Surgeon: Johnn Hai, MD;  [facility]: [facility];  Service: Orthopedics;  Laterality: Right;  . ORIF HUMERUS FRACTURE Left 08/01/2017   Procedure: OPEN REDUCTION INTERNAL FIXATION (ORIF) PROXIMAL HUMERUS FRACTURE;  Surgeon: Shona Needles, MD;  Location: [facility];  Service: Orthopedics;  Laterality: Left;  . WISDOM TOOTH EXTRACTION      There were no vitals filed for this visit.  Subjective Assessment - 09/09/17 1123    Subjective  Patient reports she is sore in left upper arm and elbow/biceps region. she is exercising as instructed.     Pertinent History  Patient reports she fell into a storm door at  neighbors after stubbing toe on step 07/30/17. She felt immediate pain and numbness left UE and she could not move her left UE. She was transported to ED and then transported to [facility] in [place] and underwent surgery with ORIF 08/01/17 and released to home 08/03/17.     Limitations  Lifting;Walking;House hold activities out of work; personal care; staying at parent;s due to limitations    Patient Stated Goals  to be able to use her arm again for personal care, return to all ADL's with minimal difficutly    Currently in Pain?  Yes    Pain Score  4     Pain Location  Shoulder    Pain Orientation  Left    Pain Type  Acute pain;Surgical pain 08/01/2017    Pain Onset  1 to 4 weeks ago    Pain Frequency  Constant            Objective: Gait: indepedent with sling in place left UE slow cadence using hurry cane for support AAROM/PROM left shoulder 0-90 with pain limiting further motion   Treatment:  Manual therapy: 10 min.  STM superficial techniques to left elbow forearm at biceps attachment with patient supine lying to improve soft tissue elasticity and improve ROM with less pain in  elbow; STM to cervical spine and upper trapezius muscles left side with patient supine   Therapeutic exercise: patient performed with assistance, verbal and tactile cues of therapist:  PROM/AAROM left shoulder multiple reps 3 sets: up to 90+ degrees  flexion; pain limits further motion, full ROM left elbow flexion/extension, forearm supination/pronation and all wrist motions instructed patient in AAROM on table or counter using clothe for support and to assist motion     Modalities: Electrical stimulation: 15 VVK:PQAESLP stim. 10/10 cycle applied (2) electrodes to left shoulder periscapular muscles rhomboids/lower trapezius with patient reclined with left UE supported on pillow; ice pack placed over left shoulder during treatment:  goal muscle re education; pain: no adverse reactions noted moist heat applied to  back during exercises for pain control: patient able to get up and walk without pain at end of session: no adver reactions noted from moist heat   Patient response to treatment: improve soft tissue elasticity left UE and cervical spine and upper trapezius muscle by 50% following STM. patient limited by pain for ROM exercises.     PT Education - 09/09/17 1222    Education provided  Yes    Education Details  HEP AAROM left UE with hand on table or counter with clothe to assist movement    Person(s) Educated  Patient    Methods  Explanation;Demonstration    Comprehension  Verbalized understanding          PT Long Term Goals - 09/04/17 1403      PT LONG TERM GOAL #1   Title  Patient will improve FOTO score to 20/100 demonstrating improvement with functional use left UE for daily tasks    Baseline  FOTO 4/100; 09/04/17 4/100 no change due to severity of injury/surgery and continued restrictions on use/movement    Status  Not Met      PT LONG TERM GOAL #2   Title  Patient will improve FOTO score to 30/100 demonstrating improvement with functional use left UE for daily tasks    Baseline  FOTO 4/100    Status  On-going    Target Date  09/25/17      PT LONG TERM GOAL #3   Title  patient will be independent with home exercises for flexibility and strength to allow transition to self management once discharged from physical therapy    Baseline  requires assistance for ROM left shoulder, guidance and cuing to perform all exercises    Status  On-going    Target Date  09/25/17            Plan - 09/09/17 1219    Clinical Impression Statement  Pain limited patient's progress with ROM left shoulder forward elevation and ER. She is improving with elbow ROM and is able to perform AAROM at home with right UE assistance. She will continue to require physical therapy intervention for pain control and ROM in order to improve functional use of left UE.     Rehab Potential  Good    Clinical  Impairments Affecting Rehab Potential  (+) motivated, acute condition (-)obesity, DDD, lumbar     PT Frequency  2x / week    PT Duration  6 weeks    PT Treatment/Interventions  Manual techniques;Neuromuscular re-education;Patient/family education;Cryotherapy;Electrical Stimulation;Moist Heat;Therapeutic exercise;Therapeutic activities;Balance training;Scar mobilization;Passive range of motion    PT Next Visit Plan  pain control, progressive exercises PROM/AAROM shoulder and AROM elbow to hand to improve control/strength left UE    PT Home Exercise Plan  AROM left elbow, wrist, hand, pendulum exercise for shoulder       Patient will benefit from skilled therapeutic intervention in order to improve the following deficits and impairments:  Pain, Impaired UE functional use, Impaired perceived functional ability, Decreased strength, Decreased range of motion, Decreased endurance, Decreased activity tolerance, Decreased balance, Obesity  Visit Diagnosis: Acute pain of left shoulder  Muscle weakness (generalized)  Stiffness of left shoulder, not elsewhere classified     Problem List Patient Active Problem List   Diagnosis Date Noted  . DVT (deep venous thrombosis) ([place]) 08/29/2017  . Leukocytosis 08/06/2017  . Humerus fracture 08/01/2017  . CPAP (continuous positive airway pressure) dependence 08/01/2017  . Asthma 08/01/2017  . HTN (hypertension) 06/24/2017  . Depression 11/07/2015  . Systolic murmur 81/19/1478  . Morbid obesity ([place]) 10/26/2014  . Insomnia 06/21/2014  . Anxiety 06/21/2014  . Spinal stenosis at L4-L5 level 06/02/2014  . POLYCYSTIC OVARIAN DISEASE 06/19/2010    Jomarie Longs PT 09/09/2017, 12:23 PM  [facility] [address] [place], [address] Phone: [phone]   Fax:  [phone]  Name: Danielle Irwin MRN: [id number] Date of Birth: 1971/06/08

## 2017-09-11 ENCOUNTER — Encounter: Payer: Self-pay | Admitting: Physical Therapy

## 2017-09-11 ENCOUNTER — Ambulatory Visit: Payer: BLUE CROSS/BLUE SHIELD | Admitting: Physical Therapy

## 2017-09-11 DIAGNOSIS — M25512 Pain in left shoulder: Secondary | ICD-10-CM

## 2017-09-11 DIAGNOSIS — M25612 Stiffness of left shoulder, not elsewhere classified: Secondary | ICD-10-CM

## 2017-09-11 DIAGNOSIS — M6281 Muscle weakness (generalized): Secondary | ICD-10-CM

## 2017-09-11 NOTE — Therapy (Signed)
[facility] [address], [place], [address] Phone: [phone]   Fax:  [phone]  Physical Therapy Treatment  Patient Details  Name: Danielle Irwin MRN: [id number] Date of Birth: September 06, 1970 Referring Provider: Katha Hamming MD   Encounter Date: 09/11/2017  PT End of Session - 09/11/17 1310    Visit Number  9    Number of Visits  12    Date for PT Re-Evaluation  09/25/17    Authorization Type  2 of 6 FOTO    PT Start Time  1302    PT Stop Time  1348    PT Time Calculation (min)  46 min    Activity Tolerance  Patient tolerated treatment well;Patient limited by pain    Behavior During Therapy  [facility] for tasks assessed/performed       Past Medical History:  Diagnosis Date  . Asthma   . Depression   . Family history of adverse reaction to anesthesia    FATHER WAS SLOW TO WAKE UP  . Headache    OCCASIONAL MIGRAINES  . HNP (herniated nucleus pulposus), lumbar   . Hx of epistaxis   . Insomnia   . OA (osteoarthritis)   . Obesity   . PCOS (polycystic ovarian syndrome)   . Sensitive skin   . Vitamin D deficiency     Past Surgical History:  Procedure Laterality Date  . LUMBAR LAMINECTOMY/DECOMPRESSION MICRODISCECTOMY Right 06/02/2014   Procedure: MICRO LUMBAR DECOMPRESSION L4 - L5 ON THE RIGHT 1 LEVEL;  Surgeon: Johnn Hai, MD;  [facility];  Service: Orthopedics;  Laterality: Right;  . ORIF HUMERUS FRACTURE Left 08/01/2017   Procedure: OPEN REDUCTION INTERNAL FIXATION (ORIF) PROXIMAL HUMERUS FRACTURE;  Surgeon: Shona Needles, MD;  Location: [facility];  Service: Orthopedics;  Laterality: Left;  . WISDOM TOOTH EXTRACTION      There were no vitals filed for this visit.  Subjective Assessment - 09/11/17 1308    Subjective  Patient reports she is still sore left shoulder and left elbow; improving overall    Pertinent History  Patient reports she fell into a storm door at neighbors after stubbing  toe on step 07/30/17. She felt immediate pain and numbness left UE and she could not move her left UE. She was transported to ED and then transported to [facility] in [place] and underwent surgery with ORIF 08/01/17 and released to home 08/03/17.     Limitations  Lifting;Walking;House hold activities out of work; personal care; staying at parent;s due to limitations    Patient Stated Goals  to be able to use her arm again for personal care, return to all ADL's with minimal difficutly    Currently in Pain?  Yes    Pain Score  4     Pain Location  Shoulder    Pain Orientation  Left    Pain Descriptors / Indicators  Aching;Sore    Pain Type  Acute pain;Surgical pain 08/01/2017    Pain Onset  1 to 4 weeks ago    Pain Frequency  Constant         Objective:   Treatment:  Manual therapy: 10 min.  STM superficial techniques to improve soft tissue elasticity; STM to cervical spine patient supine    Therapeutic exercise: patient performed with assistance, verbal and tactile cues of therapist:  PROM/AAROM left shoulder multiple reps 3 sets: up to 90+ degrees  flexion; pain limits further motion, full ROM  left elbow flexion/extension, forearm supination/pronation and all wrist motions re instructed patient in AAROM on table or counter using clothe for support and to assist motion: patient performed with asssitance and VC of therapist for correct technique, keeping shoulder in good alignment    Modalities: Electrical stimulation: 15 XVQ:[id number] stim. 10/10 cycle applied (2) electrodes to left shoulder periscapular muscles rhomboids/lower trapezius and electrical stimulation for pain applied (2) electrodes to left shoulder superior nad lateral aspects with patient reclined with left UE supported on pillow; ice pack placed over left shoulder during treatment:  goal muscle re education; pain: no adverse reactions noted   Patient response to treatment: improved PROM left shoulder with repetition; patient  with increased pain with AAROM. patient limited by pain      PT Education - 09/11/17 1310    Education provided  Yes    Education Details  re assessed AAROM for home    Person(s) Educated  Patient    Methods  Explanation;Demonstration;Verbal cues    Comprehension  Verbalized understanding;Returned demonstration;Verbal cues required          PT Long Term Goals - 09/04/17 1403      PT LONG TERM GOAL #1   Title  Patient will improve FOTO score to 20/100 demonstrating improvement with functional use left UE for daily tasks    Baseline  FOTO 4/100; 09/04/17 4/100 no change due to severity of injury/surgery and continued restrictions on use/movement    Status  Not Met      PT LONG TERM GOAL #2   Title  Patient will improve FOTO score to 30/100 demonstrating improvement with functional use left UE for daily tasks    Baseline  FOTO 4/100    Status  On-going    Target Date  09/25/17      PT LONG TERM GOAL #3   Title  patient will be independent with home exercises for flexibility and strength to allow transition to self management once discharged from physical therapy    Baseline  requires assistance for ROM left shoulder, guidance and cuing to perform all exercises    Status  On-going    Target Date  09/25/17            Plan - 09/11/17 1332    Clinical Impression Statement  Patient is progressing with PROM to 95+ and continues with increased pain with AAROM forward elevation and rotation to neutral.     Rehab Potential  Good    Clinical Impairments Affecting Rehab Potential  (+) motivated, acute condition (-)obesity, DDD, lumbar     PT Frequency  2x / week    PT Duration  6 weeks    PT Treatment/Interventions  Manual techniques;Neuromuscular re-education;Patient/family education;Cryotherapy;Electrical Stimulation;Moist Heat;Therapeutic exercise;Therapeutic activities;Balance training;Scar mobilization;Passive range of motion    PT Next Visit Plan  pain control, progressive  exercises PROM/AAROM shoulder and AROM elbow to hand to improve control/strength left UE    PT Home Exercise Plan  AROM left elbow, wrist, hand, pendulum exercise for shoulder       Patient will benefit from skilled therapeutic intervention in order to improve the following deficits and impairments:  Pain, Impaired UE functional use, Impaired perceived functional ability, Decreased strength, Decreased range of motion, Decreased endurance, Decreased activity tolerance, Decreased balance, Obesity  Visit Diagnosis: Acute pain of left shoulder  Muscle weakness (generalized)  Stiffness of left shoulder, not elsewhere classified     Problem List Patient Active Problem List   Diagnosis Date Noted  .  DVT (deep venous thrombosis) ([place]) 08/29/2017  . Leukocytosis 08/06/2017  . Humerus fracture 08/01/2017  . CPAP (continuous positive airway pressure) dependence 08/01/2017  . Asthma 08/01/2017  . HTN (hypertension) 06/24/2017  . Depression 11/07/2015  . Systolic murmur 23/06/7207  . Morbid obesity ([place]) 10/26/2014  . Insomnia 06/21/2014  . Anxiety 06/21/2014  . Spinal stenosis at L4-L5 level 06/02/2014  . POLYCYSTIC OVARIAN DISEASE 06/19/2010    Jomarie Longs PT 09/12/2017, 9:09 AM  [facility] [address], [place], [address] Phone: [phone]   Fax:  [phone]  Name: Danielle Irwin MRN: [id number] Date of Birth: 1970-08-17

## 2017-09-15 ENCOUNTER — Ambulatory Visit: Payer: BLUE CROSS/BLUE SHIELD | Admitting: Physical Therapy

## 2017-09-15 ENCOUNTER — Encounter: Payer: Self-pay | Admitting: Physical Therapy

## 2017-09-15 DIAGNOSIS — M25612 Stiffness of left shoulder, not elsewhere classified: Secondary | ICD-10-CM

## 2017-09-15 DIAGNOSIS — M25512 Pain in left shoulder: Secondary | ICD-10-CM

## 2017-09-15 DIAGNOSIS — M6281 Muscle weakness (generalized): Secondary | ICD-10-CM

## 2017-09-15 NOTE — Therapy (Signed)
[facility] [address] [place], [address] Phone: [phone]   Fax:  [phone]  Physical Therapy Treatment  Patient Details  Name: Danielle Irwin MRN: [id number] Date of Birth: July 13, 1970 Referring Provider: Katha Hamming MD   Encounter Date: 09/15/2017  PT End of Session - 09/15/17 1128    Visit Number  10    Number of Visits  12    Date for PT Re-Evaluation  09/25/17    Authorization Type  3 of 6 FOTO    PT Start Time  1046    PT Stop Time  1128    PT Time Calculation (min)  42 min    Activity Tolerance  Patient tolerated treatment well;Patient limited by pain    Behavior During Therapy  [facility] for tasks assessed/performed       Past Medical History:  Diagnosis Date  . Asthma   . Depression   . Family history of adverse reaction to anesthesia    FATHER WAS SLOW TO WAKE UP  . Headache    OCCASIONAL MIGRAINES  . HNP (herniated nucleus pulposus), lumbar   . Hx of epistaxis   . Insomnia   . OA (osteoarthritis)   . Obesity   . PCOS (polycystic ovarian syndrome)   . Sensitive skin   . Vitamin D deficiency     Past Surgical History:  Procedure Laterality Date  . LUMBAR LAMINECTOMY/DECOMPRESSION MICRODISCECTOMY Right 06/02/2014   Procedure: MICRO LUMBAR DECOMPRESSION L4 - L5 ON THE RIGHT 1 LEVEL;  Surgeon: Johnn Hai, MD;  [facility];  Service: Orthopedics;  Laterality: Right;  . ORIF HUMERUS FRACTURE Left 08/01/2017   Procedure: OPEN REDUCTION INTERNAL FIXATION (ORIF) PROXIMAL HUMERUS FRACTURE;  Surgeon: Shona Needles, MD;  Location: [facility];  Service: Orthopedics;  Laterality: Left;  . WISDOM TOOTH EXTRACTION      There were no vitals filed for this visit.  Subjective Assessment - 09/15/17 1055    Subjective  Patient reports she had a "stumble" over the weekend and bumped her arm with it in the sling. She reports feeling sharp pain that was relieved with ice and rest. She is to see  MD tomorrow. She also reports she is "sore all over" today    Pertinent History  Patient reports she fell into a storm door at neighbors after stubbing toe on step 07/30/17. She felt immediate pain and numbness left UE and she could not move her left UE. She was transported to ED and then transported to [facility] in [place] and underwent surgery with ORIF 08/01/17 and released to home 08/03/17.     Limitations  Lifting;Walking;House hold activities out of work; personal care; staying at parent;s due to limitations    Patient Stated Goals  to be able to use her arm again for personal care, return to all ADL's with minimal difficutly    Currently in Pain?  Yes    Pain Score  5     Pain Location  Shoulder    Pain Orientation  Left    Pain Descriptors / Indicators  Sore;Aching    Pain Type  Acute pain;Surgical pain 08/01/2017    Pain Onset  1 to 4 weeks ago    Pain Frequency  Constant          Objective: Observation: patient arrived with sling in place left UE, slow cadence with antalgic gait Palpation: left UE general tenderness to palpation along upper arm more  lateral to incision, tender anterior aspect left shoulder and mild spasms left biceps    Treatment:  Manual therapy: 13 min.  STM superficial techniques to improve soft tissue elasticity; STM to cervical spine patient supine and left UE biceps    Therapeutic exercise: patient performed with assistance, verbal and tactile cues of therapist:  PROM/AAROM left shoulder multiple reps 2 sets: up to 90+ degrees  flexion; pain limits further motion, full ROM left elbow flexion/extension, forearm supination/pronation and all wrist motions    Modalities: Electrical stimulation: 20 SJG:[id number] stim. 10/10 cycle applied (2) electrodes to left shoulder periscapular muscles rhomboids/lower trapezius with patient reclined with left UE supported on pillow; ice pack placed over left shoulder at end of session:  goal muscle re education; pain: no  adverse reactions noted   Patient response to treatment: patient demonstrated improved ROM with exercises with minimal VC for correct alignment and repetition. Patient with decreased pain from 5/10 to  3/10.     PT Education - 09/15/17 1058    Education provided  Yes    Education Details  instruction for [employer]) Educated  Patient    Methods  Explanation    Comprehension  Verbalized understanding          PT Long Term Goals - 09/04/17 1403      PT LONG TERM GOAL #1   Title  Patient will improve FOTO score to 20/100 demonstrating improvement with functional use left UE for daily tasks    Baseline  FOTO 4/100; 09/04/17 4/100 no change due to severity of injury/surgery and continued restrictions on use/movement    Status  Not Met      PT LONG TERM GOAL #2   Title  Patient will improve FOTO score to 30/100 demonstrating improvement with functional use left UE for daily tasks    Baseline  FOTO 4/100    Status  On-going    Target Date  09/25/17      PT LONG TERM GOAL #3   Title  patient will be independent with home exercises for flexibility and strength to allow transition to self management once discharged from physical therapy    Baseline  requires assistance for ROM left shoulder, guidance and cuing to perform all exercises    Status  On-going    Target Date  09/25/17            Plan - 09/15/17 1123    Clinical Impression Statement  Patient is progressing with PROM left shoulder. Session was limited to pain control and minimal exercises to left UE due to patient reporting stumbling over weekend. She is to have re assessment by MD tomorrow and will discuss progress and progression of exercises and use of left UE for activities at home.     Rehab Potential  Good    Clinical Impairments Affecting Rehab Potential  (+) motivated, acute condition (-)obesity, DDD, lumbar     PT Frequency  2x / week    PT Duration  6 weeks    PT Treatment/Interventions  Manual  techniques;Neuromuscular re-education;Patient/family education;Cryotherapy;Electrical Stimulation;Moist Heat;Therapeutic exercise;Therapeutic activities;Balance training;Scar mobilization;Passive range of motion    PT Next Visit Plan  pain control, progressive exercises PROM/AAROM shoulder and AROM elbow to hand to improve control/strength left UE    PT Home Exercise Plan  AROM left elbow, wrist, hand, pendulum exercise for shoulder       Patient will benefit from skilled therapeutic intervention in order to improve the following deficits  and impairments:  Pain, Impaired UE functional use, Impaired perceived functional ability, Decreased strength, Decreased range of motion, Decreased endurance, Decreased activity tolerance, Decreased balance, Obesity  Visit Diagnosis: Acute pain of left shoulder  Muscle weakness (generalized)  Stiffness of left shoulder, not elsewhere classified     Problem List Patient Active Problem List   Diagnosis Date Noted  . DVT (deep venous thrombosis) ([place]) 08/29/2017  . Leukocytosis 08/06/2017  . Humerus fracture 08/01/2017  . CPAP (continuous positive airway pressure) dependence 08/01/2017  . Asthma 08/01/2017  . HTN (hypertension) 06/24/2017  . Depression 11/07/2015  . Systolic murmur 89/84/2103  . Morbid obesity ([place]) 10/26/2014  . Insomnia 06/21/2014  . Anxiety 06/21/2014  . Spinal stenosis at L4-L5 level 06/02/2014  . POLYCYSTIC OVARIAN DISEASE 06/19/2010    Jomarie Longs PT 09/16/2017, 7:22 AM  [facility] [address] [place], [address] Phone: [phone]   Fax:  [phone]  Name: Danielle Irwin MRN: [id number] Date of Birth: 07-07-1970

## 2017-09-18 ENCOUNTER — Telehealth (INDEPENDENT_AMBULATORY_CARE_PROVIDER_SITE_OTHER): Payer: Self-pay | Admitting: Vascular Surgery

## 2017-09-18 ENCOUNTER — Ambulatory Visit: Payer: BLUE CROSS/BLUE SHIELD | Admitting: Physical Therapy

## 2017-09-18 ENCOUNTER — Encounter: Payer: Self-pay | Admitting: Physical Therapy

## 2017-09-18 ENCOUNTER — Other Ambulatory Visit (INDEPENDENT_AMBULATORY_CARE_PROVIDER_SITE_OTHER): Payer: Self-pay

## 2017-09-18 DIAGNOSIS — M25612 Stiffness of left shoulder, not elsewhere classified: Secondary | ICD-10-CM

## 2017-09-18 DIAGNOSIS — M6281 Muscle weakness (generalized): Secondary | ICD-10-CM

## 2017-09-18 DIAGNOSIS — M25512 Pain in left shoulder: Secondary | ICD-10-CM | POA: Diagnosis not present

## 2017-09-18 MED ORDER — ELIQUIS 5 MG VTE STARTER PACK: 5.0000 mg | ORAL_TABLET | Freq: Two times a day (BID) | ORAL | 7 refills | Status: DC

## 2017-09-18 NOTE — Telephone Encounter (Signed)
Patient called because she is about to run out of her Eliquis medication. Has 4 days  left. [place]

## 2017-09-18 NOTE — Telephone Encounter (Signed)
Medication refill has been sent through the system

## 2017-09-18 NOTE — Therapy (Signed)
[facility] [address] [place], [address] Phone: [phone]   Fax:  [phone]  Physical Therapy Treatment  Patient Details  Name: Danielle Irwin MRN: [id number] Date of Birth: 29-Nov-1970 Referring Provider: Katha Hamming MD   Encounter Date: 09/18/2017  PT End of Session - 09/18/17 1106    Visit Number  11    Number of Visits  12    Date for PT Re-Evaluation  09/25/17    Authorization Type  4 of 6 FOTO    PT Start Time  1030    PT Stop Time  1115    PT Time Calculation (min)  45 min    Activity Tolerance  Patient tolerated treatment well;Patient limited by pain    Behavior During Therapy  [facility] for tasks assessed/performed       Past Medical History:  Diagnosis Date  . Asthma   . Depression   . Family history of adverse reaction to anesthesia    FATHER WAS SLOW TO WAKE UP  . Headache    OCCASIONAL MIGRAINES  . HNP (herniated nucleus pulposus), lumbar   . Hx of epistaxis   . Insomnia   . OA (osteoarthritis)   . Obesity   . PCOS (polycystic ovarian syndrome)   . Sensitive skin   . Vitamin D deficiency     Past Surgical History:  Procedure Laterality Date  . LUMBAR LAMINECTOMY/DECOMPRESSION MICRODISCECTOMY Right 06/02/2014   Procedure: MICRO LUMBAR DECOMPRESSION L4 - L5 ON THE RIGHT 1 LEVEL;  Surgeon: Johnn Hai, MD;  Location: [facility];  Service: Orthopedics;  Laterality: Right;  . ORIF HUMERUS FRACTURE Left 08/01/2017   Procedure: OPEN REDUCTION INTERNAL FIXATION (ORIF) PROXIMAL HUMERUS FRACTURE;  Surgeon: Shona Needles, MD;  Location: [place] [facility];  Service: Orthopedics;  Laterality: Left;  . WISDOM TOOTH EXTRACTION      There were no vitals filed for this visit.  Subjective Assessment - 09/18/17 1034    Subjective  Patient was seen by MD and got a good report and can do active exercise now. She had X rays and said they were good    Pertinent History  Patient reports she fell into a storm  door at neighbors after stubbing toe on step 07/30/17. She felt immediate pain and numbness left UE and she could not move her left UE. She was transported to ED and then transported to [facility] in [place] and underwent surgery with ORIF 08/01/17 and released to home 08/03/17.     Limitations  Lifting;Walking;House hold activities out of work; personal care; staying at parent;s due to limitations    Patient Stated Goals  to be able to use her arm again for personal care, return to all ADL's with minimal difficutly    Currently in Pain?  Yes    Pain Score  6     Pain Location  Shoulder    Pain Orientation  Left    Pain Descriptors / Indicators  Aching    Pain Type  Acute pain;Surgical pain 08/01/2017    Pain Onset  1 to 4 weeks ago    Pain Frequency  Constant         Objective:   Treatment:  Manual therapy: 10 min.  STM superficial techniques to improve soft tissue elasticity; STM to cervical spine patient supine and left UE biceps    Therapeutic exercise: patient performed with assistance, verbal and tactile cues of therapist:  PROM/AAROM left shoulder  multiple reps 2 sets: up to 90+ degrees  flexion; pain limits further motion, full ROM left elbow flexion/extension, forearm supination/pronation and all wrist motions AAROM left UE with ball on treatment table forward and back and side to side x 15 reps each  Modalities: Electrical stimulation: 15 KFE:[id number] stim. 10/10 cycle applied (2) electrodes to left shoulder periscapular muscles rhomboids/lower trapezius with patient reclined with left UE supported on pillow; high volt estim (2) electrodes applied to left shoulder upper trapezius and anterior aspect, intensity to tolerance with ice pack placed over left shoulder:  goal muscle re education; pain: no adverse reactions noted   Patient response to treatment: Patient demonstrated improved ROM with repetition and improved exercise technique with moderate cuing and demonstration.  improved pain level from 6/10 to 4/10     PT Education - 09/18/17 1108    Education provided  Yes    Education Details  HEP: AAROM on ball and supine short arc active motion forward elevation    Person(s) Educated  Patient    Methods  Explanation;Demonstration;Verbal cues    Comprehension  Verbalized understanding;Returned demonstration;Verbal cues required          PT Long Term Goals - 09/04/17 1403      PT LONG TERM GOAL #1   Title  Patient will improve FOTO score to 20/100 demonstrating improvement with functional use left UE for daily tasks    Baseline  FOTO 4/100; 09/04/17 4/100 no change due to severity of injury/surgery and continued restrictions on use/movement    Status  Not Met      PT LONG TERM GOAL #2   Title  Patient will improve FOTO score to 30/100 demonstrating improvement with functional use left UE for daily tasks    Baseline  FOTO 4/100    Status  On-going    Target Date  09/25/17      PT LONG TERM GOAL #3   Title  patient will be independent with home exercises for flexibility and strength to allow transition to self management once discharged from physical therapy    Baseline  requires assistance for ROM left shoulder, guidance and cuing to perform all exercises    Status  On-going    Target Date  09/25/17            Plan - 09/18/17 1111    Clinical Impression Statement  Patient is progressing steadily with ROM and strength. She continues with significant weakness left UE and pain as she heals from surgery. She will benefit from conitnued physical therapy intervention.     Rehab Potential  Good    Clinical Impairments Affecting Rehab Potential  (+) motivated, acute condition (-)obesity, DDD, lumbar     PT Frequency  2x / week    PT Duration  6 weeks    PT Treatment/Interventions  Manual techniques;Neuromuscular re-education;Patient/family education;Cryotherapy;Electrical Stimulation;Moist Heat;Therapeutic exercise;Therapeutic activities;Balance  training;Scar mobilization;Passive range of motion    PT Next Visit Plan  pain control, progressive exercises PROM/AAROM shoulder and AROM elbow to hand to improve control/strength left UE    PT Home Exercise Plan  AROM left elbow, wrist, hand, pendulum exercise for shoulder       Patient will benefit from skilled therapeutic intervention in order to improve the following deficits and impairments:  Pain, Impaired UE functional use, Impaired perceived functional ability, Decreased strength, Decreased range of motion, Decreased endurance, Decreased activity tolerance, Decreased balance, Obesity  Visit Diagnosis: Acute pain of left shoulder  Muscle weakness (generalized)  Stiffness of left shoulder, not elsewhere classified     Problem List Patient Active Problem List   Diagnosis Date Noted  . DVT (deep venous thrombosis) ([place]) 08/29/2017  . Leukocytosis 08/06/2017  . Humerus fracture 08/01/2017  . CPAP (continuous positive airway pressure) dependence 08/01/2017  . Asthma 08/01/2017  . HTN (hypertension) 06/24/2017  . Depression 11/07/2015  . Systolic murmur 67/20/9470  . Morbid obesity ([place]) 10/26/2014  . Insomnia 06/21/2014  . Anxiety 06/21/2014  . Spinal stenosis at L4-L5 level 06/02/2014  . POLYCYSTIC OVARIAN DISEASE 06/19/2010    Jomarie Longs PT 09/18/2017, 11:08 PM  [facility] [address] [place], [address] Phone: [phone]   Fax:  [phone]  Name: Danielle Irwin MRN: [id number] Date of Birth: 26-Feb-1971

## 2017-09-19 ENCOUNTER — Emergency Department: Payer: BLUE CROSS/BLUE SHIELD

## 2017-09-19 ENCOUNTER — Emergency Department
Admission: EM | Admit: 2017-09-19 | Discharge: 2017-09-19 | Disposition: A | Discharge status: Home / Self Care | Payer: BLUE CROSS/BLUE SHIELD | Attending: Emergency Medicine | Admitting: Emergency Medicine

## 2017-09-19 ENCOUNTER — Encounter: Payer: Self-pay | Admitting: Emergency Medicine

## 2017-09-19 DIAGNOSIS — S6991XA Unspecified injury of right wrist, hand and finger(s), initial encounter: Secondary | ICD-10-CM | POA: Diagnosis present

## 2017-09-19 DIAGNOSIS — Y92007 Garden or yard of unspecified non-institutional (private) residence as the place of occurrence of the external cause: Secondary | ICD-10-CM | POA: Diagnosis not present

## 2017-09-19 DIAGNOSIS — Z91040 Latex allergy status: Secondary | ICD-10-CM | POA: Diagnosis not present

## 2017-09-19 DIAGNOSIS — W19XXXA Unspecified fall, initial encounter: Secondary | ICD-10-CM | POA: Diagnosis not present

## 2017-09-19 DIAGNOSIS — M25512 Pain in left shoulder: Secondary | ICD-10-CM | POA: Diagnosis not present

## 2017-09-19 DIAGNOSIS — Y92009 Unspecified place in unspecified non-institutional (private) residence as the place of occurrence of the external cause: Secondary | ICD-10-CM

## 2017-09-19 DIAGNOSIS — S60221A Contusion of right hand, initial encounter: Secondary | ICD-10-CM | POA: Insufficient documentation

## 2017-09-19 DIAGNOSIS — Z79899 Other long term (current) drug therapy: Secondary | ICD-10-CM | POA: Insufficient documentation

## 2017-09-19 DIAGNOSIS — Z87891 Personal history of nicotine dependence: Secondary | ICD-10-CM | POA: Insufficient documentation

## 2017-09-19 DIAGNOSIS — Y999 Unspecified external cause status: Secondary | ICD-10-CM | POA: Insufficient documentation

## 2017-09-19 DIAGNOSIS — J45909 Unspecified asthma, uncomplicated: Secondary | ICD-10-CM | POA: Insufficient documentation

## 2017-09-19 DIAGNOSIS — Y939 Activity, unspecified: Secondary | ICD-10-CM | POA: Diagnosis not present

## 2017-09-19 DIAGNOSIS — Z7982 Long term (current) use of aspirin: Secondary | ICD-10-CM | POA: Insufficient documentation

## 2017-09-19 DIAGNOSIS — I10 Essential (primary) hypertension: Secondary | ICD-10-CM | POA: Diagnosis not present

## 2017-09-19 MED ORDER — OXYCODONE-ACETAMINOPHEN 5-325 MG PO TABS
1.0000 | ORAL_TABLET | Freq: Once | ORAL | Status: AC
Start: 2017-09-19 — End: 2017-09-19
  Administered 2017-09-19: 1 via ORAL
  Filled 2017-09-19: qty 1

## 2017-09-19 MED ORDER — METHOCARBAMOL 750 MG PO TABS: 750.0000 mg | ORAL_TABLET | Freq: Three times a day (TID) | ORAL | 0 refills | Status: DC

## 2017-09-19 NOTE — Discharge Instructions (Signed)
Your exam and x-rays are negative following your fall last night. Take your home medicines along with the muscle relaxant as directed. Follow-up with Dr. Doreatha Martin if symptoms worsen. Apply ice to any sore muscles as needed.

## 2017-09-19 NOTE — ED Triage Notes (Addendum)
Patient presents to the ED with left shoulder pain, left knee pain, right hand pain, sore neck and lower back.  Patient states she was picking something up in her yard yesterday evening and lost her balance and fell on her shoulder.  Patient had surgery on her left shoulder in February.  Patient is in no obvious distress at this time.  Ambulatory to triage with cane patient uses at baseline.  Patient has history of chronic back pain.

## 2017-09-19 NOTE — ED Provider Notes (Signed)
[facility] Emergency Department Provider Note ____________________________________________  Time seen: 1138  I have reviewed the triage vital signs and the nursing notes.  HISTORY  Chief Complaint  Fall  HPI Danielle Irwin is a 47 y.o. female presents to the ED for evaluation of injury sustained following mechanical fall at home at night. She descries losing her balance while at home in the yard yesterday.  She is about 6 weeks status post left shoulder ORIF following humeral head fracture. She was ambulating without her shoulder sling, at the time. She had recently had her 6-week post-op visit with Dr. Doreatha Martin. She complains of pain to the left knee, right palm, neck and lower back, as well. She denies any head injury or LOC. She was helped to her feet by her father.  She was advised to report here for shoulder evaluation because she was unable to secure an urgent appointment with her orthopedic provider.  She denies any chest pain, nausea, incontinence, or distal paresthesias.   Past Medical History:  Diagnosis Date  . Asthma   . Depression   . Family history of adverse reaction to anesthesia    FATHER WAS SLOW TO WAKE UP  . Headache    OCCASIONAL MIGRAINES  . HNP (herniated nucleus pulposus), lumbar   . Hx of epistaxis   . Insomnia   . OA (osteoarthritis)   . Obesity   . PCOS (polycystic ovarian syndrome)   . Sensitive skin   . Vitamin D deficiency     Patient Active Problem List   Diagnosis Date Noted  . DVT (deep venous thrombosis) ([place]) 08/29/2017  . Leukocytosis 08/06/2017  . Humerus fracture 08/01/2017  . CPAP (continuous positive airway pressure) dependence 08/01/2017  . Asthma 08/01/2017  . HTN (hypertension) 06/24/2017  . Depression 11/07/2015  . Systolic murmur 81/44/8185  . Morbid obesity ([place]) 10/26/2014  . Insomnia 06/21/2014  . Anxiety 06/21/2014  . Spinal stenosis at L4-L5 level 06/02/2014  . POLYCYSTIC OVARIAN DISEASE 06/19/2010     Past Surgical History:  Procedure Laterality Date  . LUMBAR LAMINECTOMY/DECOMPRESSION MICRODISCECTOMY Right 06/02/2014   Procedure: MICRO LUMBAR DECOMPRESSION L4 - L5 ON THE RIGHT 1 LEVEL;  Surgeon: Johnn Hai, MD;  Location: [facility];  Service: Orthopedics;  Laterality: Right;  . ORIF HUMERUS FRACTURE Left 08/01/2017   Procedure: OPEN REDUCTION INTERNAL FIXATION (ORIF) PROXIMAL HUMERUS FRACTURE;  Surgeon: Shona Needles, MD;  Location: [facility];  Service: Orthopedics;  Laterality: Left;  . WISDOM TOOTH EXTRACTION      Prior to Admission medications   Medication Sig Start Date End Date Taking? Authorizing Provider  albuterol (VENTOLIN HFA) 108 (90 Base) MCG/ACT inhaler Inhale 2 puffs into the lungs every 6 (six) hours as needed. Patient taking differently: Inhale 2 puffs into the lungs every 6 (six) hours as needed for wheezing or shortness of breath.  06/24/17   Lucille Passy, MD  Alum & Mag Hydroxide-Simeth (ANTACID ANTI-GAS PO) Take 1 tablet by mouth daily as needed (for gas).    [provider]  Ascorbic Acid (VITAMIN C) 1000 MG tablet Take 1,000 mg by mouth 2 (two) times daily.    [provider]  aspirin EC 81 MG tablet Take 81 mg by mouth daily.    [provider]  aspirin-acetaminophen-caffeine (EXCEDRIN MIGRAINE) [phone] MG tablet Take 2 tablets by mouth 2 (two) times daily as needed for headache or migraine.    [provider]  b complex vitamins tablet Take 1  tablet by mouth daily.    [provider]  Calcium Carbonate-Vitamin D (CALCIUM-D PO) Take 1,200 mg by mouth 2 (two) times daily.    [provider]  Cholecalciferol (VITAMIN D-3) 5000 units TABS Take 5,000 Units by mouth 2 (two) times daily.    [provider]  diphenhydrAMINE (BENADRYL) 25 MG tablet Take 25-50 mg by mouth every 6 (six) hours as needed for itching, allergies or sleep.    [provider]  docusate sodium (COLACE) 100 MG capsule Take 1  capsule (100 mg total) by mouth 2 (two) times daily as needed for mild constipation. 06/02/14   Susa Day, MD  [facility] SEAL PO Take 1 tablet by mouth 2 (two) times daily as needed (immune system boost).     [provider]  ELIQUIS STARTER PACK (ELIQUIS STARTER PACK) 5 MG TABS Take 5 mg by mouth 2 (two) times daily. 09/18/17   Algernon Huxley, MD  hydrochlorothiazide (HYDRODIURIL) 25 MG tablet Take 1 tablet (25 mg total) by mouth daily. Patient taking differently: Take 25-50 mg by mouth daily.  12/24/16   Lucille Passy, MD  HYDROcodone-acetaminophen (NORCO) 7.5-325 MG tablet Take 1-2 tablets by mouth every 4 (four) hours as needed for moderate pain ((score 4 to 6)). 08/02/17   Haddix, Thomasene Lot, MD  lisinopril (PRINIVIL,ZESTRIL) 10 MG tablet Take 1 tablet (10 mg total) by mouth daily. Patient taking differently: Take 9 mg by mouth at bedtime.  06/24/17   Lucille Passy, MD  Magnesium 500 MG TABS Take 500 mg by mouth at bedtime.    [provider]  Melatonin 10 MG TABS Take 1 tablet by mouth at bedtime.     [provider]  methocarbamol (ROBAXIN) 750 MG tablet Take 1 tablet (750 mg total) by mouth every 8 (eight) hours as needed for muscle spasms. 08/02/17   Haddix, Thomasene Lot, MD  methocarbamol (ROBAXIN) 750 MG tablet Take 1 tablet (750 mg total) by mouth 3 (three) times daily. 09/19/17   Ernestene Coover, Dannielle Karvonen, PA-C  Multiple Vitamin (MULTIVITAMIN WITH MINERALS) TABS tablet Take 1 tablet by mouth daily.    [provider]  Multiple Vitamins-Minerals (ZINC PO) Take 500 mg by mouth daily as needed (immune system boost).    [provider]  mupirocin ointment (BACTROBAN) 2 % Apply 1 application topically 2 (two) times daily. To affected area Patient taking differently: Apply 1 application topically 2 (two) times daily as needed (wound care).  12/28/14   Tower, Wynelle Fanny, MD  polyethylene glycol (MIRALAX / GLYCOLAX) packet Take 17 g by mouth daily as needed for  moderate constipation or severe constipation. 08/03/17   Alma Friendly, MD  senna-docusate (SENOKOT-S) 8.6-50 MG tablet Take 1 tablet by mouth at bedtime as needed for mild constipation. 08/03/17   Alma Friendly, MD  sertraline (ZOLOFT) 100 MG tablet Take 1 tablet (100 mg total) by mouth daily. 06/24/17   Lucille Passy, MD  silver sulfADIAZINE (SILVADENE) 1 % cream Apply 1 application topically 2 (two) times daily. Patient taking differently: Apply 1 application topically 2 (two) times daily as needed (wound care).  11/23/14   Pleas Koch, NP  St Johns Wort 300 MG CAPS Take 900 mg by mouth 2 (two) times daily.     [provider]  traMADol (ULTRAM) 50 MG tablet Take by mouth 2 (two) times daily as needed (back pain).    [provider]    Allergies Benzalkonium chloride;  Neosporin [neomycin-bacitracin zn-polymyx]; Bee venom; Bupropion hcl; Cephalexin; and Latex  Family History  Problem Relation Age of Onset  . Heart attack Father 50  . Breast cancer Maternal Aunt 72    Social History Social History   Tobacco Use  . Smoking status: Former Smoker    Last attempt to quit: 05/26/1990    Years since quitting: 27.3  . Smokeless tobacco: Never Used  Substance Use Topics  . Alcohol use: No    Alcohol/week: 0.0 oz  . Drug use: No    Review of Systems  Constitutional: Negative for fever. Eyes: Negative for visual changes. ENT: Negative for sore throat. Cardiovascular: Negative for chest pain. Respiratory: Negative for shortness of breath. Gastrointestinal: Negative for abdominal pain, vomiting and diarrhea. Genitourinary: Negative for dysuria. Musculoskeletal: Positive for mild neck, lower back pain, right hand and left shoulder pain Skin: Negative for rash. Neurological: Negative for headaches, focal weakness or numbness. ____________________________________________  PHYSICAL EXAM:  VITAL SIGNS: ED Triage Vitals  Enc Vitals Group     BP  09/19/17 1100 130/64     Pulse Rate 09/19/17 1100 75     Resp 09/19/17 1100 18     Temp 09/19/17 1100 98.5 F (36.9 C)     Temp Source 09/19/17 1100 Oral     SpO2 09/19/17 1100 97 %     Weight 09/19/17 1101 (!) 360 lb (163.3 kg)     Height 09/19/17 1101 5' 6.5" (1.689 m)     Head Circumference --      Peak Flow --      Pain Score --      Pain Loc --      Pain Edu? --      Excl. in [place]? --     Constitutional: Alert and oriented. Well appearing and in no distress. Head: Normocephalic and atraumatic. Eyes: Conjunctivae are normal. Normal extraocular movements Neck: Supple. No thyromegaly.  Normal range of motion without crepitus.  No midline tenderness or distracting injuries appreciated. Cardiovascular: Normal rate, regular rhythm. Normal distal pulses. Respiratory: Normal respiratory effort. No wheezes/rales/rhonchi. Musculoskeletal: Normal spinal alignment without midline tenderness, spasm, deformity, or step-off.  Left shoulder with well-healed anterior humerus surgical scar.  No obvious deformity is appreciated.  Elbow exam is benign with normal extension and pronation and supination of the forearm.  The right hand reveals palmar bruising over the fourth MCP.  Normal composite fist is appreciated.  The left knee is without any obvious deformity, dislocation, or effusion.  Patient with normal flexion and extension range on exam.  No popliteal space fullness is noted.  No valgus or varus joint stress is noted.  Nontender with normal range of motion in all other extremities.  Neurologic: Normal speech and language. No gross focal neurologic deficits are appreciated. Skin:  Skin is warm, dry and intact. No rash noted. ____________________________________________   RADIOLOGY  Right Hand  IMPRESSION: Normal exam.  Left Shoulder  IMPRESSION: No acute abnormality. Status post fixation of a proximal left humerus  fracture. ____________________________________________  PROCEDURES  Procedures Percocet 5-325 mg PO ____________________________________________  INITIAL IMPRESSION / ASSESSMENT AND PLAN / ED COURSE  Patient presents to the ED for hydration of injuries sustained following mechanical fall at home last night.  Patient's primary concern is to her left shoulder.  She is reassured by her left shoulder x-ray which shows no disruption of her previous hardware.  Her hand x-rays also negative for any fracture dislocation.  Patient symptoms likely represent myalgias and muscle  strain resulting from the fall.  She is discharged with a prescription for Robaxin to dose in addition to any home medications.  She will follow-up with her primary provider or return to the ED as needed. ____________________________________________  FINAL CLINICAL IMPRESSION(S) / ED DIAGNOSES  Final diagnoses:  Fall in home, initial encounter  Acute pain of left shoulder  Contusion of right hand, initial encounter      Melvenia Needles, PA-C 09/19/17 1558    Harvest Dark, MD 09/20/17 1103

## 2017-09-19 NOTE — ED Notes (Signed)
Pt back to room. Medication for pain administered. No other requests at this time. Will continue to assess.

## 2017-09-22 ENCOUNTER — Other Ambulatory Visit (INDEPENDENT_AMBULATORY_CARE_PROVIDER_SITE_OTHER): Payer: Self-pay

## 2017-09-22 ENCOUNTER — Encounter: Payer: BLUE CROSS/BLUE SHIELD | Admitting: Physical Therapy

## 2017-09-22 MED ORDER — ELIQUIS 5 MG VTE STARTER PACK: 5.0000 mg | ORAL_TABLET | Freq: Two times a day (BID) | ORAL | 7 refills | Status: DC

## 2017-09-23 ENCOUNTER — Ambulatory Visit: Payer: BLUE CROSS/BLUE SHIELD | Admitting: Physical Therapy

## 2017-09-23 ENCOUNTER — Encounter: Payer: Self-pay | Admitting: Physical Therapy

## 2017-09-23 DIAGNOSIS — M6281 Muscle weakness (generalized): Secondary | ICD-10-CM

## 2017-09-23 DIAGNOSIS — M25612 Stiffness of left shoulder, not elsewhere classified: Secondary | ICD-10-CM

## 2017-09-23 DIAGNOSIS — M25512 Pain in left shoulder: Secondary | ICD-10-CM

## 2017-09-23 NOTE — Therapy (Signed)
[facility] [address], [place], [address] Phone: [phone]   Fax:  [phone]  Physical Therapy Treatment  Patient Details  Name: Danielle Irwin MRN: [id number] Date of Birth: 21-Jul-1970 Referring Provider: Katha Hamming MD   Encounter Date: 09/23/2017  PT End of Session - 09/23/17 0921    Visit Number  12    Number of Visits  12    Date for PT Re-Evaluation  09/25/17    Authorization Type  5 of 6 FOTO    PT Start Time  0908    PT Stop Time  0946    PT Time Calculation (min)  38 min    Activity Tolerance  Patient tolerated treatment well;Patient limited by pain    Behavior During Therapy  [facility] for tasks assessed/performed       Past Medical History:  Diagnosis Date  . Asthma   . Depression   . Family history of adverse reaction to anesthesia    FATHER WAS SLOW TO WAKE UP  . Headache    OCCASIONAL MIGRAINES  . HNP (herniated nucleus pulposus), lumbar   . Hx of epistaxis   . Insomnia   . OA (osteoarthritis)   . Obesity   . PCOS (polycystic ovarian syndrome)   . Sensitive skin   . Vitamin D deficiency     Past Surgical History:  Procedure Laterality Date  . LUMBAR LAMINECTOMY/DECOMPRESSION MICRODISCECTOMY Right 06/02/2014   Procedure: MICRO LUMBAR DECOMPRESSION L4 - L5 ON THE RIGHT 1 LEVEL;  Surgeon: Johnn Hai, MD;  [facility]: [facility];  Service: Orthopedics;  Laterality: Right;  . ORIF HUMERUS FRACTURE Left 08/01/2017   Procedure: OPEN REDUCTION INTERNAL FIXATION (ORIF) PROXIMAL HUMERUS FRACTURE;  Surgeon: Shona Needles, MD;  Location: [place];  Service: Orthopedics;  Laterality: Left;  . WISDOM TOOTH EXTRACTION      There were no vitals filed for this visit.  Subjective Assessment - 09/23/17 1000    Subjective  Patient reports increased soreness in neck and shoulder following fall last week. She was seen in ED and had X rays which were negative for any changes to ORIF left shoulder.     Pertinent History  Patient reports she fell into a storm door at neighbors after stubbing toe on step 07/30/17. She felt immediate pain and numbness left UE and she could not move her left UE. She was transported to ED and then transported to [facility] in [place] and underwent surgery with ORIF 08/01/17 and released to home 08/03/17.     Limitations  Lifting;Walking;House hold activities out of work; personal care; staying at parent;s due to limitations    Patient Stated Goals  to be able to use her arm again for personal care, return to all ADL's with minimal difficutly    Currently in Pain?  Yes    Pain Score  7     Pain Location  Shoulder    Pain Orientation  Left    Pain Descriptors / Indicators  Aching;Sore    Pain Type  Acute pain;Surgical pain 08/01/2017    Pain Onset  1 to 4 weeks ago    Pain Frequency  Constant         Objective: Patient arrived in clinic with sling in place, ambulating with cane, antalgic gait pattern  Treatment:  Manual therapy:17mn.  STM superficial techniques to improve soft tissue elasticity; STM to cervical spine patient supine superficial techniques, compression techniques to left  upper trapezius  Therapeutic exercise:patient performed with assistance, verbal and tactile cues of therapist:  PROM/AAROM left shoulder 5 reps2sets: up to90+degreesflexion; pain limits further motion, AAROM cervical spine rotations x 10 reps   Modalities: Electrical stimulation:66mn:Russian stim. 10/10 cycle applied (2) electrodes toleft shoulder periscapular muscles rhomboids/lower trapezius with patient reclined with left UEsupported on pillow; ice pack placed over left shoulder:goal muscle re education; pain: no adverse reactions noted  Patient response to treatment: limited exercises due to recent fall and increased pain in left UE/shoulder. Improved soft tissue elasticity with decreased tenderness left shoulder/upper trapezius and cervical spine  following STM.        PT Education - 09/23/17 1010    Education provided  Yes    Education Details  use of ice, exercises for home    Person(s) Educated  Patient    Methods  Explanation;Demonstration;Verbal cues    Comprehension  Verbalized understanding;Returned demonstration;Verbal cues required          PT Long Term Goals - 09/04/17 1403      PT LONG TERM GOAL #1   Title  Patient will improve FOTO score to 20/100 demonstrating improvement with functional use left UE for daily tasks    Baseline  FOTO 4/100; 09/04/17 4/100 no change due to severity of injury/surgery and continued restrictions on use/movement    Status  Not Met      PT LONG TERM GOAL #2   Title  Patient will improve FOTO score to 30/100 demonstrating improvement with functional use left UE for daily tasks    Baseline  FOTO 4/100    Status  On-going    Target Date  09/25/17      PT LONG TERM GOAL #3   Title  patient will be independent with home exercises for flexibility and strength to allow transition to self management once discharged from physical therapy    Baseline  requires assistance for ROM left shoulder, guidance and cuing to perform all exercises    Status  On-going    Target Date  09/25/17            Plan - 09/23/17 1100    Clinical Impression Statement  Limited exercises today due to patient with increased pain/soreness following fall last week. Improved soft tissue elasticity and decreased pain with treatment today. she should improve ROM and strength with additional physical therapy intervention.    Rehab Potential  Good    Clinical Impairments Affecting Rehab Potential  (+) motivated, acute condition (-)obesity, DDD, lumbar     PT Frequency  2x / week    PT Duration  6 weeks    PT Treatment/Interventions  Manual techniques;Neuromuscular re-education;Patient/family education;Cryotherapy;Electrical Stimulation;Moist Heat;Therapeutic exercise;Therapeutic activities;Balance training;Scar  mobilization;Passive range of motion    PT Next Visit Plan  pain control, progressive exercises PROM/AAROM shoulder and AROM elbow to hand to improve control/strength left UE    PT Home Exercise Plan  AROM left elbow, wrist, hand, pendulum exercise for shoulder       Patient will benefit from skilled therapeutic intervention in order to improve the following deficits and impairments:  Pain, Impaired UE functional use, Impaired perceived functional ability, Decreased strength, Decreased range of motion, Decreased endurance, Decreased activity tolerance, Decreased balance, Obesity  Visit Diagnosis: Acute pain of left shoulder  Muscle weakness (generalized)  Stiffness of left shoulder, not elsewhere classified     Problem List Patient Active Problem List   Diagnosis Date Noted  . DVT (deep venous thrombosis) ([place] 08/29/2017  .  Leukocytosis 08/06/2017  . Humerus fracture 08/01/2017  . CPAP (continuous positive airway pressure) dependence 08/01/2017  . Asthma 08/01/2017  . HTN (hypertension) 06/24/2017  . Depression 11/07/2015  . Systolic murmur 12/82/0813  . Morbid obesity ([place]) 10/26/2014  . Insomnia 06/21/2014  . Anxiety 06/21/2014  . Spinal stenosis at L4-L5 level 06/02/2014  . POLYCYSTIC OVARIAN DISEASE 06/19/2010    Jomarie Longs PT 09/23/2017, 10:35 PM  [facility] [address] [place], [address] Phone: [phone]   Fax:  [phone]  Name: ADEOLA DENNEN MRN: [id number] Date of Birth: December 21, 1970

## 2017-09-25 ENCOUNTER — Ambulatory Visit: Payer: BLUE CROSS/BLUE SHIELD | Admitting: Physical Therapy

## 2017-09-29 ENCOUNTER — Encounter: Payer: Self-pay | Admitting: Physical Therapy

## 2017-09-29 ENCOUNTER — Ambulatory Visit: Payer: BLUE CROSS/BLUE SHIELD | Admitting: Physical Therapy

## 2017-09-29 DIAGNOSIS — M25612 Stiffness of left shoulder, not elsewhere classified: Secondary | ICD-10-CM

## 2017-09-29 DIAGNOSIS — M25512 Pain in left shoulder: Secondary | ICD-10-CM

## 2017-09-29 DIAGNOSIS — M6281 Muscle weakness (generalized): Secondary | ICD-10-CM

## 2017-09-29 NOTE — Therapy (Signed)
[facility] [address], [place], [address] Phone: [phone]   Fax:  [phone]  Physical Therapy Treatment  Patient Details  Name: Danielle Irwin MRN: [id number] Date of Birth: 10/11/1970 Referring Provider: Katha Hamming MD   Encounter Date: 09/29/2017  PT End of Session - 09/29/17 1304    Visit Number  13    Number of Visits  24    Date for PT Re-Evaluation  11/10/17    Authorization Type  6 of 6 FOTO    PT Start Time  1145    PT Stop Time  1230    PT Time Calculation (min)  45 min    Activity Tolerance  Patient tolerated treatment well;Patient limited by pain    Behavior During Therapy  [facility] for tasks assessed/performed       Past Medical History:  Diagnosis Date  . Asthma   . Depression   . Family history of adverse reaction to anesthesia    FATHER WAS SLOW TO WAKE UP  . Headache    OCCASIONAL MIGRAINES  . HNP (herniated nucleus pulposus), lumbar   . Hx of epistaxis   . Insomnia   . OA (osteoarthritis)   . Obesity   . PCOS (polycystic ovarian syndrome)   . Sensitive skin   . Vitamin D deficiency     Past Surgical History:  Procedure Laterality Date  . LUMBAR LAMINECTOMY/DECOMPRESSION MICRODISCECTOMY Right 06/02/2014   Procedure: MICRO LUMBAR DECOMPRESSION L4 - L5 ON THE RIGHT 1 LEVEL;  Surgeon: Johnn Hai, MD;  [facility];  Service: Orthopedics;  Laterality: Right;  . ORIF HUMERUS FRACTURE Left 08/01/2017   Procedure: OPEN REDUCTION INTERNAL FIXATION (ORIF) PROXIMAL HUMERUS FRACTURE;  Surgeon: Shona Needles, MD;  Location: [place];  Service: Orthopedics;  Laterality: Left;  . WISDOM TOOTH EXTRACTION      There were no vitals filed for this visit.  Subjective Assessment - 09/29/17 1207    Subjective  Patient reports she is having increased soreness in her left shoulder today with stabbing sensations in her upper arm.     Pertinent History  Patient reports she fell into a  storm door at neighbors after stubbing toe on step 07/30/17. She felt immediate pain and numbness left UE and she could not move her left UE. She was transported to ED and then transported to [facility] in [place] and underwent surgery with ORIF 08/01/17 and released to home 08/03/17.     Limitations  Lifting;Walking;House hold activities out of work; personal care; staying at parent;s due to limitations    Patient Stated Goals  to be able to use her arm again for personal care, return to all ADL's with minimal difficutly    Currently in Pain?  Yes    Pain Score  6     Pain Location  Shoulder    Pain Orientation  Left    Pain Descriptors / Indicators  Aching;Sore    Pain Type  Acute pain;Surgical pain 08/01/2017    Pain Onset  More than a month ago    Pain Frequency  Constant          Objective: Patient arrived in clinic with sling in place, ambulating with cane, antalgic gait pattern AAROM: left shoulder forward elevation up to 100 degrees with pain; ER up to 25 degrees (supine lying) Palpation: point tender along bilateral upper trapezius and cervical spine muscles, left pectoral muscle   Treatment:  Manual therapy: 13 min.  STM superficial techniques to improve soft tissue elasticity; STM to cervical spine, pectoralis muscle with patient supine superficial techniques, compression techniques to left upper trapezius    Therapeutic exercise: patient performed with assistance, verbal and tactile cues of therapist:  PROM/AAROM left shoulder 5 reps 2 sets: up to 90+ degrees  flexion; pain limits further motion; ER/IR AAROM within pain free motion ~ 30 degrees AAROM left elbow flexion/extension x 10 reps   Modalities: Electrical stimulation: 20 IDP:OEUMPNT stim. 10/10 cycle applied (2) electrodes to left shoulder periscapular muscles rhomboids/lower trapezius with patient reclined with left UE supported on pillow:  goal muscle re education; pain: no adverse reactions noted Moist heat  applied to left shoulder/upper trapezius and pectoral muscles x 10 min prior to exercise and STM (unbilled time)    Patient response to treatment: limited exercises due to pain. improved with STM to upper trapezius, cervical spine and pectoral muscles left side.       PT Education - 09/29/17 1547    Education provided  Yes    Education Details  exercise instruction for UE ranger, possible nerve hypersensitivity in upper arm left as she is healing from injury    Person(s) Educated  Patient    Methods  Explanation;Demonstration;Verbal cues    Comprehension  Verbalized understanding;Returned demonstration;Verbal cues required          PT Long Term Goals - 09/29/17 1549      PT LONG TERM GOAL #1   Title  Patient will improve FOTO score to 25/100 demonstrating improvement with functional use left UE for daily tasks    Baseline  FOTO 4/100; 09/04/17 4/100 no change due to severity of injury/surgery and continued restrictions on use/movement    Status  Revised    Target Date  11/10/17      PT LONG TERM GOAL #2   Title  Patient will improve FOTO score to 40/100 demonstrating improvement with functional use left UE for daily tasks    Baseline  FOTO 4/100    Status  Revised    Target Date  11/10/17      PT LONG TERM GOAL #3   Title  patient will be independent with home exercises for flexibility and strength to allow transition to self management once discharged from physical therapy    Baseline  requires assistance for ROM left shoulder, guidance and cuing to perform all exercises    Status  On-going    Target Date  11/10/17            Plan - 09/29/17 1305    Clinical Impression Statement  Patient is responding slowly with ROM and functional use left UE as she heals from fracture/surgery. Patient is progressing steadily with goals including ROM and strength. She continues with significant weakness left UE and pain as she heals from surgery. She will benefit from continued  physical therapy intervention to address strength and ROM in order to return to prior level of function.     Rehab Potential  Good    Clinical Impairments Affecting Rehab Potential  (+) motivated, acute condition (-)obesity, DDD, lumbar     PT Frequency  2x / week    PT Duration  6 weeks    PT Treatment/Interventions  Manual techniques;Neuromuscular re-education;Patient/family education;Cryotherapy;Electrical Stimulation;Moist Heat;Therapeutic exercise;Therapeutic activities;Balance training;Scar mobilization;Passive range of motion    PT Next Visit Plan  pain control, progressive exercises PROM/AAROM shoulder and AROM elbow to hand to improve control/strength left UE  PT Home Exercise Plan  AROM left elbow, wrist, hand, pendulum exercise for shoulder    Consulted and Agree with Plan of Care  Patient       Patient will benefit from skilled therapeutic intervention in order to improve the following deficits and impairments:  Pain, Impaired UE functional use, Impaired perceived functional ability, Decreased strength, Decreased range of motion, Decreased endurance, Decreased activity tolerance, Decreased balance, Obesity  Visit Diagnosis: Acute pain of left shoulder - Plan: PT plan of care cert/re-cert  Muscle weakness (generalized) - Plan: PT plan of care cert/re-cert  Stiffness of left shoulder, not elsewhere classified - Plan: PT plan of care cert/re-cert     Problem List Patient Active Problem List   Diagnosis Date Noted  . DVT (deep venous thrombosis) ([place]) 08/29/2017  . Leukocytosis 08/06/2017  . Humerus fracture 08/01/2017  . CPAP (continuous positive airway pressure) dependence 08/01/2017  . Asthma 08/01/2017  . HTN (hypertension) 06/24/2017  . Depression 11/07/2015  . Systolic murmur 93/57/0177  . Morbid obesity ([place]) 10/26/2014  . Insomnia 06/21/2014  . Anxiety 06/21/2014  . Spinal stenosis at L4-L5 level 06/02/2014  . POLYCYSTIC OVARIAN DISEASE 06/19/2010     Jomarie Longs PT 09/29/2017, 11:02 PM  [place] [facility] [address] [place], [address] Phone: [phone]   Fax:  [phone]  Name: Danielle Irwin MRN: [id number] Date of Birth: 02/08/1971

## 2017-10-02 ENCOUNTER — Encounter: Payer: Self-pay | Admitting: Physical Therapy

## 2017-10-02 ENCOUNTER — Ambulatory Visit: Payer: BLUE CROSS/BLUE SHIELD | Admitting: Physical Therapy

## 2017-10-02 DIAGNOSIS — M25512 Pain in left shoulder: Secondary | ICD-10-CM

## 2017-10-02 DIAGNOSIS — M6281 Muscle weakness (generalized): Secondary | ICD-10-CM

## 2017-10-02 DIAGNOSIS — M25612 Stiffness of left shoulder, not elsewhere classified: Secondary | ICD-10-CM

## 2017-10-02 NOTE — Therapy (Signed)
[facility] [address] [place], [address] Phone: [phone]   Fax:  [phone]  Physical Therapy Treatment  Patient Details  Name: Danielle Irwin MRN: [id number] Date of Birth: 1970/08/01 Referring Provider: Katha Hamming MD   Encounter Date: 10/02/2017  PT End of Session - 10/02/17 1306    Visit Number  14    Number of Visits  24    Date for PT Re-Evaluation  11/10/17    Authorization Type  7 of 6 FOTO    PT Start Time  1259    PT Stop Time  1345    PT Time Calculation (min)  46 min    Activity Tolerance  Patient tolerated treatment well;Patient limited by pain    Behavior During Therapy  [facility] for tasks assessed/performed       Past Medical History:  Diagnosis Date  . Asthma   . Depression   . Family history of adverse reaction to anesthesia    FATHER WAS SLOW TO WAKE UP  . Headache    OCCASIONAL MIGRAINES  . HNP (herniated nucleus pulposus), lumbar   . Hx of epistaxis   . Insomnia   . OA (osteoarthritis)   . Obesity   . PCOS (polycystic ovarian syndrome)   . Sensitive skin   . Vitamin D deficiency     Past Surgical History:  Procedure Laterality Date  . LUMBAR LAMINECTOMY/DECOMPRESSION MICRODISCECTOMY Right 06/02/2014   Procedure: MICRO LUMBAR DECOMPRESSION L4 - L5 ON THE RIGHT 1 LEVEL;  Surgeon: Johnn Hai, MD;  [facility];  Service: Orthopedics;  Laterality: Right;  . ORIF HUMERUS FRACTURE Left 08/01/2017   Procedure: OPEN REDUCTION INTERNAL FIXATION (ORIF) PROXIMAL HUMERUS FRACTURE;  Surgeon: Shona Needles, MD;  Location: [place];  Service: Orthopedics;  Laterality: Left;  . WISDOM TOOTH EXTRACTION      There were no vitals filed for this visit.  Subjective Assessment - 10/02/17 1303    Subjective  Patient reports pain in left  upper arm and shoulder with mimimal movement.     Pertinent History  Patient reports she fell into a storm door at neighbors after stubbing toe  on step 07/30/17. She felt immediate pain and numbness left UE and she could not move her left UE. She was transported to ED and then transported to [facility] in [place] and underwent surgery with ORIF 08/01/17 and released to home 08/03/17.     Limitations  Lifting;Walking;House hold activities out of work; personal care; staying at parent;s due to limitations    Patient Stated Goals  to be able to use her arm again for personal care, return to all ADL's with minimal difficutly    Currently in Pain?  Yes    Pain Score  5     Pain Location  Shoulder    Pain Orientation  Left    Pain Descriptors / Indicators  Aching;Sore    Pain Type  Acute pain;Surgical pain 08/01/2017    Pain Onset  More than a month ago    Pain Frequency  Intermittent with medication, ice, rest       Objective: Palpation: point tender along bilateral upper trapezius and cervical spine muscles, left pectoral muscle  Treatment:  Manual therapy:59min.  STM superficial techniques to improve soft tissue elasticity; STM to cervical spine, pectoralis muscle with patient supinesuperficial techniques, compression techniques to left upper trapezius  Therapeutic exercise:patient performed with assistance, verbal and tactile cues of  therapist:  PROM/AAROM left shoulder5reps3sets: up to90+degreesflexion; pain limits further motion; ER/IR AAROM within pain free motion ~ 30 degrees AAROM left elbow flexion/extension x 10 reps Seated UE ranger for forward flexion and rotations 2 sets 15 reps each with reported increased pain in left shoulder   Modalities: Electrical stimulation:29min:Russian stim. 10/10 cycle applied (2) electrodes toleft shoulder periscapular muscles rhomboids/lower and (2) electrodes applied to right shoulder anterior/posterior aspect modulating current, intensity to tolerance  with patient reclined with left UEsupported on pillow:goal muscle re education; pain: no adverse reactions noted Ice  pack (unbilled time) applied to left shoulder/upper trapezius and pectoral muscles x 10 min prior to exercise and post exercise with estim for pain control   Patient response to treatment: patient demonstrated improved technique with exercises with minimal VC for correct alignment. Patient with decreased pain from   /10 to  /10. Patient with decreased spasms by    following STM. Improved motor control with repetition and cuing, following estim.      PT Education - 10/02/17 1306    Education provided  Yes    Education Details  exercise instruction    Person(s) Educated  Patient    Methods  Explanation;Demonstration;Verbal cues    Comprehension  Verbalized understanding;Returned demonstration;Verbal cues required          PT Long Term Goals - 09/29/17 1549      PT LONG TERM GOAL #1   Title  Patient will improve FOTO score to 25/100 demonstrating improvement with functional use left UE for daily tasks    Baseline  FOTO 4/100; 09/04/17 4/100 no change due to severity of injury/surgery and continued restrictions on use/movement    Status  Revised    Target Date  11/10/17      PT LONG TERM GOAL #2   Title  Patient will improve FOTO score to 40/100 demonstrating improvement with functional use left UE for daily tasks    Baseline  FOTO 4/100    Status  Revised    Target Date  11/10/17      PT LONG TERM GOAL #3   Title  patient will be independent with home exercises for flexibility and strength to allow transition to self management once discharged from physical therapy    Baseline  requires assistance for ROM left shoulder, guidance and cuing to perform all exercises    Status  On-going    Target Date  11/10/17            Plan - 10/02/17 1314    Clinical Impression Statement  Patient continues with pain as primary limiting factor of pain for functional use of left UE s/p fracture/ORIF. She is having less pain intensity intermittently. She will benefit from continued physical  therapy intervention to address limitations in strength and ROM in order to return to prior level of function.     Rehab Potential  Good    Clinical Impairments Affecting Rehab Potential  (+) motivated, acute condition (-)obesity, DDD, lumbar     PT Frequency  2x / week    PT Duration  6 weeks    PT Treatment/Interventions  Manual techniques;Neuromuscular re-education;Patient/family education;Cryotherapy;Electrical Stimulation;Moist Heat;Therapeutic exercise;Therapeutic activities;Balance training;Scar mobilization;Passive range of motion    PT Next Visit Plan  pain control, progressive exercises PROM/AAROM shoulder and AROM elbow to hand to improve control/strength left UE    PT Home Exercise Plan  AROM left elbow, wrist, hand, pendulum exercise for shoulder       Patient will benefit  from skilled therapeutic intervention in order to improve the following deficits and impairments:  Pain, Impaired UE functional use, Impaired perceived functional ability, Decreased strength, Decreased range of motion, Decreased endurance, Decreased activity tolerance, Decreased balance, Obesity  Visit Diagnosis: Acute pain of left shoulder  Muscle weakness (generalized)  Stiffness of left shoulder, not elsewhere classified     Problem List Patient Active Problem List   Diagnosis Date Noted  . DVT (deep venous thrombosis) ([place]) 08/29/2017  . Leukocytosis 08/06/2017  . Humerus fracture 08/01/2017  . CPAP (continuous positive airway pressure) dependence 08/01/2017  . Asthma 08/01/2017  . HTN (hypertension) 06/24/2017  . Depression 11/07/2015  . Systolic murmur 08/02/3610  . Morbid obesity ([place]) 10/26/2014  . Insomnia 06/21/2014  . Anxiety 06/21/2014  . Spinal stenosis at L4-L5 level 06/02/2014  . POLYCYSTIC OVARIAN DISEASE 06/19/2010    Jomarie Longs PT 10/03/2017, 9:10 AM  [place] [facility] [address], [place],  [address] Phone: [phone]   Fax:  [phone]  Name: Danielle Irwin MRN: [id number] Date of Birth: 08-31-1970

## 2017-10-06 ENCOUNTER — Ambulatory Visit: Payer: BLUE CROSS/BLUE SHIELD | Admitting: Physical Therapy

## 2017-10-06 ENCOUNTER — Encounter: Payer: Self-pay | Admitting: Physical Therapy

## 2017-10-06 DIAGNOSIS — M25512 Pain in left shoulder: Secondary | ICD-10-CM | POA: Diagnosis not present

## 2017-10-06 DIAGNOSIS — M6281 Muscle weakness (generalized): Secondary | ICD-10-CM

## 2017-10-06 DIAGNOSIS — M25612 Stiffness of left shoulder, not elsewhere classified: Secondary | ICD-10-CM

## 2017-10-06 NOTE — Therapy (Signed)
[facility] [address], [place], [address] Phone: [phone]   Fax:  [phone]  Physical Therapy Treatment  Patient Details  Name: Danielle Irwin MRN: [id number] Date of Birth: 1971-05-20 Referring Provider: Katha Hamming MD   Encounter Date: 10/06/2017  PT End of Session - 10/06/17 1229    Visit Number  15    Number of Visits  24    Date for PT Re-Evaluation  11/10/17    Authorization Type  2 of 6 FOTO    PT Start Time  6962    PT Stop Time  1231    PT Time Calculation (min)  49 min    Activity Tolerance  Patient tolerated treatment well;Patient limited by pain    Behavior During Therapy  [facility] for tasks assessed/performed       Past Medical History:  Diagnosis Date  . Asthma   . Depression   . Family history of adverse reaction to anesthesia    FATHER WAS SLOW TO WAKE UP  . Headache    OCCASIONAL MIGRAINES  . HNP (herniated nucleus pulposus), lumbar   . Hx of epistaxis   . Insomnia   . OA (osteoarthritis)   . Obesity   . PCOS (polycystic ovarian syndrome)   . Sensitive skin   . Vitamin D deficiency     Past Surgical History:  Procedure Laterality Date  . LUMBAR LAMINECTOMY/DECOMPRESSION MICRODISCECTOMY Right 06/02/2014   Procedure: MICRO LUMBAR DECOMPRESSION L4 - L5 ON THE RIGHT 1 LEVEL;  Surgeon: Johnn Hai, MD;  Location: [facility];  Service: Orthopedics;  Laterality: Right;  . ORIF HUMERUS FRACTURE Left 08/01/2017   Procedure: OPEN REDUCTION INTERNAL FIXATION (ORIF) PROXIMAL HUMERUS FRACTURE;  Surgeon: Shona Needles, MD;  Location: [facility];  Service: Orthopedics;  Laterality: Left;  . WISDOM TOOTH EXTRACTION      There were no vitals filed for this visit.  Subjective Assessment - 10/06/17 1147    Subjective  Patient reports she is improving slowly with left UE with continued pain and weakness.     Pertinent History  Patient reports she fell into a storm door at neighbors after  stubbing toe on step 07/30/17. She felt immediate pain and numbness left UE and she could not move her left UE. She was transported to ED and then transported to [facility] in [place] and underwent surgery with ORIF 08/01/17 and released to home 08/03/17.     Limitations  Lifting;Walking;House hold activities out of work; personal care; staying at parent;s due to limitations    Patient Stated Goals  to be able to use her arm again for personal care, return to all ADL's with minimal difficutly    Currently in Pain?  Yes    Pain Score  4     Pain Location  Shoulder    Pain Orientation  Left    Pain Descriptors / Indicators  Aching;Sore    Pain Type  Acute pain;Surgical pain 08/01/2017    Pain Onset  More than a month ago    Pain Frequency  Intermittent         Objective: Palpation: point tender along left upper trapezius and left pectoral muscle   Treatment:  Manual therapy: 10 min.  STM superficial techniques to improve soft tissue elasticity; STM upper trapezius and left pectoralis muscle with patient supine superficial techniques, compression techniques to left upper trapezius    Therapeutic exercise: patient performed with assistance, verbal  and tactile cues of therapist:  AAROM left shoulder 5 reps 3 sets: up to 90+ degrees  flexion; pain limits further motion; ER/IR AAROM within pain free motion ~ 30 degrees PROM 2 x 5 reps with hold end range with pain increased at 90+ degrees AAROM left elbow flexion/extension x 10 reps Seated UE ranger for forward flexion and rotations 2 sets 15 reps each with reported fatigue and pain in left shoulder limiting further motion/repetitions   Modalities: Electrical stimulation: 20 NWG:[id number] stim. 10/10 cycle applied (2) electrodes to left shoulder periscapular muscles rhomboids/lower intensity to tolerance  with patient reclined with left UE supported on pillow:  goal muscle re education; pain: no adverse reactions noted Ice pack (unbilled time)  applied to left shoulder/upper trapezius and pectoral muscles x 10 post exercise for pain control; no adverse reactions noted   Patient response to treatment: Patient improved motor conrol with exercises with repetition. Patient reported increased pain with repetition of exercises, decreased quickly following rest.      PT Education - 10/06/17 1222    Education provided  Yes    Education Details  exercise instruction for technique    Person(s) Educated  Patient    Methods  Explanation;Demonstration;Verbal cues    Comprehension  Verbalized understanding;Verbal cues required;Returned demonstration          PT Long Term Goals - 09/29/17 1549      PT LONG TERM GOAL #1   Title  Patient will improve FOTO score to 25/100 demonstrating improvement with functional use left UE for daily tasks    Baseline  FOTO 4/100; 09/04/17 4/100 no change due to severity of injury/surgery and continued restrictions on use/movement    Status  Revised    Target Date  11/10/17      PT LONG TERM GOAL #2   Title  Patient will improve FOTO score to 40/100 demonstrating improvement with functional use left UE for daily tasks    Baseline  FOTO 4/100    Status  Revised    Target Date  11/10/17      PT LONG TERM GOAL #3   Title  patient will be independent with home exercises for flexibility and strength to allow transition to self management once discharged from physical therapy    Baseline  requires assistance for ROM left shoulder, guidance and cuing to perform all exercises    Status  On-going    Target Date  11/10/17            Plan - 10/06/17 1422    Clinical Impression Statement  Patient is progressing with strength, ROM with pain and weakness as primary limiting factors. She continues to benefit from physical therapy ro improve strenth and ROM in order to return to prior level of function.     Rehab Potential  Good    Clinical Impairments Affecting Rehab Potential  (+) motivated, acute condition  (-)obesity, DDD, lumbar     PT Frequency  2x / week    PT Duration  6 weeks    PT Treatment/Interventions  Manual techniques;Neuromuscular re-education;Patient/family education;Cryotherapy;Electrical Stimulation;Moist Heat;Therapeutic exercise;Therapeutic activities;Balance training;Scar mobilization;Passive range of motion    PT Next Visit Plan  pain control, progressive exercises PROM/AAROM shoulder and AROM elbow to hand to improve control/strength left UE    PT Home Exercise Plan  AROM left elbow, wrist, hand, pendulum exercise for shoulder       Patient will benefit from skilled therapeutic intervention in order to improve the following deficits  and impairments:  Pain, Impaired UE functional use, Impaired perceived functional ability, Decreased strength, Decreased range of motion, Decreased endurance, Decreased activity tolerance, Decreased balance, Obesity  Visit Diagnosis: Acute pain of left shoulder  Muscle weakness (generalized)  Stiffness of left shoulder, not elsewhere classified     Problem List Patient Active Problem List   Diagnosis Date Noted  . DVT (deep venous thrombosis) ([place]) 08/29/2017  . Leukocytosis 08/06/2017  . Humerus fracture 08/01/2017  . CPAP (continuous positive airway pressure) dependence 08/01/2017  . Asthma 08/01/2017  . HTN (hypertension) 06/24/2017  . Depression 11/07/2015  . Systolic murmur 16/06/930  . Morbid obesity ([place]) 10/26/2014  . Insomnia 06/21/2014  . Anxiety 06/21/2014  . Spinal stenosis at L4-L5 level 06/02/2014  . POLYCYSTIC OVARIAN DISEASE 06/19/2010    Jomarie Longs PT 10/06/2017, 2:24 PM  [facility] [address], [place], [address] Phone: [phone]   Fax:  [phone]  Name: Danielle Irwin MRN: [id number] Date of Birth: Mar 08, 1971

## 2017-10-09 ENCOUNTER — Encounter: Payer: Self-pay | Admitting: Physical Therapy

## 2017-10-09 ENCOUNTER — Ambulatory Visit: Payer: BLUE CROSS/BLUE SHIELD | Attending: Student | Admitting: Physical Therapy

## 2017-10-09 DIAGNOSIS — M6281 Muscle weakness (generalized): Secondary | ICD-10-CM | POA: Diagnosis present

## 2017-10-09 DIAGNOSIS — M25612 Stiffness of left shoulder, not elsewhere classified: Secondary | ICD-10-CM | POA: Insufficient documentation

## 2017-10-09 DIAGNOSIS — M25512 Pain in left shoulder: Secondary | ICD-10-CM | POA: Diagnosis not present

## 2017-10-09 DIAGNOSIS — M62838 Other muscle spasm: Secondary | ICD-10-CM | POA: Insufficient documentation

## 2017-10-09 NOTE — Therapy (Signed)
[facility] [address], [place], [address] Phone: [phone]   Fax:  [phone]  Physical Therapy Treatment  Patient Details  Name: Danielle Irwin MRN: [id number] Date of Birth: 1971/04/04 Referring Provider: Katha Hamming MD   Encounter Date: 10/09/2017  PT End of Session - 10/09/17 1356    Visit Number  16    Number of Visits  24    Date for PT Re-Evaluation  11/10/17    Authorization Type  3 of 6 FOTO    PT Start Time  1348    PT Stop Time  1431    PT Time Calculation (min)  43 min    Activity Tolerance  Patient tolerated treatment well;Patient limited by pain    Behavior During Therapy  [facility] for tasks assessed/performed       Past Medical History:  Diagnosis Date  . Asthma   . Depression   . Family history of adverse reaction to anesthesia    FATHER WAS SLOW TO WAKE UP  . Headache    OCCASIONAL MIGRAINES  . HNP (herniated nucleus pulposus), lumbar   . Hx of epistaxis   . Insomnia   . OA (osteoarthritis)   . Obesity   . PCOS (polycystic ovarian syndrome)   . Sensitive skin   . Vitamin D deficiency     Past Surgical History:  Procedure Laterality Date  . LUMBAR LAMINECTOMY/DECOMPRESSION MICRODISCECTOMY Right 06/02/2014   Procedure: MICRO LUMBAR DECOMPRESSION L4 - L5 ON THE RIGHT 1 LEVEL;  Surgeon: Johnn Hai, MD;  Location: [facility];  Service: Orthopedics;  Laterality: Right;  . ORIF HUMERUS FRACTURE Left 08/01/2017   Procedure: OPEN REDUCTION INTERNAL FIXATION (ORIF) PROXIMAL HUMERUS FRACTURE;  Surgeon: Shona Needles, MD;  Location: [facility];  Service: Orthopedics;  Laterality: Left;  . WISDOM TOOTH EXTRACTION      There were no vitals filed for this visit.  Subjective Assessment - 10/09/17 1350    Subjective  Patient reports she is improving with use left UE and still cannot reach across to put deodorant on under right arm or raise arm up through much range at all. She is able to  raise left arm with less difficulty and pain however.     Pertinent History  Patient reports she fell into a storm door at neighbors after stubbing toe on step 07/30/17. She felt immediate pain and numbness left UE and she could not move her left UE. She was transported to ED and then transported to [facility] in [place] and underwent surgery with ORIF 08/01/17 and released to home 08/03/17.     Limitations  Lifting;Walking;House hold activities out of work; personal care; staying at parent;s due to limitations    Patient Stated Goals  to be able to use her arm again for personal care, return to all ADL's with minimal difficutly    Currently in Pain?  Yes    Pain Score  3  with resting and following raising arm.    Pain Location  Shoulder    Pain Orientation  Left    Pain Descriptors / Indicators  Aching;Sore    Pain Type  Acute pain;Surgical pain 08/01/2017    Pain Onset  More than a month ago    Pain Frequency  Intermittent      Objective: Palpation: point tender along left upper trapezius and left pectoral muscle AROM: left shoulder forward elevation sitting: 0-60 with effort and pain end range  AAROM: left shoulder forward elevation supine: 0- ~110 degrees with pain limiting further motion; ER 0-30 degrees with pain limiting further motion   Treatment:  Manual therapy: 10 min.  STM superficial techniques to improve soft tissue elasticity; STM upper trapezius and left pectoralis muscle with patient supine superficial techniques, compression techniques to left upper trapezius    Therapeutic exercise: patient performed with assistance, verbal and tactile cues of therapist:  Supiine: AAROM left shoulder 5 reps 3 sets: up to 90+ degrees  flexion; pain limits further motion; ER/IR AAROM within pain free motion ~ 30 degrees PROM 2 x 5 reps with hold end range with pain increased at 90+ degrees  Sitting:  AAROM left elbow flexion/extension 2 x 10 reps Seated UE ranger for forward flexion and  rotations 2 sets 1 min each with reported fatigue and pain in left shoulder limiting further motion/repetitions AAROM forward elevation with therapist assistance 2 x 5 reps/ IR/ER x 10 reps   Modalities: Electrical stimulation: 17 FBP:[id number] stim. 10/10 cycle applied (2) electrodes to left shoulder periscapular muscles rhomboids/lower intensity to tolerance  with patient reclined with left UE supported on pillow: (in conjunction with exercise) goal muscle re education; pain: no adverse reactions noted Ice pack (unbilled time), in conjunction with estim.: applied to left shoulder/upper trapezius and pectoral muscles x 10 post exercise for pain control; no adverse reactions noted   Patient response to treatment: Patient demonstrated improved ROM with assistance and repetition. Improved soft tissue elasticity 50% with STM to anterior aspect of left shoulder/upper arm. Full contraction periscapular muscles with estim.     PT Education - 10/09/17 1355    Education provided  Yes    Education Details  exercise instruction for technique     Person(s) Educated  Patient    Methods  Explanation;Demonstration;Verbal cues    Comprehension  Verbal cues required;Returned demonstration;Verbalized understanding          PT Long Term Goals - 09/29/17 1549      PT LONG TERM GOAL #1   Title  Patient will improve FOTO score to 25/100 demonstrating improvement with functional use left UE for daily tasks    Baseline  FOTO 4/100; 09/04/17 4/100 no change due to severity of injury/surgery and continued restrictions on use/movement    Status  Revised    Target Date  11/10/17      PT LONG TERM GOAL #2   Title  Patient will improve FOTO score to 40/100 demonstrating improvement with functional use left UE for daily tasks    Baseline  FOTO 4/100    Status  Revised    Target Date  11/10/17      PT LONG TERM GOAL #3   Title  patient will be independent with home exercises for flexibility and strength to allow  transition to self management once discharged from physical therapy    Baseline  requires assistance for ROM left shoulder, guidance and cuing to perform all exercises    Status  On-going    Target Date  11/10/17            Plan - 10/09/17 1430    Clinical Impression Statement  Patient demonstrates steady progress towards goals and is slowly progressing with AROM and strength which is allowing her to improve functional use left UE. She continues with significant weakness and limited ROM and use of left UE due to recent injury/surgery and will require continued physical therapy intervention in order to further improve ROM and strength  so she can raise left UE above shoulder level for functional tasks at home/work.     Rehab Potential  Good    Clinical Impairments Affecting Rehab Potential  (+) motivated, acute condition (-)obesity, DDD, lumbar     PT Frequency  2x / week    PT Duration  6 weeks    PT Treatment/Interventions  Manual techniques;Neuromuscular re-education;Patient/family education;Cryotherapy;Electrical Stimulation;Moist Heat;Therapeutic exercise;Therapeutic activities;Balance training;Scar mobilization;Passive range of motion    PT Next Visit Plan  pain control, progressive exercises PROM/AAROM shoulder and AROM elbow to hand to improve control/strength left UE    PT Home Exercise Plan  AROM left elbow, wrist, hand, pendulum exercise for shoulder       Patient will benefit from skilled therapeutic intervention in order to improve the following deficits and impairments:  Pain, Impaired UE functional use, Impaired perceived functional ability, Decreased strength, Decreased range of motion, Decreased endurance, Decreased activity tolerance, Decreased balance, Obesity  Visit Diagnosis: Acute pain of left shoulder  Muscle weakness (generalized)  Stiffness of left shoulder, not elsewhere classified     Problem List Patient Active Problem List   Diagnosis Date Noted  .  DVT (deep venous thrombosis) ([place]) 08/29/2017  . Leukocytosis 08/06/2017  . Humerus fracture 08/01/2017  . CPAP (continuous positive airway pressure) dependence 08/01/2017  . Asthma 08/01/2017  . HTN (hypertension) 06/24/2017  . Depression 11/07/2015  . Systolic murmur 89/16/9450  . Morbid obesity ([place]) 10/26/2014  . Insomnia 06/21/2014  . Anxiety 06/21/2014  . Spinal stenosis at L4-L5 level 06/02/2014  . POLYCYSTIC OVARIAN DISEASE 06/19/2010    Jomarie Longs PT 10/10/2017, 9:16 AM  [facility] [address], [place], [address] Phone: [phone]   Fax:  [phone]  Name: KAJAH SANTIZO MRN: [id number] Date of Birth: 07-22-1970

## 2017-10-22 ENCOUNTER — Ambulatory Visit: Payer: BLUE CROSS/BLUE SHIELD | Admitting: Physical Therapy

## 2017-10-27 ENCOUNTER — Encounter: Payer: Self-pay | Admitting: Physical Therapy

## 2017-10-27 ENCOUNTER — Ambulatory Visit: Payer: BLUE CROSS/BLUE SHIELD | Admitting: Physical Therapy

## 2017-10-27 DIAGNOSIS — M25512 Pain in left shoulder: Secondary | ICD-10-CM | POA: Diagnosis not present

## 2017-10-27 DIAGNOSIS — M25612 Stiffness of left shoulder, not elsewhere classified: Secondary | ICD-10-CM

## 2017-10-27 DIAGNOSIS — M6281 Muscle weakness (generalized): Secondary | ICD-10-CM

## 2017-10-27 NOTE — Therapy (Signed)
[facility] [address], [place], [address] Phone: [phone]   Fax:  [phone]  Physical Therapy Treatment  Patient Details  Name: Danielle Irwin MRN: [id number] Date of Birth: 12/16/1970 Referring Provider: Katha Hamming MD   Encounter Date: 10/27/2017  PT End of Session - 10/27/17 1516    Visit Number  17    Number of Visits  24    Date for PT Re-Evaluation  11/10/17    Authorization Type  4 of 6 FOTO    PT Start Time  1433    PT Stop Time  1524    PT Time Calculation (min)  51 min    Activity Tolerance  Patient tolerated treatment well;Patient limited by pain    Behavior During Therapy  [facility] for tasks assessed/performed       Past Medical History:  Diagnosis Date  . Asthma   . Depression   . Family history of adverse reaction to anesthesia    FATHER WAS SLOW TO WAKE UP  . Headache    OCCASIONAL MIGRAINES  . HNP (herniated nucleus pulposus), lumbar   . Hx of epistaxis   . Insomnia   . OA (osteoarthritis)   . Obesity   . PCOS (polycystic ovarian syndrome)   . Sensitive skin   . Vitamin D deficiency     Past Surgical History:  Procedure Laterality Date  . LUMBAR LAMINECTOMY/DECOMPRESSION MICRODISCECTOMY Right 06/02/2014   Procedure: MICRO LUMBAR DECOMPRESSION L4 - L5 ON THE RIGHT 1 LEVEL;  Surgeon: Johnn Hai, MD;  Location: [facility];  Service: Orthopedics;  Laterality: Right;  . ORIF HUMERUS FRACTURE Left 08/01/2017   Procedure: OPEN REDUCTION INTERNAL FIXATION (ORIF) PROXIMAL HUMERUS FRACTURE;  Surgeon: Shona Needles, MD;  Location: [facility];  Service: Orthopedics;  Laterality: Left;  . WISDOM TOOTH EXTRACTION      There were no vitals filed for this visit.  Subjective Assessment - 10/27/17 1433    Subjective  Patient reports she is getting over a somach virus. Her left arm is weak and painful. She feels she will need additional therapy to be able to use left arm normally again. She  also cannot place left arm at side close to trunk due to increased swelling in upper arm.     Pertinent History  Patient reports she fell into a storm door at neighbors after stubbing toe on step 07/30/17. She felt immediate pain and numbness left UE and she could not move her left UE. She was transported to ED and then transported to [facility] in [place] and underwent surgery with ORIF 08/01/17 and released to home 08/03/17.     Limitations  Lifting;Walking;House hold activities out of work; personal care; staying at parent;s due to limitations    Patient Stated Goals  to be able to use her arm again for personal care, return to all ADL's with minimal difficutly    Currently in Pain?  Yes    Pain Score  5     Pain Location  Shoulder    Pain Orientation  Left    Pain Descriptors / Indicators  Aching;Sore    Pain Type  Acute pain;Surgical pain 08/01/2017    Pain Onset  More than a month ago    Pain Frequency  Intermittent       Objective: Palpation: point tender along left upper trapezius and left pectoral muscle AROM: left shoulder forward elevation sitting: 0-45 with effort and pain  end range AAROM: left shoulder forward elevation supine: 0- ~90 degrees with pain limiting further motion; ER 0-30 degrees with pain limiting further motion   Treatment:  Manual therapy: 10 min.  STM superficial techniques to improve soft tissue elasticity; STM upper trapezius and left pectoralis muscle with patient supine superficial techniques, compression techniques to left upper trapezius    Therapeutic exercise: patient performed with assistance, verbal and tactile cues of therapist:  Supiine: AAROM left shoulder 5 reps 3 sets: up to 90+ degrees  flexion; pain limits further motion; ER/IR AAROM within pain free motion ~ 30 degrees PROM 2 x 5 reps with hold end range with pain increased at 90+ degrees   Sitting:  Seated UE ranger for forward flexion and rotations 2 sets 1 min each with reported fatigue  and pain in left shoulder limiting further motion/repetitions   Modalities: Electrical stimulation: 20 AVW:[id number] stim. 10/10 cycle applied (2) electrodes to left shoulder periscapular muscles rhomboids/lower intensity to tolerance and high volt estim to left upper trapezius and pectoral muscles, intensity to tolerance with patient reclined with left UE supported on pillow: muscle re education/pain: no adverse reactions noted  moist heat pack (unbilled time), in conjunction with estim.: applied to left shoulder/upper trapezius and pectoral muscles for pain control; no adverse reactions noted   Patient response to treatment: patient demonstrated improved technique with exercises following estim/moist heat with minimal VC for correct alignment. Patient with decreased pain from 5/10 to 4/10 at end of session and increased pain up to 8/10 with elevation of left shoulder >90 degrees. Decreased tenderness and pain by at least 25% following estim/moist heat.      PT Education - 10/27/17 1647    Education provided  Yes    Education Details  exercise instruction for ROM, keeping left shoulder in alignment, educated in iontophoresis as possible addition to therapy POC for pain/inflammation    Person(s) Educated  Patient    Methods  Explanation;Demonstration;Verbal cues    Comprehension  Verbalized understanding;Returned demonstration;Verbal cues required          PT Long Term Goals - 09/29/17 1549      PT LONG TERM GOAL #1   Title  Patient will improve FOTO score to 25/100 demonstrating improvement with functional use left UE for daily tasks    Baseline  FOTO 4/100; 09/04/17 4/100 no change due to severity of injury/surgery and continued restrictions on use/movement    Status  Revised    Target Date  11/10/17      PT LONG TERM GOAL #2   Title  Patient will improve FOTO score to 40/100 demonstrating improvement with functional use left UE for daily tasks    Baseline  FOTO 4/100    Status   Revised    Target Date  11/10/17      PT LONG TERM GOAL #3   Title  patient will be independent with home exercises for flexibility and strength to allow transition to self management once discharged from physical therapy    Baseline  requires assistance for ROM left shoulder, guidance and cuing to perform all exercises    Status  On-going    Target Date  11/10/17            Plan - 10/27/17 1516    Clinical Impression Statement  Patient continues with pain and weakness as primary limiting factors for functional use left UE. She is progressing slowly towards goals due to severity of pain. She may benefit from iontophoresis  with dexamethasone to assist with inflammation and pain in order to be able to progress ROM above shoulder level.     Rehab Potential  Good    Clinical Impairments Affecting Rehab Potential  (+) motivated, acute condition (-)obesity, DDD, lumbar     PT Frequency  2x / week    PT Duration  6 weeks    PT Treatment/Interventions  Manual techniques;Neuromuscular re-education;Patient/family education;Cryotherapy;Electrical Stimulation;Moist Heat;Therapeutic exercise;Therapeutic activities;Balance training;Scar mobilization;Passive range of motion;Iontophoresis 4mg /ml Dexamethasone    PT Next Visit Plan  pain control, progressive exercises PROM/AAROM shoulder and AROM elbow to hand to improve control/strength left UE    PT Home Exercise Plan  AROM left elbow, wrist, hand, pendulum exercise for shoulder       Patient will benefit from skilled therapeutic intervention in order to improve the following deficits and impairments:  Pain, Impaired UE functional use, Impaired perceived functional ability, Decreased strength, Decreased range of motion, Decreased endurance, Decreased activity tolerance, Decreased balance, Obesity  Visit Diagnosis: Acute pain of left shoulder  Muscle weakness (generalized)  Stiffness of left shoulder, not elsewhere classified     Problem  List Patient Active Problem List   Diagnosis Date Noted  . DVT (deep venous thrombosis) ([place]) 08/29/2017  . Leukocytosis 08/06/2017  . Humerus fracture 08/01/2017  . CPAP (continuous positive airway pressure) dependence 08/01/2017  . Asthma 08/01/2017  . HTN (hypertension) 06/24/2017  . Depression 11/07/2015  . Systolic murmur 62/37/6283  . Morbid obesity ([place]) 10/26/2014  . Insomnia 06/21/2014  . Anxiety 06/21/2014  . Spinal stenosis at L4-L5 level 06/02/2014  . POLYCYSTIC OVARIAN DISEASE 06/19/2010    Jomarie Longs PT 10/28/2017, 11:01 AM  [facility] [address], [place], [address] Phone: [phone]   Fax:  [phone]  Name: Danielle Irwin MRN: [id number] Date of Birth: 1970-08-13

## 2017-10-28 ENCOUNTER — Other Ambulatory Visit: Payer: Self-pay | Admitting: Family Medicine

## 2017-10-29 ENCOUNTER — Other Ambulatory Visit: Payer: Self-pay

## 2017-10-29 ENCOUNTER — Ambulatory Visit: Payer: BLUE CROSS/BLUE SHIELD | Admitting: Physical Therapy

## 2017-10-29 MED ORDER — SERTRALINE HCL 100 MG PO TABS: 100.0000 mg | ORAL_TABLET | Freq: Every day | ORAL | 3 refills | Status: DC

## 2017-10-30 ENCOUNTER — Ambulatory Visit: Payer: BLUE CROSS/BLUE SHIELD | Admitting: Physical Therapy

## 2017-10-30 DIAGNOSIS — M25512 Pain in left shoulder: Secondary | ICD-10-CM | POA: Diagnosis not present

## 2017-10-30 DIAGNOSIS — M6281 Muscle weakness (generalized): Secondary | ICD-10-CM

## 2017-10-30 DIAGNOSIS — M25612 Stiffness of left shoulder, not elsewhere classified: Secondary | ICD-10-CM

## 2017-10-30 NOTE — Therapy (Signed)
[facility] [address] [place], [address] Phone: [phone]   Fax:  [phone]  Physical Therapy Treatment  Patient Details  Name: Danielle Irwin MRN: [id number] Date of Birth: Mar 11, 1971 Referring Provider: Katha Hamming MD   Encounter Date: 10/30/2017  PT End of Session - 10/30/17 1521    Visit Number  18    Number of Visits  24    Date for PT Re-Evaluation  11/10/17    Authorization Type  5 of 6 FOTO    PT Start Time  1435    PT Stop Time  1522    PT Time Calculation (min)  47 min    Activity Tolerance  Patient tolerated treatment well;Patient limited by pain    Behavior During Therapy  [facility] for tasks assessed/performed       Past Medical History:  Diagnosis Date  . Asthma   . Depression   . Family history of adverse reaction to anesthesia    FATHER WAS SLOW TO WAKE UP  . Headache    OCCASIONAL MIGRAINES  . HNP (herniated nucleus pulposus), lumbar   . Hx of epistaxis   . Insomnia   . OA (osteoarthritis)   . Obesity   . PCOS (polycystic ovarian syndrome)   . Sensitive skin   . Vitamin D deficiency     Past Surgical History:  Procedure Laterality Date  . LUMBAR LAMINECTOMY/DECOMPRESSION MICRODISCECTOMY Right 06/02/2014   Procedure: MICRO LUMBAR DECOMPRESSION L4 - L5 ON THE RIGHT 1 LEVEL;  Surgeon: Johnn Hai, MD;  Location: [facility];  Service: Orthopedics;  Laterality: Right;  . ORIF HUMERUS FRACTURE Left 08/01/2017   Procedure: OPEN REDUCTION INTERNAL FIXATION (ORIF) PROXIMAL HUMERUS FRACTURE;  Surgeon: Shona Needles, MD;  Location: [place];  Service: Orthopedics;  Laterality: Left;  . WISDOM TOOTH EXTRACTION      There were no vitals filed for this visit.  Subjective Assessment - 10/30/17 1436    Subjective  Patient reports she continues with pain in left shoulder that is preventing her from being able to use and raise arm without difficulty. She reports being re assessed by MD and  had an X ray to see progress and determine if everything looked good because she is feeling popping in front of shoulder with movement. she has new order for iontophoresis with dexamethasone and consents this addition to POC/treatment.    Pertinent History  Patient reports she fell into a storm door at neighbors after stubbing toe on step 07/30/17. She felt immediate pain and numbness left UE and she could not move her left UE. She was transported to ED and then transported to [facility] in [place] and underwent surgery with ORIF 08/01/17 and released to home 08/03/17.     Limitations  Lifting;Walking;House hold activities out of work; personal care; staying at parent;s due to limitations    Patient Stated Goals  to be able to use her arm again for personal care, return to all ADL's with minimal difficutly    Currently in Pain?  Yes    Pain Score  7     Pain Location  Shoulder    Pain Orientation  Left    Pain Descriptors / Indicators  Aching;Sore    Pain Type  Acute pain;Surgical pain 08/01/2017    Pain Onset  More than a month ago    Pain Frequency  Intermittent         Objective: New order  received to add iontophoresis to treatment Palpation: point tender along left upper trapezius and left pectoral muscle AAROM: left shoulder forward elevation supine: 0- ~90 degrees with pain limiting further motion; ER 0-30 degrees with pain limiting further motion pre treatment   Treatment:  Manual therapy:86min.  STM superficial techniques to improve soft tissue elasticity; STM to left upper trapezius withpatient supinesuperficial techniques, compression techniques to left upper trapezius  Therapeutic exercise:patient performed with assistance, verbal and tactile cues of therapist: Supiine: AAROM left shoulder5reps3sets: up to90+degreesflexion; pain limits further motion; ER/IR AAROM within pain free motion mid ranges PROM 2 x 5 reps with hold end range with pain increased at 100+  degrees  Modalities: Electrical stimulation:29min:Russian stim. 10/10 cycle applied (2) electrodes toleft shoulder periscapular muscles rhomboids/lowerintensity to toleranceand high volt estim to left upper trapezius and pectoral muscles, intensity to tolerancewith patient reclined with left UEsupported on pillow:muscle re education/pain: no adverse reactions noted  Iontophoresis with dexamethasone 4mg /ml @ 40 ma*min applied large electrode to anterior aspect left shoulder over tender area (10 min application) followed by there ex: no adverse reaction noted: goal: inflammation/pain  Patient response to treatment: improved ability to tolerate exercises following iontophoresis with decreased pain from 7/10 to 5/10.        PT Education - 10/30/17 1438    Education provided  Yes    Education Details  instructed in ionophoresis treatment, possible reactions, indications with patient consenting to treatment.     Person(s) Educated  Patient    Methods  Explanation    Comprehension  Verbalized understanding          PT Long Term Goals - 09/29/17 1549      PT LONG TERM GOAL #1   Title  Patient will improve FOTO score to 25/100 demonstrating improvement with functional use left UE for daily tasks    Baseline  FOTO 4/100; 09/04/17 4/100 no change due to severity of injury/surgery and continued restrictions on use/movement    Status  Revised    Target Date  11/10/17      PT LONG TERM GOAL #2   Title  Patient will improve FOTO score to 40/100 demonstrating improvement with functional use left UE for daily tasks    Baseline  FOTO 4/100    Status  Revised    Target Date  11/10/17      PT LONG TERM GOAL #3   Title  patient will be independent with home exercises for flexibility and strength to allow transition to self management once discharged from physical therapy    Baseline  requires assistance for ROM left shoulder, guidance and cuing to perform all exercises    Status   On-going    Target Date  11/10/17            Plan - 10/30/17 1533    Clinical Impression Statement  Patient tolerated iontophoresis treatment well without adverse reactions. she was able to perform exercises with much less discomfort and improved ROM to above 90 degrees with mild discomfort following iontophoresis treatment. She will benefit from continued physical therapy intervention to achieve maximal functional return for left UE.     Rehab Potential  Good    Clinical Impairments Affecting Rehab Potential  (+) motivated, acute condition (-)obesity, DDD, lumbar     PT Frequency  2x / week    PT Duration  6 weeks    PT Treatment/Interventions  Manual techniques;Neuromuscular re-education;Patient/family education;Cryotherapy;Electrical Stimulation;Moist Heat;Therapeutic exercise;Therapeutic activities;Balance training;Scar mobilization;Passive range of motion;Iontophoresis 4mg /ml Dexamethasone  PT Next Visit Plan  pain control, progressive exercises PROM/AAROM shoulder and AROM elbow to hand to improve control/strength left UE; iontophoresis 4mg /ml Dexamethasone    PT Home Exercise Plan  AROM left elbow, wrist, hand, pendulum exercise for shoulder       Patient will benefit from skilled therapeutic intervention in order to improve the following deficits and impairments:  Pain, Impaired UE functional use, Impaired perceived functional ability, Decreased strength, Decreased range of motion, Decreased endurance, Decreased activity tolerance, Decreased balance, Obesity  Visit Diagnosis: Acute pain of left shoulder  Muscle weakness (generalized)  Stiffness of left shoulder, not elsewhere classified     Problem List Patient Active Problem List   Diagnosis Date Noted  . DVT (deep venous thrombosis) ([place]) 08/29/2017  . Leukocytosis 08/06/2017  . Humerus fracture 08/01/2017  . CPAP (continuous positive airway pressure) dependence 08/01/2017  . Asthma 08/01/2017  . HTN  (hypertension) 06/24/2017  . Depression 11/07/2015  . Systolic murmur 04/88/8916  . Morbid obesity ([place]) 10/26/2014  . Insomnia 06/21/2014  . Anxiety 06/21/2014  . Spinal stenosis at L4-L5 level 06/02/2014  . POLYCYSTIC OVARIAN DISEASE 06/19/2010    Jomarie Longs PT 10/30/2017, 10:47 PM  [facility] [address] [place], [address] Phone: [phone]   Fax:  [phone]  Name: Danielle Irwin MRN: [id number] Date of Birth: 11-10-1970

## 2017-10-30 NOTE — Telephone Encounter (Signed)
Alreay erx'ed in/thx dmf

## 2017-11-04 ENCOUNTER — Ambulatory Visit: Payer: BLUE CROSS/BLUE SHIELD | Admitting: Physical Therapy

## 2017-11-04 ENCOUNTER — Encounter: Payer: Self-pay | Admitting: Physical Therapy

## 2017-11-04 DIAGNOSIS — M6281 Muscle weakness (generalized): Secondary | ICD-10-CM

## 2017-11-04 DIAGNOSIS — M25612 Stiffness of left shoulder, not elsewhere classified: Secondary | ICD-10-CM

## 2017-11-04 DIAGNOSIS — M25512 Pain in left shoulder: Secondary | ICD-10-CM | POA: Diagnosis not present

## 2017-11-04 DIAGNOSIS — M62838 Other muscle spasm: Secondary | ICD-10-CM

## 2017-11-05 NOTE — Therapy (Signed)
[facility] [address], [place], [address] Phone: [phone]   Fax:  [phone]  Physical Therapy Treatment  Patient Details  Name: Danielle Irwin MRN: [id number] Date of Birth: Jul 07, 1970 Referring Provider: Katha Hamming MD   Encounter Date: 11/04/2017  PT End of Session - 11/04/17 1849    Visit Number  19    Number of Visits  24    Date for PT Re-Evaluation  11/10/17    Authorization Type  6 of 6 FOTO    PT Start Time  1517    PT Stop Time  1602    PT Time Calculation (min)  45 min    Activity Tolerance  Patient tolerated treatment well;Patient limited by pain    Behavior During Therapy  [facility] for tasks assessed/performed       Past Medical History:  Diagnosis Date  . Asthma   . Depression   . Family history of adverse reaction to anesthesia    FATHER WAS SLOW TO WAKE UP  . Headache    OCCASIONAL MIGRAINES  . HNP (herniated nucleus pulposus), lumbar   . Hx of epistaxis   . Insomnia   . OA (osteoarthritis)   . Obesity   . PCOS (polycystic ovarian syndrome)   . Sensitive skin   . Vitamin D deficiency     Past Surgical History:  Procedure Laterality Date  . LUMBAR LAMINECTOMY/DECOMPRESSION MICRODISCECTOMY Right 06/02/2014   Procedure: MICRO LUMBAR DECOMPRESSION L4 - L5 ON THE RIGHT 1 LEVEL;  Surgeon: Johnn Hai, MD;  [facility]: [facility];  Service: Orthopedics;  Laterality: Right;  . ORIF HUMERUS FRACTURE Left 08/01/2017   Procedure: OPEN REDUCTION INTERNAL FIXATION (ORIF) PROXIMAL HUMERUS FRACTURE;  Surgeon: Shona Needles, MD;  Location: [facility];  Service: Orthopedics;  Laterality: Left;  . WISDOM TOOTH EXTRACTION      There were no vitals filed for this visit.  Subjective Assessment - 11/04/17 1521    Subjective  Patient reports she is trying to move left arm more and is still very weak and cannot use arm for dressing, showering, lifting.     Pertinent History  Patient reports she fell  into a storm door at neighbors after stubbing toe on step 07/30/17. She felt immediate pain and numbness left UE and she could not move her left UE. She was transported to ED and then transported to [facility] in [place] and underwent surgery with ORIF 08/01/17 and released to home 08/03/17.     Limitations  Lifting;Walking;House hold activities out of work; personal care; staying at parent;s due to limitations    Patient Stated Goals  to be able to use her arm again for personal care, return to all ADL's with minimal difficutly    Currently in Pain?  Yes    Pain Score  5     Pain Location  Shoulder    Pain Orientation  Left    Pain Descriptors / Indicators  Aching    Pain Type  Surgical pain;Acute pain 08/01/2017    Pain Onset  More than a month ago    Pain Frequency  Intermittent       Objective: Palpation: point tender along left upper trapezius and along lateral aspect upper arm  AAROM: left shoulder forward elevation supine: 0- ~90 degrees with pain limiting further motion; ER 0-30 degrees with pain limiting further motion   Treatment:  Manual therapy:64min.  STM superficial techniques to improve soft  tissue elasticity; STM to left upper trapezius withpatient supinesuperficial techniques, compression techniques to left upper trapezius Stretching to upper trapezius 3 x 30 seconds patient supine lying  Therapeutic exercise:patient performed with assistance, verbal and tactile cues of therapist: Supiine: AAROM left shoulder5reps3sets: up to90+degreesflexion; pain limits further motion; ER/IR AAROM within pain free motion mid ranges  Modalities: Electrical stimulation:30min:Russian stim. 10/10 cycle applied (2) electrodes toleft shoulder periscapular muscles rhomboids/lowerintensity to toleranceand high volt estim to left upper trapezius and pectoral muscles, intensity to tolerancewith patient reclined with left UEsupported on pillow:muscle re education/pain: no  adverse reactions noted  Iontophoresis with dexamethasone 4mg /ml @ 40 ma*min applied large electrode to lateral aspect left shoulder/upper arm over tender area (8 min application) followed by there ex: no adverse reaction noted: goal: inflammation/pain  Patient response to treatment: Improved AAROM left shoulder to 90+ degrees with mild pain; pain decreased from 5/10 to 3/10 with treatment, decreased spasms left upper trapezius following STM and upper trapezius stretching.     PT Education - 11/04/17 1534    Education provided  Yes    Education Details  exercise instruction, iontophoresis instruction    Person(s) Educated  Patient    Methods  Explanation    Comprehension  Verbalized understanding          PT Long Term Goals - 09/29/17 1549      PT LONG TERM GOAL #1   Title  Patient will improve FOTO score to 25/100 demonstrating improvement with functional use left UE for daily tasks    Baseline  FOTO 4/100; 09/04/17 4/100 no change due to severity of injury/surgery and continued restrictions on use/movement    Status  Revised    Target Date  11/10/17      PT LONG TERM GOAL #2   Title  Patient will improve FOTO score to 40/100 demonstrating improvement with functional use left UE for daily tasks    Baseline  FOTO 4/100    Status  Revised    Target Date  11/10/17      PT LONG TERM GOAL #3   Title  patient will be independent with home exercises for flexibility and strength to allow transition to self management once discharged from physical therapy    Baseline  requires assistance for ROM left shoulder, guidance and cuing to perform all exercises    Status  On-going    Target Date  11/10/17            Plan - 11/04/17 1606    Clinical Impression Statement  Patient demonstrated good carry over between sessions with decreased pain right shoulder anteriorly and no adverse reactions to iontophoresis treatment. She was able to improve AAROM to 90 degrees with less  difficulty following treatment today and should continue to progress with additional physical therapy intervention to achieve maximal functional use left UE.    Rehab Potential  Good    Clinical Impairments Affecting Rehab Potential  (+) motivated, acute condition (-)obesity, DDD, lumbar     PT Frequency  2x / week    PT Duration  6 weeks    PT Treatment/Interventions  Manual techniques;Neuromuscular re-education;Patient/family education;Cryotherapy;Electrical Stimulation;Moist Heat;Therapeutic exercise;Therapeutic activities;Balance training;Scar mobilization;Passive range of motion;Iontophoresis 4mg /ml Dexamethasone    PT Next Visit Plan  pain control, progressive exercises PROM/AAROM shoulder and AROM elbow to hand to improve control/strength left UE; iontophoresis 4mg /ml Dexamethasone    PT Home Exercise Plan  AROM left elbow, wrist, hand, pendulum exercise for shoulder  Patient will benefit from skilled therapeutic intervention in order to improve the following deficits and impairments:  Pain, Impaired UE functional use, Impaired perceived functional ability, Decreased strength, Decreased range of motion, Decreased endurance, Decreased activity tolerance, Decreased balance, Obesity  Visit Diagnosis: Acute pain of left shoulder  Muscle weakness (generalized)  Stiffness of left shoulder, not elsewhere classified  Other muscle spasm     Problem List Patient Active Problem List   Diagnosis Date Noted  . DVT (deep venous thrombosis) ([place]) 08/29/2017  . Leukocytosis 08/06/2017  . Humerus fracture 08/01/2017  . CPAP (continuous positive airway pressure) dependence 08/01/2017  . Asthma 08/01/2017  . HTN (hypertension) 06/24/2017  . Depression 11/07/2015  . Systolic murmur 62/86/3817  . Morbid obesity ([place]) 10/26/2014  . Insomnia 06/21/2014  . Anxiety 06/21/2014  . Spinal stenosis at L4-L5 level 06/02/2014  . POLYCYSTIC OVARIAN DISEASE 06/19/2010    Jomarie Longs  PT 11/05/2017, 9:30 AM  [facility] [address] [place], [address] Phone: [phone]   Fax:  [phone]  Name: LUDIE HUDON MRN: [id number] Date of Birth: 04-07-1971

## 2017-11-06 ENCOUNTER — Ambulatory Visit: Payer: BLUE CROSS/BLUE SHIELD | Admitting: Physical Therapy

## 2017-11-06 ENCOUNTER — Encounter: Payer: Self-pay | Admitting: Physical Therapy

## 2017-11-06 DIAGNOSIS — M25512 Pain in left shoulder: Secondary | ICD-10-CM | POA: Diagnosis not present

## 2017-11-06 DIAGNOSIS — M6281 Muscle weakness (generalized): Secondary | ICD-10-CM

## 2017-11-06 DIAGNOSIS — M25612 Stiffness of left shoulder, not elsewhere classified: Secondary | ICD-10-CM

## 2017-11-06 DIAGNOSIS — M62838 Other muscle spasm: Secondary | ICD-10-CM

## 2017-11-07 NOTE — Therapy (Signed)
[facility] [address], [place], [address] Phone: [phone]   Fax:  [phone]  Physical Therapy Treatment  Patient Details  Name: Danielle Irwin MRN: [id number] Date of Birth: [birth year] Referring Provider: Katha Hamming MD   Encounter Date: 11/06/2017  PT End of Session - 11/06/17 0954    Visit Number  20    Number of Visits  24    Date for PT Re-Evaluation  11/10/17    Authorization Type  7 of 6 FOTO    PT Start Time  0904    PT Stop Time  0950    PT Time Calculation (min)  46 min    Activity Tolerance  Patient tolerated treatment well;Patient limited by pain    Behavior During Therapy  [facility] for tasks assessed/performed       Past Medical History:  Diagnosis Date  . Asthma   . Depression   . Family history of adverse reaction to anesthesia    FATHER WAS SLOW TO WAKE UP  . Headache    OCCASIONAL MIGRAINES  . HNP (herniated nucleus pulposus), lumbar   . Hx of epistaxis   . Insomnia   . OA (osteoarthritis)   . Obesity   . PCOS (polycystic ovarian syndrome)   . Sensitive skin   . Vitamin D deficiency     Past Surgical History:  Procedure Laterality Date  . LUMBAR LAMINECTOMY/DECOMPRESSION MICRODISCECTOMY Right 06/02/2014   Procedure: MICRO LUMBAR DECOMPRESSION L4 - L5 ON THE RIGHT 1 LEVEL;  Surgeon: Johnn Hai, MD;  [facility];  Service: Orthopedics;  Laterality: Right;  . ORIF HUMERUS FRACTURE Left 08/01/2017   Procedure: OPEN REDUCTION INTERNAL FIXATION (ORIF) PROXIMAL HUMERUS FRACTURE;  Surgeon: Shona Needles, MD;  Location: [facility];  Service: Orthopedics;  Laterality: Left;  . WISDOM TOOTH EXTRACTION      There were no vitals filed for this visit.  Subjective Assessment - 11/06/17 0908    Subjective  Patient reports she continues with pain in left UE and reports no concerns following iontophoresis treatment.  She reports increased pain in left upper arm today and continues  with pain with exercise ROM above shoulder level. She still cannot perform personal care using left UE and is limited in all ADLs    Pertinent History  Patient reports she fell into a storm door at neighbors after stubbing toe on step 07/30/17. She felt immediate pain and numbness left UE and she could not move her left UE. She was transported to ED and then transported to [facility] in [place] and underwent surgery with ORIF 08/01/17 and released to home 08/03/17.     Limitations  Lifting;Walking;House hold activities out of work; personal care; staying at parent;s due to limitations    Patient Stated Goals  to be able to use her arm again for personal care, return to all ADL's with minimal difficutly    Currently in Pain?  Yes    Pain Score  6     Pain Location  Shoulder    Pain Orientation  Left    Pain Descriptors / Indicators  Aching    Pain Type  Acute pain;Surgical pain 08/01/2017    Pain Onset  More than a month ago           Objective: Palpation: hypersensitive left UE superior shoulder and  along lateral aspect upper arm  AAROM: left shoulder forward elevation supine: 0- ~90 degrees with pain  limiting further motion; ER 0-30 degrees with pain limiting further motion AROM sitting: left UE shoulder forward elevation to ~60 degrees with pain and weakness limiting further motion    Treatment:   Therapeutic exercise: patient performed with assistance, verbal and tactile cues of therapist:  Supiine: AAROM left shoulder 5 reps 2 sets: up to 90+ degrees  flexion; pain limits further motion; ER/IR AAROM within pain free motion mid ranges Isometric hold at 90 degrees x 10 seconds, 3 reps with facilitation of therapist; moderate fatigue noted     Modalities: Electrical stimulation: 20 ALP:FXTKWIO stim. 10/10 cycle applied (2) electrodes to left shoulder periscapular muscles rhomboids/lower intensity to tolerance with patient reclined with left UE supported on pillow: muscle re  education/pain: no adverse reactions noted   Iontophoresis with dexamethasone 4mg /ml @ 40 ma*min applied large electrode to lateral aspect left shoulder/upper arm and superior aspect of shoulder over tender area (10 min application) followed by there ex: no adverse reaction noted: goal: inflammation/pain   Patient response to treatment: improved ability to tolerate AAROM exercise following iontophoresis. limited ROM due to pain increased above 90 degrees elevation. imrpoved moror control with repetition. pain level improved to 3-4/10 following treatment.       PT Education - 11/06/17 0915    Education provided  Yes    Education Details  Exercise instruction for home: AAROM and holding left UE at 90 degrees for strengthening (supine)    Person(s) Educated  Patient    Methods  Explanation;Demonstration;Verbal cues    Comprehension  Verbalized understanding;Returned demonstration;Verbal cues required          PT Long Term Goals - 09/29/17 1549      PT LONG TERM GOAL #1   Title  Patient will improve FOTO score to 25/100 demonstrating improvement with functional use left UE for daily tasks    Baseline  FOTO 4/100; 09/04/17 4/100 no change due to severity of injury/surgery and continued restrictions on use/movement    Status  Revised    Target Date  11/10/17      PT LONG TERM GOAL #2   Title  Patient will improve FOTO score to 40/100 demonstrating improvement with functional use left UE for daily tasks    Baseline  FOTO 4/100    Status  Revised    Target Date  11/10/17      PT LONG TERM GOAL #3   Title  patient will be independent with home exercises for flexibility and strength to allow transition to self management once discharged from physical therapy    Baseline  requires assistance for ROM left shoulder, guidance and cuing to perform all exercises    Status  On-going    Target Date  11/10/17            Plan - 11/06/17 1000    Clinical Impression Statement  Patient  demonstrates slow and steady progress with ROM left UE and is responding favorably to iontophoresis for pain control to allow her to exercise. She continues with significant decreased ROM and strength left UE s/p fx/surgery left UE which limits all ADLs  and will require continued physical therapy intervention for pain, ROM and strength in order to return to maximal functional use left UE for work and home/personal care activities.     Rehab Potential  Good    Clinical Impairments Affecting Rehab Potential  (+) motivated, acute condition (-)obesity, DDD, lumbar     PT Frequency  2x / week    PT  Duration  6 weeks    PT Treatment/Interventions  Manual techniques;Neuromuscular re-education;Patient/family education;Cryotherapy;Electrical Stimulation;Moist Heat;Therapeutic exercise;Therapeutic activities;Balance training;Scar mobilization;Passive range of motion;Iontophoresis 4mg /ml Dexamethasone    PT Next Visit Plan  pain control, progressive exercises PROM/AAROM shoulder and AROM elbow to hand to improve control/strength left UE; iontophoresis 4mg /ml Dexamethasone    PT Home Exercise Plan  AROM left elbow, wrist, hand, pendulum exercise for shoulder       Patient will benefit from skilled therapeutic intervention in order to improve the following deficits and impairments:  Pain, Impaired UE functional use, Impaired perceived functional ability, Decreased strength, Decreased range of motion, Decreased endurance, Decreased activity tolerance, Decreased balance, Obesity  Visit Diagnosis: Acute pain of left shoulder  Muscle weakness (generalized)  Stiffness of left shoulder, not elsewhere classified  Other muscle spasm     Problem List Patient Active Problem List   Diagnosis Date Noted  . DVT (deep venous thrombosis) ([place]) 08/29/2017  . Leukocytosis 08/06/2017  . Humerus fracture 08/01/2017  . CPAP (continuous positive airway pressure) dependence 08/01/2017  . Asthma 08/01/2017  . HTN  (hypertension) 06/24/2017  . Depression 11/07/2015  . Systolic murmur 70/26/3785  . Morbid obesity ([place]) 10/26/2014  . Insomnia 06/21/2014  . Anxiety 06/21/2014  . Spinal stenosis at L4-L5 level 06/02/2014  . POLYCYSTIC OVARIAN DISEASE 06/19/2010    Jomarie Longs PT 11/07/2017, 8:43 AM  [facility] [address], [place], [address] Phone: [phone]   Fax:  [phone]  Name: Danielle Irwin MRN: [id number] Date of Birth: 11-14-1970

## 2017-11-10 ENCOUNTER — Encounter: Payer: Self-pay | Admitting: Physical Therapy

## 2017-11-10 ENCOUNTER — Ambulatory Visit: Payer: BLUE CROSS/BLUE SHIELD | Attending: Student | Admitting: Physical Therapy

## 2017-11-10 DIAGNOSIS — M62838 Other muscle spasm: Secondary | ICD-10-CM | POA: Diagnosis present

## 2017-11-10 DIAGNOSIS — M25612 Stiffness of left shoulder, not elsewhere classified: Secondary | ICD-10-CM | POA: Diagnosis present

## 2017-11-10 DIAGNOSIS — M6281 Muscle weakness (generalized): Secondary | ICD-10-CM

## 2017-11-10 DIAGNOSIS — M25512 Pain in left shoulder: Secondary | ICD-10-CM

## 2017-11-10 NOTE — Therapy (Signed)
[facility] [address] [place], [address] Phone: [phone]   Fax:  [phone]  Physical Therapy Treatment  Patient Details  Name: Danielle Irwin MRN: [id number] Date of Birth: 03-29-1971 Referring Provider: Katha Hamming MD   Encounter Date: 11/10/2017  PT End of Session - 11/10/17 1106    Visit Number  21    Number of Visits  36    Date for PT Re-Evaluation  11/10/17    Authorization Type  8 of 6 FOTO    PT Start Time  1101    PT Stop Time  1148    PT Time Calculation (min)  47 min    Activity Tolerance  Patient tolerated treatment well;Patient limited by pain    Behavior During Therapy  [facility] for tasks assessed/performed       Past Medical History:  Diagnosis Date  . Asthma   . Depression   . Family history of adverse reaction to anesthesia    FATHER WAS SLOW TO WAKE UP  . Headache    OCCASIONAL MIGRAINES  . HNP (herniated nucleus pulposus), lumbar   . Hx of epistaxis   . Insomnia   . OA (osteoarthritis)   . Obesity   . PCOS (polycystic ovarian syndrome)   . Sensitive skin   . Vitamin D deficiency     Past Surgical History:  Procedure Laterality Date  . LUMBAR LAMINECTOMY/DECOMPRESSION MICRODISCECTOMY Right 06/02/2014   Procedure: MICRO LUMBAR DECOMPRESSION L4 - L5 ON THE RIGHT 1 LEVEL;  Surgeon: Johnn Hai, MD;  [facility];  Service: Orthopedics;  Laterality: Right;  . ORIF HUMERUS FRACTURE Left 08/01/2017   Procedure: OPEN REDUCTION INTERNAL FIXATION (ORIF) PROXIMAL HUMERUS FRACTURE;  Surgeon: Shona Needles, MD;  Location: [facility];  Service: Orthopedics;  Laterality: Left;  . WISDOM TOOTH EXTRACTION      There were no vitals filed for this visit.  Subjective Assessment - 11/10/17 1105    Subjective  Patient reports she continues with pain in left UE and has had more pain over the weekend     Pertinent History  Patient reports she fell into a storm door at neighbors after  stubbing toe on step 07/30/17. She felt immediate pain and numbness left UE and she could not move her left UE. She was transported to ED and then transported to [facility] in [place] and underwent surgery with ORIF 08/01/17 and released to home 08/03/17.     Limitations  Lifting;Walking;House hold activities out of work; personal care; staying at parent;s due to limitations    Patient Stated Goals  to be able to use her arm again for personal care, return to all ADL's with minimal difficutly    Currently in Pain?  Yes    Pain Score  6     Pain Location  Shoulder    Pain Orientation  Left    Pain Descriptors / Indicators  Aching;Tightness;Sore    Pain Type  Acute pain;Surgical pain 08/01/2017    Pain Onset  More than a month ago    Pain Frequency  Intermittent         Objective: Palpation: hypersensitive left UE superior shoulder and  along lateral aspect upper arm AAROM: left shoulder forward elevation supine: 0- -100 degrees with pain limiting further motion; ER 0-40 degrees with pain limiting further motion AROM sitting: 75 FF, 60 abduction with hiking of shoulder FOTO 26/100 (pain is primary limiting factor)  Treatment:  Therapeutic exercise:patient performed with assistance, verbal and tactile cues of therapist: Supiine: AAROM left shoulder10 reps2sets: up to90+degreesflexion; pain limits further motion; ER/IR AAROM within pain free motion mid ranges; 0-40 degrees AAROM short arc forward elevation 2 x 10 reps Isometric hold at 90 degrees x 10 seconds, 2 reps with facilitation of therapist; moderate fatigue noted   Modalities: Electrical stimulation:8min:Russian stim. 10/10 cycle applied (2) electrodes toleft shoulder periscapular muscles rhomboids/lowerintensity to tolerancewith patient reclined with left UEsupported on pillow:muscle re education/pain: no adverse reactions noted  Iontophoresis with dexamethasone 4mg /ml @ 40 ma*min applied large electrode  tolateralaspect left shoulder/upper armand anterior aspect of shoulder over tender area (81OFB application) followed by there ex: no adverse reaction noted: goal: inflammation/pain  Patient response to treatment:improved ability to perform exercises with less difficulty following iontophoresis treatment with ROM up to 125 degrees forward elevation and 40 degrees ER. Pain is primary limitation to further motion; pain level decreased from 6/10 to 4/10 with treatment.     PT Education - 11/10/17 1200    Education provided  Yes    Education Details  exercise insruction short arc motion and continue with isometric hold 90 degrees elevation supine    Person(s) Educated  Patient    Methods  Explanation;Demonstration;Verbal cues    Comprehension  Verbalized understanding;Returned demonstration;Verbal cues required          PT Long Term Goals - 09/29/17 1549      PT LONG TERM GOAL #1   Title  Patient will improve FOTO score to 25/100 demonstrating improvement with functional use left UE for daily tasks    Baseline  FOTO 4/100; 09/04/17 4/100 no change due to severity of injury/surgery and continued restrictions on use/movement    Status  Revised    Target Date  11/10/17      PT LONG TERM GOAL #2   Title  Patient will improve FOTO score to 40/100 demonstrating improvement with functional use left UE for daily tasks    Baseline  FOTO 4/100    Status  Revised    Target Date  11/10/17      PT LONG TERM GOAL #3   Title  patient will be independent with home exercises for flexibility and strength to allow transition to self management once discharged from physical therapy    Baseline  requires assistance for ROM left shoulder, guidance and cuing to perform all exercises    Status  On-going    Target Date  11/10/17            Plan - 11/10/17 1200    Clinical Impression Statement  Patient continues pain as primary limting factor to improving function with daily activties using left  UE. Sheis responding well to current therapy interventions and should continue to improve with additional physical therapy.     Rehab Potential  Good    Clinical Impairments Affecting Rehab Potential  (+) motivated, acute condition (-)obesity, DDD, lumbar     PT Frequency  2x / week    PT Duration  6 weeks    PT Treatment/Interventions  Manual techniques;Neuromuscular re-education;Patient/family education;Cryotherapy;Electrical Stimulation;Moist Heat;Therapeutic exercise;Therapeutic activities;Balance training;Scar mobilization;Passive range of motion;Iontophoresis 4mg /ml Dexamethasone    PT Next Visit Plan  pain control, progressive exercises PROM/AAROM shoulder and AROM elbow to hand to improve control/strength left UE; iontophoresis 4mg /ml Dexamethasone    PT Home Exercise Plan  AROM left elbow, wrist, hand, pendulum exercise for shoulder; AAROM left shoulder supine, isometric hold at 90 degrees elevation  90 degrees       Patient will benefit from skilled therapeutic intervention in order to improve the following deficits and impairments:  Pain, Impaired UE functional use, Impaired perceived functional ability, Decreased strength, Decreased range of motion, Decreased endurance, Decreased activity tolerance, Decreased balance, Obesity  Visit Diagnosis: Stiffness of left shoulder, not elsewhere classified  Muscle weakness (generalized)  Acute pain of left shoulder     Problem List Patient Active Problem List   Diagnosis Date Noted  . DVT (deep venous thrombosis) ([place]) 08/29/2017  . Leukocytosis 08/06/2017  . Humerus fracture 08/01/2017  . CPAP (continuous positive airway pressure) dependence 08/01/2017  . Asthma 08/01/2017  . HTN (hypertension) 06/24/2017  . Depression 11/07/2015  . Systolic murmur 54/36/0677  . Morbid obesity ([place]) 10/26/2014  . Insomnia 06/21/2014  . Anxiety 06/21/2014  . Spinal stenosis at L4-L5 level 06/02/2014  . POLYCYSTIC OVARIAN DISEASE 06/19/2010     Jomarie Longs PT 11/10/2017, 3:09 PM  [facility] [address] [place], [address] Phone: [phone]   Fax:  [phone]  Name: JONET MATHIES MRN: [id number] Date of Birth: [birth year]

## 2017-11-11 ENCOUNTER — Ambulatory Visit: Payer: BLUE CROSS/BLUE SHIELD | Admitting: Physical Therapy

## 2017-11-11 ENCOUNTER — Ambulatory Visit (INDEPENDENT_AMBULATORY_CARE_PROVIDER_SITE_OTHER): Payer: BLUE CROSS/BLUE SHIELD | Admitting: Vascular Surgery

## 2017-11-11 ENCOUNTER — Encounter (INDEPENDENT_AMBULATORY_CARE_PROVIDER_SITE_OTHER): Payer: BLUE CROSS/BLUE SHIELD

## 2017-11-11 DIAGNOSIS — M25612 Stiffness of left shoulder, not elsewhere classified: Secondary | ICD-10-CM

## 2017-11-11 DIAGNOSIS — M25512 Pain in left shoulder: Secondary | ICD-10-CM

## 2017-11-12 NOTE — Therapy (Addendum)
[facility] [address], [place], [address] Phone: [phone]   Fax:  [phone]  Physical Therapy Treatment  Patient Details  Name: Danielle Irwin MRN: [id number] Date of Birth: 21-Jan-1971 Referring Provider: Katha Hamming MD   Encounter Date: 11/11/2017  PT End of Session - 11/11/17 1600    Visit Number  22    Number of Visits  36    Date for PT Re-Evaluation  11/10/17    Authorization Type  9 of 6 FOTO    PT Start Time  1133    PT Stop Time  1203    PT Time Calculation (min)  30 min    Activity Tolerance  Patient tolerated treatment well;Patient limited by pain    Behavior During Therapy  [facility] for tasks assessed/performed       Past Medical History:  Diagnosis Date  . Asthma   . Depression   . Family history of adverse reaction to anesthesia    FATHER WAS SLOW TO WAKE UP  . Headache    OCCASIONAL MIGRAINES  . HNP (herniated nucleus pulposus), lumbar   . Hx of epistaxis   . Insomnia   . OA (osteoarthritis)   . Obesity   . PCOS (polycystic ovarian syndrome)   . Sensitive skin   . Vitamin D deficiency     Past Surgical History:  Procedure Laterality Date  . LUMBAR LAMINECTOMY/DECOMPRESSION MICRODISCECTOMY Right 06/02/2014   Procedure: MICRO LUMBAR DECOMPRESSION L4 - L5 ON THE RIGHT 1 LEVEL;  Surgeon: Johnn Hai, MD;  Location: [facility];  Service: Orthopedics;  Laterality: Right;  . ORIF HUMERUS FRACTURE Left 08/01/2017   Procedure: OPEN REDUCTION INTERNAL FIXATION (ORIF) PROXIMAL HUMERUS FRACTURE;  Surgeon: Shona Needles, MD;  Location: [place];  Service: Orthopedics;  Laterality: Left;  . WISDOM TOOTH EXTRACTION      There were no vitals filed for this visit.  Subjective Assessment - 11/11/17 1200    Subjective  Patient reports she is noticing some improvement with iontophoresis treatment with less sorenessin left shoulder and upper arm.    Pertinent History  Patient reports she fell  into a storm door at neighbors after stubbing toe on step 07/30/17. She felt immediate pain and numbness left UE and she could not move her left UE. She was transported to ED and then transported to [facility] in [place] and underwent surgery with ORIF 08/01/17 and released to home 08/03/17.     Limitations  Lifting;Walking;House hold activities out of work; personal care; staying at parent;s due to limitations    Patient Stated Goals  to be able to use her arm again for personal care, return to all ADL's with minimal difficutly    Currently in Pain?  Yes    Pain Score  6     Pain Location  Shoulder    Pain Orientation  Left    Pain Descriptors / Indicators  Aching;Sore    Pain Type  Acute pain;Surgical pain 09/08/2017    Pain Onset  More than a month ago    Pain Frequency  Intermittent       Treatment:  Modalities:   Iontophoresis with dexamethasone 4mg /ml @ 40 ma*min applied (1) large electrode tolateralaspect left shoulder/upper arm and (1) to anterior aspect of shoulder over tender area (66QHU application) : no adverse reaction noted: goal: inflammation/pain  Patient response to treatment:no adverse effect; pain level decreased from 6/10 to 4/10 per patient report  PT Long Term Goals - 11/11/17 1700      PT LONG TERM GOAL #1   Title  Patient will improve FOTO score to 25/100 demonstrating improvement with functional use left UE for daily tasks    Baseline  FOTO 4/100; 09/04/17 4/100 no change due to severity of injury/surgery and continued restrictions on use/movement    Status  Revised    Target Date  12/23/17      PT LONG TERM GOAL #2   Title  Patient will improve FOTO score to 40/100 demonstrating improvement with functional use left UE for daily tasks    Baseline  FOTO 4/100    Status  Revised    Target Date  12/23/17      PT LONG TERM GOAL #3   Title  patient will be independent with home exercises for flexibility and strength to allow transition to self  management once discharged from physical therapy    Baseline  requires assistance for ROM left shoulder, guidance and cuing to perform all exercises    Status  On-going    Target Date  12/23/17            Plan - 11/11/17 1946    Clinical Impression Statement  Patient is responding favorably to treatment with iontophoresis for pain, inflammation. She should continue to progress with additional treatment.  Iontophoresis treatment only today to allow enough sessions before she goes out of town to get best results.    Rehab Potential  Good    Clinical Impairments Affecting Rehab Potential  (+) motivated, acute condition (-)obesity, DDD, lumbar     PT Frequency  2x / week    PT Duration  6 weeks    PT Treatment/Interventions  Manual techniques;Neuromuscular re-education;Patient/family education;Cryotherapy;Electrical Stimulation;Moist Heat;Therapeutic exercise;Therapeutic activities;Balance training;Scar mobilization;Passive range of motion;Iontophoresis 4mg /ml Dexamethasone    PT Next Visit Plan  pain control, progressive exercises PROM/AAROM shoulder and AROM elbow to hand to improve control/strength left UE; iontophoresis 4mg /ml Dexamethasone    PT Home Exercise Plan  AROM left elbow, wrist, hand, pendulum exercise for shoulder; AAROM left shoulder supine, isometric hold at 90 degrees elevation 90 degrees       Patient will benefit from skilled therapeutic intervention in order to improve the following deficits and impairments:  Pain, Impaired UE functional use, Impaired perceived functional ability, Decreased strength, Decreased range of motion, Decreased endurance, Decreased activity tolerance, Decreased balance, Obesity  Visit Diagnosis: Stiffness of left shoulder, not elsewhere classified - Plan: PT plan of care cert/re-cert  Acute pain of left shoulder - Plan: PT plan of care cert/re-cert     Problem List Patient Active Problem List   Diagnosis Date Noted  . DVT (deep venous  thrombosis) ([place]) 08/29/2017  . Leukocytosis 08/06/2017  . Humerus fracture 08/01/2017  . CPAP (continuous positive airway pressure) dependence 08/01/2017  . Asthma 08/01/2017  . HTN (hypertension) 06/24/2017  . Depression 11/07/2015  . Systolic murmur 40/97/3532  . Morbid obesity ([place]) 10/26/2014  . Insomnia 06/21/2014  . Anxiety 06/21/2014  . Spinal stenosis at L4-L5 level 06/02/2014  . POLYCYSTIC OVARIAN DISEASE 06/19/2010    Jomarie Longs PT 11/12/2017, 7:49 PM  [facility] [address], [place], [address] Phone: [phone]   Fax:  [phone]  Name: JANAISA BIRKLAND MRN: [id number] Date of Birth: 1970/12/12

## 2017-11-13 ENCOUNTER — Encounter (INDEPENDENT_AMBULATORY_CARE_PROVIDER_SITE_OTHER): Payer: Self-pay

## 2017-11-13 ENCOUNTER — Ambulatory Visit: Payer: BLUE CROSS/BLUE SHIELD | Admitting: Physical Therapy

## 2017-11-17 ENCOUNTER — Encounter: Payer: Self-pay | Admitting: Physical Therapy

## 2017-11-17 ENCOUNTER — Ambulatory Visit: Payer: BLUE CROSS/BLUE SHIELD | Admitting: Physical Therapy

## 2017-11-17 DIAGNOSIS — M25612 Stiffness of left shoulder, not elsewhere classified: Secondary | ICD-10-CM | POA: Diagnosis not present

## 2017-11-17 DIAGNOSIS — M62838 Other muscle spasm: Secondary | ICD-10-CM

## 2017-11-17 DIAGNOSIS — M6281 Muscle weakness (generalized): Secondary | ICD-10-CM

## 2017-11-17 DIAGNOSIS — M25512 Pain in left shoulder: Secondary | ICD-10-CM

## 2017-11-17 NOTE — Therapy (Signed)
[facility] [address], [place], [address] Phone: [phone]   Fax:  [phone]  Physical Therapy Treatment  Patient Details  Name: Danielle Irwin MRN: [id number] Date of Birth: [birth year] Referring Provider: Katha Hamming MD   Encounter Date: 11/17/2017  PT End of Session - 11/17/17 1507    Visit Number  23    Number of Visits  36    Date for PT Re-Evaluation  11/10/17    Authorization Type  1 of 6 FOTO    PT Start Time  1503    PT Stop Time  1550    PT Time Calculation (min)  47 min    Activity Tolerance  Patient tolerated treatment well;Patient limited by pain    Behavior During Therapy  [facility] for tasks assessed/performed       Past Medical History:  Diagnosis Date  . Asthma   . Depression   . Family history of adverse reaction to anesthesia    FATHER WAS SLOW TO WAKE UP  . Headache    OCCASIONAL MIGRAINES  . HNP (herniated nucleus pulposus), lumbar   . Hx of epistaxis   . Insomnia   . OA (osteoarthritis)   . Obesity   . PCOS (polycystic ovarian syndrome)   . Sensitive skin   . Vitamin D deficiency     Past Surgical History:  Procedure Laterality Date  . LUMBAR LAMINECTOMY/DECOMPRESSION MICRODISCECTOMY Right 06/02/2014   Procedure: MICRO LUMBAR DECOMPRESSION L4 - L5 ON THE RIGHT 1 LEVEL;  Surgeon: Johnn Hai, MD;  Location: [facility];  Service: Orthopedics;  Laterality: Right;  . ORIF HUMERUS FRACTURE Left 08/01/2017   Procedure: OPEN REDUCTION INTERNAL FIXATION (ORIF) PROXIMAL HUMERUS FRACTURE;  Surgeon: Shona Needles, MD;  Location: [place];  Service: Orthopedics;  Laterality: Left;  . WISDOM TOOTH EXTRACTION      There were no vitals filed for this visit.  Subjective Assessment - 11/17/17 1505    Subjective  patient is continuing to see improvement with iontophoresis treatment with improvement noted with daily activities using left UE.     Pertinent History  Patient reports she  fell into a storm door at neighbors after stubbing toe on step 07/30/17. She felt immediate pain and numbness left UE and she could not move her left UE. She was transported to ED and then transported to [facility] in [place] and underwent surgery with ORIF 08/01/17 and released to home 08/03/17.     Limitations  Lifting;Walking;House hold activities out of work; personal care; staying at parent;s due to limitations    Patient Stated Goals  to be able to use her arm again for personal care, return to all ADL's with minimal difficutly    Currently in Pain?  Yes    Pain Score  3     Pain Location  Shoulder    Pain Orientation  Left    Pain Descriptors / Indicators  Aching;Tightness    Pain Type  Acute pain;Surgical pain 08/01/2017    Pain Onset  More than a month ago    Pain Frequency  Intermittent          Objective: Palpation: hypersensitive left UE superior shoulder and along lateral aspect upper arm AAROM: left shoulder forward elevation supine: 0 - 120  degrees with pain limiting further motion; ER 0-40 degrees with pain limiting further motion FOTO 26/100 (pain is primary limiting factor)  Treatment:  Therapeutic exercise:patient performed with assistance,  verbal and tactile cues of therapist: Supiine: AAROM left shoulder10 reps2sets: up to 120 degreesflexion; pain limits further motion; ER/IR AAROM within pain free motion mid ranges; 0-40 degrees Isometric hold at 90 degrees x 10 seconds, 2 reps with facilitation of therapist; moderate fatigue noted  Modalities: Electrical stimulation:54min:Russian stim. 10/10 cycle applied (2) electrodes toleft shoulder periscapular muscles rhomboids/lowerintensity to tolerancewith patient reclined with left UEsupported on pillow:muscle re education/pain: no adverse reactions noted  Iontophoresis with dexamethasone 4mg /ml @ 40 ma*min applied large electrode tolateralaspect left shoulder/upper armand anterior aspect of  shoulder over tender area (30ZSW application) followed by there ex: no adverse reaction noted: goal: inflammation/pain  Patient response to treatment:improved ability to perform exercises with less difficulty following iontophoresis treatment with ROM up to 125 degrees forward elevation and 40 degrees ER.     PT Education - 11/17/17 1510    Education provided  Yes    Education Details  exercise instruction for technique    Person(s) Educated  Patient    Methods  Explanation;Demonstration;Verbal cues    Comprehension  Verbalized understanding;Returned demonstration;Verbal cues required          PT Long Term Goals - 11/11/17 1700      PT LONG TERM GOAL #1   Title  Patient will improve FOTO score to 25/100 demonstrating improvement with functional use left UE for daily tasks    Baseline  FOTO 4/100; 09/04/17 4/100 no change due to severity of injury/surgery and continued restrictions on use/movement    Status  Revised    Target Date  12/23/17      PT LONG TERM GOAL #2   Title  Patient will improve FOTO score to 40/100 demonstrating improvement with functional use left UE for daily tasks    Baseline  FOTO 4/100    Status  Revised    Target Date  12/23/17      PT LONG TERM GOAL #3   Title  patient will be independent with home exercises for flexibility and strength to allow transition to self management once discharged from physical therapy    Baseline  requires assistance for ROM left shoulder, guidance and cuing to perform all exercises    Status  On-going    Target Date  12/23/17            Plan - 11/17/17 1800    Clinical Impression Statement  Patient demonstrates slow and steady progress with ROM left UE and is responding favorably to iontophoresis for pain control to allow her to exercise. She continues with significant decreased ROM and strength left UE s/p fx/surgery which limits function with ADLs. She will require continued physical therapy intervention for  reduction of pain, to improve ROM and strength left UE in order to return to maximal functional use left UE for work and home/personal careactivities.    Rehab Potential  Good    Clinical Impairments Affecting Rehab Potential  (+) motivated, acute condition (-)obesity, DDD, lumbar     PT Frequency  2x / week    PT Duration  6 weeks    PT Treatment/Interventions  Manual techniques;Neuromuscular re-education;Patient/family education;Cryotherapy;Electrical Stimulation;Moist Heat;Therapeutic exercise;Therapeutic activities;Balance training;Scar mobilization;Passive range of motion;Iontophoresis 4mg /ml Dexamethasone    PT Next Visit Plan  pain control, progressive exercises PROM/AAROM shoulder and AROM elbow to hand to improve control/strength left UE; iontophoresis 4mg /ml Dexamethasone    PT Home Exercise Plan  AROM left elbow, wrist, hand, pendulum exercise for shoulder; AAROM left shoulder supine, isometric hold at 90 degrees elevation  90 degrees       Patient will benefit from skilled therapeutic intervention in order to improve the following deficits and impairments:  Pain, Impaired UE functional use, Impaired perceived functional ability, Decreased strength, Decreased range of motion, Decreased endurance, Decreased activity tolerance, Decreased balance, Obesity  Visit Diagnosis: Stiffness of left shoulder, not elsewhere classified  Acute pain of left shoulder  Muscle weakness (generalized)  Other muscle spasm     Problem List Patient Active Problem List   Diagnosis Date Noted  . DVT (deep venous thrombosis) ([place]) 08/29/2017  . Leukocytosis 08/06/2017  . Humerus fracture 08/01/2017  . CPAP (continuous positive airway pressure) dependence 08/01/2017  . Asthma 08/01/2017  . HTN (hypertension) 06/24/2017  . Depression 11/07/2015  . Systolic murmur 34/19/3790  . Morbid obesity ([place]) 10/26/2014  . Insomnia 06/21/2014  . Anxiety 06/21/2014  . Spinal stenosis at L4-L5 level  06/02/2014  . POLYCYSTIC OVARIAN DISEASE 06/19/2010    Jomarie Longs PT 11/17/2017, 11:24 PM  [facility] [address] [place], [address] Phone: [phone]   Fax:  [phone]  Name: Danielle Irwin MRN: [id number] Date of Birth: 06/29/1970

## 2017-11-17 NOTE — Addendum Note (Signed)
Addended by: Aldona Lento on: 11/17/2017 11:20 PM   Modules accepted: Orders

## 2017-11-18 ENCOUNTER — Encounter (INDEPENDENT_AMBULATORY_CARE_PROVIDER_SITE_OTHER): Payer: BLUE CROSS/BLUE SHIELD

## 2017-11-18 ENCOUNTER — Ambulatory Visit: Payer: BLUE CROSS/BLUE SHIELD | Admitting: Physical Therapy

## 2017-11-18 ENCOUNTER — Other Ambulatory Visit (INDEPENDENT_AMBULATORY_CARE_PROVIDER_SITE_OTHER): Payer: Self-pay

## 2017-11-18 ENCOUNTER — Ambulatory Visit (INDEPENDENT_AMBULATORY_CARE_PROVIDER_SITE_OTHER): Payer: BLUE CROSS/BLUE SHIELD | Admitting: Vascular Surgery

## 2017-11-18 DIAGNOSIS — I82431 Acute embolism and thrombosis of right popliteal vein: Secondary | ICD-10-CM

## 2017-11-19 ENCOUNTER — Ambulatory Visit: Payer: BLUE CROSS/BLUE SHIELD | Admitting: Physical Therapy

## 2017-11-19 DIAGNOSIS — M25612 Stiffness of left shoulder, not elsewhere classified: Secondary | ICD-10-CM

## 2017-11-19 DIAGNOSIS — M25512 Pain in left shoulder: Secondary | ICD-10-CM

## 2017-11-19 NOTE — Therapy (Signed)
[facility] [address] [place], [address] Phone: [phone]   Fax:  [phone]  Physical Therapy Treatment  Patient Details  Name: Danielle Irwin MRN: [id number] Date of Birth: [birth year] Referring Provider: Katha Hamming MD   Encounter Date: 11/19/2017  PT End of Session - 11/19/17 1700    Visit Number  24    Number of Visits  36    Date for PT Re-Evaluation  12/23/17    Authorization Type  1 of 6 FOTO    PT Start Time  1432    PT Stop Time  1510    PT Time Calculation (min)  38 min    Activity Tolerance  Patient tolerated treatment well;Patient limited by pain    Behavior During Therapy  [facility] for tasks assessed/performed       Past Medical History:  Diagnosis Date  . Asthma   . Depression   . Family history of adverse reaction to anesthesia    FATHER WAS SLOW TO WAKE UP  . Headache    OCCASIONAL MIGRAINES  . HNP (herniated nucleus pulposus), lumbar   . Hx of epistaxis   . Insomnia   . OA (osteoarthritis)   . Obesity   . PCOS (polycystic ovarian syndrome)   . Sensitive skin   . Vitamin D deficiency     Past Surgical History:  Procedure Laterality Date  . LUMBAR LAMINECTOMY/DECOMPRESSION MICRODISCECTOMY Right 06/02/2014   Procedure: MICRO LUMBAR DECOMPRESSION L4 - L5 ON THE RIGHT 1 LEVEL;  Surgeon: Johnn Hai, MD;  Location: [facility];  Service: Orthopedics;  Laterality: Right;  . ORIF HUMERUS FRACTURE Left 08/01/2017   Procedure: OPEN REDUCTION INTERNAL FIXATION (ORIF) PROXIMAL HUMERUS FRACTURE;  Surgeon: Shona Needles, MD;  Location: [place];  Service: Orthopedics;  Laterality: Left;  . WISDOM TOOTH EXTRACTION      There were no vitals filed for this visit.  Subjective Assessment - 11/19/17 1434    Subjective  Patient reports she is progressing slowly with ability ot raise left UE forward and out to side with less difficulty. She is still very weak and has pain in upper arm and front  of shoulder.     Pertinent History  Patient reports she fell into a storm door at neighbors after stubbing toe on step 07/30/17. She felt immediate pain and numbness left UE and she could not move her left UE. She was transported to ED and then transported to [facility] in [place] and underwent surgery with ORIF 08/01/17 and released to home 08/03/17.     Limitations  Lifting;Walking;House hold activities out of work; personal care; staying at parent;s due to limitations    Patient Stated Goals  to be able to use her arm again for personal care, return to all ADL's with minimal difficutly    Currently in Pain?  Yes    Pain Score  3     Pain Location  Shoulder    Pain Orientation  Left    Pain Descriptors / Indicators  Aching;Sore    Pain Type  Acute pain;Surgical pain 08/01/2017    Pain Onset  More than a month ago    Pain Frequency  Intermittent        Treatment:  Modalities:  Iontophoresis with dexamethasone 4mg /ml @ 40 ma*min applied (1) large electrode tolateralaspect left shoulder/upper arm and (1) toanterioraspect of shoulder over tender area (69GEX application) : no adverse reaction noted: goal: inflammation/pain  Patient response to treatment:no adverse effect; pain level decreased from 3/10 to 1-2/10 per patient report              PT Education - 11/19/17 1500    Education provided  Yes    Education Details  HEP    Person(s) Educated  Patient    Methods  Explanation    Comprehension  Verbalized understanding          PT Long Term Goals - 11/11/17 1700      PT LONG TERM GOAL #1   Title  Patient will improve FOTO score to 25/100 demonstrating improvement with functional use left UE for daily tasks    Baseline  FOTO 4/100; 09/04/17 4/100 no change due to severity of injury/surgery and continued restrictions on use/movement    Status  Revised    Target Date  12/23/17      PT LONG TERM GOAL #2   Title  Patient will improve FOTO score to 40/100  demonstrating improvement with functional use left UE for daily tasks    Baseline  FOTO 4/100    Status  Revised    Target Date  12/23/17      PT LONG TERM GOAL #3   Title  patient will be independent with home exercises for flexibility and strength to allow transition to self management once discharged from physical therapy    Baseline  requires assistance for ROM left shoulder, guidance and cuing to perform all exercises    Status  On-going    Target Date  12/23/17            Plan - 11/19/17 1530    Clinical Impression Statement  Patient demonstrates good progress with improving AROM left shoulder/UE and is responding well to iontophoresis for pain control.     Rehab Potential  Good    Clinical Impairments Affecting Rehab Potential  (+) motivated, acute condition (-)obesity, DDD, lumbar     PT Frequency  2x / week    PT Duration  6 weeks    PT Treatment/Interventions  Manual techniques;Neuromuscular re-education;Patient/family education;Cryotherapy;Electrical Stimulation;Moist Heat;Therapeutic exercise;Therapeutic activities;Balance training;Scar mobilization;Passive range of motion;Iontophoresis 4mg /ml Dexamethasone    PT Next Visit Plan  pain control, progressive exercises PROM/AAROM shoulder and AROM elbow to hand to improve control/strength left UE; iontophoresis 4mg /ml Dexamethasone    PT Home Exercise Plan  AROM left elbow, wrist, hand, pendulum exercise for shoulder; AAROM left shoulder supine, isometric hold at 90 degrees elevation 90 degrees       Patient will benefit from skilled therapeutic intervention in order to improve the following deficits and impairments:  Pain, Impaired UE functional use, Impaired perceived functional ability, Decreased strength, Decreased range of motion, Decreased endurance, Decreased activity tolerance, Decreased balance, Obesity  Visit Diagnosis: Stiffness of left shoulder, not elsewhere classified  Acute pain of left  shoulder     Problem List Patient Active Problem List   Diagnosis Date Noted  . DVT (deep venous thrombosis) ([place]) 08/29/2017  . Leukocytosis 08/06/2017  . Humerus fracture 08/01/2017  . CPAP (continuous positive airway pressure) dependence 08/01/2017  . Asthma 08/01/2017  . HTN (hypertension) 06/24/2017  . Depression 11/07/2015  . Systolic murmur 32/95/1884  . Morbid obesity ([place]) 10/26/2014  . Insomnia 06/21/2014  . Anxiety 06/21/2014  . Spinal stenosis at L4-L5 level 06/02/2014  . POLYCYSTIC OVARIAN DISEASE 06/19/2010    Jomarie Longs PT 11/19/2017, 10:50 PM  [facility] [address]  [place], [place], [address] Phone: [phone]   Fax:  [phone]  Name: Danielle Irwin MRN: [id number] Date of Birth: 22-Dec-1970

## 2017-11-20 ENCOUNTER — Encounter: Payer: Self-pay | Admitting: Physical Therapy

## 2017-11-20 ENCOUNTER — Ambulatory Visit: Payer: BLUE CROSS/BLUE SHIELD | Admitting: Physical Therapy

## 2017-11-20 DIAGNOSIS — M25512 Pain in left shoulder: Secondary | ICD-10-CM

## 2017-11-20 DIAGNOSIS — M25612 Stiffness of left shoulder, not elsewhere classified: Secondary | ICD-10-CM

## 2017-11-20 DIAGNOSIS — M62838 Other muscle spasm: Secondary | ICD-10-CM

## 2017-11-20 DIAGNOSIS — M6281 Muscle weakness (generalized): Secondary | ICD-10-CM

## 2017-11-21 NOTE — Therapy (Signed)
[facility] [address] [place], [address] Phone: [phone]   Fax:  [phone]  Physical Therapy Treatment  Patient Details  Name: Danielle Irwin MRN: [id number] Date of Birth: 1970/12/29 Referring Provider: Katha Hamming MD   Encounter Date: 11/20/2017  PT End of Session - 11/20/17 1048    Visit Number  25    Number of Visits  32    Date for PT Re-Evaluation  12/23/17    Authorization Type  3 of 6 FOTO    PT Start Time  1038    PT Stop Time  1130    PT Time Calculation (min)  52 min    Activity Tolerance  Patient tolerated treatment well;Patient limited by pain    Behavior During Therapy  [facility] for tasks assessed/performed       Past Medical History:  Diagnosis Date  . Asthma   . Depression   . Family history of adverse reaction to anesthesia    FATHER WAS SLOW TO WAKE UP  . Headache    OCCASIONAL MIGRAINES  . HNP (herniated nucleus pulposus), lumbar   . Hx of epistaxis   . Insomnia   . OA (osteoarthritis)   . Obesity   . PCOS (polycystic ovarian syndrome)   . Sensitive skin   . Vitamin D deficiency     Past Surgical History:  Procedure Laterality Date  . LUMBAR LAMINECTOMY/DECOMPRESSION MICRODISCECTOMY Right 06/02/2014   Procedure: MICRO LUMBAR DECOMPRESSION L4 - L5 ON THE RIGHT 1 LEVEL;  Surgeon: Johnn Hai, MD;  [facility];  Service: Orthopedics;  Laterality: Right;  . ORIF HUMERUS FRACTURE Left 08/01/2017   Procedure: OPEN REDUCTION INTERNAL FIXATION (ORIF) PROXIMAL HUMERUS FRACTURE;  Surgeon: Shona Needles, MD;  Location: [place];  Service: Orthopedics;  Laterality: Left;  . WISDOM TOOTH EXTRACTION      There were no vitals filed for this visit.  Subjective Assessment - 11/20/17 1125    Subjective  Patient reports increased soreness anterior aspect and lateral aspect of left shoulder today. she contributes this partly due to rainy weather.    Pertinent History  Patient  reports she fell into a storm door at neighbors after stubbing toe on step 07/30/17. She felt immediate pain and numbness left UE and she could not move her left UE. She was transported to ED and then transported to [facility] in [place] and underwent surgery with ORIF 08/01/17 and released to home 08/03/17.     Limitations  Lifting;Walking;House hold activities out of work; personal care; staying at parent;s due to limitations    Patient Stated Goals  to be able to use her arm again for personal care, return to all ADL's with minimal difficutly    Currently in Pain?  Yes    Pain Score  6     Pain Location  Shoulder    Pain Orientation  Left    Pain Descriptors / Indicators  Aching;Sore    Pain Type  Acute pain;Surgical pain 08/01/2017    Pain Onset  More than a month ago    Pain Frequency  Intermittent        Objective: Palpation: hypersensitive left UE anterior and lateral upper arm/shoulder  AAROM: left shoulder forward elevation supine: 0-100 degrees with pain limiting further motion; ER 0-45 degrees with pain limiting further motion   Treatment:   Therapeutic exercise: patient performed with assistance, verbal and tactile cues of therapist:  Supiine: [place]  left shoulder 10 reps 2 sets: up to 90+ degrees  flexion; pain limits further motion; ER/IR AAROM within pain free motion mid ranges; 0-40 degrees Isometric hold at 90 degrees x 10 seconds, 2 reps with facilitation of therapist; moderate fatigue noted  UE ranger on floor/wedge forward flexion and rotations 2 x 15 reps each with therapist assistance/facilitation   Modalities: Electrical stimulation: 20 TRV:[id number] stim. 10/10 cycle applied (2) electrodes to left shoulder periscapular muscles rhomboids/lower intensity to tolerance with patient reclined with left UE supported on pillow: muscle re education/pain: no adverse reactions noted   Iontophoresis with dexamethasone 4mg /ml @ 40 ma*min applied large electrode to lateral aspect  left shoulder/upper arm and anterior aspect of shoulder over tender area (15 min application) following ther ex.: no adverse reaction noted: goal: inflammation/pain   Patient response to treatment: improved ROM with exercises with repetition and facilitation. decreased pain and tenderness left shoulder /upper arm following iontophoresis to 3-4/10          PT Education - 11/20/17 1131    Education provided  Yes    Education Details  exercise instruction left UE     Person(s) Educated  Patient    Methods  Explanation;Verbal cues    Comprehension  Verbalized understanding;Verbal cues required          PT Long Term Goals - 11/11/17 1700      PT LONG TERM GOAL #1   Title  Patient will improve FOTO score to 25/100 demonstrating improvement with functional use left UE for daily tasks    Baseline  FOTO 4/100; 09/04/17 4/100 no change due to severity of injury/surgery and continued restrictions on use/movement    Status  Revised    Target Date  12/23/17      PT LONG TERM GOAL #2   Title  Patient will improve FOTO score to 40/100 demonstrating improvement with functional use left UE for daily tasks    Baseline  FOTO 4/100    Status  Revised    Target Date  12/23/17      PT LONG TERM GOAL #3   Title  patient will be independent with home exercises for flexibility and strength to allow transition to self management once discharged from physical therapy    Baseline  requires assistance for ROM left shoulder, guidance and cuing to perform all exercises    Status  On-going    Target Date  12/23/17            Plan - 11/20/17 1133    Clinical Impression Statement  Patient continues steady progress with goals with improving functional use left UE and decreasing pain with current treatment interventions. she will require additional physical therapy treatment to achieve maximal functional return for left UE.     Rehab Potential  Good    Clinical Impairments Affecting Rehab Potential   (+) motivated, acute condition (-)obesity, DDD, lumbar     PT Frequency  2x / week    PT Duration  6 weeks    PT Treatment/Interventions  Manual techniques;Neuromuscular re-education;Patient/family education;Cryotherapy;Electrical Stimulation;Moist Heat;Therapeutic exercise;Therapeutic activities;Balance training;Scar mobilization;Passive range of motion;Iontophoresis 4mg /ml Dexamethasone    PT Next Visit Plan  pain control, progressive exercises PROM/AAROM shoulder and AROM elbow to hand to improve control/strength left UE; iontophoresis 4mg /ml Dexamethasone    PT Home Exercise Plan  AROM left elbow, wrist, hand, pendulum exercise for shoulder; AAROM left shoulder supine, isometric hold at 90 degrees elevation 90 degrees       Patient  will benefit from skilled therapeutic intervention in order to improve the following deficits and impairments:  Pain, Impaired UE functional use, Impaired perceived functional ability, Decreased strength, Decreased range of motion, Decreased endurance, Decreased activity tolerance, Decreased balance, Obesity  Visit Diagnosis: Stiffness of left shoulder, not elsewhere classified  Acute pain of left shoulder  Muscle weakness (generalized)  Other muscle spasm     Problem List Patient Active Problem List   Diagnosis Date Noted  . DVT (deep venous thrombosis) ([place]) 08/29/2017  . Leukocytosis 08/06/2017  . Humerus fracture 08/01/2017  . CPAP (continuous positive airway pressure) dependence 08/01/2017  . Asthma 08/01/2017  . HTN (hypertension) 06/24/2017  . Depression 11/07/2015  . Systolic murmur 38/33/3832  . Morbid obesity ([place]) 10/26/2014  . Insomnia 06/21/2014  . Anxiety 06/21/2014  . Spinal stenosis at L4-L5 level 06/02/2014  . POLYCYSTIC OVARIAN DISEASE 06/19/2010    Jomarie Longs PT 11/21/2017, 11:36 AM  [facility] [address] [place], [address] Phone: [phone]    Fax:  [phone]  Name: Danielle Irwin MRN: [id number] Date of Birth: 01-Nov-1970

## 2017-11-24 ENCOUNTER — Ambulatory Visit: Payer: BLUE CROSS/BLUE SHIELD | Admitting: Physical Therapy

## 2017-11-27 ENCOUNTER — Encounter: Payer: BLUE CROSS/BLUE SHIELD | Admitting: Physical Therapy

## 2017-12-01 ENCOUNTER — Ambulatory Visit: Payer: BLUE CROSS/BLUE SHIELD | Admitting: Physical Therapy

## 2017-12-02 ENCOUNTER — Ambulatory Visit: Payer: BLUE CROSS/BLUE SHIELD | Admitting: Physical Therapy

## 2017-12-02 ENCOUNTER — Encounter: Payer: Self-pay | Admitting: Physical Therapy

## 2017-12-02 DIAGNOSIS — M6281 Muscle weakness (generalized): Secondary | ICD-10-CM

## 2017-12-02 DIAGNOSIS — M25612 Stiffness of left shoulder, not elsewhere classified: Secondary | ICD-10-CM | POA: Diagnosis not present

## 2017-12-02 DIAGNOSIS — M25512 Pain in left shoulder: Secondary | ICD-10-CM

## 2017-12-02 DIAGNOSIS — M62838 Other muscle spasm: Secondary | ICD-10-CM

## 2017-12-02 NOTE — Therapy (Signed)
[facility] [facility] [address] [place], [address] Phone: [phone]   Fax:  [phone]  Physical Therapy Treatment  Patient Details  Name: Danielle Irwin MRN: [id number] Date of Birth: Apr 14, 1971 Referring Provider: Katha Hamming MD   Encounter Date: 12/02/2017  PT End of Session - 12/02/17 1042    Visit Number  26    Number of Visits  32    Date for PT Re-Evaluation  12/23/17    Authorization Type  4 of 6 FOTO    PT Start Time  1030    PT Stop Time  1117    PT Time Calculation (min)  47 min    Activity Tolerance  Patient tolerated treatment well;Patient limited by pain    Behavior During Therapy  [facility] for tasks assessed/performed       Past Medical History:  Diagnosis Date  . Asthma   . Depression   . Family history of adverse reaction to anesthesia    FATHER WAS SLOW TO WAKE UP  . Headache    OCCASIONAL MIGRAINES  . HNP (herniated nucleus pulposus), lumbar   . Hx of epistaxis   . Insomnia   . OA (osteoarthritis)   . Obesity   . PCOS (polycystic ovarian syndrome)   . Sensitive skin   . Vitamin D deficiency     Past Surgical History:  Procedure Laterality Date  . LUMBAR LAMINECTOMY/DECOMPRESSION MICRODISCECTOMY Right 06/02/2014   Procedure: MICRO LUMBAR DECOMPRESSION L4 - L5 ON THE RIGHT 1 LEVEL;  Surgeon: Johnn Hai, MD;  [facility];  Service: Orthopedics;  Laterality: Right;  . ORIF HUMERUS FRACTURE Left 08/01/2017   Procedure: OPEN REDUCTION INTERNAL FIXATION (ORIF) PROXIMAL HUMERUS FRACTURE;  Surgeon: Shona Needles, MD;  Location: [place];  Service: Orthopedics;  Laterality: Left;  . WISDOM TOOTH EXTRACTION      There were no vitals filed for this visit.  Subjective Assessment - 12/02/17 1039    Subjective  Patient reports she has increased pain in lower back today due to riding in car last week to [place] and back and then had to lay in bed most of her stay due to back pain. she  is still having pain and soreness in left shoulder/upper arm.     Pertinent History  Patient reports she fell into a storm door at neighbors after stubbing toe on step 07/30/17. She felt immediate pain and numbness left UE and she could not move her left UE. She was transported to ED and then transported to [facility] in [place] and underwent surgery with ORIF 08/01/17 and released to home 08/03/17.     Limitations  Lifting;Walking;House hold activities out of work; personal care; staying at parent;s due to limitations    Patient Stated Goals  to be able to use her arm again for personal care, return to all ADL's with minimal difficutly    Currently in Pain?  Yes    Pain Score  6     Pain Location  Shoulder    Pain Orientation  Left    Pain Descriptors / Indicators  Aching;Sore    Pain Type  Acute pain;Surgical pain 08/01/2017    Pain Onset  More than a month ago    Pain Frequency  Intermittent          Objective: Palpation: hypersensitive left UE anterior and lateral upper arm/shoulder  AAROM: left shoulder forward elevation supine: 0-140 degrees with pain limiting further  motion; ER 0-45 degrees with pain limiting further motion   Treatment:   Therapeutic exercise: patient performed with assistance, verbal and tactile cues of therapist:  Supiine: AAROM left shoulder 5 reps 3 sets: up to 120+ degrees  flexion; pain limits further motion; ER/IR AAROM within pain free motion mid ranges; 0-40 degrees Isometric hold at 90 degrees x 10 seconds, 2 reps with facilitation of therapist; moderate fatigue noted    Modalities: Electrical stimulation: 20 [id number]:[id number] stim. 10/10 cycle applied (2) electrodes to left shoulder periscapular muscles rhomboids/lower intensity to tolerance with patient reclined with left UE supported on pillow: muscle re education/pain: no adverse reactions noted   Iontophoresis with dexamethasone 4mg /ml @ 40 ma*min applied large electrode to lateral aspect left  shoulder/upper arm and anterior aspect of shoulder over tender area (12 min application) following ther ex.: no adverse reaction noted: goal: inflammation/pain   Patient response to treatment: Patient reported decreased pain in left shoulder by 50% following treatment. Improved ROM with repetition and facilitation of therapist     PT Education - 12/02/17 1042    Education provided  Yes    Education Details  exercise instruction left UE    Person(s) Educated  Patient    Methods  Explanation;Verbal cues    Comprehension  Verbalized understanding;Verbal cues required          PT Long Term Goals - 11/11/17 1700      PT LONG TERM GOAL #1   Title  Patient will improve FOTO score to 25/100 demonstrating improvement with functional use left UE for daily tasks    Baseline  FOTO 4/100; 09/04/17 4/100 no change due to severity of injury/surgery and continued restrictions on use/movement    Status  Revised    Target Date  12/23/17      PT LONG TERM GOAL #2   Title  Patient will improve FOTO score to 40/100 demonstrating improvement with functional use left UE for daily tasks    Baseline  FOTO 4/100    Status  Revised    Target Date  12/23/17      PT LONG TERM GOAL #3   Title  patient will be independent with home exercises for flexibility and strength to allow transition to self management once discharged from physical therapy    Baseline  requires assistance for ROM left shoulder, guidance and cuing to perform all exercises    Status  On-going    Target Date  12/23/17            Plan - 12/02/17 1043    Clinical Impression Statement  Patient continues steady progress with goals and continues with intermittent increased pain in left UE and continues with decreased functional use as she heals from injury/surgery.     Rehab Potential  Good    Clinical Impairments Affecting Rehab Potential  (+) motivated, acute condition (-)obesity, DDD, lumbar     PT Frequency  2x / week    PT  Duration  6 weeks    PT Treatment/Interventions  Manual techniques;Neuromuscular re-education;Patient/family education;Cryotherapy;Electrical Stimulation;Moist Heat;Therapeutic exercise;Therapeutic activities;Balance training;Scar mobilization;Passive range of motion;Iontophoresis 4mg /ml Dexamethasone    PT Next Visit Plan  pain control, progressive exercises PROM/AAROM shoulder and AROM elbow to hand to improve control/strength left UE; iontophoresis 4mg /ml Dexamethasone    PT Home Exercise Plan  AROM left elbow, wrist, hand, pendulum exercise for shoulder; AAROM left shoulder supine, isometric hold at 90 degrees elevation 90 degrees       Patient will benefit  from skilled therapeutic intervention in order to improve the following deficits and impairments:  Pain, Impaired UE functional use, Impaired perceived functional ability, Decreased strength, Decreased range of motion, Decreased endurance, Decreased activity tolerance, Decreased balance, Obesity  Visit Diagnosis: Stiffness of left shoulder, not elsewhere classified  Acute pain of left shoulder  Muscle weakness (generalized)  Other muscle spasm     Problem List Patient Active Problem List   Diagnosis Date Noted  . DVT (deep venous thrombosis) ([place]) 08/29/2017  . Leukocytosis 08/06/2017  . Humerus fracture 08/01/2017  . CPAP (continuous positive airway pressure) dependence 08/01/2017  . Asthma 08/01/2017  . HTN (hypertension) 06/24/2017  . Depression 11/07/2015  . Systolic murmur 23/34/3568  . Morbid obesity ([place]) 10/26/2014  . Insomnia 06/21/2014  . Anxiety 06/21/2014  . Spinal stenosis at L4-L5 level 06/02/2014  . POLYCYSTIC OVARIAN DISEASE 06/19/2010    Jomarie Longs PT 12/03/2017, 2:57 PM  [facility] [address] [place], [address] Phone: [phone]   Fax:  [phone]  Name: ELANDA GARMANY MRN: [id number] Date of Birth: 12-10-1970

## 2017-12-03 ENCOUNTER — Ambulatory Visit
Admission: RE | Admit: 2017-12-03 | Discharge: 2017-12-03 | Disposition: A | Discharge status: Home / Self Care | Payer: BLUE CROSS/BLUE SHIELD | Source: Ambulatory Visit | Attending: Vascular Surgery | Admitting: Vascular Surgery

## 2017-12-03 DIAGNOSIS — R590 Localized enlarged lymph nodes: Secondary | ICD-10-CM | POA: Diagnosis not present

## 2017-12-03 DIAGNOSIS — I82431 Acute embolism and thrombosis of right popliteal vein: Secondary | ICD-10-CM | POA: Diagnosis present

## 2017-12-04 ENCOUNTER — Ambulatory Visit: Payer: BLUE CROSS/BLUE SHIELD | Admitting: Physical Therapy

## 2017-12-04 ENCOUNTER — Encounter: Payer: Self-pay | Admitting: Physical Therapy

## 2017-12-04 DIAGNOSIS — M62838 Other muscle spasm: Secondary | ICD-10-CM

## 2017-12-04 DIAGNOSIS — M6281 Muscle weakness (generalized): Secondary | ICD-10-CM

## 2017-12-04 DIAGNOSIS — M25612 Stiffness of left shoulder, not elsewhere classified: Secondary | ICD-10-CM

## 2017-12-04 DIAGNOSIS — M25512 Pain in left shoulder: Secondary | ICD-10-CM

## 2017-12-05 ENCOUNTER — Ambulatory Visit (INDEPENDENT_AMBULATORY_CARE_PROVIDER_SITE_OTHER): Payer: BLUE CROSS/BLUE SHIELD | Admitting: Vascular Surgery

## 2017-12-05 ENCOUNTER — Encounter (INDEPENDENT_AMBULATORY_CARE_PROVIDER_SITE_OTHER): Payer: Self-pay | Admitting: Vascular Surgery

## 2017-12-05 VITALS — BP 131/78 | HR 86 | Resp 17 | Ht 66.5 in | Wt 369.0 lb

## 2017-12-05 DIAGNOSIS — I10 Essential (primary) hypertension: Secondary | ICD-10-CM | POA: Diagnosis not present

## 2017-12-05 DIAGNOSIS — I82431 Acute embolism and thrombosis of right popliteal vein: Secondary | ICD-10-CM

## 2017-12-05 NOTE — Therapy (Signed)
[facility] [address] [place], [address] Phone: [phone]   Fax:  [phone]  Physical Therapy Treatment  Patient Details  Name: Danielle Irwin MRN: [id number] Date of Birth: 11-22-1970 Referring Provider: Katha Hamming MD   Encounter Date: 12/04/2017  PT End of Session - 12/04/17 1119    Visit Number  27    Number of Visits  32    Date for PT Re-Evaluation  12/23/17    Authorization Type  5 of 6 FOTO    PT Start Time  1033    PT Stop Time  1123    PT Time Calculation (min)  50 min    Activity Tolerance  Patient tolerated treatment well;Patient limited by pain    Behavior During Therapy  [facility] for tasks assessed/performed       Past Medical History:  Diagnosis Date  . Asthma   . Depression   . Family history of adverse reaction to anesthesia    FATHER WAS SLOW TO WAKE UP  . Headache    OCCASIONAL MIGRAINES  . HNP (herniated nucleus pulposus), lumbar   . Hx of epistaxis   . Insomnia   . OA (osteoarthritis)   . Obesity   . PCOS (polycystic ovarian syndrome)   . Sensitive skin   . Vitamin D deficiency     Past Surgical History:  Procedure Laterality Date  . LUMBAR LAMINECTOMY/DECOMPRESSION MICRODISCECTOMY Right 06/02/2014   Procedure: MICRO LUMBAR DECOMPRESSION L4 - L5 ON THE RIGHT 1 LEVEL;  Surgeon: Johnn Hai, MD;  [facility];  Service: Orthopedics;  Laterality: Right;  . ORIF HUMERUS FRACTURE Left 08/01/2017   Procedure: OPEN REDUCTION INTERNAL FIXATION (ORIF) PROXIMAL HUMERUS FRACTURE;  Surgeon: Shona Needles, MD;  Location: [place];  Service: Orthopedics;  Laterality: Left;  . WISDOM TOOTH EXTRACTION      There were no vitals filed for this visit.  Subjective Assessment - 12/04/17 1033    Subjective  Patient reports her back is still sore on left side and her left arm is improving slowly and still painful. she reports the iontophoresis is helping with pain control    Pertinent History  Patient reports she fell into a storm door at neighbors after stubbing toe on step 07/30/17. She felt immediate pain and numbness left UE and she could not move her left UE. She was transported to ED and then transported to [facility] in [place] and underwent surgery with ORIF 08/01/17 and released to home 08/03/17.     Limitations  Lifting;Walking;House hold activities out of work; personal care; staying at parent;s due to limitations    Patient Stated Goals  to be able to use her arm again for personal care, return to all ADL's with minimal difficutly    Currently in Pain?  Yes    Pain Score  6     Pain Location  Shoulder    Pain Orientation  Left    Pain Descriptors / Indicators  Aching;Sore    Pain Type  Acute pain;Surgical pain    Pain Onset  More than a month ago    Pain Frequency  Intermittent          Objective: Palpation: hypersensitive left UE anterior aspect of shoulder/pectoral muslces and mid upper arm lateral to incision    Treatment:   Therapeutic exercise: patient performed with assistance, verbal and tactile cues of therapist:  Supiine: AAROM left shoulder 10 reps 3 sets:  up to 130+ degrees  flexion; pain limits further motion; ER/IR AAROM within pain free motion mid ranges; 0-40 degrees AROM 3 reps chest press left UE with slow steady motion and patient reporting lots of effort needed to perform task   Modalities: Electrical stimulation: 20 JHE:[id number] stim. 10/10 cycle applied (2) electrodes to left shoulder periscapular muscles rhomboids/lower intensity to tolerance with patient reclined with left UE supported on pillow: muscle re education/pain: no adverse reactions noted   Iontophoresis with dexamethasone 4mg /ml @ 40 ma*min applied large electrode to lateral aspect left shoulder/upper arm and anterior aspect of shoulder over tender area (10 min application) following ther ex.: no adverse reaction noted: goal: inflammation/pain   Patient response  to treatment: improved motor control and able to perform exercises with improved technique with repetition and assistance of therapist         PT Education - 12/04/17 1039    Education provided  Yes    Education Details  exercise instruction    Person(s) Educated  Patient    Methods  Explanation;Verbal cues    Comprehension  Verbalized understanding;Verbal cues required          PT Long Term Goals - 11/11/17 1700      PT LONG TERM GOAL #1   Title  Patient will improve FOTO score to 25/100 demonstrating improvement with functional use left UE for daily tasks    Baseline  FOTO 4/100; 09/04/17 4/100 no change due to severity of injury/surgery and continued restrictions on use/movement    Status  Revised    Target Date  12/23/17      PT LONG TERM GOAL #2   Title  Patient will improve FOTO score to 40/100 demonstrating improvement with functional use left UE for daily tasks    Baseline  FOTO 4/100    Status  Revised    Target Date  12/23/17      PT LONG TERM GOAL #3   Title  patient will be independent with home exercises for flexibility and strength to allow transition to self management once discharged from physical therapy    Baseline  requires assistance for ROM left shoulder, guidance and cuing to perform all exercises    Status  On-going    Target Date  12/23/17            Plan - 12/04/17 1130    Clinical Impression Statement  Patient continues slow, steady progress towards goals and she heals from fracture/ORIF left humerus. She is progressing with functional use and has significant decreased strength and pain that limit progress. She will continue to require physical therapy intervention to achieve maximal funtional return.      Rehab Potential  Good    Clinical Impairments Affecting Rehab Potential  (+) motivated, acute condition (-)obesity, DDD, lumbar     PT Frequency  2x / week    PT Duration  6 weeks    PT Treatment/Interventions  Manual  techniques;Neuromuscular re-education;Patient/family education;Cryotherapy;Electrical Stimulation;Moist Heat;Therapeutic exercise;Therapeutic activities;Balance training;Scar mobilization;Passive range of motion;Iontophoresis 4mg /ml Dexamethasone    PT Next Visit Plan  pain control, progressive exercises PROM/AAROM shoulder and AROM elbow to hand to improve control/strength left UE; iontophoresis 4mg /ml Dexamethasone    PT Home Exercise Plan  AROM left elbow, wrist, hand, pendulum exercise for shoulder; AAROM left shoulder supine, isometric hold at 90 degrees elevation 90 degrees       Patient will benefit from skilled therapeutic intervention in order to improve the following deficits and impairments:  Pain, Impaired UE functional use, Impaired perceived functional ability, Decreased strength, Decreased range of motion, Decreased endurance, Decreased activity tolerance, Decreased balance, Obesity  Visit Diagnosis: Stiffness of left shoulder, not elsewhere classified  Acute pain of left shoulder  Muscle weakness (generalized)  Other muscle spasm     Problem List Patient Active Problem List   Diagnosis Date Noted  . DVT (deep venous thrombosis) ([place]) 08/29/2017  . Leukocytosis 08/06/2017  . Humerus fracture 08/01/2017  . CPAP (continuous positive airway pressure) dependence 08/01/2017  . Asthma 08/01/2017  . HTN (hypertension) 06/24/2017  . Depression 11/07/2015  . Systolic murmur 02/33/4356  . Morbid obesity ([place]) 10/26/2014  . Insomnia 06/21/2014  . Anxiety 06/21/2014  . Spinal stenosis at L4-L5 level 06/02/2014  . POLYCYSTIC OVARIAN DISEASE 06/19/2010    Jomarie Longs PT 12/05/2017, 8:47 AM  [place] [facility] [address], [place], [address] Phone: [phone]   Fax:  [phone]  Name: Danielle Irwin MRN: [id number] Date of Birth: 01-04-1971

## 2017-12-05 NOTE — Progress Notes (Signed)
MRN : [id number]  Danielle Irwin is a 47 y.o. (January 25, 1971) female who presents with chief complaint of  Chief Complaint  Patient presents with  . Follow-up    Venous reflux Right leg  .  History of Present Illness: Patient returns today in follow up of her DVT. Her leg is doing well. Very little swelling. Pain pretty much resolved.  Her duplex that was performed earlier this week at the hospital demonstrates no evidence of DVT which means that her right popliteal vein DVT has resolved.  Current Outpatient Medications  Medication Sig Dispense Refill  . albuterol (VENTOLIN HFA) 108 (90 Base) MCG/ACT inhaler Inhale 2 puffs into the lungs every 6 (six) hours as needed. (Patient taking differently: Inhale 2 puffs into the lungs every 6 (six) hours as needed for wheezing or shortness of breath. ) 1 Inhaler 1  . Alum & Mag Hydroxide-Simeth (ANTACID ANTI-GAS PO) Take 1 tablet by mouth daily as needed (for gas).    . Ascorbic Acid (VITAMIN C) 1000 MG tablet Take 1,000 mg by mouth 2 (two) times daily.    Marland Kitchen aspirin EC 81 MG tablet Take 81 mg by mouth daily.    Marland Kitchen aspirin-acetaminophen-caffeine (EXCEDRIN MIGRAINE) 250-250-65 MG tablet Take 2 tablets by mouth 2 (two) times daily as needed for headache or migraine.    Marland Kitchen b complex vitamins tablet Take 1 tablet by mouth daily.    . Calcium Carbonate-Vitamin D (CALCIUM-D PO) Take 1,200 mg by mouth 2 (two) times daily.    . Cholecalciferol (VITAMIN D-3) 5000 units TABS Take 5,000 Units by mouth 2 (two) times daily.    . diphenhydrAMINE (BENADRYL) 25 MG tablet Take 25-50 mg by mouth every 6 (six) hours as needed for itching, allergies or sleep.    Marland Kitchen docusate sodium (COLACE) 100 MG capsule Take 1 capsule (100 mg total) by mouth 2 (two) times daily as needed for mild constipation. 20 capsule 1  . ECHINACEA-GOLDEN SEAL PO Take 1 tablet by mouth 2 (two) times daily as needed (immune system boost).     Marland Kitchen ELIQUIS STARTER PACK (ELIQUIS STARTER PACK) 5 MG TABS Take  5 mg by mouth 2 (two) times daily. 60 each 7  . hydrochlorothiazide (HYDRODIURIL) 25 MG tablet Take 1 tablet (25 mg total) by mouth daily. (Patient taking differently: Take 25-50 mg by mouth daily. ) 30 tablet 11  . HYDROcodone-acetaminophen (NORCO) 7.5-325 MG tablet Take 1-2 tablets by mouth every 4 (four) hours as needed for moderate pain ((score 4 to 6)). 40 tablet 0  . lisinopril (PRINIVIL,ZESTRIL) 10 MG tablet Take 1 tablet (10 mg total) by mouth daily. (Patient taking differently: Take 9 mg by mouth at bedtime. ) 90 tablet 3  . Magnesium 500 MG TABS Take 500 mg by mouth at bedtime.    . Melatonin 10 MG TABS Take 1 tablet by mouth at bedtime.     . methocarbamol (ROBAXIN) 750 MG tablet Take 1 tablet (750 mg total) by mouth every 8 (eight) hours as needed for muscle spasms. 30 tablet 0  . methocarbamol (ROBAXIN) 750 MG tablet Take 1 tablet (750 mg total) by mouth 3 (three) times daily. 30 tablet 0  . Multiple Vitamin (MULTIVITAMIN WITH MINERALS) TABS tablet Take 1 tablet by mouth daily.    . Multiple Vitamins-Minerals (ZINC PO) Take 500 mg by mouth daily as needed (immune system boost).    . mupirocin ointment (BACTROBAN) 2 % Apply 1 application topically 2 (two) times daily. To affected  area (Patient taking differently: Apply 1 application topically 2 (two) times daily as needed (wound care). ) 15 g 1  . polyethylene glycol (MIRALAX / GLYCOLAX) packet Take 17 g by mouth daily as needed for moderate constipation or severe constipation. 14 each 0  . senna-docusate (SENOKOT-S) 8.6-50 MG tablet Take 1 tablet by mouth at bedtime as needed for mild constipation. 15 tablet 0  . sertraline (ZOLOFT) 100 MG tablet Take 1 tablet (100 mg total) by mouth daily. 30 tablet 3  . silver sulfADIAZINE (SILVADENE) 1 % cream Apply 1 application topically 2 (two) times daily. (Patient taking differently: Apply 1 application topically 2 (two) times daily as needed (wound care). ) 50 g 0  . St Johns Wort 300 MG CAPS  Take 900 mg by mouth 2 (two) times daily.     . traMADol (ULTRAM) 50 MG tablet Take by mouth 2 (two) times daily as needed (back pain).     No current facility-administered medications for this visit.     Past Medical History:  Diagnosis Date  . Asthma   . Depression   . Family history of adverse reaction to anesthesia    FATHER WAS SLOW TO WAKE UP  . Headache    OCCASIONAL MIGRAINES  . HNP (herniated nucleus pulposus), lumbar   . Hx of epistaxis   . Insomnia   . OA (osteoarthritis)   . Obesity   . PCOS (polycystic ovarian syndrome)   . Sensitive skin   . Vitamin D deficiency     Past Surgical History:  Procedure Laterality Date  . LUMBAR LAMINECTOMY/DECOMPRESSION MICRODISCECTOMY Right 06/02/2014   Procedure: MICRO LUMBAR DECOMPRESSION L4 - L5 ON THE RIGHT 1 LEVEL;  Surgeon: Johnn Hai, MD;  Location: [facility];  Service: Orthopedics;  Laterality: Right;  . ORIF HUMERUS FRACTURE Left 08/01/2017   Procedure: OPEN REDUCTION INTERNAL FIXATION (ORIF) PROXIMAL HUMERUS FRACTURE;  Surgeon: Shona Needles, MD;  Location: [place];  Service: Orthopedics;  Laterality: Left;  . WISDOM TOOTH EXTRACTION     Family History  Problem Relation Age of Onset  . Heart attack Father 36  . Breast cancer Maternal Aunt 50  No bleeding or clotting disorders  Social History Social History        Tobacco Use  . Smoking status: Former Smoker    Last attempt to quit: 05/26/1990    Years since quitting: 27.2  . Smokeless tobacco: Never Used  Substance Use Topics  . Alcohol use: No    Alcohol/week: 0.0 oz  . Drug use: No         Allergies  Allergen Reactions  . Benzalkonium Chloride Anaphylaxis and Hives  . Neosporin [Neomycin-Bacitracin Zn-Polymyx] Anaphylaxis and Hives  . Bee Venom Hives and Swelling    Throat swelling  . Bupropion Hcl Other (See Comments)    homicidal  . Cephalexin Hives and Swelling  . Latex        REVIEW OF SYSTEMS (Negative unless  checked)  Constitutional: [] Weight loss  [] Fever  [] Chills Cardiac: [] Chest pain   [] Chest pressure   [] Palpitations   [] Shortness of breath when laying flat   [] Shortness of breath at rest   [] Shortness of breath with exertion. Vascular:  [] Pain in legs with walking   [] Pain in legs at rest   [] Pain in legs when laying flat   [] Claudication   [] Pain in feet when walking  [] Pain in feet at rest  [] Pain in feet when laying flat   [x] History of  DVT   [x] Phlebitis   [x] Swelling in legs   [] Varicose veins   [] Non-healing ulcers Pulmonary:   [] Uses home oxygen   [] Productive cough   [] Hemoptysis   [] Wheeze  [] COPD   [] Asthma Neurologic:  [] Dizziness  [] Blackouts   [] Seizures   [] History of stroke   [] History of TIA  [] Aphasia   [] Temporary blindness   [] Dysphagia   [] Weakness or numbness in arms   [] Weakness or numbness in legs Musculoskeletal:  [x] Arthritis   [] Joint swelling   [] Joint pain   [] Low back pain Hematologic:  [x] Easy bruising  [] Easy bleeding   [] Hypercoagulable state   [] Anemic  [] Hepatitis Gastrointestinal:  [] Blood in stool   [] Vomiting blood  [] Gastroesophageal reflux/heartburn   [] Abdominal pain Genitourinary:  [] Chronic kidney disease   [] Difficult urination  [] Frequent urination  [] Burning with urination   [] Hematuria Skin:  [] Rashes   [] Ulcers   [] Wounds Psychological:  [] History of anxiety   []  History of major depression.   Physical Examination  BP 131/78 (BP Location: Right Arm, Patient Position: Sitting)   Pulse 86   Resp 17   Ht 5' 6.5" (1.689 m)   Wt (!) 369 lb (167.4 kg)   BMI 58.67 kg/m  Gen:  WD/WN, NAD. Obese  Head: [place]/AT, No temporalis wasting. Ear/Nose/Throat: Hearing grossly intact, nares w/o erythema or drainage Eyes: Conjunctiva clear. Sclera non-icteric Neck: Supple.  Trachea midline Pulmonary:  Good air movement, no use of accessory muscles.  Cardiac: RRR, no JVD Vascular:  Vessel Right Left  Radial Palpable Palpable                            PT Palpable Palpable  DP Palpable Palpable     Musculoskeletal: M/S 5/5 throughout.  No deformity or atrophy.  Minimal lower extremity edema present today. Neurologic: Sensation grossly intact in extremities.  Symmetrical.  Speech is fluent.  Psychiatric: Judgment intact, Mood & affect appropriate for pt's clinical situation. Dermatologic: No rashes or ulcers noted.  No cellulitis or open wounds.       Labs No results found for this or any previous visit (from the past 2160 hour(s)).  Radiology US Venous Img Lower Unilateral Right  Result Date: 12/03/2017 CLINICAL DATA:  DVT of the right popliteal vein. Evaluate for acute or chronic DVT EXAM: RIGHT LOWER EXTREMITY VENOUS DOPPLER ULTRASOUND TECHNIQUE: Gray-scale sonography with graded compression, as well as color Doppler and duplex ultrasound were performed to evaluate the lower extremity deep venous systems from the level of the common femoral vein and including the common femoral, femoral, profunda femoral, popliteal and calf veins including the posterior tibial, peroneal and gastrocnemius veins when visible. The superficial great saphenous vein was also interrogated. Spectral Doppler was utilized to evaluate flow at rest and with distal augmentation maneuvers in the common femoral, femoral and popliteal veins. COMPARISON:  Right lower extremity venous Doppler ultrasound - 08/21/2017 FINDINGS: Contralateral Common Femoral Vein: Respiratory phasicity is normal and symmetric with the symptomatic side. No evidence of thrombus. Normal compressibility. Common Femoral Vein: No evidence of thrombus. Normal compressibility, respiratory phasicity and response to augmentation. Saphenofemoral Junction: No evidence of thrombus. Normal compressibility and flow on color Doppler imaging. Profunda Femoral Vein: No evidence of thrombus. Normal compressibility and flow on color Doppler imaging. Femoral Vein: No evidence of thrombus. Normal compressibility,  respiratory phasicity and response to augmentation. Popliteal Vein: No evidence of acute or chronic thrombus. Normal compressibility, respiratory phasicity and response to augmentation.  Calf Veins: No evidence of thrombus. Normal compressibility and flow on color Doppler imaging. Superficial Great Saphenous Vein: No evidence of thrombus. Normal compressibility. Venous Reflux:  None. Other Findings: Note is made of a benign appearing non pathologically enlarged right inguinal lymph node which is not enlarged by size criteria measuring 0.8 cm in greatest short axis diameter and maintains a benign fatty hilum (representative image 35). IMPRESSION: No evidence of acute or chronic DVT within the right lower extremity with special attention paid to the right popliteal vein. Electronically Signed   By: Sandi Mariscal M.D.   On: 12/03/2017 15:56    Assessment/Plan HTN (hypertension) blood pressure control important in reducing the progression of atherosclerotic disease. On appropriate oral medications.   Morbid obesity Increases her risk of pain and swelling from postphlebitic symptoms.  DVT (deep venous thrombosis) (HCC) Her duplex that was performed earlier this week at the hospital demonstrates no evidence of DVT which means that her right popliteal vein DVT has resolved. For a distal DVT, 3 months of therapy would be appropriate.  At this point, she can stop anticoagulation and just take aspirin daily.  She is at risk for postphlebitic syndrome or recurrence and discussion of compression stockings as well as aspirin were performed with the patient today.  I will see her back as needed.    Leotis Pain, MD  12/05/2017 4:45 PM    This note was created with Dragon medical transcription system.  Any errors from dictation are purely unintentional

## 2017-12-05 NOTE — Assessment & Plan Note (Signed)
Her duplex that was performed earlier this week at the hospital demonstrates no evidence of DVT which means that her right popliteal vein DVT has resolved. For a distal DVT, 3 months of therapy would be appropriate.  At this point, she can stop anticoagulation and just take aspirin daily.  She is at risk for postphlebitic syndrome or recurrence and discussion of compression stockings as well as aspirin were performed with the patient today.  I will see her back as needed.

## 2017-12-09 ENCOUNTER — Ambulatory Visit: Payer: BLUE CROSS/BLUE SHIELD | Admitting: Physical Therapy

## 2017-12-10 ENCOUNTER — Encounter: Payer: Self-pay | Admitting: Physical Therapy

## 2017-12-10 ENCOUNTER — Ambulatory Visit: Payer: BLUE CROSS/BLUE SHIELD | Attending: Student | Admitting: Physical Therapy

## 2017-12-10 DIAGNOSIS — M62838 Other muscle spasm: Secondary | ICD-10-CM | POA: Diagnosis present

## 2017-12-10 DIAGNOSIS — M25512 Pain in left shoulder: Secondary | ICD-10-CM | POA: Diagnosis present

## 2017-12-10 DIAGNOSIS — M25612 Stiffness of left shoulder, not elsewhere classified: Secondary | ICD-10-CM | POA: Insufficient documentation

## 2017-12-10 DIAGNOSIS — M6281 Muscle weakness (generalized): Secondary | ICD-10-CM

## 2017-12-10 NOTE — Therapy (Signed)
[facility] [address], [place], [address] Phone: [phone]   Fax:  [phone]  Physical Therapy Treatment  Patient Details  Name: Danielle Irwin MRN: [id number] Date of Birth: 25-Jan-1971 Referring Provider: Katha Hamming MD   Encounter Date: 12/10/2017  PT End of Session - 12/10/17 1119    Visit Number  30    Number of Visits  32    Date for PT Re-Evaluation  12/23/17    Authorization Type  6 of 6 FOTO    PT Start Time  1105    PT Stop Time  1200    PT Time Calculation (min)  55 min    Activity Tolerance  Patient tolerated treatment well;Patient limited by pain    Behavior During Therapy  [facility] for tasks assessed/performed       Past Medical History:  Diagnosis Date  . Asthma   . Depression   . Family history of adverse reaction to anesthesia    FATHER WAS SLOW TO WAKE UP  . Headache    OCCASIONAL MIGRAINES  . HNP (herniated nucleus pulposus), lumbar   . Hx of epistaxis   . Insomnia   . OA (osteoarthritis)   . Obesity   . PCOS (polycystic ovarian syndrome)   . Sensitive skin   . Vitamin D deficiency     Past Surgical History:  Procedure Laterality Date  . LUMBAR LAMINECTOMY/DECOMPRESSION MICRODISCECTOMY Right 06/02/2014   Procedure: MICRO LUMBAR DECOMPRESSION L4 - L5 ON THE RIGHT 1 LEVEL;  Surgeon: Johnn Hai, MD;  Location: [facility];  Service: Orthopedics;  Laterality: Right;  . ORIF HUMERUS FRACTURE Left 08/01/2017   Procedure: OPEN REDUCTION INTERNAL FIXATION (ORIF) PROXIMAL HUMERUS FRACTURE;  Surgeon: Shona Needles, MD;  Location: [place];  Service: Orthopedics;  Laterality: Left;  . WISDOM TOOTH EXTRACTION      There were no vitals filed for this visit.  Subjective Assessment - 12/10/17 1117    Subjective  Patient reports she is having back pain today left side and she is now able to use left UE and start resistive exercise.     Pertinent History  Patient reports she fell into a  storm door at neighbors after stubbing toe on step 07/30/17. She felt immediate pain and numbness left UE and she could not move her left UE. She was transported to ED and then transported to [facility] in [place] and underwent surgery with ORIF 08/01/17 and released to home 08/03/17.     Limitations  Lifting;Walking;House hold activities out of work; personal care; staying at parent;s due to limitations    Patient Stated Goals  to be able to use her arm again for personal care, return to all ADL's with minimal difficutly    Currently in Pain?  Yes    Pain Score  5     Pain Location  Shoulder    Pain Descriptors / Indicators  Aching    Pain Type  Surgical pain 08/01/2017    Pain Onset  More than a month ago    Pain Frequency  Intermittent           Objective: Palpation: hypersensitive left UE anterior aspect of shoulder/pectoral muslces and mid upper arm lateral to incision    Treatment:   Therapeutic exercise: patient performed with assistance, verbal and tactile cues of therapist:  Supine: AAROM left shoulder 10 reps 3 sets: up to 140+ degrees  flexion; pain limits further motion;  ER/IR AAROM within pain free motion mid ranges; 0-40 degrees AROM left shoulder to 90 degrees 3 x 5 reps resisted left elbow flexion 2# weight x 15 reps resisted shoulder rotations with 1# weight x 15 reps   Modalities: Electrical stimulation: 20 [id number] stim. 10/10 cycle applied (2) electrodes to left shoulder periscapular muscles rhomboids/lower intensity to tolerance with patient reclined with left UE supported on pillow: muscle re education/pain: no adverse reactions noted   Iontophoresis with dexamethasone 4mg /ml @ 40 ma*min applied large electrode to lateral aspect left shoulder/upper arm and anterior aspect of shoulder over tender area (10 min application) following ther ex.: no adverse reaction noted: goal: inflammation/pain   Patient response to treatment: improved motor control and able to  perform exercises with improved technique with repetition and assistance of therapist        PT Education - 12/10/17 1119    Education provided  Yes    Education Details  exercise instruction    Person(s) Educated  Patient    Methods  Explanation;Verbal cues    Comprehension  Verbalized understanding;Verbal cues required          PT Long Term Goals - 11/11/17 1700      PT LONG TERM GOAL #1   Title  Patient will improve FOTO score to 25/100 demonstrating improvement with functional use left UE for daily tasks    Baseline  FOTO 4/100; 09/04/17 4/100 no change due to severity of injury/surgery and continued restrictions on use/movement    Status  Revised    Target Date  12/23/17      PT LONG TERM GOAL #2   Title  Patient will improve FOTO score to 40/100 demonstrating improvement with functional use left UE for daily tasks    Baseline  FOTO 4/100    Status  Revised    Target Date  12/23/17      PT LONG TERM GOAL #3   Title  patient will be independent with home exercises for flexibility and strength to allow transition to self management once discharged from physical therapy    Baseline  requires assistance for ROM left shoulder, guidance and cuing to perform all exercises    Status  On-going    Target Date  12/23/17            Plan - 12/10/17 1222    Clinical Impression Statement  Patient is progressing towards goals with improving strength, ROM and functional use left UE. She continues with limitations in strength and ROM that limit function at or above shoulder level and will therefore benefit from continued physical therapy intervention.     Rehab Potential  Good    Clinical Impairments Affecting Rehab Potential  (+) motivated, acute condition (-)obesity, DDD, lumbar     PT Frequency  2x / week    PT Duration  6 weeks    PT Treatment/Interventions  Manual techniques;Neuromuscular re-education;Patient/family education;Cryotherapy;Electrical Stimulation;Moist  Heat;Therapeutic exercise;Therapeutic activities;Balance training;Scar mobilization;Passive range of motion;Iontophoresis 4mg /ml Dexamethasone    PT Next Visit Plan  pain control, progressive exercises PROM/AAROM shoulder and AROM elbow to hand to improve control/strength left UE; iontophoresis 4mg /ml Dexamethasone    PT Home Exercise Plan  AROM left elbow, wrist, hand, pendulum exercise for shoulder; AAROM left shoulder supine, isometric hold at 90 degrees elevation 90 degrees       Patient will benefit from skilled therapeutic intervention in order to improve the following deficits and impairments:  Pain, Impaired UE functional use, Impaired perceived functional ability,  Decreased strength, Decreased range of motion, Decreased endurance, Decreased activity tolerance, Decreased balance, Obesity  Visit Diagnosis: Stiffness of left shoulder, not elsewhere classified  Acute pain of left shoulder  Muscle weakness (generalized)  Other muscle spasm     Problem List Patient Active Problem List   Diagnosis Date Noted  . DVT (deep venous thrombosis) ([place]) 08/29/2017  . Leukocytosis 08/06/2017  . Humerus fracture 08/01/2017  . CPAP (continuous positive airway pressure) dependence 08/01/2017  . Asthma 08/01/2017  . HTN (hypertension) 06/24/2017  . Depression 11/07/2015  . Systolic murmur 27/12/8673  . Morbid obesity ([place]) 10/26/2014  . Insomnia 06/21/2014  . Anxiety 06/21/2014  . Spinal stenosis at L4-L5 level 06/02/2014  . POLYCYSTIC OVARIAN DISEASE 06/19/2010    Jomarie Longs PT 12/10/2017, 3:25 PM  [facility] [address], [place], [address] Phone: [phone]   Fax:  [phone]  Name: Danielle Irwin MRN: [id number] Date of Birth: April 21, 1971

## 2017-12-16 ENCOUNTER — Encounter: Payer: Self-pay | Admitting: Physical Therapy

## 2017-12-16 ENCOUNTER — Ambulatory Visit: Payer: BLUE CROSS/BLUE SHIELD | Admitting: Physical Therapy

## 2017-12-16 DIAGNOSIS — M25612 Stiffness of left shoulder, not elsewhere classified: Secondary | ICD-10-CM | POA: Diagnosis not present

## 2017-12-16 DIAGNOSIS — M6281 Muscle weakness (generalized): Secondary | ICD-10-CM

## 2017-12-16 DIAGNOSIS — M62838 Other muscle spasm: Secondary | ICD-10-CM

## 2017-12-16 DIAGNOSIS — M25512 Pain in left shoulder: Secondary | ICD-10-CM

## 2017-12-17 NOTE — Therapy (Signed)
[facility] [address] [place], [address] Phone: [phone]   Fax:  [phone]  Physical Therapy Treatment  Patient Details  Name: Danielle Irwin MRN: [id number] Date of Birth: 29-Jul-1970 Referring Provider: Katha Hamming MD   Encounter Date: 12/16/2017  PT End of Session - 12/16/17 1201    Visit Number  31    Number of Visits  32    Date for PT Re-Evaluation  12/23/17    Authorization Type  1 of 6 FOTO    PT Start Time  1117    PT Stop Time  1204    PT Time Calculation (min)  47 min    Activity Tolerance  Patient tolerated treatment well;Patient limited by pain    Behavior During Therapy  [facility] for tasks assessed/performed       Past Medical History:  Diagnosis Date  . Asthma   . Depression   . Family history of adverse reaction to anesthesia    FATHER WAS SLOW TO WAKE UP  . Headache    OCCASIONAL MIGRAINES  . HNP (herniated nucleus pulposus), lumbar   . Hx of epistaxis   . Insomnia   . OA (osteoarthritis)   . Obesity   . PCOS (polycystic ovarian syndrome)   . Sensitive skin   . Vitamin D deficiency     Past Surgical History:  Procedure Laterality Date  . LUMBAR LAMINECTOMY/DECOMPRESSION MICRODISCECTOMY Right 06/02/2014   Procedure: MICRO LUMBAR DECOMPRESSION L4 - L5 ON THE RIGHT 1 LEVEL;  Surgeon: Johnn Hai, MD;  Location: [facility];  Service: Orthopedics;  Laterality: Right;  . ORIF HUMERUS FRACTURE Left 08/01/2017   Procedure: OPEN REDUCTION INTERNAL FIXATION (ORIF) PROXIMAL HUMERUS FRACTURE;  Surgeon: Shona Needles, MD;  Location: [place];  Service: Orthopedics;  Laterality: Left;  . WISDOM TOOTH EXTRACTION      There were no vitals filed for this visit.  Subjective Assessment - 12/16/17 1147    Subjective  Patient reports she is begining to use left UE more and still has pain anterior aspect of shoulder and into upper arm. She is also continuing with lower back pain.     Pertinent History  Patient reports she fell into a storm door at neighbors after stubbing toe on step 07/30/17. She felt immediate pain and numbness left UE and she could not move her left UE. She was transported to ED and then transported to [facility] in [place] and underwent surgery with ORIF 08/01/17 and released to home 08/03/17.     Limitations  Lifting;Walking;House hold activities out of work; personal care; staying at parent;s due to limitations    Patient Stated Goals  to be able to use her arm again for personal care, return to all ADL's with minimal difficutly    Currently in Pain?  Yes    Pain Score  3     Pain Location  Shoulder    Pain Orientation  Left    Pain Descriptors / Indicators  Sore;Aching    Pain Type  Surgical pain 08/01/2017    Pain Onset  More than a month ago       Objective: Palpation: hypersensitive left UE anterior aspect of shoulder/pectoral muslces and mid upper arm lateral to incision and spasms/point tenderness along left side cervical spine and upper trapezius muscle  AAROM: left shoulder forward elevation supine: 140 degrees; ER 50 degrees; abduction 100 degrees AROM sitting: left shoulder forward elevation up to  90 degrees with mild hiking of shoulder Transfers: patient transferred on /off treatment table with moderate difficulty, slow and cautious movement due to lower back pain   Treatment:   Therapeutic exercise: patient performed with assistance, verbal and tactile cues of therapist:  Supine: AAROM left shoulder 10 reps 3 sets: up to 140+ degrees  flexion; pain limits further motion; ER/IR AAROM within pain free motion mid ranges; 0-40 degrees AROM left shoulder abduction to 90 degrees 3 x 5 reps RS left shoulder at 90 degrees 2 x 10 reps RS left shoulder rotations with shoulder in neutral position 2 x 10 reps  Manual therapy: 5 minutes prior to exercise patient supine: STM to cervical spine and left upper trapezius superficial techniques to  decreased pain andimproved soft tissue elasticity      Modalities: Electrical stimulation (in conjunction with exercise/iontophoresis): 20 [id number] stim. 10/10 cycle applied (2) electrodes to left shoulder periscapular muscles rhomboids/lower intensity to tolerance with patient reclined with left UE supported on pillow: muscle re education/pain: no adverse reactions noted   Iontophoresis with dexamethasone 4mg /ml @ 40 ma*min applied medium electrodes to lateral aspect left shoulder/upper arm and anterior aspect of shoulder over tender area (12 min application) : no adverse reaction noted: goal: inflammation/pain   Patient response to treatment: patient reported decreased tenderness and pain cervical spine and upper trapezius and improved ability to perform exercises following STM, iontophoresis. improved motor control and able to perform exercises with improved technique with repetition and assistance of therapist      PT Education - 12/16/17 1150    Education provided  Yes    Education Details  exercise instruction for technique    Person(s) Educated  Patient    Methods  Explanation;Verbal cues    Comprehension  Verbalized understanding;Verbal cues required          PT Long Term Goals - 11/11/17 1700      PT LONG TERM GOAL #1   Title  Patient will improve FOTO score to 25/100 demonstrating improvement with functional use left UE for daily tasks    Baseline  FOTO 4/100; 09/04/17 4/100 no change due to severity of injury/surgery and continued restrictions on use/movement    Status  Revised    Target Date  12/23/17      PT LONG TERM GOAL #2   Title  Patient will improve FOTO score to 40/100 demonstrating improvement with functional use left UE for daily tasks    Baseline  FOTO 4/100    Status  Revised    Target Date  12/23/17      PT LONG TERM GOAL #3   Title  patient will be independent with home exercises for flexibility and strength to allow transition to self management  once discharged from physical therapy    Baseline  requires assistance for ROM left shoulder, guidance and cuing to perform all exercises    Status  On-going    Target Date  12/23/17            Plan - 12/16/17 1202    Clinical Impression Statement  Patient continues to demonstrate steady progress towards goals with improving strength and ROM. She continues with pain and decreased strength that limit full function with daily activities and will benefit from continued phyiscal therapy intervention.     Rehab Potential  Good    Clinical Impairments Affecting Rehab Potential  (+) motivated, acute condition (-)obesity, DDD, lumbar     PT Frequency  2x / week  PT Duration  6 weeks    PT Treatment/Interventions  Manual techniques;Neuromuscular re-education;Patient/family education;Cryotherapy;Electrical Stimulation;Moist Heat;Therapeutic exercise;Therapeutic activities;Balance training;Scar mobilization;Passive range of motion;Iontophoresis 4mg /ml Dexamethasone    PT Next Visit Plan  pain control, progressive exercises PROM/AAROM shoulder and AROM elbow to hand to improve control/strength left UE; iontophoresis 4mg /ml Dexamethasone    PT Home Exercise Plan  AROM left elbow, wrist, hand, pendulum exercise for shoulder; AAROM left shoulder supine, isometric hold at 90 degrees elevation 90 degrees       Patient will benefit from skilled therapeutic intervention in order to improve the following deficits and impairments:  Pain, Impaired UE functional use, Impaired perceived functional ability, Decreased strength, Decreased range of motion, Decreased endurance, Decreased activity tolerance, Decreased balance, Obesity  Visit Diagnosis: Stiffness of left shoulder, not elsewhere classified  Acute pain of left shoulder  Muscle weakness (generalized)  Other muscle spasm     Problem List Patient Active Problem List   Diagnosis Date Noted  . DVT (deep venous thrombosis) ([place]) 08/29/2017  .  Leukocytosis 08/06/2017  . Humerus fracture 08/01/2017  . CPAP (continuous positive airway pressure) dependence 08/01/2017  . Asthma 08/01/2017  . HTN (hypertension) 06/24/2017  . Depression 11/07/2015  . Systolic murmur 38/18/2993  . Morbid obesity ([place]) 10/26/2014  . Insomnia 06/21/2014  . Anxiety 06/21/2014  . Spinal stenosis at L4-L5 level 06/02/2014  . POLYCYSTIC OVARIAN DISEASE 06/19/2010    Jomarie Longs PT 12/17/2017, 7:06 AM  [facility] [address] [place], [address] Phone: [phone]   Fax:  [phone]  Name: ANIKA SHORE MRN: [id number] Date of Birth: [birth year]

## 2017-12-18 ENCOUNTER — Encounter: Payer: Self-pay | Admitting: Physical Therapy

## 2017-12-18 ENCOUNTER — Ambulatory Visit: Payer: BLUE CROSS/BLUE SHIELD | Admitting: Physical Therapy

## 2017-12-18 DIAGNOSIS — M62838 Other muscle spasm: Secondary | ICD-10-CM

## 2017-12-18 DIAGNOSIS — M25612 Stiffness of left shoulder, not elsewhere classified: Secondary | ICD-10-CM

## 2017-12-18 DIAGNOSIS — M6281 Muscle weakness (generalized): Secondary | ICD-10-CM

## 2017-12-18 DIAGNOSIS — M25512 Pain in left shoulder: Secondary | ICD-10-CM

## 2017-12-18 NOTE — Therapy (Signed)
[facility] [address] [place], [address] Phone: [phone]   Fax:  [phone]  Physical Therapy Treatment  Patient Details  Name: Danielle Irwin MRN: [id number] Date of Birth: Oct 13, 1970 Referring Provider: Katha Hamming MD   Encounter Date: 12/18/2017  PT End of Session - 12/18/17 1004    Visit Number  30    Number of Visits  44    Date for PT Re-Evaluation  01/29/18    Authorization Type  2 of 6 FOTO    PT Start Time  0934    PT Stop Time  1013    PT Time Calculation (min)  39 min    Activity Tolerance  Patient tolerated treatment well;Patient limited by pain    Behavior During Therapy  [facility] for tasks assessed/performed       Past Medical History:  Diagnosis Date  . Asthma   . Depression   . Family history of adverse reaction to anesthesia    FATHER WAS SLOW TO WAKE UP  . Headache    OCCASIONAL MIGRAINES  . HNP (herniated nucleus pulposus), lumbar   . Hx of epistaxis   . Insomnia   . OA (osteoarthritis)   . Obesity   . PCOS (polycystic ovarian syndrome)   . Sensitive skin   . Vitamin D deficiency     Past Surgical History:  Procedure Laterality Date  . LUMBAR LAMINECTOMY/DECOMPRESSION MICRODISCECTOMY Right 06/02/2014   Procedure: MICRO LUMBAR DECOMPRESSION L4 - L5 ON THE RIGHT 1 LEVEL;  Surgeon: Johnn Hai, MD;  Location: [facility];  Service: Orthopedics;  Laterality: Right;  . ORIF HUMERUS FRACTURE Left 08/01/2017   Procedure: OPEN REDUCTION INTERNAL FIXATION (ORIF) PROXIMAL HUMERUS FRACTURE;  Surgeon: Shona Needles, MD;  Location: [facility];  Service: Orthopedics;  Laterality: Left;  . WISDOM TOOTH EXTRACTION      There were no vitals filed for this visit.  Subjective Assessment - 12/18/17 1002    Subjective  Patient reports she is begining to use left UE more and still has pain anterior aspect of shoulder and into upper arm. She is also continuing with lower back pain.     Pertinent History  Patient reports she fell into a storm door at neighbors after stubbing toe on step 07/30/17. She felt immediate pain and numbness left UE and she could not move her left UE. She was transported to ED and then transported to [facility] in [place] and underwent surgery with ORIF 08/01/17 and released to home 08/03/17.     Limitations  Lifting;Walking;House hold activities out of work; personal care; staying at parent;s due to limitations    Patient Stated Goals  to be able to use her arm again for personal care, return to all ADL's with minimal difficutly    Currently in Pain?  Yes    Pain Score  2     Pain Location  Shoulder    Pain Orientation  Left    Pain Descriptors / Indicators  Sore;Aching    Pain Type  Surgical pain 08/01/2017    Pain Onset  More than a month ago    Pain Frequency  Intermittent         [facility] PT Assessment - 12/18/17 1033      Assessment   Medical Diagnosis  left proximal humerus fracture ORIF    Onset Date/Surgical Date  08/01/17 injury 07/30/2017      Precautions   Type of Shoulder  Precautions  able to weight bear through left UE now      Restrictions   Weight Bearing Restrictions  No      Observation/Other Assessments   Focus on Therapeutic Outcomes (FOTO)   12/10/2017: 38/100          Objective: Palpation: hypersensitive left UE anterior aspect of shoulder/pectoral muslces and mid upper arm lateral to incision  AAROM: left shoulder forward elevation supine: 140 degrees; ER 50 degrees; abduction 100 degrees AROM sitting: left shoulder forward elevation up to 90 degrees with mild hiking of shoulder; abduction 70 degrees Transfers: patient transferred on /off treatment table with moderate difficulty, slow and cautious movement due to lower back pain   Treatment:   Therapeutic exercise: patient performed with assistance, verbal and tactile cues of therapist:  Supine with head of table elevated: AAROM left shoulder 5 reps 3 sets: up to  120+ degrees  flexion; pain limits further motion; AROM left shoulder abduction to 90 degrees 3 x 5 reps RS left shoulder at 90 degrees 2 x 10 reps RS left shoulder rotations with shoulder in neutral position 2 x 10 reps instructed in isometric exercises in standing at door frame to be performed at home ER/IR/flexion and slide towel up wall   Modalities: Electrical stimulation (in conjunction with exercise/iontophoresis): 20 [id number] stim. 10/10 cycle applied (2) electrodes to left shoulder periscapular muscles rhomboids/lower intensity to tolerance with patient reclined with left UE supported on pillow: muscle re education/pain: no adverse reactions noted   Iontophoresis with dexamethasone 4mg /ml @ 40 ma*min applied medium electrodes to lateral aspect left shoulder/upper arm and anterior aspect of shoulder over tender area (15 min application): no adverse reaction noted: goal: inflammation/pain   Patient response to treatment: Patient reported decreased tenderness left shoulder and improved ROM and strength with repetition and assistance of therapist.         PT Education - 12/18/17 1050    Education provided  Yes    Education Details  exercise instruction; added isometric exercises for left shoulder: ER/IR and flexion     Person(s) Educated  Patient    Methods  Explanation;Demonstration    Comprehension  Verbalized understanding          PT Long Term Goals - 12/18/17 1028      PT LONG TERM GOAL #1   Title  Patient will improve FOTO score to 25/100 demonstrating improvement with functional use left UE for daily tasks    Baseline  FOTO 4/100; 09/04/17 4/100 no change due to severity of injury/surgery and continued restrictions on use/movement; 11/10/17 26/100    Status  Achieved      PT LONG TERM GOAL #2   Title  Patient will improve FOTO score to 50/100 demonstrating improvement with functional use left UE for daily tasks    Baseline  FOTO 4/100; 12/10/2017 38/100    Status   On-going    Target Date  01/29/18      PT LONG TERM GOAL #3   Title  patient will be independent with home exercises for flexibility and strength to allow transition to self management once discharged from physical therapy    Baseline  requires assistance for ROM left shoulder, guidance and cuing to perform all exercises    Status  On-going    Target Date  01/29/18            Plan - 12/18/17 1025    Clinical Impression Statement  Patient is progresing steadily with goals and improving  strength slowly as she heals from surgery following left humerus fracture. She continues with decreased function with any lifting or activities at or above shoulder level or behind her back. She is responding well to current treatment for pain control, strength and flexibility and will require continued physical therapy intervention to achieve maximal functional return.     Rehab Potential  Good    Clinical Impairments Affecting Rehab Potential  (+) motivated, acute condition (-)obesity, DDD, lumbar     PT Frequency  2x / week    PT Duration  6 weeks    PT Treatment/Interventions  Manual techniques;Neuromuscular re-education;Patient/family education;Cryotherapy;Electrical Stimulation;Moist Heat;Therapeutic exercise;Therapeutic activities;Balance training;Scar mobilization;Passive range of motion;Iontophoresis 4mg /ml Dexamethasone    PT Next Visit Plan  pain control, progressive exercises PROM/AAROM shoulder and AROM elbow to hand to improve control/strength left UE; iontophoresis 4mg /ml Dexamethasone    PT Home Exercise Plan  AROM left elbow, wrist, hand, pendulum exercise for shoulder; AAROM left shoulder supine, isometric hold at 90 degrees elevation 90 degrees    Consulted and Agree with Plan of Care  Patient       Patient will benefit from skilled therapeutic intervention in order to improve the following deficits and impairments:  Pain, Impaired UE functional use, Impaired perceived functional ability,  Decreased strength, Decreased range of motion, Decreased endurance, Decreased activity tolerance, Decreased balance, Obesity  Visit Diagnosis: Stiffness of left shoulder, not elsewhere classified - Plan: PT plan of care cert/re-cert  Acute pain of left shoulder - Plan: PT plan of care cert/re-cert  Muscle weakness (generalized) - Plan: PT plan of care cert/re-cert  Other muscle spasm - Plan: PT plan of care cert/re-cert     Problem List Patient Active Problem List   Diagnosis Date Noted  . DVT (deep venous thrombosis) ([place]) 08/29/2017  . Leukocytosis 08/06/2017  . Humerus fracture 08/01/2017  . CPAP (continuous positive airway pressure) dependence 08/01/2017  . Asthma 08/01/2017  . HTN (hypertension) 06/24/2017  . Depression 11/07/2015  . Systolic murmur 36/62/9476  . Morbid obesity ([place]) 10/26/2014  . Insomnia 06/21/2014  . Anxiety 06/21/2014  . Spinal stenosis at L4-L5 level 06/02/2014  . POLYCYSTIC OVARIAN DISEASE 06/19/2010    Jomarie Longs PT 12/18/2017, 11:02 AM  [facility] [address], [place], [address] Phone: [phone]   Fax:  [phone]  Name: XCARET MORAD MRN: [id number] Date of Birth: September 27, 1970

## 2017-12-23 ENCOUNTER — Ambulatory Visit: Payer: BLUE CROSS/BLUE SHIELD | Admitting: Physical Therapy

## 2017-12-25 ENCOUNTER — Ambulatory Visit: Payer: BLUE CROSS/BLUE SHIELD | Admitting: Physical Therapy

## 2017-12-25 ENCOUNTER — Encounter: Payer: Self-pay | Admitting: Physical Therapy

## 2017-12-25 DIAGNOSIS — M25512 Pain in left shoulder: Secondary | ICD-10-CM

## 2017-12-25 DIAGNOSIS — M62838 Other muscle spasm: Secondary | ICD-10-CM

## 2017-12-25 DIAGNOSIS — M25612 Stiffness of left shoulder, not elsewhere classified: Secondary | ICD-10-CM

## 2017-12-25 DIAGNOSIS — M6281 Muscle weakness (generalized): Secondary | ICD-10-CM

## 2017-12-26 NOTE — Therapy (Signed)
[facility] [address] [place], [address] Phone: [phone]   Fax:  [phone]  Physical Therapy Treatment  Patient Details  Name: Danielle Irwin MRN: [id number] Date of Birth: 13-Jul-1970 Referring Provider: Katha Hamming MD   Encounter Date: 12/25/2017  PT End of Session - 12/25/17 1340    Visit Number  31    Number of Visits  44    Date for PT Re-Evaluation  01/29/18    Authorization Type  3 of 6 FOTO    PT Start Time  1301    PT Stop Time  1350    PT Time Calculation (min)  49 min    Activity Tolerance  Patient tolerated treatment well;Patient limited by pain    Behavior During Therapy  [facility] for tasks assessed/performed       Past Medical History:  Diagnosis Date  . Asthma   . Depression   . Family history of adverse reaction to anesthesia    FATHER WAS SLOW TO WAKE UP  . Headache    OCCASIONAL MIGRAINES  . HNP (herniated nucleus pulposus), lumbar   . Hx of epistaxis   . Insomnia   . OA (osteoarthritis)   . Obesity   . PCOS (polycystic ovarian syndrome)   . Sensitive skin   . Vitamin D deficiency     Past Surgical History:  Procedure Laterality Date  . LUMBAR LAMINECTOMY/DECOMPRESSION MICRODISCECTOMY Right 06/02/2014   Procedure: MICRO LUMBAR DECOMPRESSION L4 - L5 ON THE RIGHT 1 LEVEL;  Surgeon: Johnn Hai, MD;  Location: [facility];  Service: Orthopedics;  Laterality: Right;  . ORIF HUMERUS FRACTURE Left 08/01/2017   Procedure: OPEN REDUCTION INTERNAL FIXATION (ORIF) PROXIMAL HUMERUS FRACTURE;  Surgeon: Shona Needles, MD;  Location: [facility];  Service: Orthopedics;  Laterality: Left;  . WISDOM TOOTH EXTRACTION      There were no vitals filed for this visit.  Subjective Assessment - 12/25/17 1336    Subjective  Patient reports she is having an allergic reaction to using polysporin for underarm irritation. She reports she took 2 Benadryl prior to coming to therapy session. Her left  shoulder is improving better with movement today.     Pertinent History  Patient reports she fell into a storm door at neighbors after stubbing toe on step 07/30/17. She felt immediate pain and numbness left UE and she could not move her left UE. She was transported to ED and then transported to [facility] in [place] and underwent surgery with ORIF 08/01/17 and released to home 08/03/17.     Limitations  Lifting;Walking;House hold activities out of work; personal care; staying at parent;s due to limitations    Patient Stated Goals  to be able to use her arm again for personal care, return to all ADL's with minimal difficutly    Currently in Pain?  Yes    Pain Score  2     Pain Location  Shoulder    Pain Orientation  Left    Pain Descriptors / Indicators  Sore;Aching    Pain Type  Surgical pain 08/01/2017    Pain Onset  More than a month ago    Pain Frequency  Intermittent         Objective: Observation: redness in both palms, forearms and patient scratching self on legs and ankles on arrival (patient reports she is having a reaction to polysporin) Palpation: hypersensitive left UE anterior aspect of shoulder/pectoral muslces and mid  upper arm lateral to incision  Transfers: patient transferred on /off treatment table with moderate difficulty, slow and cautious movement due to lower back pain   Treatment:   Therapeutic exercise: patient performed with assistance, verbal and tactile cues of therapist:  Supine with head of table elevated: AAROM left shoulder 5 reps 3 sets: up to 120+ degrees  flexion; pain limits further motion AAROM left shoulder abduction 2 x 10 to 90 degrees RS left shoulder at 90 degrees 2 x 10 reps RS left shoulder rotations with shoulder in neutral position 2 x 10 reps   Modalities: Electrical stimulation (in conjunction with exercise/iontophoresis): 20 SLH:[id number] stim. 10/10 cycle applied (2) electrodes to left shoulder periscapular muscles rhomboids/lower intensity  to tolerance with patient reclined with left UE supported on pillow: muscle re education/pain: no adverse reactions noted   Iontophoresis with dexamethasone 4mg /ml @ 40 ma*min applied medium electrodes to lateral aspect left shoulder/upper arm and anterior aspect of shoulder over tender area (15 min application): no adverse reaction noted: goal: inflammation/pain   Patient response to treatment: Improved ROM with improved flexibility and reported less pain with exercises with repetition and assistance (note: redness subsided and patient reported significant decreased itching at end of session)        PT Education - 12/25/17 1329    Education provided  Yes    Education Details  exercise instruction    Person(s) Educated  Patient    Methods  Explanation;Verbal cues;Demonstration    Comprehension  Verbal cues required;Returned demonstration;Verbalized understanding          PT Long Term Goals - 12/18/17 1028      PT LONG TERM GOAL #1   Title  Patient will improve FOTO score to 25/100 demonstrating improvement with functional use left UE for daily tasks    Baseline  FOTO 4/100; 09/04/17 4/100 no change due to severity of injury/surgery and continued restrictions on use/movement; 11/10/17 26/100    Status  Achieved      PT LONG TERM GOAL #2   Title  Patient will improve FOTO score to 50/100 demonstrating improvement with functional use left UE for daily tasks    Baseline  FOTO 4/100; 12/10/2017 38/100    Status  On-going    Target Date  01/29/18      PT LONG TERM GOAL #3   Title  patient will be independent with home exercises for flexibility and strength to allow transition to self management once discharged from physical therapy    Baseline  requires assistance for ROM left shoulder, guidance and cuing to perform all exercises    Status  On-going    Target Date  01/29/18            Plan - 12/25/17 1340    Clinical Impression Statement  Patient demonstrates steady progress  with strength and ROM left shoulder s/p fx/ORIF. She continues with deficits in strength and ROM which limit full functional use of left UE and will therefore require additional physical therapy intervention.     Rehab Potential  Good    Clinical Impairments Affecting Rehab Potential  (+) motivated, acute condition (-)obesity, DDD, lumbar     PT Frequency  2x / week    PT Duration  6 weeks    PT Treatment/Interventions  Manual techniques;Neuromuscular re-education;Patient/family education;Cryotherapy;Electrical Stimulation;Moist Heat;Therapeutic exercise;Therapeutic activities;Balance training;Scar mobilization;Passive range of motion;Iontophoresis 4mg /ml Dexamethasone    PT Next Visit Plan  pain control, progressive exercises PROM/AAROM shoulder and AROM elbow to hand to  improve control/strength left UE; iontophoresis 4mg /ml Dexamethasone    PT Home Exercise Plan  AROM left elbow, wrist, hand, pendulum exercise for shoulder; AAROM left shoulder supine, isometric hold at 90 degrees elevation 90 degrees       Patient will benefit from skilled therapeutic intervention in order to improve the following deficits and impairments:  Pain, Impaired UE functional use, Impaired perceived functional ability, Decreased strength, Decreased range of motion, Decreased endurance, Decreased activity tolerance, Decreased balance, Obesity  Visit Diagnosis: Stiffness of left shoulder, not elsewhere classified  Acute pain of left shoulder  Muscle weakness (generalized)  Other muscle spasm     Problem List Patient Active Problem List   Diagnosis Date Noted  . DVT (deep venous thrombosis) ([place]) 08/29/2017  . Leukocytosis 08/06/2017  . Humerus fracture 08/01/2017  . CPAP (continuous positive airway pressure) dependence 08/01/2017  . Asthma 08/01/2017  . HTN (hypertension) 06/24/2017  . Depression 11/07/2015  . Systolic murmur 65/46/5035  . Morbid obesity ([place]) 10/26/2014  . Insomnia 06/21/2014  .  Anxiety 06/21/2014  . Spinal stenosis at L4-L5 level 06/02/2014  . POLYCYSTIC OVARIAN DISEASE 06/19/2010    Jomarie Longs PT 12/26/2017, 8:32 AM  [facility] [address], [place], [address] Phone: [phone]   Fax:  [phone]  Name: Danielle Irwin MRN: [id number] Date of Birth: 30-Jul-1970

## 2017-12-30 ENCOUNTER — Ambulatory Visit: Payer: BLUE CROSS/BLUE SHIELD | Admitting: Physical Therapy

## 2018-01-01 ENCOUNTER — Ambulatory Visit: Payer: BLUE CROSS/BLUE SHIELD | Admitting: Physical Therapy

## 2018-01-01 ENCOUNTER — Encounter: Payer: Self-pay | Admitting: Physical Therapy

## 2018-01-01 DIAGNOSIS — M25512 Pain in left shoulder: Secondary | ICD-10-CM

## 2018-01-01 DIAGNOSIS — M62838 Other muscle spasm: Secondary | ICD-10-CM

## 2018-01-01 DIAGNOSIS — M6281 Muscle weakness (generalized): Secondary | ICD-10-CM

## 2018-01-01 DIAGNOSIS — M25612 Stiffness of left shoulder, not elsewhere classified: Secondary | ICD-10-CM

## 2018-01-02 NOTE — Therapy (Signed)
[facility] [address], [place], [address] Phone: [phone]   Fax:  [phone]  Physical Therapy Treatment  Patient Details  Name: Danielle Irwin MRN: [id number] Date of Birth: 08-30-1970 Referring Provider: Katha Hamming MD   Encounter Date: 01/01/2018  PT End of Session - 01/01/18 1526    Visit Number  32    Number of Visits  44    Date for PT Re-Evaluation  01/29/18    Authorization Type  4 of 6 FOTO    PT Start Time  1521    PT Stop Time  1608    PT Time Calculation (min)  47 min    Activity Tolerance  Patient tolerated treatment well;Patient limited by pain    Behavior During Therapy  [facility] for tasks assessed/performed       Past Medical History:  Diagnosis Date  . Asthma   . Depression   . Family history of adverse reaction to anesthesia    FATHER WAS SLOW TO WAKE UP  . Headache    OCCASIONAL MIGRAINES  . HNP (herniated nucleus pulposus), lumbar   . Hx of epistaxis   . Insomnia   . OA (osteoarthritis)   . Obesity   . PCOS (polycystic ovarian syndrome)   . Sensitive skin   . Vitamin D deficiency     Past Surgical History:  Procedure Laterality Date  . LUMBAR LAMINECTOMY/DECOMPRESSION MICRODISCECTOMY Right 06/02/2014   Procedure: MICRO LUMBAR DECOMPRESSION L4 - L5 ON THE RIGHT 1 LEVEL;  Surgeon: Johnn Hai, MD;  Location: [facility];  Service: Orthopedics;  Laterality: Right;  . ORIF HUMERUS FRACTURE Left 08/01/2017   Procedure: OPEN REDUCTION INTERNAL FIXATION (ORIF) PROXIMAL HUMERUS FRACTURE;  Surgeon: Shona Needles, MD;  Location: [facility];  Service: Orthopedics;  Laterality: Left;  . WISDOM TOOTH EXTRACTION      There were no vitals filed for this visit.  Subjective Assessment - 01/01/18 1525    Subjective  Patient reports she is doing better and continues with difficulty with raising left arm and using left UE for daily activities normally. She continues with lower back pain that  is limiting her activity as well. She is going to contact her MD to have her back pain evaluated.     Pertinent History  Patient reports she fell into a storm door at neighbors after stubbing toe on step 07/30/17. She felt immediate pain and numbness left UE and she could not move her left UE. She was transported to ED and then transported to [facility] in [place] and underwent surgery with ORIF 08/01/17 and released to home 08/03/17.     Limitations  Lifting;Walking;House hold activities out of work; personal care; staying at parent;s due to limitations    Patient Stated Goals  to be able to use her arm again for personal care, return to all ADL's with minimal difficutly    Currently in Pain?  Yes    Pain Score  3     Pain Location  Shoulder    Pain Orientation  Left;Anterior    Pain Descriptors / Indicators  Aching;Sore;Spasm    Pain Type  Surgical pain 08/01/2017    Pain Onset  More than a month ago    Pain Frequency  Intermittent            Objective: Palpation: hypersensitive left UE anterior aspect of shoulder/pectoral muslces and mid upper arm lateral to incision  Transfers: patient transferred  on /off treatment table with moderate difficulty, slow and cautious movement due to lower back pain AAROM: left shoulder: supine: forward elevation 100 degrees with pain limiting further motion; ER 40; IR 40 with pain limiting further motion   Treatment:  Manual therapy: 5 min STM performed to anterior aspect left shoulder over pectoral region superficial techniques; patient with decreased tenderness and improved soft tissue mobility  Therapeutic exercise:  performed with assistance, verbal and tactile cues of therapist:  Supine with head of table elevated: AAROM left shoulder 5 reps 3 sets: up to 110+ degrees  flexion; pain limits further motion AAROM left shoulder abduction  x 10 to 90 degrees RS left shoulder at 90 degrees 2 x 10 reps RS left shoulder rotations with shoulder in neutral  position 2 x 10 reps   Modalities: Electrical stimulation (in conjunction with exercise/iontophoresis): 20 MBW:[id number] stim. 10/10 cycle applied (2) electrodes to left shoulder periscapular muscles rhomboids/lower trapezius, intensity to tolerance, with patient reclined with left UE supported on pillow: muscle re education/pain: no adverse reactions noted   Iontophoresis with dexamethasone 4mg /ml @ 40 ma*min applied medium electrodes to lateral aspect left shoulder/upper arm and anterior aspect of shoulder over tender area (13 min application): no adverse reaction noted: goal: inflammation/pain   Patient response to treatment: Improved flexibility left shoulder with improved soft tissue elasticity following STM. patient limited in exercises due to pain left shoulder       PT Education - 01/01/18 1600    Education provided  Yes    Education Details  re assessed home program for ROM and strengthening supine/sitting    Person(s) Educated  Patient    Methods  Explanation    Comprehension  Verbalized understanding          PT Long Term Goals - 12/18/17 1028      PT LONG TERM GOAL #1   Title  Patient will improve FOTO score to 25/100 demonstrating improvement with functional use left UE for daily tasks    Baseline  FOTO 4/100; 09/04/17 4/100 no change due to severity of injury/surgery and continued restrictions on use/movement; 11/10/17 26/100    Status  Achieved      PT LONG TERM GOAL #2   Title  Patient will improve FOTO score to 50/100 demonstrating improvement with functional use left UE for daily tasks    Baseline  FOTO 4/100; 12/10/2017 38/100    Status  On-going    Target Date  01/29/18      PT LONG TERM GOAL #3   Title  patient will be independent with home exercises for flexibility and strength to allow transition to self management once discharged from physical therapy    Baseline  requires assistance for ROM left shoulder, guidance and cuing to perform all exercises     Status  On-going    Target Date  01/29/18            Plan - 01/02/18 0843    Clinical Impression Statement  Patient is responding to current treatment with decreased pain in left shoulder and is progressing slowly with exercises for strength and ROM. She continues slow iimprovement due to healing fracture, continued pain, decreased strength left shoulder which limits full functional use of left UE without diffitulty at or above shoulder level. She will require continued physical therapy intervention to achieve maximal functional return.     Rehab Potential  Good    Clinical Impairments Affecting Rehab Potential  (+) motivated, acute condition (-)obesity, DDD,  lumbar     PT Frequency  2x / week    PT Duration  6 weeks    PT Treatment/Interventions  Manual techniques;Neuromuscular re-education;Patient/family education;Cryotherapy;Electrical Stimulation;Moist Heat;Therapeutic exercise;Therapeutic activities;Balance training;Scar mobilization;Passive range of motion;Iontophoresis 4mg /ml Dexamethasone    PT Next Visit Plan  pain control, progressive exercises PROM/AAROM shoulder and AROM elbow to hand to improve control/strength left UE; iontophoresis 4mg /ml Dexamethasone    PT Home Exercise Plan  AROM left elbow, wrist, hand, pendulum exercise for shoulder; AAROM left shoulder supine, isometric hold at 90 degrees elevation 90 degrees       Patient will benefit from skilled therapeutic intervention in order to improve the following deficits and impairments:  Pain, Impaired UE functional use, Impaired perceived functional ability, Decreased strength, Decreased range of motion, Decreased endurance, Decreased activity tolerance, Decreased balance, Obesity  Visit Diagnosis: Stiffness of left shoulder, not elsewhere classified  Acute pain of left shoulder  Muscle weakness (generalized)  Other muscle spasm     Problem List Patient Active Problem List   Diagnosis Date Noted  . DVT (deep  venous thrombosis) ([place]) 08/29/2017  . Leukocytosis 08/06/2017  . Humerus fracture 08/01/2017  . CPAP (continuous positive airway pressure) dependence 08/01/2017  . Asthma 08/01/2017  . HTN (hypertension) 06/24/2017  . Depression 11/07/2015  . Systolic murmur 40/76/8088  . Morbid obesity ([place]) 10/26/2014  . Insomnia 06/21/2014  . Anxiety 06/21/2014  . Spinal stenosis at L4-L5 level 06/02/2014  . POLYCYSTIC OVARIAN DISEASE 06/19/2010    Jomarie Longs PT 01/02/2018, 8:49 AM  [facility] [address], [place], [address] Phone: [phone]   Fax:  [phone]  Name: EZRAH DEMBECK MRN: [id number] Date of Birth: November 28, 1970

## 2018-01-05 ENCOUNTER — Ambulatory Visit: Payer: BLUE CROSS/BLUE SHIELD | Admitting: Physical Therapy

## 2018-01-05 ENCOUNTER — Encounter: Payer: Self-pay | Admitting: Physical Therapy

## 2018-01-05 ENCOUNTER — Encounter: Payer: Self-pay | Admitting: Family Medicine

## 2018-01-05 DIAGNOSIS — M25512 Pain in left shoulder: Secondary | ICD-10-CM

## 2018-01-05 DIAGNOSIS — M6281 Muscle weakness (generalized): Secondary | ICD-10-CM

## 2018-01-05 DIAGNOSIS — M25612 Stiffness of left shoulder, not elsewhere classified: Secondary | ICD-10-CM

## 2018-01-05 DIAGNOSIS — M62838 Other muscle spasm: Secondary | ICD-10-CM

## 2018-01-05 NOTE — Therapy (Signed)
[facility] [address], [place], [address] Phone: [phone]   Fax:  [phone]  Physical Therapy Treatment  Patient Details  Name: Danielle Irwin MRN: [id number] Date of Birth: 02/07/1971 Referring Provider: Katha Hamming MD   Encounter Date: 01/05/2018  PT End of Session - 01/05/18 1445    Visit Number  33    Number of Visits  44    Date for PT Re-Evaluation  01/29/18    Authorization Type  5 of 6 FOTO    PT Start Time  1358    PT Stop Time  1458    PT Time Calculation (min)  60 min    Activity Tolerance  Patient tolerated treatment well;Patient limited by pain    Behavior During Therapy  [facility] for tasks assessed/performed       Past Medical History:  Diagnosis Date  . Asthma   . Depression   . Family history of adverse reaction to anesthesia    FATHER WAS SLOW TO WAKE UP  . Headache    OCCASIONAL MIGRAINES  . HNP (herniated nucleus pulposus), lumbar   . Hx of epistaxis   . Insomnia   . OA (osteoarthritis)   . Obesity   . PCOS (polycystic ovarian syndrome)   . Sensitive skin   . Vitamin D deficiency     Past Surgical History:  Procedure Laterality Date  . LUMBAR LAMINECTOMY/DECOMPRESSION MICRODISCECTOMY Right 06/02/2014   Procedure: MICRO LUMBAR DECOMPRESSION L4 - L5 ON THE RIGHT 1 LEVEL;  Surgeon: Johnn Hai, MD;  Location: [facility];  Service: Orthopedics;  Laterality: Right;  . ORIF HUMERUS FRACTURE Left 08/01/2017   Procedure: OPEN REDUCTION INTERNAL FIXATION (ORIF) PROXIMAL HUMERUS FRACTURE;  Surgeon: Shona Needles, MD;  Location: [place];  Service: Orthopedics;  Laterality: Left;  . WISDOM TOOTH EXTRACTION      There were no vitals filed for this visit.  Subjective Assessment - 01/05/18 1400    Subjective  Patient reports she is still having back pain and she is having pain with coughing, sneezing. Her left shoulder/arm is less stiff and she is able to raise her UE with less  difficulty. Dressing is easier and hair care is less difficult.     Pertinent History  Patient reports she fell into a storm door at neighbors after stubbing toe on step 07/30/17. She felt immediate pain and numbness left UE and she could not move her left UE. She was transported to ED and then transported to [facility] in [place] and underwent surgery with ORIF 08/01/17 and released to home 08/03/17.     Limitations  Lifting;Walking;House hold activities out of work; personal care; staying at parent;s due to limitations    Patient Stated Goals  to be able to use her arm again for personal care, return to all ADL's with minimal difficutly    Currently in Pain?  Yes    Pain Score  3     Pain Location  Shoulder    Pain Orientation  Left;Anterior    Pain Descriptors / Indicators  Aching    Pain Type  Surgical pain 08/01/2017    Pain Onset  More than a month ago    Pain Frequency  Intermittent         Objective: Palpation: hypersensitive left UE anterior aspect of shoulder/pectoral muslces and mid upper arm lateral to incision  AAROM: left shoulder: supine: forward elevation 110 degrees with pain limiting further  motion; ER 40; IR 60 with pain limiting further motion AROM: seated pre treatment left shoulder: flexion 105; abduction 70 and ER to reach ear; IR to side of hip   Treatment:  Manual therapy: 8 min STM performed to anterior aspect left shoulder over pectoral region superficial techniques; patient with decreased tenderness and improved soft tissue mobility   Therapeutic exercise:  performed with assistance, verbal and tactile cues of therapist:  Supine with head of table elevated: AAROM left shoulder 10 reps: up to 110+ degrees  flexion; pain limited further motion AAROM left shoulder abduction  x 10 to 90 degrees isometric shoulder ER multi angle to facilitate increased ROM x 10 reps up to 40 degrees isometric shoulder IR multi angle to facilitate increased ROM x 10 reps up to 60  degrees resistive ER in supine with 3# weight in hand x 10 reps 3# weight for shoulder press left UE x 10 reps with assistance as needed   Modalities: Electrical stimulation (in conjunction with exercise/iontophoresis): 20 WIO:XBDZHGD stim. 10/10 cycle applied (2) electrodes to left shoulder periscapular muscles rhomboids/lower trapezius, intensity to tolerance, with patient reclined with left UE supported on pillow: muscle re education/pain: no adverse reactions noted   Iontophoresis with dexamethasone 4mg /ml @ 40 ma*min applied medium electrodes to lateral aspect left shoulder/upper arm and anterior aspect of shoulder over tender area (13 min application): no adverse reaction noted: goal: inflammation/pain   Patient response to treatment: Improved flexibility left shoulder following STM to anterior aspect and decreased pain with motion.       PT Education - 01/05/18 1605    Education provided  Yes    Education Details  Progress with therapy/ exercise instruction for rotations, forward elevation with weight supine    Person(s) Educated  Patient    Methods  Explanation;Verbal cues;Demonstration    Comprehension  Verbalized understanding;Returned demonstration;Verbal cues required          PT Long Term Goals - 12/18/17 1028      PT LONG TERM GOAL #1   Title  Patient will improve FOTO score to 25/100 demonstrating improvement with functional use left UE for daily tasks    Baseline  FOTO 4/100; 09/04/17 4/100 no change due to severity of injury/surgery and continued restrictions on use/movement; 11/10/17 26/100    Status  Achieved      PT LONG TERM GOAL #2   Title  Patient will improve FOTO score to 50/100 demonstrating improvement with functional use left UE for daily tasks    Baseline  FOTO 4/100; 12/10/2017 38/100    Status  On-going    Target Date  01/29/18      PT LONG TERM GOAL #3   Title  patient will be independent with home exercises for flexibility and strength to allow  transition to self management once discharged from physical therapy    Baseline  requires assistance for ROM left shoulder, guidance and cuing to perform all exercises    Status  On-going    Target Date  01/29/18            Plan - 01/05/18 1446    Clinical Impression Statement  Patient contiues with steady progress towards goals with improving AROM and strength. She continues with pain in left shoulder that is responding to manual therapy and pain control modalities and she should continue to improve with additional physical therapy intervention to address strength and ROM deficits.     Rehab Potential  Good    Clinical Impairments  Affecting Rehab Potential  (+) motivated, acute condition (-)obesity, DDD, lumbar     PT Frequency  2x / week    PT Duration  6 weeks    PT Treatment/Interventions  Manual techniques;Neuromuscular re-education;Patient/family education;Cryotherapy;Electrical Stimulation;Moist Heat;Therapeutic exercise;Therapeutic activities;Balance training;Scar mobilization;Passive range of motion;Iontophoresis 4mg /ml Dexamethasone    PT Next Visit Plan  pain control, progressive exercises PROM/AAROM shoulder and AROM elbow to hand to improve control/strength left UE; iontophoresis 4mg /ml Dexamethasone    PT Home Exercise Plan  pendulum exercise for shoulder; AAROM left shoulder supine, isometric hold at 90 degrees elevation 90 degrees; active shoulder forward elevation and abduction, ER right shoulder       Patient will benefit from skilled therapeutic intervention in order to improve the following deficits and impairments:  Pain, Impaired UE functional use, Impaired perceived functional ability, Decreased strength, Decreased range of motion, Decreased endurance, Decreased activity tolerance, Decreased balance, Obesity  Visit Diagnosis: Stiffness of left shoulder, not elsewhere classified  Acute pain of left shoulder  Muscle weakness (generalized)  Other muscle  spasm     Problem List Patient Active Problem List   Diagnosis Date Noted  . DVT (deep venous thrombosis) ([place]) 08/29/2017  . Leukocytosis 08/06/2017  . Humerus fracture 08/01/2017  . CPAP (continuous positive airway pressure) dependence 08/01/2017  . Asthma 08/01/2017  . HTN (hypertension) 06/24/2017  . Depression 11/07/2015  . Systolic murmur 51/88/4166  . Morbid obesity ([place]) 10/26/2014  . Insomnia 06/21/2014  . Anxiety 06/21/2014  . Spinal stenosis at L4-L5 level 06/02/2014  . POLYCYSTIC OVARIAN DISEASE 06/19/2010    Jomarie Longs PT 01/05/2018, 4:06 PM  [facility] [address] [place], [address] Phone: [phone]   Fax:  [phone]  Name: Danielle Irwin MRN: [id number] Date of Birth: 20-Jun-1970

## 2018-01-06 ENCOUNTER — Encounter: Payer: Self-pay | Admitting: Family Medicine

## 2018-01-06 ENCOUNTER — Ambulatory Visit (INDEPENDENT_AMBULATORY_CARE_PROVIDER_SITE_OTHER): Payer: BLUE CROSS/BLUE SHIELD | Admitting: Family Medicine

## 2018-01-06 ENCOUNTER — Other Ambulatory Visit: Payer: Self-pay

## 2018-01-06 VITALS — BP 130/86 | HR 78 | Temp 98.5°F | Ht 66.5 in | Wt 371.8 lb

## 2018-01-06 DIAGNOSIS — M48061 Spinal stenosis, lumbar region without neurogenic claudication: Secondary | ICD-10-CM

## 2018-01-06 DIAGNOSIS — M5135 Other intervertebral disc degeneration, thoracolumbar region: Secondary | ICD-10-CM | POA: Diagnosis not present

## 2018-01-06 MED ORDER — PREDNISONE 10 MG PO TABS: ORAL_TABLET | ORAL | 0 refills | Status: DC

## 2018-01-06 MED ORDER — MUPIROCIN 2 % EX OINT: 1.0000 "application " | TOPICAL_OINTMENT | Freq: Two times a day (BID) | CUTANEOUS | 1 refills | Status: DC

## 2018-01-06 NOTE — Patient Instructions (Signed)
Great to see you. Take prednisone as directed- in the morning and with food.  We will call you with your ortho referral.

## 2018-01-06 NOTE — Progress Notes (Signed)
SUBJECTIVE:  Danielle Irwin is a 47 y.o. female who complains of low back pain for 2 month(s), positional with bending or lifting, with radiation down the legs. Precipitating factors: rode in a car for 6 hours each way in June. Prior history of back problems: previous herniated disc and spinal stenosis. There is no numbness in the legs.  Current Outpatient Medications on File Prior to Visit  Medication Sig Dispense Refill  . albuterol (VENTOLIN HFA) 108 (90 Base) MCG/ACT inhaler Inhale 2 puffs into the lungs every 6 (six) hours as needed. (Patient taking differently: Inhale 2 puffs into the lungs every 6 (six) hours as needed for wheezing or shortness of breath. ) 1 Inhaler 1  . Alum & Mag Hydroxide-Simeth (ANTACID ANTI-GAS PO) Take 1 tablet by mouth daily as needed (for gas).    . Ascorbic Acid (VITAMIN C) 1000 MG tablet Take 1,000 mg by mouth 2 (two) times daily.    Marland Kitchen aspirin EC 81 MG tablet Take 81 mg by mouth daily.    Marland Kitchen aspirin-acetaminophen-caffeine (EXCEDRIN MIGRAINE) 250-250-65 MG tablet Take 2 tablets by mouth 2 (two) times daily as needed for headache or migraine.    Marland Kitchen b complex vitamins tablet Take 1 tablet by mouth daily.    . Calcium Carbonate-Vitamin D (CALCIUM-D PO) Take 1,200 mg by mouth 2 (two) times daily.    . Cholecalciferol (VITAMIN D-3) 5000 units TABS Take 5,000 Units by mouth 2 (two) times daily.    . diphenhydrAMINE (BENADRYL) 25 MG tablet Take 25-50 mg by mouth every 6 (six) hours as needed for itching, allergies or sleep.    Marland Kitchen docusate sodium (COLACE) 100 MG capsule Take 1 capsule (100 mg total) by mouth 2 (two) times daily as needed for mild constipation. 20 capsule 1  . ECHINACEA-GOLDEN SEAL PO Take 1 tablet by mouth 2 (two) times daily as needed (immune system boost).     . hydrochlorothiazide (HYDRODIURIL) 25 MG tablet Take 1 tablet (25 mg total) by mouth daily. (Patient taking differently: Take 25-50 mg by mouth daily. ) 30 tablet 11  . lisinopril (PRINIVIL,ZESTRIL) 10  MG tablet Take 1 tablet (10 mg total) by mouth daily. (Patient taking differently: Take 9 mg by mouth at bedtime. ) 90 tablet 3  . Magnesium 500 MG TABS Take 500 mg by mouth at bedtime.    . Melatonin 10 MG TABS Take 1 tablet by mouth at bedtime.     . methocarbamol (ROBAXIN) 750 MG tablet Take 1 tablet (750 mg total) by mouth every 8 (eight) hours as needed for muscle spasms. 30 tablet 0  . methocarbamol (ROBAXIN) 750 MG tablet Take 1 tablet (750 mg total) by mouth 3 (three) times daily. 30 tablet 0  . Multiple Vitamin (MULTIVITAMIN WITH MINERALS) TABS tablet Take 1 tablet by mouth daily.    . Multiple Vitamins-Minerals (ZINC PO) Take 500 mg by mouth daily as needed (immune system boost).    . polyethylene glycol (MIRALAX / GLYCOLAX) packet Take 17 g by mouth daily as needed for moderate constipation or severe constipation. 14 each 0  . senna-docusate (SENOKOT-S) 8.6-50 MG tablet Take 1 tablet by mouth at bedtime as needed for mild constipation. 15 tablet 0  . sertraline (ZOLOFT) 100 MG tablet Take 1 tablet (100 mg total) by mouth daily. 30 tablet 3  . silver sulfADIAZINE (SILVADENE) 1 % cream Apply 1 application topically 2 (two) times daily. (Patient taking differently: Apply 1 application topically 2 (two) times daily as needed (wound  care). ) 50 g 0  . St Johns Wort 300 MG CAPS Take 900 mg by mouth 2 (two) times daily.     . traMADol (ULTRAM) 50 MG tablet Take by mouth 2 (two) times daily as needed (back pain).     No current facility-administered medications on file prior to visit.     Allergies  Allergen Reactions  . Benzalkonium Chloride Anaphylaxis and Hives  . Neosporin [Neomycin-Bacitracin Zn-Polymyx] Anaphylaxis and Hives  . Polysporin [Bacitracin-Polymyxin B] Hives  . Bee Venom Hives and Swelling    Throat swelling  . Bupropion Hcl Other (See Comments)    homicidal  . Cephalexin Hives and Swelling  . Latex     Past Medical History:  Diagnosis Date  . Asthma   .  Depression   . Family history of adverse reaction to anesthesia    FATHER WAS SLOW TO WAKE UP  . Headache    OCCASIONAL MIGRAINES  . HNP (herniated nucleus pulposus), lumbar   . Hx of epistaxis   . Insomnia   . OA (osteoarthritis)   . Obesity   . PCOS (polycystic ovarian syndrome)   . Sensitive skin   . Vitamin D deficiency     Past Surgical History:  Procedure Laterality Date  . LUMBAR LAMINECTOMY/DECOMPRESSION MICRODISCECTOMY Right 06/02/2014   Procedure: MICRO LUMBAR DECOMPRESSION L4 - L5 ON THE RIGHT 1 LEVEL;  Surgeon: Johnn Hai, MD;  Location: [facility];  Service: Orthopedics;  Laterality: Right;  . ORIF HUMERUS FRACTURE Left 08/01/2017   Procedure: OPEN REDUCTION INTERNAL FIXATION (ORIF) PROXIMAL HUMERUS FRACTURE;  Surgeon: Shona Needles, MD;  Location: [facility];  Service: Orthopedics;  Laterality: Left;  . WISDOM TOOTH EXTRACTION      Family History  Problem Relation Age of Onset  . Heart attack Father 61  . Breast cancer Maternal Aunt 90    Social History   Socioeconomic History  . Marital status: Single    Spouse name: Not on file  . Number of children: Not on file  . Years of education: Not on file  . Highest education level: Not on file  Occupational History  . Not on file  Social Needs  . Financial resource strain: Not on file  . Food insecurity:    Worry: Not on file    Inability: Not on file  . Transportation needs:    Medical: Not on file    Non-medical: Not on file  Tobacco Use  . Smoking status: Former Smoker    Last attempt to quit: 05/26/1990    Years since quitting: 27.6  . Smokeless tobacco: Never Used  Substance and Sexual Activity  . Alcohol use: No    Alcohol/week: 0.0 oz  . Drug use: No  . Sexual activity: Not on file  Lifestyle  . Physical activity:    Days per week: Not on file    Minutes per session: Not on file  . Stress: Not on file  Relationships  . Social connections:    Talks on phone: Not on file    Gets together:  Not on file    Attends religious service: Not on file    Active member of club or organization: Not on file    Attends meetings of clubs or organizations: Not on file    Relationship status: Not on file  . Intimate partner violence:    Fear of current or ex partner: Not on file    Emotionally abused: Not on file  Physically abused: Not on file    Forced sexual activity: Not on file  Other Topics Concern  . Not on file  Social History Narrative  . Not on file   The PMH, PSH, Social History, Family History, Medications, and allergies have been reviewed in [facility], and have been updated if relevant.  OBJECTIVE: BP 130/86 (BP Location: Right Arm, Patient Position: Sitting, Cuff Size: Normal)   Pulse 78   Temp 98.5 F (36.9 C) (Oral)   Ht 5' 6.5" (1.689 m)   Wt (!) 371 lb 12.8 oz (168.6 kg)   LMP 07/11/2016   SpO2 95%   BMI 59.11 kg/m   Patient appears to be in mild to moderate pain, antalgic gait noted. Lumbosacral spine area reveals no local tenderness or mass.  Painful and reduced LS ROM noted. Straight leg raise is positive at 40 degrees on left. DTR's, motor strength and sensation normal, including heel and toe gait.  Peripheral pulses are palpable. X-Ray: not indicated.  ASSESSMENT:  degenerative disc disease without herniated disc, herniated disc likely at L4-5 and with radiculopathy  PLAN: For acute pain, rest, intermittent application of heat (do not sleep on heating pad),short course of prednisone.  Given back history, will refer to ortho- referral placed.  Likely needs imaging and epidural injections. Call or return to clinic prn if these symptoms worsen or fail to improve as anticipated.

## 2018-01-07 ENCOUNTER — Ambulatory Visit: Payer: BLUE CROSS/BLUE SHIELD | Admitting: Physical Therapy

## 2018-01-07 ENCOUNTER — Encounter: Payer: Self-pay | Admitting: Physical Therapy

## 2018-01-07 DIAGNOSIS — M25612 Stiffness of left shoulder, not elsewhere classified: Secondary | ICD-10-CM

## 2018-01-07 DIAGNOSIS — M25512 Pain in left shoulder: Secondary | ICD-10-CM

## 2018-01-07 DIAGNOSIS — M6281 Muscle weakness (generalized): Secondary | ICD-10-CM

## 2018-01-07 DIAGNOSIS — M62838 Other muscle spasm: Secondary | ICD-10-CM

## 2018-01-07 NOTE — Therapy (Addendum)
[facility] [address], [place], [address] Phone: [phone]   Fax:  [phone]  Physical Therapy Treatment  Patient Details  Name: Danielle Irwin MRN: [id number] Date of Birth: [birth year] Referring Provider: Katha Hamming MD   Encounter Date: 01/07/2018  PT End of Session - 01/07/18 1436    Visit Number  34    Number of Visits  44    Date for PT Re-Evaluation  01/29/18    Authorization Type  6 of 6 FOTO    PT Start Time  1432    PT Stop Time  1530    PT Time Calculation (min)  58 min    Activity Tolerance  Patient tolerated treatment well;Patient limited by pain    Behavior During Therapy  [facility] for tasks assessed/performed       Past Medical History:  Diagnosis Date  . Asthma   . Depression   . Family history of adverse reaction to anesthesia    FATHER WAS SLOW TO WAKE UP  . Headache    OCCASIONAL MIGRAINES  . HNP (herniated nucleus pulposus), lumbar   . Hx of epistaxis   . Insomnia   . OA (osteoarthritis)   . Obesity   . PCOS (polycystic ovarian syndrome)   . Sensitive skin   . Vitamin D deficiency     Past Surgical History:  Procedure Laterality Date  . LUMBAR LAMINECTOMY/DECOMPRESSION MICRODISCECTOMY Right 06/02/2014   Procedure: MICRO LUMBAR DECOMPRESSION L4 - L5 ON THE RIGHT 1 LEVEL;  Surgeon: Johnn Hai, MD;  [facility]: [facility];  Service: Orthopedics;  Laterality: Right;  . ORIF HUMERUS FRACTURE Left 08/01/2017   Procedure: OPEN REDUCTION INTERNAL FIXATION (ORIF) PROXIMAL HUMERUS FRACTURE;  Surgeon: Shona Needles, MD;  Location: [facility];  Service: Orthopedics;  Laterality: Left;  . WISDOM TOOTH EXTRACTION      There were no vitals filed for this visit.  Subjective Assessment - 01/07/18 1434    Subjective  Patient continues with lower back symptoms and is going to see MD in 2 weeks. Her left UE is doing better.     Pertinent History  Patient reports she fell into a storm door at  neighbors after stubbing toe on step 07/30/17. She felt immediate pain and numbness left UE and she could not move her left UE. She was transported to ED and then transported to [facility] in [place] and underwent surgery with ORIF 08/01/17 and released to home 08/03/17.     Limitations  Lifting;Walking;House hold activities out of work; personal care; staying at parent;s due to limitations    Patient Stated Goals  to be able to use her arm again for personal care, return to all ADL's with minimal difficutly    Currently in Pain?  Yes    Pain Score  3     Pain Location  Shoulder    Pain Orientation  Left;Anterior    Pain Descriptors / Indicators  Aching;Sore    Pain Type  Surgical pain 08/01/2017    Pain Onset  More than a month ago    Pain Frequency  Intermittent           Objective: Palpation: hypersensitive left UE anterior aspect of shoulder/pectoral muslces and mid upper arm lateral to incision  Outcome measure: FOTO 43/100: previous measurement 12/10/17: 38/100   Treatment:  Manual therapy: 5 min STM performed to anterior aspect left shoulder over pectoral region superficial techniques; patient with decreased  tenderness and improved soft tissue mobility   Therapeutic exercise:  performed with assistance, verbal and tactile cues of therapist:  Supine with head of table elevated: AAROM left shoulder 10 reps: up to 110+ degrees  flexion; pain limited further motion AAROM left shoulder abduction  x 10 to 90 degrees isometric shoulder ER multi angle to facilitate increased ROM x 10 reps up to 40 degrees isometric shoulder IR multi angle to facilitate increased ROM x 10 reps up to 60 degrees 3# weight for shoulder press left UE x 10 reps with assistance as needed   Modalities: Electrical stimulation (in conjunction with exercise/iontophoresis): 20 FVC:BSWHQPR stim. 10/10 cycle applied (2) electrodes to left shoulder periscapular muscles rhomboids/lower trapezius, intensity to  tolerance, with patient reclined with left UE supported on pillow: muscle re education/pain: no adverse reactions noted   Iontophoresis with dexamethasone 4mg /ml @ 40 ma*min applied medium electrodes to lateral aspect left shoulder/upper arm and anterior aspect of shoulder over tender area (13 min application): no adverse reaction noted: goal: inflammation/pain   Patient response to treatment: limited forward elevation due to pain. improved following iontophoresis, no adverse effects noted with treatment. Modified exercises due to pain      PT Education - 01/07/18 1435    Education provided  Yes    Education Details  HEP re assessed     Person(s) Educated  Patient    Methods  Demonstration;Verbal cues;Explanation    Comprehension  Verbalized understanding;Returned demonstration;Verbal cues required          PT Long Term Goals - 12/18/17 1028      PT LONG TERM GOAL #1   Title  Patient will improve FOTO score to 25/100 demonstrating improvement with functional use left UE for daily tasks    Baseline  FOTO 4/100; 09/04/17 4/100 no change due to severity of injury/surgery and continued restrictions on use/movement; 11/10/17 26/100    Status  Achieved      PT LONG TERM GOAL #2   Title  Patient will improve FOTO score to 50/100 demonstrating improvement with functional use left UE for daily tasks    Baseline  FOTO 4/100; 12/10/2017 38/100    Status  On-going    Target Date  01/29/18      PT LONG TERM GOAL #3   Title  patient will be independent with home exercises for flexibility and strength to allow transition to self management once discharged from physical therapy    Baseline  requires assistance for ROM left shoulder, guidance and cuing to perform all exercises    Status  On-going    Target Date  01/29/18            Plan - 01/07/18 1530    Clinical Impression Statement  Patient demonstrates steady progress with improvement noted in left shoulder/UE with ROM and strength. She  continues with anterior right shoulder pain that limits overhead motion and activity and she will benefit from continued physical therapy intervention to improve function.     Rehab Potential  Good    Clinical Impairments Affecting Rehab Potential  (+) motivated, acute condition (-)obesity, DDD, lumbar     PT Frequency  2x / week    PT Duration  6 weeks    PT Treatment/Interventions  Manual techniques;Neuromuscular re-education;Patient/family education;Cryotherapy;Electrical Stimulation;Moist Heat;Therapeutic exercise;Therapeutic activities;Balance training;Scar mobilization;Passive range of motion;Iontophoresis 4mg /ml Dexamethasone    PT Next Visit Plan  pain control, progressive exercises PROM/AAROM shoulder and AROM elbow to hand to improve control/strength left UE; iontophoresis  4mg /ml Dexamethasone    PT Home Exercise Plan  pendulum exercise for shoulder; AAROM left shoulder supine, isometric hold at 90 degrees elevation 90 degrees; active shoulder forward elevation and abduction, ER right shoulder       Patient will benefit from skilled therapeutic intervention in order to improve the following deficits and impairments:  Pain, Impaired UE functional use, Impaired perceived functional ability, Decreased strength, Decreased range of motion, Decreased endurance, Decreased activity tolerance, Decreased balance, Obesity  Visit Diagnosis: Stiffness of left shoulder, not elsewhere classified  Acute pain of left shoulder  Muscle weakness (generalized)  Other muscle spasm     Problem List Patient Active Problem List   Diagnosis Date Noted  . DDD (degenerative disc disease), thoracolumbar 01/06/2018  . DVT (deep venous thrombosis) ([place]) 08/29/2017  . Leukocytosis 08/06/2017  . Humerus fracture 08/01/2017  . CPAP (continuous positive airway pressure) dependence 08/01/2017  . Asthma 08/01/2017  . HTN (hypertension) 06/24/2017  . Depression 11/07/2015  . Systolic murmur 89/21/1941  .  Morbid obesity ([place]) 10/26/2014  . Insomnia 06/21/2014  . Anxiety 06/21/2014  . Spinal stenosis at L4-L5 level 06/02/2014  . POLYCYSTIC OVARIAN DISEASE 06/19/2010    Jomarie Longs PT 01/08/2018, 1:27 PM  [place] [address] [place], [address] Phone: [phone]   Fax:  [phone]  Name: Danielle Irwin MRN: [id number] Date of Birth: July 10, 1970

## 2018-01-12 ENCOUNTER — Encounter: Payer: Self-pay | Admitting: Emergency Medicine

## 2018-01-12 ENCOUNTER — Emergency Department
Admission: EM | Admit: 2018-01-12 | Discharge: 2018-01-12 | Disposition: A | Discharge status: Home / Self Care | Payer: BLUE CROSS/BLUE SHIELD | Attending: Emergency Medicine | Admitting: Emergency Medicine

## 2018-01-12 ENCOUNTER — Emergency Department: Payer: BLUE CROSS/BLUE SHIELD

## 2018-01-12 ENCOUNTER — Ambulatory Visit: Payer: BLUE CROSS/BLUE SHIELD | Admitting: Physical Therapy

## 2018-01-12 ENCOUNTER — Other Ambulatory Visit: Payer: Self-pay

## 2018-01-12 DIAGNOSIS — M5432 Sciatica, left side: Secondary | ICD-10-CM

## 2018-01-12 DIAGNOSIS — J45909 Unspecified asthma, uncomplicated: Secondary | ICD-10-CM | POA: Diagnosis not present

## 2018-01-12 DIAGNOSIS — M5136 Other intervertebral disc degeneration, lumbar region: Secondary | ICD-10-CM

## 2018-01-12 DIAGNOSIS — M545 Low back pain: Secondary | ICD-10-CM | POA: Diagnosis present

## 2018-01-12 DIAGNOSIS — Z91040 Latex allergy status: Secondary | ICD-10-CM | POA: Diagnosis not present

## 2018-01-12 DIAGNOSIS — Z87891 Personal history of nicotine dependence: Secondary | ICD-10-CM | POA: Diagnosis not present

## 2018-01-12 DIAGNOSIS — M5126 Other intervertebral disc displacement, lumbar region: Secondary | ICD-10-CM | POA: Insufficient documentation

## 2018-01-12 DIAGNOSIS — Z7982 Long term (current) use of aspirin: Secondary | ICD-10-CM | POA: Insufficient documentation

## 2018-01-12 DIAGNOSIS — Z79899 Other long term (current) drug therapy: Secondary | ICD-10-CM | POA: Insufficient documentation

## 2018-01-12 LAB — URINALYSIS, COMPLETE (UACMP) WITH MICROSCOPIC
BILIRUBIN URINE: NEGATIVE
Glucose, UA: NEGATIVE mg/dL
Hgb urine dipstick: NEGATIVE
KETONES UR: NEGATIVE mg/dL
LEUKOCYTES UA: NEGATIVE
Nitrite: NEGATIVE
PH: 6 (ref 5.0–8.0)
Protein, ur: NEGATIVE mg/dL
Specific Gravity, Urine: 1.02 (ref 1.005–1.030)

## 2018-01-12 LAB — PREGNANCY, URINE: PREG TEST UR: NEGATIVE

## 2018-01-12 MED ORDER — KETOROLAC TROMETHAMINE 30 MG/ML IJ SOLN
30.0000 mg | Freq: Once | INTRAMUSCULAR | Status: AC
  Administered 2018-01-12: 30 mg via INTRAMUSCULAR
  Filled 2018-01-12: qty 1

## 2018-01-12 MED ORDER — DEXAMETHASONE SODIUM PHOSPHATE 10 MG/ML IJ SOLN
10.0000 mg | Freq: Once | INTRAMUSCULAR | Status: AC
  Administered 2018-01-12: 10 mg via INTRAMUSCULAR
  Filled 2018-01-12: qty 1

## 2018-01-12 MED ORDER — MORPHINE SULFATE (PF) 4 MG/ML IV SOLN
4.0000 mg | Freq: Once | INTRAVENOUS | Status: AC
  Administered 2018-01-12: 4 mg via INTRAMUSCULAR
  Filled 2018-01-12: qty 1

## 2018-01-12 MED ORDER — METAXALONE 800 MG PO TABS: 800.0000 mg | ORAL_TABLET | Freq: Three times a day (TID) | ORAL | 0 refills | Status: DC

## 2018-01-12 MED ORDER — ORPHENADRINE CITRATE 30 MG/ML IJ SOLN
60.0000 mg | Freq: Once | INTRAMUSCULAR | Status: AC
  Administered 2018-01-12: 60 mg via INTRAMUSCULAR
  Filled 2018-01-12: qty 2

## 2018-01-12 MED ORDER — HYDROCODONE-ACETAMINOPHEN 5-325 MG PO TABS
1.0000 | ORAL_TABLET | ORAL | 0 refills | Status: DC | PRN
Start: 2018-01-12 — End: 2018-05-12

## 2018-01-12 MED ORDER — OXYCODONE-ACETAMINOPHEN 5-325 MG PO TABS
1.0000 | ORAL_TABLET | Freq: Once | ORAL | Status: AC
  Administered 2018-01-12: 1 via ORAL
  Filled 2018-01-12: qty 1

## 2018-01-12 MED ORDER — PREDNISONE 10 MG PO TABS: 10.0000 mg | ORAL_TABLET | Freq: Every day | ORAL | 0 refills | Status: DC

## 2018-01-12 MED ORDER — MELOXICAM 15 MG PO TABS: 15.0000 mg | ORAL_TABLET | Freq: Every day | ORAL | 0 refills | Status: DC

## 2018-01-12 NOTE — ED Provider Notes (Signed)
[facility] Emergency Department Provider Note  ____________________________________________  Time seen: Approximately 3:36 PM  I have reviewed the triage vital signs and the nursing notes.   HISTORY  Chief Complaint Back Pain    HPI Danielle Irwin is a 47 y.o. female who presents the emergency department for acute on chronic lower back pain with increasing radicular symptoms down the left lower extremity.  Patient reports that she has a history of bulging disc at L4-L5 region as well as L5-S1 region.  Patient reports that she is switching to a new orthopedic surgeon and has an appointment next week.  She reports that sometimes steroids have alleviated symptoms and she is looking for relief until she can see orthopedics.  Patient has taken 1 oxycodone, tramadol, Robaxin, Tylenol for this pain without significant relief.  No bowel or bladder dysfunction, saddle anesthesia or paresthesias.  No recent trauma.  No urinary symptoms.  No other complaints at this time.    Past Medical History:  Diagnosis Date  . Asthma   . Depression   . Family history of adverse reaction to anesthesia    FATHER WAS SLOW TO WAKE UP  . Headache    OCCASIONAL MIGRAINES  . HNP (herniated nucleus pulposus), lumbar   . Hx of epistaxis   . Insomnia   . OA (osteoarthritis)   . Obesity   . PCOS (polycystic ovarian syndrome)   . Sensitive skin   . Vitamin D deficiency     Patient Active Problem List   Diagnosis Date Noted  . DDD (degenerative disc disease), thoracolumbar 01/06/2018  . DVT (deep venous thrombosis) ([place]) 08/29/2017  . Leukocytosis 08/06/2017  . Humerus fracture 08/01/2017  . CPAP (continuous positive airway pressure) dependence 08/01/2017  . Asthma 08/01/2017  . HTN (hypertension) 06/24/2017  . Depression 11/07/2015  . Systolic murmur 16/06/930  . Morbid obesity ([place]) 10/26/2014  . Insomnia 06/21/2014  . Anxiety 06/21/2014  . Spinal stenosis at L4-L5 level  06/02/2014  . POLYCYSTIC OVARIAN DISEASE 06/19/2010    Past Surgical History:  Procedure Laterality Date  . LUMBAR LAMINECTOMY/DECOMPRESSION MICRODISCECTOMY Right 06/02/2014   Procedure: MICRO LUMBAR DECOMPRESSION L4 - L5 ON THE RIGHT 1 LEVEL;  Surgeon: Johnn Hai, MD;  [facility];  Service: Orthopedics;  Laterality: Right;  . ORIF HUMERUS FRACTURE Left 08/01/2017   Procedure: OPEN REDUCTION INTERNAL FIXATION (ORIF) PROXIMAL HUMERUS FRACTURE;  Surgeon: Shona Needles, MD;  Location: [facility];  Service: Orthopedics;  Laterality: Left;  . WISDOM TOOTH EXTRACTION      Prior to Admission medications   Medication Sig Start Date End Date Taking? Authorizing Provider  albuterol (VENTOLIN HFA) 108 (90 Base) MCG/ACT inhaler Inhale 2 puffs into the lungs every 6 (six) hours as needed. Patient taking differently: Inhale 2 puffs into the lungs every 6 (six) hours as needed for wheezing or shortness of breath.  06/24/17   Lucille Passy, MD  Alum & Mag Hydroxide-Simeth (ANTACID ANTI-GAS PO) Take 1 tablet by mouth daily as needed (for gas).    [provider]  Ascorbic Acid (VITAMIN C) 1000 MG tablet Take 1,000 mg by mouth 2 (two) times daily.    [provider]  aspirin EC 81 MG tablet Take 81 mg by mouth daily.    [provider]  aspirin-acetaminophen-caffeine (EXCEDRIN MIGRAINE) [phone] MG tablet Take 2 tablets by mouth 2 (two) times daily as needed for headache or migraine.    [provider]  b complex vitamins  tablet Take 1 tablet by mouth daily.    [provider]  Calcium Carbonate-Vitamin D (CALCIUM-D PO) Take 1,200 mg by mouth 2 (two) times daily.    [provider]  Cholecalciferol (VITAMIN D-3) 5000 units TABS Take 5,000 Units by mouth 2 (two) times daily.    [provider]  diphenhydrAMINE (BENADRYL) 25 MG tablet Take 25-50 mg by mouth every 6 (six) hours as needed for itching, allergies or sleep.    [provider]  docusate sodium (COLACE) 100 MG capsule Take 1 capsule (100 mg total) by mouth 2 (two) times daily as needed for mild constipation. 06/02/14   Susa Day, MD  [facility] SEAL PO Take 1 tablet by mouth 2 (two) times daily as needed (immune system boost).     [provider]  hydrochlorothiazide (HYDRODIURIL) 25 MG tablet Take 1 tablet (25 mg total) by mouth daily. Patient taking differently: Take 25-50 mg by mouth daily.  12/24/16   Lucille Passy, MD  HYDROcodone-acetaminophen (NORCO/VICODIN) 5-325 MG tablet Take 1 tablet by mouth every 4 (four) hours as needed for moderate pain. 01/12/18   Duc Crocket, Charline Bills, PA-C  lisinopril (PRINIVIL,ZESTRIL) 10 MG tablet Take 1 tablet (10 mg total) by mouth daily. Patient taking differently: Take 9 mg by mouth at bedtime.  06/24/17   Lucille Passy, MD  Magnesium 500 MG TABS Take 500 mg by mouth at bedtime.    [provider]  Melatonin 10 MG TABS Take 1 tablet by mouth at bedtime.     [provider]  meloxicam (MOBIC) 15 MG tablet Take 1 tablet (15 mg total) by mouth daily. 01/12/18   Nicholle Falzon, Charline Bills, PA-C  metaxalone (SKELAXIN) 800 MG tablet Take 1 tablet (800 mg total) by mouth 3 (three) times daily. 01/12/18 01/12/19  Kierria Feigenbaum, Charline Bills, PA-C  methocarbamol (ROBAXIN) 750 MG tablet Take 1 tablet (750 mg total) by mouth every 8 (eight) hours as needed for muscle spasms. 08/02/17   Haddix, Thomasene Lot, MD  methocarbamol (ROBAXIN) 750 MG tablet Take 1 tablet (750 mg total) by mouth 3 (three) times daily. 09/19/17   Menshew, Dannielle Karvonen, PA-C  Multiple Vitamin (MULTIVITAMIN WITH MINERALS) TABS tablet Take 1 tablet by mouth daily.    [provider]  Multiple Vitamins-Minerals (ZINC PO) Take 500 mg by mouth daily as needed (immune system boost).    [provider]  mupirocin ointment (BACTROBAN) 2 % Apply 1 application topically 2 (two) times daily. To affected area 01/06/18   Lucille Passy, MD   polyethylene glycol [facility] / Floria Raveling) packet Take 17 g by mouth daily as needed for moderate constipation or severe constipation. 08/03/17   Alma Friendly, MD  predniSONE (DELTASONE) 10 MG tablet Take 1 tablet (10 mg total) by mouth daily. 01/12/18   Lacreasha Hinds, Charline Bills, PA-C  senna-docusate (SENOKOT-S) 8.6-50 MG tablet Take 1 tablet by mouth at bedtime as needed for mild constipation. 08/03/17   Alma Friendly, MD  sertraline (ZOLOFT) 100 MG tablet Take 1 tablet (100 mg total) by mouth daily. 10/29/17   Lucille Passy, MD  silver sulfADIAZINE (SILVADENE) 1 % cream Apply 1 application topically 2 (two) times daily. Patient taking differently: Apply 1 application topically 2 (two) times daily as needed (wound care).  11/23/14   Pleas Koch, NP  [place] Wort 300 MG CAPS Take 900 mg by mouth 2 (two) times daily.     [provider]  traMADol Veatrice Bourbon)  50 MG tablet Take by mouth 2 (two) times daily as needed (back pain).    [provider]    Allergies Benzalkonium chloride; Neosporin [neomycin-bacitracin zn-polymyx]; Polysporin [bacitracin-polymyxin b]; Bee venom; Bupropion hcl; Cephalexin; and Latex  Family History  Problem Relation Age of Onset  . Heart attack Father 36  . Breast cancer Maternal Aunt 30    Social History Social History   Tobacco Use  . Smoking status: Former Smoker    Last attempt to quit: 05/26/1990    Years since quitting: 27.6  . Smokeless tobacco: Never Used  Substance Use Topics  . Alcohol use: No    Alcohol/week: 0.0 oz  . Drug use: No     Review of Systems  Constitutional: No fever/chills Eyes: No visual changes. No discharge ENT: No upper respiratory complaints. Cardiovascular: no chest pain. Respiratory: no cough. No SOB. Gastrointestinal: No abdominal pain.  No nausea, no vomiting.  No diarrhea.  No constipation. Genitourinary: Negative for dysuria. No hematuria Musculoskeletal: Acute on chronic lower back pain  with radicular symptoms down the left lower extremity Skin: Negative for rash, abrasions, lacerations, ecchymosis. Neurological: Negative for headaches, focal weakness or numbness. 10-point ROS otherwise negative.  ____________________________________________   PHYSICAL EXAM:  VITAL SIGNS: ED Triage Vitals  Enc Vitals Group     BP 01/12/18 1420 (!) 153/91     Pulse Rate 01/12/18 1420 92     Resp 01/12/18 1420 18     Temp 01/12/18 1420 98 F (36.7 C)     Temp Source 01/12/18 1420 Oral     SpO2 01/12/18 1420 95 %     Weight 01/12/18 1420 (!) 371 lb (168.3 kg)     Height 01/12/18 1420 5\' 6"  (1.676 m)     Head Circumference --      Peak Flow --      Pain Score 01/12/18 1419 10     Pain Loc --      Pain Edu? --      Excl. in [place]? --      Constitutional: Alert and oriented. Well appearing and in no acute distress. Eyes: Conjunctivae are normal. PERRL. EOMI. Head: Atraumatic. Neck: No stridor.    Cardiovascular: Normal rate, regular rhythm. Normal S1 and S2.  Good peripheral circulation. Respiratory: Normal respiratory effort without tachypnea or retractions. Lungs CTAB. Good air entry to the bases with no decreased or absent breath sounds. Gastrointestinal: Bowel sounds 4 quadrants. Soft and nontender to palpation. No guarding or rigidity. No palpable masses. No distention. No CVA tenderness. Musculoskeletal: Full range of motion to all extremities. No gross deformities appreciated.  Sensation of the lumbar spine reveals no or abnormality.  Patient is very tender to palpation over the L3-S1 region.  No palpable abnormality or step-off.  Patient is exquisitely tender to palpation over the left-sided sciatic notch.  No other tenderness to palpation.  Examination of the left hip, left knee, left ankle is unremarkable.  Dorsalis pedis pulse intact bilateral lower extremity's.  Sensation intact and equal in all dermatomal distributions bilateral lower extremities.  Positive straight leg  raise left side. Neurologic:  Normal speech and language. No gross focal neurologic deficits are appreciated.  Skin:  Skin is warm, dry and intact. No rash noted. Psychiatric: Mood and affect are normal. Speech and behavior are normal. Patient exhibits appropriate insight and judgement.   ____________________________________________   LABS (all labs ordered are listed, but only abnormal results are displayed)  Labs Reviewed  URINALYSIS, COMPLETE (UACMP)  WITH MICROSCOPIC - Abnormal; Notable for the following components:      Result Value   Color, Urine YELLOW (*)    APPearance CLEAR (*)    Bacteria, UA RARE (*)    All other components within normal limits  PREGNANCY, URINE   ____________________________________________  EKG   ____________________________________________  RADIOLOGY I personally viewed and evaluated these images as part of my medical decision making, as well as reviewing the written report by the radiologist.  Concur with radiologist of no acute osseous abnormality.  Compared to previous imaging from several years ago, no significant changes.  Multilevel degenerative changes are also appreciated.  Dg Lumbar Spine Complete  Result Date: 01/12/2018 CLINICAL DATA:  Acute low back pain radiating down LEFT leg for 3 days. Initial encounter. EXAM: LUMBAR SPINE - COMPLETE 4+ VIEW COMPARISON:  None. FINDINGS: Five non rib-bearing lumbar type vertebra are identified. No acute fracture or subluxation. Mild multilevel degenerative disc disease and spondylosis noted. No focal bony lesions or spondylolysis identified. IMPRESSION: 1. No acute abnormality 2. Mild multilevel degenerative changes. Electronically Signed   By: Margarette Canada M.D.   On: 01/12/2018 18:12    ____________________________________________    PROCEDURES  Procedure(s) performed:    Procedures    Medications  ketorolac (TORADOL) 30 MG/ML injection 30 mg (has no administration in time range)   orphenadrine (NORFLEX) injection 60 mg (has no administration in time range)  dexamethasone (DECADRON) injection 10 mg (has no administration in time range)  morphine 4 MG/ML injection 4 mg (has no administration in time range)  oxyCODONE-acetaminophen (PERCOCET/ROXICET) 5-325 MG per tablet 1 tablet (1 tablet Oral Given 01/12/18 1543)     ____________________________________________   INITIAL IMPRESSION / ASSESSMENT AND PLAN / ED COURSE  Pertinent labs & imaging results that were available during my care of the patient were reviewed by me and considered in my medical decision making (see chart for details).  Review of the [place] CSRS was performed in accordance of the [place] prior to dispensing any controlled drugs.      Patient's diagnosis is consistent with ruptured disc causing left-sided sciatica.  Patient presents with known ruptured disc to the lumbar spine.  No recent trauma.  Patient has had a gradual worsening of her back pain.  No concerning findings with bowel or bladder dysfunction, saddle anesthesia or paresthesias.  X-ray reveals no acute changes from previous imaging.  Patient is given multiple injections for symptom relief.. Patient will be discharged home with prescriptions for meloxicam, steroid taper, muscle relaxer, very limited Norco.. Patient is to follow up with neurosurgery as needed or otherwise directed. Patient is given ED precautions to return to the ED for any worsening or new symptoms.     ____________________________________________  FINAL CLINICAL IMPRESSION(S) / ED DIAGNOSES  Final diagnoses:  Bulging lumbar disc  Sciatica of left side      NEW MEDICATIONS STARTED DURING THIS VISIT:  ED Discharge Orders        Ordered    meloxicam (MOBIC) 15 MG tablet  Daily     01/12/18 1912    metaxalone (SKELAXIN) 800 MG tablet  3 times daily     01/12/18 1912    predniSONE (DELTASONE) 10 MG tablet  Daily    Note to Pharmacy:  Take 6 pills x 2 days, 5 pills x 2  days, 4 pills x 2 days, 3 pills x 2 days, 2 pills x 2 days, and 1 pill x 2 days   01/12/18 1912  HYDROcodone-acetaminophen (NORCO/VICODIN) 5-325 MG tablet  Every 4 hours PRN     01/12/18 1912          This chart was dictated using voice recognition software/Dragon. Despite best efforts to proofread, errors can occur which can change the meaning. Any change was purely unintentional.    Darletta Moll, PA-C 01/12/18 1917    Hinda Kehr, MD 01/12/18 [phone]

## 2018-01-12 NOTE — ED Notes (Signed)
.  FIRST NURSE NOTE: Pt presents for back pain and weakness. Pt states"im gonna pass outInsurance claims handler offered and pt declined, stating "I just need a bed to lay down in." Pt was informed there are not avaiable beds at this time.

## 2018-01-12 NOTE — ED Notes (Signed)
See triage note  Presents with family with lower back pain   States pain is mainly to left side and is moving into left leg    Pain has increased since Saturday   States she took a tramodol and robaxin about 3 am w/o relief  Has appt with ortho on the 13 th of this month

## 2018-01-12 NOTE — ED Triage Notes (Signed)
Patient reports history of degenerative disc disease and bulging discs. Reports taking road trip in June which aggravated back and was started on prednisone 1 week ago with some improvement but had sudden sharp pain on Saturday and has been in pain since. Patient scheduled to see ortho in one week.

## 2018-01-14 ENCOUNTER — Ambulatory Visit: Payer: BLUE CROSS/BLUE SHIELD | Admitting: Physical Therapy

## 2018-01-19 ENCOUNTER — Telehealth (INDEPENDENT_AMBULATORY_CARE_PROVIDER_SITE_OTHER): Payer: Self-pay | Admitting: Orthopaedic Surgery

## 2018-01-19 ENCOUNTER — Ambulatory Visit: Payer: BLUE CROSS/BLUE SHIELD | Admitting: Physical Therapy

## 2018-01-19 NOTE — Telephone Encounter (Signed)
Records from [facility]. Tried to call patient to advise but mailbox full, unable to lve msg.

## 2018-01-20 ENCOUNTER — Encounter (INDEPENDENT_AMBULATORY_CARE_PROVIDER_SITE_OTHER): Payer: Self-pay | Admitting: Orthopaedic Surgery

## 2018-01-20 ENCOUNTER — Ambulatory Visit (INDEPENDENT_AMBULATORY_CARE_PROVIDER_SITE_OTHER): Payer: BLUE CROSS/BLUE SHIELD | Admitting: Orthopaedic Surgery

## 2018-01-20 VITALS — BP 145/89 | HR 71 | Ht 66.5 in | Wt 370.0 lb

## 2018-01-20 DIAGNOSIS — M545 Low back pain: Secondary | ICD-10-CM | POA: Diagnosis not present

## 2018-01-20 NOTE — Progress Notes (Signed)
Office Visit Note   Patient: Danielle Irwin           Date of Birth: [birth year]           MRN: [id number] Visit Date: 01/20/2018              Requested by: Lucille Passy, MD [place], [place] [address] PCP: Lucille Passy, MD   Assessment & Plan: Visit Diagnoses:  1. Low back pain, unspecified back pain laterality, unspecified chronicity, with sciatica presence unspecified     Plan: Prescription given for Norco 5/325 number 12 tablets 1-2 p.o. every 8 as needed.  Work slip given no work x2 weeks she is scheduled to see Dr. Doreatha Martin about her shoulder later today.  She is happy with the progress her shoulder is made with therapy.  We discussed water aerobics possible visit for bariatric seminar class in case she might be interested in this.  I plan to recheck her in 2 weeks is she having persistent problems we may have to consider diagnostic imaging which would have to be an open MRI scanner for lumbar MRI with and without contrast.  Follow-Up Instructions: Return in about 2 weeks (around 02/03/2018).   Orders:  No orders of the defined types were placed in this encounter.  No orders of the defined types were placed in this encounter.     Procedures: No procedures performed   Clinical Data: No additional findings.   Subjective: Chief Complaint  Patient presents with  . Lower Back - Pain    HPI 47 year old female seen here with low back pain she been seen and treated by Dr. Tonita Cong in the past and apparently her insurance has him out of network.  She had a fall with a left proximal humerus fracture was seen at [facility] and referred to Dr. Doreatha Martin who did shoulder surgery on her and February 2019.  She does have history of calf DVT after shoulder surgery.  DVT was in the distal popliteal vein of the right leg.  Recently she has had increased back pain left leg pain after she took a trip to [place] she states she normally has had some symptoms like this stayed in bed  for couple days and then it got better but this time the pains been significantly worse.  Previous L4-5 surgery by Dr. Tonita Cong in December 2015.  She states this is the worst the pain is been.  She is to have problems with morbid obesity and narcotic review shows her [place] drug score is narcotics  290 and sedatives  140.  Patient recently with increased back and left leg pain thought she might have a blood clot started taking Eliquis again which she had some leftover.  She also has been on Mobic took prednisone.  She takes medicine for blood pressure and has some tramadol left states she has no Vicodin left.  Associated bowel or bladder problems she does have increased pain when she coughs or sneezes.  Review of Systems past for migraines history of mono, pneumonia systolic murmur, lumbar spinal stenosis, fibromyalgia, sleep apnea, morbid obesity, anxiety, asthma, DVT right distal peroneal vein, bronchitis, depression, hypertension otherwise negative as pertains HPI.   Objective: Vital Signs: BP (!) 145/89   Pulse 71   Ht 5' 6.5" (1.689 m)   Wt (!) 370 lb (167.8 kg)   BMI 58.82 kg/m   Physical Exam  Constitutional: She is oriented to person, place, and time. She appears well-developed.  HENT:  Head: Normocephalic.  Right Ear: External ear normal.  Left Ear: External ear normal.  Eyes: Pupils are equal, round, and reactive to light.  Neck: No tracheal deviation present. No thyromegaly present.  Cardiovascular: Normal rate.  Pulmonary/Chest: Effort normal.  Abdominal: Soft.  Neurological: She is alert and oriented to person, place, and time.  Skin: Skin is warm and dry.  Psychiatric: She has a normal mood and affect. Her behavior is normal.    Ortho Exam patient has some pain with straight leg raising on the left anterior tib gastrocsoleus is intact.  She is amatory with a cane.  Well-healed incision over her left anterior shoulder from ORIF proximal humerus she is able to get her arm up above  her head.  She has trace lower extremity edema.  No cellulitis is present.  Pain with popliteal compression.  Well-healed old lumbar incision at the L4-5 level.  Specialty Comments:  No specialty comments available.  Imaging: No results found.   PMFS History: Patient Active Problem List   Diagnosis Date Noted  . DDD (degenerative disc disease), thoracolumbar 01/06/2018  . DVT (deep venous thrombosis) ([place]) 08/29/2017  . Leukocytosis 08/06/2017  . Humerus fracture 08/01/2017  . CPAP (continuous positive airway pressure) dependence 08/01/2017  . Asthma 08/01/2017  . HTN (hypertension) 06/24/2017  . Depression 11/07/2015  . Systolic murmur 97/41/6384  . Morbid obesity ([place]) 10/26/2014  . Insomnia 06/21/2014  . Anxiety 06/21/2014  . Spinal stenosis at L4-L5 level 06/02/2014  . POLYCYSTIC OVARIAN DISEASE 06/19/2010   Past Medical History:  Diagnosis Date  . Asthma   . Depression   . Family history of adverse reaction to anesthesia    FATHER WAS SLOW TO WAKE UP  . Headache    OCCASIONAL MIGRAINES  . HNP (herniated nucleus pulposus), lumbar   . Hx of epistaxis   . Insomnia   . OA (osteoarthritis)   . Obesity   . PCOS (polycystic ovarian syndrome)   . Sensitive skin   . Vitamin D deficiency     Family History  Problem Relation Age of Onset  . Heart attack Father 42  . Breast cancer Maternal Aunt 71    Past Surgical History:  Procedure Laterality Date  . LUMBAR LAMINECTOMY/DECOMPRESSION MICRODISCECTOMY Right 06/02/2014   Procedure: MICRO LUMBAR DECOMPRESSION L4 - L5 ON THE RIGHT 1 LEVEL;  Surgeon: Johnn Hai, MD;  Location: [facility];  Service: Orthopedics;  Laterality: Right;  . ORIF HUMERUS FRACTURE Left 08/01/2017   Procedure: OPEN REDUCTION INTERNAL FIXATION (ORIF) PROXIMAL HUMERUS FRACTURE;  Surgeon: Shona Needles, MD;  Location: [facility];  Service: Orthopedics;  Laterality: Left;  . WISDOM TOOTH EXTRACTION     Social History   Occupational History  . Not on  file  Tobacco Use  . Smoking status: Former Smoker    Last attempt to quit: 05/26/1990    Years since quitting: 27.6  . Smokeless tobacco: Never Used  Substance and Sexual Activity  . Alcohol use: No    Alcohol/week: 0.0 standard drinks  . Drug use: No  . Sexual activity: Not on file

## 2018-01-21 ENCOUNTER — Encounter: Payer: Self-pay | Admitting: Physical Therapy

## 2018-01-21 ENCOUNTER — Ambulatory Visit: Payer: BLUE CROSS/BLUE SHIELD | Attending: Student | Admitting: Physical Therapy

## 2018-01-21 DIAGNOSIS — G8929 Other chronic pain: Secondary | ICD-10-CM | POA: Diagnosis present

## 2018-01-21 DIAGNOSIS — M25612 Stiffness of left shoulder, not elsewhere classified: Secondary | ICD-10-CM | POA: Diagnosis present

## 2018-01-21 DIAGNOSIS — M25512 Pain in left shoulder: Secondary | ICD-10-CM | POA: Diagnosis present

## 2018-01-21 DIAGNOSIS — M62838 Other muscle spasm: Secondary | ICD-10-CM | POA: Insufficient documentation

## 2018-01-21 DIAGNOSIS — M6281 Muscle weakness (generalized): Secondary | ICD-10-CM

## 2018-01-21 DIAGNOSIS — M5442 Lumbago with sciatica, left side: Secondary | ICD-10-CM | POA: Insufficient documentation

## 2018-01-21 NOTE — Therapy (Signed)
[facility] [address], [place], [address] Phone: [phone]   Fax:  [phone]  Physical Therapy Treatment  Patient Details  Name: Danielle Irwin MRN: [id number] Date of Birth: Nov 28, 1970 Referring Provider: Katha Hamming MD   Encounter Date: 01/21/2018  PT End of Session - 01/21/18 2245    Visit Number  35    Number of Visits  44    Date for PT Re-Evaluation  01/29/18    Authorization Type  1 of 6 FOTO    PT Start Time  1334    PT Stop Time  1416    PT Time Calculation (min)  42 min    Activity Tolerance  Patient tolerated treatment well    Behavior During Therapy  [facility] for tasks assessed/performed       Past Medical History:  Diagnosis Date  . Asthma   . Depression   . Family history of adverse reaction to anesthesia    FATHER WAS SLOW TO WAKE UP  . Headache    OCCASIONAL MIGRAINES  . HNP (herniated nucleus pulposus), lumbar   . Hx of epistaxis   . Insomnia   . OA (osteoarthritis)   . Obesity   . PCOS (polycystic ovarian syndrome)   . Sensitive skin   . Vitamin D deficiency     Past Surgical History:  Procedure Laterality Date  . LUMBAR LAMINECTOMY/DECOMPRESSION MICRODISCECTOMY Right 06/02/2014   Procedure: MICRO LUMBAR DECOMPRESSION L4 - L5 ON THE RIGHT 1 LEVEL;  Surgeon: Johnn Hai, MD;  [facility];  Service: Orthopedics;  Laterality: Right;  . ORIF HUMERUS FRACTURE Left 08/01/2017   Procedure: OPEN REDUCTION INTERNAL FIXATION (ORIF) PROXIMAL HUMERUS FRACTURE;  Surgeon: Shona Needles, MD;  Location: [facility];  Service: Orthopedics;  Laterality: Left;  . WISDOM TOOTH EXTRACTION      There were no vitals filed for this visit.  Subjective Assessment - 01/21/18 1337    Subjective  Patient reports she is doing better with left UE with functional use and continues with lower back pain. She was seen by MD and has no restrictions for left UE and has a new referral for back pain and  treatment.     Pertinent History  Patient reports she fell into a storm door at neighbors after stubbing toe on step 07/30/17. She felt immediate pain and numbness left UE and she could not move her left UE. She was transported to ED and then transported to [facility] in [place] and underwent surgery with ORIF 08/01/17 and released to home 08/03/17.     Limitations  Lifting;Walking;House hold activities   out of work; personal care; staying at parent;s due to limitations   Patient Stated Goals  to be able to use her arm again for personal care, return to all ADL's with minimal difficutly    Currently in Pain?  Other (Comment)   no pain at rest and increased pain to 2-3/10 with movement left UE in shoulder        Objective: Palpation: hypersensitive left UE anterior aspect of shoulder/pectoral muscles: decreased soft tissue mobility with moderate spasms palpable lats, subscapularis, teres muscles   Treatment:    Therapeutic exercise:  performed with assistance, verbal and tactile cues of therapist: goal: improve strength, ROM, functional use left UE Supine with head of table elevated: AAROM left shoulder: 10 reps each plane: up to 110 degrees  flexion; pain limited further motion: ER to 50 degrees;  abduction to 90 degrees Rhythmic stabilization (RS) left shoulder at 90 degrees flexion and neutral rotations at side 2 x 10 reps each 3# weight for stabilization left shoulder hold 10 seconds and then shoulder press left UE x 10 reps with assistance as needed 3# weight ER off trunk x 15 reps  Manual Therapy: 10 min STM performed to left shoulder anterior aspect, posterior shoulder and subscapularis superficial techniques and compression techniques with patient supine lying, in conjunction with exercises with improved ROM with less pain reported at end ranges    Patient response to treatment: improved ROM left shoulder forward elevation with less pain following STM. Patient improved technique with  exercises with assistance of therapist and moderate cuing          PT Education - 01/21/18 1343    Education provided  Yes    Education Details  exercise instruction for technique    Person(s) Educated  Patient    Methods  Explanation;Demonstration;Verbal cues    Comprehension  Verbalized understanding;Returned demonstration;Verbal cues required          PT Long Term Goals - 12/18/17 1028      PT LONG TERM GOAL #1   Title  Patient will improve FOTO score to 25/100 demonstrating improvement with functional use left UE for daily tasks    Baseline  FOTO 4/100; 09/04/17 4/100 no change due to severity of injury/surgery and continued restrictions on use/movement; 11/10/17 26/100    Status  Achieved      PT LONG TERM GOAL #2   Title  Patient will improve FOTO score to 50/100 demonstrating improvement with functional use left UE for daily tasks    Baseline  FOTO 4/100; 12/10/2017 38/100    Status  On-going    Target Date  01/29/18      PT LONG TERM GOAL #3   Title  patient will be independent with home exercises for flexibility and strength to allow transition to self management once discharged from physical therapy    Baseline  requires assistance for ROM left shoulder, guidance and cuing to perform all exercises    Status  On-going    Target Date  01/29/18            Plan - 01/21/18 1430    Clinical Impression Statement  Patient demonstrates steady progress with improved ROM and strenght for functional use left UE. She demonstrated improved ROM with decreased pain in left shoulder following STM and repeated ROM exercises. She continues with decreased strength and endurance for daily activities and will benefit from additional physical therapy intervention.     Rehab Potential  Good    Clinical Impairments Affecting Rehab Potential  (+) motivated, acute condition (-)obesity, DDD, lumbar     PT Frequency  2x / week    PT Duration  6 weeks    PT Treatment/Interventions  Manual  techniques;Neuromuscular re-education;Patient/family education;Cryotherapy;Electrical Stimulation;Moist Heat;Therapeutic exercise;Therapeutic activities;Balance training;Scar mobilization;Passive range of motion;Iontophoresis 4mg /ml Dexamethasone    PT Next Visit Plan  pain control, progressive exercises PROM/AAROM shoulder and AROM elbow to hand to improve control/strength left UE; iontophoresis 4mg /ml Dexamethasone    PT Home Exercise Plan  pendulum exercise for shoulder; AAROM left shoulder supine, isometric hold at 90 degrees elevation 90 degrees; active shoulder forward elevation and abduction, ER right shoulder       Patient will benefit from skilled therapeutic intervention in order to improve the following deficits and impairments:  Pain, Impaired UE functional use, Impaired perceived functional ability,  Decreased strength, Decreased range of motion, Decreased endurance, Decreased activity tolerance, Decreased balance, Obesity  Visit Diagnosis: Stiffness of left shoulder, not elsewhere classified  Acute pain of left shoulder  Muscle weakness (generalized)  Other muscle spasm     Problem List Patient Active Problem List   Diagnosis Date Noted  . DDD (degenerative disc disease), thoracolumbar 01/06/2018  . DVT (deep venous thrombosis) ([place]) 08/29/2017  . Leukocytosis 08/06/2017  . Humerus fracture 08/01/2017  . CPAP (continuous positive airway pressure) dependence 08/01/2017  . Asthma 08/01/2017  . HTN (hypertension) 06/24/2017  . Depression 11/07/2015  . Systolic murmur 41/74/0814  . Morbid obesity ([place]) 10/26/2014  . Insomnia 06/21/2014  . Anxiety 06/21/2014  . Spinal stenosis at L4-L5 level 06/02/2014  . POLYCYSTIC OVARIAN DISEASE 06/19/2010    Jomarie Longs PT 01/21/2018, 10:53 PM  [facility] [address] [place], [address] Phone: [phone]   Fax:  [phone]  Name: Danielle Irwin MRN: [id number] Date of Birth: 02-Jun-1971

## 2018-01-26 ENCOUNTER — Ambulatory Visit: Payer: BLUE CROSS/BLUE SHIELD

## 2018-01-26 DIAGNOSIS — M25612 Stiffness of left shoulder, not elsewhere classified: Secondary | ICD-10-CM

## 2018-01-26 DIAGNOSIS — G8929 Other chronic pain: Secondary | ICD-10-CM

## 2018-01-26 DIAGNOSIS — M6281 Muscle weakness (generalized): Secondary | ICD-10-CM

## 2018-01-26 DIAGNOSIS — M62838 Other muscle spasm: Secondary | ICD-10-CM

## 2018-01-26 DIAGNOSIS — M25512 Pain in left shoulder: Secondary | ICD-10-CM

## 2018-01-26 DIAGNOSIS — M5442 Lumbago with sciatica, left side: Secondary | ICD-10-CM

## 2018-01-26 NOTE — Patient Instructions (Signed)
Access Code: [email or web address]  URL: [email or web address]  Date: 01/26/2018  Prepared by: Lieutenant Diego   Exercises  Sidelying Bent Knee Hip Flexion - 3 reps - 1 sets - 30 hold - 2x daily - 7x weekly  Sidelying Transversus Abdominis Bracing - 10 reps - 1 sets - 3 hold - 2x daily - 7x weekly  Lying Prone with 1 Pillow - 1 reps - 1 sets - 5 hold - 2x daily - 7x weekly

## 2018-01-26 NOTE — Therapy (Signed)
[facility] [address] [place], [address] Phone: [phone]   Fax:  [phone]  Physical Therapy Treatment  Patient Details  Name: Danielle Irwin MRN: [id number] Date of Birth: Sep 17, 1970 Referring Provider: Katha Hamming   Encounter Date: 01/26/2018  PT End of Session - 01/26/18 1826    Visit Number  36    Number of Visits  44    Date for PT Re-Evaluation  01/29/18    Authorization Type  (FOTO performed this session)    PT Start Time  1730    PT Stop Time  1830    PT Time Calculation (min)  60 min    Activity Tolerance  Patient limited by pain    Behavior During Therapy  [facility] for tasks assessed/performed       Past Medical History:  Diagnosis Date  . Asthma   . Depression   . Family history of adverse reaction to anesthesia    FATHER WAS SLOW TO WAKE UP  . Headache    OCCASIONAL MIGRAINES  . HNP (herniated nucleus pulposus), lumbar   . Hx of epistaxis   . Insomnia   . OA (osteoarthritis)   . Obesity   . PCOS (polycystic ovarian syndrome)   . Sensitive skin   . Vitamin D deficiency     Past Surgical History:  Procedure Laterality Date  . LUMBAR LAMINECTOMY/DECOMPRESSION MICRODISCECTOMY Right 06/02/2014   Procedure: MICRO LUMBAR DECOMPRESSION L4 - L5 ON THE RIGHT 1 LEVEL;  Surgeon: Johnn Hai, MD;  [facility];  Service: Orthopedics;  Laterality: Right;  . ORIF HUMERUS FRACTURE Left 08/01/2017   Procedure: OPEN REDUCTION INTERNAL FIXATION (ORIF) PROXIMAL HUMERUS FRACTURE;  Surgeon: Shona Needles, MD;  Location: [facility];  Service: Orthopedics;  Laterality: Left;  . WISDOM TOOTH EXTRACTION      Objective: Shoulder:  Palpation: hypersensitive left UE anterior aspect of shoulder/pectoral muscles: decreased soft tissue mobility with moderate spasms palpable lats, subscapularis, pec major & minor, teres muscles  AROM of L shoulder: 110deg forward elevated, 110 abduction, ER  50deg AAROM of L shoulder: 145deg forward elevation, 115 abduction  MMT: 3+/5 flex/ER/IR, 4-/5 for abduction, elbow flexion/extension in available range.  Lumbar spine: Lumbar AROM: Flex: 25% Extension: 25%, with sharp pain R lat flex 50% L lat flex: 50% MMT: grossly 3/5 on LLE, 3+/5 on R  Special tests:  Slump mildly + on L SLR + on L  Palpation: Hypersensitivity with palpation to lumbar spinous processes, B/L PSIS; hypertonicity of lumbar paraspinals, glutes, piriformis Gait: severely antalgic on LLE, use of cane, decreased speed  Treatment:    Therapeutic exercise:  performed with assistance, verbal and tactile cues of therapist: goal: improve strength, ROM, pain management Prone lying with pillow under L ankle for centralization of symptoms, relaxation of muscle tension x 63mins. Patient reported increased lumbar area pain, decreased L foot numbness.  sidelying sciatic nerve flossing of LLE, L knee flexion/extension in varying position of hip flexion and extension 15-20reps  Transverse abdominis activation in sidelying for core strengthening, postural awareness 15x3sec holds  S/l hip flexion and adduction of LLE for tissue elasticity/stretching of posterior hip muscles 2x30sec holds  Manual Therapy: 10 min STM in sidelying performed to L posterior hip, lateral/posterior thigh, lumbar paraspinals to decrease muscle tension and pain relief, 25% improvement in soft tissue integrity   Patient response to treatment: Patient reported pain decreased from 7/10, to 6/10, mild improvement in antalgic gait.  PT Education - 01/26/18 1930    Education provided  Yes    Education Details  updated HEP to include lumbar exercises    Person(s) Educated  Patient    Methods  Explanation;Demonstration;Handout    Comprehension  Verbalized understanding;Returned demonstration;Tactile cues required          PT Long Term Goals - 01/26/18 1933      PT LONG TERM GOAL #1   Title  Patient  will improve FOTO score to 25/100 demonstrating improvement with functional use left UE for daily tasks    Baseline  FOTO 4/100; 09/04/17 4/100 no change due to severity of injury/surgery and continued restrictions on use/movement; 11/10/17 26/100    Status  Achieved    Target Date  12/23/17      PT LONG TERM GOAL #2   Title  Patient will improve FOTO score to 50/100 demonstrating improvement with functional use left UE for daily tasks    Baseline  FOTO 4/100; 12/10/2017 38/100 01/26/18 43/100    Time  8    Status  On-going    Target Date  03/23/18      PT LONG TERM GOAL #3   Title  patient will be independent with home exercises for flexibility and strength to allow transition to self management once discharged from physical therapy    Baseline  requires assistance for ROM left shoulder, guidance and cuing to perform all exercises    Time  8    Period  Weeks    Status  On-going    Target Date  03/23/18      PT LONG TERM GOAL #4   Title  Patient will demonstrate lumbar range of motion to [facility] to improve ability to perform ADLs such as reaching, dressing, sitting, drivig, etc.     Time  8    Period  Weeks    Status  New    Target Date  03/23/18      PT LONG TERM GOAL #5   Title  Patient will demonstrate LE strength of at least 4+/5 to increase ease with performing functional activities such as transfers, stair navigation, walking, etc.    Time  8    Period  Weeks    Status  New    Target Date  03/23/18         Plan - 01/26/18 1941    Clinical Impression Statement  Patient continues to present with L shoulder pain, ROM deficits, weakness, tenderness to palpation, and decreased ability to perform functional tasks such as reaching, dressing, carrying, etc. The patient would continue to benefit from skilled physical therapy intervention to address these limitations to maximize functional abilities. In addition, low back pain was assessed today, with patient complaints of severe low back  pain and new onset of radicular pain and L foot numbness. Patient also has decreased lumbar ROM, LE strength, core strength, posture abnormalities and impaired soft tissue integrity. The patient would benefit from further skilled physical therapy intervention for pain management and to address limitations in functional abilities limited by low back pain. Goals have been updated to include lumbar region. The patient may also benefit from further imaging due to the evolving symptoms of radiculopathy.     Rehab Potential  Good    Clinical Impairments Affecting Rehab Potential  (+) motivated, acute condition (-)obesity, DDD, lumbar     PT Frequency  2x / week    PT Duration  8 weeks    PT Treatment/Interventions  Manual techniques;Neuromuscular re-education;Patient/family education;Cryotherapy;Electrical Stimulation;Moist Heat;Therapeutic exercise;Therapeutic activities;Balance training;Scar mobilization;Passive range of motion;Iontophoresis 4mg /ml Dexamethasone;Gait training;Stair training;Dry needling;Ultrasound;Traction;Functional mobility training    PT Next Visit Plan  pain control, progressive exercises PROM/AAROM shoulder and AROM elbow to hand to improve control/strength left UE; iontophoresis 4mg /ml Dexamethasone    PT Home Exercise Plan  pendulum exercise for shoulder; AAROM left shoulder supine, isometric hold at 90 degrees elevation 90 degrees; active shoulder forward elevation and abduction, ER right shoulder    Consulted and Agree with Plan of Care  Patient       Patient will benefit from skilled therapeutic intervention in order to improve the following deficits and impairments:  Pain, Impaired UE functional use, Impaired perceived functional ability, Decreased strength, Decreased range of motion, Decreased endurance, Decreased activity tolerance, Decreased balance, Obesity  Visit Diagnosis: Stiffness of left shoulder, not elsewhere classified  Acute pain of left shoulder  Muscle  weakness (generalized)  Other muscle spasm  Chronic left-sided low back pain with left-sided sciatica    Problem List Patient Active Problem List   Diagnosis Date Noted  . DDD (degenerative disc disease), thoracolumbar 01/06/2018  . DVT (deep venous thrombosis) ([place]) 08/29/2017  . Leukocytosis 08/06/2017  . Humerus fracture 08/01/2017  . CPAP (continuous positive airway pressure) dependence 08/01/2017  . Asthma 08/01/2017  . HTN (hypertension) 06/24/2017  . Depression 11/07/2015  . Systolic murmur 24/23/5361  . Morbid obesity ([place]) 10/26/2014  . Insomnia 06/21/2014  . Anxiety 06/21/2014  . Spinal stenosis at L4-L5 level 06/02/2014  . POLYCYSTIC OVARIAN DISEASE 06/19/2010   Lieutenant Diego PT, DPT 8:25 AM,01/27/18 [phone]  [facility] [address], [place], [address] Phone: [phone]   Fax:  [phone]  Name: Danielle Irwin MRN: [id number] Date of Birth: Aug 13, 1970

## 2018-01-29 ENCOUNTER — Ambulatory Visit: Payer: BLUE CROSS/BLUE SHIELD

## 2018-02-02 ENCOUNTER — Ambulatory Visit: Payer: BLUE CROSS/BLUE SHIELD

## 2018-02-03 ENCOUNTER — Ambulatory Visit: Payer: BLUE CROSS/BLUE SHIELD

## 2018-02-03 DIAGNOSIS — M25612 Stiffness of left shoulder, not elsewhere classified: Secondary | ICD-10-CM

## 2018-02-03 DIAGNOSIS — G8929 Other chronic pain: Secondary | ICD-10-CM

## 2018-02-03 DIAGNOSIS — M62838 Other muscle spasm: Secondary | ICD-10-CM

## 2018-02-03 DIAGNOSIS — M5442 Lumbago with sciatica, left side: Secondary | ICD-10-CM

## 2018-02-03 DIAGNOSIS — M25512 Pain in left shoulder: Secondary | ICD-10-CM

## 2018-02-03 DIAGNOSIS — M6281 Muscle weakness (generalized): Secondary | ICD-10-CM

## 2018-02-03 NOTE — Therapy (Signed)
[facility] [address], [place], [address] Phone: [phone]   Fax:  [phone]  Physical Therapy Treatment  Patient Details  Name: Danielle Irwin MRN: [id number] Date of Birth: 1970/12/09 Referring Provider: Katha Hamming   Encounter Date: 02/03/2018  PT End of Session - 02/03/18 1524    Visit Number  37    Number of Visits  44    Date for PT Re-Evaluation  01/29/18    Authorization Type  (FOTO performed this session)    PT Start Time  1432    PT Stop Time  1520    PT Time Calculation (min)  48 min    Activity Tolerance  Patient tolerated treatment well    Behavior During Therapy  [facility] for tasks assessed/performed       Past Medical History:  Diagnosis Date  . Asthma   . Depression   . Family history of adverse reaction to anesthesia    FATHER WAS SLOW TO WAKE UP  . Headache    OCCASIONAL MIGRAINES  . HNP (herniated nucleus pulposus), lumbar   . Hx of epistaxis   . Insomnia   . OA (osteoarthritis)   . Obesity   . PCOS (polycystic ovarian syndrome)   . Sensitive skin   . Vitamin D deficiency     Past Surgical History:  Procedure Laterality Date  . LUMBAR LAMINECTOMY/DECOMPRESSION MICRODISCECTOMY Right 06/02/2014   Procedure: MICRO LUMBAR DECOMPRESSION L4 - L5 ON THE RIGHT 1 LEVEL;  Surgeon: Johnn Hai, MD;  [facility];  Service: Orthopedics;  Laterality: Right;  . ORIF HUMERUS FRACTURE Left 08/01/2017   Procedure: OPEN REDUCTION INTERNAL FIXATION (ORIF) PROXIMAL HUMERUS FRACTURE;  Surgeon: Shona Needles, MD;  [facility];  Service: Orthopedics;  Laterality: Left;  . WISDOM TOOTH EXTRACTION      There were no vitals filed for this visit.  Subjective Assessment - 02/03/18 1435    Subjective  Patient reports L shoulder pain that she thinks is due to leaning on her LUE. Also reports low  back and LLE pain is present as well, and has progressively worsened over the day.    Pertinent History  Patient reports she fell into a storm door at neighbors after stubbing toe on step 07/30/17. She felt immediate pain and numbness left UE and she could not move her left UE. She was transported to ED and then transported to [facility] in [place] and underwent surgery with ORIF 08/01/17 and released to home 08/03/17.  Patient now has a referral to include low back pain. Patient is 47 yo female that presents with acute on chronic LBP that started June 16th after a prolonged car ride to [facility]. Past lumbar laminectomy/decompression microdisectomy 06/02/14. States her pain has been worse since traveling and has developed pain that radiates from low back to L foot. Complains of new onset of numbness for the last week, says the numbness can vary, but is always present now.  States the sciatica on the R side pain was similar, but was never this severe. She has had steroid shots to the spine in the past but none recently. Sitting tolerance is 50mins or less, significant pain with ADLs standing tolerance ~20-7mins. Reports relieving factors are laying down, rest.    Limitations  Lifting;Walking;House hold activities    Currently in Pain?  Yes    Pain Score  3    3-4 for LUE, 5-6 for LBP/LLE  Pain Location  Generalized    Pain Orientation  Left;Right    Pain Descriptors / Indicators  Aching;Throbbing    Pain Type  Chronic pain    Pain Onset  More than a month ago        Objective: Palpation: hypersensitive left UE anterior aspect of shoulder/pectoral muscles: decreased soft tissue mobility with moderate spasms palpable lats, subscapularis, pec major & minor, teres muscles  Gait: mildly antalgic on LLE, use of cane, decreased speed  Modalities: High volt stim (4pads), 100p 2sec x43mins to L posterior lumbar paraspinals and posterior hip muscles for pain management (~265mV), IASTM performed at same time to LUE.   Manual therapy: x33mins IASTM to L UE, trigger point release to L pec for  tissue extensibility/elasticity/improved integrity  Therapeutic exercise:  performed with assistance, verbal and tactile cues of therapist: goal: improve strength, ROM, pain management  LTR in supine gentle ROM 6x5" Hip adduction 10x3" in semi reclined position with verbal cues sidelying sciatic nerve flossing of LLE, L knee flexion/extension in varying position of hip flexion and extension 15-20reps Transverse abdominis activation in sidelying for core strengthening, postural awareness 15x5sec holds  Patient response to treatment: Patient reported pain decreased low back pain from 5-6/10, to 4/10, LUE from 4/10 to 2/10 at end of session. Improvement in soft tissue integrity of LUE by 50%.     PT Education - 02/03/18 1521    Education provided  Yes    Education Details  updated HEP for lumbar exercise :hip adduction and LTR    Person(s) Educated  Patient    Methods  Explanation;Demonstration    Comprehension  Verbalized understanding;Returned demonstration          PT Long Term Goals - 01/26/18 1933      PT LONG TERM GOAL #1   Title  Patient will improve FOTO score to 25/100 demonstrating improvement with functional use left UE for daily tasks    Baseline  FOTO 4/100; 09/04/17 4/100 no change due to severity of injury/surgery and continued restrictions on use/movement; 11/10/17 26/100    Status  Achieved    Target Date  12/23/17      PT LONG TERM GOAL #2   Title  Patient will improve FOTO score to 50/100 demonstrating improvement with functional use left UE for daily tasks    Baseline  FOTO 4/100; 12/10/2017 38/100 01/26/18 43/100    Time  8    Status  On-going    Target Date  03/23/18      PT LONG TERM GOAL #3   Title  patient will be independent with home exercises for flexibility and strength to allow transition to self management once discharged from physical therapy    Baseline  requires assistance for ROM left shoulder, guidance and cuing to perform all exercises    Time   8    Period  Weeks    Status  On-going    Target Date  03/23/18      PT LONG TERM GOAL #4   Title  Patient will demonstrate lumbar range of motion to [facility] to improve ability to perform ADLs such as reaching, dressing, sitting, drivig, etc.     Time  8    Period  Weeks    Status  New    Target Date  03/23/18      PT LONG TERM GOAL #5   Title  Patient will demonstrate LE strength of at least 4+/5 to increase ease with performing functional activities  such as transfers, stair navigation, walking, etc.    Time  8    Period  Weeks    Status  New    Target Date  03/23/18            Plan - 02/03/18 1522    Clinical Impression Statement  Patient reported improvement in mobility of lumbar and L shoulder post session today. IASTM with 50% improvement in tissue integrity for LUE ant delt/bicep tendon, trigger point release of L pec. Patient able to progress transverse abdmoninis activation to increased hold showing improved endurance. The patient would benefit from continued PT to progress towards goals.     Rehab Potential  Good    Clinical Impairments Affecting Rehab Potential  (+) motivated, acute condition (-)obesity, DDD, lumbar     PT Frequency  2x / week    PT Duration  8 weeks    PT Treatment/Interventions  Manual techniques;Neuromuscular re-education;Patient/family education;Cryotherapy;Electrical Stimulation;Moist Heat;Therapeutic exercise;Therapeutic activities;Balance training;Scar mobilization;Passive range of motion;Iontophoresis 4mg /ml Dexamethasone;Gait training;Stair training;Dry needling;Ultrasound;Traction;Functional mobility training    PT Next Visit Plan  pain control, progressive exercises PROM/AAROM shoulder and AROM elbow to hand to improve control/strength left UE; iontophoresis 4mg /ml Dexamethasone    PT Home Exercise Plan  pendulum exercise for shoulder; AAROM left shoulder supine, isometric hold at 90 degrees elevation 90 degrees; active shoulder forward elevation  and abduction, ER right shoulder    Consulted and Agree with Plan of Care  Patient       Patient will benefit from skilled therapeutic intervention in order to improve the following deficits and impairments:  Pain, Impaired UE functional use, Impaired perceived functional ability, Decreased strength, Decreased range of motion, Decreased endurance, Decreased activity tolerance, Decreased balance, Obesity  Visit Diagnosis: Stiffness of left shoulder, not elsewhere classified  Acute pain of left shoulder  Muscle weakness (generalized)  Other muscle spasm  Chronic left-sided low back pain with left-sided sciatica     Problem List Patient Active Problem List   Diagnosis Date Noted  . DDD (degenerative disc disease), thoracolumbar 01/06/2018  . DVT (deep venous thrombosis) ([place]) 08/29/2017  . Leukocytosis 08/06/2017  . Humerus fracture 08/01/2017  . CPAP (continuous positive airway pressure) dependence 08/01/2017  . Asthma 08/01/2017  . HTN (hypertension) 06/24/2017  . Depression 11/07/2015  . Systolic murmur 37/48/2707  . Morbid obesity ([place]) 10/26/2014  . Insomnia 06/21/2014  . Anxiety 06/21/2014  . Spinal stenosis at L4-L5 level 06/02/2014  . POLYCYSTIC OVARIAN DISEASE 06/19/2010    Lieutenant Diego PT, DPT 3:30 PM,02/03/18 [facility] [address], [place], [address] Phone: [phone]   Fax:  [phone]  Name: Danielle Irwin MRN: [id number] Date of Birth: 03/08/1971

## 2018-02-04 ENCOUNTER — Telehealth: Payer: Self-pay | Admitting: Family Medicine

## 2018-02-04 ENCOUNTER — Ambulatory Visit (INDEPENDENT_AMBULATORY_CARE_PROVIDER_SITE_OTHER): Payer: BLUE CROSS/BLUE SHIELD | Admitting: Orthopaedic Surgery

## 2018-02-04 ENCOUNTER — Ambulatory Visit: Payer: BLUE CROSS/BLUE SHIELD

## 2018-02-04 ENCOUNTER — Encounter (INDEPENDENT_AMBULATORY_CARE_PROVIDER_SITE_OTHER): Payer: Self-pay | Admitting: Orthopaedic Surgery

## 2018-02-04 VITALS — BP 141/87 | HR 94 | Ht 66.5 in | Wt 370.0 lb

## 2018-02-04 DIAGNOSIS — M545 Low back pain: Secondary | ICD-10-CM | POA: Diagnosis not present

## 2018-02-04 NOTE — Telephone Encounter (Signed)
Copied from [place] [phone]. Topic: General - Other >> Feb 04, 2018  4:11 PM Sheran Luz wrote: Reason for CRM: Johnette Abraham RN with [facility] called on behalf of the pt inquiring if pt had a history of osteoporosis. Merry Proud would like a call back if possible at [phone] ext [phone]

## 2018-02-04 NOTE — Progress Notes (Signed)
Office Visit Note   Patient: Danielle Irwin           Date of Birth: 05-20-1971           MRN: [id number] Visit Date: 02/04/2018              Requested by: Lucille Passy, MD [place], [place] [address] PCP: Lucille Passy, MD   Assessment & Plan: Visit Diagnoses:  1. Low back pain, unspecified back pain laterality, unspecified chronicity, with sciatica presence unspecified     Plan: Patient has not responded to medication activity modification and has morbid obesity.  At times in the past she is gotten relief with lumbar epidural injection.  She had previous surgery at L4-5 by Dr. Tonita Cong on 06/02/2014.  Will obtain a new MRI scan open scanner due to body habitus with and without contrast and see patient back for review of her MRI scan.  Follow-Up Instructions: No follow-ups on file.   Orders:  Orders Placed This Encounter  Procedures  . MR Lumbar Spine W Wo Contrast   No orders of the defined types were placed in this encounter.     Procedures: No procedures performed   Clinical Data: No additional findings.   Subjective: Chief Complaint  Patient presents with  . Lower Back - Pain    HPI 47 year old female with morbid obesity BMI 58 returns with ongoing significant back pain.  She has known diagnosis of spinal stenosis at L4-5 had previous epidural injections which gave her relief.  She has been through physical therapy without improvement.  She is getting over humeral fracture and remains out of work for a few more weeks for postop fracture care therapy.  Plain radiographs 01/12/2018 lumbar spine showed multilevel lumbar degenerative changes negative for acute lesion and negative for spondylolysis.  Review of Systems 14 point review of systems updated unchanged from 01/20/2018 other than as mentioned in HPI.   Objective: Vital Signs: BP (!) 141/87   Pulse 94   Ht 5' 6.5" (1.689 m)   Wt (!) 370 lb (167.8 kg)   BMI 58.82 kg/m   Physical Exam    Constitutional: She is oriented to person, place, and time. She appears well-developed.  HENT:  Head: Normocephalic.  Right Ear: External ear normal.  Left Ear: External ear normal.  Eyes: Pupils are equal, round, and reactive to light.  Neck: No tracheal deviation present. No thyromegaly present.  Cardiovascular: Normal rate.  Pulmonary/Chest: Effort normal.  Abdominal: Soft.  Morbid obesity  Neurological: She is alert and oriented to person, place, and time.  Skin: Skin is warm and dry.  Psychiatric: She has a normal mood and affect. Her behavior is normal.    Ortho Exam patient has less pain in supine position.  She has pain with standing and ambulation.  She is having more left leg than right leg pain.  No pain with hip range of motion.  Quads are active and symmetrical.  Specialty Comments:  No specialty comments available.  Imaging: No results found.   PMFS History: Patient Active Problem List   Diagnosis Date Noted  . DDD (degenerative disc disease), thoracolumbar 01/06/2018  . DVT (deep venous thrombosis) ([place]) 08/29/2017  . Leukocytosis 08/06/2017  . Humerus fracture 08/01/2017  . CPAP (continuous positive airway pressure) dependence 08/01/2017  . Asthma 08/01/2017  . HTN (hypertension) 06/24/2017  . Depression 11/07/2015  . Systolic murmur 82/50/0370  . Morbid obesity ([place]) 10/26/2014  . Insomnia 06/21/2014  .  Anxiety 06/21/2014  . Spinal stenosis at L4-L5 level 06/02/2014  . POLYCYSTIC OVARIAN DISEASE 06/19/2010   Past Medical History:  Diagnosis Date  . Asthma   . Depression   . Family history of adverse reaction to anesthesia    FATHER WAS SLOW TO WAKE UP  . Headache    OCCASIONAL MIGRAINES  . HNP (herniated nucleus pulposus), lumbar   . Hx of epistaxis   . Insomnia   . OA (osteoarthritis)   . Obesity   . PCOS (polycystic ovarian syndrome)   . Sensitive skin   . Vitamin D deficiency     Family History  Problem Relation Age of Onset  . Heart  attack Father 30  . Breast cancer Maternal Aunt 16    Past Surgical History:  Procedure Laterality Date  . LUMBAR LAMINECTOMY/DECOMPRESSION MICRODISCECTOMY Right 06/02/2014   Procedure: MICRO LUMBAR DECOMPRESSION L4 - L5 ON THE RIGHT 1 LEVEL;  Surgeon: Johnn Hai, MD;  [facility];  Service: Orthopedics;  Laterality: Right;  . ORIF HUMERUS FRACTURE Left 08/01/2017   Procedure: OPEN REDUCTION INTERNAL FIXATION (ORIF) PROXIMAL HUMERUS FRACTURE;  Surgeon: Shona Needles, MD;  Location: [facility];  Service: Orthopedics;  Laterality: Left;  . WISDOM TOOTH EXTRACTION     Social History   Occupational History  . Not on file  Tobacco Use  . Smoking status: Former Smoker    Last attempt to quit: 05/26/1990    Years since quitting: 27.7  . Smokeless tobacco: Never Used  Substance and Sexual Activity  . Alcohol use: No    Alcohol/week: 0.0 standard drinks  . Drug use: No  . Sexual activity: Not on file

## 2018-02-05 ENCOUNTER — Encounter: Payer: BLUE CROSS/BLUE SHIELD | Admitting: Physical Therapy

## 2018-02-05 ENCOUNTER — Ambulatory Visit: Payer: BLUE CROSS/BLUE SHIELD

## 2018-02-05 DIAGNOSIS — G8929 Other chronic pain: Secondary | ICD-10-CM

## 2018-02-05 DIAGNOSIS — M25512 Pain in left shoulder: Secondary | ICD-10-CM

## 2018-02-05 DIAGNOSIS — M62838 Other muscle spasm: Secondary | ICD-10-CM

## 2018-02-05 DIAGNOSIS — M6281 Muscle weakness (generalized): Secondary | ICD-10-CM

## 2018-02-05 DIAGNOSIS — M25612 Stiffness of left shoulder, not elsewhere classified: Secondary | ICD-10-CM

## 2018-02-05 DIAGNOSIS — M5442 Lumbago with sciatica, left side: Secondary | ICD-10-CM

## 2018-02-05 NOTE — Therapy (Signed)
[facility] [address], [place], [address] Phone: [phone]   Fax:  [phone]  Physical Therapy Treatment  Patient Details  Name: Danielle Irwin MRN: [id number] Date of Birth: 12/12/1970 Referring Provider: Katha Hamming   Encounter Date: 02/05/2018  PT End of Session - 02/05/18 1228    Visit Number  38    Number of Visits  8    Authorization Type  0/8    PT Start Time  1115    PT Stop Time  1200    PT Time Calculation (min)  45 min    Activity Tolerance  Patient tolerated treatment well    Behavior During Therapy  [facility] for tasks assessed/performed       Past Medical History:  Diagnosis Date  . Asthma   . Depression   . Family history of adverse reaction to anesthesia    FATHER WAS SLOW TO WAKE UP  . Headache    OCCASIONAL MIGRAINES  . HNP (herniated nucleus pulposus), lumbar   . Hx of epistaxis   . Insomnia   . OA (osteoarthritis)   . Obesity   . PCOS (polycystic ovarian syndrome)   . Sensitive skin   . Vitamin D deficiency     Past Surgical History:  Procedure Laterality Date  . LUMBAR LAMINECTOMY/DECOMPRESSION MICRODISCECTOMY Right 06/02/2014   Procedure: MICRO LUMBAR DECOMPRESSION L4 - L5 ON THE RIGHT 1 LEVEL;  Surgeon: Johnn Hai, MD;  [facility];  Service: Orthopedics;  Laterality: Right;  . ORIF HUMERUS FRACTURE Left 08/01/2017   Procedure: OPEN REDUCTION INTERNAL FIXATION (ORIF) PROXIMAL HUMERUS FRACTURE;  Surgeon: Shona Needles, MD;  Location: [facility];  Service: Orthopedics;  Laterality: Left;  . WISDOM TOOTH EXTRACTION      There were no vitals filed for this visit.  Subjective Assessment - 02/05/18 1120    Subjective  Patient reports that she is more stiff today. States that she felt fine after her last PT appointment. Reports that her LUE is more sore today, to touch after last session.      Currently in Pain?  Yes    Pain Score  5     Pain Location  Leg     Pain Orientation  Left    Pain Descriptors / Indicators  Aching    Pain Type  Chronic pain    Pain Onset  More than a month ago        Objective: Palpation: hypersensitive left UE anterior aspect of shoulder/pectoral muscles: decreased soft tissue mobility with moderate spasms palpable lats, subscapularis, pec major & minor, teres muscles  Gait: moderately antalgic on LLE, use of cane, decreased speed   Modalities: High volt stim?(4pads),?100p 2sec?x29mins?to L posterior lumbar paraspinals and posterior hip muscles for pain management (~247mV), IASTM performed at same time to LUE.   Manual therapy:?x33mins IASTM to L UE, trigger point release to L pec for tissue extensibility/elasticity/improved integrity   Therapeutic exercise: performed with assistance, verbal and tactile cues of therapist: goal: improve strength, ROM, pain management Hip adduction 2x10x3" with TrA activation?in semi reclined position with verbal cues Small bridges in semi reclined position for glute activation with TrA activation x15  sidelying sciatic nerve flossing of LLE, L knee flexion/extension in varying position of hip flexion and extension 15-20reps Transverse abdominis activation in sidelying for core strengthening, postural awareness 15x5sec holds PROM/AAROM of L shoulder in semi reclined position x10 ea direction (ab/flex/IR/ER)    Patient  response to treatment:?Patient reported pain decreased?low back pain?from 5/10, to?3.5/10, LUE from 4/10 to 2/10 at end of session. Improvement in soft tissue integrity of LUE by 25%.    PT Education - 02/05/18 1131    Education provided  Yes    Education Details  therex    [employer]) Educated  Patient    Methods  Explanation;Demonstration    Comprehension  Verbalized understanding          PT Long Term Goals - 01/26/18 1933      PT LONG TERM GOAL #1   Title  Patient will improve FOTO score to 25/100 demonstrating improvement with functional use left UE for  daily tasks    Baseline  FOTO 4/100; 09/04/17 4/100 no change due to severity of injury/surgery and continued restrictions on use/movement; 11/10/17 26/100    Status  Achieved    Target Date  12/23/17      PT LONG TERM GOAL #2   Title  Patient will improve FOTO score to 50/100 demonstrating improvement with functional use left UE for daily tasks    Baseline  FOTO 4/100; 12/10/2017 38/100 01/26/18 43/100    Time  8    Status  On-going    Target Date  03/23/18      PT LONG TERM GOAL #3   Title  patient will be independent with home exercises for flexibility and strength to allow transition to self management once discharged from physical therapy    Baseline  requires assistance for ROM left shoulder, guidance and cuing to perform all exercises    Time  8    Period  Weeks    Status  On-going    Target Date  03/23/18      PT LONG TERM GOAL #4   Title  Patient will demonstrate lumbar range of motion to [facility] to improve ability to perform ADLs such as reaching, dressing, sitting, drivig, etc.     Time  8    Period  Weeks    Status  New    Target Date  03/23/18      PT LONG TERM GOAL #5   Title  Patient will demonstrate LE strength of at least 4+/5 to increase ease with performing functional activities such as transfers, stair navigation, walking, etc.    Time  8    Period  Weeks    Status  New    Target Date  03/23/18            Plan - 02/05/18 1226    Clinical Impression Statement  At end of session patient reported decrease in LUE and LBP/R leg pain. Improved tissue integrity post STM for LUE, as well as improved AAROM after repetitions. Patient still exhibits pain/difficulty with transfers.     Rehab Potential  Good    Clinical Impairments Affecting Rehab Potential  (+) motivated, acute condition (-)obesity, DDD, lumbar     PT Frequency  2x / week    PT Duration  8 weeks    PT Treatment/Interventions  Manual techniques;Neuromuscular re-education;Patient/family  education;Cryotherapy;Electrical Stimulation;Moist Heat;Therapeutic exercise;Therapeutic activities;Balance training;Scar mobilization;Passive range of motion;Iontophoresis 4mg /ml Dexamethasone;Gait training;Stair training;Dry needling;Ultrasound;Traction;Functional mobility training    PT Next Visit Plan  pain control, progressive exercises PROM/AAROM shoulder and AROM elbow to hand to improve control/strength left UE; iontophoresis 4mg /ml Dexamethasone    Consulted and Agree with Plan of Care  Patient       Patient will benefit from skilled therapeutic intervention in order to improve the following  deficits and impairments:  Pain, Impaired UE functional use, Impaired perceived functional ability, Decreased strength, Decreased range of motion, Decreased endurance, Decreased activity tolerance, Decreased balance, Obesity  Visit Diagnosis: Stiffness of left shoulder, not elsewhere classified  Acute pain of left shoulder  Other muscle spasm  Chronic left-sided low back pain with left-sided sciatica  Muscle weakness (generalized)     Problem List Patient Active Problem List   Diagnosis Date Noted  . DDD (degenerative disc disease), thoracolumbar 01/06/2018  . DVT (deep venous thrombosis) ([place]) 08/29/2017  . Leukocytosis 08/06/2017  . Humerus fracture 08/01/2017  . CPAP (continuous positive airway pressure) dependence 08/01/2017  . Asthma 08/01/2017  . HTN (hypertension) 06/24/2017  . Depression 11/07/2015  . Systolic murmur 93/26/7124  . Morbid obesity ([place]) 10/26/2014  . Insomnia 06/21/2014  . Anxiety 06/21/2014  . Spinal stenosis at L4-L5 level 06/02/2014  . POLYCYSTIC OVARIAN DISEASE 06/19/2010   Lieutenant Diego PT, DPT 12:32 PM,02/05/18 [facility] [address] [place], [address] Phone: [phone]   Fax:  [phone]  Name: Danielle Irwin MRN: [id number] Date of Birth:  [birth year]

## 2018-02-10 ENCOUNTER — Encounter: Payer: BLUE CROSS/BLUE SHIELD | Admitting: Physical Therapy

## 2018-02-10 ENCOUNTER — Ambulatory Visit: Payer: BLUE CROSS/BLUE SHIELD

## 2018-02-12 ENCOUNTER — Ambulatory Visit: Payer: BLUE CROSS/BLUE SHIELD

## 2018-02-12 ENCOUNTER — Encounter: Payer: BLUE CROSS/BLUE SHIELD | Admitting: Physical Therapy

## 2018-02-16 ENCOUNTER — Ambulatory Visit: Payer: BLUE CROSS/BLUE SHIELD

## 2018-02-18 ENCOUNTER — Ambulatory Visit: Payer: BLUE CROSS/BLUE SHIELD | Attending: Student

## 2018-02-18 DIAGNOSIS — M25612 Stiffness of left shoulder, not elsewhere classified: Secondary | ICD-10-CM | POA: Insufficient documentation

## 2018-02-18 DIAGNOSIS — M5442 Lumbago with sciatica, left side: Secondary | ICD-10-CM | POA: Diagnosis present

## 2018-02-18 DIAGNOSIS — M6281 Muscle weakness (generalized): Secondary | ICD-10-CM | POA: Insufficient documentation

## 2018-02-18 DIAGNOSIS — M25512 Pain in left shoulder: Secondary | ICD-10-CM | POA: Insufficient documentation

## 2018-02-18 DIAGNOSIS — M62838 Other muscle spasm: Secondary | ICD-10-CM | POA: Insufficient documentation

## 2018-02-18 DIAGNOSIS — G8929 Other chronic pain: Secondary | ICD-10-CM | POA: Diagnosis present

## 2018-02-18 NOTE — Therapy (Signed)
[facility] [address], [place], [address] Phone: [phone]   Fax:  [phone]  Physical Therapy Treatment  Patient Details  Name: Danielle Irwin MRN: [id number] Date of Birth: [birth year] Referring Provider: Katha Hamming   Encounter Date: 02/18/2018  PT End of Session - 02/18/18 1128    Visit Number  39    Number of Visits  8    Date for PT Re-Evaluation  03/23/18    Authorization Type  1/8    PT Start Time  1117    PT Stop Time  1157    PT Time Calculation (min)  40 min    Activity Tolerance  Patient tolerated treatment well    Behavior During Therapy  [facility] for tasks assessed/performed       Past Medical History:  Diagnosis Date  . Asthma   . Depression   . Family history of adverse reaction to anesthesia    FATHER WAS SLOW TO WAKE UP  . Headache    OCCASIONAL MIGRAINES  . HNP (herniated nucleus pulposus), lumbar   . Hx of epistaxis   . Insomnia   . OA (osteoarthritis)   . Obesity   . PCOS (polycystic ovarian syndrome)   . Sensitive skin   . Vitamin D deficiency     Past Surgical History:  Procedure Laterality Date  . LUMBAR LAMINECTOMY/DECOMPRESSION MICRODISCECTOMY Right 06/02/2014   Procedure: MICRO LUMBAR DECOMPRESSION L4 - L5 ON THE RIGHT 1 LEVEL;  Surgeon: Johnn Hai, MD;  [facility];  Service: Orthopedics;  Laterality: Right;  . ORIF HUMERUS FRACTURE Left 08/01/2017   Procedure: OPEN REDUCTION INTERNAL FIXATION (ORIF) PROXIMAL HUMERUS FRACTURE;  Surgeon: Shona Needles, MD;  Location: [facility];  Service: Orthopedics;  Laterality: Left;  . WISDOM TOOTH EXTRACTION      There were no vitals filed for this visit.  Subjective Assessment - 02/18/18 1118    Subjective  Patient reports she was dong a lot of reaching yesterday, arm is sore. States  that she had minimal/no back pain yesterday until she climbed into the tub for a bath, and had severe pain after. Does report some  improvement in numbness of LLE.    Pertinent History  Patient reports she fell into a storm door at neighbors after stubbing toe on step 07/30/17. She felt immediate pain and numbness left UE and she could not move her left UE. She was transported to ED and then transported to [facility] in [place] and underwent surgery with ORIF 08/01/17 and released to home 08/03/17.  Patient now has a referral to include low back pain. Patient is 47 yo female that presents with acute on chronic LBP that started June 16th after a prolonged car ride to [facility]. Past lumbar laminectomy/decompression microdisectomy 06/02/14. States her pain has been worse since traveling and has developed pain that radiates from low back to L foot. Complains of new onset of numbness for the last week, says the numbness can vary, but is always present now.  States the sciatica on the R side pain was similar, but was never this severe. She has had steroid shots to the spine in the past but none recently. Sitting tolerance is 57mins or less, significant pain with ADLs standing tolerance ~20-50mins. Reports relieving factors are laying down, rest.    Currently in Pain?  Yes    Pain Score  5    LLE,    Pain Orientation  Left  Pain Descriptors / Indicators  Aching    Pain Type  Chronic pain    Pain Radiating Towards  LLE        Objective: Gait: moderately antalgic on LLE, use of cane, decreased speed   Modalities: 48mins High volt stim?(4pads),?100p 2sec?x27mins?to L posterior lumbar paraspinals and posterior hip muscles for pain management (~260mV), LUE AAROM exercises performed first 84mins of modalities.   Manual therapy:?x15 mins STM/IASTM to to L posterior hip muscles (glutes, piriformis), for tissue extensibility/elasticity/improved integrity  Therapeutic exercise: Patient performed with verbal/ visual cues from PT, goal: improve LUE mobility AAROM with cane in supine (head of table elevated) x10 flexion, x10 horizontal  abduction/adduction, x5 abduction in scapular plane (increased pain, discontinued)    Patient response to treatment:?Patient reported pain decreased?low back pain?from 5/10, to 3/10, LUE from 3/10 to 2/10 at end of session. Improvement in soft tissue integrity of LUE by 25%.       PT Education - 02/18/18 1119    Education provided  Yes    Education Details  therex    [employer]) Educated  Patient    Methods  Explanation;Demonstration    Comprehension  Verbalized understanding;Returned demonstration          PT Long Term Goals - 01/26/18 1933      PT LONG TERM GOAL #1   Title  Patient will improve FOTO score to 25/100 demonstrating improvement with functional use left UE for daily tasks    Baseline  FOTO 4/100; 09/04/17 4/100 no change due to severity of injury/surgery and continued restrictions on use/movement; 11/10/17 26/100    Status  Achieved    Target Date  12/23/17      PT LONG TERM GOAL #2   Title  Patient will improve FOTO score to 50/100 demonstrating improvement with functional use left UE for daily tasks    Baseline  FOTO 4/100; 12/10/2017 38/100 01/26/18 43/100    Time  8    Status  On-going    Target Date  03/23/18      PT LONG TERM GOAL #3   Title  patient will be independent with home exercises for flexibility and strength to allow transition to self management once discharged from physical therapy    Baseline  requires assistance for ROM left shoulder, guidance and cuing to perform all exercises    Time  8    Period  Weeks    Status  On-going    Target Date  03/23/18      PT LONG TERM GOAL #4   Title  Patient will demonstrate lumbar range of motion to [facility] to improve ability to perform ADLs such as reaching, dressing, sitting, drivig, etc.     Time  8    Period  Weeks    Status  New    Target Date  03/23/18      PT LONG TERM GOAL #5   Title  Patient will demonstrate LE strength of at least 4+/5 to increase ease with performing functional activities such  as transfers, stair navigation, walking, etc.    Time  8    Period  Weeks    Status  New    Target Date  03/23/18            Plan - 02/18/18 1132    Clinical Impression Statement  Patient continues to show improvement in LUE mobility, and minimal to no change in LBP pain/LLE pain. Patient reports decrease in pain post session.  PT Frequency  2x / week    PT Duration  8 weeks    PT Treatment/Interventions  Manual techniques;Neuromuscular re-education;Patient/family education;Cryotherapy;Electrical Stimulation;Moist Heat;Therapeutic exercise;Therapeutic activities;Balance training;Scar mobilization;Passive range of motion;Iontophoresis 4mg /ml Dexamethasone;Gait training;Stair training;Dry needling;Ultrasound;Traction;Functional mobility training    PT Next Visit Plan  pain control, progressive exercises PROM/AAROM shoulder and AROM elbow to hand to improve control/strength left UE; iontophoresis 4mg /ml Dexamethasone    PT Home Exercise Plan  pendulum exercise for shoulder; AAROM left shoulder supine, isometric hold at 90 degrees elevation 90 degrees; active shoulder forward elevation and abduction, ER right shoulder, AAROM cane flexion    Consulted and Agree with Plan of Care  Patient       Patient will benefit from skilled therapeutic intervention in order to improve the following deficits and impairments:  Pain, Impaired UE functional use, Impaired perceived functional ability, Decreased strength, Decreased range of motion, Decreased endurance, Decreased activity tolerance, Decreased balance, Obesity  Visit Diagnosis: Stiffness of left shoulder, not elsewhere classified  Acute pain of left shoulder  Chronic left-sided low back pain with left-sided sciatica  Muscle weakness (generalized)     Problem List Patient Active Problem List   Diagnosis Date Noted  . DDD (degenerative disc disease), thoracolumbar 01/06/2018  . DVT (deep venous thrombosis) ([place]) 08/29/2017  .  Leukocytosis 08/06/2017  . Humerus fracture 08/01/2017  . CPAP (continuous positive airway pressure) dependence 08/01/2017  . Asthma 08/01/2017  . HTN (hypertension) 06/24/2017  . Depression 11/07/2015  . Systolic murmur 29/56/2130  . Morbid obesity ([place]) 10/26/2014  . Insomnia 06/21/2014  . Anxiety 06/21/2014  . Spinal stenosis at L4-L5 level 06/02/2014  . POLYCYSTIC OVARIAN DISEASE 06/19/2010    Lieutenant Diego PT, DPT 12:54 PM,02/18/18 [phone]  [facility] [address] [place], [address] Phone: [phone]   Fax:  [phone]  Name: Danielle Irwin MRN: [id number] Date of Birth: [birth year]

## 2018-02-19 NOTE — Addendum Note (Signed)
Addended by: Wyline Beady on: 02/19/2018 08:28 AM   Modules accepted: Orders

## 2018-02-21 ENCOUNTER — Ambulatory Visit
Admission: RE | Admit: 2018-02-21 | Discharge: 2018-02-21 | Disposition: A | Discharge status: Home / Self Care | Payer: BLUE CROSS/BLUE SHIELD | Source: Ambulatory Visit | Attending: Orthopaedic Surgery | Admitting: Orthopaedic Surgery

## 2018-02-21 DIAGNOSIS — M545 Low back pain: Secondary | ICD-10-CM

## 2018-02-21 MED ORDER — GADOBENATE DIMEGLUMINE 529 MG/ML IV SOLN
20.0000 mL | Freq: Once | INTRAVENOUS | Status: AC | PRN
  Administered 2018-02-21: 20 mL via INTRAVENOUS

## 2018-02-23 ENCOUNTER — Ambulatory Visit: Payer: BLUE CROSS/BLUE SHIELD

## 2018-02-23 DIAGNOSIS — M25512 Pain in left shoulder: Secondary | ICD-10-CM

## 2018-02-23 DIAGNOSIS — M62838 Other muscle spasm: Secondary | ICD-10-CM

## 2018-02-23 DIAGNOSIS — M25612 Stiffness of left shoulder, not elsewhere classified: Secondary | ICD-10-CM | POA: Diagnosis not present

## 2018-02-23 DIAGNOSIS — M6281 Muscle weakness (generalized): Secondary | ICD-10-CM

## 2018-02-23 DIAGNOSIS — G8929 Other chronic pain: Secondary | ICD-10-CM

## 2018-02-23 DIAGNOSIS — M5442 Lumbago with sciatica, left side: Secondary | ICD-10-CM

## 2018-02-23 NOTE — Therapy (Signed)
[facility] [address], [place], [address] Phone: [phone]   Fax:  [phone]  Physical Therapy Treatment  Patient Details  Name: Danielle Irwin MRN: [id number] Date of Birth: 02-10-1971 Referring Provider: Katha Hamming   Encounter Date: 02/23/2018  PT End of Session - 02/23/18 1658    Visit Number  40    Number of Visits  8    Date for PT Re-Evaluation  03/23/18    Authorization Type  4/8    PT Start Time  1700    PT Stop Time  1745    PT Time Calculation (min)  45 min    Activity Tolerance  Patient tolerated treatment well    Behavior During Therapy  [facility] for tasks assessed/performed       Past Medical History:  Diagnosis Date  . Asthma   . Depression   . Family history of adverse reaction to anesthesia    FATHER WAS SLOW TO WAKE UP  . Headache    OCCASIONAL MIGRAINES  . HNP (herniated nucleus pulposus), lumbar   . Hx of epistaxis   . Insomnia   . OA (osteoarthritis)   . Obesity   . PCOS (polycystic ovarian syndrome)   . Sensitive skin   . Vitamin D deficiency     Past Surgical History:  Procedure Laterality Date  . LUMBAR LAMINECTOMY/DECOMPRESSION MICRODISCECTOMY Right 06/02/2014   Procedure: MICRO LUMBAR DECOMPRESSION L4 - L5 ON THE RIGHT 1 LEVEL;  Surgeon: Johnn Hai, MD;  [facility];  Service: Orthopedics;  Laterality: Right;  . ORIF HUMERUS FRACTURE Left 08/01/2017   Procedure: OPEN REDUCTION INTERNAL FIXATION (ORIF) PROXIMAL HUMERUS FRACTURE;  Surgeon: Shona Needles, MD;  Location: [facility];  Service: Orthopedics;  Laterality: Left;  . WISDOM TOOTH EXTRACTION      There were no vitals filed for this visit.  Subjective Assessment - 02/23/18 1717    Subjective  Patient reports that she had some significant pain over the weekend due to the MRI and the drive. States that today her pain was doing okay today until her ride over to therapy. Minimal to no complaints of L  shoulder this session.    Pain Score  5     Pain Location  Back    Pain Orientation  Left    Pain Descriptors / Indicators  Aching;Sharp    Pain Type  Chronic pain    Pain Onset  More than a month ago     Objective: TTP of piriformis, glutes, lumbar paraspinals  Modalities: 43mins High volt stim?(4pads),?100p 2sec?x26mins?to L posterior lumbar paraspinals and posterior hip muscles for pain management (~269mV), AAROM of L shoulder performed for the last 47mins of modalities.  Manual therapy:?x15 mins STM/IASTM to to L posterior hip muscles (glutes, piriformis), for tissue extensibility/elasticity/improved integrity  Therapeutic exercise: Patient performed with verbal/ visual cues from PT, goal: improve LUE mobility  AAROM with cane in supine (head of table elevated) x15 flexion, x15 horizontal abduction/adduction, x10 abduction in scapular plane  S/l piriformis/IT band stretch (knee bent, lowered off table) during modalities   Patient response to treatment:?Patient reported pain decreased?low back pain from 5/10, to 3.5/10, LUE from 3/10 to 2/10 at end of session. Improvement in soft tissue integrity of LUE by 25%.    PT Long Term Goals - 01/26/18 1933      PT LONG TERM GOAL #1   Title  Patient will improve FOTO score to 25/100  demonstrating improvement with functional use left UE for daily tasks    Baseline  FOTO 4/100; 09/04/17 4/100 no change due to severity of injury/surgery and continued restrictions on use/movement; 11/10/17 26/100    Status  Achieved    Target Date  12/23/17      PT LONG TERM GOAL #2   Title  Patient will improve FOTO score to 50/100 demonstrating improvement with functional use left UE for daily tasks    Baseline  FOTO 4/100; 12/10/2017 38/100 01/26/18 43/100    Time  8    Status  On-going    Target Date  03/23/18      PT LONG TERM GOAL #3   Title  patient will be independent with home exercises for flexibility and strength to allow transition to  self management once discharged from physical therapy    Baseline  requires assistance for ROM left shoulder, guidance and cuing to perform all exercises    Time  8    Period  Weeks    Status  On-going    Target Date  03/23/18      PT LONG TERM GOAL #4   Title  Patient will demonstrate lumbar range of motion to [facility] to improve ability to perform ADLs such as reaching, dressing, sitting, drivig, etc.     Time  8    Period  Weeks    Status  New    Target Date  03/23/18      PT LONG TERM GOAL #5   Title  Patient will demonstrate LE strength of at least 4+/5 to increase ease with performing functional activities such as transfers, stair navigation, walking, etc.    Time  8    Period  Weeks    Status  New    Target Date  03/23/18        Patient will benefit from skilled therapeutic intervention in order to improve the following deficits and impairments:  Pain, Impaired UE functional use, Impaired perceived functional ability, Decreased strength, Decreased range of motion, Decreased endurance, Decreased activity tolerance, Decreased balance, Obesity  Visit Diagnosis: Stiffness of left shoulder, not elsewhere classified  Acute pain of left shoulder  Chronic left-sided low back pain with left-sided sciatica  Muscle weakness (generalized)  Other muscle spasm     Problem List Patient Active Problem List   Diagnosis Date Noted  . DDD (degenerative disc disease), thoracolumbar 01/06/2018  . DVT (deep venous thrombosis) ([place]) 08/29/2017  . Leukocytosis 08/06/2017  . Humerus fracture 08/01/2017  . CPAP (continuous positive airway pressure) dependence 08/01/2017  . Asthma 08/01/2017  . HTN (hypertension) 06/24/2017  . Depression 11/07/2015  . Systolic murmur 32/67/1245  . Morbid obesity ([place]) 10/26/2014  . Insomnia 06/21/2014  . Anxiety 06/21/2014  . Spinal stenosis at L4-L5 level 06/02/2014  . POLYCYSTIC OVARIAN DISEASE 06/19/2010    Lieutenant Diego PT, DPT 6:28  PM,02/23/18 [phone]  [facility] [address], [place], [address] Phone: [phone]   Fax:  [phone]  Name: MAEVIS MUMBY MRN: [id number] Date of Birth: 08-23-1970

## 2018-02-24 ENCOUNTER — Ambulatory Visit (INDEPENDENT_AMBULATORY_CARE_PROVIDER_SITE_OTHER): Payer: BLUE CROSS/BLUE SHIELD | Admitting: Orthopaedic Surgery

## 2018-02-24 ENCOUNTER — Encounter (INDEPENDENT_AMBULATORY_CARE_PROVIDER_SITE_OTHER): Payer: Self-pay | Admitting: Orthopaedic Surgery

## 2018-02-24 VITALS — Ht 66.5 in | Wt 370.0 lb

## 2018-02-24 DIAGNOSIS — M5126 Other intervertebral disc displacement, lumbar region: Secondary | ICD-10-CM | POA: Diagnosis not present

## 2018-02-24 NOTE — Addendum Note (Signed)
Addended by: Meyer Cory on: 02/24/2018 01:55 PM   Modules accepted: Orders

## 2018-02-24 NOTE — Progress Notes (Signed)
Office Visit Note   Patient: Danielle Irwin           Date of Birth: September 19, 1970           MRN: [id number] Visit Date: 02/24/2018              Requested by: Lucille Passy, MD [place], [place] [address] PCP: Lucille Passy, MD   Assessment & Plan: Visit Diagnoses:  1. Protrusion of lumbar intervertebral disc    left L5-S1 with nerve root enhancement.  Plan: We will set up for single epidural injection left L5-S1 which in the past is always been effective for her in relief of her pain with one exception.  He does not have central stenosis.  I encouraged her to go listen to the bariatric talk which is free which would be something that would improve her back symptoms long-term.  Follow-Up Instructions: No follow-ups on file.   Orders:  No orders of the defined types were placed in this encounter.  No orders of the defined types were placed in this encounter.     Procedures: No procedures performed   Clinical Data: No additional findings.   Subjective: Chief Complaint  Patient presents with  . Lower Back - Pain, Follow-up    MRI Lumbar Review    HPI 47 year old female returns post MRI scan lumbar for review.  She continues to have considerable amount of back pain and left leg pain.  Previous right L4-5 hemilaminectomy done by Dr. Tonita Cong in 2015.  She is recovering from shoulder surgery by Dr. Doreatha Martin and is shortly to be released back to work.  She has had previous epidurals which have always worked except for the one time when it was not effective before her 2015 lumbar  disc surgery.  Review of Systems review of systems updated unchanged from 01/21/1999 1914 point system other than as mentioned above.   Objective: Vital Signs: Ht 5' 6.5" (1.689 m)   Wt (!) 370 lb (167.8 kg)   BMI 58.82 kg/m   Physical Exam  Constitutional: She is oriented to person, place, and time. She appears well-developed.  HENT:  Head: Normocephalic.  Right Ear: External  ear normal.  Left Ear: External ear normal.  Eyes: Pupils are equal, round, and reactive to light.  Neck: No tracheal deviation present. No thyromegaly present.  Cardiovascular: Normal rate.  Pulmonary/Chest: Effort normal.  Abdominal: Soft.  Morbid obesity.  Neurological: She is alert and oriented to person, place, and time.  Skin: Skin is warm and dry.  Psychiatric: She has a normal mood and affect. Her behavior is normal.    Ortho Exam patient is ambulating with a cane.  She is unable to sit in a chair and has to stand up frequently.  She complains of pain moaning and groaning from her back and left leg.  Specialty Comments:  No specialty comments available.  Imaging: CLINICAL DATA:  Severe low back pain radiating to LEFT lower extremity with numbness for 6 weeks. History of RIGHT L4-5 micro discectomy 2015.  EXAM: MRI LUMBAR SPINE WITHOUT AND WITH CONTRAST  TECHNIQUE: Multiplanar and multiecho pulse sequences of the lumbar spine were obtained without and with intravenous contrast.  CONTRAST:  11mL MULTIHANCE GADOBENATE DIMEGLUMINE 529 MG/ML IV SOLN  COMPARISON:  Lumbar spine radiographs January 12, 2018  FINDINGS: SEGMENTATION: For the purposes of this report, the last well-formed intervertebral disc is reported as L5-S1.  ALIGNMENT: Maintained lumbar lordosis. No malalignment.  VERTEBRAE:  Vertebral bodies are intact. Mild L3-4, moderate L4-5 and L5-S1 disc height loss with mild disc desiccation L3-4 through L5-S1. Mild chronic discogenic endplate changes lower lumbar spine without acute or abnormal bone marrow signal. No abnormal osseous or disc enhancement. No abnormal bone marrow signal. No abnormal osseous or intradiscal enhancement.  CONUS MEDULLARIS AND CAUDA EQUINA: Conus medullaris terminates at T12-L1 and demonstrates normal morphology and signal characteristics. Cauda equina is normal. No abnormal cord, leptomeningeal or epidural  enhancement.  PARASPINAL AND OTHER SOFT TISSUES: Nonacute. RIGHT paraspinal muscle scarring L4-5.  DISC LEVELS:  T12-L1 through L2-3: No disc bulge, canal stenosis nor neural foraminal narrowing.  L3-4: Annular bulging. Mild facet arthropathy and ligamentum flavum redundancy without canal stenosis or neural foraminal narrowing.  L4-5: RIGHT hemilaminectomy. 3 mm broad-based disc bulge with enhancing annular fissure, possibly postoperative. No canal stenosis. Mild RIGHT greater than LEFT neural foraminal narrowing.  L5-S1: 5 mm LEFT central disc protrusion posteriorly displacing the traversing LEFT S1 nerve which is enlarged, edematous and enhancing. Mild facet arthropathy without canal stenosis. Mild LEFT neural foraminal narrowing.  IMPRESSION: 1. 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 2. Status post RIGHT L4-5 hemilaminectomy with small disc protrusion and annular fissure. 3. No canal stenosis. Mild L4-5 and L5-S1 neural foraminal narrowing.   Electronically Signed   By: Elon Alas M.D.   On: 02/21/2018 23:45        PMFS History: Patient Active Problem List   Diagnosis Date Noted  . DDD (degenerative disc disease), thoracolumbar 01/06/2018  . DVT (deep venous thrombosis) ([place]) 08/29/2017  . Leukocytosis 08/06/2017  . Humerus fracture 08/01/2017  . CPAP (continuous positive airway pressure) dependence 08/01/2017  . Asthma 08/01/2017  . HTN (hypertension) 06/24/2017  . Depression 11/07/2015  . Systolic murmur 67/34/1937  . Morbid obesity ([place]) 10/26/2014  . Insomnia 06/21/2014  . Anxiety 06/21/2014  . Spinal stenosis at L4-L5 level 06/02/2014  . POLYCYSTIC OVARIAN DISEASE 06/19/2010   Past Medical History:  Diagnosis Date  . Asthma   . Depression   . Family history of adverse reaction to anesthesia    FATHER WAS SLOW TO WAKE UP  . Headache    OCCASIONAL MIGRAINES  . HNP (herniated nucleus pulposus),  lumbar   . Hx of epistaxis   . Insomnia   . OA (osteoarthritis)   . Obesity   . PCOS (polycystic ovarian syndrome)   . Sensitive skin   . Vitamin D deficiency     Family History  Problem Relation Age of Onset  . Heart attack Father 52  . Breast cancer Maternal Aunt 45    Past Surgical History:  Procedure Laterality Date  . LUMBAR LAMINECTOMY/DECOMPRESSION MICRODISCECTOMY Right 06/02/2014   Procedure: MICRO LUMBAR DECOMPRESSION L4 - L5 ON THE RIGHT 1 LEVEL;  Surgeon: Johnn Hai, MD;  [facility];  Service: Orthopedics;  Laterality: Right;  . ORIF HUMERUS FRACTURE Left 08/01/2017   Procedure: OPEN REDUCTION INTERNAL FIXATION (ORIF) PROXIMAL HUMERUS FRACTURE;  Surgeon: Shona Needles, MD;  Location: [facility];  Service: Orthopedics;  Laterality: Left;  . WISDOM TOOTH EXTRACTION     Social History   Occupational History  . Not on file  Tobacco Use  . Smoking status: Former Smoker    Last attempt to quit: 05/26/1990    Years since quitting: 27.7  . Smokeless tobacco: Never Used  Substance and Sexual Activity  . Alcohol use: No    Alcohol/week: 0.0 standard drinks  .  Drug use: No  . Sexual activity: Not on file       

## 2018-03-02 ENCOUNTER — Ambulatory Visit: Payer: BLUE CROSS/BLUE SHIELD

## 2018-03-04 ENCOUNTER — Ambulatory Visit: Payer: BLUE CROSS/BLUE SHIELD

## 2018-03-04 DIAGNOSIS — M62838 Other muscle spasm: Secondary | ICD-10-CM

## 2018-03-04 DIAGNOSIS — G8929 Other chronic pain: Secondary | ICD-10-CM

## 2018-03-04 DIAGNOSIS — M25612 Stiffness of left shoulder, not elsewhere classified: Secondary | ICD-10-CM | POA: Diagnosis not present

## 2018-03-04 DIAGNOSIS — M6281 Muscle weakness (generalized): Secondary | ICD-10-CM

## 2018-03-04 DIAGNOSIS — M5442 Lumbago with sciatica, left side: Secondary | ICD-10-CM

## 2018-03-04 DIAGNOSIS — M25512 Pain in left shoulder: Secondary | ICD-10-CM

## 2018-03-04 NOTE — Therapy (Signed)
[facility] [address] [place], [address] Phone: [phone]   Fax:  [phone]  Physical Therapy Treatment  Patient Details  Name: Danielle Irwin MRN: [id number] Date of Birth: Nov 06, 1970 Referring Provider: Katha Hamming   Encounter Date: 03/04/2018  PT End of Session - 03/04/18 1502    Visit Number  41    Number of Visits  8    Date for PT Re-Evaluation  03/23/18    Authorization Type  5/8    PT Start Time  1505    PT Stop Time  1553    PT Time Calculation (min)  48 min    Activity Tolerance  Patient tolerated treatment well    Behavior During Therapy  [facility] for tasks assessed/performed       Past Medical History:  Diagnosis Date  . Asthma   . Depression   . Family history of adverse reaction to anesthesia    FATHER WAS SLOW TO WAKE UP  . Headache    OCCASIONAL MIGRAINES  . HNP (herniated nucleus pulposus), lumbar   . Hx of epistaxis   . Insomnia   . OA (osteoarthritis)   . Obesity   . PCOS (polycystic ovarian syndrome)   . Sensitive skin   . Vitamin D deficiency     Past Surgical History:  Procedure Laterality Date  . LUMBAR LAMINECTOMY/DECOMPRESSION MICRODISCECTOMY Right 06/02/2014   Procedure: MICRO LUMBAR DECOMPRESSION L4 - L5 ON THE RIGHT 1 LEVEL;  Surgeon: Johnn Hai, MD;  [facility];  Service: Orthopedics;  Laterality: Right;  . ORIF HUMERUS FRACTURE Left 08/01/2017   Procedure: OPEN REDUCTION INTERNAL FIXATION (ORIF) PROXIMAL HUMERUS FRACTURE;  Surgeon: Shona Needles, MD;  Location: [facility];  Service: Orthopedics;  Laterality: Left;  . WISDOM TOOTH EXTRACTION      There were no vitals filed for this visit.  Subjective Assessment - 03/04/18 1505    Subjective  Patient reports that she is not that bad today, and is ambulating without her cane today. Since last PT visit patient has seen her physician with MRI results; L5-S1 disc protrusion resulting in LEFT S1 nerve  impingement. Is planning for an injection pending insurance.    Currently in Pain?  Yes    Pain Score  2     Pain Location  Shoulder   back: 4   Pain Descriptors / Indicators  Sore       Objective: Patient ambulating without SPC today, improved gait quality  Manual therapy:?x14mins STM/trigger point release to L lat, teres major/minor for improved tissue extensibility/integrity. STM/trigger point release to R hip musculature in s/l for pain/muscle tension/improve tissue integrity  Therapeutic exercise: Patient performed with verbal/ visual cues from PT, goal: improve LUE mobility  Finger walks in flexion and scapular plane x5 ea demo from PT Pulleys in sitting for shoulder elevation with scapular retractions x 7 (discontinued due to sharp pain) demo from PT, verbal cues S/l shoulder abduction x5 Supine shoulder elevation x5 Seated forward and lateral flexion b/l with therapy ball 3 x10s seated HS stretch b/l 2x30s S/l IT band/piriformis stretch 2 x30s  Patient response to treatment:?Patient reported pain decreased low back pain from 4/10, to 2/10, LUE from2/10 to 1/10 at end of session.      PT Education - 03/04/18 1553    Education provided  Yes    Education Details  therex/technique    Person(s) Educated  Patient    Methods  Explanation;Demonstration;Tactile cues;Verbal cues    Comprehension  Verbalized understanding;Returned demonstration          PT Long Term Goals - 01/26/18 1933      PT LONG TERM GOAL #1   Title  Patient will improve FOTO score to 25/100 demonstrating improvement with functional use left UE for daily tasks    Baseline  FOTO 4/100; 09/04/17 4/100 no change due to severity of injury/surgery and continued restrictions on use/movement; 11/10/17 26/100    Status  Achieved    Target Date  12/23/17      PT LONG TERM GOAL #2   Title  Patient will improve FOTO score to 50/100 demonstrating improvement with functional use left UE for daily tasks     Baseline  FOTO 4/100; 12/10/2017 38/100 01/26/18 43/100    Time  8    Status  On-going    Target Date  03/23/18      PT LONG TERM GOAL #3   Title  patient will be independent with home exercises for flexibility and strength to allow transition to self management once discharged from physical therapy    Baseline  requires assistance for ROM left shoulder, guidance and cuing to perform all exercises    Time  8    Period  Weeks    Status  On-going    Target Date  03/23/18      PT LONG TERM GOAL #4   Title  Patient will demonstrate lumbar range of motion to [facility] to improve ability to perform ADLs such as reaching, dressing, sitting, drivig, etc.     Time  8    Period  Weeks    Status  New    Target Date  03/23/18      PT LONG TERM GOAL #5   Title  Patient will demonstrate LE strength of at least 4+/5 to increase ease with performing functional activities such as transfers, stair navigation, walking, etc.    Time  8    Period  Weeks    Status  New    Target Date  03/23/18            Plan - 03/04/18 1554    Clinical Impression Statement  Patient demonstrated improved L shoulder forward elevation post session. Decreased pain in both LUE and low back pain. The patient would benefit from further skilled PT to continue to progress towards goals.    PT Frequency  2x / week    PT Duration  8 weeks    PT Treatment/Interventions  Manual techniques;Neuromuscular re-education;Patient/family education;Cryotherapy;Electrical Stimulation;Moist Heat;Therapeutic exercise;Therapeutic activities;Balance training;Scar mobilization;Passive range of motion;Iontophoresis 4mg /ml Dexamethasone;Gait training;Stair training;Dry needling;Ultrasound;Traction;Functional mobility training    Consulted and Agree with Plan of Care  Patient       Patient will benefit from skilled therapeutic intervention in order to improve the following deficits and impairments:  Pain, Impaired UE functional use, Impaired  perceived functional ability, Decreased strength, Decreased range of motion, Decreased endurance, Decreased activity tolerance, Decreased balance, Obesity  Visit Diagnosis: Stiffness of left shoulder, not elsewhere classified  Acute pain of left shoulder  Chronic left-sided low back pain with left-sided sciatica  Muscle weakness (generalized)  Other muscle spasm     Problem List Patient Active Problem List   Diagnosis Date Noted  . DDD (degenerative disc disease), thoracolumbar 01/06/2018  . DVT (deep venous thrombosis) ([place]) 08/29/2017  . Leukocytosis 08/06/2017  . Humerus fracture 08/01/2017  . CPAP (continuous positive airway pressure) dependence 08/01/2017  . Asthma  08/01/2017  . HTN (hypertension) 06/24/2017  . Depression 11/07/2015  . Systolic murmur 09/32/3557  . Morbid obesity ([place]) 10/26/2014  . Insomnia 06/21/2014  . Anxiety 06/21/2014  . Spinal stenosis at L4-L5 level 06/02/2014  . POLYCYSTIC OVARIAN DISEASE 06/19/2010    Lieutenant Diego PT, DPT 3:57 PM,03/04/18 [facility] [address] [place], [address] Phone: [phone]   Fax:  [phone]  Name: Danielle Irwin MRN: [id number] Date of Birth: Oct 13, 1970

## 2018-03-09 ENCOUNTER — Ambulatory Visit: Payer: BLUE CROSS/BLUE SHIELD

## 2018-03-09 DIAGNOSIS — G8929 Other chronic pain: Secondary | ICD-10-CM

## 2018-03-09 DIAGNOSIS — M62838 Other muscle spasm: Secondary | ICD-10-CM

## 2018-03-09 DIAGNOSIS — M25612 Stiffness of left shoulder, not elsewhere classified: Secondary | ICD-10-CM | POA: Diagnosis not present

## 2018-03-09 DIAGNOSIS — M25512 Pain in left shoulder: Secondary | ICD-10-CM

## 2018-03-09 DIAGNOSIS — M6281 Muscle weakness (generalized): Secondary | ICD-10-CM

## 2018-03-09 DIAGNOSIS — M5442 Lumbago with sciatica, left side: Secondary | ICD-10-CM

## 2018-03-09 NOTE — Therapy (Addendum)
[facility] [facility] [address], [place], [address] Phone: [phone]   Fax:  [phone]  Physical Therapy Treatment  Patient Details  Name: Danielle Irwin MRN: [id number] Date of Birth: [birth year] Referring Provider (PT): Katha Hamming   Encounter Date: 03/09/2018  PT End of Session - 03/09/18 1417    Visit Number  42    Number of Visits  8    Date for PT Re-Evaluation  03/23/18    Authorization Type  6/8    PT Start Time  1324    PT Stop Time  1430    PT Time Calculation (min)  45 min    Activity Tolerance  Patient tolerated treatment well    Behavior During Therapy  [facility] for tasks assessed/performed       Past Medical History:  Diagnosis Date  . Asthma   . Depression   . Family history of adverse reaction to anesthesia    FATHER WAS SLOW TO WAKE UP  . Headache    OCCASIONAL MIGRAINES  . HNP (herniated nucleus pulposus), lumbar   . Hx of epistaxis   . Insomnia   . OA (osteoarthritis)   . Obesity   . PCOS (polycystic ovarian syndrome)   . Sensitive skin   . Vitamin D deficiency     Past Surgical History:  Procedure Laterality Date  . LUMBAR LAMINECTOMY/DECOMPRESSION MICRODISCECTOMY Right 06/02/2014   Procedure: MICRO LUMBAR DECOMPRESSION L4 - L5 ON THE RIGHT 1 LEVEL;  Surgeon: Johnn Hai, MD;  [facility];  Service: Orthopedics;  Laterality: Right;  . ORIF HUMERUS FRACTURE Left 08/01/2017   Procedure: OPEN REDUCTION INTERNAL FIXATION (ORIF) PROXIMAL HUMERUS FRACTURE;  Surgeon: Shona Needles, MD;  Location: [facility];  Service: Orthopedics;  Laterality: Left;  . WISDOM TOOTH EXTRACTION      There were no vitals filed for this visit.  Subjective Assessment - 03/09/18 1347    Subjective  Patient reports that her pain is worse today. States she was standing in [employer] ~73mins which started to significantly increase her low back, and radicular pain on the L.    Currently in Pain?  Yes    Pain Score  7     Pain Location  Back    Pain Descriptors / Indicators  Shooting;Sharp    Pain Type  Chronic pain    Pain Onset  More than a month ago      Objective: moderate antalgic gait today, ambulated with SPC. Mild gait quality improvement after PT session.  Manual therapy:?x101mins  STM superficial techniques to R hip musculature in s/l for pain/muscle tension/improve tissue integrity  Therapeutic exercise: Patient performed with verbal/ visual cues from PT, goal: improve LUE mobility  Supine shoulder elevation x5 S/l shoulder abduction isometrics 10x10s during modalities S/l shoulder flexion isometrics 10x10s during modalities S/l shoulder abduction to ~90degs with scapular retraction 2x10 during modalities S/l shoulder flexion 2x10 with scapular retraction during modalities S/l sciatic nerve flossing with PT assist 2-3 bouts x 10 ea in varying degrees of hip flexion  Modalities: High volt stim?(4pads),?100p 2sec?x37mins?to L posterior lumbar paraspinals and posterior hip muscles for pain management (~268mV). LUE therapeutic exercises performed while high volt was in place.   Patient response to treatment:?Patient reported pain decreased low back pain from 7/10, to 4/10. LUE only pain with shoulder elevation/abduction greater than 90deg.   PT Education - 03/09/18 1415    Education provided  Yes    Education  Details  therex/technique    Person(s) Educated  Patient    Methods  Demonstration;Explanation;Tactile cues;Verbal cues    Comprehension  Verbalized understanding;Returned demonstration          PT Long Term Goals - 01/26/18 1933      PT LONG TERM GOAL #1   Title  Patient will improve FOTO score to 25/100 demonstrating improvement with functional use left UE for daily tasks    Baseline  FOTO 4/100; 09/04/17 4/100 no change due to severity of injury/surgery and continued restrictions on use/movement; 11/10/17 26/100    Status  Achieved    Target Date   12/23/17      PT LONG TERM GOAL #2   Title  Patient will improve FOTO score to 50/100 demonstrating improvement with functional use left UE for daily tasks    Baseline  FOTO 4/100; 12/10/2017 38/100 01/26/18 43/100    Time  8    Status  On-going    Target Date  03/23/18      PT LONG TERM GOAL #3   Title  patient will be independent with home exercises for flexibility and strength to allow transition to self management once discharged from physical therapy    Baseline  requires assistance for ROM left shoulder, guidance and cuing to perform all exercises    Time  8    Period  Weeks    Status  On-going    Target Date  03/23/18      PT LONG TERM GOAL #4   Title  Patient will demonstrate lumbar range of motion to [facility] to improve ability to perform ADLs such as reaching, dressing, sitting, drivig, etc.     Time  8    Period  Weeks    Status  New    Target Date  03/23/18      PT LONG TERM GOAL #5   Title  Patient will demonstrate LE strength of at least 4+/5 to increase ease with performing functional activities such as transfers, stair navigation, walking, etc.    Time  8    Period  Weeks    Status  New    Target Date  03/23/18            Plan - 03/09/18 1416    Clinical Impression Statement  Patient still presents with pain with L arm AROM, especially abduction above 90deg. Patient also continues to demonstrate limitations with functional activities such as transfers, ambulation, standing due to increased LBP L> R.    PT Frequency  2x / week    PT Duration  8 weeks    PT Treatment/Interventions  Manual techniques;Neuromuscular re-education;Patient/family education;Cryotherapy;Electrical Stimulation;Moist Heat;Therapeutic exercise;Therapeutic activities;Balance training;Scar mobilization;Passive range of motion;Iontophoresis 4mg /ml Dexamethasone;Gait training;Stair training;Dry needling;Ultrasound;Traction;Functional mobility training    PT Next Visit Plan  pain control,  progressive exercises PROM/AAROM shoulder and AROM elbow to hand to improve control/strength left UE; iontophoresis 4mg /ml Dexamethasone    PT Home Exercise Plan  pendulum exercise for shoulder; AAROM left shoulder supine, isometric hold at 90 degrees elevation 90 degrees; active shoulder forward elevation and abduction, ER right shoulder, AAROM cane flexion       Patient will benefit from skilled therapeutic intervention in order to improve the following deficits and impairments:  Pain, Impaired UE functional use, Impaired perceived functional ability, Decreased strength, Decreased range of motion, Decreased endurance, Decreased activity tolerance, Decreased balance, Obesity  Visit Diagnosis: Stiffness of left shoulder, not elsewhere classified  Acute pain of left shoulder  Chronic  left-sided low back pain with left-sided sciatica  Muscle weakness (generalized)  Other muscle spasm     Problem List Patient Active Problem List   Diagnosis Date Noted  . DDD (degenerative disc disease), thoracolumbar 01/06/2018  . DVT (deep venous thrombosis) ([place]) 08/29/2017  . Leukocytosis 08/06/2017  . Humerus fracture 08/01/2017  . CPAP (continuous positive airway pressure) dependence 08/01/2017  . Asthma 08/01/2017  . HTN (hypertension) 06/24/2017  . Depression 11/07/2015  . Systolic murmur 27/12/8673  . Morbid obesity ([place]) 10/26/2014  . Insomnia 06/21/2014  . Anxiety 06/21/2014  . Spinal stenosis at L4-L5 level 06/02/2014  . POLYCYSTIC OVARIAN DISEASE 06/19/2010    Lieutenant Diego PT, DPT 2:34 PM,03/09/18 [facility] [address] [place], [address] Phone: [phone]   Fax:  [phone]  Name: Danielle Irwin MRN: [id number] Date of Birth: Nov 25, 1970

## 2018-03-11 ENCOUNTER — Ambulatory Visit: Payer: BLUE CROSS/BLUE SHIELD | Attending: Student

## 2018-03-11 DIAGNOSIS — M6281 Muscle weakness (generalized): Secondary | ICD-10-CM | POA: Diagnosis present

## 2018-03-11 DIAGNOSIS — M62838 Other muscle spasm: Secondary | ICD-10-CM | POA: Diagnosis present

## 2018-03-11 DIAGNOSIS — M5442 Lumbago with sciatica, left side: Secondary | ICD-10-CM | POA: Insufficient documentation

## 2018-03-11 DIAGNOSIS — M25512 Pain in left shoulder: Secondary | ICD-10-CM | POA: Diagnosis present

## 2018-03-11 DIAGNOSIS — M25612 Stiffness of left shoulder, not elsewhere classified: Secondary | ICD-10-CM | POA: Diagnosis present

## 2018-03-11 DIAGNOSIS — G8929 Other chronic pain: Secondary | ICD-10-CM | POA: Diagnosis present

## 2018-03-11 NOTE — Therapy (Signed)
[facility] [address], [place], [address] Phone: [phone]   Fax:  [phone]  Physical Therapy Treatment  Patient Details  Name: Danielle Irwin MRN: [id number] Date of Birth: 08-Sep-1970 Referring Provider (PT): Katha Hamming   Encounter Date: 03/11/2018  PT End of Session - 03/11/18 1259    Visit Number  43    Number of Visits  8    Date for PT Re-Evaluation  03/23/18    Authorization Type  7/8    PT Start Time  1300    PT Stop Time  1345    PT Time Calculation (min)  45 min    Activity Tolerance  Patient tolerated treatment well    Behavior During Therapy  [facility] for tasks assessed/performed       Past Medical History:  Diagnosis Date  . Asthma   . Depression   . Family history of adverse reaction to anesthesia    FATHER WAS SLOW TO WAKE UP  . Headache    OCCASIONAL MIGRAINES  . HNP (herniated nucleus pulposus), lumbar   . Hx of epistaxis   . Insomnia   . OA (osteoarthritis)   . Obesity   . PCOS (polycystic ovarian syndrome)   . Sensitive skin   . Vitamin D deficiency     Past Surgical History:  Procedure Laterality Date  . LUMBAR LAMINECTOMY/DECOMPRESSION MICRODISCECTOMY Right 06/02/2014   Procedure: MICRO LUMBAR DECOMPRESSION L4 - L5 ON THE RIGHT 1 LEVEL;  Surgeon: Johnn Hai, MD;  [facility];  Service: Orthopedics;  Laterality: Right;  . ORIF HUMERUS FRACTURE Left 08/01/2017   Procedure: OPEN REDUCTION INTERNAL FIXATION (ORIF) PROXIMAL HUMERUS FRACTURE;  Surgeon: Shona Needles, MD;  Location: [facility];  Service: Orthopedics;  Laterality: Left;  . WISDOM TOOTH EXTRACTION      There were no vitals filed for this visit.  Subjective Assessment - 03/11/18 1259    Subjective  Pt states that she is having 4/10 low back pain at this time. HEP going well at home without specific questions or concerns.     Pertinent History  Patient reports she fell into a storm door at neighbors  after stubbing toe on step 07/30/17. She felt immediate pain and numbness left UE and she could not move her left UE. She was transported to ED and then transported to [facility] in [place] and underwent surgery with ORIF 08/01/17 and released to home 08/03/17.  Patient now has a referral to include low back pain. Patient is 47 yo female that presents with acute on chronic LBP that started June 16th after a prolonged car ride to [facility]. Past lumbar laminectomy/decompression microdisectomy 06/02/14. States her pain has been worse since traveling and has developed pain that radiates from low back to L foot. Complains of new onset of numbness for the last week, says the numbness can vary, but is always present now.  States the sciatica on the R side pain was similar, but was never this severe. She has had steroid shots to the spine in the past but none recently. Sitting tolerance is 85mins or less, significant pain with ADLs standing tolerance ~20-64mins. Reports relieving factors are laying down, rest.    Limitations  Lifting;Walking;House hold activities    Patient Stated Goals  to be able to use her arm again for personal care, return to all ADL's with minimal difficutly    Currently in Pain?  Yes    Pain  Score  4     Pain Location  Back    Pain Orientation  Left    Pain Descriptors / Indicators  Shooting;Sharp    Pain Type  Chronic pain    Pain Onset  More than a month ago          TREATMENT  Manual Therapy  STM superficial techniques to R hip musculature in s/l for pain/muscle tension/improve tissue integrity  Ther-ex Warm-up on NuStep L1 x 4 minutes during history; Supine L shoulder forward flexion x 10, x 3 stopped during second set due to pain; Supine manually resisted L serratus punch 2 x 10; R s/l L shoulder abduction to 90 degrees x 10; R s/l L shoulder flexion with asisst by therapist 2x10; R s/l L shoulder ER with gentle manual resistance 2 x 10; R s/l L scapular  depression/retraction x 10 with tactile cues;  E-stim High volt stim (4pads), 100p 2sec x 43mins to L posterior lumbar paraspinals and posterior hip muscles for pain management. LUE therapeutic exercises performed while high volt was in place.    Pt educated throughout session about proper posture and technique with exercises. Improved exercise technique, movement at target joints, use of target muscles after min to mod verbal, visual, tactile cues.    Patient also continues to demonstrate limitations with functional activities such as transfers, ambulation, standing due to increased LBP L> R. She reports increase in pain with end range AROM of L shoulder. She reports improvement in pain with STM and e-stim. Pt encouraged to continue HEP and follow-up as scheduled.                          PT Long Term Goals - 01/26/18 1933      PT LONG TERM GOAL #1   Title  Patient will improve FOTO score to 25/100 demonstrating improvement with functional use left UE for daily tasks    Baseline  FOTO 4/100; 09/04/17 4/100 no change due to severity of injury/surgery and continued restrictions on use/movement; 11/10/17 26/100    Status  Achieved    Target Date  12/23/17      PT LONG TERM GOAL #2   Title  Patient will improve FOTO score to 50/100 demonstrating improvement with functional use left UE for daily tasks    Baseline  FOTO 4/100; 12/10/2017 38/100 01/26/18 43/100    Time  8    Status  On-going    Target Date  03/23/18      PT LONG TERM GOAL #3   Title  patient will be independent with home exercises for flexibility and strength to allow transition to self management once discharged from physical therapy    Baseline  requires assistance for ROM left shoulder, guidance and cuing to perform all exercises    Time  8    Period  Weeks    Status  On-going    Target Date  03/23/18      PT LONG TERM GOAL #4   Title  Patient will demonstrate lumbar range of motion to [facility] to  improve ability to perform ADLs such as reaching, dressing, sitting, drivig, etc.     Time  8    Period  Weeks    Status  New    Target Date  03/23/18      PT LONG TERM GOAL #5   Title  Patient will demonstrate LE strength of at least 4+/5 to increase ease with  performing functional activities such as transfers, stair navigation, walking, etc.    Time  8    Period  Weeks    Status  New    Target Date  03/23/18            Plan - 03/11/18 1300    Clinical Impression Statement  Patient also continues to demonstrate limitations with functional activities such as transfers, ambulation, standing due to increased LBP L> R. She reports increase in pain with end range AROM of L shoulder. She reports improvement in pain with STM and e-stim. Pt encouraged to continue HEP and follow-up as scheduled.    PT Frequency  2x / week    PT Duration  8 weeks    PT Treatment/Interventions  Manual techniques;Neuromuscular re-education;Patient/family education;Cryotherapy;Electrical Stimulation;Moist Heat;Therapeutic exercise;Therapeutic activities;Balance training;Scar mobilization;Passive range of motion;Iontophoresis 4mg /ml Dexamethasone;Gait training;Stair training;Dry needling;Ultrasound;Traction;Functional mobility training    PT Next Visit Plan  pain control, progressive exercises PROM/AAROM shoulder and AROM elbow to hand to improve control/strength left UE; iontophoresis 4mg /ml Dexamethasone    PT Home Exercise Plan  pendulum exercise for shoulder; AAROM left shoulder supine, isometric hold at 90 degrees elevation 90 degrees; active shoulder forward elevation and abduction, ER right shoulder, AAROM cane flexion       Patient will benefit from skilled therapeutic intervention in order to improve the following deficits and impairments:  Pain, Impaired UE functional use, Impaired perceived functional ability, Decreased strength, Decreased range of motion, Decreased endurance, Decreased activity  tolerance, Decreased balance, Obesity  Visit Diagnosis: Stiffness of left shoulder, not elsewhere classified  Acute pain of left shoulder  Chronic left-sided low back pain with left-sided sciatica     Problem List Patient Active Problem List   Diagnosis Date Noted  . DDD (degenerative disc disease), thoracolumbar 01/06/2018  . DVT (deep venous thrombosis) ([place]) 08/29/2017  . Leukocytosis 08/06/2017  . Humerus fracture 08/01/2017  . CPAP (continuous positive airway pressure) dependence 08/01/2017  . Asthma 08/01/2017  . HTN (hypertension) 06/24/2017  . Depression 11/07/2015  . Systolic murmur 85/88/5027  . Morbid obesity ([place]) 10/26/2014  . Insomnia 06/21/2014  . Anxiety 06/21/2014  . Spinal stenosis at L4-L5 level 06/02/2014  . POLYCYSTIC OVARIAN DISEASE 06/19/2010   Danielle Irwin PT, DPT, GCS  Danielle Irwin 03/12/2018, 12:58 PM  [facility] [address], [place], [address] Phone: [phone]   Fax:  [phone]  Name: Danielle Irwin MRN: [id number] Date of Birth: 08/29/1970

## 2018-03-16 ENCOUNTER — Ambulatory Visit: Payer: BLUE CROSS/BLUE SHIELD

## 2018-03-17 ENCOUNTER — Ambulatory Visit: Payer: BLUE CROSS/BLUE SHIELD

## 2018-03-17 DIAGNOSIS — M25512 Pain in left shoulder: Secondary | ICD-10-CM

## 2018-03-17 DIAGNOSIS — M6281 Muscle weakness (generalized): Secondary | ICD-10-CM

## 2018-03-17 DIAGNOSIS — M62838 Other muscle spasm: Secondary | ICD-10-CM

## 2018-03-17 DIAGNOSIS — G8929 Other chronic pain: Secondary | ICD-10-CM

## 2018-03-17 DIAGNOSIS — M5442 Lumbago with sciatica, left side: Secondary | ICD-10-CM

## 2018-03-17 DIAGNOSIS — M25612 Stiffness of left shoulder, not elsewhere classified: Secondary | ICD-10-CM

## 2018-03-17 NOTE — Therapy (Signed)
[facility] [address], [place], [address] Phone: [phone]   Fax:  [phone]  Physical Therapy Treatment  Patient Details  Name: Danielle Irwin MRN: [id number] Date of Birth: 1971-05-25 Referring Provider (PT): Katha Hamming   Encounter Date: 03/17/2018  PT End of Session - 03/17/18 1433    Visit Number  44    Number of Visits  8    Date for PT Re-Evaluation  03/23/18    PT Start Time  2800    PT Stop Time  1516    PT Time Calculation (min)  41 min    Activity Tolerance  Patient tolerated treatment well    Behavior During Therapy  [facility] for tasks assessed/performed       Past Medical History:  Diagnosis Date  . Asthma   . Depression   . Family history of adverse reaction to anesthesia    FATHER WAS SLOW TO WAKE UP  . Headache    OCCASIONAL MIGRAINES  . HNP (herniated nucleus pulposus), lumbar   . Hx of epistaxis   . Insomnia   . OA (osteoarthritis)   . Obesity   . PCOS (polycystic ovarian syndrome)   . Sensitive skin   . Vitamin D deficiency     Past Surgical History:  Procedure Laterality Date  . LUMBAR LAMINECTOMY/DECOMPRESSION MICRODISCECTOMY Right 06/02/2014   Procedure: MICRO LUMBAR DECOMPRESSION L4 - L5 ON THE RIGHT 1 LEVEL;  Surgeon: Johnn Hai, MD;  Location: [facility];  Service: Orthopedics;  Laterality: Right;  . ORIF HUMERUS FRACTURE Left 08/01/2017   Procedure: OPEN REDUCTION INTERNAL FIXATION (ORIF) PROXIMAL HUMERUS FRACTURE;  Surgeon: Shona Needles, MD;  Location: [facility];  Service: Orthopedics;  Laterality: Left;  . WISDOM TOOTH EXTRACTION      There were no vitals filed for this visit.  Subjective Assessment - 03/17/18 1434    Subjective  Patient reports that her pain was this morning was a 7/10, she reports she took medication this morning.     Currently in Pain?  Yes    Pain Score  3     Pain Location  Back    Pain Orientation  Left    Pain Descriptors /  Indicators  Sharp    Pain Radiating Towards  to LLE into foot, especially calf    Pain Onset  More than a month ago    Pain Frequency  Constant      TREATMENT    Ther-ex: Patient completed exercises with verbal/tactile cues from PT for proper technique/form. Improved technique/form with repetition.  Seated L shoulder AAROM x20 with pulley  AROM L shoulder x10 in supine Rhymic Stabilization in 90/90 on L shoulder 3x20s R s/l L shoulder abduction to 90 degrees x 10 x10 with PT assist; R s/l L shoulder ER with gentle manual resistance 2 x 10; seated scapular depression/retraction x 10 with verbal cues and RTB;  Supine LTR 10x3s Hip adduction with ball 15x3s Hip abduction isometric with belt 15x3s Nerve flossing on LLE x10 in varying degrees of hip flexion S/l hip ITband/posterior hip stretch stretch (bent knee off table) 3x30s   Pt response to treatment: Patient had no complaints of LUE pain with exercises today. Reported LBP improved from 3/10 to 2/10, LUE fatigued but no increased pain at end of session.     PT Education - 03/17/18 1451    Education provided  Yes    Education Details  therex/technique  Person(s) Educated  Patient    Methods  Explanation;Verbal cues;Tactile cues    Comprehension  Verbalized understanding;Returned demonstration          PT Long Term Goals - 01/26/18 1933      PT LONG TERM GOAL #1   Title  Patient will improve FOTO score to 25/100 demonstrating improvement with functional use left UE for daily tasks    Baseline  FOTO 4/100; 09/04/17 4/100 no change due to severity of injury/surgery and continued restrictions on use/movement; 11/10/17 26/100    Status  Achieved    Target Date  12/23/17      PT LONG TERM GOAL #2   Title  Patient will improve FOTO score to 50/100 demonstrating improvement with functional use left UE for daily tasks    Baseline  FOTO 4/100; 12/10/2017 38/100 01/26/18 43/100    Time  8    Status  On-going    Target Date   03/23/18      PT LONG TERM GOAL #3   Title  patient will be independent with home exercises for flexibility and strength to allow transition to self management once discharged from physical therapy    Baseline  requires assistance for ROM left shoulder, guidance and cuing to perform all exercises    Time  8    Period  Weeks    Status  On-going    Target Date  03/23/18      PT LONG TERM GOAL #4   Title  Patient will demonstrate lumbar range of motion to [facility] to improve ability to perform ADLs such as reaching, dressing, sitting, drivig, etc.     Time  8    Period  Weeks    Status  New    Target Date  03/23/18      PT LONG TERM GOAL #5   Title  Patient will demonstrate LE strength of at least 4+/5 to increase ease with performing functional activities such as transfers, stair navigation, walking, etc.    Time  8    Period  Weeks    Status  New    Target Date  03/23/18            Plan - 03/17/18 1518    Clinical Impression Statement  Patient had good tolerance to activity today, reporting improvement in back symptoms at end of session. Demonstrated improved LUE AROM and AROM compared to previous sessions.     PT Frequency  2x / week    PT Duration  8 weeks    PT Treatment/Interventions  Manual techniques;Neuromuscular re-education;Patient/family education;Cryotherapy;Electrical Stimulation;Moist Heat;Therapeutic exercise;Therapeutic activities;Balance training;Scar mobilization;Passive range of motion;Iontophoresis 4mg /ml Dexamethasone;Gait training;Stair training;Dry needling;Ultrasound;Traction;Functional mobility training    PT Next Visit Plan  low back pain management, assess response to injection, LUE mobility/strengthening     PT Home Exercise Plan  pendulum exercise for shoulder; AAROM left shoulder supine, isometric hold at 90 degrees elevation 90 degrees; active shoulder forward elevation and abduction, ER right shoulder, AAROM cane flexion       Patient will benefit  from skilled therapeutic intervention in order to improve the following deficits and impairments:  Pain, Impaired UE functional use, Impaired perceived functional ability, Decreased strength, Decreased range of motion, Decreased endurance, Decreased activity tolerance, Decreased balance, Obesity  Visit Diagnosis: Stiffness of left shoulder, not elsewhere classified  Acute pain of left shoulder  Chronic left-sided low back pain with left-sided sciatica  Muscle weakness (generalized)  Other muscle spasm     Problem  List Patient Active Problem List   Diagnosis Date Noted  . DDD (degenerative disc disease), thoracolumbar 01/06/2018  . DVT (deep venous thrombosis) ([place]) 08/29/2017  . Leukocytosis 08/06/2017  . Humerus fracture 08/01/2017  . CPAP (continuous positive airway pressure) dependence 08/01/2017  . Asthma 08/01/2017  . HTN (hypertension) 06/24/2017  . Depression 11/07/2015  . Systolic murmur 89/79/1504  . Morbid obesity ([place]) 10/26/2014  . Insomnia 06/21/2014  . Anxiety 06/21/2014  . Spinal stenosis at L4-L5 level 06/02/2014  . POLYCYSTIC OVARIAN DISEASE 06/19/2010   Lieutenant Diego PT, DPT 3:21 PM,03/17/18 [facility] [address], [place], [address] Phone: [phone]   Fax:  [phone]  Name: Danielle Irwin MRN: [id number] Date of Birth: 1970/10/19

## 2018-03-31 ENCOUNTER — Encounter (INDEPENDENT_AMBULATORY_CARE_PROVIDER_SITE_OTHER): Payer: Self-pay | Admitting: Physical Medicine and Rehabilitation

## 2018-03-31 ENCOUNTER — Ambulatory Visit (INDEPENDENT_AMBULATORY_CARE_PROVIDER_SITE_OTHER): Payer: Self-pay

## 2018-03-31 ENCOUNTER — Ambulatory Visit (INDEPENDENT_AMBULATORY_CARE_PROVIDER_SITE_OTHER): Payer: BLUE CROSS/BLUE SHIELD | Admitting: Physical Medicine and Rehabilitation

## 2018-03-31 VITALS — BP 145/84 | HR 104 | Temp 98.2°F

## 2018-03-31 DIAGNOSIS — M5416 Radiculopathy, lumbar region: Secondary | ICD-10-CM

## 2018-03-31 MED ORDER — METHYLPREDNISOLONE ACETATE 80 MG/ML IJ SUSP
80.0000 mg | Freq: Once | INTRAMUSCULAR | Status: AC
Start: 2018-03-31 — End: 2018-03-31
  Administered 2018-03-31: 80 mg

## 2018-03-31 NOTE — Procedures (Signed)
S1 Lumbosacral Transforaminal Epidural Steroid Injection - Sub-Pedicular Approach with Fluoroscopic Guidance   Patient: Danielle Irwin      Date of Birth: 10/10/1970 MRN: [id number] PCP: Lucille Passy, MD      Visit Date: 03/31/2018   Universal Protocol:    Date/Time: 10/22/195:15 PM  Consent Given By: the patient  Position:  PRONE  Additional Comments: Vital signs were monitored before and after the procedure. Patient was prepped and draped in the usual sterile fashion. The correct patient, procedure, and site was verified.   Injection Procedure Details:  Procedure Site One Meds Administered:  Meds ordered this encounter  Medications  . methylPREDNISolone acetate (DEPO-MEDROL) injection 80 mg    Laterality: Left  Location/Site:  S1 Foramen   Needle size: 22 ga.  Needle type: Spinal  Needle Placement: Transforaminal  Findings:   -Comments: Excellent flow of contrast along the nerve and into the epidural space.  Procedure Details: After squaring off the sacral end-plate to get a true AP view, the C-arm was positioned so that the best possible view of the S1 foramen was visualized. The soft tissues overlying this structure were infiltrated with 2-3 ml. of 1% Lidocaine without Epinephrine.    The spinal needle was inserted toward the target using a "trajectory" view along the fluoroscope beam.  Under AP and lateral visualization, the needle was advanced so it did not puncture dura. Biplanar projections were used to confirm position. Aspiration was confirmed to be negative for CSF and/or blood. A 1-2 ml. volume of Isovue-250 was injected and flow of contrast was noted at each level. Radiographs were obtained for documentation purposes.   After attaining the desired flow of contrast documented above, a 0.5 to 1.0 ml test dose of 0.25% Marcaine was injected into each respective transforaminal space.  The patient was observed for 90 seconds post injection.  After no sensory  deficits were reported, and normal lower extremity motor function was noted,   the above injectate was administered so that equal amounts of the injectate were placed at each foramen (level) into the transforaminal epidural space.   Additional Comments:  The patient tolerated the procedure well Dressing: Band-Aid    Post-procedure details: Patient was observed during the procedure. Post-procedure instructions were reviewed.  Patient left the clinic in stable condition.

## 2018-03-31 NOTE — Progress Notes (Signed)
 .  Numeric Pain Rating Scale and Functional Assessment Average Pain 7   In the last MONTH (on 0-10 scale) has pain interfered with the following?  1. General activity like being  able to carry out your everyday physical activities such as walking, climbing stairs, carrying groceries, or moving a chair?  Rating(5)   +Driver, -BT, -Dye Allergies.  

## 2018-03-31 NOTE — Progress Notes (Signed)
Danielle Irwin - 47 y.o. female MRN [id number]  Date of birth: Aug 05, 1970  Office Visit Note: Visit Date: 03/31/2018 PCP: Lucille Passy, MD Referred by: Lucille Passy, MD  Subjective: Chief Complaint  Patient presents with  . Left Leg - Pain  . Lower Back - Pain  . Left Foot - Numbness   HPI:  Danielle Irwin is a 46 y.o. female who comes in today For planned left S1 transforaminal steroid injection at the request of Dr. Rodell Perna.  MRI is reviewed below with disc herniation at L5-S1 likely affecting the S1 nerve root and she does have concordant pain complaints.  She has had epidural injection in the past at another facility.  Interestingly she does have an allergy to benzalkonium chloride which is a preservative and Celestone.  We will go ahead and use Depo-Medrol today.  ROS Otherwise per HPI.  Assessment & Plan: Visit Diagnoses:  1. Lumbar radiculopathy     Plan: No additional findings.   Meds & Orders:  Meds ordered this encounter  Medications  . methylPREDNISolone acetate (DEPO-MEDROL) injection 80 mg    Orders Placed This Encounter  Procedures  . XR C-ARM NO REPORT  . Epidural Steroid injection    Follow-up: Return if symptoms worsen or fail to improve, for Rodell Perna, MD.   Procedures: No procedures performed  S1 Lumbosacral Transforaminal Epidural Steroid Injection - Sub-Pedicular Approach with Fluoroscopic Guidance   Patient: Danielle Irwin      Date of Birth: May 06, 1971 MRN: [id number] PCP: Lucille Passy, MD      Visit Date: 03/31/2018   Universal Protocol:    Date/Time: 10/22/195:15 PM  Consent Given By: the patient  Position:  PRONE  Additional Comments: Vital signs were monitored before and after the procedure. Patient was prepped and draped in the usual sterile fashion. The correct patient, procedure, and site was verified.   Injection Procedure Details:  Procedure Site One Meds Administered:  Meds ordered this encounter  Medications  .  methylPREDNISolone acetate (DEPO-MEDROL) injection 80 mg    Laterality: Left  Location/Site:  S1 Foramen   Needle size: 22 ga.  Needle type: Spinal  Needle Placement: Transforaminal  Findings:   -Comments: Excellent flow of contrast along the nerve and into the epidural space.  Procedure Details: After squaring off the sacral end-plate to get a true AP view, the C-arm was positioned so that the best possible view of the S1 foramen was visualized. The soft tissues overlying this structure were infiltrated with 2-3 ml. of 1% Lidocaine without Epinephrine.    The spinal needle was inserted toward the target using a "trajectory" view along the fluoroscope beam.  Under AP and lateral visualization, the needle was advanced so it did not puncture dura. Biplanar projections were used to confirm position. Aspiration was confirmed to be negative for CSF and/or blood. A 1-2 ml. volume of Isovue-250 was injected and flow of contrast was noted at each level. Radiographs were obtained for documentation purposes.   After attaining the desired flow of contrast documented above, a 0.5 to 1.0 ml test dose of 0.25% Marcaine was injected into each respective transforaminal space.  The patient was observed for 90 seconds post injection.  After no sensory deficits were reported, and normal lower extremity motor function was noted,   the above injectate was administered so that equal amounts of the injectate were placed at each foramen (level) into the transforaminal epidural space.   Additional Comments:  The patient tolerated the procedure well Dressing: Band-Aid    Post-procedure details: Patient was observed during the procedure. Post-procedure instructions were reviewed.  Patient left the clinic in stable condition.    Clinical History: MRI LUMBAR SPINE WITHOUT AND WITH CONTRAST  TECHNIQUE: Multiplanar and multiecho pulse sequences of the lumbar spine were obtained without and with  intravenous contrast.  CONTRAST:  70mL MULTIHANCE GADOBENATE DIMEGLUMINE 529 MG/ML IV SOLN  COMPARISON:  Lumbar spine radiographs January 12, 2018  FINDINGS: SEGMENTATION: For the purposes of this report, the last well-formed intervertebral disc is reported as L5-S1.  ALIGNMENT: Maintained lumbar lordosis. No malalignment.  VERTEBRAE: Vertebral bodies are intact. Mild L3-4, moderate L4-5 and L5-S1 disc height loss with mild disc desiccation L3-4 through L5-S1. Mild chronic discogenic endplate changes lower lumbar spine without acute or abnormal bone marrow signal. No abnormal osseous or disc enhancement. No abnormal bone marrow signal. No abnormal osseous or intradiscal enhancement.  CONUS MEDULLARIS AND CAUDA EQUINA: Conus medullaris terminates at T12-L1 and demonstrates normal morphology and signal characteristics. Cauda equina is normal. No abnormal cord, leptomeningeal or epidural enhancement.  PARASPINAL AND OTHER SOFT TISSUES: Nonacute. RIGHT paraspinal muscle scarring L4-5.  DISC LEVELS:  T12-L1 through L2-3: No disc bulge, canal stenosis nor neural foraminal narrowing.  L3-4: Annular bulging. Mild facet arthropathy and ligamentum flavum redundancy without canal stenosis or neural foraminal narrowing.  L4-5: RIGHT hemilaminectomy. 3 mm broad-based disc bulge with enhancing annular fissure, possibly postoperative. No canal stenosis. Mild RIGHT greater than LEFT neural foraminal narrowing.  L5-S1: 5 mm LEFT central disc protrusion posteriorly displacing the traversing LEFT S1 nerve which is enlarged, edematous and enhancing. Mild facet arthropathy without canal stenosis. Mild LEFT neural foraminal narrowing.  IMPRESSION: 1. 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 2. Status post RIGHT L4-5 hemilaminectomy with small disc protrusion and annular fissure. 3. No canal stenosis. Mild L4-5 and L5-S1 neural  foraminal narrowing.   Electronically Signed   By: Elon Alas M.D.   On: 02/21/2018 23:45     Objective:  VS:  HT:    WT:   BMI:     BP:(!) 145/84  HR:(!) 104bpm  TEMP:98.2 F (36.8 C)(Oral)  RESP:  Physical Exam  Ortho Exam Imaging: Xr C-arm No Report  Result Date: 03/31/2018 Please see Notes tab for imaging impression.

## 2018-03-31 NOTE — Patient Instructions (Signed)

## 2018-04-07 ENCOUNTER — Encounter: Payer: Self-pay | Admitting: Physical Therapy

## 2018-04-07 ENCOUNTER — Ambulatory Visit: Payer: BLUE CROSS/BLUE SHIELD | Admitting: Physical Therapy

## 2018-04-07 DIAGNOSIS — M5442 Lumbago with sciatica, left side: Secondary | ICD-10-CM

## 2018-04-07 DIAGNOSIS — M25512 Pain in left shoulder: Secondary | ICD-10-CM

## 2018-04-07 DIAGNOSIS — M25612 Stiffness of left shoulder, not elsewhere classified: Secondary | ICD-10-CM

## 2018-04-07 DIAGNOSIS — M62838 Other muscle spasm: Secondary | ICD-10-CM

## 2018-04-07 DIAGNOSIS — M6281 Muscle weakness (generalized): Secondary | ICD-10-CM

## 2018-04-07 DIAGNOSIS — G8929 Other chronic pain: Secondary | ICD-10-CM

## 2018-04-07 NOTE — Therapy (Signed)
[facility] [address], [place], [address] Phone: [phone]   Fax:  [phone]  Physical Therapy Treatment, Progress Note, and Re-Certification Reporting period: 01/26/2018 through 04/07/2018  Patient Details  Name: Danielle Irwin MRN: [id number] Date of Birth: 01-12-1971 Referring Provider (PT): Katha Hamming   Encounter Date: 04/07/2018  PT End of Session - 04/07/18 1826    Visit Number  45    Number of Visits  8    Date for PT Re-Evaluation  05/19/18    Authorization Type  7/8    Authorization Time Period  Current cert period: 50/02/3817 - 05/19/2018 (last progress note 04/07/2018)    PT Start Time  1345    PT Stop Time  1445    PT Time Calculation (min)  60 min    Activity Tolerance  Patient tolerated treatment well;Patient limited by pain;Patient limited by fatigue    Behavior During Therapy  [facility] for tasks assessed/performed       Past Medical History:  Diagnosis Date  . Asthma   . Depression   . Family history of adverse reaction to anesthesia    FATHER WAS SLOW TO WAKE UP  . Headache    OCCASIONAL MIGRAINES  . HNP (herniated nucleus pulposus), lumbar   . Hx of epistaxis   . Insomnia   . OA (osteoarthritis)   . Obesity   . PCOS (polycystic ovarian syndrome)   . Sensitive skin   . Vitamin D deficiency     Past Surgical History:  Procedure Laterality Date  . LUMBAR LAMINECTOMY/DECOMPRESSION MICRODISCECTOMY Right 06/02/2014   Procedure: MICRO LUMBAR DECOMPRESSION L4 - L5 ON THE RIGHT 1 LEVEL;  Surgeon: Johnn Hai, MD;  Location: [facility];  Service: Orthopedics;  Laterality: Right;  . ORIF HUMERUS FRACTURE Left 08/01/2017   Procedure: OPEN REDUCTION INTERNAL FIXATION (ORIF) PROXIMAL HUMERUS FRACTURE;  Surgeon: Shona Needles, MD;  Location: [place];  Service: Orthopedics;  Laterality: Left;  . WISDOM TOOTH EXTRACTION      There were no vitals filed for this visit.  Subjective  Assessment - 04/07/18 1340    Subjective  Patient reports she had a lumbar injection last tuesday which has helped some but she continues to have some pain. She still has numbness in right foot and lower calf and pain in the posterior left thigh. 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 She is getting a lot of pain at the L anterior shoulder when she reaches (but not at rest, feels better by next day after work). She has developed pain near the right scapula since returning to work and increasing use of right UE. She feels scapular pain even at rest and at times it circles around right thorac to front of body. She reports doing her HEP less frequently due to less time (from being at work). She states "physical therapy is my lifeline" and "I would not still be standing if it was not for physical therapy." She has been back at work since 2 fridays ago. She will be up to full time hours starting next week.      Limitations  Lifting;Walking;Sitting;House hold activities    How long can you sit comfortably?  30 min    How long can you stand comfortably?  1 hour    How long can you walk comfortably?  15-20 min (limited by physical deconditioning,  not back or shoulder pain).     Patient Stated Goals  to be able to use her arm again for personal care, return to all ADL's with minimal difficutly    Currently in Pain?  Yes    Pain Score  4     Pain Location  Buttocks    Pain Orientation  Left    Pain Descriptors / Indicators  Sharp;Shooting    Pain Type  Chronic pain    Pain Radiating Towards  pain left LE to back of knee, with numbness in foot.     Pain Onset  More than a month ago    Pain Frequency  Constant    Aggravating Factors   sitting, walking too long, standing too long, bending, lifting    Pain Relieving Factors  laying down (unloading, usually R side)    Effect of Pain on Daily Activities  limits work and usual daily activities  due to difficulty due to pain.     Multiple Pain Sites  Yes    Pain Score  4    Pain Location  Shoulder    Pain Orientation  Left;Anterior;Proximal    Pain Descriptors / Indicators  Tightness    Pain Type  Surgical pain    Pain Radiating Towards  along anterior upper arm.    Pain Onset  More than a month ago    Pain Frequency  Intermittent    Aggravating Factors   reaching forward, up, across; lifting and pouring gallon pitcher.     Pain Relieving Factors  TENS unit, heat, ice    Effect of Pain on Daily Activities  limites repetative reaching at work, lifting mil    Pain Score  4    Pain Location  Scapula    Pain Orientation  Right    Pain Descriptors / Indicators  Sharp    Pain Type  Acute pain    Pain Radiating Towards  radiates along right rib cage to front of chest    Pain Onset  1 to 4 weeks ago    Aggravating Factors   reaching, rolling shoulders back.     Pain Relieving Factors  unsure, being still    Effect of Pain on Daily Activities  makes it difficult to work.         PT Education - 04/07/18 1826    Education provided  Yes    Education Details  Updated HEP, acceptable pain levels and meaning of pain, POC    Person(s) Educated  Patient    Methods  Explanation;Demonstration;Handout    Comprehension  Verbalized understanding;Returned demonstration       OBJECTIVE:  OBJECTIVE: OBSERVATION/INSPECTION: Patient presents with morbid obesity, apparent increased thoracic kyphosis and lumbar lordosis as able to be visualized.   NEUROLOGICAL: Dermatomes: BLE diminished to light touch on left at L2,L5-S2. Myotomes: BLE WFL.   SPINE MOTION Lumbar Spine AROM:  - Flexion: = 25% pain limiting down left leg.  - Extension: = 50%. - Rotation: R= 75% sciatic pain, L = 100% thoracic /rib pain.. - Side Flexion: R= patellar region, L = just proximal to patella, stiffness no pain.  PERIPHERAL JOINT MOTION (AROM/PROM in degrees): Shoulder  - Flexion: R = 150/, L =  95/130. - Extension: R = 53/, L = 30/. - Abduction: R = 144/, L = 100/. - External rotation: R = 90/, L = 70/ pain ant shoulder - Internal rotation: R = T11, L = top of iliac crest. -  STRENGTH: Shoulder (pain with most movements on left) - Flexion: R = 4/5, L = 4/5.  - Abduction: R = 4/5, L = 3/5. - External rotation: R = 4/5, L = 3/5. - Internal rotation: R = 4/5, L = 4/5. Elbow - Flexion: R = 5/5, L = 5/5. - Extension: R = 4+/5, L = 4/5. - Grip strength: WFL ,right > left Hip  - Flexion: R = 4/5, L = 4-/5. - Abduction (seated): R = 5/5, L = 5/5. -  Knee - Ext: R = 5/5, L = 4/5. - Flex: R = 5/5, L = 4/5. Ankle (sed position) - Dorsiflexion: R = 5/5, L = 4+/5. - Plantarflexion: R = 4/5, L = 4/5. - Eversion: R = 4/5, L = 4/5. - GR toe flex: R = 5/5, L = 5/5. -   MDT REPEATED MOTIONS TESTING  TEST MOVEMENTS (describe effects on present pain - During: produces, abolishes, increases, decreases, no effect, centralizing, peripheralizing. After: better, worse, no better, no worse, no effect, centralized, peripheralized) - Repeated lumbar extension in standing over counter, 3x10: all sets: during = decreased/centralizing; after = better, centralized to mid/proximal thigh. (pain fell from 5/10 to 3/10).  - repeated thoracic extension in seated with lumbar roll and hands crossed over chest, (to address R scapular/rib pain)  2x10. During = no effect, after slightly better with spinal rotation and right arm motion.    TREATMENT  Therapeutic exercise: Patient completed exercises with verbal/tactile cues from PT for proper technique/form. Improved technique/form with repetition. - Repeated lumbar extension in standing over counter, 3x10: all sets: during = decreased/centralizing; after = better, centralized to mid/proximal thigh. (pain fell from 5/10 to 3/10).  - repeated thoracic extension in seated with lumbar roll and hands crossed over chest, (to address R scapular/rib pain)  2x10.  During = no effect, after slightly better with spinal rotation and right arm motion.  - supine scapular elevation/depression x 5 to improve awareness of scapular depression.  - trial of improved seated posture with lumbar roll and education on how to use a paper towel roll at home.  - supine left serratus punch with scapular depression x 10. Painful at anterior shoulder.  - supine AAROM with PVC dowel flexion to end range with 5 second hold, x 10. ERP but no worse following.  - supine chest press to serratus press with scapular depression with dowel in both hands x 10. ERP but no worse following.  -Examination to assess progress.  -Education on diagnosis, prognosis, POC, anatomy and physiology of current condition.  -Education on updated HEP including handout   HOME EXERCISE PROGRAM Access Code: [email or web address]  URL: [email or web address]  Date: 04/07/2018  Prepared by: Rosita Kea   Exercises  Standing Lumbar Extension with Counter - 10-15 reps - 1 sets - 1 second hold - 4-8x daily  Seated Thoracic Lumbar Extension - 10-15 reps - 1 second hold - 1 Sets - 4-8x daily - 7x weekly  Seated Correct Posture  Supine Shoulder Flexion with Dowel - 10-15 reps - 5 second hold - 1 Sets - 1x daily - 3x weekly  Supine Shoulder Press with Dowel - 10-15 reps - 1 second hold - 1 Sets - 1x daily - 3x weekly  Supine Shoulder Protraction with Dowel - 10-15 reps - 1 second hold - 1 Sets - 1x daily - 3x weekly     Patient response to treatment:  Pt tolerated treatment well. Pt was able to complete all exercises with no  lasting increase in pain beyond slight discomfort in left shoulder. Educated patient on acceptable levels of pain and how to advance HEP as appropriate before next visit in 2 weeks, and how to contact PT if having difficulty. Pt required cuing for proper technique and to facilitate improved neuromuscular control, strength, range of motion, and functional ability. Patient demonstrated  clear centralizing and reduced pain to lumbar extension exercises and possible reduction in symptoms with repeated thoracic extension.       PT Long Term Goals - 04/07/18 1339      PT LONG TERM GOAL #1   Title  Patient will improve FOTO score to 25/100 demonstrating improvement with functional use left UE for daily tasks    Baseline  FOTO 4/100; 09/04/17 4/100 no change due to severity of injury/surgery and continued restrictions on use/movement; 11/10/17 26/100    Status  Achieved    Target Date  12/23/17      PT LONG TERM GOAL #2   Title  Patient will improve FOTO score to 50/100 demonstrating improvement with functional use left UE for daily tasks    Baseline  FOTO 4/100; 12/10/2017 38/100 01/26/18 43/100; 04/07/2018 45/100    Time  6    Status  Partially Met    Target Date  05/19/18      PT LONG TERM GOAL #3   Title  patient will be independent with home exercises for flexibility and strength to allow transition to self management once discharged from physical therapy    Baseline  has not yet received final HEP, reports reduction in HEP completion since starting back to work    Time  6    Period  Weeks    Status  Partially Met    Target Date  05/19/18      PT LONG TERM GOAL #4   Title  Patient will demonstrate lumbar range of motion to [facility] to improve ability to perform ADLs such as reaching, dressing, sitting, drivig, etc.     Time  6    Period  Weeks    Status  On-going    Target Date  05/19/18      PT LONG TERM GOAL #5   Title  Patient will demonstrate LE strength of at least 4+/5 to increase ease with performing functional activities such as transfers, stair navigation, walking, etc.    Baseline  varies between 4 to 5/10 BLE, left slightly weaker than right.     Time  6    Period  Weeks    Status  On-going    Target Date  05/19/18            Plan - 04/07/18 1833    Clinical Impression Statement  Patient has attended 45 visits and has 1 more visit currently  authorized by insurance company. Patient has not attended PT frequently in the last 30 days and has made minimal progress towards her goals in the last certification period. She has returned to part time work and plans to return to full time but continues to report limitations due to pain, reduced ROM, and strength deficits. She continues to demonstrate significant impairments in shoulder ROM, pain, function, and strength and has not yet achieved her goals. She continues to have decreased function from low back pain with radicular symptoms down her leg that responded well today to repeated motions treatment. Patient would benefit from continued PT interventions to improve her activity tolerance and function for valued activities. Recommend PT continue 2x  a week for 6 more weeks to address remaining impairments and functional deficits.     Clinical Impairments Affecting Rehab Potential  (+) motivated, acute condition (-)obesity, DDD, lumbar, chronicity of symptoms    PT Frequency  2x / week    PT Duration  6 weeks    PT Treatment/Interventions  Manual techniques;Neuromuscular re-education;Patient/family education;Cryotherapy;Electrical Stimulation;Moist Heat;Therapeutic exercise;Therapeutic activities;Balance training;Scar mobilization;Passive range of motion;Iontophoresis '4mg'$ /ml Dexamethasone;Gait training;Stair training;Dry needling;Ultrasound;Traction;Functional mobility training    PT Next Visit Plan  low back pain management, UE strengthening/stretching. teach and reinforce self-managment techniques and graded exercises to improve activity tolerance.     PT Home Exercise Plan  [facility] Access Code: [email or web address]     Consulted and Agree with Plan of Care  Patient      ASSESSMENT:  Patient has attended 45 visits and has 1 more visit currently authorized by insurance company. Patient has not attended PT frequently in the last 30 days and has made minimal progress towards her goals in the last certification  period. She has returned to part time work and plans to return to full time but continues to report limitations due to pain, reduced ROM, and strength deficits. She continues to demonstrate significant impairments in shoulder ROM, pain, function, and strength and has not yet achieved her goals. She continues to have decreased function from low back pain with radicular symptoms down her leg that responded well today to repeated motions treatment. Patient would benefit from continued PT interventions to improve her activity tolerance and function for valued activities. Recommend PT continue 2x a week for 6 more weeks to address remaining impairments and functional deficits.   Patient will benefit from skilled therapeutic intervention in order to improve the following deficits and impairments:  Pain, Impaired UE functional use, Impaired perceived functional ability, Decreased strength, Decreased range of motion, Decreased endurance, Decreased activity tolerance, Decreased balance, Obesity  Visit Diagnosis: Stiffness of left shoulder, not elsewhere classified  Acute pain of left shoulder  Chronic left-sided low back pain with left-sided sciatica  Muscle weakness (generalized)  Other muscle spasm     Problem List Patient Active Problem List   Diagnosis Date Noted  . DDD (degenerative disc disease), thoracolumbar 01/06/2018  . DVT (deep venous thrombosis) ([place]) 08/29/2017  . Leukocytosis 08/06/2017  . Humerus fracture 08/01/2017  . CPAP (continuous positive airway pressure) dependence 08/01/2017  . Asthma 08/01/2017  . HTN (hypertension) 06/24/2017  . Depression 11/07/2015  . Systolic murmur 24/93/2419  . Morbid obesity ([place]) 10/26/2014  . Insomnia 06/21/2014  . Anxiety 06/21/2014  . Spinal stenosis at L4-L5 level 06/02/2014  . POLYCYSTIC OVARIAN DISEASE 06/19/2010    Nancy Nordmann , PT, DPT 04/07/2018, 6:41 PM  [facility] [address], [place], [address] Phone: [phone]   Fax:  [phone]  Name: Danielle Irwin MRN: [id number] Date of Birth: 28-Aug-1970

## 2018-04-09 ENCOUNTER — Other Ambulatory Visit: Payer: Self-pay | Admitting: Family Medicine

## 2018-04-09 DIAGNOSIS — J209 Acute bronchitis, unspecified: Secondary | ICD-10-CM

## 2018-04-17 ENCOUNTER — Telehealth (INDEPENDENT_AMBULATORY_CARE_PROVIDER_SITE_OTHER): Payer: Self-pay | Admitting: Physical Medicine and Rehabilitation

## 2018-04-17 ENCOUNTER — Encounter (INDEPENDENT_AMBULATORY_CARE_PROVIDER_SITE_OTHER): Payer: Self-pay | Admitting: Physical Medicine and Rehabilitation

## 2018-04-17 NOTE — Telephone Encounter (Signed)
Fax clinical notes to pt insurance company. Case is pending.

## 2018-04-21 ENCOUNTER — Ambulatory Visit: Payer: BLUE CROSS/BLUE SHIELD | Admitting: Physical Therapy

## 2018-04-22 NOTE — Telephone Encounter (Signed)
Case is still pending

## 2018-04-23 ENCOUNTER — Encounter: Payer: BLUE CROSS/BLUE SHIELD | Admitting: Physical Therapy

## 2018-04-28 ENCOUNTER — Encounter: Payer: BLUE CROSS/BLUE SHIELD | Admitting: Physical Therapy

## 2018-05-01 NOTE — Telephone Encounter (Signed)
Called pt and lvm #1.   [employer] has approved procedure code [id number]

## 2018-05-12 ENCOUNTER — Ambulatory Visit: Payer: BLUE CROSS/BLUE SHIELD | Admitting: Physical Therapy

## 2018-05-12 ENCOUNTER — Ambulatory Visit (INDEPENDENT_AMBULATORY_CARE_PROVIDER_SITE_OTHER): Payer: BLUE CROSS/BLUE SHIELD | Admitting: Family Medicine

## 2018-05-12 ENCOUNTER — Encounter: Payer: Self-pay | Admitting: Family Medicine

## 2018-05-12 VITALS — BP 118/64 | HR 86 | Temp 98.3°F | Ht 66.5 in | Wt 378.0 lb

## 2018-05-12 DIAGNOSIS — R609 Edema, unspecified: Secondary | ICD-10-CM | POA: Diagnosis not present

## 2018-05-12 DIAGNOSIS — M797 Fibromyalgia: Secondary | ICD-10-CM

## 2018-05-12 DIAGNOSIS — F329 Major depressive disorder, single episode, unspecified: Secondary | ICD-10-CM | POA: Diagnosis not present

## 2018-05-12 DIAGNOSIS — Z6841 Body Mass Index (BMI) 40.0 and over, adult: Secondary | ICD-10-CM | POA: Insufficient documentation

## 2018-05-12 DIAGNOSIS — F32A Depression, unspecified: Secondary | ICD-10-CM

## 2018-05-12 LAB — BRAIN NATRIURETIC PEPTIDE: Pro B Natriuretic peptide (BNP): 36 pg/mL (ref 0.0–100.0)

## 2018-05-12 LAB — COMPREHENSIVE METABOLIC PANEL
ALBUMIN: 4.1 g/dL (ref 3.5–5.2)
ALK PHOS: 54 U/L (ref 39–117)
ALT: 52 U/L — ABNORMAL HIGH (ref 0–35)
AST: 38 U/L — ABNORMAL HIGH (ref 0–37)
BUN: 17 mg/dL (ref 6–23)
CO2: 26 mEq/L (ref 19–32)
CREATININE: 0.67 mg/dL (ref 0.40–1.20)
Calcium: 9.1 mg/dL (ref 8.4–10.5)
Chloride: 106 mEq/L (ref 96–112)
GFR: 99.94 mL/min (ref >60.00)
GLUCOSE: 142 mg/dL — AB (ref 70–99)
POTASSIUM: 4 meq/L (ref 3.5–5.1)
SODIUM: 139 meq/L (ref 135–145)
TOTAL PROTEIN: 7 g/dL (ref 6.0–8.3)
Total Bilirubin: 1 mg/dL (ref 0.2–1.2)

## 2018-05-12 LAB — VITAMIN D 25 HYDROXY (VIT D DEFICIENCY, FRACTURES): VITD: 26.79 ng/mL — ABNORMAL LOW (ref 30.00–100.00)

## 2018-05-12 MED ORDER — SERTRALINE HCL 100 MG PO TABS: 150.0000 mg | ORAL_TABLET | Freq: Every day | ORAL | 3 refills | Status: DC

## 2018-05-12 MED ORDER — PREGABALIN 75 MG PO CAPS: 75.0000 mg | ORAL_CAPSULE | Freq: Two times a day (BID) | ORAL | 1 refills | Status: DC

## 2018-05-12 NOTE — Assessment & Plan Note (Signed)
Dependent, likely due to her weight and now being on her feet for more hours during the day.  Advised compression,elevation, DASH diet, labs today. The patient indicates understanding of these issues and agrees with the plan. Orders Placed This Encounter  Procedures  . Comprehensive metabolic panel  . B Nat Peptide  . Vitamin D (25 hydroxy)

## 2018-05-12 NOTE — Progress Notes (Signed)
Subjective:   Patient ID: Danielle Irwin, female    DOB: 18-Jun-1970, 47 y.o.   MRN: [id number]  Danielle Irwin is a pleasant 47 y.o. year old female who presents to clinic today with Muscle Pain (Patient is here today C/O multiple myalgias & arthralgias.  She feels that this is causing her to have problems with depression.  Her last Vit-D was drawn 3-years-ago and was at low end of threshold at 36.5.  See PHQ-9.  )  on 05/12/2018  HPI:   Depression- currently on zoloft 100 mg daily.  She has had so much pain, broke her arm this fall, worsening her fibromyalgia which is worsening her depression.  She does feel that zoloft was helpful until recently, as her pain increased.  Depression screen [facility] 2/9 05/12/2018 06/24/2017  Decreased Interest 3 2  Down, Depressed, Hopeless 3 2  PHQ - 2 Score 6 4  Altered sleeping 3 2  Tired, decreased energy 3 3  Change in appetite 3 1  Feeling bad or failure about yourself  3 3  Trouble concentrating 3 0  Moving slowly or fidgety/restless 2 1  Suicidal thoughts 0 0  PHQ-9 Score 23 14  Difficult doing work/chores Very difficult Extremely dIfficult   GAD 7 : Generalized Anxiety Score 05/12/2018  Nervous, Anxious, on Edge 3  Control/stop worrying 3  Worry too much - different things 3  Trouble relaxing 3  Restless 3  Easily annoyed or irritable 3  Afraid - awful might happen 3  Total GAD 7 Score 21  Anxiety Difficulty Very difficult    Denies SI or HI.  Her weight has unfortunately gone up as well- other than work, she just wants to stay in bed. Wt Readings from Last 3 Encounters:  05/12/18 (!) 378 lb (171.5 kg)  02/24/18 (!) 370 lb (167.8 kg)  02/04/18 (!) 370 lb (167.8 kg)   Has had more LE edema at the end of the day.  Current Outpatient Medications on File Prior to Visit  Medication Sig Dispense Refill  . Alum & Mag Hydroxide-Simeth (ANTACID ANTI-GAS PO) Take 1 tablet by mouth daily as needed (for gas).    . Ascorbic Acid (VITAMIN C)  1000 MG tablet Take 1,000 mg by mouth 2 (two) times daily.    Marland Kitchen aspirin EC 81 MG tablet Take 81 mg by mouth daily.    Marland Kitchen aspirin-acetaminophen-caffeine (EXCEDRIN MIGRAINE) 250-250-65 MG tablet Take 2 tablets by mouth 2 (two) times daily as needed for headache or migraine.    Marland Kitchen b complex vitamins tablet Take 1 tablet by mouth daily.    . Calcium Carbonate-Vitamin D (CALCIUM-D PO) Take 1,200 mg by mouth 2 (two) times daily.    . Cholecalciferol (VITAMIN D-3) 5000 units TABS Take 5,000 Units by mouth 2 (two) times daily.    . diphenhydrAMINE (BENADRYL) 25 MG tablet Take 25-50 mg by mouth every 6 (six) hours as needed for itching, allergies or sleep.    Marland Kitchen docusate sodium (COLACE) 100 MG capsule Take 1 capsule (100 mg total) by mouth 2 (two) times daily as needed for mild constipation. 20 capsule 1  . ECHINACEA-GOLDEN SEAL PO Take 1 tablet by mouth 2 (two) times daily as needed (immune system boost).     . hydrochlorothiazide (HYDRODIURIL) 25 MG tablet Take 1 tablet (25 mg total) by mouth daily. (Patient taking differently: Take 25-50 mg by mouth daily. ) 30 tablet 11  . lisinopril (PRINIVIL,ZESTRIL) 10 MG tablet Take 1 tablet (10  mg total) by mouth daily. (Patient taking differently: Take 9 mg by mouth at bedtime. ) 90 tablet 3  . Magnesium 500 MG TABS Take 500 mg by mouth at bedtime.    . Melatonin 10 MG TABS Take 1 tablet by mouth at bedtime.     . Multiple Vitamin (MULTIVITAMIN WITH MINERALS) TABS tablet Take 1 tablet by mouth daily.    . Multiple Vitamins-Minerals (ZINC PO) Take 500 mg by mouth daily as needed (immune system boost).    . mupirocin ointment (BACTROBAN) 2 % Apply 1 application topically 2 (two) times daily. To affected area 30 g 1  . polyethylene glycol (MIRALAX / GLYCOLAX) packet Take 17 g by mouth daily as needed for moderate constipation or severe constipation. 14 each 0  . senna-docusate (SENOKOT-S) 8.6-50 MG tablet Take 1 tablet by mouth at bedtime as needed for mild constipation.  15 tablet 0  . silver sulfADIAZINE (SILVADENE) 1 % cream Apply 1 application topically 2 (two) times daily. (Patient taking differently: Apply 1 application topically 2 (two) times daily as needed (wound care). ) 50 g 0  . St Johns Wort 300 MG CAPS Take 900 mg by mouth 2 (two) times daily.     . VENTOLIN HFA 108 (90 Base) MCG/ACT inhaler INHALE 2 PUFFS BY MOUTH EVERY 6 HOURS AS NEEDED 18 each 1   No current facility-administered medications on file prior to visit.     Allergies  Allergen Reactions  . Benzalkonium Chloride Anaphylaxis and Hives  . Neosporin [Neomycin-Bacitracin Zn-Polymyx] Anaphylaxis and Hives  . Polysporin [Bacitracin-Polymyxin B] Hives  . Bee Venom Hives and Swelling    Throat swelling  . Bupropion Hcl Other (See Comments)    homicidal  . Cephalexin Hives and Swelling  . Latex     Past Medical History:  Diagnosis Date  . Asthma   . Depression   . Family history of adverse reaction to anesthesia    FATHER WAS SLOW TO WAKE UP  . Headache    OCCASIONAL MIGRAINES  . HNP (herniated nucleus pulposus), lumbar   . Hx of epistaxis   . Insomnia   . OA (osteoarthritis)   . Obesity   . PCOS (polycystic ovarian syndrome)   . Sensitive skin   . Vitamin D deficiency     Past Surgical History:  Procedure Laterality Date  . LUMBAR LAMINECTOMY/DECOMPRESSION MICRODISCECTOMY Right 06/02/2014   Procedure: MICRO LUMBAR DECOMPRESSION L4 - L5 ON THE RIGHT 1 LEVEL;  Surgeon: Johnn Hai, MD;  Location: [facility];  Service: Orthopedics;  Laterality: Right;  . ORIF HUMERUS FRACTURE Left 08/01/2017   Procedure: OPEN REDUCTION INTERNAL FIXATION (ORIF) PROXIMAL HUMERUS FRACTURE;  Surgeon: Shona Needles, MD;  Location: [place];  Service: Orthopedics;  Laterality: Left;  . WISDOM TOOTH EXTRACTION      Family History  Problem Relation Age of Onset  . Heart attack Father 19  . Breast cancer Maternal Aunt 59    Social History   Socioeconomic History  . Marital status: Single      Spouse name: Not on file  . Number of children: Not on file  . Years of education: Not on file  . Highest education level: Not on file  Occupational History  . Not on file  Social Needs  . Financial resource strain: Not on file  . Food insecurity:    Worry: Not on file    Inability: Not on file  . Transportation needs:    Medical: Not on  file    Non-medical: Not on file  Tobacco Use  . Smoking status: Former Smoker    Last attempt to quit: 05/26/1990    Years since quitting: 27.9  . Smokeless tobacco: Never Used  Substance and Sexual Activity  . Alcohol use: No    Alcohol/week: 0.0 standard drinks  . Drug use: No  . Sexual activity: Not on file  Lifestyle  . Physical activity:    Days per week: Not on file    Minutes per session: Not on file  . Stress: Not on file  Relationships  . Social connections:    Talks on phone: Not on file    Gets together: Not on file    Attends religious service: Not on file    Active member of club or organization: Not on file    Attends meetings of clubs or organizations: Not on file    Relationship status: Not on file  . Intimate partner violence:    Fear of current or ex partner: Not on file    Emotionally abused: Not on file    Physically abused: Not on file    Forced sexual activity: Not on file  Other Topics Concern  . Not on file  Social History Narrative  . Not on file   The PMH, PSH, Social History, Family History, Medications, and allergies have been reviewed in [facility], and have been updated if relevant.   Review of Systems  Musculoskeletal: Positive for arthralgias and myalgias. Negative for back pain, gait problem, joint swelling, neck pain and neck stiffness.  Psychiatric/Behavioral: Positive for dysphoric mood and sleep disturbance. Negative for agitation, behavioral problems, confusion, decreased concentration, hallucinations, self-injury and suicidal ideas. The patient is nervous/anxious. The patient is not hyperactive.    All other systems reviewed and are negative.      Objective:    BP 118/64 (BP Location: Right Arm, Patient Position: Sitting, Cuff Size: Normal)   Pulse 86   Temp 98.3 F (36.8 C) (Oral)   Ht 5' 6.5" (1.689 m)   Wt (!) 378 lb (171.5 kg)   LMP 07/11/2016   SpO2 96%   BMI 60.10 kg/m    Physical Exam  Constitutional: She is oriented to person, place, and time. She appears well-developed and well-nourished. No distress.  HENT:  Head: Normocephalic and atraumatic.  Eyes: EOM are normal.  Neck: Normal range of motion.  Cardiovascular: Normal rate.  Pulmonary/Chest: Effort normal.  Neurological: She is alert and oriented to person, place, and time. No cranial nerve deficit.  Skin: Skin is warm and dry. She is not diaphoretic.  Psychiatric: She has a normal mood and affect. Her behavior is normal. Judgment and thought content normal.  Nursing note and vitals reviewed.         Assessment & Plan:   Depression, unspecified depression type - Plan: Vitamin D (25 hydroxy)  Fibromyalgia - Plan: Vitamin D (25 hydroxy)  Edema, unspecified type - Plan: Comprehensive metabolic panel, B Nat Peptide Return in about 2 weeks (around 05/26/2018).

## 2018-05-12 NOTE — Assessment & Plan Note (Signed)
Deteriorated.  See above.

## 2018-05-12 NOTE — Assessment & Plan Note (Signed)
Start Lyrica 75 mg twice daily. Discussed increasing physical activity. Follow up in 2-3 weeks. The patient indicates understanding of these issues and agrees with the plan.

## 2018-05-12 NOTE — Assessment & Plan Note (Signed)
>  40 minutes spent in face to face time with patient, >50% spent in counselling or coordination of care discussing depression, fibromyalgia, her weight. We agreed to increase dose of zoloft to 150 mg daily. Add Lyrica.Does not want psychotherapy referral at this time. See below.

## 2018-05-12 NOTE — Telephone Encounter (Signed)
Pt is scheduled 06/09/18 with driver.

## 2018-05-12 NOTE — Patient Instructions (Signed)
Great to see you. We are starting Lyrica 75 mg twice daily along with increasing your zoloft to 150 mg daily.  Checking blood as work as well- Please follow up with me in 2-3 weeks, sooner if you have any concerns.

## 2018-05-19 ENCOUNTER — Encounter: Payer: BLUE CROSS/BLUE SHIELD | Admitting: Physical Therapy

## 2018-05-20 ENCOUNTER — Encounter: Payer: Self-pay | Admitting: Family Medicine

## 2018-05-21 ENCOUNTER — Ambulatory Visit (INDEPENDENT_AMBULATORY_CARE_PROVIDER_SITE_OTHER): Payer: BLUE CROSS/BLUE SHIELD | Admitting: Family Medicine

## 2018-05-21 ENCOUNTER — Encounter: Payer: Self-pay | Admitting: Family Medicine

## 2018-05-21 VITALS — BP 140/90 | HR 104 | Temp 98.6°F | Ht 66.5 in | Wt 371.6 lb

## 2018-05-21 DIAGNOSIS — R509 Fever, unspecified: Secondary | ICD-10-CM | POA: Diagnosis not present

## 2018-05-21 DIAGNOSIS — J40 Bronchitis, not specified as acute or chronic: Secondary | ICD-10-CM

## 2018-05-21 LAB — POCT INFLUENZA A/B
INFLUENZA B, POC: NEGATIVE
Influenza A, POC: NEGATIVE

## 2018-05-21 MED ORDER — CLARITHROMYCIN 500 MG PO TABS: 500.0000 mg | ORAL_TABLET | Freq: Two times a day (BID) | ORAL | 0 refills | Status: AC

## 2018-05-21 MED ORDER — PREDNISONE 20 MG PO TABS: ORAL_TABLET | ORAL | 0 refills | Status: DC

## 2018-05-21 MED ORDER — GUAIFENESIN-CODEINE 100-10 MG/5ML PO SYRP: 5.0000 mL | ORAL_SOLUTION | Freq: Three times a day (TID) | ORAL | 0 refills | Status: DC | PRN

## 2018-05-21 MED ORDER — BENZONATATE 100 MG PO CAPS: 100.0000 mg | ORAL_CAPSULE | Freq: Three times a day (TID) | ORAL | 0 refills | Status: DC | PRN

## 2018-05-21 NOTE — Progress Notes (Signed)
Danielle Irwin is a 47 y.o. female  Chief Complaint  Patient presents with  . Cough    congestion, possible bronhitis, fever o Tuesday 101.2    HPI: Danielle Irwin is a 47 y.o. female complains of 3 day h/o cough that is rarely productive, fever (Tmax 101.7 2 days ago), runny nose, nasal congestion, headache. Pt endorses SOB at rest and with exertion.  No ear pain. No chills. No sore throat. No GI symptoms. Pt has tried delsym, mucinex, tylenol/ibuprofen. Last dose around 8:30am today. She used her inhaler yesterday with some relief.  No known sick contacts but pt works at [employer].  Pt states she has had bronchitis multiple times in the past, always "requiring an antibiotic and sometimes two".   Past Medical History:  Diagnosis Date  . Asthma   . Depression   . Family history of adverse reaction to anesthesia    FATHER WAS SLOW TO WAKE UP  . Headache    OCCASIONAL MIGRAINES  . HNP (herniated nucleus pulposus), lumbar   . Hx of epistaxis   . Insomnia   . OA (osteoarthritis)   . Obesity   . PCOS (polycystic ovarian syndrome)   . Sensitive skin   . Vitamin D deficiency     Past Surgical History:  Procedure Laterality Date  . LUMBAR LAMINECTOMY/DECOMPRESSION MICRODISCECTOMY Right 06/02/2014   Procedure: MICRO LUMBAR DECOMPRESSION L4 - L5 ON THE RIGHT 1 LEVEL;  Surgeon: Johnn Hai, MD;  Location: [facility];  Service: Orthopedics;  Laterality: Right;  . ORIF HUMERUS FRACTURE Left 08/01/2017   Procedure: OPEN REDUCTION INTERNAL FIXATION (ORIF) PROXIMAL HUMERUS FRACTURE;  Surgeon: Shona Needles, MD;  Location: [place];  Service: Orthopedics;  Laterality: Left;  . WISDOM TOOTH EXTRACTION      Social History   Socioeconomic History  . Marital status: Single    Spouse name: Not on file  . Number of children: Not on file  . Years of education: Not on file  . Highest education level: Not on file  Occupational History  . Not on file  Social Needs  . Financial resource strain:  Not on file  . Food insecurity:    Worry: Not on file    Inability: Not on file  . Transportation needs:    Medical: Not on file    Non-medical: Not on file  Tobacco Use  . Smoking status: Former Smoker    Last attempt to quit: 05/26/1990    Years since quitting: 28.0  . Smokeless tobacco: Never Used  Substance and Sexual Activity  . Alcohol use: No    Alcohol/week: 0.0 standard drinks  . Drug use: No  . Sexual activity: Not on file  Lifestyle  . Physical activity:    Days per week: Not on file    Minutes per session: Not on file  . Stress: Not on file  Relationships  . Social connections:    Talks on phone: Not on file    Gets together: Not on file    Attends religious service: Not on file    Active member of club or organization: Not on file    Attends meetings of clubs or organizations: Not on file    Relationship status: Not on file  . Intimate partner violence:    Fear of current or ex partner: Not on file    Emotionally abused: Not on file    Physically abused: Not on file    Forced sexual activity: Not  on file  Other Topics Concern  . Not on file  Social History Narrative  . Not on file    Family History  Problem Relation Age of Onset  . Heart attack Father 48  . Breast cancer Maternal Aunt 67     Immunization History  Administered Date(s) Administered  . Tdap 06/24/2017    Outpatient Encounter Medications as of 05/21/2018  Medication Sig  . Alum & Mag Hydroxide-Simeth (ANTACID ANTI-GAS PO) Take 1 tablet by mouth daily as needed (for gas).  . Ascorbic Acid (VITAMIN C) 1000 MG tablet Take 1,000 mg by mouth 2 (two) times daily.  Marland Kitchen aspirin EC 81 MG tablet Take 81 mg by mouth daily.  Marland Kitchen aspirin-acetaminophen-caffeine (EXCEDRIN MIGRAINE) 250-250-65 MG tablet Take 2 tablets by mouth 2 (two) times daily as needed for headache or migraine.  Marland Kitchen b complex vitamins tablet Take 1 tablet by mouth daily.  . Calcium Carbonate-Vitamin D (CALCIUM-D PO) Take 1,200 mg by  mouth 2 (two) times daily.  . Cholecalciferol (VITAMIN D-3) 5000 units TABS Take 5,000 Units by mouth 2 (two) times daily.  . diphenhydrAMINE (BENADRYL) 25 MG tablet Take 25-50 mg by mouth every 6 (six) hours as needed for itching, allergies or sleep.  Marland Kitchen docusate sodium (COLACE) 100 MG capsule Take 1 capsule (100 mg total) by mouth 2 (two) times daily as needed for mild constipation.  Marland Kitchen ECHINACEA-GOLDEN SEAL PO Take 1 tablet by mouth 2 (two) times daily as needed (immune system boost).   . hydrochlorothiazide (HYDRODIURIL) 25 MG tablet Take 1 tablet (25 mg total) by mouth daily. (Patient taking differently: Take 25-50 mg by mouth daily. )  . lisinopril (PRINIVIL,ZESTRIL) 10 MG tablet Take 1 tablet (10 mg total) by mouth daily. (Patient taking differently: Take 9 mg by mouth at bedtime. )  . Magnesium 500 MG TABS Take 500 mg by mouth at bedtime.  . Melatonin 10 MG TABS Take 1 tablet by mouth at bedtime.   . Multiple Vitamin (MULTIVITAMIN WITH MINERALS) TABS tablet Take 1 tablet by mouth daily.  . Multiple Vitamins-Minerals (ZINC PO) Take 500 mg by mouth daily as needed (immune system boost).  . mupirocin ointment (BACTROBAN) 2 % Apply 1 application topically 2 (two) times daily. To affected area  . polyethylene glycol (MIRALAX / GLYCOLAX) packet Take 17 g by mouth daily as needed for moderate constipation or severe constipation.  . pregabalin (LYRICA) 75 MG capsule Take 1 capsule (75 mg total) by mouth 2 (two) times daily.  Marland Kitchen senna-docusate (SENOKOT-S) 8.6-50 MG tablet Take 1 tablet by mouth at bedtime as needed for mild constipation.  . sertraline (ZOLOFT) 100 MG tablet Take 1.5 tablets (150 mg total) by mouth daily.  . silver sulfADIAZINE (SILVADENE) 1 % cream Apply 1 application topically 2 (two) times daily. (Patient taking differently: Apply 1 application topically 2 (two) times daily as needed (wound care). )  . St Johns Wort 300 MG CAPS Take 900 mg by mouth 2 (two) times daily.   . VENTOLIN  HFA 108 (90 Base) MCG/ACT inhaler INHALE 2 PUFFS BY MOUTH EVERY 6 HOURS AS NEEDED  . benzonatate (TESSALON) 100 MG capsule Take 1 capsule (100 mg total) by mouth 3 (three) times daily as needed for cough.  . clarithromycin (BIAXIN) 500 MG tablet Take 1 tablet (500 mg total) by mouth 2 (two) times daily for 10 days.  Marland Kitchen guaiFENesin-codeine (CHERATUSSIN AC) 100-10 MG/5ML syrup Take 5 mLs by mouth 3 (three) times daily as needed for cough.  Marland Kitchen  predniSONE (DELTASONE) 20 MG tablet 3 tabs po x 3 days, 2 tabs po x 3 days, 1 tab po x 3 days, 1/2 tab po x 3 days   No facility-administered encounter medications on file as of 05/21/2018.      ROS: Pertinent positives and negatives noted above in HPI. Remainder of ROS negative/non-contributory   Allergies  Allergen Reactions  . Benzalkonium Chloride Anaphylaxis and Hives  . Neosporin [Neomycin-Bacitracin Zn-Polymyx] Anaphylaxis and Hives  . Polysporin [Bacitracin-Polymyxin B] Hives  . Bee Venom Hives and Swelling    Throat swelling  . Bupropion Hcl Other (See Comments)    angry  . Cephalexin Hives and Swelling  . Latex     BP 140/90 (BP Location: Right Arm, Patient Position: Sitting, Cuff Size: Normal)   Pulse (!) 104   Temp 98.6 F (37 C) (Oral)   Ht 5' 6.5" (1.689 m)   Wt (!) 371 lb 9.6 oz (168.6 kg)   SpO2 94%   BMI 59.08 kg/m   Physical Exam  Constitutional: She is oriented to person, place, and time. She appears well-developed and well-nourished. No distress.  HENT:  Head: Normocephalic and atraumatic.  Right Ear: Tympanic membrane and ear canal normal.  Left Ear: Tympanic membrane and ear canal normal.  Nose: Rhinorrhea present. Right sinus exhibits no maxillary sinus tenderness and no frontal sinus tenderness. Left sinus exhibits no maxillary sinus tenderness and no frontal sinus tenderness.  Mouth/Throat: Oropharynx is clear and moist and mucous membranes are normal. No oropharyngeal exudate.  Eyes: Pupils are equal, round, and  reactive to light. Conjunctivae and EOM are normal. Right eye exhibits no discharge. Left eye exhibits no discharge.  Neck: Neck supple.  Cardiovascular: Normal rate, regular rhythm and normal heart sounds.  Pulmonary/Chest: Effort normal. No stridor. No respiratory distress. She has wheezes (B/L expiratory wheeze). She has no rhonchi.  Coarse BS B/L Rt > Lt  Lymphadenopathy:    She has no cervical adenopathy.  Neurological: She is alert and oriented to person, place, and time.     A/P:  1. Fever, unspecified fever cause - POCT Influenza A/B - negative  2. Bronchitis - cont supportive care to include increased fluids, nasal saline spray, mucinex Rx: - predniSONE (DELTASONE) 20 MG tablet; 3 tabs po x 3 days, 2 tabs po x 3 days, 1 tab po x 3 days, 1/2 tab po x 3 days  Dispense: 20 tablet; Refill: 0 - benzonatate (TESSALON) 100 MG capsule; Take 1 capsule (100 mg total) by mouth 3 (three) times daily as needed for cough.  Dispense: 30 capsule; Refill: 0 - guaiFENesin-codeine (CHERATUSSIN AC) 100-10 MG/5ML syrup; Take 5 mLs by mouth 3 (three) times daily as needed for cough.  Dispense: 120 mL; Refill: 0 - clarithromycin (BIAXIN) 500 MG tablet; Take 1 tablet (500 mg total) by mouth 2 (two) times daily for 10 days.  Dispense: 20 tablet; Refill: 0 - pt was very insistent on need for abx and repeated twice that she "let bronchitis go too long and got walking pneumonia" - f/u if symptoms worsen or do not improve in 10-14 days Discussed plan and reviewed medications with patient, including risks, benefits, and potential side effects. Pt expressed understand. All questions answered.

## 2018-05-21 NOTE — Patient Instructions (Signed)
Drink plenty of fluids, especially water Rest Use nasal saline spray at least 3 times per day Use humidifier at night Take cough syrup - at night as it can make you sleepy Use cough capsules 3x/day as needed for cough Take prednisone as directed Take antibiotic as directed Follow-up if symptoms worsen or do not improve in 7-10 days

## 2018-05-26 ENCOUNTER — Ambulatory Visit (INDEPENDENT_AMBULATORY_CARE_PROVIDER_SITE_OTHER): Payer: BLUE CROSS/BLUE SHIELD | Admitting: Family Medicine

## 2018-05-26 VITALS — BP 132/86 | HR 84 | Temp 98.4°F | Ht 67.0 in | Wt 369.4 lb

## 2018-05-26 DIAGNOSIS — Z6841 Body Mass Index (BMI) 40.0 and over, adult: Secondary | ICD-10-CM | POA: Diagnosis not present

## 2018-05-26 DIAGNOSIS — F329 Major depressive disorder, single episode, unspecified: Secondary | ICD-10-CM | POA: Diagnosis not present

## 2018-05-26 DIAGNOSIS — R609 Edema, unspecified: Secondary | ICD-10-CM

## 2018-05-26 DIAGNOSIS — M797 Fibromyalgia: Secondary | ICD-10-CM | POA: Diagnosis not present

## 2018-05-26 DIAGNOSIS — F32A Depression, unspecified: Secondary | ICD-10-CM

## 2018-05-26 DIAGNOSIS — J209 Acute bronchitis, unspecified: Secondary | ICD-10-CM

## 2018-05-26 MED ORDER — HYDROCOD POLST-CPM POLST ER 10-8 MG/5ML PO SUER: 5.0000 mL | Freq: Two times a day (BID) | ORAL | 0 refills | Status: DC | PRN

## 2018-05-26 MED ORDER — SERTRALINE HCL 100 MG PO TABS: 150.0000 mg | ORAL_TABLET | Freq: Every day | ORAL | 3 refills | Status: DC

## 2018-05-26 MED ORDER — EPINEPHRINE 0.3 MG/0.3ML IJ SOAJ: 0.3000 mg | Freq: Once | INTRAMUSCULAR | 3 refills | Status: DC

## 2018-05-26 NOTE — Assessment & Plan Note (Signed)
>  40 minutes spent in face to face time with patient, >50% spent in counselling or coordination of care.  Fibromyalgia symptoms do seem to be improving with Lyrica 75 mg twice daily.  Will continue current dose for now and she will keep me updated.

## 2018-05-26 NOTE — Assessment & Plan Note (Signed)
PHQ and GAD both dramatically improved with increased does of zoloft to 150 mg daily after only two weeks. No changes made today. She will keep me updated.

## 2018-05-26 NOTE — Patient Instructions (Addendum)
Happy Holidays! It was great to see you. I have sent tussionex to your pharmacy to use for severe cough (please be aware that it will make you sleepy).  Please keep me updated.

## 2018-05-26 NOTE — Assessment & Plan Note (Signed)
Improving but cough is lingering as expected. D/c cheratussin, rx sent for tussionex which she requested so she can sleep at night which is appropriate.  Discussed sedation precautions. Call or return to clinic prn if these symptoms worsen or fail to improve as anticipated. The patient indicates understanding of these issues and agrees with the plan.

## 2018-05-26 NOTE — Addendum Note (Signed)
Addended by: Lucille Passy on: 05/26/2018 11:54 AM   Modules accepted: Orders

## 2018-05-26 NOTE — Progress Notes (Signed)
Subjective:   Patient ID: Danielle Irwin, female    DOB: [birth year], 47 y.o.   MRN: [id number]  Danielle Irwin is a pleasant 47 y.o. year old female who presents to clinic today with Follow-up (Patient is here today for a 2-week-F/U.  She was seen on 12.3.19 for depression and fibromyalgia.  At that time Zoloft was increased to ta total of 150mg  daily and Lyrica 75mg  bid was added.  Labs were drawn which assured that there were no signs of heart failure as the causative of edema.  Her Vitamin-D was low at 26.79 but pt did not respond to advise if she was still taking.) and Cough (Patient was seen on 12.12.19 by Dr. Bryan Lemma and Dx with Bronchitis.  She was treated with abx, prednisone, and cough med.  She is better than she was last week but the cough is not any better.  Is requesting to change from the current cough syrup to Tussionex for efficacy.)  on 05/26/2018  HPI:  She saw Dr. Bryan Lemma for URI symptoms on 05/21/18 (5 days ago)- note reviewed. Diagnosed with bronchitis. Treated with Biaxin 500 mg twice daily x 10 days, prednisone taper, cheratussin/tessalon as needed for cough. She does feel better but cough is lingering, especially at night.  She is asking for a strong cough suppressant at night.  I last saw her on 05/12/18.  Note reviewed.  At that [place], she felt her fibromyalgia and depression were worsening.  Her depression was previously controlled with zoloft 100 mg daily.  She has had a difficult year with increased fibromyalgia pain, also fell and broke her arm.  PHQ 9 was 23 and GAD7 was 21 at that OV on 05/12/18.  She denied any SI or HI.    She had been experiencing more anhedonia, staying in bed and developing more lower extremity edema and weight gain.  Wt Readings from Last 3 Encounters:  05/26/18 (!) 369 lb 6.4 oz (167.6 kg)  05/21/18 (!) 371 lb 9.6 oz (168.6 kg)  05/12/18 (!) 378 lb (171.5 kg)   BNP was normal at 36. Vit D was a little low at 26.79.  Advised to  restart taking her Vitamin D at previous dose- 5000 IU twice daily. She deferred psychotherapy at previous OV. We also increased her zoloft to 150 mg daily and added Lyrica 75 mg twice daily. She does feel that her arms and legs are less painful when she's touched or she bumps her leg.  Feels depression is better- mentally "I am more awake."  "I am less irritable."  Depression screen [facility] 2/9 05/26/2018 05/12/2018 06/24/2017  Decreased Interest 2 3 2   Down, Depressed, Hopeless 2 3 2   PHQ - 2 Score 4 6 4   Altered sleeping 3 3 2   Tired, decreased energy 3 3 3   Change in appetite 1 3 1   Feeling bad or failure about yourself  1 3 3   Trouble concentrating 2 3 0  Moving slowly or fidgety/restless 1 2 1   Suicidal thoughts - 0 0  PHQ-9 Score 15 23 14   Difficult doing work/chores Somewhat difficult Very difficult Extremely dIfficult   GAD 7 : Generalized Anxiety Score 05/26/2018 05/12/2018  Nervous, Anxious, on Edge 2 3  Control/stop worrying 3 3  Worry too much - different things 3 3  Trouble relaxing 2 3  Restless 1 3  Easily annoyed or irritable 1 3  Afraid - awful might happen 0 3  Total GAD 7 Score 12 21  Anxiety Difficulty Somewhat difficult Very difficult      Current Outpatient Medications on File Prior to Visit  Medication Sig Dispense Refill  . Alum & Mag Hydroxide-Simeth (ANTACID ANTI-GAS PO) Take 1 tablet by mouth daily as needed (for gas).    . Ascorbic Acid (VITAMIN C) 1000 MG tablet Take 1,000 mg by mouth 2 (two) times daily.    Marland Kitchen aspirin EC 81 MG tablet Take 81 mg by mouth daily.    Marland Kitchen aspirin-acetaminophen-caffeine (EXCEDRIN MIGRAINE) 250-250-65 MG tablet Take 2 tablets by mouth 2 (two) times daily as needed for headache or migraine.    Marland Kitchen b complex vitamins tablet Take 1 tablet by mouth daily.    . benzonatate (TESSALON) 100 MG capsule Take 1 capsule (100 mg total) by mouth 3 (three) times daily as needed for cough. 30 capsule 0  . Calcium Carbonate-Vitamin D (CALCIUM-D  PO) Take 1,200 mg by mouth 2 (two) times daily.    . Cholecalciferol (VITAMIN D-3) 5000 units TABS Take 5,000 Units by mouth 2 (two) times daily.    . clarithromycin (BIAXIN) 500 MG tablet Take 1 tablet (500 mg total) by mouth 2 (two) times daily for 10 days. 20 tablet 0  . diphenhydrAMINE (BENADRYL) 25 MG tablet Take 25-50 mg by mouth every 6 (six) hours as needed for itching, allergies or sleep.    Marland Kitchen docusate sodium (COLACE) 100 MG capsule Take 1 capsule (100 mg total) by mouth 2 (two) times daily as needed for mild constipation. 20 capsule 1  . ECHINACEA-GOLDEN SEAL PO Take 1 tablet by mouth 2 (two) times daily as needed (immune system boost).     Marland Kitchen guaiFENesin-codeine (CHERATUSSIN AC) 100-10 MG/5ML syrup Take 5 mLs by mouth 3 (three) times daily as needed for cough. 120 mL 0  . hydrochlorothiazide (HYDRODIURIL) 25 MG tablet Take 1 tablet (25 mg total) by mouth daily. (Patient taking differently: Take 25-50 mg by mouth daily. ) 30 tablet 11  . lisinopril (PRINIVIL,ZESTRIL) 10 MG tablet Take 1 tablet (10 mg total) by mouth daily. (Patient taking differently: Take 9 mg by mouth at bedtime. ) 90 tablet 3  . Magnesium 500 MG TABS Take 500 mg by mouth at bedtime.    . Melatonin 10 MG TABS Take 1 tablet by mouth at bedtime.     . Multiple Vitamin (MULTIVITAMIN WITH MINERALS) TABS tablet Take 1 tablet by mouth daily.    . Multiple Vitamins-Minerals (ZINC PO) Take 500 mg by mouth daily as needed (immune system boost).    . mupirocin ointment (BACTROBAN) 2 % Apply 1 application topically 2 (two) times daily. To affected area 30 g 1  . polyethylene glycol (MIRALAX / GLYCOLAX) packet Take 17 g by mouth daily as needed for moderate constipation or severe constipation. 14 each 0  . predniSONE (DELTASONE) 20 MG tablet 3 tabs po x 3 days, 2 tabs po x 3 days, 1 tab po x 3 days, 1/2 tab po x 3 days 20 tablet 0  . pregabalin (LYRICA) 75 MG capsule Take 1 capsule (75 mg total) by mouth 2 (two) times daily. 60  capsule 1  . senna-docusate (SENOKOT-S) 8.6-50 MG tablet Take 1 tablet by mouth at bedtime as needed for mild constipation. 15 tablet 0  . sertraline (ZOLOFT) 100 MG tablet Take 1.5 tablets (150 mg total) by mouth daily. 30 tablet 3  . silver sulfADIAZINE (SILVADENE) 1 % cream Apply 1 application topically 2 (two) times daily. (Patient taking differently: Apply 1 application topically  2 (two) times daily as needed (wound care). ) 50 g 0  . St Johns Wort 300 MG CAPS Take 900 mg by mouth 2 (two) times daily.     . VENTOLIN HFA 108 (90 Base) MCG/ACT inhaler INHALE 2 PUFFS BY MOUTH EVERY 6 HOURS AS NEEDED 18 each 1   No current facility-administered medications on file prior to visit.     Allergies  Allergen Reactions  . Benzalkonium Chloride Anaphylaxis and Hives  . Neosporin [Neomycin-Bacitracin Zn-Polymyx] Anaphylaxis and Hives  . Polysporin [Bacitracin-Polymyxin B] Hives  . Bee Venom Hives and Swelling    Throat swelling  . Bupropion Hcl Other (See Comments)    angry  . Cephalexin Hives and Swelling  . Latex     Past Medical History:  Diagnosis Date  . Asthma   . Depression   . Family history of adverse reaction to anesthesia    FATHER WAS SLOW TO WAKE UP  . Headache    OCCASIONAL MIGRAINES  . HNP (herniated nucleus pulposus), lumbar   . Hx of epistaxis   . Insomnia   . OA (osteoarthritis)   . Obesity   . PCOS (polycystic ovarian syndrome)   . Sensitive skin   . Vitamin D deficiency     Past Surgical History:  Procedure Laterality Date  . LUMBAR LAMINECTOMY/DECOMPRESSION MICRODISCECTOMY Right 06/02/2014   Procedure: MICRO LUMBAR DECOMPRESSION L4 - L5 ON THE RIGHT 1 LEVEL;  Surgeon: Johnn Hai, MD;  Location: [facility];  Service: Orthopedics;  Laterality: Right;  . ORIF HUMERUS FRACTURE Left 08/01/2017   Procedure: OPEN REDUCTION INTERNAL FIXATION (ORIF) PROXIMAL HUMERUS FRACTURE;  Surgeon: Shona Needles, MD;  Location: [place];  Service: Orthopedics;  Laterality: Left;    . WISDOM TOOTH EXTRACTION      Family History  Problem Relation Age of Onset  . Heart attack Father 52  . Breast cancer Maternal Aunt 38    Social History   Socioeconomic History  . Marital status: Single    Spouse name: Not on file  . Number of children: Not on file  . Years of education: Not on file  . Highest education level: Not on file  Occupational History  . Not on file  Social Needs  . Financial resource strain: Not on file  . Food insecurity:    Worry: Not on file    Inability: Not on file  . Transportation needs:    Medical: Not on file    Non-medical: Not on file  Tobacco Use  . Smoking status: Former Smoker    Last attempt to quit: 05/26/1990    Years since quitting: 28.0  . Smokeless tobacco: Never Used  Substance and Sexual Activity  . Alcohol use: No    Alcohol/week: 0.0 standard drinks  . Drug use: No  . Sexual activity: Not on file  Lifestyle  . Physical activity:    Days per week: Not on file    Minutes per session: Not on file  . Stress: Not on file  Relationships  . Social connections:    Talks on phone: Not on file    Gets together: Not on file    Attends religious service: Not on file    Active member of club or organization: Not on file    Attends meetings of clubs or organizations: Not on file    Relationship status: Not on file  . Intimate partner violence:    Fear of current or ex partner: Not on file  Emotionally abused: Not on file    Physically abused: Not on file    Forced sexual activity: Not on file  Other Topics Concern  . Not on file  Social History Narrative  . Not on file   The PMH, PSH, Social History, Family History, Medications, and allergies have been reviewed in [facility], and have been updated if relevant.   Review of Systems  Constitutional: Negative.   HENT: Negative.   Eyes: Negative.   Respiratory: Positive for cough.   Cardiovascular: Negative.   Gastrointestinal: Negative.   Endocrine: Negative.    Genitourinary: Negative.   Musculoskeletal: Negative.   Allergic/Immunologic: Negative.   Neurological: Negative.   All other systems reviewed and are negative.      Objective:    BP 132/86 (BP Location: Left Arm, Patient Position: Sitting, Cuff Size: Large)   Pulse 84   Temp 98.4 F (36.9 C) (Oral)   Ht 5\' 7"  (1.702 m)   Wt (!) 369 lb 6.4 oz (167.6 kg)   SpO2 98%   BMI 57.86 kg/m    Physical Exam        Assessment & Plan:   Fibromyalgia  Depression, unspecified depression type  BMI 60.0-69.9, adult (HCC)  Edema, unspecified type  Acute bronchitis, unspecified organism No follow-ups on file.

## 2018-06-09 ENCOUNTER — Ambulatory Visit (INDEPENDENT_AMBULATORY_CARE_PROVIDER_SITE_OTHER): Payer: BLUE CROSS/BLUE SHIELD | Admitting: Physical Medicine and Rehabilitation

## 2018-06-09 ENCOUNTER — Encounter (INDEPENDENT_AMBULATORY_CARE_PROVIDER_SITE_OTHER): Payer: Self-pay | Admitting: Physical Medicine and Rehabilitation

## 2018-06-09 ENCOUNTER — Ambulatory Visit (INDEPENDENT_AMBULATORY_CARE_PROVIDER_SITE_OTHER): Payer: Self-pay

## 2018-06-09 VITALS — BP 145/83 | HR 80 | Temp 97.7°F

## 2018-06-09 DIAGNOSIS — M5116 Intervertebral disc disorders with radiculopathy, lumbar region: Secondary | ICD-10-CM | POA: Diagnosis not present

## 2018-06-09 DIAGNOSIS — M5416 Radiculopathy, lumbar region: Secondary | ICD-10-CM

## 2018-06-09 MED ORDER — METHYLPREDNISOLONE ACETATE 80 MG/ML IJ SUSP
80.0000 mg | Freq: Once | INTRAMUSCULAR | Status: AC
  Administered 2018-06-09: 80 mg

## 2018-06-09 NOTE — Patient Instructions (Signed)

## 2018-06-09 NOTE — Progress Notes (Signed)
 .  Numeric Pain Rating Scale and Functional Assessment Average Pain 7   In the last MONTH (on 0-10 scale) has pain interfered with the following?  1. General activity like being  able to carry out your everyday physical activities such as walking, climbing stairs, carrying groceries, or moving a chair?  Rating(5)   +Driver, -BT, -Dye Allergies.  

## 2018-06-16 ENCOUNTER — Encounter: Payer: Self-pay | Admitting: Physical Therapy

## 2018-06-16 DIAGNOSIS — M5442 Lumbago with sciatica, left side: Secondary | ICD-10-CM

## 2018-06-16 DIAGNOSIS — G8929 Other chronic pain: Secondary | ICD-10-CM

## 2018-06-16 DIAGNOSIS — M25612 Stiffness of left shoulder, not elsewhere classified: Secondary | ICD-10-CM

## 2018-06-16 DIAGNOSIS — M6281 Muscle weakness (generalized): Secondary | ICD-10-CM

## 2018-06-16 DIAGNOSIS — M25512 Pain in left shoulder: Secondary | ICD-10-CM

## 2018-06-16 DIAGNOSIS — M62838 Other muscle spasm: Secondary | ICD-10-CM

## 2018-06-16 NOTE — Therapy (Signed)
[facility] [address] [place], [address] Phone: [phone]   Fax:  [phone]  Physical Therapy Discharge Summary Reporting Period: 08/14/2017 - 04/07/2018  Patient Details  Name: Danielle Irwin MRN: [id number] Date of Birth: Feb 09, 1971 Referring Provider (PT): Katha Hamming   Encounter Date: 06/16/2018    Past Medical History:  Diagnosis Date  . Asthma   . Depression   . Family history of adverse reaction to anesthesia    FATHER WAS SLOW TO WAKE UP  . Headache    OCCASIONAL MIGRAINES  . HNP (herniated nucleus pulposus), lumbar   . Hx of epistaxis   . Insomnia   . OA (osteoarthritis)   . Obesity   . PCOS (polycystic ovarian syndrome)   . Sensitive skin   . Vitamin D deficiency     Past Surgical History:  Procedure Laterality Date  . LUMBAR LAMINECTOMY/DECOMPRESSION MICRODISCECTOMY Right 06/02/2014   Procedure: MICRO LUMBAR DECOMPRESSION L4 - L5 ON THE RIGHT 1 LEVEL;  Surgeon: Johnn Hai, MD;  Location: [facility];  Service: Orthopedics;  Laterality: Right;  . ORIF HUMERUS FRACTURE Left 08/01/2017   Procedure: OPEN REDUCTION INTERNAL FIXATION (ORIF) PROXIMAL HUMERUS FRACTURE;  Surgeon: Shona Needles, MD;  Location: [place];  Service: Orthopedics;  Laterality: Left;  . WISDOM TOOTH EXTRACTION      There were no vitals filed for this visit.  Subjective Assessment - 06/16/18 1649    Subjective  Patient did not return following her last physical therapy session.  She is now being discharged due to lack of attendence for over 30 days.  Please see previous documentation for last subjective exam.     Limitations  Lifting;Walking;Sitting;House hold activities    How long can you sit comfortably?  30 min    How long can you stand comfortably?  1 hour    How long can you walk comfortably?  15-20 min (limited by physical deconditioning, not back or shoulder pain).     Patient Stated Goals  to be able to  use her arm again for personal care, return to all ADL's with minimal difficutly    Pain Onset  More than a month ago    Pain Onset  More than a month ago    Pain Onset  1 to 4 weeks ago        OBJECTIVE:  Pt is not present for measurement. Please see previous documentation for last objective measures.    PT Long Term Goals - 06/16/18 1657      PT LONG TERM GOAL #1   Title  Patient will improve FOTO score to 25/100 demonstrating improvement with functional use left UE for daily tasks    Baseline  FOTO 4/100; 09/04/17 4/100 no change due to severity of injury/surgery and continued restrictions on use/movement; 11/10/17 26/100    Status  Achieved    Target Date  12/23/17      PT LONG TERM GOAL #2   Title  Patient will improve FOTO score to 50/100 demonstrating improvement with functional use left UE for daily tasks    Baseline  FOTO 4/100; 12/10/2017 38/100 01/26/18 43/100; 04/07/2018 45/100    Time  6    Status  Partially Met    Target Date  05/19/18      PT LONG TERM GOAL #3   Title  patient will be independent with home exercises for flexibility and strength to allow transition to self management once  discharged from physical therapy    Baseline  has not yet received final HEP, reports reduction in HEP completion since starting back to work    Time  6    Period  Weeks    Status  Partially Met    Target Date  05/19/18      PT LONG TERM GOAL #4   Title  Patient will demonstrate lumbar range of motion to [facility] to improve ability to perform ADLs such as reaching, dressing, sitting, drivig, etc.     Time  6    Period  Weeks    Status  Not Met    Target Date  05/19/18      PT LONG TERM GOAL #5   Title  Patient will demonstrate LE strength of at least 4+/5 to increase ease with performing functional activities such as transfers, stair navigation, walking, etc.    Baseline  varies between 4 to 5/10 BLE, left slightly weaker than right.     Time  6    Period  Weeks    Status  Not Met     Target Date  05/19/18            Plan - 06/16/18 1657    Clinical Impression Statement  Patient has attended 45 physical therapy treatment sessions this episode of care. She did not return for continued treatment after her last visit and is being discharged due to over 30 days absent from care. At her last visit (see last progress note) pt had returned to part time work with plans to return to full time but continued to report limitations due to pain, reduced ROM, and strength deficits. She continued to demonstrate significant impairments in shoulder ROM, pain, function, and strength and had not yet achieved her goals. She continued to have decreased function from low back pain with radicular symptoms down her leg that responded well at her last treatment to repeated motions treatment. Over the course of her care, she met one long term goal and made progress towards her other goals. She is now discharged for lack of participation.     Clinical Impairments Affecting Rehab Potential  (+) motivated, acute condition (-)obesity, DDD, lumbar, chronicity of symptoms    PT Frequency  2x / week    PT Duration  6 weeks    PT Treatment/Interventions  Manual techniques;Neuromuscular re-education;Patient/family education;Cryotherapy;Electrical Stimulation;Moist Heat;Therapeutic exercise;Therapeutic activities;Balance training;Scar mobilization;Passive range of motion;Iontophoresis '4mg'$ /ml Dexamethasone;Gait training;Stair training;Dry needling;Ultrasound;Traction;Functional mobility training    PT Next Visit Plan  Patient is now discharged from physical therapy    PT [facility] Access Code: [email or web address]     Consulted and Agree with Plan of Care  Patient       Patient will benefit from skilled therapeutic intervention in order to improve the following deficits and impairments:  Pain, Impaired UE functional use, Impaired perceived functional ability, Decreased strength, Decreased range of  motion, Decreased endurance, Decreased activity tolerance, Decreased balance, Obesity  Visit Diagnosis: Stiffness of left shoulder, not elsewhere classified  Acute pain of left shoulder  Chronic left-sided low back pain with left-sided sciatica  Muscle weakness (generalized)  Other muscle spasm     Problem List Patient Active Problem List   Diagnosis Date Noted  . Bronchitis, acute 05/26/2018  . Fibromyalgia 05/12/2018  . Edema 05/12/2018  . BMI 60.0-69.9, adult ([place]) 05/12/2018  . DDD (degenerative disc disease), thoracolumbar 01/06/2018  . DVT (deep venous thrombosis) ([place]) 08/29/2017  . Leukocytosis 08/06/2017  .  Humerus fracture 08/01/2017  . CPAP (continuous positive airway pressure) dependence 08/01/2017  . Asthma 08/01/2017  . HTN (hypertension) 06/24/2017  . Depression 11/07/2015  . Systolic murmur 65/79/0383  . Morbid obesity ([place]) 10/26/2014  . Insomnia 06/21/2014  . Anxiety 06/21/2014  . Spinal stenosis at L4-L5 level 06/02/2014  . POLYCYSTIC OVARIAN DISEASE 06/19/2010    Nancy Nordmann, PT, DPT 06/16/2018, 4:58 PM  [facility] [address] [place], [address] Phone: [phone]   Fax:  [phone]  Name: Danielle Irwin MRN: [id number] Date of Birth: 07-19-1970

## 2018-07-16 ENCOUNTER — Other Ambulatory Visit: Payer: Self-pay | Admitting: Family Medicine

## 2018-07-23 ENCOUNTER — Other Ambulatory Visit: Payer: Self-pay

## 2018-07-23 MED ORDER — PREGABALIN 75 MG PO CAPS: 75.0000 mg | ORAL_CAPSULE | Freq: Two times a day (BID) | ORAL | 3 refills | Status: DC

## 2018-07-23 NOTE — Telephone Encounter (Signed)
TA-LOV was 12.17.19/next due in June/last filled 1.8.20/per [place] PMP pt is compliant/plz advise/thx dmf

## 2018-08-26 ENCOUNTER — Ambulatory Visit: Payer: BLUE CROSS/BLUE SHIELD | Admitting: Nurse Practitioner

## 2018-08-27 ENCOUNTER — Telehealth: Payer: Self-pay | Admitting: Nurse Practitioner

## 2018-08-27 ENCOUNTER — Other Ambulatory Visit: Payer: Self-pay

## 2018-08-27 ENCOUNTER — Ambulatory Visit: Payer: BLUE CROSS/BLUE SHIELD | Admitting: Nurse Practitioner

## 2018-08-27 ENCOUNTER — Telehealth: Payer: Self-pay | Admitting: Family Medicine

## 2018-08-27 ENCOUNTER — Encounter: Payer: Self-pay | Admitting: Nurse Practitioner

## 2018-08-27 VITALS — BP 114/82 | HR 101 | Temp 98.3°F | Ht 67.0 in | Wt 369.0 lb

## 2018-08-27 DIAGNOSIS — K529 Noninfective gastroenteritis and colitis, unspecified: Secondary | ICD-10-CM

## 2018-08-27 MED ORDER — ONDANSETRON HCL 4 MG PO TABS: 4.0000 mg | ORAL_TABLET | Freq: Three times a day (TID) | ORAL | 0 refills | Status: DC | PRN

## 2018-08-27 MED ORDER — BISMUTH SUBSALICYLATE 262 MG/15ML PO SUSP: 30.0000 mL | Freq: Three times a day (TID) | ORAL | 0 refills | Status: AC

## 2018-08-27 NOTE — Telephone Encounter (Signed)
Questions for Screening COVID-19  Symptom onset: 5 days  Travel or Contacts: No  During this illness, did/does the patient experience any of the following symptoms? Fever >100.72F []   Yes [x]   No []   Unknown Subjective fever (felt feverish) [x]   Yes []   No []   Unknown Chills [x]   Yes []   No []   Unknown Muscle aches (myalgia) []   Yes [x]   No []   Unknown Runny nose (rhinorrhea) [x]   Yes []   No []   Unknown Sore throat []   Yes [x]   No []   Unknown Cough (new onset or worsening of chronic cough) [x]   Yes []   No []   Unknown Shortness of breath (dyspnea) [x]   Yes []   No []   Unknown Nausea or vomiting [x]   Yes []   No []   Unknown Headache [x]   Yes []   No []   Unknown Abdominal pain  []   Yes [x]   No []   Unknown Diarrhea (?3 loose/looser than normal stools/24hr period) [x]   Yes []   No []   Unknown Other, specify: FYI--pt report some dizziness as well. Pt stated she has seasonal allergy, her nose runs,little coughing-no mucus,SOB once in a while and she is using inhaler for this. Her Temp runs 97-98 F.   Patient risk factors: Smoker? []   Current []   Former [x]   Never If female, currently pregnant? []   Yes [x]   No  Patient Active Problem List   Diagnosis Date Noted  . Bronchitis, acute 05/26/2018  . Fibromyalgia 05/12/2018  . Edema 05/12/2018  . BMI 60.0-69.9, adult ([place]) 05/12/2018  . DDD (degenerative disc disease), thoracolumbar 01/06/2018  . DVT (deep venous thrombosis) ([place]) 08/29/2017  . Leukocytosis 08/06/2017  . Humerus fracture 08/01/2017  . CPAP (continuous positive airway pressure) dependence 08/01/2017  . Asthma 08/01/2017  . HTN (hypertension) 06/24/2017  . Depression 11/07/2015  . Systolic murmur 16/03/9603  . Morbid obesity ([place]) 10/26/2014  . Insomnia 06/21/2014  . Anxiety 06/21/2014  . Spinal stenosis at L4-L5 level 06/02/2014  . POLYCYSTIC OVARIAN DISEASE 06/19/2010    Plan:  []   High risk for COVID-19 with red flags go to ED (with CP, SOB, weak/lightheaded, or fever  > 101.5). Call ahead.  []   High risk for COVID-19 but stable will have car visit. Inform provider and coordinate time. Will be completed in afternoon. [x]   No red flags but URI signs or symptoms will go through side door and be seen in dedicated room. Per Baldo Ash, pt can come in normal entrance door but to wear mask.   Note: Referral to telemedicine is an appropriate alternative disposition for higher risk but stable. [facility]/e-Visit: [phone].

## 2018-08-27 NOTE — Patient Instructions (Signed)
Food Choices to Help Relieve Diarrhea, Adult  When you have diarrhea, the foods you eat and your eating habits are very important. Choosing the right foods and drinks can help:   Relieve diarrhea.   Replace lost fluids and nutrients.   Prevent dehydration.  What general guidelines should I follow?    Relieving diarrhea   Choose foods with less than 2 g or .07 oz. of fiber per serving.   Limit fats to less than 8 tsp (38 g or 1.34 oz.) a day.   Avoid the following:  ? Foods and beverages sweetened with high-fructose corn syrup, honey, or sugar alcohols such as xylitol, sorbitol, and mannitol.  ? Foods that contain a lot of fat or sugar.  ? Fried, greasy, or spicy foods.  ? High-fiber grains, breads, and cereals.  ? Raw fruits and vegetables.   Eat foods that are rich in probiotics. These foods include dairy products such as yogurt and fermented milk products. They help increase healthy bacteria in the stomach and intestines (gastrointestinal tract, or GI tract).   If you have lactose intolerance, avoid dairy products. These may make your diarrhea worse.   Take medicine to help stop diarrhea (antidiarrheal medicine) only as told by your health care provider.  Replacing nutrients   Eat small meals or snacks every 3-4 hours.   Eat bland foods, such as white rice, toast, or baked potato, until your diarrhea starts to get better. Gradually reintroduce nutrient-rich foods as tolerated or as told by your health care provider. This includes:  ? Well-cooked protein foods.  ? Peeled, seeded, and soft-cooked fruits and vegetables.  ? Low-fat dairy products.   Take vitamin and mineral supplements as told by your health care provider.  Preventing dehydration   Start by sipping water or a special solution to prevent dehydration (oral rehydration solution, ORS). Urine that is clear or pale yellow means that you are getting enough fluid.   Try to drink at least 8-10 cups of fluid each day to help replace lost  fluids.   You may add other liquids in addition to water, such as clear juice or decaffeinated sports drinks, as tolerated or as told by your health care provider.   Avoid drinks with caffeine, such as coffee, tea, or soft drinks.   Avoid alcohol.  What foods are recommended?         The items listed may not be a complete list. Talk with your health care provider about what dietary choices are best for you.  Grains  White rice. White, French, or pita breads (fresh or toasted), including plain rolls, buns, or bagels. White pasta. Saltine, soda, or graham crackers. Pretzels. Low-fiber cereal. Cooked cereals made with water (such as cornmeal, farina, or cream cereals). Plain muffins. Matzo. Melba toast. Zwieback.  Vegetables  Potatoes (without the skin). Most well-cooked and canned vegetables without skins or seeds. Tender lettuce.  Fruits  Apple sauce. Fruits canned in juice. Cooked apricots, cherries, grapefruit, peaches, pears, or plums. Fresh bananas and cantaloupe.  Meats and other protein foods  Baked or boiled chicken. Eggs. Tofu. Fish. Seafood. Smooth nut butters. Ground or well-cooked tender beef, ham, veal, lamb, pork, or poultry.  Dairy  Plain yogurt, kefir, and unsweetened liquid yogurt. Lactose-free milk, buttermilk, skim milk, or soy milk. Low-fat or nonfat hard cheese.  Beverages  Water. Low-calorie sports drinks. Fruit juices without pulp. Strained tomato and vegetable juices. Decaffeinated teas. Sugar-free beverages not sweetened with sugar alcohols. Oral rehydration solutions, if   approved by your health care provider.  Seasoning and other foods  Bouillon, broth, or soups made from recommended foods.  What foods are not recommended?  The items listed may not be a complete list. Talk with your health care provider about what dietary choices are best for you.  Grains  Whole grain, whole wheat, bran, or rye breads, rolls, pastas, and crackers. Wild or brown rice. Whole grain or bran cereals. Barley.  Oats and oatmeal. Corn tortillas or taco shells. Granola. Popcorn.  Vegetables  Raw vegetables. Fried vegetables. Cabbage, broccoli, Brussels sprouts, artichokes, baked beans, beet greens, corn, kale, legumes, peas, sweet potatoes, and yams. Potato skins. Cooked spinach and cabbage.  Fruits  Dried fruit, including raisins and dates. Raw fruits. Stewed or dried prunes. Canned fruits with syrup.  Meat and other protein foods  Fried or fatty meats. Deli meats. Chunky nut butters. Nuts and seeds. Beans and lentils. Bacon. Hot dogs. Sausage.  Dairy  High-fat cheeses. Whole milk, chocolate milk, and beverages made with milk, such as milk shakes. Half-and-half. Cream. sour cream. Ice cream.  Beverages  Caffeinated beverages (such as coffee, tea, soda, or energy drinks). Alcoholic beverages. Fruit juices with pulp. Prune juice. Soft drinks sweetened with high-fructose corn syrup or sugar alcohols. High-calorie sports drinks.  Fats and oils  Butter. Cream sauces. Margarine. Salad oils. Plain salad dressings. Olives. Avocados. Mayonnaise.  Sweets and desserts  Sweet rolls, doughnuts, and sweet breads. Sugar-free desserts sweetened with sugar alcohols such as xylitol and sorbitol.  Seasoning and other foods  Honey. Hot sauce. Chili powder. Gravy. Cream-based or milk-based soups. Pancakes and waffles.  Summary   When you have diarrhea, the foods you eat and your eating habits are very important.   Make sure you get at least 8-10 cups of fluid each day, or enough to keep your urine clear or pale yellow.   Eat bland foods and gradually reintroduce healthy, nutrient-rich foods as tolerated, or as told by your health care provider.   Avoid high-fiber, fried, greasy, or spicy foods.  This information is not intended to replace advice given to you by your health care provider. Make sure you discuss any questions you have with your health care provider.  Document Released: 08/17/2003 Document Revised: 05/24/2016 Document Reviewed:  05/24/2016  Elsevier Interactive Patient Education  2019 [employer]

## 2018-08-27 NOTE — Progress Notes (Signed)
Subjective:  Patient ID: Danielle Irwin, female    DOB: [birth year]  Age: 48 y.o. MRN: [id number]  CC: Diarrhea (pt c/o of diarrhea,felt feverish,chills,mucscle pain,headache/going on 5 days/)   Diarrhea   This is a new problem. The current episode started in the past 7 days. The problem occurs 2 to 4 times per day. The problem has been unchanged. The stool consistency is described as watery. The patient states that diarrhea does not awaken her from sleep. Associated symptoms include abdominal pain, bloating, chills, headaches, increased flatus, myalgias and vomiting. Pertinent negatives include no coughing, fever, sweats, URI or weight loss. Risk factors include ill contacts and suspect food intake. She has tried bismuth subsalicylate and anti-motility drug for the symptoms. The treatment provided mild relief.  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  Reviewed past Medical, Social and Family history today.  Outpatient Medications Prior to Visit  Medication Sig Dispense Refill   Alum & Mag Hydroxide-Simeth (ANTACID ANTI-GAS PO) Take 1 tablet by mouth daily as needed (for gas).     Ascorbic Acid (VITAMIN C) 1000 MG tablet Take 1,000 mg by mouth 2 (two) times daily.     aspirin EC 81 MG tablet Take 81 mg by mouth daily.     aspirin-acetaminophen-caffeine (EXCEDRIN MIGRAINE) 250-250-65 MG tablet Take 2 tablets by mouth 2 (two) times daily as needed for headache or migraine.     b complex vitamins tablet Take 1 tablet by mouth daily.     Calcium Carbonate-Vitamin D (CALCIUM-D PO) Take 1,200 mg by mouth 2 (two) times daily.     Cholecalciferol (VITAMIN D-3) 5000 units TABS Take 5,000 Units by mouth 2 (two) times daily.     diphenhydrAMINE (BENADRYL) 25 MG tablet Take 25-50 mg by mouth every 6 (six) hours as needed for itching, allergies or sleep.     docusate sodium (COLACE) 100 MG capsule Take 1 capsule (100 mg total) by mouth 2 (two) times daily as needed for mild  constipation. 20 capsule 1   ECHINACEA-GOLDEN SEAL PO Take 1 tablet by mouth 2 (two) times daily as needed (immune system boost).      hydrochlorothiazide (HYDRODIURIL) 25 MG tablet TAKE 1 TABLET BY MOUTH ONCE DAILY 90 tablet 1   lisinopril (PRINIVIL,ZESTRIL) 10 MG tablet TAKE 1 TABLET BY MOUTH ONCE DAILY 90 tablet 1   Magnesium 500 MG TABS Take 500 mg by mouth at bedtime.     Melatonin 10 MG TABS Take 1 tablet by mouth at bedtime.      Multiple Vitamin (MULTIVITAMIN WITH MINERALS) TABS tablet Take 1 tablet by mouth daily.     Multiple Vitamins-Minerals (ZINC PO) Take 500 mg by mouth daily as needed (immune system boost).     mupirocin ointment (BACTROBAN) 2 % Apply 1 application topically 2 (two) times daily. To affected area 30 g 1   pregabalin (LYRICA) 75 MG capsule Take 1 capsule (75 mg total) by mouth 2 (two) times daily. 60 capsule 3   senna-docusate (SENOKOT-S) 8.6-50 MG tablet Take 1 tablet by mouth at bedtime as needed for mild constipation. 15 tablet 0   sertraline (ZOLOFT) 100 MG tablet Take 1.5 tablets (150 mg total) by mouth daily. 45 tablet 3   silver sulfADIAZINE (SILVADENE) 1 % cream Apply 1 application topically 2 (two) times daily. (Patient taking differently: Apply 1 application topically 2 (two) times daily as needed (wound care). ) 50 g 0   St Johns Wort 300 MG CAPS Take 900 mg  by mouth 2 (two) times daily.      VENTOLIN HFA 108 (90 Base) MCG/ACT inhaler INHALE 2 PUFFS BY MOUTH EVERY 6 HOURS AS NEEDED 18 each 1   polyethylene glycol (MIRALAX / GLYCOLAX) packet Take 17 g by mouth daily as needed for moderate constipation or severe constipation. 14 each 0   benzonatate (TESSALON) 100 MG capsule Take 1 capsule (100 mg total) by mouth 3 (three) times daily as needed for cough. (Patient not taking: Reported on 08/27/2018) 30 capsule 0   chlorpheniramine-HYDROcodone (TUSSIONEX PENNKINETIC ER) 10-8 MG/5ML SUER Take 5 mLs by mouth every 12 (twelve) hours as needed.  (Patient not taking: Reported on 08/27/2018) 140 mL 0   guaiFENesin-codeine (CHERATUSSIN AC) 100-10 MG/5ML syrup Take 5 mLs by mouth 3 (three) times daily as needed for cough. (Patient not taking: Reported on 08/27/2018) 120 mL 0   predniSONE (DELTASONE) 20 MG tablet 3 tabs po x 3 days, 2 tabs po x 3 days, 1 tab po x 3 days, 1/2 tab po x 3 days (Patient not taking: Reported on 08/27/2018) 20 tablet 0   No facility-administered medications prior to visit.     ROS See HPI  Objective:  BP 114/82    Pulse (!) 101    Temp 98.3 F (36.8 C) (Oral)    Ht 5\' 7"  (1.702 m)    Wt (!) 369 lb (167.4 kg)    SpO2 97%    BMI 57.79 kg/m   BP Readings from Last 3 Encounters:  08/27/18 114/82  06/09/18 (!) 145/83  05/26/18 132/86    Wt Readings from Last 3 Encounters:  08/27/18 (!) 369 lb (167.4 kg)  05/26/18 (!) 369 lb 6.4 oz (167.6 kg)  05/21/18 (!) 371 lb 9.6 oz (168.6 kg)    Physical Exam Vitals signs reviewed.  Constitutional:      General: She is not in acute distress.    Appearance: She is not toxic-appearing or diaphoretic.  Cardiovascular:     Rate and Rhythm: Normal rate and regular rhythm.     Heart sounds: Normal heart sounds.  Pulmonary:     Effort: Pulmonary effort is normal.     Breath sounds: Normal breath sounds.  Abdominal:     General: Bowel sounds are normal. There is no distension.     Palpations: Abdomen is soft.     Tenderness: There is abdominal tenderness. There is no right CVA tenderness, left CVA tenderness or guarding.     Comments: Diffuse tenderness.  Skin:    Findings: No erythema or rash.  Neurological:     Mental Status: She is alert and oriented to person, place, and time.     Lab Results  Component Value Date   WBC 9.1 08/06/2017   HGB 9.3 (L) 08/06/2017   HCT 27.2 (L) 08/06/2017   PLT  08/06/2017    296.0 Result may be falsely decreased due to platelet clumping.   GLUCOSE 142 (H) 05/12/2018   CHOL 210 (H) 12/24/2016   TRIG 150.0 (H) 12/24/2016    HDL 54.90 12/24/2016   LDLDIRECT 129.8 08/21/2010   LDLCALC 125 (H) 12/24/2016   ALT 52 (H) 05/12/2018   AST 38 (H) 05/12/2018   NA 139 05/12/2018   K 4.0 05/12/2018   CL 106 05/12/2018   CREATININE 0.67 05/12/2018   BUN 17 05/12/2018   CO2 26 05/12/2018   TSH 2.30 12/24/2016   HGBA1C 5.7 12/24/2016    Mr Lumbar Spine W Wo Contrast  Result Date:  02/21/2018 CLINICAL DATA:  Severe low back pain radiating to LEFT lower extremity with numbness for 6 weeks. History of RIGHT L4-5 micro discectomy 2015. EXAM: MRI LUMBAR SPINE WITHOUT AND WITH CONTRAST TECHNIQUE: Multiplanar and multiecho pulse sequences of the lumbar spine were obtained without and with intravenous contrast. CONTRAST:  1mL MULTIHANCE GADOBENATE DIMEGLUMINE 529 MG/ML IV SOLN COMPARISON:  Lumbar spine radiographs January 12, 2018 FINDINGS: SEGMENTATION: For the purposes of this report, the last well-formed intervertebral disc is reported as L5-S1. ALIGNMENT: Maintained lumbar lordosis. No malalignment. VERTEBRAE: Vertebral bodies are intact. Mild L3-4, moderate L4-5 and L5-S1 disc height loss with mild disc desiccation L3-4 through L5-S1. Mild chronic discogenic endplate changes lower lumbar spine without acute or abnormal bone marrow signal. No abnormal osseous or disc enhancement. No abnormal bone marrow signal. No abnormal osseous or intradiscal enhancement. CONUS MEDULLARIS AND CAUDA EQUINA: Conus medullaris terminates at T12-L1 and demonstrates normal morphology and signal characteristics. Cauda equina is normal. No abnormal cord, leptomeningeal or epidural enhancement. PARASPINAL AND OTHER SOFT TISSUES: Nonacute. RIGHT paraspinal muscle scarring L4-5. DISC LEVELS: T12-L1 through L2-3: No disc bulge, canal stenosis nor neural foraminal narrowing. L3-4: Annular bulging. Mild facet arthropathy and ligamentum flavum redundancy without canal stenosis or neural foraminal narrowing. L4-5: RIGHT hemilaminectomy. 3 mm broad-based disc bulge  with enhancing annular fissure, possibly postoperative. No canal stenosis. Mild RIGHT greater than LEFT neural foraminal narrowing. L5-S1: 5 mm LEFT central disc protrusion posteriorly displacing the traversing LEFT S1 nerve which is enlarged, edematous and enhancing. Mild facet arthropathy without canal stenosis. Mild LEFT neural foraminal narrowing. IMPRESSION: 1. 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 2. Status post RIGHT L4-5 hemilaminectomy with small disc protrusion and annular fissure. 3. No canal stenosis. Mild L4-5 and L5-S1 neural foraminal narrowing. Electronically Signed   By: Elon Alas M.D.   On: 02/21/2018 23:45    Assessment & Plan:   Danielle Irwin was seen today for diarrhea.  Diagnoses and all orders for this visit:  Gastroenteritis -     ondansetron (ZOFRAN) 4 MG tablet; Take 1-2 tablets (4-8 mg total) by mouth every 8 (eight) hours as needed for nausea or vomiting. -     bismuth subsalicylate (PEPTO-BISMOL) 262 MG/15ML suspension; Take 30 mLs by mouth 4 (four) times daily -  before meals and at bedtime.   I have discontinued Danielle Irwin's polyethylene glycol, predniSONE, benzonatate, guaiFENesin-codeine, and chlorpheniramine-HYDROcodone. I am also having her start on ondansetron and bismuth subsalicylate. Additionally, I am having her maintain her [place], ECHINACEA-GOLDEN SEAL PO, Melatonin, Alum & Mag Hydroxide-Simeth (ANTACID ANTI-GAS PO), docusate sodium, silver sulfADIAZINE, multivitamin with minerals, vitamin C, Vitamin D-3, Magnesium, b complex vitamins, Calcium Carbonate-Vitamin D (CALCIUM-D PO), Multiple Vitamins-Minerals (ZINC PO), aspirin EC, diphenhydrAMINE, aspirin-acetaminophen-caffeine, senna-docusate, mupirocin ointment, Ventolin HFA, sertraline, lisinopril, hydrochlorothiazide, and pregabalin.  Meds ordered this encounter  Medications   ondansetron (ZOFRAN) 4 MG tablet    Sig: Take 1-2 tablets (4-8 mg total) by mouth  every 8 (eight) hours as needed for nausea or vomiting.    Dispense:  20 tablet    Refill:  0    Order Specific Question:   Supervising Provider    Answer:   MATTHEWS, CODY [[id number]]   bismuth subsalicylate (PEPTO-BISMOL) 262 MG/15ML suspension    Sig: Take 30 mLs by mouth 4 (four) times daily -  before meals and at bedtime.    Dispense:  360 mL    Refill:  0    Order  Specific Question:   Supervising Provider    Answer:   Luetta Nutting [[id number]]    Problem List Items Addressed This Visit    None    Visit Diagnoses    Gastroenteritis    -  Primary   Relevant Medications   ondansetron (ZOFRAN) 4 MG tablet   bismuth subsalicylate (PEPTO-BISMOL) 262 MG/15ML suspension       Follow-up: No follow-ups on file.  Wilfred Lacy, NP

## 2018-08-27 NOTE — Telephone Encounter (Signed)
Patient came in 08/27/18 and saw Wilfred Lacy because she was sick and she also had a leave of absence form she left for her provider for her work. Left form in Dr. Hulen Shouts folder up front.

## 2018-09-02 NOTE — Procedures (Signed)
S1 Lumbosacral Transforaminal Epidural Steroid Injection - Sub-Pedicular Approach with Fluoroscopic Guidance   Patient: Danielle Irwin      Date of Birth: [birth year] MRN: [id number] PCP: Lucille Passy, MD      Visit Date: 06/09/2018   Universal Protocol:    Date/Time: 03/25/201:16 PM  Consent Given By: the patient  Position:  PRONE  Additional Comments: Vital signs were monitored before and after the procedure. Patient was prepped and draped in the usual sterile fashion. The correct patient, procedure, and site was verified.   Injection Procedure Details:  Procedure Site One Meds Administered:  Meds ordered this encounter  Medications  . methylPREDNISolone acetate (DEPO-MEDROL) injection 80 mg    Laterality: Left  Location/Site:  S1 Foramen   Needle size: 22 ga.  Needle type: Spinal  Needle Placement: Transforaminal  Findings:   -Comments: Excellent flow of contrast along the nerve and into the epidural space.  Procedure Details: After squaring off the sacral end-plate to get a true AP view, the C-arm was positioned so that the best possible view of the S1 foramen was visualized. The soft tissues overlying this structure were infiltrated with 2-3 ml. of 1% Lidocaine without Epinephrine.    The spinal needle was inserted toward the target using a "trajectory" view along the fluoroscope beam.  Under AP and lateral visualization, the needle was advanced so it did not puncture dura. Biplanar projections were used to confirm position. Aspiration was confirmed to be negative for CSF and/or blood. A 1-2 ml. volume of Isovue-250 was injected and flow of contrast was noted at each level. Radiographs were obtained for documentation purposes.   After attaining the desired flow of contrast documented above, a 0.5 to 1.0 ml test dose of 0.25% Marcaine was injected into each respective transforaminal space.  The patient was observed for 90 seconds post injection.  After no sensory  deficits were reported, and normal lower extremity motor function was noted,   the above injectate was administered so that equal amounts of the injectate were placed at each foramen (level) into the transforaminal epidural space.   Additional Comments:  The patient tolerated the procedure well Dressing: Band-Aid with 2 x 2 sterile gauze    Post-procedure details: Patient was observed during the procedure. Post-procedure instructions were reviewed.  Patient left the clinic in stable condition.

## 2018-09-02 NOTE — Progress Notes (Signed)
Danielle Irwin - 48 y.o. female MRN [id number]  Date of birth: 07-26-1970  Office Visit Note: Visit Date: 06/09/2018 PCP: Lucille Passy, MD Referred by: Lucille Passy, MD  Subjective: Chief Complaint  Patient presents with  . Lower Back - Pain  . Left Leg - Pain  . Left Foot - Numbness   HPI:  Danielle Irwin is a 48 y.o. female who comes in today For repeat diagnostic medically therapeutic left S1 transforaminal epidural steroid injection.  Patient did get good relief with prior injection although pain did not completely go away.  She does have disc herniation with S1 symptoms.  ROS Otherwise per HPI.  Assessment & Plan: Visit Diagnoses:  1. Lumbar radiculopathy   2. Radiculopathy due to lumbar intervertebral disc disorder     Plan: No additional findings.   Meds & Orders:  Meds ordered this encounter  Medications  . methylPREDNISolone acetate (DEPO-MEDROL) injection 80 mg    Orders Placed This Encounter  Procedures  . XR C-ARM NO REPORT  . Epidural Steroid injection    Follow-up: Return if symptoms worsen or fail to improve.   Procedures: No procedures performed  S1 Lumbosacral Transforaminal Epidural Steroid Injection - Sub-Pedicular Approach with Fluoroscopic Guidance   Patient: Danielle Irwin      Date of Birth: 08-16-1970 MRN: [id number] PCP: Lucille Passy, MD      Visit Date: 06/09/2018   Universal Protocol:    Date/Time: 03/25/201:16 PM  Consent Given By: the patient  Position:  PRONE  Additional Comments: Vital signs were monitored before and after the procedure. Patient was prepped and draped in the usual sterile fashion. The correct patient, procedure, and site was verified.   Injection Procedure Details:  Procedure Site One Meds Administered:  Meds ordered this encounter  Medications  . methylPREDNISolone acetate (DEPO-MEDROL) injection 80 mg    Laterality: Left  Location/Site:  S1 Foramen   Needle size: 22 ga.  Needle type: Spinal   Needle Placement: Transforaminal  Findings:   -Comments: Excellent flow of contrast along the nerve and into the epidural space.  Procedure Details: After squaring off the sacral end-plate to get a true AP view, the C-arm was positioned so that the best possible view of the S1 foramen was visualized. The soft tissues overlying this structure were infiltrated with 2-3 ml. of 1% Lidocaine without Epinephrine.    The spinal needle was inserted toward the target using a "trajectory" view along the fluoroscope beam.  Under AP and lateral visualization, the needle was advanced so it did not puncture dura. Biplanar projections were used to confirm position. Aspiration was confirmed to be negative for CSF and/or blood. A 1-2 ml. volume of Isovue-250 was injected and flow of contrast was noted at each level. Radiographs were obtained for documentation purposes.   After attaining the desired flow of contrast documented above, a 0.5 to 1.0 ml test dose of 0.25% Marcaine was injected into each respective transforaminal space.  The patient was observed for 90 seconds post injection.  After no sensory deficits were reported, and normal lower extremity motor function was noted,   the above injectate was administered so that equal amounts of the injectate were placed at each foramen (level) into the transforaminal epidural space.   Additional Comments:  The patient tolerated the procedure well Dressing: Band-Aid with 2 x 2 sterile gauze    Post-procedure details: Patient was observed during the procedure. Post-procedure instructions were reviewed.  Patient left  the clinic in stable condition.    Clinical History: MRI LUMBAR SPINE WITHOUT AND WITH CONTRAST  TECHNIQUE: Multiplanar and multiecho pulse sequences of the lumbar spine were obtained without and with intravenous contrast.  CONTRAST:  45mL MULTIHANCE GADOBENATE DIMEGLUMINE 529 MG/ML IV SOLN  COMPARISON:  Lumbar spine radiographs January 12, 2018  FINDINGS: SEGMENTATION: For the purposes of this report, the last well-formed intervertebral disc is reported as L5-S1.  ALIGNMENT: Maintained lumbar lordosis. No malalignment.  VERTEBRAE: Vertebral bodies are intact. Mild L3-4, moderate L4-5 and L5-S1 disc height loss with mild disc desiccation L3-4 through L5-S1. Mild chronic discogenic endplate changes lower lumbar spine without acute or abnormal bone marrow signal. No abnormal osseous or disc enhancement. No abnormal bone marrow signal. No abnormal osseous or intradiscal enhancement.  CONUS MEDULLARIS AND CAUDA EQUINA: Conus medullaris terminates at T12-L1 and demonstrates normal morphology and signal characteristics. Cauda equina is normal. No abnormal cord, leptomeningeal or epidural enhancement.  PARASPINAL AND OTHER SOFT TISSUES: Nonacute. RIGHT paraspinal muscle scarring L4-5.  DISC LEVELS:  T12-L1 through L2-3: No disc bulge, canal stenosis nor neural foraminal narrowing.  L3-4: Annular bulging. Mild facet arthropathy and ligamentum flavum redundancy without canal stenosis or neural foraminal narrowing.  L4-5: RIGHT hemilaminectomy. 3 mm broad-based disc bulge with enhancing annular fissure, possibly postoperative. No canal stenosis. Mild RIGHT greater than LEFT neural foraminal narrowing.  L5-S1: 5 mm LEFT central disc protrusion posteriorly displacing the traversing LEFT S1 nerve which is enlarged, edematous and enhancing. Mild facet arthropathy without canal stenosis. Mild LEFT neural foraminal narrowing.  IMPRESSION: 1. 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 2. Status post RIGHT L4-5 hemilaminectomy with small disc protrusion and annular fissure. 3. No canal stenosis. Mild L4-5 and L5-S1 neural foraminal narrowing.   Electronically Signed   By: Elon Alas M.D.   On: 02/21/2018 23:45     Objective:  VS:  HT:    WT:   BMI:     BP:(!)  145/83  HR:80bpm  TEMP:97.7 F (36.5 C)(Oral)  RESP:  Physical Exam  Ortho Exam Imaging: No results found.

## 2018-09-09 NOTE — Telephone Encounter (Signed)
TA has never filled out forms for this patient that I can tell/I have put them in her basket and will inform pt/thx dmf

## 2018-09-11 NOTE — Telephone Encounter (Signed)
Sent patient a MyChart message stating that Dr. [name] has never filled out forms for her and we would need a Webex appointment/thx dmf

## 2018-09-27 NOTE — Progress Notes (Signed)
Virtual Visit via Video   I connected with Danielle Irwin on 09/28/18 at  2:20 PM EDT by a video enabled telemedicine application and verified that I am speaking with the correct person using two identifiers. Location patient: Home Location provider: [facility], Office Persons participating in the virtual visit: Laureen M Masri, Arnette Norris, MD   I discussed the limitations of evaluation and management by telemedicine and the availability of in person appointments. The patient expressed understanding and agreed to proceed.  Subjective:   HPI:   Per pt:  "Fighting flu like symptoms; work sent me home for 7 days minimum, and I have to see a doctor and be released for work before I am allowed to go back. I will also need some paperwork filled out and faxed in. I have NOT had a fever higher than 99.3 so far, but I am having to use my inhaler much morefrequently, as breathing is difficult at times. Coughing comes and goes, but is non productive; fever, chills, and aches. Very tired."  Symptoms started on 09/21/18 ( 7 days ago) with cough, body aches, chills, fever, sore throat, headaches and fatigue.  Was out of work last Monday- Thursday.  Tried to go back to work on Friday and was sent home and advised she would need forms filled out for work.   She does have a history of asthma.  Fever has not been as bad last day or so.  Still having coughing, wheezing. Having to use having to use her rescue inhaler several times per day for the past two weeks.  ROS: See pertinent positives and negatives per HPI.  Review of Systems  Constitutional: Positive for fatigue and fever. Negative for activity change, appetite change, chills, diaphoresis and unexpected weight change.  Respiratory: Positive for cough, chest tightness, shortness of breath and wheezing. Negative for apnea, choking and stridor.   Cardiovascular: Negative.   All other systems reviewed and are negative.   Patient Active Problem  List   Diagnosis Date Noted  . Influenza-like illness 09/28/2018  . Bronchitis, acute 05/26/2018  . Fibromyalgia 05/12/2018  . Edema 05/12/2018  . BMI 60.0-69.9, adult ([place]) 05/12/2018  . DDD (degenerative disc disease), thoracolumbar 01/06/2018  . DVT (deep venous thrombosis) ([place]) 08/29/2017  . Leukocytosis 08/06/2017  . Humerus fracture 08/01/2017  . CPAP (continuous positive airway pressure) dependence 08/01/2017  . Asthma 08/01/2017  . HTN (hypertension) 06/24/2017  . Depression 11/07/2015  . Systolic murmur 85/07/7739  . Morbid obesity ([place]) 10/26/2014  . Insomnia 06/21/2014  . Anxiety 06/21/2014  . Spinal stenosis at L4-L5 level 06/02/2014  . POLYCYSTIC OVARIAN DISEASE 06/19/2010    Social History   Tobacco Use  . Smoking status: Former Smoker    Last attempt to quit: 05/26/1990    Years since quitting: 28.3  . Smokeless tobacco: Never Used  Substance Use Topics  . Alcohol use: No    Alcohol/week: 0.0 standard drinks    Current Outpatient Medications:  .  Alum & Mag Hydroxide-Simeth (ANTACID ANTI-GAS PO), Take 1 tablet by mouth daily as needed (for gas)., Disp: , Rfl:  .  Ascorbic Acid (VITAMIN C) 1000 MG tablet, Take 1,000 mg by mouth 2 (two) times daily., Disp: , Rfl:  .  aspirin EC 81 MG tablet, Take 81 mg by mouth daily., Disp: , Rfl:  .  aspirin-acetaminophen-caffeine (EXCEDRIN MIGRAINE) 250-250-65 MG tablet, Take 2 tablets by mouth 2 (two) times daily as needed for headache or migraine., Disp: , Rfl:  .  b complex vitamins tablet, Take 1 tablet by mouth daily., Disp: , Rfl:  .  bismuth subsalicylate (PEPTO-BISMOL) 262 MG/15ML suspension, Take 30 mLs by mouth 4 (four) times daily -  before meals and at bedtime., Disp: 360 mL, Rfl: 0 .  Calcium Carbonate-Vitamin D (CALCIUM-D PO), Take 1,200 mg by mouth 2 (two) times daily., Disp: , Rfl:  .  Cholecalciferol (VITAMIN D-3) 5000 units TABS, Take 5,000 Units by mouth 2 (two) times daily., Disp: , Rfl:  .   diphenhydrAMINE (BENADRYL) 25 MG tablet, Take 25-50 mg by mouth every 6 (six) hours as needed for itching, allergies or sleep., Disp: , Rfl:  .  docusate sodium (COLACE) 100 MG capsule, Take 1 capsule (100 mg total) by mouth 2 (two) times daily as needed for mild constipation., Disp: 20 capsule, Rfl: 1 .  ECHINACEA-GOLDEN SEAL PO, Take 1 tablet by mouth 2 (two) times daily as needed (immune system boost). , Disp: , Rfl:  .  hydrochlorothiazide (HYDRODIURIL) 25 MG tablet, TAKE 1 TABLET BY MOUTH ONCE DAILY, Disp: 90 tablet, Rfl: 1 .  lisinopril (PRINIVIL,ZESTRIL) 10 MG tablet, TAKE 1 TABLET BY MOUTH ONCE DAILY, Disp: 90 tablet, Rfl: 1 .  Magnesium 500 MG TABS, Take 500 mg by mouth at bedtime., Disp: , Rfl:  .  Melatonin 10 MG TABS, Take 1 tablet by mouth at bedtime. , Disp: , Rfl:  .  Multiple Vitamin (MULTIVITAMIN WITH MINERALS) TABS tablet, Take 1 tablet by mouth daily., Disp: , Rfl:  .  Multiple Vitamins-Minerals (ZINC PO), Take 500 mg by mouth daily as needed (immune system boost)., Disp: , Rfl:  .  mupirocin ointment (BACTROBAN) 2 %, Apply 1 application topically 2 (two) times daily. To affected area, Disp: 30 g, Rfl: 1 .  ondansetron (ZOFRAN) 4 MG tablet, Take 1-2 tablets (4-8 mg total) by mouth every 8 (eight) hours as needed for nausea or vomiting., Disp: 20 tablet, Rfl: 0 .  pregabalin (LYRICA) 75 MG capsule, Take 1 capsule (75 mg total) by mouth 2 (two) times daily., Disp: 60 capsule, Rfl: 3 .  senna-docusate (SENOKOT-S) 8.6-50 MG tablet, Take 1 tablet by mouth at bedtime as needed for mild constipation., Disp: 15 tablet, Rfl: 0 .  sertraline (ZOLOFT) 100 MG tablet, Take 1.5 tablets (150 mg total) by mouth daily., Disp: 45 tablet, Rfl: 3 .  silver sulfADIAZINE (SILVADENE) 1 % cream, Apply 1 application topically 2 (two) times daily. (Patient taking differently: Apply 1 application topically 2 (two) times daily as needed (wound care). ), Disp: 50 g, Rfl: 0 .  St Johns Wort 300 MG CAPS, Take 900  mg by mouth 2 (two) times daily. , Disp: , Rfl:  .  VENTOLIN HFA 108 (90 Base) MCG/ACT inhaler, INHALE 2 PUFFS BY MOUTH EVERY 6 HOURS AS NEEDED, Disp: 18 each, Rfl: 1 .  HYDROcodone-homatropine (HYCODAN) 5-1.5 MG/5ML syrup, Take 5 mLs by mouth every 8 (eight) hours as needed for cough., Disp: 120 mL, Rfl: 0  Allergies  Allergen Reactions  . Benzalkonium Chloride Anaphylaxis and Hives  . Neosporin [Neomycin-Bacitracin Zn-Polymyx] Anaphylaxis and Hives  . Polysporin [Bacitracin-Polymyxin B] Hives  . Bee Venom Hives and Swelling    Throat swelling  . Bupropion Hcl Other (See Comments)    angry  . Cephalexin Hives and Swelling  . Latex     Objective:  Temp 99.2 F (37.3 C) (Oral)   VITALS: Per patient if applicable, see vitals. GENERAL: Alert, appears well and in no acute distress. HEENT: Atraumatic,  conjunctiva clear, no obvious abnormalities on inspection of external nose and ears. NECK: Normal movements of the head and neck. CARDIOPULMONARY: No increased WOB. Speaking in clear sentences. I:E ratio WNL.  MS: Moves all visible extremities without noticeable abnormality. PSYCH: Pleasant and cooperative, well-groomed. Speech normal rate and rhythm. Affect is appropriate. Insight and judgement are appropriate. Attention is focused, linear, and appropriate.  NEURO: CN grossly intact. Oriented as arrived to appointment on time with no prompting. Moves both UE equally.  SKIN: No obvious lesions, wounds, erythema, or cyanosis noted on face or hands.  Assessment and Plan:   Lucero was seen today for cough.  Diagnoses and all orders for this visit:  Moderate persistent asthma with acute exacerbation  Influenza-like illness  Other orders -     HYDROcodone-homatropine (HYCODAN) 5-1.5 MG/5ML syrup; Take 5 mLs by mouth every 8 (eight) hours as needed for cough.    . Reviewed expectations re: course of current medical issues. . Discussed self-management of symptoms. . Outlined signs and  symptoms indicating need for more acute intervention. . Patient verbalized understanding and all questions were answered. Marland Kitchen Health Maintenance issues including appropriate healthy diet, exercise, and smoking avoidance were discussed with patient. . See orders for this visit as documented in the electronic medical record.  Arnette Norris, MD 09/28/2018

## 2018-09-28 ENCOUNTER — Encounter: Payer: Self-pay | Admitting: Family Medicine

## 2018-09-28 ENCOUNTER — Ambulatory Visit (INDEPENDENT_AMBULATORY_CARE_PROVIDER_SITE_OTHER): Payer: BLUE CROSS/BLUE SHIELD | Admitting: Family Medicine

## 2018-09-28 VITALS — Temp 99.2°F

## 2018-09-28 DIAGNOSIS — J4541 Moderate persistent asthma with (acute) exacerbation: Secondary | ICD-10-CM

## 2018-09-28 DIAGNOSIS — J111 Influenza due to unidentified influenza virus with other respiratory manifestations: Secondary | ICD-10-CM

## 2018-09-28 DIAGNOSIS — R69 Illness, unspecified: Secondary | ICD-10-CM | POA: Diagnosis not present

## 2018-09-28 MED ORDER — HYDROCODONE-HOMATROPINE 5-1.5 MG/5ML PO SYRP: 5.0000 mL | ORAL_SOLUTION | Freq: Three times a day (TID) | ORAL | 0 refills | Status: DC | PRN

## 2018-09-28 NOTE — Assessment & Plan Note (Addendum)
New- ILI vs COVID 19 in an asthmatic. She absolutely should stay home- see communications.  Letter written for her employer. Call or send my chart message prn if these symptoms worsen or fail to improve as anticipated. Hycodan rx sent in for cough- discussed sedation precautions. The patient indicates understanding of these issues and agrees with the plan. >25 minutes spent in face to face time with patient, >50% spent in counselling or coordination of care

## 2018-10-07 ENCOUNTER — Encounter: Payer: Self-pay | Admitting: Family Medicine

## 2018-10-08 ENCOUNTER — Ambulatory Visit (INDEPENDENT_AMBULATORY_CARE_PROVIDER_SITE_OTHER): Payer: BLUE CROSS/BLUE SHIELD | Admitting: Family Medicine

## 2018-10-08 ENCOUNTER — Encounter: Payer: Self-pay | Admitting: Family Medicine

## 2018-10-08 VITALS — Temp 99.3°F

## 2018-10-08 DIAGNOSIS — Z20822 Contact with and (suspected) exposure to covid-19: Secondary | ICD-10-CM | POA: Insufficient documentation

## 2018-10-08 DIAGNOSIS — R6889 Other general symptoms and signs: Secondary | ICD-10-CM

## 2018-10-08 DIAGNOSIS — J209 Acute bronchitis, unspecified: Secondary | ICD-10-CM

## 2018-10-08 DIAGNOSIS — R69 Illness, unspecified: Secondary | ICD-10-CM | POA: Diagnosis not present

## 2018-10-08 DIAGNOSIS — J111 Influenza due to unidentified influenza virus with other respiratory manifestations: Secondary | ICD-10-CM

## 2018-10-08 MED ORDER — DOXYCYCLINE HYCLATE 100 MG PO TABS: 100.0000 mg | ORAL_TABLET | Freq: Two times a day (BID) | ORAL | 0 refills | Status: DC

## 2018-10-08 MED ORDER — HYDROCODONE-HOMATROPINE 5-1.5 MG/5ML PO SYRP: 5.0000 mL | ORAL_SOLUTION | Freq: Three times a day (TID) | ORAL | 0 refills | Status: DC | PRN

## 2018-10-08 NOTE — Assessment & Plan Note (Signed)
Now with calssic symptoms of a sinus infection. Great with doxycline 100 mg twice daily x 10 days, hycodan as needed for severe cough,   She is aware of sedation precautions. Call or send my chart message prn if these symptoms worsen or fail to improve as anticipated..The patient indicates understanding of these issues and agrees with the plan.

## 2018-10-08 NOTE — Progress Notes (Signed)
Virtual Visit via Video   I connected with Danielle Irwin on 10/08/18 at 11:40 AM EDT by a video enabled telemedicine application and verified that I am speaking with the correct person using two identifiers. Location patient: Home Location provider: [facility], Office Persons participating in the virtual visit: Ahonesty M Tandon, Arnette Norris, MD   I discussed the limitations of evaluation and management by telemedicine and the availability of in person appointments. The patient expressed understanding and agreed to proceed.  Subjective:   HPI:  Last saw her for this complaint on 09/28/18.  Not reivewed-   "Fighting flu like symptoms; work sent me home for 7 days minimum, and I have to see a doctor and be released for work before I am allowed to go back. I will also need some paperwork filled out and faxed in. I have NOT had a fever higher than 99.3 so far, but I am having to use my inhaler much morefrequently, as breathing is difficult at times. Coughing comes and goes, but is non productive; fever, chills, and aches. Very tired."  Symptoms had started on 09/21/18 ( 7  Days prior) with cough, body aches, chills, fever, sore throat, headaches and fatigue.  Was out of work last Monday- Thursday.  Tried to go back to work on Friday and was sent home and advised she would need forms filled out for work.   She does have a history of asthma.  Fever has not been as bad last day or so.  Still having coughing, wheezing. Having to use having to use her rescue inhaler several times per day for the past two weeks.  Coughing is not infrequent.  ROS: See pertinent positives and negatives per HPI.  Review of Systems  Constitutional: Positive for fatigue and fever. Negative for activity change, appetite change, chills, diaphoresis and unexpected weight change.  HENT: Positive for sinus pressure and sinus pain.   Respiratory: Positive for cough, chest tightness, shortness of breath and wheezing.  Negative for apnea, choking and stridor.   Cardiovascular: Negative.   All other systems reviewed and are negative.  My Assessment and plan from that OV was as follows:  New- ILI vs COVID 19 in an asthmatic. She absolutely should stay home- see communications.  Letter written for her employer. Call or send my chart message prn if these symptoms worsen or fail to improve as anticipated. Hycodan rx sent in for cough- discussed sedation precautions. The patient indicates understanding of these issues and agrees with the plan. >25 minutes spent in face to face time with patient, >50% spent in counselling or coordination of care  She wrote yesterday, stating the following:  Hi Dr [name],   I'm still not feeling any better. My temp still hasn't been over 99.3, but the headaches, chills, coughing, sore throat, etc is all still around. My sinus area hurts, and the mucus is sometimes thick and yellow. I am having some equilibrium issues; if I roll over, sit, or stand too quickly the room spins for 20-30 seconds. Could it be a sinus infection? Do I need to go somewhere to be tested for the Covid mess? So far work is being ok with my being out, but my paychecks will begin to be affected starting next payday, so I really need to get well and get back to work.   Patient Active Problem List   Diagnosis Date Noted  . Influenza-like illness 09/28/2018  . Bronchitis, acute 05/26/2018  . Fibromyalgia 05/12/2018  .  Edema 05/12/2018  . BMI 60.0-69.9, adult ([place]) 05/12/2018  . DDD (degenerative disc disease), thoracolumbar 01/06/2018  . DVT (deep venous thrombosis) ([place]) 08/29/2017  . Leukocytosis 08/06/2017  . Humerus fracture 08/01/2017  . CPAP (continuous positive airway pressure) dependence 08/01/2017  . Asthma 08/01/2017  . HTN (hypertension) 06/24/2017  . Depression 11/07/2015  . Systolic murmur 58/52/7782  . Morbid obesity ([place]) 10/26/2014  . Insomnia 06/21/2014  . Anxiety 06/21/2014  . Spinal  stenosis at L4-L5 level 06/02/2014  . POLYCYSTIC OVARIAN DISEASE 06/19/2010    Social History   Tobacco Use  . Smoking status: Former Smoker    Last attempt to quit: 05/26/1990    Years since quitting: 28.3  . Smokeless tobacco: Never Used  Substance Use Topics  . Alcohol use: No    Alcohol/week: 0.0 standard drinks    Current Outpatient Medications:  .  Alum & Mag Hydroxide-Simeth (ANTACID ANTI-GAS PO), Take 1 tablet by mouth daily as needed (for gas)., Disp: , Rfl:  .  Ascorbic Acid (VITAMIN C) 1000 MG tablet, Take 1,000 mg by mouth 2 (two) times daily., Disp: , Rfl:  .  aspirin EC 81 MG tablet, Take 81 mg by mouth daily., Disp: , Rfl:  .  aspirin-acetaminophen-caffeine (EXCEDRIN MIGRAINE) 250-250-65 MG tablet, Take 2 tablets by mouth 2 (two) times daily as needed for headache or migraine., Disp: , Rfl:  .  b complex vitamins tablet, Take 1 tablet by mouth daily., Disp: , Rfl:  .  bismuth subsalicylate (PEPTO-BISMOL) 262 MG/15ML suspension, Take 30 mLs by mouth 4 (four) times daily -  before meals and at bedtime., Disp: 360 mL, Rfl: 0 .  Calcium Carbonate-Vitamin D (CALCIUM-D PO), Take 1,200 mg by mouth 2 (two) times daily., Disp: , Rfl:  .  Cholecalciferol (VITAMIN D-3) 5000 units TABS, Take 5,000 Units by mouth 2 (two) times daily., Disp: , Rfl:  .  diphenhydrAMINE (BENADRYL) 25 MG tablet, Take 25-50 mg by mouth every 6 (six) hours as needed for itching, allergies or sleep., Disp: , Rfl:  .  docusate sodium (COLACE) 100 MG capsule, Take 1 capsule (100 mg total) by mouth 2 (two) times daily as needed for mild constipation., Disp: 20 capsule, Rfl: 1 .  ECHINACEA-GOLDEN SEAL PO, Take 1 tablet by mouth 2 (two) times daily as needed (immune system boost). , Disp: , Rfl:  .  hydrochlorothiazide (HYDRODIURIL) 25 MG tablet, TAKE 1 TABLET BY MOUTH ONCE DAILY, Disp: 90 tablet, Rfl: 1 .  lisinopril (PRINIVIL,ZESTRIL) 10 MG tablet, TAKE 1 TABLET BY MOUTH ONCE DAILY, Disp: 90 tablet, Rfl: 1 .   Magnesium 500 MG TABS, Take 500 mg by mouth at bedtime., Disp: , Rfl:  .  Melatonin 10 MG TABS, Take 1 tablet by mouth at bedtime. , Disp: , Rfl:  .  Multiple Vitamin (MULTIVITAMIN WITH MINERALS) TABS tablet, Take 1 tablet by mouth daily., Disp: , Rfl:  .  Multiple Vitamins-Minerals (ZINC PO), Take 500 mg by mouth daily as needed (immune system boost)., Disp: , Rfl:  .  mupirocin ointment (BACTROBAN) 2 %, Apply 1 application topically 2 (two) times daily. To affected area, Disp: 30 g, Rfl: 1 .  ondansetron (ZOFRAN) 4 MG tablet, Take 1-2 tablets (4-8 mg total) by mouth every 8 (eight) hours as needed for nausea or vomiting., Disp: 20 tablet, Rfl: 0 .  pregabalin (LYRICA) 75 MG capsule, Take 1 capsule (75 mg total) by mouth 2 (two) times daily., Disp: 60 capsule, Rfl: 3 .  senna-docusate (SENOKOT-S)  8.6-50 MG tablet, Take 1 tablet by mouth at bedtime as needed for mild constipation., Disp: 15 tablet, Rfl: 0 .  sertraline (ZOLOFT) 100 MG tablet, Take 1.5 tablets (150 mg total) by mouth daily., Disp: 45 tablet, Rfl: 3 .  silver sulfADIAZINE (SILVADENE) 1 % cream, Apply 1 application topically 2 (two) times daily. (Patient taking differently: Apply 1 application topically 2 (two) times daily as needed (wound care). ), Disp: 50 g, Rfl: 0 .  St Johns Wort 300 MG CAPS, Take 900 mg by mouth 2 (two) times daily. , Disp: , Rfl:  .  VENTOLIN HFA 108 (90 Base) MCG/ACT inhaler, INHALE 2 PUFFS BY MOUTH EVERY 6 HOURS AS NEEDED, Disp: 18 each, Rfl: 1 .  HYDROcodone-homatropine (HYCODAN) 5-1.5 MG/5ML syrup, Take 5 mLs by mouth every 8 (eight) hours as needed for cough., Disp: 120 mL, Rfl: 0  Allergies  Allergen Reactions  . Benzalkonium Chloride Anaphylaxis and Hives  . Neosporin [Neomycin-Bacitracin Zn-Polymyx] Anaphylaxis and Hives  . Polysporin [Bacitracin-Polymyxin B] Hives  . Bee Venom Hives and Swelling    Throat swelling  . Bupropion Hcl Other (See Comments)    angry  . Cephalexin Hives and Swelling  .  Latex     Objective:  Temp 99.3 F (37.4 C) (Oral)   VITALS: Per patient if applicable, see vitals. GENERAL: Alert, appears well and in no acute distress. HEENT: Atraumatic, conjunctiva clear, no obvious abnormalities on inspection of external nose and ears. NECK: Normal movements of the head and neck. CARDIOPULMONARY: No increased WOB. Speaking in clear sentences. I:E ratio WNL.  MS: Moves all visible extremities without noticeable abnormality. PSYCH: Pleasant and cooperative, well-groomed. Speech normal rate and rhythm. Affect is appropriate. Insight and judgement are appropriate. Attention is focused, linear, and appropriate.  NEURO: CN grossly intact. Oriented as arrived to appointment on time with no prompting. Moves both UE equally.  SKIN: No obvious lesions, wounds, erythema, or cyanosis noted on face or hands.  Assessment and Plan:   There are no diagnoses linked to this encounter.  . Reviewed expectations re: course of current medical issues. . Discussed self-management of symptoms. . Outlined signs and symptoms indicating need for more acute intervention. . Patient verbalized understanding and all questions were answered. Marland Kitchen Health Maintenance issues including appropriate healthy diet, exercise, and smoking avoidance were discussed with patient. . See orders for this visit as documented in the electronic medical record.  Arnette Norris, MD 10/08/2018

## 2018-10-09 ENCOUNTER — Other Ambulatory Visit: Payer: Self-pay

## 2018-10-09 ENCOUNTER — Encounter: Payer: Self-pay | Admitting: Family Medicine

## 2018-10-09 MED ORDER — DOXYCYCLINE HYCLATE 100 MG PO TABS: 100.0000 mg | ORAL_TABLET | Freq: Two times a day (BID) | ORAL | 0 refills | Status: AC

## 2018-10-10 ENCOUNTER — Encounter: Payer: Self-pay | Admitting: Family Medicine

## 2018-10-18 IMAGING — MR MR LUMBAR SPINE WO/W CM
4 of 7 series · 24 of 48 positions shown · IV contrast (multihance)
Comparison: Lumbar spine radiographs January 12, 2018

CLINICAL DATA: Severe low back pain radiating to LEFT lower
extremity with numbness for 6 weeks. History of RIGHT L4-5 micro
discectomy 9236.

EXAM:
MRI LUMBAR SPINE WITHOUT AND WITH CONTRAST
TECHNIQUE: Multiplanar and multiecho pulse sequences of the lumbar spine were
obtained without and with intravenous contrast.
CONTRAST:  20mL MULTIHANCE GADOBENATE DIMEGLUMINE 529 MG/ML IV SOLN

[Series 3: T1 · sagittal · 4.0mm · 0.55mm/px · 5 of 15 slices shown (1 of 2)]
[im 1/15]
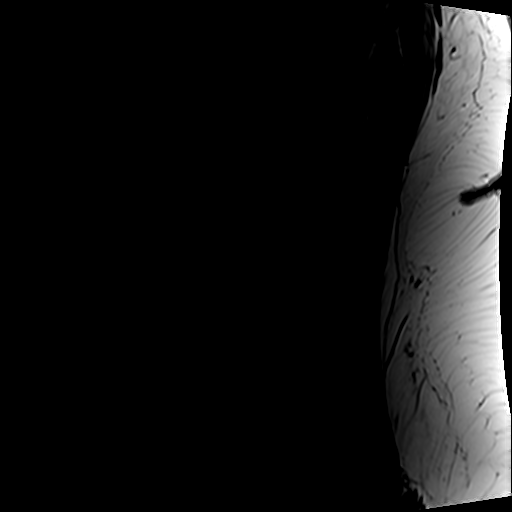
[im 4/15]
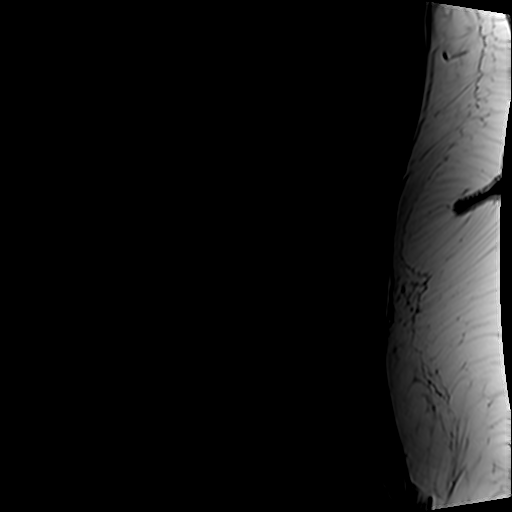
[im 8/15]
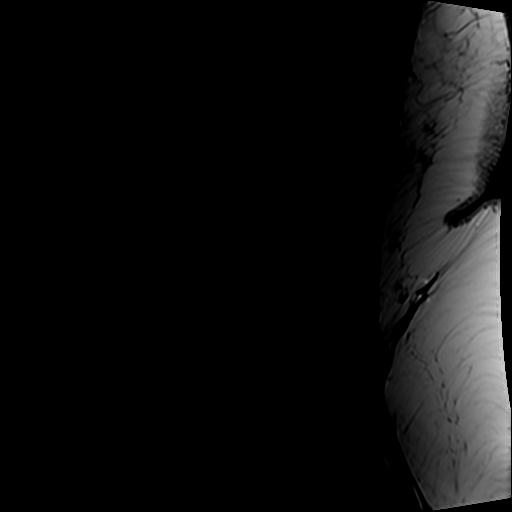
[im 11/15]
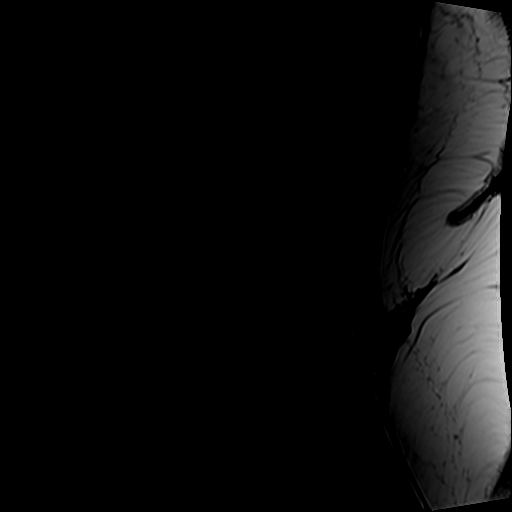
[im 15/15]
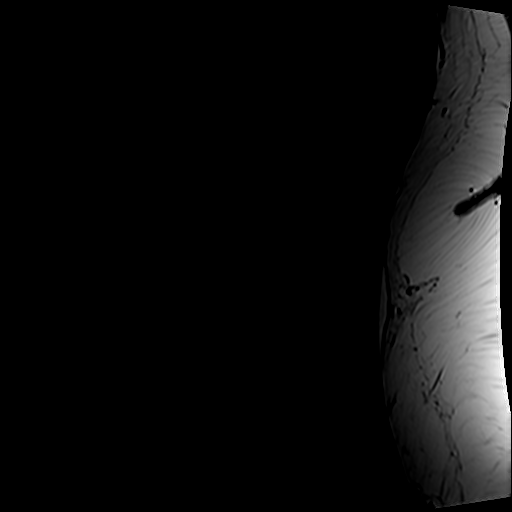

[Series 5: T2 · axial · 4.0mm · 0.70mm/px · z∈[-118,+94]mm · 8 of 36 slices shown (1 of 2)]
[im 1/36]
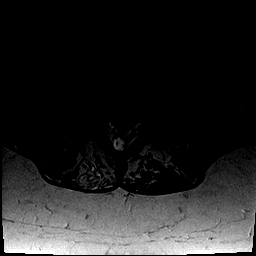
[im 4/36]
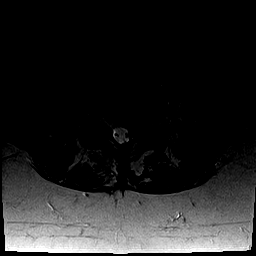
[im 12/36]
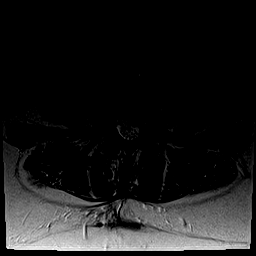
[im 16/36]
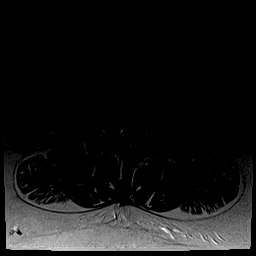
[im 20/36]
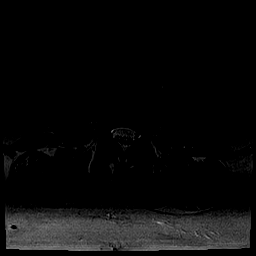
[im 24/36]
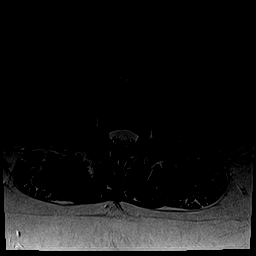
[im 32/36]
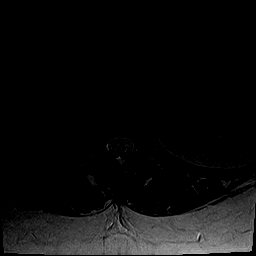
[im 36/36]
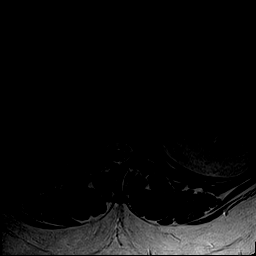

[Series 6: T1 · axial · 4.0mm · 0.35mm/px · z∈[-118,+73]mm · 7 of 36 slices shown (2 of 2)]
[im 1/36]
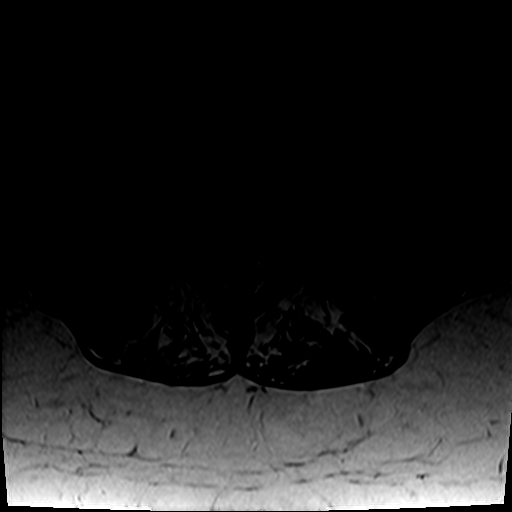
[im 4/36]
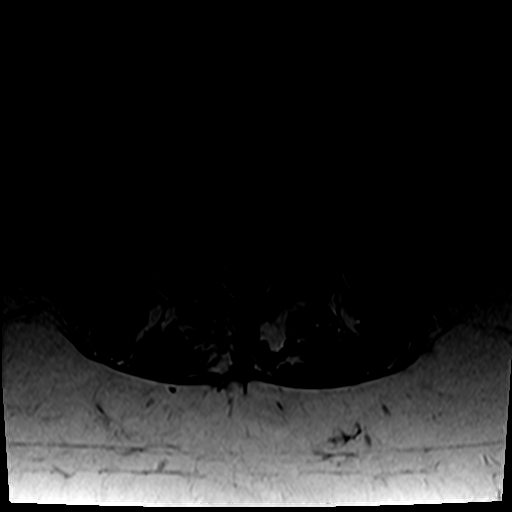
[im 12/36]
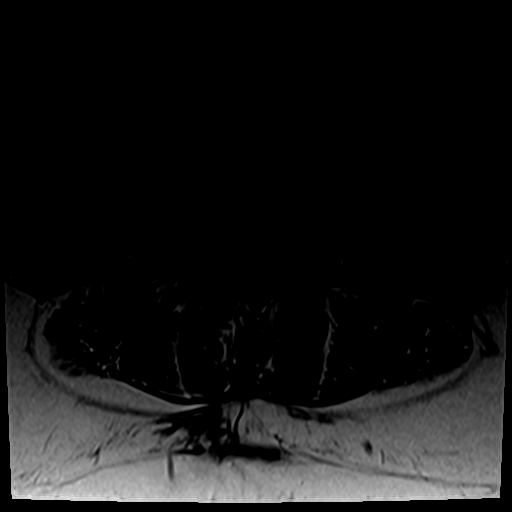
[im 16/36]
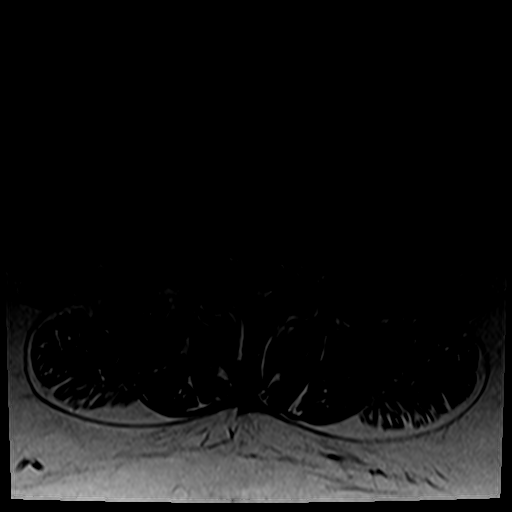
[im 20/36]
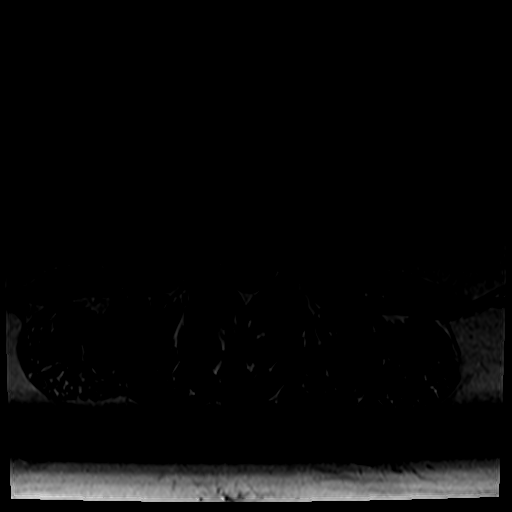
[im 24/36]
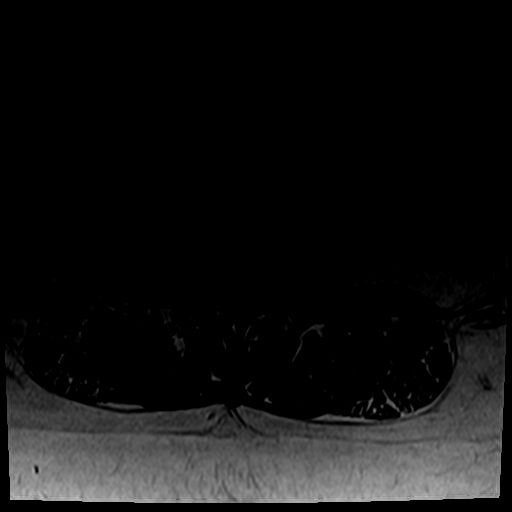
[im 32/36]
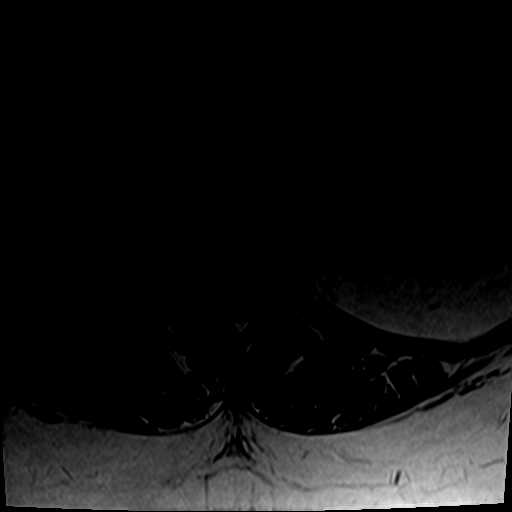

[Series 7: T2 · sagittal · 4.0mm · 0.55mm/px · 4 of 15 slices shown (2 of 2)]
[im 1/15]
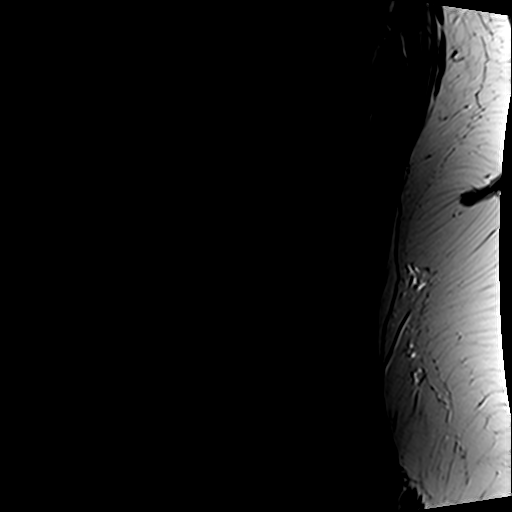
[im 5/15]
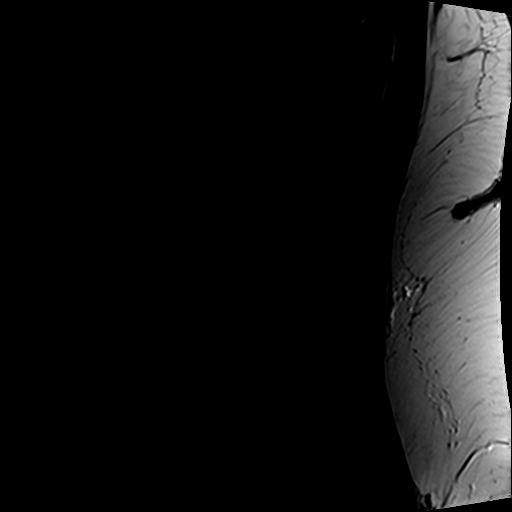
[im 10/15]
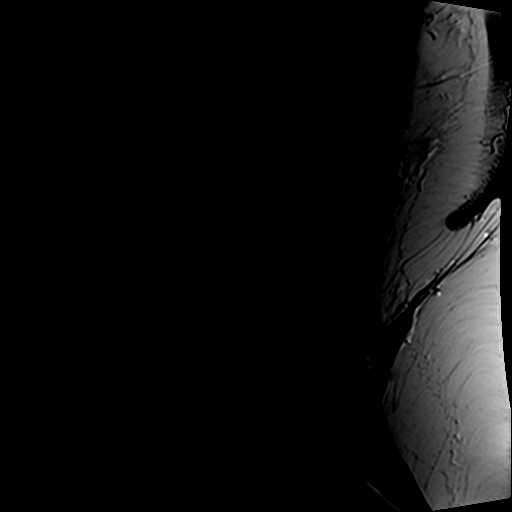
[im 15/15]
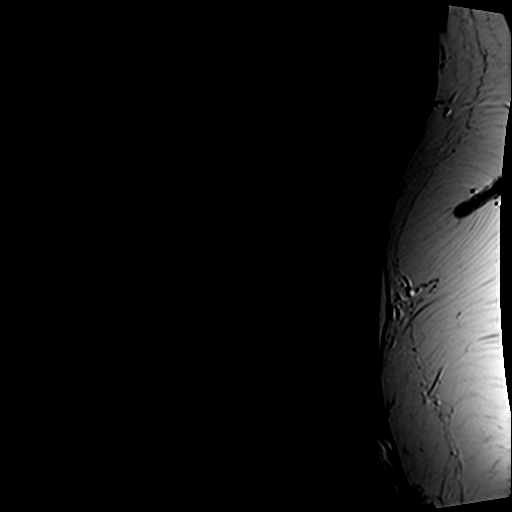

[24 of 48 positions shown; findings below may reference images not displayed]

FINDINGS: SEGMENTATION: For the purposes of this report, the last well-formed
intervertebral disc is reported as L5-S1.

ALIGNMENT: Maintained lumbar lordosis. No malalignment.

VERTEBRAE: Vertebral bodies are intact. Mild L3-4, moderate L4-5 and
L5-S1 disc height loss with mild disc desiccation L3-4 through
L5-S1. Mild chronic discogenic endplate changes lower lumbar spine
without acute or abnormal bone marrow signal. No abnormal osseous or
disc enhancement. No abnormal bone marrow signal. No abnormal
osseous or intradiscal enhancement.

CONUS MEDULLARIS AND CAUDA EQUINA: Conus medullaris terminates at
T12-L1 and demonstrates normal morphology and signal
characteristics. Cauda equina is normal. No abnormal cord,
leptomeningeal or epidural enhancement.

PARASPINAL AND OTHER SOFT TISSUES: Nonacute. RIGHT paraspinal muscle
scarring L4-5.

DISC LEVELS:

T12-L1 through L2-3: No disc bulge, canal stenosis nor neural
foraminal narrowing.

L3-4: Annular bulging. Mild facet arthropathy and ligamentum flavum
redundancy without canal stenosis or neural foraminal narrowing.

L4-5: RIGHT hemilaminectomy. 3 mm broad-based disc bulge with
enhancing annular fissure, possibly postoperative. No canal
stenosis. Mild RIGHT greater than LEFT neural foraminal narrowing.

L5-S1: 5 mm LEFT central disc protrusion posteriorly displacing the
traversing LEFT S1 nerve which is enlarged, edematous and enhancing.
Mild facet arthropathy without canal stenosis. Mild LEFT neural
foraminal narrowing.
IMPRESSION: 1. L5-S1 disc protrusion resulting in LEFT S1 nerve impingement and
neuritis.
2. Status post RIGHT L4-5 hemilaminectomy with small disc protrusion
and annular fissure.
3. No canal stenosis. Mild L4-5 and L5-S1 neural foraminal
narrowing.

## 2018-10-23 ENCOUNTER — Encounter: Payer: Self-pay | Admitting: Family Medicine

## 2018-10-26 ENCOUNTER — Encounter: Payer: Self-pay | Admitting: Family Medicine

## 2018-10-26 ENCOUNTER — Telehealth: Payer: Self-pay | Admitting: *Deleted

## 2018-10-26 ENCOUNTER — Ambulatory Visit (INDEPENDENT_AMBULATORY_CARE_PROVIDER_SITE_OTHER): Payer: BLUE CROSS/BLUE SHIELD | Admitting: Family Medicine

## 2018-10-26 ENCOUNTER — Encounter (INDEPENDENT_AMBULATORY_CARE_PROVIDER_SITE_OTHER): Payer: Self-pay

## 2018-10-26 ENCOUNTER — Other Ambulatory Visit: Payer: Self-pay | Admitting: Family Medicine

## 2018-10-26 VITALS — Temp 98.7°F

## 2018-10-26 DIAGNOSIS — J4541 Moderate persistent asthma with (acute) exacerbation: Secondary | ICD-10-CM | POA: Diagnosis not present

## 2018-10-26 DIAGNOSIS — R509 Fever, unspecified: Secondary | ICD-10-CM | POA: Diagnosis not present

## 2018-10-26 DIAGNOSIS — J209 Acute bronchitis, unspecified: Secondary | ICD-10-CM | POA: Diagnosis not present

## 2018-10-26 DIAGNOSIS — J329 Chronic sinusitis, unspecified: Secondary | ICD-10-CM | POA: Diagnosis not present

## 2018-10-26 MED ORDER — LEVOFLOXACIN 500 MG PO TABS: 500.0000 mg | ORAL_TABLET | Freq: Every day | ORAL | 0 refills | Status: DC

## 2018-10-26 MED ORDER — PREDNISONE 10 MG PO TABS: ORAL_TABLET | ORAL | 0 refills | Status: DC

## 2018-10-26 NOTE — Telephone Encounter (Signed)
TA-Not really certain what any of this means in this message/do you think that she is suffering from that Cytokine Storm? Or does that look completely different? I don't know much about it/thx dmf

## 2018-10-26 NOTE — Telephone Encounter (Signed)
Received BPA for worsening cough via [facility]. Noted that patient was seen for a virtual visit with PCP and cough was addressed during the visit. MyChart message sent to pt to continue to monitor symptoms and follow care plan as advised by PCP during visit today.

## 2018-10-26 NOTE — Telephone Encounter (Signed)
Attempted to call pt.  No answer.  Left VM to call us immediately.

## 2018-10-26 NOTE — Assessment & Plan Note (Signed)
>  25 minutes spent in face to face time with patient, >50% spent in counselling or coordination of care discussing persistent/recurrent fever, cough, wheezing and malaise. Sinus pressure improved with doxycyline but other symptoms have not. ? Possible COVID. Multiple abx allergies. Will place on short prednisone burst to decrease inflammation in her lungs and send in course of levaquin to treat possible bronchitis/PNA. Also will send a letter to her employer about possible COVID diagnosis. Call or send my chart message prn if these symptoms worsen or fail to improve as anticipated. The patient indicates understanding of these issues and agrees with the plan.

## 2018-10-26 NOTE — Telephone Encounter (Signed)
Cough is worsening since we talked??  I would advise her to go to urgent care or ED as soon as possible.

## 2018-10-26 NOTE — Progress Notes (Signed)
Virtual Visit via Video   Due to the COVID-19 pandemic, this visit was completed with telemedicine (audio/video) technology to reduce patient and provider exposure as well as to preserve personal protective equipment.   I connected with Danielle Irwin by a video enabled telemedicine application and verified that I am speaking with the correct person using two identifiers. Location patient: Home Location provider: [facility], Office Persons participating in the virtual visit: Danielle Irwin, Arnette Norris, MD   I discussed the limitations of evaluation and management by telemedicine and the availability of in person appointments. The patient expressed understanding and agreed to proceed.  Care Team   Patient Care Team: Lucille Passy, MD as PCP - General (Family Medicine) Altamese [place], MD as Consulting Physician (Orthopedic Surgery) Altamese Fallon, MD as Consulting Physician (Orthopedic Surgery)  Subjective:   HPI:  Last saw patient for a virtual visit on 10/08/18.  Note reviewed.  At that time, she reported the following:  "Fighting flu like symptoms; work sent me home for 7 days minimum, and I have to see a doctor and be released for work before I am allowed to go back. I will also need some paperwork filled out and faxed in. I have NOT had a fever higher than 99.3 so far, but I am having to use my inhaler much morefrequently, as breathing is difficult at times. Coughing comes and goes, but is non productive; fever, chills, and aches. Very tired."  She was febrile and has a history of asthma.  Her most concerning symptoms at that time was sinus pressure so we treated her with doxycyline 100 mg twice daily x 10 days for bronchitis and sinusitis along with hycodan as needed for severe cough.  She felt her sinus pressure improved but the other symptoms have persisted intermittently. Today she reports that her fever increased to 101 on Wednesday then returned to normal. Then on Saturday  it went up to 99.7. She has nonproductive cough that "Is driving me nuts." Post-nasal drip. Sinus pressure has decreased a lot since she took the doxycycline. Feels very fatigued and aches all over. Using inhaler at least tid. Woke sweating this am and T: 98.7.   Review of Systems  Constitutional: Positive for diaphoresis, fever and malaise/fatigue.  HENT: Positive for congestion. Negative for sinus pain.   Eyes: Negative.   Respiratory: Positive for cough and wheezing. Negative for hemoptysis.   Gastrointestinal: Negative.   Genitourinary: Negative.   Musculoskeletal: Negative.   Skin: Negative.   Neurological: Negative.   Endo/Heme/Allergies: Negative.   Psychiatric/Behavioral: Negative.   All other systems reviewed and are negative.    Patient Active Problem List   Diagnosis Date Noted  . Sinus infection 10/26/2018  . Suspected Covid-19 Virus Infection 10/08/2018  . Influenza-like illness 09/28/2018  . Bronchitis, acute 05/26/2018  . Fibromyalgia 05/12/2018  . Edema 05/12/2018  . BMI 60.0-69.9, adult ([place]) 05/12/2018  . DDD (degenerative disc disease), thoracolumbar 01/06/2018  . DVT (deep venous thrombosis) ([place]) 08/29/2017  . Leukocytosis 08/06/2017  . Humerus fracture 08/01/2017  . CPAP (continuous positive airway pressure) dependence 08/01/2017  . Asthma 08/01/2017  . HTN (hypertension) 06/24/2017  . Depression 11/07/2015  . Systolic murmur 72/02/4708  . Morbid obesity ([place]) 10/26/2014  . Insomnia 06/21/2014  . Anxiety 06/21/2014  . Spinal stenosis at L4-L5 level 06/02/2014  . POLYCYSTIC OVARIAN DISEASE 06/19/2010    Social History   Tobacco Use  . Smoking status: Former Smoker    Last attempt  to quit: 05/26/1990    Years since quitting: 28.4  . Smokeless tobacco: Never Used  Substance Use Topics  . Alcohol use: No    Alcohol/week: 0.0 standard drinks    Current Outpatient Medications:  .  Alum & Mag Hydroxide-Simeth (ANTACID ANTI-GAS PO), Take 1 tablet  by mouth daily as needed (for gas)., Disp: , Rfl:  .  Ascorbic Acid (VITAMIN C) 1000 MG tablet, Take 1,000 mg by mouth 2 (two) times daily., Disp: , Rfl:  .  aspirin EC 81 MG tablet, Take 81 mg by mouth daily., Disp: , Rfl:  .  aspirin-acetaminophen-caffeine (EXCEDRIN MIGRAINE) 250-250-65 MG tablet, Take 2 tablets by mouth 2 (two) times daily as needed for headache or migraine., Disp: , Rfl:  .  b complex vitamins tablet, Take 1 tablet by mouth daily., Disp: , Rfl:  .  bismuth subsalicylate (PEPTO-BISMOL) 262 MG/15ML suspension, Take 30 mLs by mouth 4 (four) times daily -  before meals and at bedtime., Disp: 360 mL, Rfl: 0 .  Calcium Carbonate-Vitamin D (CALCIUM-D PO), Take 1,200 mg by mouth 2 (two) times daily., Disp: , Rfl:  .  Cholecalciferol (VITAMIN D-3) 5000 units TABS, Take 5,000 Units by mouth 2 (two) times daily., Disp: , Rfl:  .  diphenhydrAMINE (BENADRYL) 25 MG tablet, Take 25-50 mg by mouth every 6 (six) hours as needed for itching, allergies or sleep., Disp: , Rfl:  .  docusate sodium (COLACE) 100 MG capsule, Take 1 capsule (100 mg total) by mouth 2 (two) times daily as needed for mild constipation., Disp: 20 capsule, Rfl: 1 .  ECHINACEA-GOLDEN SEAL PO, Take 1 tablet by mouth 2 (two) times daily as needed (immune system boost). , Disp: , Rfl:  .  hydrochlorothiazide (HYDRODIURIL) 25 MG tablet, TAKE 1 TABLET BY MOUTH ONCE DAILY, Disp: 90 tablet, Rfl: 1 .  lisinopril (PRINIVIL,ZESTRIL) 10 MG tablet, TAKE 1 TABLET BY MOUTH ONCE DAILY, Disp: 90 tablet, Rfl: 1 .  Magnesium 500 MG TABS, Take 500 mg by mouth at bedtime., Disp: , Rfl:  .  Melatonin 10 MG TABS, Take 1 tablet by mouth at bedtime. , Disp: , Rfl:  .  Multiple Vitamin (MULTIVITAMIN WITH MINERALS) TABS tablet, Take 1 tablet by mouth daily., Disp: , Rfl:  .  Multiple Vitamins-Minerals (ZINC PO), Take 500 mg by mouth daily as needed (immune system boost)., Disp: , Rfl:  .  mupirocin ointment (BACTROBAN) 2 %, Apply 1 application  topically 2 (two) times daily. To affected area, Disp: 30 g, Rfl: 1 .  ondansetron (ZOFRAN) 4 MG tablet, Take 1-2 tablets (4-8 mg total) by mouth every 8 (eight) hours as needed for nausea or vomiting., Disp: 20 tablet, Rfl: 0 .  pregabalin (LYRICA) 75 MG capsule, Take 1 capsule (75 mg total) by mouth 2 (two) times daily., Disp: 60 capsule, Rfl: 3 .  senna-docusate (SENOKOT-S) 8.6-50 MG tablet, Take 1 tablet by mouth at bedtime as needed for mild constipation., Disp: 15 tablet, Rfl: 0 .  sertraline (ZOLOFT) 100 MG tablet, Take 1.5 tablets (150 mg total) by mouth daily., Disp: 45 tablet, Rfl: 3 .  silver sulfADIAZINE (SILVADENE) 1 % cream, Apply 1 application topically 2 (two) times daily. (Patient taking differently: Apply 1 application topically 2 (two) times daily as needed (wound care). ), Disp: 50 g, Rfl: 0 .  St Johns Wort 300 MG CAPS, Take 900 mg by mouth 2 (two) times daily. , Disp: , Rfl:  .  VENTOLIN HFA 108 (90 Base) MCG/ACT inhaler,  INHALE 2 PUFFS BY MOUTH EVERY 6 HOURS AS NEEDED, Disp: 18 each, Rfl: 1  Allergies  Allergen Reactions  . Benzalkonium Chloride Anaphylaxis and Hives  . Neosporin [Neomycin-Bacitracin Zn-Polymyx] Anaphylaxis and Hives  . Polysporin [Bacitracin-Polymyxin B] Hives  . Bee Venom Hives and Swelling    Throat swelling  . Bupropion Hcl Other (See Comments)    angry  . Cephalexin Hives and Swelling  . Latex     Objective:  Temp 98.7 F (37.1 C) (Oral)   VITALS: Per patient if applicable, see vitals. GENERAL: Alert, appears well and in no acute distress. HEENT: Atraumatic, conjunctiva clear, no obvious abnormalities on inspection of external nose and ears. NECK: Normal movements of the head and neck. CARDIOPULMONARY: No increased WOB. Speaking in clear sentences. I:E ratio WNL.  MS: Moves all visible extremities without noticeable abnormality. PSYCH: Pleasant and cooperative, well-groomed. Speech normal rate and rhythm. Affect is appropriate. Insight and  judgement are appropriate. Attention is focused, linear, and appropriate.  NEURO: CN grossly intact. Oriented as arrived to appointment on time with no prompting. Moves both UE equally.  SKIN: No obvious lesions, wounds, erythema, or cyanosis noted on face or hands.    Assessment and Plan:   Tamkia was seen today for fever.  Diagnoses and all orders for this visit:  Moderate persistent asthma with acute exacerbation  Acute bronchitis, unspecified organism  Other sinusitis, unspecified chronicity    . COVID-19 Education: The signs and symptoms of COVID-19 were discussed with the patient and how to seek care for testing if needed. The importance of social distancing was discussed today. . Reviewed expectations re: course of current medical issues. . Discussed self-management of symptoms. . Outlined signs and symptoms indicating need for more acute intervention. . Patient verbalized understanding and all questions were answered. Marland Kitchen Health Maintenance issues including appropriate healthy diet, exercise, and smoking avoidance were discussed with patient. . See orders for this visit as documented in the electronic medical record.  Arnette Norris, MD  Records requested if needed. Time spent: 25 minutes, of which >50% was spent in obtaining information about her symptoms, reviewing her previous labs, evaluations, and treatments, counseling her about her condition (please see the discussed topics above), and developing a plan to further investigate it; she had a number of questions which I addressed.

## 2018-10-27 ENCOUNTER — Encounter (INDEPENDENT_AMBULATORY_CARE_PROVIDER_SITE_OTHER): Payer: Self-pay

## 2018-10-27 NOTE — Telephone Encounter (Signed)
Spoke with Vilma Meckel, RMA regarding the below message. She will follow-up with the patient.

## 2018-10-27 NOTE — Telephone Encounter (Signed)
LMOVM for pt to RTC to advise how she is doing/if her cough is any worse or better/if there is anything that she is needing from us/thx dmf

## 2018-10-29 ENCOUNTER — Encounter: Payer: Self-pay | Admitting: Family Medicine

## 2018-10-29 NOTE — Telephone Encounter (Signed)
LMOVM for pt to RTC ASAP to advise on how she is doing/thx dmf

## 2018-11-01 ENCOUNTER — Encounter (INDEPENDENT_AMBULATORY_CARE_PROVIDER_SITE_OTHER): Payer: Self-pay

## 2018-11-03 ENCOUNTER — Encounter (INDEPENDENT_AMBULATORY_CARE_PROVIDER_SITE_OTHER): Payer: Self-pay

## 2018-11-03 ENCOUNTER — Telehealth: Payer: Self-pay | Admitting: Hematology

## 2018-11-03 NOTE — Telephone Encounter (Signed)
Attempted to call patient to follow up on her COVID screening. / No answer.  Will try again later.

## 2018-11-04 NOTE — Telephone Encounter (Signed)
Attempted to contact pt; left message on voicemail; pt normally sees Dr [name], Audrie Lia; will route to provider for notification.

## 2018-11-04 NOTE — Telephone Encounter (Signed)
Attempted to contact pt regarding my chart companion responses of worsening shortness of breath, and diarrhea on 11/03/2018; left message on voicemail for pt to call back to discuss symptoms.

## 2018-12-15 ENCOUNTER — Encounter: Payer: Self-pay | Admitting: Family Medicine

## 2019-02-02 ENCOUNTER — Encounter: Payer: Self-pay | Admitting: Family Medicine

## 2019-02-02 ENCOUNTER — Other Ambulatory Visit: Payer: Self-pay | Admitting: Family Medicine

## 2019-02-08 ENCOUNTER — Ambulatory Visit: Payer: BC Managed Care – PPO | Admitting: Family Medicine

## 2019-02-09 ENCOUNTER — Telehealth: Payer: Self-pay

## 2019-02-09 ENCOUNTER — Ambulatory Visit: Payer: BC Managed Care – PPO | Admitting: Family Medicine

## 2019-02-09 NOTE — Progress Notes (Deleted)
Subjective:   Patient ID: Danielle Irwin, female    DOB: 1970/09/24, 48 y.o.   MRN: [id number]  Danielle Irwin is a pleasant 48 y.o. year old female who presents to clinic today with No chief complaint on file.  on 02/10/2019  HPI:  Depression-  Last saw patient for this on 05/26/18.  Note reviewed.  Vit D was a little low at 26.79.  Advised to restart taking her Vitamin D at previous dose- 5000 IU twice daily. She deferred psychotherapy at previous OV. We also increased her zoloft to 150 mg daily and added Lyrica 75 mg twice daily.   At that time, she said there her depression was better she was "mentally more awake and less irritable."  Fibromyalgia did improve as well, also taking Lyrica 75 mg twice daily.  Depression screen [facility] 2/9 05/26/2018 05/12/2018 06/24/2017  Decreased Interest 2 3 2   Down, Depressed, Hopeless 2 3 2   PHQ - 2 Score 4 6 4   Altered sleeping 3 3 2   Tired, decreased energy 3 3 3   Change in appetite 1 3 1   Feeling bad or failure about yourself  1 3 3   Trouble concentrating 2 3 0  Moving slowly or fidgety/restless 1 2 1   Suicidal thoughts - 0 0  PHQ-9 Score 15 23 14   Difficult doing work/chores Somewhat difficult Very difficult Extremely dIfficult   GAD 7 : Generalized Anxiety Score 05/26/2018 05/12/2018  Nervous, Anxious, on Edge 2 3  Control/stop worrying 3 3  Worry too much - different things 3 3  Trouble relaxing 2 3  Restless 1 3  Easily annoyed or irritable 1 3  Afraid - awful might happen 0 3  Total GAD 7 Score 12 21  Anxiety Difficulty Somewhat difficult Very difficult      Current Outpatient Medications on File Prior to Visit  Medication Sig Dispense Refill  . Alum & Mag Hydroxide-Simeth (ANTACID ANTI-GAS PO) Take 1 tablet by mouth daily as needed (for gas).    . Ascorbic Acid (VITAMIN C) 1000 MG tablet Take 1,000 mg by mouth 2 (two) times daily.    Marland Kitchen aspirin EC 81 MG tablet Take 81 mg by mouth daily.    Marland Kitchen aspirin-acetaminophen-caffeine  (EXCEDRIN MIGRAINE) 250-250-65 MG tablet Take 2 tablets by mouth 2 (two) times daily as needed for headache or migraine.    Marland Kitchen b complex vitamins tablet Take 1 tablet by mouth daily.    Marland Kitchen bismuth subsalicylate (PEPTO-BISMOL) 262 MG/15ML suspension Take 30 mLs by mouth 4 (four) times daily -  before meals and at bedtime. 360 mL 0  . Calcium Carbonate-Vitamin D (CALCIUM-D PO) Take 1,200 mg by mouth 2 (two) times daily.    . Cholecalciferol (VITAMIN D-3) 5000 units TABS Take 5,000 Units by mouth 2 (two) times daily.    . diphenhydrAMINE (BENADRYL) 25 MG tablet Take 25-50 mg by mouth every 6 (six) hours as needed for itching, allergies or sleep.    Marland Kitchen docusate sodium (COLACE) 100 MG capsule Take 1 capsule (100 mg total) by mouth 2 (two) times daily as needed for mild constipation. 20 capsule 1  . ECHINACEA-GOLDEN SEAL PO Take 1 tablet by mouth 2 (two) times daily as needed (immune system boost).     . hydrochlorothiazide (HYDRODIURIL) 25 MG tablet Take 1 tablet by mouth once daily 90 tablet 0  . levofloxacin (LEVAQUIN) 500 MG tablet Take 1 tablet (500 mg total) by mouth daily. 7 tablet 0  . lisinopril (ZESTRIL) 10  MG tablet Take 1 tablet by mouth once daily 90 tablet 0  . Magnesium 500 MG TABS Take 500 mg by mouth at bedtime.    . Melatonin 10 MG TABS Take 1 tablet by mouth at bedtime.     . Multiple Vitamin (MULTIVITAMIN WITH MINERALS) TABS tablet Take 1 tablet by mouth daily.    . Multiple Vitamins-Minerals (ZINC PO) Take 500 mg by mouth daily as needed (immune system boost).    . mupirocin ointment (BACTROBAN) 2 % Apply 1 application topically 2 (two) times daily. To affected area 30 g 1  . ondansetron (ZOFRAN) 4 MG tablet Take 1-2 tablets (4-8 mg total) by mouth every 8 (eight) hours as needed for nausea or vomiting. 20 tablet 0  . predniSONE (DELTASONE) 10 MG tablet 3 tabs by mouth x 3 days, 2 tabs by mouth x 2 days, 1 tab by mouth x 2 days and stop. 15 tablet 0  . pregabalin (LYRICA) 75 MG capsule  Take 1 capsule (75 mg total) by mouth 2 (two) times daily. 60 capsule 3  . senna-docusate (SENOKOT-S) 8.6-50 MG tablet Take 1 tablet by mouth at bedtime as needed for mild constipation. 15 tablet 0  . sertraline (ZOLOFT) 100 MG tablet TAKE ONE AND ONE-HALF TABLETS BY MOUTH ONCE DAILY 45 tablet 3  . silver sulfADIAZINE (SILVADENE) 1 % cream Apply 1 application topically 2 (two) times daily. (Patient taking differently: Apply 1 application topically 2 (two) times daily as needed (wound care). ) 50 g 0  . St Johns Wort 300 MG CAPS Take 900 mg by mouth 2 (two) times daily.     . VENTOLIN HFA 108 (90 Base) MCG/ACT inhaler INHALE 2 PUFFS BY MOUTH EVERY 6 HOURS AS NEEDED 18 each 1   No current facility-administered medications on file prior to visit.     Allergies  Allergen Reactions  . Benzalkonium Chloride Anaphylaxis and Hives  . Neosporin [Neomycin-Bacitracin Zn-Polymyx] Anaphylaxis and Hives  . Polysporin [Bacitracin-Polymyxin B] Hives  . Bee Venom Hives and Swelling    Throat swelling  . Bupropion Hcl Other (See Comments)    angry  . Cephalexin Hives and Swelling  . Latex     Past Medical History:  Diagnosis Date  . Asthma   . Depression   . Family history of adverse reaction to anesthesia    FATHER WAS SLOW TO WAKE UP  . Headache    OCCASIONAL MIGRAINES  . HNP (herniated nucleus pulposus), lumbar   . Hx of epistaxis   . Insomnia   . OA (osteoarthritis)   . Obesity   . PCOS (polycystic ovarian syndrome)   . Sensitive skin   . Vitamin D deficiency     Past Surgical History:  Procedure Laterality Date  . LUMBAR LAMINECTOMY/DECOMPRESSION MICRODISCECTOMY Right 06/02/2014   Procedure: MICRO LUMBAR DECOMPRESSION L4 - L5 ON THE RIGHT 1 LEVEL;  Surgeon: Johnn Hai, MD;  Location: [facility];  Service: Orthopedics;  Laterality: Right;  . ORIF HUMERUS FRACTURE Left 08/01/2017   Procedure: OPEN REDUCTION INTERNAL FIXATION (ORIF) PROXIMAL HUMERUS FRACTURE;  Surgeon: Shona Needles,  MD;  Location: [place];  Service: Orthopedics;  Laterality: Left;  . WISDOM TOOTH EXTRACTION      Family History  Problem Relation Age of Onset  . Heart attack Father 83  . Breast cancer Maternal Aunt 56    Social History   Socioeconomic History  . Marital status: Single    Spouse name: Not on file  .  Number of children: Not on file  . Years of education: Not on file  . Highest education level: Not on file  Occupational History  . Not on file  Social Needs  . Financial resource strain: Not on file  . Food insecurity    Worry: Not on file    Inability: Not on file  . Transportation needs    Medical: Not on file    Non-medical: Not on file  Tobacco Use  . Smoking status: Former Smoker    Quit date: 05/26/1990    Years since quitting: 28.7  . Smokeless tobacco: Never Used  Substance and Sexual Activity  . Alcohol use: No    Alcohol/week: 0.0 standard drinks  . Drug use: No  . Sexual activity: Not on file  Lifestyle  . Physical activity    Days per week: Not on file    Minutes per session: Not on file  . Stress: Not on file  Relationships  . Social Herbalist on phone: Not on file    Gets together: Not on file    Attends religious service: Not on file    Active member of club or organization: Not on file    Attends meetings of clubs or organizations: Not on file    Relationship status: Not on file  . Intimate partner violence    Fear of current or ex partner: Not on file    Emotionally abused: Not on file    Physically abused: Not on file    Forced sexual activity: Not on file  Other Topics Concern  . Not on file  Social History Narrative  . Not on file   The PMH, PSH, Social History, Family History, Medications, and allergies have been reviewed in [facility], and have been updated if relevant.   Review of Systems     Objective:    There were no vitals taken for this visit.   Physical Exam        Assessment & Plan:   Edema, unspecified type   Depression, unspecified depression type  Fibromyalgia No follow-ups on file.

## 2019-02-09 NOTE — Telephone Encounter (Signed)
Questions for Screening COVID-19  Symptom onset: None  Travel or Contacts: None  During this illness, did/does the patient experience any of the following symptoms? Fever >100.69F []   Yes [x]   No []   Unknown Subjective fever (felt feverish) []   Yes [x]   No []   Unknown Chills []   Yes [x]   No []   Unknown Muscle aches (myalgia) []   Yes [x]   No []   Unknown Runny nose (rhinorrhea) []   Yes [x]   No []   Unknown Sore throat []   Yes [x]   No []   Unknown Cough (new onset or worsening of chronic cough) []   Yes [x]   No []   Unknown Shortness of breath (dyspnea) []   Yes [x]   No []   Unknown Nausea or vomiting []   Yes [x]   No []   Unknown Headache []   Yes [x]   No []   Unknown Abdominal pain  []   Yes [x]   No []   Unknown Diarrhea (?3 loose/looser than normal stools/24hr period) []   Yes [x]   No []   Unknown Other, specify:  Patient risk factors: Smoker? []   Current []   Former []   Never If female, currently pregnant? []   Yes []   No  Patient Active Problem List   Diagnosis Date Noted  . Sinus infection 10/26/2018  . Suspected Covid-19 Virus Infection 10/08/2018  . Influenza-like illness 09/28/2018  . Bronchitis, acute 05/26/2018  . Fibromyalgia 05/12/2018  . Edema 05/12/2018  . BMI 60.0-69.9, adult ([place]) 05/12/2018  . DDD (degenerative disc disease), thoracolumbar 01/06/2018  . DVT (deep venous thrombosis) ([place]) 08/29/2017  . Leukocytosis 08/06/2017  . Humerus fracture 08/01/2017  . CPAP (continuous positive airway pressure) dependence 08/01/2017  . Asthma 08/01/2017  . HTN (hypertension) 06/24/2017  . Depression 11/07/2015  . Systolic murmur 0000000  . Morbid obesity ([place]) 10/26/2014  . Insomnia 06/21/2014  . Anxiety 06/21/2014  . Spinal stenosis at L4-L5 level 06/02/2014  . POLYCYSTIC OVARIAN DISEASE 06/19/2010    Plan:  []   High risk for COVID-19 with red flags go to ED (with CP, SOB, weak/lightheaded, or fever > 101.5). Call ahead.  []   High risk for COVID-19 but stable. Inform  provider and coordinate time for [facility] visit.   []   No red flags but URI signs or symptoms okay for [facility] visit.

## 2019-02-10 ENCOUNTER — Ambulatory Visit: Payer: BC Managed Care – PPO | Admitting: Family Medicine

## 2019-02-10 ENCOUNTER — Encounter: Payer: Self-pay | Admitting: Family Medicine

## 2019-03-02 ENCOUNTER — Ambulatory Visit: Payer: BC Managed Care – PPO | Admitting: Family Medicine

## 2019-03-16 ENCOUNTER — Encounter: Payer: BC Managed Care – PPO | Admitting: Family Medicine

## 2019-03-19 ENCOUNTER — Telehealth: Payer: Self-pay

## 2019-03-19 NOTE — Telephone Encounter (Signed)
  TA- this pt is schedule to see you on Monday, pt states she does have a cough but its not new believes its due to her allergies. States she has had a low grade fever. One day last week reached 100 but didn't last. Please let staff know if ok to still be seen.    Questions for Screening COVID-19  Symptom onset: unsure  Travel or Contacts: none  During this illness, did/does the patient experience any of the following symptoms? Fever >100.55F []   Yes [x]   No []   Unknown Subjective fever (felt feverish) []   Yes []   No [x]   Unknown Chills []   Yes [x]   No []   Unknown Muscle aches (myalgia) []   Yes [x]   No []   Unknown Runny nose (rhinorrhea) []   Yes [x]   No []   Unknown Sore throat []   Yes [x]   No []   Unknown Cough (new onset or worsening of chronic cough) []   Yes []   No [x]   Unknown Shortness of breath (dyspnea) []   Yes [x]   No []   Unknown Nausea or vomiting []   Yes [x]   No []   Unknown Headache []   Yes [x]   No []   Unknown Abdominal pain  []   Yes [x]   No []   Unknown Diarrhea (?3 loose/looser than normal stools/24hr period) []   Yes [x]   No []   Unknown Other, specify:

## 2019-03-22 ENCOUNTER — Encounter: Payer: BC Managed Care – PPO | Admitting: Family Medicine

## 2019-03-24 ENCOUNTER — Encounter: Payer: Self-pay | Admitting: Family Medicine

## 2019-03-25 ENCOUNTER — Other Ambulatory Visit: Payer: Self-pay

## 2019-03-25 ENCOUNTER — Ambulatory Visit (INDEPENDENT_AMBULATORY_CARE_PROVIDER_SITE_OTHER): Payer: BC Managed Care – PPO | Admitting: Family Medicine

## 2019-03-25 ENCOUNTER — Encounter: Payer: Self-pay | Admitting: Family Medicine

## 2019-03-25 DIAGNOSIS — J069 Acute upper respiratory infection, unspecified: Secondary | ICD-10-CM

## 2019-03-25 DIAGNOSIS — J40 Bronchitis, not specified as acute or chronic: Secondary | ICD-10-CM | POA: Insufficient documentation

## 2019-03-25 MED ORDER — PREDNISONE 10 MG PO TABS: ORAL_TABLET | ORAL | 0 refills | Status: DC

## 2019-03-25 MED ORDER — LEVOFLOXACIN 500 MG PO TABS: 500.0000 mg | ORAL_TABLET | Freq: Every day | ORAL | 0 refills | Status: DC

## 2019-03-25 NOTE — Assessment & Plan Note (Signed)
>  25 minutes spent in face to face time with patient, >50% spent in counselling or coordination of care.  Symptoms do seem very consistent with her typical sinusitis/bronchitis that she gets this time of year.  Will place on covid isolation precautions.  Letter sent through my chart for her employer. Given duration and progression of symptoms, will treat for bacterial sinusitis and bronchitis with levaquin, steroid burst. Continue covid home monitoring. Call or send my chart message prn if these symptoms worsen or fail to improve as anticipated. The patient indicates understanding of these issues and agrees with the plan.

## 2019-03-25 NOTE — Progress Notes (Signed)
Virtual Visit via Video   Due to the COVID-19 pandemic, this visit was completed with telemedicine (audio/video) technology to reduce patient and provider exposure as well as to preserve personal protective equipment.   I connected with Danielle Irwin by a video enabled telemedicine application and verified that I am speaking with the correct person using two identifiers. Location patient: Home Location provider: [facility], Office Persons participating in the virtual visit: Jozey M Willcutt, Arnette Norris, MD   I discussed the limitations of evaluation and management by telemedicine and the availability of in person appointments. The patient expressed understanding and agreed to proceed.  Care Team   Patient Care Team: Lucille Passy, MD as PCP - General (Family Medicine) Altamese Robbins, MD as Consulting Physician (Orthopedic Surgery) Altamese Warsaw, MD as Consulting Physician (Orthopedic Surgery)  Subjective:   HPI:   URI symptoms- cough that worsened this past week.  Cough is not productive.  Sore throat, headache, fever Tmax 100.6, wheezing.   Feels very consistent with her typical bronchitis/sinusitis she gets this time of year.  No fatigue, no myalgias.   Review of Systems  Constitutional: Positive for fever. Negative for malaise/fatigue.  HENT: Positive for congestion and sore throat. Negative for ear pain, hearing loss and tinnitus.   Eyes: Negative.   Respiratory: Positive for cough and wheezing. Negative for hemoptysis, sputum production and shortness of breath.   Cardiovascular: Negative.   Gastrointestinal: Negative.   Genitourinary: Negative.   Musculoskeletal: Negative.   Skin: Negative.   Neurological: Negative.   Endo/Heme/Allergies: Negative.   Psychiatric/Behavioral: Negative.   All other systems reviewed and are negative.    Patient Active Problem List   Diagnosis Date Noted   Upper respiratory infection 03/25/2019   Sinus infection 10/26/2018    Suspected COVID-19 virus infection 10/08/2018   Influenza-like illness 09/28/2018   Fibromyalgia 05/12/2018   Edema 05/12/2018   BMI 60.0-69.9, adult ([place]) 05/12/2018   DDD (degenerative disc disease), thoracolumbar 01/06/2018   DVT (deep venous thrombosis) ([place]) 08/29/2017   Leukocytosis 08/06/2017   Humerus fracture 08/01/2017   CPAP (continuous positive airway pressure) dependence 08/01/2017   Asthma 08/01/2017   HTN (hypertension) 06/24/2017   Depression 0000000   Systolic murmur 0000000   Morbid obesity ([place]) 10/26/2014   Insomnia 06/21/2014   Anxiety 06/21/2014   Spinal stenosis at L4-L5 level 06/02/2014   POLYCYSTIC OVARIAN DISEASE 06/19/2010    Social History   Tobacco Use   Smoking status: Former Smoker    Quit date: 05/26/1990    Years since quitting: 28.8   Smokeless tobacco: Never Used  Substance Use Topics   Alcohol use: No    Alcohol/week: 0.0 standard drinks    Current Outpatient Medications:    acetaminophen (TYLENOL) 650 MG CR tablet, Take 650 mg by mouth every 8 (eight) hours as needed for pain., Disp: , Rfl:    Alum & Mag Hydroxide-Simeth (ANTACID ANTI-GAS PO), Take 1 tablet by mouth daily as needed (for gas)., Disp: , Rfl:    Ascorbic Acid (VITAMIN C) 1000 MG tablet, Take 1,000 mg by mouth 2 (two) times daily., Disp: , Rfl:    aspirin EC 81 MG tablet, Take 81 mg by mouth daily., Disp: , Rfl:    aspirin-acetaminophen-caffeine (EXCEDRIN MIGRAINE) 250-250-65 MG tablet, Take 2 tablets by mouth 2 (two) times daily as needed for headache or migraine., Disp: , Rfl:    b complex vitamins tablet, Take 1 tablet by mouth daily., Disp: , Rfl:  bismuth subsalicylate (PEPTO-BISMOL) 262 MG/15ML suspension, Take 30 mLs by mouth 4 (four) times daily -  before meals and at bedtime., Disp: 360 mL, Rfl: 0   Calcium Carbonate-Vitamin D (CALCIUM-D PO), Take 1,200 mg by mouth 2 (two) times daily., Disp: , Rfl:    Cholecalciferol (VITAMIN  D-3) 5000 units TABS, Take 5,000 Units by mouth 2 (two) times daily., Disp: , Rfl:    diphenhydrAMINE (BENADRYL) 25 MG tablet, Take 25-50 mg by mouth every 6 (six) hours as needed for itching, allergies or sleep., Disp: , Rfl:    docusate sodium (COLACE) 100 MG capsule, Take 1 capsule (100 mg total) by mouth 2 (two) times daily as needed for mild constipation., Disp: 20 capsule, Rfl: 1   hydrochlorothiazide (HYDRODIURIL) 25 MG tablet, Take 1 tablet by mouth once daily, Disp: 90 tablet, Rfl: 0   levofloxacin (LEVAQUIN) 500 MG tablet, Take 1 tablet (500 mg total) by mouth daily., Disp: 7 tablet, Rfl: 0   lisinopril (ZESTRIL) 10 MG tablet, Take 1 tablet by mouth once daily, Disp: 90 tablet, Rfl: 0   Magnesium 500 MG TABS, Take 500 mg by mouth at bedtime., Disp: , Rfl:    Melatonin 10 MG TABS, Take 1 tablet by mouth at bedtime. , Disp: , Rfl:    Multiple Vitamin (MULTIVITAMIN WITH MINERALS) TABS tablet, Take 1 tablet by mouth daily., Disp: , Rfl:    Multiple Vitamins-Minerals (ZINC PO), Take 500 mg by mouth daily as needed (immune system boost)., Disp: , Rfl:    mupirocin ointment (BACTROBAN) 2 %, Apply 1 application topically 2 (two) times daily. To affected area, Disp: 30 g, Rfl: 1   ondansetron (ZOFRAN) 4 MG tablet, Take 1-2 tablets (4-8 mg total) by mouth every 8 (eight) hours as needed for nausea or vomiting., Disp: 20 tablet, Rfl: 0   pregabalin (LYRICA) 75 MG capsule, Take 1 capsule (75 mg total) by mouth 2 (two) times daily., Disp: 60 capsule, Rfl: 3   Pseudoephedrine-APAP-DM (DAYQUIL PO), Take by mouth., Disp: , Rfl:    senna-docusate (SENOKOT-S) 8.6-50 MG tablet, Take 1 tablet by mouth at bedtime as needed for mild constipation., Disp: 15 tablet, Rfl: 0   sertraline (ZOLOFT) 100 MG tablet, TAKE ONE AND ONE-HALF TABLETS BY MOUTH ONCE DAILY, Disp: 45 tablet, Rfl: 3   silver sulfADIAZINE (SILVADENE) 1 % cream, Apply 1 application topically 2 (two) times daily. (Patient taking  differently: Apply 1 application topically 2 (two) times daily as needed (wound care). ), Disp: 50 g, Rfl: 0   St Johns Wort 300 MG CAPS, Take 900 mg by mouth 2 (two) times daily. , Disp: , Rfl:    VENTOLIN HFA 108 (90 Base) MCG/ACT inhaler, INHALE 2 PUFFS BY MOUTH EVERY 6 HOURS AS NEEDED, Disp: 18 each, Rfl: 1   ECHINACEA-GOLDEN SEAL PO, Take 1 tablet by mouth 2 (two) times daily as needed (immune system boost). , Disp: , Rfl:    predniSONE (DELTASONE) 10 MG tablet, 3 tabs by mouth x 3 days, 2 tabs by mouth x 2 days, 1 tab by mouth x 2 days and stop., Disp: 15 tablet, Rfl: 0  Allergies  Allergen Reactions   Benzalkonium Chloride Anaphylaxis and Hives   Neosporin [Neomycin-Bacitracin Zn-Polymyx] Anaphylaxis and Hives   Polysporin [Bacitracin-Polymyxin B] Hives   Bee Venom Hives and Swelling    Throat swelling   Bupropion Hcl Other (See Comments)    angry   Cephalexin Hives and Swelling   Latex     Objective:  Temp 99.7 F (37.6 C) (Oral)    Ht 5\' 7"  (1.702 m)    Wt (!) 369 lb (167.4 kg)    LMP 07/11/2016    BMI 57.79 kg/m   VITALS: Per patient if applicable, see vitals. GENERAL: Alert, appears well and in no acute distress. HEENT: Atraumatic, conjunctiva clear, no obvious abnormalities on inspection of external nose and ears. TTP when patient palpates maxillary and frontal sinuses NECK: Normal movements of the head and neck. CARDIOPULMONARY: No increased WOB. Speaking in clear sentences. I:E ratio WNL.  MS: Moves all visible extremities without noticeable abnormality. PSYCH: Pleasant and cooperative, well-groomed. Speech normal rate and rhythm. Affect is appropriate. Insight and judgement are appropriate. Attention is focused, linear, and appropriate.  NEURO: CN grossly intact. Oriented as arrived to appointment on time with no prompting. Moves both UE equally.  SKIN: No obvious lesions, wounds, erythema, or cyanosis noted on face or hands.  Depression screen [facility] 2/9  03/25/2019 05/26/2018 05/12/2018  Decreased Interest 0 2 3  Down, Depressed, Hopeless 3 2 3   PHQ - 2 Score 3 4 6   Altered sleeping 1 3 3   Tired, decreased energy 3 3 3   Change in appetite 0 1 3  Feeling bad or failure about yourself  0 1 3  Trouble concentrating 0 2 3  Moving slowly or fidgety/restless 0 1 2  Suicidal thoughts 0 - 0  PHQ-9 Score 7 15 23   Difficult doing work/chores Somewhat difficult Somewhat difficult Very difficult    Assessment and Plan:   Danielle Irwin was seen today for cough, headache, sore throat and fever.  Diagnoses and all orders for this visit:  Upper respiratory tract infection, unspecified type  Other orders -     levofloxacin (LEVAQUIN) 500 MG tablet; Take 1 tablet (500 mg total) by mouth daily. -     predniSONE (DELTASONE) 10 MG tablet; 3 tabs by mouth x 3 days, 2 tabs by mouth x 2 days, 1 tab by mouth x 2 days and stop.     COVID-19 Education: The signs and symptoms of COVID-19 were discussed with the patient and how to seek care for testing if needed. The importance of social distancing was discussed today.  Reviewed expectations re: course of current medical issues.  Discussed self-management of symptoms.  Outlined signs and symptoms indicating need for more acute intervention.  Patient verbalized understanding and all questions were answered.  Health Maintenance issues including appropriate healthy diet, exercise, and smoking avoidance were discussed with patient.  See orders for this visit as documented in the electronic medical record.  Arnette Norris, MD  Records requested if needed. Time spent: 25 minutes, of which >50% was spent in obtaining information about her symptoms, reviewing her previous labs, evaluations, and treatments, counseling her about her condition (please see the discussed topics above), and developing a plan to further investigate it; she had a number of questions which I addressed.

## 2019-03-26 ENCOUNTER — Encounter (INDEPENDENT_AMBULATORY_CARE_PROVIDER_SITE_OTHER): Payer: Self-pay

## 2019-03-27 ENCOUNTER — Encounter (INDEPENDENT_AMBULATORY_CARE_PROVIDER_SITE_OTHER): Payer: Self-pay

## 2019-03-27 ENCOUNTER — Telehealth: Payer: Self-pay

## 2019-03-27 NOTE — Telephone Encounter (Signed)
[place] Call regarding [place] -   Patient reports having diarrhea has gotten worse - gone to the bathroom about 4 times today. Patient reports being put back on antibiotics by her PCP and didn't know if that was what is causing the diarrhea. Reviewed MyChart Companion algorithm for Home Monitoring of COVID19 - Diarrhea protocol with patient, no further questions.

## 2019-03-29 ENCOUNTER — Encounter (INDEPENDENT_AMBULATORY_CARE_PROVIDER_SITE_OTHER): Payer: Self-pay

## 2019-03-30 ENCOUNTER — Encounter (INDEPENDENT_AMBULATORY_CARE_PROVIDER_SITE_OTHER): Payer: Self-pay

## 2019-03-31 ENCOUNTER — Encounter (INDEPENDENT_AMBULATORY_CARE_PROVIDER_SITE_OTHER): Payer: Self-pay

## 2019-04-01 ENCOUNTER — Encounter (INDEPENDENT_AMBULATORY_CARE_PROVIDER_SITE_OTHER): Payer: Self-pay

## 2019-04-03 ENCOUNTER — Encounter (INDEPENDENT_AMBULATORY_CARE_PROVIDER_SITE_OTHER): Payer: Self-pay

## 2019-04-04 ENCOUNTER — Encounter (INDEPENDENT_AMBULATORY_CARE_PROVIDER_SITE_OTHER): Payer: Self-pay

## 2019-04-05 ENCOUNTER — Encounter (INDEPENDENT_AMBULATORY_CARE_PROVIDER_SITE_OTHER): Payer: Self-pay

## 2019-04-07 ENCOUNTER — Encounter (INDEPENDENT_AMBULATORY_CARE_PROVIDER_SITE_OTHER): Payer: Self-pay

## 2019-05-26 ENCOUNTER — Other Ambulatory Visit: Payer: Self-pay | Admitting: Family Medicine

## 2019-06-01 ENCOUNTER — Other Ambulatory Visit: Payer: Self-pay | Admitting: Family Medicine

## 2019-06-01 ENCOUNTER — Encounter: Payer: Self-pay | Admitting: Family Medicine

## 2019-06-01 MED ORDER — HYDROCHLOROTHIAZIDE 25 MG PO TABS: 25.0000 mg | ORAL_TABLET | Freq: Every day | ORAL | 0 refills | Status: DC

## 2019-06-01 MED ORDER — LISINOPRIL 10 MG PO TABS: 10.0000 mg | ORAL_TABLET | Freq: Every day | ORAL | 0 refills | Status: DC

## 2019-06-01 MED ORDER — SERTRALINE HCL 100 MG PO TABS: 150.0000 mg | ORAL_TABLET | Freq: Every day | ORAL | 3 refills | Status: DC

## 2019-06-01 NOTE — Telephone Encounter (Signed)
Last Ov 03/25/19 Last fill for Setraline 10/26/18  #45/3 Last fill for Lisinopril 02/02/19  #90/0 Hydrochlorothiazide 02/02/19  #90/0

## 2019-06-05 ENCOUNTER — Other Ambulatory Visit: Payer: Self-pay | Admitting: Family Medicine

## 2019-06-07 NOTE — Telephone Encounter (Signed)
Dr. [name] please advise, this rx discontinue 05/2018. Pt had an acute with you 03/25/2019.

## 2019-06-20 NOTE — Progress Notes (Signed)
Virtual Visit via Video   Due to the COVID-19 pandemic, this visit was completed with telemedicine (audio/video) technology to reduce patient and provider exposure as well as to preserve personal protective equipment.   I connected with Danielle Irwin by a video enabled telemedicine application and verified that I am speaking with the correct person using two identifiers. Location patient: Home Location provider: [facility], Office Persons participating in the virtual visit: Leitha M Bethard, Arnette Norris, MD   I discussed the limitations of evaluation and management by telemedicine and the availability of in person appointments. The patient expressed understanding and agreed to proceed.  Interactive audio and video telecommunications were attempted between this provider and patient, however failed, due to patient having technical difficulties OR patient did not have access to video capability.  We continued and completed visit with audio only.   Care Team   Patient Care Team: Lucille Passy, MD as PCP - General (Family Medicine) Altamese Lyerly, MD as Consulting Physician (Orthopedic Surgery) Altamese Roca, MD as Consulting Physician (Orthopedic Surgery)  Subjective:   HPI:  Pt complains of Non productive cough, headaches, body aches, fluctuating temperature, runny nose, sore throat. x 1week.  Has loss sense of taste and smell.  Review of Systems  Constitutional: Positive for fever and malaise/fatigue.  HENT: Positive for sore throat. Negative for congestion and hearing loss.   Eyes: Negative for blurred vision, discharge and redness.  Respiratory: Positive for cough. Negative for hemoptysis, sputum production, shortness of breath and wheezing.   Cardiovascular: Negative for chest pain, palpitations and leg swelling.  Gastrointestinal: Negative for abdominal pain and heartburn.  Genitourinary: Negative for dysuria.  Musculoskeletal: Positive for myalgias. Negative for falls.    Skin: Negative for rash.  Neurological: Negative for loss of consciousness and headaches.  Endo/Heme/Allergies: Does not bruise/bleed easily.  Psychiatric/Behavioral: Negative for depression.  All other systems reviewed and are negative.    Patient Active Problem List   Diagnosis Date Noted  . Upper respiratory infection 03/25/2019  . Sinus infection 10/26/2018  . Suspected COVID-19 virus infection 10/08/2018  . Influenza-like illness 09/28/2018  . Fibromyalgia 05/12/2018  . Edema 05/12/2018  . BMI 60.0-69.9, adult ([place]) 05/12/2018  . DDD (degenerative disc disease), thoracolumbar 01/06/2018  . DVT (deep venous thrombosis) ([place]) 08/29/2017  . Leukocytosis 08/06/2017  . Humerus fracture 08/01/2017  . CPAP (continuous positive airway pressure) dependence 08/01/2017  . Asthma 08/01/2017  . HTN (hypertension) 06/24/2017  . Depression 11/07/2015  . Systolic murmur 0000000  . Morbid obesity ([place]) 10/26/2014  . Insomnia 06/21/2014  . Anxiety 06/21/2014  . Spinal stenosis at L4-L5 level 06/02/2014  . POLYCYSTIC OVARIAN DISEASE 06/19/2010    Social History   Tobacco Use  . Smoking status: Former Smoker    Quit date: 05/26/1990    Years since quitting: 29.0  . Smokeless tobacco: Never Used  Substance Use Topics  . Alcohol use: No    Alcohol/week: 0.0 standard drinks    Current Outpatient Medications:  .  acetaminophen (TYLENOL) 650 MG CR tablet, Take 650 mg by mouth every 8 (eight) hours as needed for pain., Disp: , Rfl:  .  Alum & Mag Hydroxide-Simeth (ANTACID ANTI-GAS PO), Take 1 tablet by mouth daily as needed (for gas)., Disp: , Rfl:  .  Ascorbic Acid (VITAMIN C) 1000 MG tablet, Take 1,000 mg by mouth 2 (two) times daily., Disp: , Rfl:  .  aspirin EC 81 MG tablet, Take 81 mg by mouth daily., Disp: ,  Rfl:  .  aspirin-acetaminophen-caffeine (EXCEDRIN MIGRAINE) 250-250-65 MG tablet, Take 2 tablets by mouth 2 (two) times daily as needed for headache or migraine., Disp: ,  Rfl:  .  b complex vitamins tablet, Take 1 tablet by mouth daily., Disp: , Rfl:  .  bismuth subsalicylate (PEPTO-BISMOL) 262 MG/15ML suspension, Take 30 mLs by mouth 4 (four) times daily -  before meals and at bedtime., Disp: 360 mL, Rfl: 0 .  Calcium Carbonate-Vitamin D (CALCIUM-D PO), Take 1,200 mg by mouth 2 (two) times daily., Disp: , Rfl:  .  Cholecalciferol (VITAMIN D-3) 5000 units TABS, Take 5,000 Units by mouth 2 (two) times daily., Disp: , Rfl:  .  diphenhydrAMINE (BENADRYL) 25 MG tablet, Take 25-50 mg by mouth every 6 (six) hours as needed for itching, allergies or sleep., Disp: , Rfl:  .  docusate sodium (COLACE) 100 MG capsule, Take 1 capsule (100 mg total) by mouth 2 (two) times daily as needed for mild constipation., Disp: 20 capsule, Rfl: 1 .  ECHINACEA-GOLDEN SEAL PO, Take 1 tablet by mouth 2 (two) times daily as needed (immune system boost). , Disp: , Rfl:  .  EPINEPHRINE 0.3 mg/0.3 mL IJ SOAJ injection, INJECT 0.3ML INTO THE MUSCLE ONCE AS DIRECTED, Disp: 2 each, Rfl: 0 .  hydrochlorothiazide (HYDRODIURIL) 25 MG tablet, Take 1 tablet (25 mg total) by mouth daily., Disp: 90 tablet, Rfl: 0 .  lisinopril (ZESTRIL) 10 MG tablet, Take 1 tablet (10 mg total) by mouth daily., Disp: 90 tablet, Rfl: 0 .  Magnesium 500 MG TABS, Take 500 mg by mouth at bedtime., Disp: , Rfl:  .  Melatonin 10 MG TABS, Take 1 tablet by mouth at bedtime. , Disp: , Rfl:  .  Multiple Vitamin (MULTIVITAMIN WITH MINERALS) TABS tablet, Take 1 tablet by mouth daily., Disp: , Rfl:  .  Multiple Vitamins-Minerals (ZINC PO), Take 500 mg by mouth daily as needed (immune system boost)., Disp: , Rfl:  .  mupirocin ointment (BACTROBAN) 2 %, Apply 1 application topically 2 (two) times daily. To affected area, Disp: 30 g, Rfl: 1 .  ondansetron (ZOFRAN) 4 MG tablet, Take 1-2 tablets (4-8 mg total) by mouth every 8 (eight) hours as needed for nausea or vomiting., Disp: 20 tablet, Rfl: 0 .  pregabalin (LYRICA) 75 MG capsule, Take  1 capsule (75 mg total) by mouth 2 (two) times daily., Disp: 60 capsule, Rfl: 3 .  Pseudoephedrine-APAP-DM (DAYQUIL PO), Take by mouth., Disp: , Rfl:  .  senna-docusate (SENOKOT-S) 8.6-50 MG tablet, Take 1 tablet by mouth at bedtime as needed for mild constipation., Disp: 15 tablet, Rfl: 0 .  sertraline (ZOLOFT) 100 MG tablet, Take 1.5 tablets (150 mg total) by mouth daily., Disp: 45 tablet, Rfl: 3 .  silver sulfADIAZINE (SILVADENE) 1 % cream, Apply 1 application topically 2 (two) times daily. (Patient taking differently: Apply 1 application topically 2 (two) times daily as needed (wound care). ), Disp: 50 g, Rfl: 0 .  St Johns Wort 300 MG CAPS, Take 900 mg by mouth 2 (two) times daily. , Disp: , Rfl:  .  VENTOLIN HFA 108 (90 Base) MCG/ACT inhaler, INHALE 2 PUFFS BY MOUTH EVERY 6 HOURS AS NEEDED, Disp: 18 each, Rfl: 1 .  levofloxacin (LEVAQUIN) 500 MG tablet, Take 1 tablet (500 mg total) by mouth daily. (Patient not taking: Reported on 06/21/2019), Disp: 7 tablet, Rfl: 0 .  predniSONE (DELTASONE) 10 MG tablet, 3 tabs by mouth x 3 days, 2 tabs by mouth x  2 days, 1 tab by mouth x 2 days and stop. (Patient not taking: Reported on 06/21/2019), Disp: 15 tablet, Rfl: 0  Allergies  Allergen Reactions  . Benzalkonium Chloride Anaphylaxis and Hives  . Neosporin [Neomycin-Bacitracin Zn-Polymyx] Anaphylaxis and Hives  . Polysporin [Bacitracin-Polymyxin B] Hives  . Bee Venom Hives and Swelling    Throat swelling  . Bupropion Hcl Other (See Comments)    angry  . Cephalexin Hives and Swelling  . Latex     Objective:  .Temp 99.6 F (37.6 C) (Oral)   Ht 5\' 7"  (1.702 m)   Wt (!) 369 lb (167.4 kg)   BMI 57.79 kg/m   VITALS: Per patient if applicable, see vitals. GENERAL: Alert, appears well and in no acute distress. HEENT: Atraumatic, conjunctiva clear, no obvious abnormalities on inspection of external nose and ears. NECK: Normal movements of the head and neck. CARDIOPULMONARY: No increased WOB.  Speaking in clear sentences. I:E ratio WNL.  MS: Moves all visible extremities without noticeable abnormality. PSYCH: Pleasant and cooperative, well-groomed. Speech normal rate and rhythm. Affect is appropriate. Insight and judgement are appropriate. Attention is focused, linear, and appropriate.  NEURO: CN grossly intact. Oriented as arrived to appointment on time with no prompting. Moves both UE equally.  SKIN: No obvious lesions, wounds, erythema, or cyanosis noted on face or hands.  Depression screen [facility] 2/9 03/25/2019 05/26/2018 05/12/2018  Decreased Interest 0 2 3  Down, Depressed, Hopeless 3 2 3   PHQ - 2 Score 3 4 6   Altered sleeping 1 3 3   Tired, decreased energy 3 3 3   Change in appetite 0 1 3  Feeling bad or failure about yourself  0 1 3  Trouble concentrating 0 2 3  Moving slowly or fidgety/restless 0 1 2  Suicidal thoughts 0 - 0  PHQ-9 Score 7 15 23   Difficult doing work/chores Somewhat difficult Somewhat difficult Very difficult     . COVID-19 Education: The signs and symptoms of COVID-19 were discussed with the patient and how to seek care for testing if needed. The importance of social distancing was discussed today. . Reviewed expectations re: course of current medical issues. . Discussed self-management of symptoms. . Outlined signs and symptoms indicating need for more acute intervention. . Patient verbalized understanding and all questions were answered. Marland Kitchen Health Maintenance issues including appropriate healthy diet, exercise, and smoking avoidance were discussed with patient. . See orders for this visit as documented in the electronic medical record.  Arnette Norris, MD  Records requested if needed. Time spent: 25 minutes, of which >50% was spent in obtaining information about her symptoms, reviewing her previous labs, evaluations, and treatments, counseling her about her condition (please see the discussed topics above), and developing a plan to further investigate it;  she had a number of questions which I addressed.   Lab Results  Component Value Date   WBC 9.1 08/06/2017   HGB 9.3 (L) 08/06/2017   HCT 27.2 (L) 08/06/2017   PLT  08/06/2017    296.0 Result may be falsely decreased due to platelet clumping.   GLUCOSE 142 (H) 05/12/2018   CHOL 210 (H) 12/24/2016   TRIG 150.0 (H) 12/24/2016   HDL 54.90 12/24/2016   LDLDIRECT 129.8 08/21/2010   LDLCALC 125 (H) 12/24/2016   ALT 52 (H) 05/12/2018   AST 38 (H) 05/12/2018   NA 139 05/12/2018   K 4.0 05/12/2018   CL 106 05/12/2018   CREATININE 0.67 05/12/2018   BUN 17 05/12/2018  CO2 26 05/12/2018   TSH 2.30 12/24/2016   HGBA1C 5.7 12/24/2016    Lab Results  Component Value Date   TSH 2.30 12/24/2016   Lab Results  Component Value Date   WBC 9.1 08/06/2017   HGB 9.3 (L) 08/06/2017   HCT 27.2 (L) 08/06/2017   MCV 93.7 08/06/2017   PLT  08/06/2017    296.0 Result may be falsely decreased due to platelet clumping.   Lab Results  Component Value Date   NA 139 05/12/2018   K 4.0 05/12/2018   CO2 26 05/12/2018   GLUCOSE 142 (H) 05/12/2018   BUN 17 05/12/2018   CREATININE 0.67 05/12/2018   BILITOT 1.0 05/12/2018   ALKPHOS 54 05/12/2018   AST 38 (H) 05/12/2018   ALT 52 (H) 05/12/2018   PROT 7.0 05/12/2018   ALBUMIN 4.1 05/12/2018   CALCIUM 9.1 05/12/2018   ANIONGAP 10 08/02/2017   GFR 99.94 05/12/2018   Lab Results  Component Value Date   CHOL 210 (H) 12/24/2016   Lab Results  Component Value Date   HDL 54.90 12/24/2016   Lab Results  Component Value Date   LDLCALC 125 (H) 12/24/2016   Lab Results  Component Value Date   TRIG 150.0 (H) 12/24/2016   Lab Results  Component Value Date   CHOLHDL 4 12/24/2016   Lab Results  Component Value Date   HGBA1C 5.7 12/24/2016       Assessment & Plan:   Problem List Items Addressed This Visit      Active Problems   Suspected COVID-19 virus infection - Primary       ACUTE VISIT, COVID-19  Subjective:   HPI: Patient  is in today for symptoms concerning for COVID-19. Please see prescreen note for history and ROS.  Patient Active Problem List   Diagnosis Date Noted  . Upper respiratory infection 03/25/2019  . Sinus infection 10/26/2018  . Suspected COVID-19 virus infection 10/08/2018  . Influenza-like illness 09/28/2018  . Fibromyalgia 05/12/2018  . Edema 05/12/2018  . BMI 60.0-69.9, adult ([place]) 05/12/2018  . DDD (degenerative disc disease), thoracolumbar 01/06/2018  . DVT (deep venous thrombosis) ([place]) 08/29/2017  . Leukocytosis 08/06/2017  . Humerus fracture 08/01/2017  . CPAP (continuous positive airway pressure) dependence 08/01/2017  . Asthma 08/01/2017  . HTN (hypertension) 06/24/2017  . Depression 11/07/2015  . Systolic murmur 0000000  . Morbid obesity ([place]) 10/26/2014  . Insomnia 06/21/2014  . Anxiety 06/21/2014  . Spinal stenosis at L4-L5 level 06/02/2014  . POLYCYSTIC OVARIAN DISEASE 06/19/2010   She reports that she quit smoking about 29 years ago. She has never used smokeless tobacco. She reports that she does not drink alcohol or use drugs.  Current Outpatient Medications:  .  acetaminophen (TYLENOL) 650 MG CR tablet, Take 650 mg by mouth every 8 (eight) hours as needed for pain., Disp: , Rfl:  .  Alum & Mag Hydroxide-Simeth (ANTACID ANTI-GAS PO), Take 1 tablet by mouth daily as needed (for gas)., Disp: , Rfl:  .  Ascorbic Acid (VITAMIN C) 1000 MG tablet, Take 1,000 mg by mouth 2 (two) times daily., Disp: , Rfl:  .  aspirin EC 81 MG tablet, Take 81 mg by mouth daily., Disp: , Rfl:  .  aspirin-acetaminophen-caffeine (EXCEDRIN MIGRAINE) 250-250-65 MG tablet, Take 2 tablets by mouth 2 (two) times daily as needed for headache or migraine., Disp: , Rfl:  .  b complex vitamins tablet, Take 1 tablet by mouth daily., Disp: , Rfl:  .  bismuth subsalicylate (PEPTO-BISMOL) 262 MG/15ML suspension, Take 30 mLs by mouth 4 (four) times daily -  before meals and at bedtime., Disp: 360 mL, Rfl:  0 .  Calcium Carbonate-Vitamin D (CALCIUM-D PO), Take 1,200 mg by mouth 2 (two) times daily., Disp: , Rfl:  .  Cholecalciferol (VITAMIN D-3) 5000 units TABS, Take 5,000 Units by mouth 2 (two) times daily., Disp: , Rfl:  .  diphenhydrAMINE (BENADRYL) 25 MG tablet, Take 25-50 mg by mouth every 6 (six) hours as needed for itching, allergies or sleep., Disp: , Rfl:  .  docusate sodium (COLACE) 100 MG capsule, Take 1 capsule (100 mg total) by mouth 2 (two) times daily as needed for mild constipation., Disp: 20 capsule, Rfl: 1 .  ECHINACEA-GOLDEN SEAL PO, Take 1 tablet by mouth 2 (two) times daily as needed (immune system boost). , Disp: , Rfl:  .  EPINEPHRINE 0.3 mg/0.3 mL IJ SOAJ injection, INJECT 0.3ML INTO THE MUSCLE ONCE AS DIRECTED, Disp: 2 each, Rfl: 0 .  hydrochlorothiazide (HYDRODIURIL) 25 MG tablet, Take 1 tablet (25 mg total) by mouth daily., Disp: 90 tablet, Rfl: 0 .  lisinopril (ZESTRIL) 10 MG tablet, Take 1 tablet (10 mg total) by mouth daily., Disp: 90 tablet, Rfl: 0 .  Magnesium 500 MG TABS, Take 500 mg by mouth at bedtime., Disp: , Rfl:  .  Melatonin 10 MG TABS, Take 1 tablet by mouth at bedtime. , Disp: , Rfl:  .  Multiple Vitamin (MULTIVITAMIN WITH MINERALS) TABS tablet, Take 1 tablet by mouth daily., Disp: , Rfl:  .  Multiple Vitamins-Minerals (ZINC PO), Take 500 mg by mouth daily as needed (immune system boost)., Disp: , Rfl:  .  mupirocin ointment (BACTROBAN) 2 %, Apply 1 application topically 2 (two) times daily. To affected area, Disp: 30 g, Rfl: 1 .  ondansetron (ZOFRAN) 4 MG tablet, Take 1-2 tablets (4-8 mg total) by mouth every 8 (eight) hours as needed for nausea or vomiting., Disp: 20 tablet, Rfl: 0 .  pregabalin (LYRICA) 75 MG capsule, Take 1 capsule (75 mg total) by mouth 2 (two) times daily., Disp: 60 capsule, Rfl: 3 .  Pseudoephedrine-APAP-DM (DAYQUIL PO), Take by mouth., Disp: , Rfl:  .  senna-docusate (SENOKOT-S) 8.6-50 MG tablet, Take 1 tablet by mouth at bedtime as  needed for mild constipation., Disp: 15 tablet, Rfl: 0 .  sertraline (ZOLOFT) 100 MG tablet, Take 1.5 tablets (150 mg total) by mouth daily., Disp: 45 tablet, Rfl: 3 .  silver sulfADIAZINE (SILVADENE) 1 % cream, Apply 1 application topically 2 (two) times daily. (Patient taking differently: Apply 1 application topically 2 (two) times daily as needed (wound care). ), Disp: 50 g, Rfl: 0 .  St Johns Wort 300 MG CAPS, Take 900 mg by mouth 2 (two) times daily. , Disp: , Rfl:  .  VENTOLIN HFA 108 (90 Base) MCG/ACT inhaler, INHALE 2 PUFFS BY MOUTH EVERY 6 HOURS AS NEEDED, Disp: 18 each, Rfl: 1 .  levofloxacin (LEVAQUIN) 500 MG tablet, Take 1 tablet (500 mg total) by mouth daily. (Patient not taking: Reported on 06/21/2019), Disp: 7 tablet, Rfl: 0 .  predniSONE (DELTASONE) 10 MG tablet, 3 tabs by mouth x 3 days, 2 tabs by mouth x 2 days, 1 tab by mouth x 2 days and stop. (Patient not taking: Reported on 06/21/2019), Disp: 15 tablet, Rfl: 0  Allergies  Allergen Reactions  . Benzalkonium Chloride Anaphylaxis and Hives  . Neosporin [Neomycin-Bacitracin Zn-Polymyx] Anaphylaxis and Hives  . Polysporin [Bacitracin-Polymyxin B]  Hives  . Bee Venom Hives and Swelling    Throat swelling  . Bupropion Hcl Other (See Comments)    angry  . Cephalexin Hives and Swelling  . Latex       Results for orders placed or performed in visit on 05/21/18  POCT Influenza A/B  Result Value Ref Range   Influenza A, POC Negative Negative   Influenza B, POC Negative Negative    Assessment & Plan:   1. Suspected COVID-19 virus infection     Low to moderate suspicion of COVID-19: Patient has a respiratory illness without signs of acute distress or respiratory compromise at this time. This is likely a viral infection, which can come from a number of respiratory viruses.  . Reviewed expectations re: course of current medical issues. . Discussed self-management of symptoms. . Outlined signs and symptoms indicating need  for more acute intervention. . Patient verbalized understanding and all questions were answered.  Advised:   The COVID-19 Community Testing sites will begin testing BY APPOINTMENT ONLY starting the week of December 14th (see go-live dates by location below). . In addition, starting the week of December 14th we will move INDOORS for testing, (see locations Below).  . Community testing site appointment hours will be 8 a.m. to 3:30 p.m.  If you/your practice has a patient that needs an appointment, please have the patient text "COVID" to [phone], OR they can log on to [email or web address] to easily make an on-line appointment.  If you have a patient who does not have access to a smart phone or PC, you or the patient can call [phone] to get assistance.  If you have a patient that needs a test and appointments are full, please send a message to the [place] or call [phone]. A provider may request that we overbook additional tests per day.  [facility] Location: [address] [place], [place] [address]  [place])         Arnette Norris, MD 06/21/2019           I am having Jamice M. Falero maintain her [employer] Wort, ECHINACEA-GOLDEN SEAL PO, Melatonin, Alum & Mag Hydroxide-Simeth (ANTACID ANTI-GAS PO), docusate sodium, silver sulfADIAZINE, multivitamin with minerals, vitamin C, Vitamin D-3, Magnesium, b complex vitamins, Calcium Carbonate-Vitamin D (CALCIUM-D PO), Multiple Vitamins-Minerals (ZINC PO), aspirin EC, diphenhydrAMINE, aspirin-acetaminophen-caffeine, senna-docusate, mupirocin ointment, Ventolin HFA, pregabalin, ondansetron, bismuth subsalicylate, acetaminophen, Pseudoephedrine-APAP-DM (DAYQUIL PO), levofloxacin, predniSONE, sertraline, lisinopril, hydrochlorothiazide, and EPINEPHrine.  No orders of the defined types were placed in this encounter.    Arnette Norris, MD

## 2019-06-21 ENCOUNTER — Other Ambulatory Visit: Payer: Self-pay

## 2019-06-21 ENCOUNTER — Ambulatory Visit (INDEPENDENT_AMBULATORY_CARE_PROVIDER_SITE_OTHER): Payer: Self-pay | Admitting: Family Medicine

## 2019-06-21 ENCOUNTER — Encounter: Payer: Self-pay | Admitting: Family Medicine

## 2019-06-21 VITALS — Temp 99.6°F | Ht 67.0 in | Wt 369.0 lb

## 2019-06-21 DIAGNOSIS — Z20822 Contact with and (suspected) exposure to covid-19: Secondary | ICD-10-CM

## 2019-06-21 MED ORDER — HYDROCODONE-HOMATROPINE 5-1.5 MG/5ML PO SYRP: 5.0000 mL | ORAL_SOLUTION | Freq: Three times a day (TID) | ORAL | 0 refills | Status: DC | PRN

## 2019-06-21 NOTE — Assessment & Plan Note (Addendum)
  ACUTE VISIT, COVID-19  Subjective:   HPI: Patient is in today for symptoms concerning for COVID-19. Please see prescreen note for history and ROS.     Results for orders placed or performed in visit on 05/21/18  POCT Influenza A/B  Result Value Ref Range   Influenza A, POC Negative Negative   Influenza B, POC Negative Negative    Assessment & Plan:   1. Suspected COVID-19 virus infection     Low to moderate suspicion of COVID-19: Patient has a respiratory illness without signs of acute distress or respiratory compromise at this time. This is likely a viral infection, which can come from a number of respiratory viruses.  . Reviewed expectations re: course of current medical issues. . Discussed self-management of symptoms. . Outlined signs and symptoms indicating need for more acute intervention. . Patient verbalized understanding and all questions were answered.  Advised:   The COVID-19 Community Testing sites will begin testing BY APPOINTMENT ONLY starting the week of December 14th (see go-live dates by location below). . In addition, starting the week of December 14th we will move INDOORS for testing, (see locations Below).  . Community testing site appointment hours will be 8 a.m. to 3:30 p.m.  If you/your practice has a patient that needs an appointment, please have the patient text "COVID" to [phone], OR they can log on to [email or web address] to easily make an on-line appointment.  If you have a patient who does not have access to a smart phone or PC, you or the patient can call [phone] to get assistance.  If you have a patient that needs a test and appointments are full, please send a message to the [place] or call [phone]. A provider may request that we overbook additional tests per day.  [facility] Location: [address], [place], [place] [address]  [place]         Arnette Norris,  MD 06/21/2019

## 2019-06-22 ENCOUNTER — Encounter (INDEPENDENT_AMBULATORY_CARE_PROVIDER_SITE_OTHER): Payer: Self-pay

## 2019-06-22 ENCOUNTER — Ambulatory Visit: Payer: BC Managed Care – PPO | Attending: Internal Medicine

## 2019-06-22 DIAGNOSIS — Z20822 Contact with and (suspected) exposure to covid-19: Secondary | ICD-10-CM | POA: Insufficient documentation

## 2019-06-23 ENCOUNTER — Encounter (INDEPENDENT_AMBULATORY_CARE_PROVIDER_SITE_OTHER): Payer: Self-pay

## 2019-06-23 LAB — NOVEL CORONAVIRUS, NAA: SARS-CoV-2, NAA: NOT DETECTED

## 2019-06-24 ENCOUNTER — Encounter (INDEPENDENT_AMBULATORY_CARE_PROVIDER_SITE_OTHER): Payer: Self-pay

## 2019-06-24 ENCOUNTER — Telehealth: Payer: Self-pay

## 2019-06-24 NOTE — Telephone Encounter (Signed)
Patient states that she has had 2 episodes of diarrhea today. Patient has been advise on as follows:   If diarrhea remains the same: encourage patient to drink oral fluids and bland foods.   Avoid alcohol, spicy foods, caffeine or fatty foods that could make diarrhea worse.   Continue to monitor for signs of dehydration (increased thirst decreased urine output, yellow urine, dry skin, headache or dizziness).   Advise patient to try OTC medication (Imodium, kaopectate, Pepto-Bismol) as per manufacturer's instructions.   If worsening diarrhea occurs and becomes severe (6-7 bowel movements a day): notify PCP   If diarrhea last greater than 7 days: notify PCP   IF SIGNS OF DEHYDRATION OCCUR (INCREASED THIRST, DECREASED URINE OUTPUT, YELLOW URINE, DRY SKIN, HEADACHE OR DIZZINESS) ADVISE PATIENT TO CALL 911 AND SEEK TREATMENT IN THE ED

## 2019-06-27 ENCOUNTER — Encounter (INDEPENDENT_AMBULATORY_CARE_PROVIDER_SITE_OTHER): Payer: Self-pay

## 2019-06-29 ENCOUNTER — Encounter (INDEPENDENT_AMBULATORY_CARE_PROVIDER_SITE_OTHER): Payer: Self-pay

## 2019-06-30 ENCOUNTER — Encounter (INDEPENDENT_AMBULATORY_CARE_PROVIDER_SITE_OTHER): Payer: Self-pay

## 2019-06-30 ENCOUNTER — Encounter: Payer: Self-pay | Admitting: Family Medicine

## 2019-07-01 ENCOUNTER — Encounter (INDEPENDENT_AMBULATORY_CARE_PROVIDER_SITE_OTHER): Payer: Self-pay

## 2019-07-01 ENCOUNTER — Telehealth: Payer: Self-pay | Admitting: Family Medicine

## 2019-07-01 MED ORDER — LEVOFLOXACIN 500 MG PO TABS: 500.0000 mg | ORAL_TABLET | Freq: Every day | ORAL | 0 refills | Status: DC

## 2019-07-01 NOTE — Telephone Encounter (Signed)
TA-Plz see pt 2nd response/states cough has worsened with green-yellow sputum/denies any other Sx/plz advise/thx dmf

## 2019-07-01 NOTE — Addendum Note (Signed)
Addended by: Lucille Passy on: 07/01/2019 12:57 PM   Modules accepted: Orders

## 2019-07-01 NOTE — Telephone Encounter (Signed)
Sent pt a message informing via MyChart/thx dmf

## 2019-07-01 NOTE — Telephone Encounter (Signed)
If she is not short of breath, we can cover her with possible covid related PNA with an outpatient antibiotic, otherwise she needs to be seen emergently.  I will send in rx for levaquin .  Please keep me updated.

## 2019-07-01 NOTE — Telephone Encounter (Signed)
Calling pt in response to Danielle Irwin, pt states she is having worsening cough and now sputum is green and yellow, denies any other changes. Will route note to PCP practice.

## 2019-07-02 ENCOUNTER — Encounter (INDEPENDENT_AMBULATORY_CARE_PROVIDER_SITE_OTHER): Payer: Self-pay

## 2019-07-03 ENCOUNTER — Encounter (INDEPENDENT_AMBULATORY_CARE_PROVIDER_SITE_OTHER): Payer: Self-pay

## 2019-07-04 ENCOUNTER — Encounter (INDEPENDENT_AMBULATORY_CARE_PROVIDER_SITE_OTHER): Payer: Self-pay

## 2020-05-30 ENCOUNTER — Ambulatory Visit
Admission: RE | Admit: 2020-05-30 | Discharge: 2020-05-30 | Disposition: A | Discharge status: Home / Self Care | Payer: BC Managed Care – PPO | Source: Ambulatory Visit | Attending: Family Medicine | Admitting: Family Medicine

## 2020-05-30 ENCOUNTER — Other Ambulatory Visit: Payer: Self-pay

## 2020-05-30 VITALS — BP 140/81 | HR 92 | Temp 98.4°F | Resp 20 | Ht 66.5 in | Wt 360.0 lb

## 2020-05-30 DIAGNOSIS — R059 Cough, unspecified: Secondary | ICD-10-CM

## 2020-05-30 DIAGNOSIS — U071 COVID-19: Secondary | ICD-10-CM | POA: Diagnosis not present

## 2020-05-30 MED ORDER — HYDROCODONE-HOMATROPINE 5-1.5 MG/5ML PO SYRP: 5.0000 mL | ORAL_SOLUTION | Freq: Four times a day (QID) | ORAL | 0 refills | Status: DC | PRN

## 2020-05-30 MED ORDER — ONDANSETRON 4 MG PO TBDP: 4.0000 mg | ORAL_TABLET | Freq: Three times a day (TID) | ORAL | 0 refills | Status: DC | PRN

## 2020-05-30 NOTE — Discharge Instructions (Addendum)
Medications as prescribed. Albuterol as needed Work note provided Follow up as needed for continued or worsening symptoms

## 2020-05-30 NOTE — ED Triage Notes (Signed)
Patient c/o non-productive cough, nausea, and SOB x 2 weeks.  Patient endorses a fever of 100.8 F at home.   Patient has tried Zofran w/ some relief of symptoms.   Patient had a COVID positive test on 12/6.

## 2020-05-30 NOTE — ED Provider Notes (Signed)
Roderic Palau    CSN: [id number] Arrival date & time: 05/30/20  1039      History   Chief Complaint Chief Complaint  Patient presents with   Nausea   Cough    HPI Danielle Irwin is a 49 y.o. female.   Patient is a 49 year old female that presents today with continued cough, nausea, mild shortness of breath and intermittent fevers.  She was diagnosed with Covid on 12/6.  Has been treated with prednisone, albuterol, taking over-the-counter cough medication with some relief of her symptoms.  She still has this lingering cough worse at night.  Mostly dry.  Was taking Zofran for nausea which helped but she has run out.  Patient here with concerns of returning to work due to consistent symptoms.     Past Medical History:  Diagnosis Date   Asthma    Depression    Family history of adverse reaction to anesthesia    FATHER WAS SLOW TO WAKE UP   Headache    OCCASIONAL MIGRAINES   HNP (herniated nucleus pulposus), lumbar    Hx of epistaxis    Insomnia    OA (osteoarthritis)    Obesity    PCOS (polycystic ovarian syndrome)    Sensitive skin    Vitamin D deficiency     Patient Active Problem List   Diagnosis Date Noted   Upper respiratory infection 03/25/2019   Sinus infection 10/26/2018   Suspected COVID-19 virus infection 10/08/2018   Influenza-like illness 09/28/2018   Fibromyalgia 05/12/2018   Edema 05/12/2018   BMI 60.0-69.9, adult ([place]) 05/12/2018   DDD (degenerative disc disease), thoracolumbar 01/06/2018   DVT (deep venous thrombosis) ([place]) 08/29/2017   Leukocytosis 08/06/2017   Humerus fracture 08/01/2017   CPAP (continuous positive airway pressure) dependence 08/01/2017   Asthma 08/01/2017   HTN (hypertension) 06/24/2017   Depression 57/84/6962   Systolic murmur 95/28/4132   Morbid obesity ([place]) 10/26/2014   Insomnia 06/21/2014   Anxiety 06/21/2014   Spinal stenosis at L4-L5 level 06/02/2014   POLYCYSTIC OVARIAN  DISEASE 06/19/2010    Past Surgical History:  Procedure Laterality Date   LUMBAR LAMINECTOMY/DECOMPRESSION MICRODISCECTOMY Right 06/02/2014   Procedure: MICRO LUMBAR DECOMPRESSION L4 - L5 ON THE RIGHT 1 LEVEL;  Surgeon: Johnn Hai, MD;  Location: [facility];  Service: Orthopedics;  Laterality: Right;   ORIF HUMERUS FRACTURE Left 08/01/2017   Procedure: OPEN REDUCTION INTERNAL FIXATION (ORIF) PROXIMAL HUMERUS FRACTURE;  Surgeon: Shona Needles, MD;  Location: [facility];  Service: Orthopedics;  Laterality: Left;   WISDOM TOOTH EXTRACTION      OB History   No obstetric history on file.      Home Medications    Prior to Admission medications   Medication Sig Start Date End Date Taking? Authorizing Provider  acetaminophen (TYLENOL) 650 MG CR tablet Take 650 mg by mouth every 8 (eight) hours as needed for pain.    [provider]  Alum & Mag Hydroxide-Simeth (ANTACID ANTI-GAS PO) Take 1 tablet by mouth daily as needed (for gas).    [provider]  Ascorbic Acid (VITAMIN C) 1000 MG tablet Take 1,000 mg by mouth 2 (two) times daily.    [provider]  aspirin EC 81 MG tablet Take 81 mg by mouth daily.    [provider]  aspirin-acetaminophen-caffeine (EXCEDRIN MIGRAINE) [phone] MG tablet Take 2 tablets by mouth 2 (two) times daily as needed for headache or migraine.    [provider]  b  complex vitamins tablet Take 1 tablet by mouth daily.    [provider]  bismuth subsalicylate (PEPTO-BISMOL) 262 MG/15ML suspension Take 30 mLs by mouth 4 (four) times daily -  before meals and at bedtime. 08/27/18   Nche, Charlene Brooke, NP  Calcium Carbonate-Vitamin D (CALCIUM-D PO) Take 1,200 mg by mouth 2 (two) times daily.    [provider]  Cholecalciferol (VITAMIN D-3) 5000 units TABS Take 5,000 Units by mouth 2 (two) times daily.    [provider]  diphenhydrAMINE (BENADRYL) 25 MG tablet Take 25-50 mg by mouth every 6 (six)  hours as needed for itching, allergies or sleep.    [provider]  docusate sodium (COLACE) 100 MG capsule Take 1 capsule (100 mg total) by mouth 2 (two) times daily as needed for mild constipation. 06/02/14   Susa Day, MD  [facility] SEAL PO Take 1 tablet by mouth 2 (two) times daily as needed (immune system boost).     [provider]  EPINEPHRINE 0.3 mg/0.3 mL IJ SOAJ injection INJECT 0.3ML INTO THE MUSCLE ONCE AS DIRECTED 06/07/19   Lucille Passy, MD  hydrochlorothiazide (HYDRODIURIL) 25 MG tablet Take 1 tablet (25 mg total) by mouth daily. 06/01/19   Lucille Passy, MD  HYDROcodone-homatropine [facility]) 5-1.5 MG/5ML syrup Take 5 mLs by mouth every 6 (six) hours as needed for cough. 05/30/20   Loura Halt A, NP  lisinopril (ZESTRIL) 10 MG tablet Take 1 tablet (10 mg total) by mouth daily. 06/01/19   Lucille Passy, MD  Magnesium 500 MG TABS Take 500 mg by mouth at bedtime.    [provider]  Melatonin 10 MG TABS Take 1 tablet by mouth at bedtime.    [provider]  Multiple Vitamin (MULTIVITAMIN WITH MINERALS) TABS tablet Take 1 tablet by mouth daily.    [provider]  Multiple Vitamins-Minerals (ZINC PO) Take 500 mg by mouth daily as needed (immune system boost).    [provider]  mupirocin ointment (BACTROBAN) 2 % Apply 1 application topically 2 (two) times daily. To affected area 01/06/18   Lucille Passy, MD  ondansetron (ZOFRAN ODT) 4 MG disintegrating tablet Take 1 tablet (4 mg total) by mouth every 8 (eight) hours as needed for nausea or vomiting. 05/30/20   Loura Halt A, NP  pregabalin (LYRICA) 75 MG capsule Take 1 capsule (75 mg total) by mouth 2 (two) times daily. 07/23/18   Lucille Passy, MD  senna-docusate (SENOKOT-S) 8.6-50 MG tablet Take 1 tablet by mouth at bedtime as needed for mild constipation. 08/03/17   Alma Friendly, MD  sertraline (ZOLOFT) 100 MG tablet Take 1.5 tablets (150 mg total) by mouth daily.  06/01/19   Lucille Passy, MD  silver sulfADIAZINE (SILVADENE) 1 % cream Apply 1 application topically 2 (two) times daily. Patient taking differently: Apply 1 application topically 2 (two) times daily as needed (wound care). 11/23/14   Pleas Koch, NP  [place] Wort 300 MG CAPS Take 900 mg by mouth 2 (two) times daily.     [provider]  VENTOLIN HFA 108 (90 Base) MCG/ACT inhaler INHALE 2 PUFFS BY MOUTH EVERY 6 HOURS AS NEEDED 04/09/18   Lucille Passy, MD    Family History Family History  Problem Relation Age of Onset   Heart attack Father 62   Breast cancer Maternal Aunt 46    Social History Social History   Tobacco Use   Smoking status: Former  Smoker    Quit date: 05/26/1990    Years since quitting: 30.0   Smokeless tobacco: Never Used  Vaping Use   Vaping Use: Never used  Substance Use Topics   Alcohol use: No    Alcohol/week: 0.0 standard drinks   Drug use: No     Allergies   Benzalkonium chloride, Neosporin [neomycin-bacitracin zn-polymyx], Polysporin [bacitracin-polymyxin b], Bee venom, Bupropion hcl, Cephalexin, and Latex   Review of Systems Review of Systems   Physical Exam Triage Vital Signs ED Triage Vitals  Enc Vitals Group     BP 05/30/20 1059 140/81     Pulse Rate 05/30/20 1059 92     Resp 05/30/20 1059 20     Temp 05/30/20 1059 98.4 F (36.9 C)     Temp Source 05/30/20 1059 Oral     SpO2 05/30/20 1059 96 %     Weight 05/30/20 1054 (!) 360 lb (163.3 kg)     Height 05/30/20 1054 5' 6.5" (1.689 m)     Head Circumference --      Peak Flow --      Pain Score 05/30/20 1054 2     Pain Loc --      Pain Edu? --      Excl. in [place]? --    No data found.  Updated Vital Signs BP 140/81 (BP Location: Right Arm)    Pulse 92    Temp 98.4 F (36.9 C) (Oral)    Resp 20    Ht 5' 6.5" (1.689 m)    Wt (!) 360 lb (163.3 kg)    LMP 07/12/2016    SpO2 96%    BMI 57.24 kg/m   Visual Acuity Right Eye Distance:   Left Eye Distance:    Bilateral Distance:    Right Eye Near:   Left Eye Near:    Bilateral Near:     Physical Exam Vitals and nursing note reviewed.  Constitutional:      General: She is not in acute distress.    Appearance: Normal appearance. She is not ill-appearing, toxic-appearing or diaphoretic.  HENT:     Head: Normocephalic.     Nose: Nose normal.  Eyes:     Conjunctiva/sclera: Conjunctivae normal.  Cardiovascular:     Rate and Rhythm: Normal rate and regular rhythm.  Pulmonary:     Effort: Pulmonary effort is normal.     Breath sounds: Normal breath sounds.  Musculoskeletal:        General: Normal range of motion.     Cervical back: Normal range of motion.  Skin:    General: Skin is warm and dry.     Findings: No rash.  Neurological:     Mental Status: She is alert.  Psychiatric:        Mood and Affect: Mood normal.      UC Treatments / Results  Labs (all labs ordered are listed, but only abnormal results are displayed) Labs Reviewed - No data to display  EKG   Radiology No results found.  Procedures Procedures (including critical care time)  Medications Ordered in UC Medications - No data to display  Initial Impression / Assessment and Plan / UC Course  I have reviewed the triage vital signs and the nursing notes.  Pertinent labs & imaging results that were available during my care of the patient were reviewed by me and considered in my medical decision making (see chart for details).     COVID-19 Patient still with persistent  cough and some lingering symptoms from COVID-19. Otherwise exam benign.  Oxygen saturations are normal.  She is in no distress. Will prescribe some stronger cough medication to help her rest at night.  Zofran for nausea, vomiting as needed.  She can continue the albuterol as needed.  Work note provided to return next week to give her a few more days to rest. Follow up as needed for continued or worsening symptoms  Final Clinical  Impressions(s) / UC Diagnoses   Final diagnoses:  COVID-19  Cough     Discharge Instructions     Medications as prescribed. Albuterol as needed Work note provided Follow up as needed for continued or worsening symptoms     ED Prescriptions    Medication Sig Dispense Auth. Provider   ondansetron (ZOFRAN ODT) 4 MG disintegrating tablet Take 1 tablet (4 mg total) by mouth every 8 (eight) hours as needed for nausea or vomiting. 20 tablet Gwynne Kemnitz A, NP   HYDROcodone-homatropine (HYCODAN) 5-1.5 MG/5ML syrup Take 5 mLs by mouth every 6 (six) hours as needed for cough. 120 mL Derris Millan A, NP     PDMP not reviewed this encounter.   Orvan July, NP 05/30/20 1213

## 2020-07-05 ENCOUNTER — Ambulatory Visit: Payer: Self-pay | Admitting: Adult Health

## 2020-09-12 DIAGNOSIS — M5416 Radiculopathy, lumbar region: Secondary | ICD-10-CM | POA: Diagnosis not present

## 2020-12-05 ENCOUNTER — Ambulatory Visit: Payer: BC Managed Care – PPO | Admitting: Internal Medicine

## 2020-12-05 ENCOUNTER — Other Ambulatory Visit: Payer: Self-pay

## 2020-12-05 ENCOUNTER — Encounter: Payer: Self-pay | Admitting: Internal Medicine

## 2020-12-05 VITALS — BP 140/85 | HR 87 | Ht 66.0 in | Wt 384.5 lb

## 2020-12-05 DIAGNOSIS — M797 Fibromyalgia: Secondary | ICD-10-CM

## 2020-12-05 DIAGNOSIS — I82431 Acute embolism and thrombosis of right popliteal vein: Secondary | ICD-10-CM

## 2020-12-05 DIAGNOSIS — J4541 Moderate persistent asthma with (acute) exacerbation: Secondary | ICD-10-CM

## 2020-12-05 DIAGNOSIS — F5101 Primary insomnia: Secondary | ICD-10-CM

## 2020-12-05 DIAGNOSIS — I10 Essential (primary) hypertension: Secondary | ICD-10-CM

## 2020-12-05 DIAGNOSIS — F32A Depression, unspecified: Secondary | ICD-10-CM

## 2020-12-05 DIAGNOSIS — E282 Polycystic ovarian syndrome: Secondary | ICD-10-CM

## 2020-12-05 DIAGNOSIS — Z6841 Body Mass Index (BMI) 40.0 and over, adult: Secondary | ICD-10-CM

## 2020-12-05 DIAGNOSIS — M5135 Other intervertebral disc degeneration, thoracolumbar region: Secondary | ICD-10-CM

## 2020-12-05 DIAGNOSIS — F419 Anxiety disorder, unspecified: Secondary | ICD-10-CM

## 2020-12-05 MED ORDER — SERTRALINE HCL 100 MG PO TABS: 150.0000 mg | ORAL_TABLET | Freq: Every day | ORAL | 3 refills | Status: DC

## 2020-12-05 MED ORDER — LISINOPRIL 10 MG PO TABS: 10.0000 mg | ORAL_TABLET | Freq: Every day | ORAL | 0 refills | Status: DC

## 2020-12-05 MED ORDER — FUROSEMIDE 20 MG PO TABS: 20.0000 mg | ORAL_TABLET | Freq: Every day | ORAL | 3 refills | Status: AC

## 2020-12-05 NOTE — Assessment & Plan Note (Signed)
Patient does not have any pain in the legs or back

## 2020-12-05 NOTE — Assessment & Plan Note (Signed)

## 2020-12-05 NOTE — Assessment & Plan Note (Signed)
Started on Zoloft 

## 2020-12-05 NOTE — Assessment & Plan Note (Signed)
-   Patient experiencing high levels of anxiety.  - Encouraged patient to engage in relaxing activities like yoga, meditation, journaling, going for a walk, or participating in a hobby.  - Encouraged patient to reach out to trusted friends or family members about recent struggles, Patient was advised to read A book, how to stop worrying and start living, it is good book to read to control  the stress  

## 2020-12-05 NOTE — Assessment & Plan Note (Signed)
Resolved

## 2020-12-05 NOTE — Assessment & Plan Note (Signed)
She uses CPAP machine

## 2020-12-05 NOTE — Assessment & Plan Note (Signed)
Refer to the weight loss clinic

## 2020-12-05 NOTE — Assessment & Plan Note (Signed)
Refer to OB/GYN 

## 2020-12-05 NOTE — Progress Notes (Signed)
Established Patient Office Visit  Subjective:  Patient ID: Danielle Irwin, female    DOB: 1970-07-27  Age: 50 y.o. MRN: [id number]  CC:  Chief Complaint  Patient presents with   New Patient (Initial Visit)    HPI  Danielle Irwin presents for new pt  Past Medical History:  Diagnosis Date   Asthma    Depression    Family history of adverse reaction to anesthesia    FATHER WAS SLOW TO WAKE UP   Headache    OCCASIONAL MIGRAINES   HNP (herniated nucleus pulposus), lumbar    Hx of epistaxis    Insomnia    OA (osteoarthritis)    Obesity    PCOS (polycystic ovarian syndrome)    Sensitive skin    Vitamin D deficiency     Past Surgical History:  Procedure Laterality Date   LUMBAR LAMINECTOMY/DECOMPRESSION MICRODISCECTOMY Right 06/02/2014   Procedure: MICRO LUMBAR DECOMPRESSION L4 - L5 ON THE RIGHT 1 LEVEL;  Surgeon: Johnn Hai, MD;  Location: [facility];  Service: Orthopedics;  Laterality: Right;   ORIF HUMERUS FRACTURE Left 08/01/2017   Procedure: OPEN REDUCTION INTERNAL FIXATION (ORIF) PROXIMAL HUMERUS FRACTURE;  Surgeon: Shona Needles, MD;  Location: [facility];  Service: Orthopedics;  Laterality: Left;   WISDOM TOOTH EXTRACTION      Family History  Problem Relation Age of Onset   Heart attack Father 44   Breast cancer Maternal Aunt 39    Social History   Socioeconomic History   Marital status: Single    Spouse name: Not on file   Number of children: Not on file   Years of education: Not on file   Highest education level: Not on file  Occupational History   Not on file  Tobacco Use   Smoking status: Former    Pack years: 0.00    Types: Cigarettes    Quit date: 05/26/1990    Years since quitting: 30.5   Smokeless tobacco: Never  Vaping Use   Vaping Use: Never used  Substance and Sexual Activity   Alcohol use: No    Alcohol/week: 0.0 standard drinks   Drug use: No   Sexual activity: Not on file  Other Topics Concern   Not on file  Social History Narrative    Not on file   Social Determinants of Health   Financial Resource Strain: Not on file  Food Insecurity: Not on file  Transportation Needs: Not on file  Physical Activity: Not on file  Stress: Not on file  Social Connections: Not on file  Intimate Partner Violence: Not on file     Current Outpatient Medications:    acetaminophen (TYLENOL) 650 MG CR tablet, Take 650 mg by mouth every 8 (eight) hours as needed for pain., Disp: , Rfl:    Alum & Mag Hydroxide-Simeth (ANTACID ANTI-GAS PO), Take 1 tablet by mouth daily as needed (for gas)., Disp: , Rfl:    Ascorbic Acid (VITAMIN C) 1000 MG tablet, Take 1,000 mg by mouth 2 (two) times daily., Disp: , Rfl:    aspirin-acetaminophen-caffeine (EXCEDRIN MIGRAINE) 250-250-65 MG tablet, Take 2 tablets by mouth 2 (two) times daily as needed for headache or migraine., Disp: , Rfl:    b complex vitamins tablet, Take 1 tablet by mouth daily., Disp: , Rfl:    bismuth subsalicylate (PEPTO-BISMOL) 262 MG/15ML suspension, Take 30 mLs by mouth 4 (four) times daily -  before meals and at bedtime., Disp: 360 mL, Rfl: 0  Calcium Carbonate-Vitamin D (CALCIUM-D PO), Take 1,200 mg by mouth 2 (two) times daily., Disp: , Rfl:    diphenhydrAMINE (BENADRYL) 25 MG tablet, Take 25-50 mg by mouth every 6 (six) hours as needed for itching, allergies or sleep., Disp: , Rfl:    ECHINACEA-GOLDEN SEAL PO, Take 1 tablet by mouth 2 (two) times daily as needed (immune system boost). , Disp: , Rfl:    EPINEPHRINE 0.3 mg/0.3 mL IJ SOAJ injection, INJECT 0.3ML INTO THE MUSCLE ONCE AS DIRECTED, Disp: 2 each, Rfl: 0   furosemide (LASIX) 20 MG tablet, Take 1 tablet (20 mg total) by mouth daily., Disp: 30 tablet, Rfl: 3   Magnesium 500 MG TABS, Take 500 mg by mouth at bedtime., Disp: , Rfl:    Melatonin 10 MG TABS, Take 1 tablet by mouth at bedtime., Disp: , Rfl:    Multiple Vitamin (MULTIVITAMIN WITH MINERALS) TABS tablet, Take 1 tablet by mouth daily., Disp: , Rfl:    Multiple  Vitamins-Minerals (ZINC PO), Take 500 mg by mouth daily as needed (immune system boost)., Disp: , Rfl:    ondansetron (ZOFRAN ODT) 4 MG disintegrating tablet, Take 1 tablet (4 mg total) by mouth every 8 (eight) hours as needed for nausea or vomiting., Disp: 20 tablet, Rfl: 0   lisinopril (ZESTRIL) 10 MG tablet, Take 1 tablet (10 mg total) by mouth daily., Disp: 90 tablet, Rfl: 0   sertraline (ZOLOFT) 100 MG tablet, Take 1.5 tablets (150 mg total) by mouth daily., Disp: 45 tablet, Rfl: 3   Allergies  Allergen Reactions   Benzalkonium Chloride Anaphylaxis and Hives   Neosporin [Neomycin-Bacitracin Zn-Polymyx] Anaphylaxis and Hives   Polysporin [Bacitracin-Polymyxin B] Hives   Bee Venom Hives and Swelling    Throat swelling   Bupropion Hcl Other (See Comments)    angry   Cephalexin Hives and Swelling   Latex     ROS Review of Systems  Constitutional:  Positive for fatigue. Negative for chills.  HENT: Negative.    Eyes: Negative.  Negative for itching.  Respiratory:  Positive for shortness of breath. Negative for cough, choking, chest tightness and wheezing.   Cardiovascular:  Positive for leg swelling. Negative for chest pain.  Gastrointestinal:  Positive for abdominal distention.  Endocrine: Negative.  Negative for polydipsia and polyphagia.  Genitourinary: Negative.   Musculoskeletal: Negative.   Skin: Negative.   Allergic/Immunologic: Negative.   Neurological: Negative.  Negative for dizziness and headaches.  Hematological: Negative.   Psychiatric/Behavioral: Negative.  Negative for agitation.   All other systems reviewed and are negative.    Objective:    Physical Exam Cardiovascular:     Rate and Rhythm: Regular rhythm. No extrasystoles are present.    Pulses:          Carotid pulses are 1+ on the right side and 1+ on the left side.      Radial pulses are 1+ on the right side and 1+ on the left side.       Femoral pulses are 1+ on the right side and 1+ on the left  side.      Popliteal pulses are 1+ on the right side and 1+ on the left side.       Dorsalis pedis pulses are 1+ on the right side and 1+ on the left side.       Posterior tibial pulses are 1+ on the right side and 1+ on the left side.     Heart sounds: Heart sounds are distant.  Pulmonary:     Effort: No tachypnea, accessory muscle usage or respiratory distress.     Breath sounds: No decreased air movement. No decreased breath sounds, wheezing or rhonchi.  Abdominal:     General: Abdomen is protuberant. Bowel sounds are normal. There is distension.     Palpations: There is no shifting dullness, fluid wave, hepatomegaly, splenomegaly, mass or pulsatile mass.  Musculoskeletal:     Right lower leg: 1+ Edema present.     Left lower leg: 1+ Edema present.    BP 140/85   Pulse 87   Ht 5\' 6"  (1.676 m)   Wt (!) 384 lb 8 oz (174.4 kg)   LMP 07/12/2016   BMI 62.06 kg/m  Wt Readings from Last 3 Encounters:  12/05/20 (!) 384 lb 8 oz (174.4 kg)  05/30/20 (!) 360 lb (163.3 kg)  06/21/19 (!) 369 lb (167.4 kg)     Health Maintenance Due  Topic Date Due   COVID-19 Vaccine (1) Never done   Hepatitis C Screening  Never done   COLONOSCOPY (Pts 45-28yrs Insurance coverage will need to be confirmed)  Never done   PAP SMEAR-Modifier  04/11/2019   MAMMOGRAM  Never done   Zoster Vaccines- Shingrix (1 of 2) Never done    There are no preventive care reminders to display for this patient.  Lab Results  Component Value Date   TSH 2.30 12/24/2016   Lab Results  Component Value Date   WBC 9.1 08/06/2017   HGB 9.3 (L) 08/06/2017   HCT 27.2 (L) 08/06/2017   MCV 93.7 08/06/2017   PLT  08/06/2017    296.0 Result may be falsely decreased due to platelet clumping.   Lab Results  Component Value Date   NA 139 05/12/2018   K 4.0 05/12/2018   CO2 26 05/12/2018   GLUCOSE 142 (H) 05/12/2018   BUN 17 05/12/2018   CREATININE 0.67 05/12/2018   BILITOT 1.0 05/12/2018   ALKPHOS 54 05/12/2018    AST 38 (H) 05/12/2018   ALT 52 (H) 05/12/2018   PROT 7.0 05/12/2018   ALBUMIN 4.1 05/12/2018   CALCIUM 9.1 05/12/2018   ANIONGAP 10 08/02/2017   GFR 99.94 05/12/2018   Lab Results  Component Value Date   CHOL 210 (H) 12/24/2016   Lab Results  Component Value Date   HDL 54.90 12/24/2016   Lab Results  Component Value Date   LDLCALC 125 (H) 12/24/2016   Lab Results  Component Value Date   TRIG 150.0 (H) 12/24/2016   Lab Results  Component Value Date   CHOLHDL 4 12/24/2016   Lab Results  Component Value Date   HGBA1C 5.7 12/24/2016      Assessment & Plan:   Problem List Items Addressed This Visit       Cardiovascular and Mediastinum   HTN (hypertension) - Primary     Patient denies any chest pain or shortness of breath there is no history of palpitation or paroxysmal nocturnal dyspnea   patient was advised to follow low-salt low-cholesterol diet    ideally I want to keep systolic blood pressure below 130 mmHg, patient was asked to check blood pressure one times a week and give me a report on that.  Patient will be follow-up in 3 months  or earlier as needed, patient will call me back for any change in the cardiovascular symptoms Patient was advised to buy a book from local bookstore concerning blood pressure and read several chapters  every day.  This will be supplemented by some of the material we will give him from the office.  Patient should also utilize other resources like YouTube and Internet to learn more about the blood pressure and the diet.       Relevant Medications   lisinopril (ZESTRIL) 10 MG tablet   furosemide (LASIX) 20 MG tablet   DVT (deep venous thrombosis) ([facility])    Resolved       Relevant Medications   lisinopril (ZESTRIL) 10 MG tablet   furosemide (LASIX) 20 MG tablet     Respiratory   Asthma    No wheezing was detected today         Endocrine   POLYCYSTIC OVARIAN DISEASE    Refer to OB/GYN         Musculoskeletal and  Integument   DDD (degenerative disc disease), thoracolumbar    Patient does not have any pain in the legs or back         Other   Insomnia    She uses CPAP machine       Anxiety    - Patient experiencing high levels of anxiety.  - Encouraged patient to engage in relaxing activities like yoga, meditation, journaling, going for a walk, or participating in a hobby.  - Encouraged patient to reach out to trusted friends or family members about recent struggles, Patient was advised to read A book, how to stop worrying and start living, it is good book to read to control  the stress        Relevant Medications   sertraline (ZOLOFT) 100 MG tablet   Morbid obesity ([place])   Depression    Started on Zoloft       Relevant Medications   sertraline (ZOLOFT) 100 MG tablet   Fibromyalgia   Relevant Medications   sertraline (ZOLOFT) 100 MG tablet   BMI 60.0-69.9, adult ([place])    Refer to the weight loss clinic        Meds ordered this encounter  Medications   lisinopril (ZESTRIL) 10 MG tablet    Sig: Take 1 tablet (10 mg total) by mouth daily.    Dispense:  90 tablet    Refill:  0   sertraline (ZOLOFT) 100 MG tablet    Sig: Take 1.5 tablets (150 mg total) by mouth daily.    Dispense:  45 tablet    Refill:  3   furosemide (LASIX) 20 MG tablet    Sig: Take 1 tablet (20 mg total) by mouth daily.    Dispense:  30 tablet    Refill:  3    Follow-up: No follow-ups on file.    Cletis Athens, MD

## 2020-12-05 NOTE — Assessment & Plan Note (Signed)
No wheezing was detected today

## 2021-01-02 ENCOUNTER — Encounter: Payer: Self-pay | Admitting: Internal Medicine

## 2021-01-02 ENCOUNTER — Other Ambulatory Visit: Payer: Self-pay

## 2021-01-02 ENCOUNTER — Ambulatory Visit: Payer: BC Managed Care – PPO | Admitting: Internal Medicine

## 2021-01-02 VITALS — BP 163/82 | HR 85 | Ht 66.5 in | Wt 383.7 lb

## 2021-01-02 DIAGNOSIS — I10 Essential (primary) hypertension: Secondary | ICD-10-CM | POA: Diagnosis not present

## 2021-01-02 DIAGNOSIS — R609 Edema, unspecified: Secondary | ICD-10-CM

## 2021-01-02 DIAGNOSIS — M48061 Spinal stenosis, lumbar region without neurogenic claudication: Secondary | ICD-10-CM | POA: Diagnosis not present

## 2021-01-02 DIAGNOSIS — Z9989 Dependence on other enabling machines and devices: Secondary | ICD-10-CM

## 2021-01-02 DIAGNOSIS — J4541 Moderate persistent asthma with (acute) exacerbation: Secondary | ICD-10-CM

## 2021-01-02 LAB — ELECTROLYTE PANEL
CO2: 22 mmol/L (ref 20–32)
Chloride: 106 mmol/L (ref 98–110)
Potassium: 4.3 mmol/L (ref 3.5–5.3)
Sodium: 138 mmol/L (ref 135–146)

## 2021-01-02 NOTE — Assessment & Plan Note (Signed)

## 2021-01-02 NOTE — Assessment & Plan Note (Signed)
Continue to use CPAP 

## 2021-01-02 NOTE — Addendum Note (Signed)
Addended by: Alois Cliche on: 01/02/2021 11:55 AM   Modules accepted: Orders

## 2021-01-02 NOTE — Assessment & Plan Note (Signed)
Stable at the present time. 

## 2021-01-02 NOTE — Progress Notes (Signed)
Established Patient Office Visit  Subjective:  Patient ID: Danielle Irwin, female    DOB: 1970-07-08  Age: 50 y.o. MRN: [id number]  CC:  Chief Complaint  Patient presents with   Hypertension    Hypertension Associated symptoms include shortness of breath. Pertinent negatives include no chest pain or headaches.   Danielle Irwin presents for new pt  Past Medical History:  Diagnosis Date   Asthma    Depression    Family history of adverse reaction to anesthesia    FATHER WAS SLOW TO WAKE UP   Headache    OCCASIONAL MIGRAINES   HNP (herniated nucleus pulposus), lumbar    Hx of epistaxis    Insomnia    OA (osteoarthritis)    Obesity    PCOS (polycystic ovarian syndrome)    Sensitive skin    Vitamin D deficiency     Past Surgical History:  Procedure Laterality Date   LUMBAR LAMINECTOMY/DECOMPRESSION MICRODISCECTOMY Right 06/02/2014   Procedure: MICRO LUMBAR DECOMPRESSION L4 - L5 ON THE RIGHT 1 LEVEL;  Surgeon: Johnn Hai, MD;  Location: [facility];  Service: Orthopedics;  Laterality: Right;   ORIF HUMERUS FRACTURE Left 08/01/2017   Procedure: OPEN REDUCTION INTERNAL FIXATION (ORIF) PROXIMAL HUMERUS FRACTURE;  Surgeon: Shona Needles, MD;  Location: [facility];  Service: Orthopedics;  Laterality: Left;   WISDOM TOOTH EXTRACTION      Family History  Problem Relation Age of Onset   Heart attack Father 77   Breast cancer Maternal Aunt 48    Social History   Socioeconomic History   Marital status: Single    Spouse name: Not on file   Number of children: Not on file   Years of education: Not on file   Highest education level: Not on file  Occupational History   Not on file  Tobacco Use   Smoking status: Former    Types: Cigarettes    Quit date: 05/26/1990    Years since quitting: 30.6   Smokeless tobacco: Never  Vaping Use   Vaping Use: Never used  Substance and Sexual Activity   Alcohol use: No    Alcohol/week: 0.0 standard drinks   Drug use: No   Sexual  activity: Not on file  Other Topics Concern   Not on file  Social History Narrative   Not on file   Social Determinants of Health   Financial Resource Strain: Not on file  Food Insecurity: Not on file  Transportation Needs: Not on file  Physical Activity: Not on file  Stress: Not on file  Social Connections: Not on file  Intimate Partner Violence: Not on file     Current Outpatient Medications:    acetaminophen (TYLENOL) 650 MG CR tablet, Take 650 mg by mouth every 8 (eight) hours as needed for pain., Disp: , Rfl:    Alum & Mag Hydroxide-Simeth (ANTACID ANTI-GAS PO), Take 1 tablet by mouth daily as needed (for gas)., Disp: , Rfl:    Ascorbic Acid (VITAMIN C) 1000 MG tablet, Take 1,000 mg by mouth 2 (two) times daily., Disp: , Rfl:    aspirin-acetaminophen-caffeine (EXCEDRIN MIGRAINE) 250-250-65 MG tablet, Take 2 tablets by mouth 2 (two) times daily as needed for headache or migraine., Disp: , Rfl:    b complex vitamins tablet, Take 1 tablet by mouth daily., Disp: , Rfl:    bismuth subsalicylate (PEPTO-BISMOL) 262 MG/15ML suspension, Take 30 mLs by mouth 4 (four) times daily -  before meals and at bedtime., Disp:  360 mL, Rfl: 0   Calcium Carbonate-Vitamin D (CALCIUM-D PO), Take 1,200 mg by mouth 2 (two) times daily., Disp: , Rfl:    diphenhydrAMINE (BENADRYL) 25 MG tablet, Take 25-50 mg by mouth every 6 (six) hours as needed for itching, allergies or sleep., Disp: , Rfl:    ECHINACEA-GOLDEN SEAL PO, Take 1 tablet by mouth 2 (two) times daily as needed (immune system boost). , Disp: , Rfl:    EPINEPHRINE 0.3 mg/0.3 mL IJ SOAJ injection, INJECT 0.3ML INTO THE MUSCLE ONCE AS DIRECTED, Disp: 2 each, Rfl: 0   furosemide (LASIX) 20 MG tablet, Take 1 tablet (20 mg total) by mouth daily., Disp: 30 tablet, Rfl: 3   lisinopril (ZESTRIL) 10 MG tablet, Take 1 tablet (10 mg total) by mouth daily., Disp: 90 tablet, Rfl: 0   Magnesium 500 MG TABS, Take 500 mg by mouth at bedtime., Disp: , Rfl:     Melatonin 10 MG TABS, Take 1 tablet by mouth at bedtime., Disp: , Rfl:    Multiple Vitamin (MULTIVITAMIN WITH MINERALS) TABS tablet, Take 1 tablet by mouth daily., Disp: , Rfl:    Multiple Vitamins-Minerals (ZINC PO), Take 500 mg by mouth daily as needed (immune system boost)., Disp: , Rfl:    ondansetron (ZOFRAN ODT) 4 MG disintegrating tablet, Take 1 tablet (4 mg total) by mouth every 8 (eight) hours as needed for nausea or vomiting., Disp: 20 tablet, Rfl: 0   sertraline (ZOLOFT) 100 MG tablet, Take 1.5 tablets (150 mg total) by mouth daily., Disp: 45 tablet, Rfl: 3   Allergies  Allergen Reactions   Benzalkonium Chloride Anaphylaxis and Hives   Neosporin [Neomycin-Bacitracin Zn-Polymyx] Anaphylaxis and Hives   Polysporin [Bacitracin-Polymyxin B] Hives   Bee Venom Hives and Swelling    Throat swelling   Bupropion Hcl Other (See Comments)    angry   Cephalexin Hives and Swelling   Latex     ROS Review of Systems  Constitutional:  Positive for fatigue. Negative for chills.  HENT: Negative.    Eyes: Negative.  Negative for itching.  Respiratory:  Positive for shortness of breath. Negative for cough, choking, chest tightness and wheezing.   Cardiovascular:  Positive for leg swelling. Negative for chest pain.  Gastrointestinal:  Positive for abdominal distention.  Endocrine: Negative.  Negative for polydipsia and polyphagia.  Genitourinary: Negative.   Musculoskeletal: Negative.   Skin: Negative.   Allergic/Immunologic: Negative.   Neurological: Negative.  Negative for dizziness and headaches.  Hematological: Negative.   Psychiatric/Behavioral: Negative.  Negative for agitation.   All other systems reviewed and are negative.    Objective:    Physical Exam Neck:     Comments: Patient has painful movement of the neck.  Flexion extension external rotation are painful she was also found to have crepitus on movement of the neck.  Pain does not go down to the all fingers.  We will get  an x-ray and followed by an MRI because she seemed to have significant disease.  I offered to use neck brace. Cardiovascular:     Rate and Rhythm: Regular rhythm. No extrasystoles are present.    Pulses:          Carotid pulses are 1+ on the right side and 1+ on the left side.      Radial pulses are 1+ on the right side and 1+ on the left side.       Femoral pulses are 1+ on the right side and 1+ on the left  side.      Popliteal pulses are 1+ on the right side and 1+ on the left side.       Dorsalis pedis pulses are 1+ on the right side and 1+ on the left side.       Posterior tibial pulses are 1+ on the right side and 1+ on the left side.     Heart sounds: Heart sounds are distant.  Pulmonary:     Effort: No tachypnea, accessory muscle usage or respiratory distress.     Breath sounds: No decreased air movement. No decreased breath sounds, wheezing or rhonchi.  Abdominal:     General: Abdomen is protuberant. Bowel sounds are normal. There is distension.     Palpations: There is no shifting dullness, fluid wave, hepatomegaly, splenomegaly, mass or pulsatile mass.  Musculoskeletal:     Cervical back: No erythema, signs of trauma or torticollis.     Right lower leg: 1+ Edema present.     Left lower leg: 1+ Edema present.    BP (!) 163/82   Pulse 85   Ht 5' 6.5" (1.689 m)   Wt (!) 383 lb 11.2 oz (174 kg)   LMP 07/12/2016   BMI 61.00 kg/m  Wt Readings from Last 3 Encounters:  01/02/21 (!) 383 lb 11.2 oz (174 kg)  12/05/20 (!) 384 lb 8 oz (174.4 kg)  05/30/20 (!) 360 lb (163.3 kg)     Health Maintenance Due  Topic Date Due   COVID-19 Vaccine (1) Never done   Hepatitis C Screening  Never done   COLONOSCOPY (Pts 45-51yr Insurance coverage will need to be confirmed)  Never done   PAP SMEAR-Modifier  04/11/2019   MAMMOGRAM  Never done   Zoster Vaccines- Shingrix (1 of 2) Never done    There are no preventive care reminders to display for this patient.  Lab Results  Component  Value Date   TSH 2.30 12/24/2016   Lab Results  Component Value Date   WBC 9.1 08/06/2017   HGB 9.3 (L) 08/06/2017   HCT 27.2 (L) 08/06/2017   MCV 93.7 08/06/2017   PLT  08/06/2017    296.0 Result may be falsely decreased due to platelet clumping.   Lab Results  Component Value Date   NA 139 05/12/2018   K 4.0 05/12/2018   CO2 26 05/12/2018   GLUCOSE 142 (H) 05/12/2018   BUN 17 05/12/2018   CREATININE 0.67 05/12/2018   BILITOT 1.0 05/12/2018   ALKPHOS 54 05/12/2018   AST 38 (H) 05/12/2018   ALT 52 (H) 05/12/2018   PROT 7.0 05/12/2018   ALBUMIN 4.1 05/12/2018   CALCIUM 9.1 05/12/2018   ANIONGAP 10 08/02/2017   GFR 99.94 05/12/2018   Lab Results  Component Value Date   CHOL 210 (H) 12/24/2016   Lab Results  Component Value Date   HDL 54.90 12/24/2016   Lab Results  Component Value Date   LDLCALC 125 (H) 12/24/2016   Lab Results  Component Value Date   TRIG 150.0 (H) 12/24/2016   Lab Results  Component Value Date   CHOLHDL 4 12/24/2016   Lab Results  Component Value Date   HGBA1C 5.7 12/24/2016      Assessment & Plan:   Problem List Items Addressed This Visit       Cardiovascular and Mediastinum   HTN (hypertension)     Patient denies any chest pain or shortness of breath there is no history of palpitation or paroxysmal nocturnal dyspnea  patient was advised to follow low-salt low-cholesterol diet    ideally I want to keep systolic blood pressure below 130 mmHg, patient was asked to check blood pressure one times a week and give me a report on that.  Patient will be follow-up in 3 months  or earlier as needed, patient will call me back for any change in the cardiovascular symptoms Patient was advised to buy a book from local bookstore concerning blood pressure and read several chapters  every day.  This will be supplemented by some of the material we will give him from the office.  Patient should also utilize other resources like YouTube and Internet  to learn more about the blood pressure and the diet.  We will check electrolyte         Respiratory   Asthma    Stable at the present time         Other   Spinal stenosis at L4-L5 level    Patient has pain in his neck it does not radiate to arm.  We will get an MRI and x-ray of the neck       Morbid obesity ([place])    - I encouraged the patient to lose weight.  - I educated them on making healthy dietary choices including eating more fruits and vegetables and less fried foods. - I encouraged the patient to exercise more, and educated on the benefits of exercise including weight loss, diabetes prevention, and hypertension prevention.   Dietary counseling with a registered dietician  Referral to a weight management support group (e.g. Weight Watchers, Overeaters Anonymous)  If your BMI is greater than 29 or you have gained more than 15 pounds you should work on weight loss.  Attend a healthy cooking class        CPAP (continuous positive airway pressure) dependence    Continue to use CPAP       Edema - Primary   Relevant Orders   Electrolyte panel   No orders of the defined types were placed in this encounter.   Follow-up: No follow-ups on file.    Cletis Athens, MD

## 2021-01-02 NOTE — Addendum Note (Signed)
Addended by: Alois Cliche on: 01/02/2021 12:03 PM   Modules accepted: Orders

## 2021-01-02 NOTE — Assessment & Plan Note (Signed)
Patient has pain in his neck it does not radiate to arm.  We will get an MRI and x-ray of the neck

## 2021-01-02 NOTE — Assessment & Plan Note (Signed)
Patient denies any chest pain or shortness of breath there is no history of palpitation or paroxysmal nocturnal dyspnea   patient was advised to follow low-salt low-cholesterol diet    ideally I want to keep systolic blood pressure below 130 mmHg, patient was asked to check blood pressure one times a week and give me a report on that.  Patient will be follow-up in 3 months  or earlier as needed, patient will call me back for any change in the cardiovascular symptoms Patient was advised to buy a book from local bookstore concerning blood pressure and read several chapters  every day.  This will be supplemented by some of the material we will give him from the office.  Patient should also utilize other resources like YouTube and Internet to learn more about the blood pressure and the diet.  We will check electrolyte

## 2021-01-18 ENCOUNTER — Other Ambulatory Visit: Payer: Self-pay | Admitting: *Deleted

## 2021-01-18 DIAGNOSIS — Z1231 Encounter for screening mammogram for malignant neoplasm of breast: Secondary | ICD-10-CM

## 2021-02-01 DIAGNOSIS — S39012A Strain of muscle, fascia and tendon of lower back, initial encounter: Secondary | ICD-10-CM | POA: Insufficient documentation

## 2021-02-01 DIAGNOSIS — M545 Low back pain, unspecified: Secondary | ICD-10-CM | POA: Insufficient documentation

## 2021-02-01 DIAGNOSIS — S161XXA Strain of muscle, fascia and tendon at neck level, initial encounter: Secondary | ICD-10-CM | POA: Insufficient documentation

## 2021-02-01 DIAGNOSIS — M797 Fibromyalgia: Secondary | ICD-10-CM | POA: Diagnosis not present

## 2021-02-01 DIAGNOSIS — M542 Cervicalgia: Secondary | ICD-10-CM | POA: Diagnosis not present

## 2022-02-16 ENCOUNTER — Other Ambulatory Visit: Payer: Self-pay | Admitting: *Deleted

## 2022-02-16 DIAGNOSIS — Z1231 Encounter for screening mammogram for malignant neoplasm of breast: Secondary | ICD-10-CM

## 2022-07-09 ENCOUNTER — Telehealth: Payer: BC Managed Care – PPO | Admitting: Family Medicine

## 2022-07-09 DIAGNOSIS — U071 COVID-19: Secondary | ICD-10-CM | POA: Diagnosis not present

## 2022-07-09 MED ORDER — PSEUDOEPH-BROMPHEN-DM 30-2-10 MG/5ML PO SYRP
5.0000 mL | ORAL_SOLUTION | Freq: Four times a day (QID) | ORAL | 0 refills | Status: DC | PRN
Start: 2022-07-09 — End: 2022-07-30

## 2022-07-09 MED ORDER — MOLNUPIRAVIR EUA 200MG CAPSULE: 4.0000 | ORAL_CAPSULE | Freq: Two times a day (BID) | ORAL | 0 refills | Status: AC

## 2022-07-09 MED ORDER — BENZONATATE 100 MG PO CAPS: 100.0000 mg | ORAL_CAPSULE | Freq: Three times a day (TID) | ORAL | 0 refills | Status: DC | PRN

## 2022-07-09 NOTE — Progress Notes (Signed)
Virtual Visit Consent   Danielle Irwin, you are scheduled for a virtual visit with a [facility] provider today. Just as with appointments in the office, your consent must be obtained to participate. Your consent will be active for this visit and any virtual visit you may have with one of our providers in the next 365 days. If you have a MyChart account, a copy of this consent can be sent to you electronically.  As this is a virtual visit, video technology does not allow for your provider to perform a traditional examination. This may limit your provider's ability to fully assess your condition. If your provider identifies any concerns that need to be evaluated in person or the need to arrange testing (such as labs, EKG, etc.), we will make arrangements to do so. Although advances in technology are sophisticated, we cannot ensure that it will always work on either your end or our end. If the connection with a video visit is poor, the visit may have to be switched to a telephone visit. With either a video or telephone visit, we are not always able to ensure that we have a secure connection.  By engaging in this virtual visit, you consent to the provision of healthcare and authorize for your insurance to be billed (if applicable) for the services provided during this visit. Depending on your insurance coverage, you may receive a charge related to this service.  I need to obtain your verbal consent now. Are you willing to proceed with your visit today? Danielle Irwin has provided verbal consent on 07/09/2022 for a virtual visit (video or telephone). [name], NP  Date: 07/09/2022 9:58 AM  Virtual Visit via Video Note   I, [name], connected with  Danielle Irwin  ([id number], [birth year]) on 07/09/22 at 10:00 AM EST by a video-enabled telemedicine application and verified that I am speaking with the correct person using two identifiers.  Location: Patient: Virtual Visit Location Patient:  Home Provider: Virtual Visit Location Provider: Home Office   I discussed the limitations of evaluation and management by telemedicine and the availability of in person appointments. The patient expressed understanding and agreed to proceed.    History of Present Illness: Danielle Irwin is a 52 y.o. who identifies as a female who was assigned female at birth, and is being seen today for covid tested positive on home test 07/08/2022- Sunday 07/07/2022- felt like was getting a head cold. Stayed home from work. Associated symptoms body aches, fevers- T max 101, chills, diarrhea, cough, sore throat, sense of taste is off, congestion, headache. Does have mild shortness of breath- history of asthma using inhaler which is helping. Denies chest pain. Modifying factors- day quil gel tabs, Delsym   Vaccines- no   Problems:  Patient Active Problem List   Diagnosis Date Noted   Upper respiratory infection 03/25/2019   Sinus infection 10/26/2018   Suspected COVID-19 virus infection 10/08/2018   Influenza-like illness 09/28/2018   Fibromyalgia 05/12/2018   Edema 05/12/2018   BMI 60.0-69.9, adult ([place]) 05/12/2018   DDD (degenerative disc disease), thoracolumbar 01/06/2018   DVT (deep venous thrombosis) ([place]) 08/29/2017   Leukocytosis 08/06/2017   Humerus fracture 08/01/2017   CPAP (continuous positive airway pressure) dependence 08/01/2017   Asthma 08/01/2017   HTN (hypertension) 06/24/2017   Depression 60/73/7106   Systolic murmur 26/94/8546   Morbid obesity ([place]) 10/26/2014   Insomnia 06/21/2014   Anxiety 06/21/2014   Spinal stenosis at L4-L5 level 06/02/2014  POLYCYSTIC OVARIAN DISEASE 06/19/2010    Allergies:  Allergies  Allergen Reactions   Benzalkonium Chloride Anaphylaxis and Hives   Neosporin [Neomycin-Bacitracin Zn-Polymyx] Anaphylaxis and Hives   Polysporin [Bacitracin-Polymyxin B] Hives   Bee Venom Hives and Swelling    Throat swelling   Bupropion Hcl Other (See Comments)     angry   Cephalexin Hives and Swelling   Latex    Medications:  Current Outpatient Medications:    acetaminophen (TYLENOL) 650 MG CR tablet, Take 650 mg by mouth every 8 (eight) hours as needed for pain., Disp: , Rfl:    Alum & Mag Hydroxide-Simeth (ANTACID ANTI-GAS PO), Take 1 tablet by mouth daily as needed (for gas)., Disp: , Rfl:    Ascorbic Acid (VITAMIN C) 1000 MG tablet, Take 1,000 mg by mouth 2 (two) times daily., Disp: , Rfl:    aspirin-acetaminophen-caffeine (EXCEDRIN MIGRAINE) 250-250-65 MG tablet, Take 2 tablets by mouth 2 (two) times daily as needed for headache or migraine., Disp: , Rfl:    b complex vitamins tablet, Take 1 tablet by mouth daily., Disp: , Rfl:    bismuth subsalicylate (PEPTO-BISMOL) 262 MG/15ML suspension, Take 30 mLs by mouth 4 (four) times daily -  before meals and at bedtime., Disp: 360 mL, Rfl: 0   Calcium Carbonate-Vitamin D (CALCIUM-D PO), Take 1,200 mg by mouth 2 (two) times daily., Disp: , Rfl:    diphenhydrAMINE (BENADRYL) 25 MG tablet, Take 25-50 mg by mouth every 6 (six) hours as needed for itching, allergies or sleep., Disp: , Rfl:    ECHINACEA-GOLDEN SEAL PO, Take 1 tablet by mouth 2 (two) times daily as needed (immune system boost). , Disp: , Rfl:    EPINEPHRINE 0.3 mg/0.3 mL IJ SOAJ injection, INJECT 0.3ML INTO THE MUSCLE ONCE AS DIRECTED, Disp: 2 each, Rfl: 0   furosemide (LASIX) 20 MG tablet, Take 1 tablet (20 mg total) by mouth daily., Disp: 30 tablet, Rfl: 3   lisinopril (ZESTRIL) 10 MG tablet, Take 1 tablet (10 mg total) by mouth daily., Disp: 90 tablet, Rfl: 0   Magnesium 500 MG TABS, Take 500 mg by mouth at bedtime., Disp: , Rfl:    Melatonin 10 MG TABS, Take 1 tablet by mouth at bedtime., Disp: , Rfl:    Multiple Vitamin (MULTIVITAMIN WITH MINERALS) TABS tablet, Take 1 tablet by mouth daily., Disp: , Rfl:    Multiple Vitamins-Minerals (ZINC PO), Take 500 mg by mouth daily as needed (immune system boost)., Disp: , Rfl:    ondansetron (ZOFRAN  ODT) 4 MG disintegrating tablet, Take 1 tablet (4 mg total) by mouth every 8 (eight) hours as needed for nausea or vomiting., Disp: 20 tablet, Rfl: 0   sertraline (ZOLOFT) 100 MG tablet, Take 1.5 tablets (150 mg total) by mouth daily., Disp: 45 tablet, Rfl: 3  Observations/Objective: Patient is well-developed, well-nourished in no acute distress.  Resting comfortably  at home.  Head is normocephalic, atraumatic.  No labored breathing.  Speech is clear and coherent with logical content.  Patient is alert and oriented at baseline.  Cough  Assessment and Plan: 1. COVID-19  - molnupiravir EUA (LAGEVRIO) 200 mg CAPS capsule; Take 4 capsules (800 mg total) by mouth 2 (two) times daily for 5 days.  Dispense: 40 capsule; Refill: 0 - brompheniramine-pseudoephedrine-DM 30-2-10 MG/5ML syrup; Take 5 mLs by mouth 4 (four) times daily as needed.  Dispense: 120 mL; Refill: 0 - benzonatate (TESSALON) 100 MG capsule; Take 1 capsule (100 mg total) by mouth 3 (three) times  daily as needed for cough.  Dispense: 20 capsule; Refill: 0  - Continue OTC symptomatic management of choice - Will send OTC vitamins and supplement information through [facility] - Take as prescribed - Push fluids - Rest as needed - Discussed return precautions and when to seek in-person evaluation, sent via [facility] as well   Reviewed side effects, risks and benefits of medication.    Patient acknowledged agreement and understanding of the plan.   Past Medical, Surgical, Social History, Allergies, and Medications have been Reviewed.    Follow Up Instructions: I discussed the assessment and treatment plan with the patient. The patient was provided an opportunity to ask questions and all were answered. The patient agreed with the plan and demonstrated an understanding of the instructions.  A copy of instructions were sent to the patient via MyChart unless otherwise noted below.     The patient was advised to call back or seek an in-person  evaluation if the symptoms worsen or if the condition fails to improve as anticipated.  Time:  I spent 11 minutes with the patient via telehealth technology discussing the above problems/concerns.    [name], NP

## 2022-07-09 NOTE — Patient Instructions (Addendum)
Danielle Irwin, thank you for joining [name], NP for today's virtual visit.  While this provider is not your primary care provider (PCP), if your PCP is located in our provider database this encounter information will be shared with them immediately following your visit.   [place] account gives you access to today's visit and all your visits, tests, and labs performed at [facility] " click here if you don't have a [facility] account or go to [email or web address]  Consent: (Patient) Danielle Irwin provided verbal consent for this virtual visit at the beginning of the encounter.  Current Medications:  Current Outpatient Medications:    acetaminophen (TYLENOL) 650 MG CR tablet, Take 650 mg by mouth every 8 (eight) hours as needed for pain., Disp: , Rfl:    Alum & Mag Hydroxide-Simeth (ANTACID ANTI-GAS PO), Take 1 tablet by mouth daily as needed (for gas)., Disp: , Rfl:    Ascorbic Acid (VITAMIN C) 1000 MG tablet, Take 1,000 mg by mouth 2 (two) times daily., Disp: , Rfl:    aspirin-acetaminophen-caffeine (EXCEDRIN MIGRAINE) 250-250-65 MG tablet, Take 2 tablets by mouth 2 (two) times daily as needed for headache or migraine., Disp: , Rfl:    b complex vitamins tablet, Take 1 tablet by mouth daily., Disp: , Rfl:    bismuth subsalicylate (PEPTO-BISMOL) 262 MG/15ML suspension, Take 30 mLs by mouth 4 (four) times daily -  before meals and at bedtime., Disp: 360 mL, Rfl: 0   Calcium Carbonate-Vitamin D (CALCIUM-D PO), Take 1,200 mg by mouth 2 (two) times daily., Disp: , Rfl:    diphenhydrAMINE (BENADRYL) 25 MG tablet, Take 25-50 mg by mouth every 6 (six) hours as needed for itching, allergies or sleep., Disp: , Rfl:    ECHINACEA-GOLDEN SEAL PO, Take 1 tablet by mouth 2 (two) times daily as needed (immune system boost). , Disp: , Rfl:    EPINEPHRINE 0.3 mg/0.3 mL IJ SOAJ injection, INJECT 0.3ML INTO THE MUSCLE ONCE AS DIRECTED, Disp: 2 each, Rfl: 0    furosemide (LASIX) 20 MG tablet, Take 1 tablet (20 mg total) by mouth daily., Disp: 30 tablet, Rfl: 3   lisinopril (ZESTRIL) 10 MG tablet, Take 1 tablet (10 mg total) by mouth daily., Disp: 90 tablet, Rfl: 0   Magnesium 500 MG TABS, Take 500 mg by mouth at bedtime., Disp: , Rfl:    Melatonin 10 MG TABS, Take 1 tablet by mouth at bedtime., Disp: , Rfl:    Multiple Vitamin (MULTIVITAMIN WITH MINERALS) TABS tablet, Take 1 tablet by mouth daily., Disp: , Rfl:    Multiple Vitamins-Minerals (ZINC PO), Take 500 mg by mouth daily as needed (immune system boost)., Disp: , Rfl:    ondansetron (ZOFRAN ODT) 4 MG disintegrating tablet, Take 1 tablet (4 mg total) by mouth every 8 (eight) hours as needed for nausea or vomiting., Disp: 20 tablet, Rfl: 0   sertraline (ZOLOFT) 100 MG tablet, Take 1.5 tablets (150 mg total) by mouth daily., Disp: 45 tablet, Rfl: 3   Medications ordered in this encounter:  No orders of the defined types were placed in this encounter.    *If you need refills on other medications prior to your next appointment, please contact your pharmacy*  Follow-Up: Call back or seek an in-person evaluation if the symptoms worsen or if the condition fails to improve as anticipated.  [place] [phone]  Care Instructions:  - Continue OTC symptomatic management of choice - Take as  prescribed - Push fluids - Rest as needed  Molnupiravir Capsules What is this medication? MOLNUPIRAVIR (MOL nue PIR a vir) treats mild to moderate COVID-19. It may help people who are at high risk of developing severe illness. This medication works by limiting the spread of the virus in your body. The FDA has allowed the emergency use of this medication. This medicine may be used for other purposes; ask your health care provider or pharmacist if you have questions. COMMON BRAND NAME(S): LAGEVRIO What should I tell my care team before I take this medication? They need to know if you have any  of these conditions: Any allergies Any serious illness An unusual or allergic reaction to molnupiravir, other medications, foods, dyes, or preservatives Pregnant or trying to get pregnant Breast-feeding How should I use this medication? Take this medication by mouth with water. Take it as directed on the prescription label at the same time every day. Do not cut, crush, or chew this medication. Swallow the capsules whole. You can take it with or without food. If it upsets your stomach, take it with food. Take all of it unless your care team tells you to stop it early. Keep taking it even if you think you are better. Talk to your care team about the use of this medication in children. Special care may be needed. Overdosage: If you think you have taken too much of this medicine contact a poison control center or emergency room at once. NOTE: This medicine is only for you. Do not share this medicine with others. What if I miss a dose? If you miss a dose, take it as soon as you can unless it is more than 10 hours late. If it is more than 10 hours late, skip the missed dose. Take the next dose at the normal time. Do not take extra or 2 doses at the same time to make up for the missed dose. What may interact with this medication? Interactions have not been studied. This list may not describe all possible interactions. Give your health care provider a list of all the medicines, herbs, non-prescription drugs, or dietary supplements you use. Also tell them if you smoke, drink alcohol, or use illegal drugs. Some items may interact with your medicine. What should I watch for while using this medication? Your condition will be monitored carefully while you are receiving this medication. Visit your care team for regular checkups. Tell your care team if your symptoms do not start to get better or if they get worse. Do not become pregnant while taking this medication. You may need a pregnancy test before starting  this medication. Women must use a reliable form of birth control while taking this medication and for 4 days after stopping the medication. Women should inform their care team if they wish to become pregnant or think they might be pregnant. Men should not father a child while taking this medication and for 3 months after stopping it. There is potential for serious harm to an unborn child. Talk to your care team for more information. Do not breast-feed an infant while taking this medication and for 4 days after stopping the medication. What side effects may I notice from receiving this medication? Side effects that you should report to your care team as soon as possible: Allergic reactions--skin rash, itching, hives, swelling of the face, lips, tongue, or throat Side effects that usually do not require medical attention (report these to your care team if they continue  or are bothersome): Diarrhea Dizziness Nausea This list may not describe all possible side effects. Call your doctor for medical advice about side effects. You may report side effects to FDA at 1-800-FDA-1088. Where should I keep my medication? Keep out of the reach of children and pets. Store at room temperature between 20 and 25 degrees C (68 and 77 degrees F). Get rid of any unused medication after the expiration date. To get rid of medications that are no longer needed or have expired: Take the medication to a medication take-back program. Check with your pharmacy or law enforcement to find a location. If you cannot return the medication, check the label or package insert to see if the medication should be thrown out in the garbage or flushed down the toilet. If you are not sure, ask your care team. If it is safe to put it in the trash, take the medication out of the container. Mix the medication with cat litter, dirt, coffee grounds, or other unwanted substance. Seal the mixture in a bag or container. Put it in the trash. NOTE: This  sheet is a summary. It may not cover all possible information. If you have questions about this medicine, talk to your doctor, pharmacist, or health care provider.  2023 Elsevier/Gold Standard (2020-06-05 00:00:00)   Isolation Instructions: You are to isolate at home for 5 days from onset of your symptoms. If you must be around other household members who do not have symptoms, you need to make sure that both you and the family members are masking consistently with a high-quality mask.  After day 5 of isolation, if you have had no fever within 24 hours and you are feeling better, you can end isolation but need to mask for an additional 5 days.  After day 5 if you have a fever or are having significant symptoms, please isolate for full 10 days.  If you note any worsening of symptoms despite treatment, please seek an in-person evaluation ASAP. If you note any significant shortness of breath or any chest pain, please seek ER evaluation. Please do not delay care!   COVID-19: What to Do if You Are Sick If you test positive and are an older adult or someone who is at high risk of getting very sick from COVID-19, treatment may be available. Contact a healthcare provider right away after a positive test to determine if you are eligible, even if your symptoms are mild right now. You can also visit a Test to Treat location and, if eligible, receive a prescription from a provider. Don't delay: Treatment must be started within the first few days to be effective. If you have a fever, cough, or other symptoms, you might have COVID-19. Most people have mild illness and are able to recover at home. If you are sick: Keep track of your symptoms. If you have an emergency warning sign (including trouble breathing), call 911. Steps to help prevent the spread of COVID-19 if you are sick If you are sick with COVID-19 or think you might have COVID-19, follow the steps below to care for yourself and to help protect other  people in your home and community. Stay home except to get medical care Stay home. Most people with COVID-19 have mild illness and can recover at home without medical care. Do not leave your home, except to get medical care. Do not visit public areas and do not go to places where you are unable to wear a mask. Take care of  yourself. Get rest and stay hydrated. Take over-the-counter medicines, such as acetaminophen, to help you feel better. Stay in touch with your doctor. Call before you get medical care. Be sure to get care if you have trouble breathing, or have any other emergency warning signs, or if you think it is an emergency. Avoid public transportation, ride-sharing, or taxis if possible. Get tested If you have symptoms of COVID-19, get tested. While waiting for test results, stay away from others, including staying apart from those living in your household. Get tested as soon as possible after your symptoms start. Treatments may be available for people with COVID-19 who are at risk for becoming very sick. Don't delay: Treatment must be started early to be effective--some treatments must begin within 5 days of your first symptoms. Contact your healthcare provider right away if your test result is positive to determine if you are eligible. Self-tests are one of several options for testing for the virus that causes COVID-19 and may be more convenient than laboratory-based tests and point-of-care tests. Ask your healthcare provider or your local health department if you need help interpreting your test results. You can visit your state, tribal, local, and territorial health department's website to look for the latest local information on testing sites. Separate yourself from other people As much as possible, stay in a specific room and away from other people and pets in your home. If possible, you should use a separate bathroom. If you need to be around other people or animals in or outside of the  home, wear a well-fitting mask. Tell your close contacts that they may have been exposed to COVID-19. An infected person can spread COVID-19 starting 48 hours (or 2 days) before the person has any symptoms or tests positive. By letting your close contacts know they may have been exposed to COVID-19, you are helping to protect everyone. See COVID-19 and Animals if you have questions about pets. If you are diagnosed with COVID-19, someone from the health department may call you. Answer the call to slow the spread. Monitor your symptoms Symptoms of COVID-19 include fever, cough, or other symptoms. Follow care instructions from your healthcare provider and local health department. Your local health authorities may give instructions on checking your symptoms and reporting information. When to seek emergency medical attention Look for emergency warning signs* for COVID-19. If someone is showing any of these signs, seek emergency medical care immediately: Trouble breathing Persistent pain or pressure in the chest New confusion Inability to wake or stay awake Pale, gray, or blue-colored skin, lips, or nail beds, depending on skin tone *This list is not all possible symptoms. Please call your medical provider for any other symptoms that are severe or concerning to you. Call 911 or call ahead to your local emergency facility: Notify the operator that you are seeking care for someone who has or may have COVID-19. Call ahead before visiting your doctor Call ahead. Many medical visits for routine care are being postponed or done by phone or telemedicine. If you have a medical appointment that cannot be postponed, call your doctor's office, and tell them you have or may have COVID-19. This will help the office protect themselves and other patients. If you are sick, wear a well-fitting mask You should wear a mask if you must be around other people or animals, including pets (even at home). Wear a mask with the  best fit, protection, and comfort for you. You don't need to wear the mask if  you are alone. If you can't put on a mask (because of trouble breathing, for example), cover your coughs and sneezes in some other way. Try to stay at least 6 feet away from other people. This will help protect the people around you. Masks should not be placed on young children under age 24 years, anyone who has trouble breathing, or anyone who is not able to remove the mask without help. Cover your coughs and sneezes Cover your mouth and nose with a tissue when you cough or sneeze. Throw away used tissues in a lined trash can. Immediately wash your hands with soap and water for at least 20 seconds. If soap and water are not available, clean your hands with an alcohol-based hand sanitizer that contains at least 60% alcohol. Clean your hands often Wash your hands often with soap and water for at least 20 seconds. This is especially important after blowing your nose, coughing, or sneezing; going to the bathroom; and before eating or preparing food. Use hand sanitizer if soap and water are not available. Use an alcohol-based hand sanitizer with at least 60% alcohol, covering all surfaces of your hands and rubbing them together until they feel dry. Soap and water are the best option, especially if hands are visibly dirty. Avoid touching your eyes, nose, and mouth with unwashed hands. Handwashing Tips Avoid sharing personal household items Do not share dishes, drinking glasses, cups, eating utensils, towels, or bedding with other people in your home. Wash these items thoroughly after using them with soap and water or put in the dishwasher. Clean surfaces in your home regularly Clean and disinfect high-touch surfaces (for example, doorknobs, tables, handles, light switches, and countertops) in your "sick room" and bathroom. In shared spaces, you should clean and disinfect surfaces and items after each use by the person who is  ill. If you are sick and cannot clean, a caregiver or other person should only clean and disinfect the area around you (such as your bedroom and bathroom) on an as needed basis. Your caregiver/other person should wait as long as possible (at least several hours) and wear a mask before entering, cleaning, and disinfecting shared spaces that you use. Clean and disinfect areas that may have blood, stool, or body fluids on them. Use household cleaners and disinfectants. Clean visible dirty surfaces with household cleaners containing soap or detergent. Then, use a household disinfectant. Use a product from [employer] List N: Disinfectants for Coronavirus (TMAUQ-33). Be sure to follow the instructions on the label to ensure safe and effective use of the product. Many products recommend keeping the surface wet with a disinfectant for a certain period of time (look at "contact time" on the product label). You may also need to wear personal protective equipment, such as gloves, depending on the directions on the product label. Immediately after disinfecting, wash your hands with soap and water for 20 seconds. For completed guidance on cleaning and disinfecting your home, visit Complete Disinfection Guidance. Take steps to improve ventilation at home Improve ventilation (air flow) at home to help prevent from spreading COVID-19 to other people in your household. Clear out COVID-19 virus particles in the air by opening windows, using air filters, and turning on fans in your home. Use this interactive tool to learn how to improve air flow in your home. When you can be around others after being sick with COVID-19 Deciding when you can be around others is different for different situations. Find out when you can safely end  home isolation. For any additional questions about your care, contact your healthcare provider or state or local health department. 08/29/2020 Content source: [facility] ([facility]), Division of Viral Diseases This information is not intended to replace advice given to you by your health care provider. Make sure you discuss any questions you have with your health care provider. Document Revised: 10/12/2020 Document Reviewed: 10/12/2020 Elsevier Patient Education  2022 [employer].       If you have been instructed to have an in-person evaluation today at a local Urgent Care facility, please use the link below. It will take you to a list of all of our available [facility], including address, phone number and hours of operation. Please do not delay care.  [facility]  If you or a family member do not have a primary care provider, use the link below to schedule a visit and establish care. When you choose a [facility] primary care physician or advanced practice provider, you gain a long-term partner in health. Find a Primary Care Provider  Learn more about [facility]'s in-office and virtual care options: [facility]

## 2022-07-17 NOTE — Progress Notes (Signed)
New Patient Office Visit  Subjective    Patient ID: Danielle Irwin, female    DOB: Sep 07, 1970  Age: 52 y.o. MRN: [id number]  CC:  Chief Complaint  Patient presents with   New Patient (Initial Visit)    Insomnia,SOB/Lft shldr pain/back pain/dry mouth/anxiety/depression   HPI Danielle Irwin presents to establish care.  Her previous provider was Dr. Rebecka Apley.She works at a call center.   She has history of hypertension, asthma, depression, fibromyalgia, insomnia and degenerative disc disease.   She has multiple concerns today has not been to PCP In last 2 years. She moved in with her parents two years ago and is the main care giver.   She had COVID on July 08, 2022.  She is doing better but still have some residual cough.   She also complain of SOB that is gradually getting worse from last year and half.  She has h/o of sleep apnea and uses CPAP machine.  Denies fever, headache,sore throat, chest pain and congestion.   Outpatient Encounter Medications as of 07/18/2022  Medication Sig   acetaminophen (TYLENOL) 650 MG CR tablet Take 650 mg by mouth every 8 (eight) hours as needed for pain.   albuterol (VENTOLIN HFA) 108 (90 Base) MCG/ACT inhaler Inhale 2 puffs into the lungs every 6 (six) hours as needed for wheezing or shortness of breath.   Alum & Mag Hydroxide-Simeth (ANTACID ANTI-GAS PO) Take 1 tablet by mouth daily as needed (for gas).   Ascorbic Acid (VITAMIN C) 1000 MG tablet Take 1,000 mg by mouth 2 (two) times daily.   aspirin-acetaminophen-caffeine (EXCEDRIN MIGRAINE) 250-250-65 MG tablet Take 2 tablets by mouth 2 (two) times daily as needed for headache or migraine.   b complex vitamins tablet Take 1 tablet by mouth daily.   benzonatate (TESSALON) 100 MG capsule Take 1 capsule (100 mg total) by mouth 3 (three) times daily as needed for cough.   bismuth subsalicylate (PEPTO-BISMOL) 262 MG/15ML suspension Take 30 mLs by mouth 4 (four) times daily -  before meals and at  bedtime.   brompheniramine-pseudoephedrine-DM 30-2-10 MG/5ML syrup Take 5 mLs by mouth 4 (four) times daily as needed.   Calcium Carbonate-Vitamin D (CALCIUM-D PO) Take 1,200 mg by mouth 2 (two) times daily.   diphenhydrAMINE (BENADRYL) 25 MG tablet Take 25-50 mg by mouth every 6 (six) hours as needed for itching, allergies or sleep.   ECHINACEA-GOLDEN SEAL PO Take 1 tablet by mouth 2 (two) times daily as needed (immune system boost).    Magnesium 500 MG TABS Take 500 mg by mouth at bedtime.   Multiple Vitamin (MULTIVITAMIN WITH MINERALS) TABS tablet Take 1 tablet by mouth daily.   Multiple Vitamins-Minerals (ZINC PO) Take 500 mg by mouth daily as needed (immune system boost).   traZODone (DESYREL) 50 MG tablet Take 0.5-1 tablets (25-50 mg total) by mouth at bedtime as needed for sleep.   [DISCONTINUED] lisinopril (ZESTRIL) 10 MG tablet Take 1 tablet (10 mg total) by mouth daily.   [DISCONTINUED] sertraline (ZOLOFT) 100 MG tablet Take 1.5 tablets (150 mg total) by mouth daily.   EPINEPHrine 0.3 mg/0.3 mL IJ SOAJ injection INJECT 0.3ML INTO THE MUSCLE ONCE AS DIRECTED   furosemide (LASIX) 20 MG tablet Take 1 tablet (20 mg total) by mouth daily. (Patient not taking: Reported on 07/18/2022)   lisinopril (ZESTRIL) 10 MG tablet Take 1 tablet (10 mg total) by mouth daily.   Melatonin 10 MG TABS Take 1 tablet by mouth at bedtime. (Patient  not taking: Reported on 07/18/2022)   ondansetron (ZOFRAN ODT) 4 MG disintegrating tablet Take 1 tablet (4 mg total) by mouth every 8 (eight) hours as needed for nausea or vomiting. (Patient not taking: Reported on 07/18/2022)   [DISCONTINUED] EPINEPHRINE 0.3 mg/0.3 mL IJ SOAJ injection INJECT 0.3ML INTO THE MUSCLE ONCE AS DIRECTED (Patient not taking: Reported on 07/18/2022)   No facility-administered encounter medications on file as of 07/18/2022.    Past Medical History:  Diagnosis Date   Asthma    Depression    Family history of adverse reaction to anesthesia    FATHER  WAS SLOW TO WAKE UP   Headache    OCCASIONAL MIGRAINES   HNP (herniated nucleus pulposus), lumbar    Hx of epistaxis    Insomnia    OA (osteoarthritis)    Obesity    PCOS (polycystic ovarian syndrome)    Sensitive skin    Vitamin D deficiency     Past Surgical History:  Procedure Laterality Date   LUMBAR LAMINECTOMY/DECOMPRESSION MICRODISCECTOMY Right 06/02/2014   Procedure: MICRO LUMBAR DECOMPRESSION L4 - L5 ON THE RIGHT 1 LEVEL;  Surgeon: Johnn Hai, MD;  Location: [facility];  Service: Orthopedics;  Laterality: Right;   ORIF HUMERUS FRACTURE Left 08/01/2017   Procedure: OPEN REDUCTION INTERNAL FIXATION (ORIF) PROXIMAL HUMERUS FRACTURE;  Surgeon: Shona Needles, MD;  Location: [facility];  Service: Orthopedics;  Laterality: Left;   WISDOM TOOTH EXTRACTION      Family History  Problem Relation Age of Onset   Heart attack Father 71   Breast cancer Maternal Aunt 70    Social History   Socioeconomic History   Marital status: Single    Spouse name: Not on file   Number of children: Not on file   Years of education: Not on file   Highest education level: Not on file  Occupational History   Not on file  Tobacco Use   Smoking status: Former    Types: Cigarettes    Quit date: 05/26/1990    Years since quitting: 32.1   Smokeless tobacco: Never  Vaping Use   Vaping Use: Never used  Substance and Sexual Activity   Alcohol use: No    Alcohol/week: 0.0 standard drinks of alcohol   Drug use: No   Sexual activity: Not on file  Other Topics Concern   Not on file  Social History Narrative   Not on file   Social Determinants of Health   Financial Resource Strain: Not on file  Food Insecurity: Not on file  Transportation Needs: Not on file  Physical Activity: Not on file  Stress: Not on file  Social Connections: Not on file  Intimate Partner Violence: Not on file    Review of Systems  Constitutional: Negative.   HENT: Negative.    Eyes: Negative.   Respiratory:   Positive for cough.   Cardiovascular:  Negative for chest pain and leg swelling.  Musculoskeletal:  Positive for myalgias.  Skin: Negative.   Neurological: Negative.   Psychiatric/Behavioral:  Positive for depression. Negative for substance abuse and suicidal ideas.         Objective    BP 120/70   Pulse 94   Temp 98.1 F (36.7 C) (Oral)   Ht 5' 6.5" (1.689 m)   Wt (!) 382 lb (173.3 kg)   LMP 07/11/2016   SpO2 97%   BMI 60.73 kg/m   Physical Exam Constitutional:      Appearance: Normal appearance. She is  obese.  HENT:     Right Ear: Tympanic membrane normal.     Left Ear: Tympanic membrane normal.     Mouth/Throat:     Mouth: Mucous membranes are moist.  Cardiovascular:     Rate and Rhythm: Normal rate and regular rhythm.     Pulses: Normal pulses.     Heart sounds: Normal heart sounds.  Pulmonary:     Effort: Pulmonary effort is normal.     Breath sounds: Normal breath sounds.  Abdominal:     General: Bowel sounds are normal.     Palpations: Abdomen is soft. There is no mass.     Tenderness: There is no abdominal tenderness.  Musculoskeletal:     Left upper arm: Tenderness present.  Skin:    Coloration: Skin is not pale.     Findings: No bruising.  Neurological:     General: No focal deficit present.     Mental Status: She is alert and oriented to person, place, and time. Mental status is at baseline.  Psychiatric:        Mood and Affect: Mood normal.        Behavior: Behavior normal.        Thought Content: Thought content normal.        Judgment: Judgment normal.         Assessment & Plan:   Problem List Items Addressed This Visit       Cardiovascular and Mediastinum   HTN (hypertension) - Primary    Patient BP  Vitals:   07/18/22 0834  BP: 120/70    in the office today. Advised pt to follow a low sodium and heart healthy diet. Refilled lisinopril 10 mg continue to take daily.        Relevant Medications   EPINEPHrine 0.3 mg/0.3 mL IJ  SOAJ injection   lisinopril (ZESTRIL) 10 MG tablet   Other Relevant Orders   Lipid panel (Completed)   CBC with Differential/Platelet (Completed)   Comprehensive metabolic panel (Completed)     Respiratory   Asthma    Refilled albuterol inhaler, Continue every 4-6 hours as needed. No PFTs in chart. We will refer to pulmonology if no improvement noted.       Relevant Medications   albuterol (VENTOLIN HFA) 108 (90 Base) MCG/ACT inhaler     Other   Morbid obesity (HCC)    Body mass index is 60.73 kg/m. Advised patient to avoid trans fat, fatty and fried food. Went over the risk of chronic diseases with increased weight.          Relevant Orders   TSH (Completed)   Lipid panel (Completed)   CBC with Differential/Platelet (Completed)   Comprehensive metabolic panel (Completed)   Anxiety and depression    PHQ score 20. She also complaint of insomnia will start her on trazodone due to dual benefit.       Relevant Medications   traZODone (DESYREL) 50 MG tablet   Other Visit Diagnoses     Screening for diabetes mellitus       Relevant Orders   Hemoglobin A1c (Completed)   Encounter for vitamin deficiency screening       Relevant Orders   VITAMIN D 25 Hydroxy (Vit-D Deficiency, Fractures) (Completed)       Return in about 3 weeks (around 08/08/2022).   Theresia Lo, NP

## 2022-07-18 ENCOUNTER — Encounter: Payer: Self-pay | Admitting: Nurse Practitioner

## 2022-07-18 ENCOUNTER — Ambulatory Visit: Payer: BC Managed Care – PPO | Admitting: Nurse Practitioner

## 2022-07-18 VITALS — BP 120/70 | HR 94 | Temp 98.1°F | Ht 66.5 in | Wt 382.0 lb

## 2022-07-18 DIAGNOSIS — I10 Essential (primary) hypertension: Secondary | ICD-10-CM | POA: Diagnosis not present

## 2022-07-18 DIAGNOSIS — F32A Depression, unspecified: Secondary | ICD-10-CM

## 2022-07-18 DIAGNOSIS — Z1321 Encounter for screening for nutritional disorder: Secondary | ICD-10-CM | POA: Diagnosis not present

## 2022-07-18 DIAGNOSIS — J4540 Moderate persistent asthma, uncomplicated: Secondary | ICD-10-CM | POA: Diagnosis not present

## 2022-07-18 DIAGNOSIS — Z131 Encounter for screening for diabetes mellitus: Secondary | ICD-10-CM

## 2022-07-18 DIAGNOSIS — F419 Anxiety disorder, unspecified: Secondary | ICD-10-CM

## 2022-07-18 LAB — CBC WITH DIFFERENTIAL/PLATELET
Basophils Absolute: 0 10*3/uL (ref 0.0–0.1)
Basophils Relative: 0.4 % (ref 0.0–3.0)
Eosinophils Absolute: 0.2 10*3/uL (ref 0.0–0.7)
Eosinophils Relative: 2.2 % (ref 0.0–5.0)
HCT: 41.8 % (ref 36.0–46.0)
Hemoglobin: 14.3 g/dL (ref 12.0–15.0)
Lymphocytes Relative: 38.3 % (ref 12.0–46.0)
Lymphs Abs: 2.9 10*3/uL (ref 0.7–4.0)
MCHC: 34.1 g/dL (ref 30.0–36.0)
MCV: 94.8 fl (ref 78.0–100.0)
Monocytes Absolute: 0.7 10*3/uL (ref 0.1–1.0)
Monocytes Relative: 9.5 % (ref 3.0–12.0)
Neutro Abs: 3.8 10*3/uL (ref 1.4–7.7)
Neutrophils Relative %: 49.6 % (ref 43.0–77.0)
Platelets: 239 10*3/uL (ref 150.0–400.0)
RBC: 4.41 Mil/uL (ref 3.87–5.11)
RDW: 12.8 % (ref 11.5–15.5)
WBC: 7.6 10*3/uL (ref 4.0–10.5)

## 2022-07-18 LAB — LIPID PANEL
Cholesterol: 224 mg/dL — ABNORMAL HIGH (ref 0–200)
HDL: 46.7 mg/dL (ref >39.00)
LDL Cholesterol: 159 mg/dL — ABNORMAL HIGH (ref 0–99)
NonHDL: 177.33
Total CHOL/HDL Ratio: 5
Triglycerides: 93 mg/dL (ref 0.0–149.0)
VLDL: 18.6 mg/dL (ref 0.0–40.0)

## 2022-07-18 LAB — COMPREHENSIVE METABOLIC PANEL
ALT: 40 U/L — ABNORMAL HIGH (ref 0–35)
AST: 30 U/L (ref 0–37)
Albumin: 4.3 g/dL (ref 3.5–5.2)
Alkaline Phosphatase: 46 U/L (ref 39–117)
BUN: 16 mg/dL (ref 6–23)
CO2: 26 mEq/L (ref 19–32)
Calcium: 9.1 mg/dL (ref 8.4–10.5)
Chloride: 102 mEq/L (ref 96–112)
Creatinine, Ser: 0.77 mg/dL (ref 0.40–1.20)
GFR: 88.98 mL/min (ref >60.00)
Glucose, Bld: 122 mg/dL — ABNORMAL HIGH (ref 70–99)
Potassium: 4.2 mEq/L (ref 3.5–5.1)
Sodium: 141 mEq/L (ref 135–145)
Total Bilirubin: 1.1 mg/dL (ref 0.2–1.2)
Total Protein: 6.9 g/dL (ref 6.0–8.3)

## 2022-07-18 LAB — HEMOGLOBIN A1C: Hgb A1c MFr Bld: 6 % (ref 4.6–6.5)

## 2022-07-18 LAB — TSH: TSH: 4.25 u[IU]/mL (ref 0.35–5.50)

## 2022-07-18 LAB — VITAMIN D 25 HYDROXY (VIT D DEFICIENCY, FRACTURES): VITD: 17.54 ng/mL — ABNORMAL LOW (ref 30.00–100.00)

## 2022-07-18 MED ORDER — LISINOPRIL 10 MG PO TABS
10.0000 mg | ORAL_TABLET | Freq: Every day | ORAL | 0 refills | Status: DC
Start: 2022-07-18 — End: 2022-10-22

## 2022-07-18 MED ORDER — ALBUTEROL SULFATE HFA 108 (90 BASE) MCG/ACT IN AERS
2.0000 | INHALATION_SPRAY | Freq: Four times a day (QID) | RESPIRATORY_TRACT | 2 refills | Status: AC | PRN
Start: 2022-07-18 — End: ?

## 2022-07-18 MED ORDER — EPINEPHRINE 0.3 MG/0.3ML IJ SOAJ: INTRAMUSCULAR | 0 refills | Status: DC

## 2022-07-18 MED ORDER — TRAZODONE HCL 50 MG PO TABS: 25.0000 mg | ORAL_TABLET | Freq: Every evening | ORAL | 3 refills | Status: DC | PRN

## 2022-07-18 NOTE — Patient Instructions (Addendum)
Started on trazodone 50 mg. Refilled lisinopril, albuterol inhaler and epipen. Discussed the options for weight loss. Will do labs today. If have difficulty breathing or chest pain seek emergency care.   Follow up in 3 weeks.

## 2022-07-26 ENCOUNTER — Encounter: Payer: Self-pay | Admitting: Nurse Practitioner

## 2022-07-26 ENCOUNTER — Telehealth: Payer: BC Managed Care – PPO | Admitting: Family Medicine

## 2022-07-26 DIAGNOSIS — J40 Bronchitis, not specified as acute or chronic: Secondary | ICD-10-CM

## 2022-07-26 MED ORDER — PROMETHAZINE-DM 6.25-15 MG/5ML PO SYRP: 5.0000 mL | ORAL_SOLUTION | Freq: Four times a day (QID) | ORAL | 0 refills | Status: DC | PRN

## 2022-07-26 MED ORDER — AZITHROMYCIN 250 MG PO TABS: ORAL_TABLET | ORAL | 0 refills | Status: AC

## 2022-07-26 MED ORDER — PREDNISONE 20 MG PO TABS: 20.0000 mg | ORAL_TABLET | Freq: Two times a day (BID) | ORAL | 0 refills | Status: DC

## 2022-07-26 NOTE — Assessment & Plan Note (Signed)
Patient BP  Vitals:   07/18/22 0834  BP: 120/70    in the office today. Advised pt to follow a low sodium and heart healthy diet. Refilled lisinopril 10 mg continue to take daily.

## 2022-07-26 NOTE — Assessment & Plan Note (Signed)
Body mass index is 60.73 kg/m. Advised patient to avoid trans fat, fatty and fried food. Went over the risk of chronic diseases with increased weight.

## 2022-07-26 NOTE — Assessment & Plan Note (Signed)
Refilled albuterol inhaler, Continue every 4-6 hours as needed. No PFTs in chart. We will refer to pulmonology if no improvement noted.

## 2022-07-26 NOTE — Patient Instructions (Signed)

## 2022-07-26 NOTE — Assessment & Plan Note (Addendum)
PHQ score 20. She also complaint of insomnia will start her on trazodone due to dual benefit.

## 2022-07-26 NOTE — Progress Notes (Signed)
Virtual Visit Consent   Danielle Irwin, you are scheduled for a virtual visit with a [facility] provider today. Just as with appointments in the office, your consent must be obtained to participate. Your consent will be active for this visit and any virtual visit you may have with one of our providers in the next 365 days. If you have a MyChart account, a copy of this consent can be sent to you electronically.  As this is a virtual visit, video technology does not allow for your provider to perform a traditional examination. This may limit your provider's ability to fully assess your condition. If your provider identifies any concerns that need to be evaluated in person or the need to arrange testing (such as labs, EKG, etc.), we will make arrangements to do so. Although advances in technology are sophisticated, we cannot ensure that it will always work on either your end or our end. If the connection with a video visit is poor, the visit may have to be switched to a telephone visit. With either a video or telephone visit, we are not always able to ensure that we have a secure connection.  By engaging in this virtual visit, you consent to the provision of healthcare and authorize for your insurance to be billed (if applicable) for the services provided during this visit. Depending on your insurance coverage, you may receive a charge related to this service.  I need to obtain your verbal consent now. Are you willing to proceed with your visit today? Danielle Irwin has provided verbal consent on 07/26/2022 for a virtual visit (video or telephone). Danielle Nims, FNP  Date: 07/26/2022 7:26 PM  Virtual Visit via Video Note   I, Danielle Irwin, connected with  Danielle Irwin  ([id number][birth year]) on 07/26/22 at  7:30 PM EST by a video-enabled telemedicine application and verified that I am speaking with the correct person using two identifiers.  Location: Patient: Virtual Visit Location Patient:  Home Provider: Virtual Visit Location Provider: Home Office   I discussed the limitations of evaluation and management by telemedicine and the availability of in person appointments. The patient expressed understanding and agreed to proceed.    History of Present Illness: Danielle Irwin is a 52 y.o. who identifies as a female who was assigned female at birth, and is being seen today for covid 3 weeks ago, persistent cough, dry, fever, no chills or myalgias, she took an antiviral. She is not on antibiotics. History of asthma.   HPI: HPI  Problems:  Patient Active Problem List   Diagnosis Date Noted   Low back pain 02/01/2021   Low back strain 02/01/2021   Strain of neck muscle 02/01/2021   Upper respiratory infection 03/25/2019   Sinus infection 10/26/2018   Suspected COVID-19 virus infection 10/08/2018   Influenza-like illness 09/28/2018   Fibromyalgia 05/12/2018   Edema 05/12/2018   BMI 60.0-69.9, adult ([place]) 05/12/2018   DDD (degenerative disc disease), thoracolumbar 01/06/2018   DVT (deep venous thrombosis) ([place]) 08/29/2017   Leukocytosis 08/06/2017   Humerus fracture 08/01/2017   CPAP (continuous positive airway pressure) dependence 08/01/2017   Asthma 08/01/2017   HTN (hypertension) 06/24/2017   Anxiety and depression 0000000   Systolic murmur 0000000   Morbid obesity ([place]) 10/26/2014   Insomnia 06/21/2014   Anxiety 06/21/2014   Spinal stenosis at L4-L5 level 06/02/2014   POLYCYSTIC OVARIAN DISEASE 06/19/2010    Allergies:  Allergies  Allergen Reactions   Benzalkonium Chloride Anaphylaxis and  Hives   Neosporin [Neomycin-Bacitracin Zn-Polymyx] Anaphylaxis and Hives   Polysporin [Bacitracin-Polymyxin B] Hives   Bee Venom Hives and Swelling    Throat swelling   Bupropion Hcl Other (See Comments)    angry   Cephalexin Hives and Swelling   Latex    Medications:  Current Outpatient Medications:    azithromycin (ZITHROMAX) 250 MG tablet, Take 2 tablets on day 1,  then 1 tablet daily on days 2 through 5, Disp: 6 tablet, Rfl: 0   predniSONE (DELTASONE) 20 MG tablet, Take 1 tablet (20 mg total) by mouth 2 (two) times daily with a meal for 5 days., Disp: 10 tablet, Rfl: 0   promethazine-dextromethorphan (PROMETHAZINE-DM) 6.25-15 MG/5ML syrup, Take 5 mLs by mouth 4 (four) times daily as needed for up to 10 days for cough., Disp: 118 mL, Rfl: 0   acetaminophen (TYLENOL) 650 MG CR tablet, Take 650 mg by mouth every 8 (eight) hours as needed for pain., Disp: , Rfl:    albuterol (VENTOLIN HFA) 108 (90 Base) MCG/ACT inhaler, Inhale 2 puffs into the lungs every 6 (six) hours as needed for wheezing or shortness of breath., Disp: 8 g, Rfl: 2   Alum & Mag Hydroxide-Simeth (ANTACID ANTI-GAS PO), Take 1 tablet by mouth daily as needed (for gas)., Disp: , Rfl:    Ascorbic Acid (VITAMIN C) 1000 MG tablet, Take 1,000 mg by mouth 2 (two) times daily., Disp: , Rfl:    aspirin-acetaminophen-caffeine (EXCEDRIN MIGRAINE) 250-250-65 MG tablet, Take 2 tablets by mouth 2 (two) times daily as needed for headache or migraine., Disp: , Rfl:    b complex vitamins tablet, Take 1 tablet by mouth daily., Disp: , Rfl:    benzonatate (TESSALON) 100 MG capsule, Take 1 capsule (100 mg total) by mouth 3 (three) times daily as needed for cough., Disp: 20 capsule, Rfl: 0   bismuth subsalicylate (PEPTO-BISMOL) 262 MG/15ML suspension, Take 30 mLs by mouth 4 (four) times daily -  before meals and at bedtime., Disp: 360 mL, Rfl: 0   brompheniramine-pseudoephedrine-DM 30-2-10 MG/5ML syrup, Take 5 mLs by mouth 4 (four) times daily as needed., Disp: 120 mL, Rfl: 0   Calcium Carbonate-Vitamin D (CALCIUM-D PO), Take 1,200 mg by mouth 2 (two) times daily., Disp: , Rfl:    diphenhydrAMINE (BENADRYL) 25 MG tablet, Take 25-50 mg by mouth every 6 (six) hours as needed for itching, allergies or sleep., Disp: , Rfl:    ECHINACEA-GOLDEN SEAL PO, Take 1 tablet by mouth 2 (two) times daily as needed (immune system  boost). , Disp: , Rfl:    EPINEPHrine 0.3 mg/0.3 mL IJ SOAJ injection, INJECT 0.3ML INTO THE MUSCLE ONCE AS DIRECTED, Disp: 2 each, Rfl: 0   furosemide (LASIX) 20 MG tablet, Take 1 tablet (20 mg total) by mouth daily. (Patient not taking: Reported on 07/18/2022), Disp: 30 tablet, Rfl: 3   lisinopril (ZESTRIL) 10 MG tablet, Take 1 tablet (10 mg total) by mouth daily., Disp: 90 tablet, Rfl: 0   Magnesium 500 MG TABS, Take 500 mg by mouth at bedtime., Disp: , Rfl:    Melatonin 10 MG TABS, Take 1 tablet by mouth at bedtime. (Patient not taking: Reported on 07/18/2022), Disp: , Rfl:    Multiple Vitamin (MULTIVITAMIN WITH MINERALS) TABS tablet, Take 1 tablet by mouth daily., Disp: , Rfl:    Multiple Vitamins-Minerals (ZINC PO), Take 500 mg by mouth daily as needed (immune system boost)., Disp: , Rfl:    ondansetron (ZOFRAN ODT) 4 MG disintegrating tablet,  Take 1 tablet (4 mg total) by mouth every 8 (eight) hours as needed for nausea or vomiting. (Patient not taking: Reported on 07/18/2022), Disp: 20 tablet, Rfl: 0   traZODone (DESYREL) 50 MG tablet, Take 0.5-1 tablets (25-50 mg total) by mouth at bedtime as needed for sleep., Disp: 30 tablet, Rfl: 3  Observations/Objective: Patient is well-developed, well-nourished in no acute distress.  Resting comfortably  at home.  Head is normocephalic, atraumatic.  No labored breathing.  Speech is clear and coherent with logical content.  Patient is alert and oriented at baseline.    Assessment and Plan: 1. Bronchitis  Inrease fluids, humidifier at night, tylenol, urgent care if sx persist o rworsen.   Follow Up Instructions: I discussed the assessment and treatment plan with the patient. The patient was provided an opportunity to ask questions and all were answered. The patient agreed with the plan and demonstrated an understanding of the instructions.  A copy of instructions were sent to the patient via MyChart unless otherwise noted below.     The patient  was advised to call back or seek an in-person evaluation if the symptoms worsen or if the condition fails to improve as anticipated.  Time:  I spent 10 minutes with the patient via telehealth technology discussing the above problems/concerns.    Danielle Nims, FNP

## 2022-07-30 ENCOUNTER — Ambulatory Visit: Payer: BC Managed Care – PPO | Admitting: Family

## 2022-07-30 ENCOUNTER — Encounter: Payer: Self-pay | Admitting: Family

## 2022-07-30 ENCOUNTER — Ambulatory Visit
Admission: RE | Admit: 2022-07-30 | Discharge: 2022-07-30 | Disposition: A | Discharge status: Home / Self Care | Payer: BC Managed Care – PPO | Source: Ambulatory Visit | Attending: Family | Admitting: Family

## 2022-07-30 ENCOUNTER — Other Ambulatory Visit: Payer: Self-pay | Admitting: Family

## 2022-07-30 VITALS — BP 128/88 | HR 87 | Temp 97.8°F | Ht 66.5 in | Wt 376.8 lb

## 2022-07-30 DIAGNOSIS — R0602 Shortness of breath: Secondary | ICD-10-CM | POA: Insufficient documentation

## 2022-07-30 DIAGNOSIS — J40 Bronchitis, not specified as acute or chronic: Secondary | ICD-10-CM

## 2022-07-30 DIAGNOSIS — R079 Chest pain, unspecified: Secondary | ICD-10-CM | POA: Diagnosis not present

## 2022-07-30 DIAGNOSIS — R0600 Dyspnea, unspecified: Secondary | ICD-10-CM | POA: Diagnosis not present

## 2022-07-30 MED ORDER — IOHEXOL 350 MG/ML SOLN
100.0000 mL | Freq: Once | INTRAVENOUS | Status: AC | PRN
  Administered 2022-07-30: 100 mL via INTRAVENOUS

## 2022-07-30 MED ORDER — PROMETHAZINE-DM 6.25-15 MG/5ML PO SYRP: 5.0000 mL | ORAL_SOLUTION | Freq: Two times a day (BID) | ORAL | 0 refills | Status: AC | PRN

## 2022-07-30 MED ORDER — PREDNISONE 10 MG PO TABS: ORAL_TABLET | ORAL | 0 refills | Status: DC

## 2022-07-30 MED ORDER — BUDESONIDE-FORMOTEROL FUMARATE 80-4.5 MCG/ACT IN AERO: 2.0000 | INHALATION_SPRAY | Freq: Two times a day (BID) | RESPIRATORY_TRACT | 3 refills | Status: AC

## 2022-07-30 NOTE — Assessment & Plan Note (Addendum)
Strep , covid and flu negative today. Shortness of breath slightly worse from baseline.  I walked with patient up and down the hall and SaO2 stayed at 96%. She was labored in speech.  She does have a history of DVT.  In the setting of a change in shortness of breath, obesity, I have ordered CT angiogram to evaluate for pneumonia , pulmonary embolus.  I suspect deconditioning playing a role.   patient will continue albuterol.  Provided a refill of promethazine DM.  I prescribed Symbicort.  Will await CT angio prior to further antibiotic therapy.  Consider pulmonology consult due to chronic shortness of breath.

## 2022-07-30 NOTE — Progress Notes (Signed)
Assessment & Plan:  Bronchitis Assessment & Plan: Strep , covid and flu negative today. Shortness of breath slightly worse from baseline.  I walked with patient up and down the hall and SaO2 stayed at 96%. She was labored in speech.  She does have a history of DVT.  In the setting of a change in shortness of breath, obesity, I have ordered CT angiogram to evaluate for pneumonia , pulmonary embolus.  I suspect deconditioning playing a role.   patient will continue albuterol.  Provided a refill of promethazine DM.  I prescribed Symbicort.  Will await CT angio prior to further antibiotic therapy.  Consider pulmonology consult due to chronic shortness of breath.  Orders: -     CT Angio Chest Pulmonary Embolism (PE) W or WO Contrast; Future -     Budesonide-Formoterol Fumarate; Inhale 2 puffs into the lungs 2 (two) times daily.  Dispense: 1 each; Refill: 3 -     Promethazine-DM; Take 5 mLs by mouth 2 (two) times daily as needed for up to 10 days for cough.  Dispense: 70 mL; Refill: 0  SOB (shortness of breath) on exertion -     CT Angio Chest Pulmonary Embolism (PE) W or WO Contrast; Future     Return precautions given.   Risks, benefits, and alternatives of the medications and treatment plan prescribed today were discussed, and patient expressed understanding.   Education regarding symptom management and diagnosis given to patient on AVS either electronically or printed.  No follow-ups on file.  Mable Paris, FNP  Subjective:    Patient ID: Danielle Irwin, female    DOB: 08-25-1970, 52 y.o.   MRN: [id number]  CC: Danielle Irwin is a 52 y.o. female who presents today for an acute visit.    HPI: Complains of persistent cough, sob, sore throat x 3 weeks, waxed and waned.   She gets 'out of breath' quicker. No cp,left arm numbness, leg swelling  Some improvement with molnupiravir. After prednisone , cough was less frequent. She didn't feel zpak was helpful.   She is using delsym and  promethazine DM with some relief. She had been on mucinex, since stopped.   She has seasonal allergies.   Tmax 101.3 4 days ago. Since then 97-3F past couple of days.   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    bronchitis 07/26/2022 with [facility], provided albuterol.   She had COVID 3 weeks ago, 07/09/22.  Treated with antiviral, molnupirvir.   Prednisone 6m BID for 5 days, zpak,  07/26/22   history of asthma, hypertension Remote smoking history.   Allergies: Benzalkonium chloride, Neosporin [neomycin-bacitracin zn-polymyx], Polysporin [bacitracin-polymyxin b], Bee venom, Bupropion hcl, Cephalexin, and Latex Current Outpatient Medications on File Prior to Visit  Medication Sig Dispense Refill   acetaminophen (TYLENOL) 650 MG CR tablet Take 650 mg by mouth every 8 (eight) hours as needed for pain.     albuterol (VENTOLIN HFA) 108 (90 Base) MCG/ACT inhaler Inhale 2 puffs into the lungs every 6 (six) hours as needed for wheezing or shortness of breath. 8 g 2   Alum & Mag Hydroxide-Simeth (ANTACID ANTI-GAS PO) Take 1 tablet by mouth daily as needed (for gas).     Ascorbic Acid (VITAMIN C) 1000 MG tablet Take 1,000 mg by mouth 2 (two) times daily.     aspirin-acetaminophen-caffeine (EXCEDRIN MIGRAINE) 250-250-65 MG tablet Take 2 tablets by mouth 2 (two) times  daily as needed for headache or migraine.     azithromycin (ZITHROMAX) 250 MG tablet Take 2 tablets on day 1, then 1 tablet daily on days 2 through 5 6 tablet 0   b complex vitamins tablet Take 1 tablet by mouth daily.     bismuth subsalicylate (PEPTO-BISMOL) 262 MG/15ML suspension Take 30 mLs by mouth 4 (four) times daily -  before meals and at bedtime. 360 mL 0   Calcium Carbonate-Vitamin D (CALCIUM-D PO) Take 1,200 mg by mouth 2 (two) times daily.     diphenhydrAMINE (BENADRYL) 25 MG tablet Take 25-50 mg by mouth every 6 (six) hours as needed for itching, allergies or sleep.      ECHINACEA-GOLDEN SEAL PO Take 1 tablet by mouth 2 (two) times daily as needed (immune system boost).      EPINEPHrine 0.3 mg/0.3 mL IJ SOAJ injection INJECT 0.3ML INTO THE MUSCLE ONCE AS DIRECTED 2 each 0   furosemide (LASIX) 20 MG tablet Take 1 tablet (20 mg total) by mouth daily. 30 tablet 3   lisinopril (ZESTRIL) 10 MG tablet Take 1 tablet (10 mg total) by mouth daily. 90 tablet 0   Magnesium 500 MG TABS Take 500 mg by mouth at bedtime.     Melatonin 10 MG TABS Take 1 tablet by mouth at bedtime.     Multiple Vitamin (MULTIVITAMIN WITH MINERALS) TABS tablet Take 1 tablet by mouth daily.     Multiple Vitamins-Minerals (ZINC PO) Take 500 mg by mouth daily as needed (immune system boost).     ondansetron (ZOFRAN ODT) 4 MG disintegrating tablet Take 1 tablet (4 mg total) by mouth every 8 (eight) hours as needed for nausea or vomiting. 20 tablet 0   predniSONE (DELTASONE) 20 MG tablet Take 1 tablet (20 mg total) by mouth 2 (two) times daily with a meal for 5 days. 10 tablet 0   traZODone (DESYREL) 50 MG tablet Take 0.5-1 tablets (25-50 mg total) by mouth at bedtime as needed for sleep. 30 tablet 3   No current facility-administered medications on file prior to visit.    Review of Systems  Constitutional:  Positive for fatigue. Negative for chills and fever.  HENT:  Positive for congestion, sinus pressure and sore throat. Negative for sinus pain.   Respiratory:  Positive for cough and shortness of breath. Negative for wheezing.   Cardiovascular:  Negative for chest pain and palpitations.  Gastrointestinal:  Negative for nausea and vomiting.      Objective:    BP 128/88   Pulse 87   Temp 97.8 F (36.6 C) (Oral)   Ht 5' 6.5" (1.689 m)   Wt (!) 376 lb 12.8 oz (170.9 kg)   LMP 07/11/2016   SpO2 97%   BMI 59.91 kg/m   BP Readings from Last 3 Encounters:  07/30/22 128/88  07/18/22 120/70  01/02/21 (!) 163/82   Wt Readings from Last 3 Encounters:  07/30/22 (!) 376 lb 12.8 oz (170.9 kg)   07/18/22 (!) 382 lb (173.3 kg)  01/02/21 (!) 383 lb 11.2 oz (174 kg)    Physical Exam Vitals reviewed.  Constitutional:      Appearance: She is well-developed.  HENT:     Head: Normocephalic and atraumatic.     Right Ear: Hearing, tympanic membrane, ear canal and external ear normal. No decreased hearing noted. No drainage, swelling or tenderness. No middle ear effusion. No foreign body. Tympanic membrane is not erythematous or bulging.     Left Ear: Hearing,  tympanic membrane, ear canal and external ear normal. No decreased hearing noted. No drainage, swelling or tenderness.  No middle ear effusion. No foreign body. Tympanic membrane is not erythematous or bulging.     Nose: Nose normal. No rhinorrhea.     Right Sinus: No maxillary sinus tenderness or frontal sinus tenderness.     Left Sinus: No maxillary sinus tenderness or frontal sinus tenderness.     Mouth/Throat:     Pharynx: Uvula midline. No oropharyngeal exudate or posterior oropharyngeal erythema.     Tonsils: No tonsillar abscesses.  Eyes:     Conjunctiva/sclera: Conjunctivae normal.  Cardiovascular:     Rate and Rhythm: Regular rhythm.     Pulses: Normal pulses.     Heart sounds: Normal heart sounds.  Pulmonary:     Effort: Pulmonary effort is normal.     Breath sounds: Normal breath sounds. No wheezing, rhonchi or rales.  Musculoskeletal:     Right lower leg: No edema.     Left lower leg: No edema.  Lymphadenopathy:     Head:     Right side of head: No submental, submandibular, tonsillar, preauricular, posterior auricular or occipital adenopathy.     Left side of head: No submental, submandibular, tonsillar, preauricular, posterior auricular or occipital adenopathy.     Cervical: No cervical adenopathy.  Skin:    General: Skin is warm and dry.  Neurological:     Mental Status: She is alert.  Psychiatric:        Speech: Speech normal.        Behavior: Behavior normal.        Thought Content: Thought content  normal.

## 2022-07-30 NOTE — Patient Instructions (Signed)
I would like for you to continue using albuterol, specifically use albuterol every 6 hours for first 24 hours to get good medication into the lungs and loosen congestion; after, you may use as needed and eventually stop all together when cough resolves.  I have also sent in a new inhaler called Symbicort which is a low-dose steroid component. We will schedule CT angio stat today.  From there we will decide on further antibiotics.  Acute Bronchitis, Adult  Acute bronchitis is sudden inflammation of the main airways (bronchi) that come off the windpipe (trachea) in the lungs. The swelling causes the airways to get smaller and make more mucus than normal. This can make it hard to breathe and can cause coughing or noisy breathing (wheezing). Acute bronchitis may last several weeks. The cough may last longer. Allergies, asthma, and exposure to smoke may make the condition worse. What are the causes? This condition can be caused by germs and by substances that irritate the lungs, including: Cold and flu viruses. The most common cause of this condition is the virus that causes the common cold. Bacteria. This is less common. Breathing in substances that irritate the lungs, including: Smoke from cigarettes and other forms of tobacco. Dust and pollen. Fumes from household cleaning products, gases, or burned fuel. Indoor or outdoor air pollution. What increases the risk? The following factors may make you more likely to develop this condition: A weak body's defense system, also called the immune system. A condition that affects your lungs and breathing, such as asthma. What are the signs or symptoms? Common symptoms of this condition include: Coughing. This may bring up clear, yellow, or green mucus from your lungs (sputum). Wheezing. Runny or stuffy nose. Having too much mucus in your lungs (chest congestion). Shortness of breath. Aches and pains, including sore throat or chest. How is this  diagnosed? This condition is usually diagnosed based on: Your symptoms and medical history. A physical exam. You may also have other tests, including tests to rule out other conditions, such as pneumonia. These tests include: A test of lung function. Test of a mucus sample to look for the presence of bacteria. Tests to check the oxygen level in your blood. Blood tests. Chest X-ray. How is this treated? Most cases of acute bronchitis clear up over time without treatment. Your health care provider may recommend: Drinking more fluids to help thin your mucus so it is easier to cough up. Taking inhaled medicine (inhaler) to improve air flow in and out of your lungs. Using a vaporizer or a humidifier. These are machines that add water to the air to help you breathe better. Taking a medicine that thins mucus and clears congestion (expectorant). Taking a medicine that prevents or stops coughing (cough suppressant). It is not common to take an antibiotic medicine for this condition. Follow these instructions at home:  Take over-the-counter and prescription medicines only as told by your health care provider. Use an inhaler, vaporizer, or humidifier as told by your health care provider. Take two teaspoons (10 mL) of honey at bedtime to lessen coughing at night. Drink enough fluid to keep your urine pale yellow. Do not use any products that contain nicotine or tobacco. These products include cigarettes, chewing tobacco, and vaping devices, such as e-cigarettes. If you need help quitting, ask your health care provider. Get plenty of rest. Return to your normal activities as told by your health care provider. Ask your health care provider what activities are safe for you.  Keep all follow-up visits. This is important. How is this prevented? To lower your risk of getting this condition again: Wash your hands often with soap and water for at least 20 seconds. If soap and water are not available, use  hand sanitizer. Avoid contact with people who have cold symptoms. Try not to touch your mouth, nose, or eyes with your hands. Avoid breathing in smoke or chemical fumes. Breathing smoke or chemical fumes will make your condition worse. Get the flu shot every year. Contact a health care provider if: Your symptoms do not improve after 2 weeks. You have trouble coughing up the mucus. Your cough keeps you awake at night. You have a fever. Get help right away if you: Cough up blood. Feel pain in your chest. Have severe shortness of breath. Faint or keep feeling like you are going to faint. Have a severe headache. Have a fever or chills that get worse. These symptoms may represent a serious problem that is an emergency. Do not wait to see if the symptoms will go away. Get medical help right away. Call your local emergency services (911 in the [place] Do not drive yourself to the hospital. Summary Acute bronchitis is inflammation of the main airways (bronchi) that come off the windpipe (trachea) in the lungs. The swelling causes the airways to get smaller and make more mucus than normal. Drinking more fluids can help thin your mucus so it is easier to cough up. Take over-the-counter and prescription medicines only as told by your health care provider. Do not use any products that contain nicotine or tobacco. These products include cigarettes, chewing tobacco, and vaping devices, such as e-cigarettes. If you need help quitting, ask your health care provider. Contact a health care provider if your symptoms do not improve after 2 weeks. This information is not intended to replace advice given to you by your health care provider. Make sure you discuss any questions you have with your health care provider. Document Revised: 09/06/2021 Document Reviewed: 09/27/2020 Elsevier Patient Education  [employer].

## 2022-08-05 ENCOUNTER — Encounter: Payer: Self-pay | Admitting: Family

## 2022-08-06 NOTE — Telephone Encounter (Signed)
SENT 

## 2022-08-08 ENCOUNTER — Encounter: Payer: Self-pay | Admitting: Nurse Practitioner

## 2022-08-08 ENCOUNTER — Ambulatory Visit: Payer: BC Managed Care – PPO | Admitting: Nurse Practitioner

## 2022-08-08 VITALS — BP 126/82 | HR 99 | Temp 98.0°F | Ht 66.5 in | Wt 380.4 lb

## 2022-08-08 DIAGNOSIS — E785 Hyperlipidemia, unspecified: Secondary | ICD-10-CM

## 2022-08-08 DIAGNOSIS — R7989 Other specified abnormal findings of blood chemistry: Secondary | ICD-10-CM

## 2022-08-08 DIAGNOSIS — G43109 Migraine with aura, not intractable, without status migrainosus: Secondary | ICD-10-CM

## 2022-08-08 DIAGNOSIS — Z1211 Encounter for screening for malignant neoplasm of colon: Secondary | ICD-10-CM

## 2022-08-08 DIAGNOSIS — E559 Vitamin D deficiency, unspecified: Secondary | ICD-10-CM | POA: Diagnosis not present

## 2022-08-08 MED ORDER — SUMATRIPTAN SUCCINATE 25 MG PO TABS
25.0000 mg | ORAL_TABLET | Freq: Two times a day (BID) | ORAL | 0 refills | Status: DC
Start: 2022-08-08 — End: 2022-08-19

## 2022-08-08 NOTE — Patient Instructions (Signed)
Imitrex sent to pharmacy.

## 2022-08-08 NOTE — Progress Notes (Unsigned)
Established Patient Office Visit  Subjective    Patient ID: Danielle Irwin, female    DOB: 06-21-1970  Age: 52 y.o. MRN: [id number]  CC:  Chief Complaint  Patient presents with   Medical Management of Chronic Issues    Constant dull headache with sharp pain on the left side of the head x 2-3 weeks   HPI Danielle Irwin presents routine follow up.  Headache: Patient complains of headache. She does not have a headache at this time.  She has history of hypertension, migraine,asthma, depression, fibromyalgia, insomnia and degenerative disc disease.   Denies fever, headache,sore throat, chest pain and congestion.  Ortho reffereal.  Outpatient Encounter Medications as of 08/08/2022  Medication Sig   acetaminophen (TYLENOL) 650 MG CR tablet Take 650 mg by mouth every 8 (eight) hours as needed for pain.   albuterol (VENTOLIN HFA) 108 (90 Base) MCG/ACT inhaler Inhale 2 puffs into the lungs every 6 (six) hours as needed for wheezing or shortness of breath.   Alum & Mag Hydroxide-Simeth (ANTACID ANTI-GAS PO) Take 1 tablet by mouth daily as needed (for gas).   Ascorbic Acid (VITAMIN C) 1000 MG tablet Take 1,000 mg by mouth 2 (two) times daily.   aspirin-acetaminophen-caffeine (EXCEDRIN MIGRAINE) 250-250-65 MG tablet Take 2 tablets by mouth 2 (two) times daily as needed for headache or migraine.   b complex vitamins tablet Take 1 tablet by mouth daily.   bismuth subsalicylate (PEPTO-BISMOL) 262 MG/15ML suspension Take 30 mLs by mouth 4 (four) times daily -  before meals and at bedtime.   budesonide-formoterol (SYMBICORT) 80-4.5 MCG/ACT inhaler Inhale 2 puffs into the lungs 2 (two) times daily.   Calcium Carbonate-Vitamin D (CALCIUM-D PO) Take 1,200 mg by mouth 2 (two) times daily.   diphenhydrAMINE (BENADRYL) 25 MG tablet Take 25-50 mg by mouth every 6 (six) hours as needed for itching, allergies or sleep.   ECHINACEA-GOLDEN SEAL PO Take 1 tablet by mouth 2  (two) times daily as needed (immune system boost).    EPINEPHrine 0.3 mg/0.3 mL IJ SOAJ injection INJECT 0.3ML INTO THE MUSCLE ONCE AS DIRECTED   furosemide (LASIX) 20 MG tablet Take 1 tablet (20 mg total) by mouth daily.   lisinopril (ZESTRIL) 10 MG tablet Take 1 tablet (10 mg total) by mouth daily.   Magnesium 500 MG TABS Take 500 mg by mouth at bedtime.   Melatonin 10 MG TABS Take 1 tablet by mouth at bedtime.   Multiple Vitamin (MULTIVITAMIN WITH MINERALS) TABS tablet Take 1 tablet by mouth daily.   Multiple Vitamins-Minerals (ZINC PO) Take 500 mg by mouth daily as needed (immune system boost).   ondansetron (ZOFRAN ODT) 4 MG disintegrating tablet Take 1 tablet (4 mg total) by mouth every 8 (eight) hours as needed for nausea or vomiting.   predniSONE (DELTASONE) 10 MG tablet Take 4 tablets ( total 40 mg) by mouth for 2 days; take 3 tablets ( total 30 mg) by mouth for 2 days; take 2 tablets ( total 20 mg) by mouth for 1 day; take 1 tablet ( total 10 mg) by mouth for 1 day.   promethazine-dextromethorphan (PROMETHAZINE-DM) 6.25-15 MG/5ML syrup Take 5 mLs by mouth 2 (two) times daily as needed for up to 10 days for cough.   traZODone (DESYREL)  50 MG tablet Take 0.5-1 tablets (25-50 mg total) by mouth at bedtime as needed for sleep.   No facility-administered encounter medications on file as of 08/08/2022.    Past Medical History:  Diagnosis Date   Asthma    Depression    Family history of adverse reaction to anesthesia    FATHER WAS SLOW TO WAKE UP   Headache    OCCASIONAL MIGRAINES   HNP (herniated nucleus pulposus), lumbar    Hx of epistaxis    Insomnia    OA (osteoarthritis)    Obesity    PCOS (polycystic ovarian syndrome)    Sensitive skin    Vitamin D deficiency     Past Surgical History:  Procedure Laterality Date   LUMBAR LAMINECTOMY/DECOMPRESSION MICRODISCECTOMY Right 06/02/2014   Procedure: MICRO LUMBAR DECOMPRESSION L4 - L5 ON THE RIGHT 1 LEVEL;  Surgeon: Johnn Hai, MD;  Location: [facility];  Service: Orthopedics;  Laterality: Right;   ORIF HUMERUS FRACTURE Left 08/01/2017   Procedure: OPEN REDUCTION INTERNAL FIXATION (ORIF) PROXIMAL HUMERUS FRACTURE;  Surgeon: Shona Needles, MD;  Location: [facility];  Service: Orthopedics;  Laterality: Left;   WISDOM TOOTH EXTRACTION      Family History  Problem Relation Age of Onset   Heart attack Father 58   Breast cancer Maternal Aunt 73    Social History   Socioeconomic History   Marital status: Single    Spouse name: Not on file   Number of children: Not on file   Years of education: Not on file   Highest education level: Not on file  Occupational History   Not on file  Tobacco Use   Smoking status: Former    Types: Cigarettes    Quit date: 05/26/1990    Years since quitting: 32.2   Smokeless tobacco: Never  Vaping Use   Vaping Use: Never used  Substance and Sexual Activity   Alcohol use: No    Alcohol/week: 0.0 standard drinks of alcohol   Drug use: No   Sexual activity: Not on file  Other Topics Concern   Not on file  Social History Narrative   Not on file   Social Determinants of Health   Financial Resource Strain: Not on file  Food Insecurity: Not on file  Transportation Needs: Not on file  Physical Activity: Not on file  Stress: Not on file  Social Connections: Not on file  Intimate Partner Violence: Not on file    Review of Systems  Constitutional: Negative.   HENT: Negative.    Eyes: Negative.   Respiratory:  Negative for shortness of breath.   Cardiovascular:  Negative for chest pain and leg swelling.  Musculoskeletal:  Positive for myalgias.  Skin: Negative.   Neurological:  Positive for headaches.  Psychiatric/Behavioral:  Positive for depression. Negative for substance abuse and suicidal ideas.         Objective    BP 126/82   Pulse 99   Temp 98 F (36.7 C)   LMP 07/11/2016   SpO2 99%   Physical Exam Constitutional:      Appearance: Normal appearance.  She is obese.  HENT:     Right Ear: Tympanic membrane normal.     Left Ear: Tympanic membrane normal.     Mouth/Throat:     Mouth: Mucous membranes are moist.  Cardiovascular:     Rate and Rhythm: Normal rate and regular rhythm.     Pulses: Normal pulses.     Heart sounds:  Normal heart sounds.  Pulmonary:     Effort: Pulmonary effort is normal.     Breath sounds: Normal breath sounds.  Abdominal:     General: Bowel sounds are normal.     Palpations: Abdomen is soft. There is no mass.     Tenderness: There is no abdominal tenderness.  Musculoskeletal:     Left upper arm: Tenderness present.  Skin:    Coloration: Skin is not pale.     Findings: No bruising.  Neurological:     General: No focal deficit present.     Mental Status: She is alert and oriented to person, place, and time. Mental status is at baseline.  Psychiatric:        Mood and Affect: Mood normal.        Behavior: Behavior normal.        Thought Content: Thought content normal.        Judgment: Judgment normal.         Assessment & Plan:   Problem List Items Addressed This Visit       Other   Vitamin D deficiency - Primary   No follow-ups on file.   Theresia Lo, NP

## 2022-08-11 ENCOUNTER — Encounter: Payer: Self-pay | Admitting: Nurse Practitioner

## 2022-08-12 NOTE — Telephone Encounter (Signed)
PA Imitrex

## 2022-08-13 ENCOUNTER — Other Ambulatory Visit: Payer: Self-pay | Admitting: *Deleted

## 2022-08-13 ENCOUNTER — Telehealth: Payer: Self-pay | Admitting: *Deleted

## 2022-08-13 DIAGNOSIS — Z1211 Encounter for screening for malignant neoplasm of colon: Secondary | ICD-10-CM

## 2022-08-13 MED ORDER — NA SULFATE-K SULFATE-MG SULF 17.5-3.13-1.6 GM/177ML PO SOLN
1.0000 | Freq: Once | ORAL | 0 refills | Status: AC
Start: 2022-08-13 — End: 2022-08-13

## 2022-08-13 NOTE — Telephone Encounter (Signed)
Gastroenterology Pre-Procedure Review  Request Date: 09/09/2022 Requesting Physician: Dr. Vicente Males  PATIENT REVIEW QUESTIONS: The patient responded to the following health history questions as indicated:    1. Are you having any GI issues? no 2. Do you have a personal history of Polyps? no 3. Do you have a family history of Colon Cancer or Polyps? no 4. Diabetes Mellitus? no 5. Joint replacements in the past 12 months?no 6. Major health problems in the past 3 months?no 7. Any artificial heart valves, MVP, or defibrillator?no    MEDICATIONS & ALLERGIES:    Patient reports the following regarding taking any anticoagulation/antiplatelet therapy:   Plavix, Coumadin, Eliquis, Xarelto, Lovenox, Pradaxa, Brilinta, or Effient? no Aspirin? no  Patient confirms/reports the following medications:  Current Outpatient Medications  Medication Sig Dispense Refill   Na Sulfate-K Sulfate-Mg Sulf 17.5-3.13-1.6 GM/177ML SOLN Take 1 kit by mouth once for 1 dose. 354 mL 0   acetaminophen (TYLENOL) 650 MG CR tablet Take 650 mg by mouth every 8 (eight) hours as needed for pain.     albuterol (VENTOLIN HFA) 108 (90 Base) MCG/ACT inhaler Inhale 2 puffs into the lungs every 6 (six) hours as needed for wheezing or shortness of breath. 8 g 2   Alum & Mag Hydroxide-Simeth (ANTACID ANTI-GAS PO) Take 1 tablet by mouth daily as needed (for gas).     Ascorbic Acid (VITAMIN C) 1000 MG tablet Take 1,000 mg by mouth 2 (two) times daily.     aspirin-acetaminophen-caffeine (EXCEDRIN MIGRAINE) 250-250-65 MG tablet Take 2 tablets by mouth 2 (two) times daily as needed for headache or migraine.     b complex vitamins tablet Take 1 tablet by mouth daily.     bismuth subsalicylate (PEPTO-BISMOL) 262 MG/15ML suspension Take 30 mLs by mouth 4 (four) times daily -  before meals and at bedtime. 360 mL 0   budesonide-formoterol (SYMBICORT) 80-4.5 MCG/ACT inhaler Inhale 2 puffs into the lungs 2 (two) times daily. 1 each 3   Calcium  Carbonate-Vitamin D (CALCIUM-D PO) Take 1,200 mg by mouth 2 (two) times daily.     diphenhydrAMINE (BENADRYL) 25 MG tablet Take 25-50 mg by mouth every 6 (six) hours as needed for itching, allergies or sleep.     ECHINACEA-GOLDEN SEAL PO Take 1 tablet by mouth 2 (two) times daily as needed (immune system boost).      EPINEPHrine 0.3 mg/0.3 mL IJ SOAJ injection INJECT 0.3ML INTO THE MUSCLE ONCE AS DIRECTED 2 each 0   furosemide (LASIX) 20 MG tablet Take 1 tablet (20 mg total) by mouth daily. 30 tablet 3   lisinopril (ZESTRIL) 10 MG tablet Take 1 tablet (10 mg total) by mouth daily. 90 tablet 0   Magnesium 500 MG TABS Take 500 mg by mouth at bedtime.     Melatonin 10 MG TABS Take 1 tablet by mouth at bedtime.     Multiple Vitamin (MULTIVITAMIN WITH MINERALS) TABS tablet Take 1 tablet by mouth daily.     Multiple Vitamins-Minerals (ZINC PO) Take 500 mg by mouth daily as needed (immune system boost).     ondansetron (ZOFRAN ODT) 4 MG disintegrating tablet Take 1 tablet (4 mg total) by mouth every 8 (eight) hours as needed for nausea or vomiting. 20 tablet 0   predniSONE (DELTASONE) 10 MG tablet Take 4 tablets ( total 40 mg) by mouth for 2 days; take 3 tablets ( total 30 mg) by mouth for 2 days; take 2 tablets ( total 20 mg) by mouth for 1  day; take 1 tablet ( total 10 mg) by mouth for 1 day. 17 tablet 0   SUMAtriptan (IMITREX) 25 MG tablet Take 1 tablet (25 mg total) by mouth 2 (two) times daily. May repeat in 2 hours if headache persists or recurs. 10 tablet 0   traZODone (DESYREL) 50 MG tablet Take 0.5-1 tablets (25-50 mg total) by mouth at bedtime as needed for sleep. 30 tablet 3   No current facility-administered medications for this visit.    Patient confirms/reports the following allergies:  Allergies  Allergen Reactions   Benzalkonium Chloride Anaphylaxis and Hives   Neosporin [Neomycin-Bacitracin Zn-Polymyx] Anaphylaxis and Hives   Polysporin [Bacitracin-Polymyxin B] Hives   Bee Venom  Hives and Swelling    Throat swelling   Bupropion Hcl Other (See Comments)    angry   Cephalexin Hives and Swelling   Latex     No orders of the defined types were placed in this encounter.   AUTHORIZATION INFORMATION Primary Insurance: 1D#: Group #:  Secondary Insurance: 1D#: Group #:  SCHEDULE INFORMATION: Date: 09/09/2022  Time: Location: [place]

## 2022-08-14 ENCOUNTER — Encounter: Payer: Self-pay | Admitting: Nurse Practitioner

## 2022-08-14 DIAGNOSIS — G43E11 Chronic migraine with aura, intractable, with status migrainosus: Secondary | ICD-10-CM | POA: Insufficient documentation

## 2022-08-14 DIAGNOSIS — G43109 Migraine with aura, not intractable, without status migrainosus: Secondary | ICD-10-CM | POA: Insufficient documentation

## 2022-08-14 DIAGNOSIS — E785 Hyperlipidemia, unspecified: Secondary | ICD-10-CM | POA: Insufficient documentation

## 2022-08-14 DIAGNOSIS — G43909 Migraine, unspecified, not intractable, without status migrainosus: Secondary | ICD-10-CM | POA: Insufficient documentation

## 2022-08-14 DIAGNOSIS — R7989 Other specified abnormal findings of blood chemistry: Secondary | ICD-10-CM | POA: Insufficient documentation

## 2022-08-14 NOTE — Assessment & Plan Note (Signed)
Body mass index is 60.48 kg/m. Referral sent to medical weight management.

## 2022-08-14 NOTE — Assessment & Plan Note (Addendum)
Elevated ALT 07/18/2022. Will repeat labs in 3-4  months.

## 2022-08-14 NOTE — Assessment & Plan Note (Signed)
Vitamin D17.54 On 07/18/2022. Advised patient to take over-the-counter vitamin D supplements.

## 2022-08-14 NOTE — Assessment & Plan Note (Signed)
Advised her to use cold pack and increase hydration. Started on Imitrex.

## 2022-08-14 NOTE — Assessment & Plan Note (Signed)
Total cholesterol 224 and LDL 159 on 07/18/2022. Advised patient to eat healthy avoid fatty and fried food. Will continue to monitor.

## 2022-08-19 ENCOUNTER — Other Ambulatory Visit: Payer: Self-pay

## 2022-08-19 ENCOUNTER — Other Ambulatory Visit (HOSPITAL_COMMUNITY): Payer: Self-pay

## 2022-08-19 MED ORDER — SUMATRIPTAN SUCCINATE 25 MG PO TABS: 25.0000 mg | ORAL_TABLET | ORAL | 0 refills | Status: DC | PRN

## 2022-08-19 NOTE — Telephone Encounter (Signed)
PA status?

## 2022-08-22 ENCOUNTER — Ambulatory Visit (INDEPENDENT_AMBULATORY_CARE_PROVIDER_SITE_OTHER): Payer: BC Managed Care – PPO

## 2022-08-22 ENCOUNTER — Ambulatory Visit
Admission: EM | Admit: 2022-08-22 | Discharge: 2022-08-22 | Disposition: A | Discharge status: Home / Self Care | Payer: BC Managed Care – PPO

## 2022-08-22 ENCOUNTER — Other Ambulatory Visit: Payer: BC Managed Care – PPO

## 2022-08-22 DIAGNOSIS — M25572 Pain in left ankle and joints of left foot: Secondary | ICD-10-CM

## 2022-08-22 DIAGNOSIS — M7989 Other specified soft tissue disorders: Secondary | ICD-10-CM | POA: Diagnosis not present

## 2022-08-22 DIAGNOSIS — M25579 Pain in unspecified ankle and joints of unspecified foot: Secondary | ICD-10-CM | POA: Insufficient documentation

## 2022-08-22 HISTORY — DX: Essential (primary) hypertension: I10

## 2022-08-22 NOTE — ED Provider Notes (Signed)
[facility]    CSN: [id number] Arrival date & time: 08/22/22  1859      History   Chief Complaint Chief Complaint  Patient presents with   Foot Pain    HPI Danielle Irwin is a 52 y.o. female.  Patient presents with left foot pain x 5 days after walking in heels.  Today she was walking with walker and stepped on a curb.  She felt a pop and had sharp pain. She did not fall.  No numbness, weakness, wounds, redness, bruising, or other symptoms.  Treating with ibuprofen.     The history is provided by the patient and medical records.    Past Medical History:  Diagnosis Date   Asthma    Depression    Family history of adverse reaction to anesthesia    FATHER WAS SLOW TO WAKE UP   Headache    OCCASIONAL MIGRAINES   HNP (herniated nucleus pulposus), lumbar    Hx of epistaxis    Hypertension    Insomnia    OA (osteoarthritis)    Obesity    PCOS (polycystic ovarian syndrome)    Sensitive skin    Vitamin D deficiency     Patient Active Problem List   Diagnosis Date Noted   Pain in joint, ankle and foot 08/22/2022   Elevated liver function tests 08/14/2022   Hyperlipidemia 08/14/2022   Migraine with aura and without status migrainosus, not intractable 08/14/2022   Vitamin D deficiency 08/08/2022   Low back pain 02/01/2021   Low back strain 02/01/2021   Strain of neck muscle 02/01/2021   Bronchitis 03/25/2019   Sinus infection 10/26/2018   Suspected COVID-19 virus infection 10/08/2018   Influenza-like illness 09/28/2018   Fibromyalgia 05/12/2018   Edema 05/12/2018   BMI 60.0-69.9, adult ([place]) 05/12/2018   DDD (degenerative disc disease), thoracolumbar 01/06/2018   DVT (deep venous thrombosis) ([place]) 08/29/2017   Leukocytosis 08/06/2017   Humerus fracture 08/01/2017   CPAP (continuous positive airway pressure) dependence 08/01/2017   Asthma 08/01/2017   HTN (hypertension) 06/24/2017   Anxiety and depression 0000000   Systolic murmur 0000000   Morbid  obesity ([place]) 10/26/2014   Insomnia 06/21/2014   Anxiety 06/21/2014   Spinal stenosis at L4-L5 level 06/02/2014   POLYCYSTIC OVARIAN DISEASE 06/19/2010    Past Surgical History:  Procedure Laterality Date   LUMBAR LAMINECTOMY/DECOMPRESSION MICRODISCECTOMY Right 06/02/2014   Procedure: MICRO LUMBAR DECOMPRESSION L4 - L5 ON THE RIGHT 1 LEVEL;  Surgeon: Johnn Hai, MD;  Location: [facility];  Service: Orthopedics;  Laterality: Right;   ORIF HUMERUS FRACTURE Left 08/01/2017   Procedure: OPEN REDUCTION INTERNAL FIXATION (ORIF) PROXIMAL HUMERUS FRACTURE;  Surgeon: Shona Needles, MD;  Location: [facility];  Service: Orthopedics;  Laterality: Left;   WISDOM TOOTH EXTRACTION      OB History   No obstetric history on file.      Home Medications    Prior to Admission medications   Medication Sig Start Date End Date Taking? Authorizing Provider  Na Sulfate-K Sulfate-Mg Sulf 17.5-3.13-1.6 GM/177ML SOLN Take by mouth. 08/13/22  Yes [provider]  acetaminophen (TYLENOL) 650 MG CR tablet Take 650 mg by mouth every 8 (eight) hours as needed for pain.    [provider]  albuterol (VENTOLIN HFA) 108 (90 Base) MCG/ACT inhaler Inhale 2 puffs into the lungs every 6 (six) hours as needed for wheezing or shortness of breath. 07/18/22   Theresia Lo, NP  Alum & Mag Hydroxide-Simeth (  ANTACID ANTI-GAS PO) Take 1 tablet by mouth daily as needed (for gas).    [provider]  Ascorbic Acid (VITAMIN C) 1000 MG tablet Take 1,000 mg by mouth 2 (two) times daily.    [provider]  aspirin-acetaminophen-caffeine (EXCEDRIN MIGRAINE) [id number] MG tablet Take 2 tablets by mouth 2 (two) times daily as needed for headache or migraine.    [provider]  b complex vitamins tablet Take 1 tablet by mouth daily.    [provider]  bismuth subsalicylate (PEPTO-BISMOL) 262 MG/15ML suspension Take 30 mLs by mouth 4 (four) times daily -  before meals and at bedtime.  08/27/18   Nche, Charlene Brooke, NP  budesonide-formoterol (SYMBICORT) 80-4.5 MCG/ACT inhaler Inhale 2 puffs into the lungs 2 (two) times daily. 07/30/22   Burnard Hawthorne, FNP  Calcium Carbonate-Vitamin D (CALCIUM-D PO) Take 1,200 mg by mouth 2 (two) times daily.    [provider]  diphenhydrAMINE (BENADRYL) 25 MG tablet Take 25-50 mg by mouth every 6 (six) hours as needed for itching, allergies or sleep.    [provider]  [facility] SEAL PO Take 1 tablet by mouth 2 (two) times daily as needed (immune system boost).     [provider]  EPINEPHrine 0.3 mg/0.3 mL IJ SOAJ injection INJECT 0.3ML INTO THE MUSCLE ONCE AS DIRECTED 07/18/22   Theresia Lo, NP  furosemide (LASIX) 20 MG tablet Take 1 tablet (20 mg total) by mouth daily. 12/05/20   Cletis Athens, MD  lisinopril (ZESTRIL) 10 MG tablet Take 1 tablet (10 mg total) by mouth daily. 07/18/22   Theresia Lo, NP  Magnesium 500 MG TABS Take 500 mg by mouth at bedtime.    [provider]  Melatonin 10 MG TABS Take 1 tablet by mouth at bedtime.    [provider]  Multiple Vitamin (MULTIVITAMIN WITH MINERALS) TABS tablet Take 1 tablet by mouth daily.    [provider]  Multiple Vitamins-Minerals (ZINC PO) Take 500 mg by mouth daily as needed (immune system boost).    [provider]  ondansetron (ZOFRAN ODT) 4 MG disintegrating tablet Take 1 tablet (4 mg total) by mouth every 8 (eight) hours as needed for nausea or vomiting. 05/30/20   Loura Halt A, NP  predniSONE (DELTASONE) 10 MG tablet Take 4 tablets ( total 40 mg) by mouth for 2 days; take 3 tablets ( total 30 mg) by mouth for 2 days; take 2 tablets ( total 20 mg) by mouth for 1 day; take 1 tablet ( total 10 mg) by mouth for 1 day. Patient not taking: Reported on 08/22/2022 07/30/22   Burnard Hawthorne, FNP  SUMAtriptan (IMITREX) 25 MG tablet Take 1 tablet (25 mg total) by mouth as needed for migraine (for migraine). May  repeat in 2 hours if headache persists or recurs. 08/19/22   Theresia Lo, NP  traZODone (DESYREL) 50 MG tablet Take 0.5-1 tablets (25-50 mg total) by mouth at bedtime as needed for sleep. 07/18/22   Theresia Lo, NP    Family History Family History  Problem Relation Age of Onset   Heart attack Father 49   Breast cancer Maternal Aunt 8    Social History Social History   Tobacco Use   Smoking status: Former    Types: Cigarettes    Quit date: 05/26/1990    Years since quitting: 32.2   Smokeless tobacco: Never  Vaping Use   Vaping Use: Never used  Substance Use Topics  Alcohol use: No    Alcohol/week: 0.0 standard drinks of alcohol   Drug use: No     Allergies   Benzalkonium chloride, Neosporin [neomycin-bacitracin zn-polymyx], Polysporin [bacitracin-polymyxin b], Bee venom, Bupropion hcl, Cephalexin, and Latex   Review of Systems Review of Systems  Constitutional:  Negative for chills and fever.  Musculoskeletal:  Positive for arthralgias and gait problem. Negative for joint swelling.  Skin:  Negative for color change, rash and wound.  Neurological:  Negative for weakness and numbness.  All other systems reviewed and are negative.    Physical Exam Triage Vital Signs ED Triage Vitals  Enc Vitals Group     BP      Pulse      Resp      Temp      Temp src      SpO2      Weight      Height      Head Circumference      Peak Flow      Pain Score      Pain Loc      Pain Edu?      Excl. in [place]?    No data found.  Updated Vital Signs BP 119/74   Pulse 95   Temp 97.8 F (36.6 C)   Resp 18   Ht 5' 6.5" (1.689 m)   Wt (!) 380 lb 6.4 oz (172.5 kg)   LMP 07/11/2016   SpO2 96%   BMI 60.48 kg/m   Visual Acuity Right Eye Distance:   Left Eye Distance:   Bilateral Distance:    Right Eye Near:   Left Eye Near:    Bilateral Near:     Physical Exam Vitals and nursing note reviewed.  Constitutional:      General: She is not in acute distress.     Appearance: She is well-developed. She is obese. She is not ill-appearing.  HENT:     Mouth/Throat:     Mouth: Mucous membranes are moist.  Cardiovascular:     Rate and Rhythm: Normal rate and regular rhythm.  Pulmonary:     Effort: Pulmonary effort is normal. No respiratory distress.  Musculoskeletal:        General: Tenderness present. No swelling or deformity. Normal range of motion.     Cervical back: Neck supple.       Feet:  Skin:    General: Skin is warm and dry.     Capillary Refill: Capillary refill takes less than 2 seconds.     Findings: No bruising, erythema, lesion or rash.  Neurological:     General: No focal deficit present.     Mental Status: She is alert and oriented to person, place, and time.     Sensory: No sensory deficit.     Motor: No weakness.  Psychiatric:        Mood and Affect: Mood normal.        Behavior: Behavior normal.      UC Treatments / Results  Labs (all labs ordered are listed, but only abnormal results are displayed) Labs Reviewed - No data to display  EKG   Radiology DG Foot Complete Left  Result Date: 08/22/2022 CLINICAL DATA:  Pain after injury EXAM: LEFT FOOT - COMPLETE 3+ VIEW COMPARISON:  None Available. FINDINGS: There is no evidence of fracture or dislocation. Plantar calcaneal spur is present. Joint spaces are well maintained. There is soft tissue swelling over the dorsum of the foot. IMPRESSION: Soft  tissue swelling over the dorsum of the foot without evidence of fracture or dislocation. Electronically Signed   By: Ronney Asters M.D.   On: 08/22/2022 19:32    Procedures Procedures (including critical care time)  Medications Ordered in UC Medications - No data to display  Initial Impression / Assessment and Plan / UC Course  I have reviewed the triage vital signs and the nursing notes.  Pertinent labs & imaging results that were available during my care of the patient were reviewed by me and considered in my medical  decision making (see chart for details).    Left foot pain.  Xray negative.  Treating with rest, elevation, ice packs, Tylenol or ibuprofen.  Education provided on foot pain.  Instructed patient to follow-up with orthopedics if her symptoms are not improving.  Contact information for on-call Ortho provided.  Patient agrees to plan of care.   Final Clinical Impressions(s) / UC Diagnoses   Final diagnoses:  Pain of joint of left ankle and foot     Discharge Instructions      Your xray is negative for broken bones.    Take Tylenol or ibuprofen as needed.  Rest and elevate your foot.  Apply ice packs 2-3 times a day for up to 20 minutes each.     Follow up with an orthopedist if your symptoms are not improving.        ED Prescriptions   None    I have reviewed the PDMP during this encounter.   Sharion Balloon, NP 08/22/22 1940

## 2022-08-22 NOTE — ED Triage Notes (Signed)
Patient to Urgent Care with complaints of left sided foot pain x5 days. Reports that she had to wear heels for a work event w/ prolonged standing and her foot began to hurt shortly after. States today she was running errands during her lunch break at work and felt her foot pop after stepping on the curb. Difficulty ambulating due to the pain.  Using walker to help w/ ambulating.

## 2022-08-22 NOTE — Discharge Instructions (Signed)
Your xray is negative for broken bones.    Take Tylenol or ibuprofen as needed.  Rest and elevate your foot.  Apply ice packs 2-3 times a day for up to 20 minutes each.     Follow up with an orthopedist if your symptoms are not improving.

## 2022-08-23 ENCOUNTER — Ambulatory Visit
Admission: RE | Admit: 2022-08-23 | Discharge: 2022-08-23 | Disposition: A | Discharge status: Home / Self Care | Payer: BC Managed Care – PPO | Source: Ambulatory Visit | Attending: Internal Medicine | Admitting: Internal Medicine

## 2022-08-23 DIAGNOSIS — Z1231 Encounter for screening mammogram for malignant neoplasm of breast: Secondary | ICD-10-CM | POA: Insufficient documentation

## 2022-08-26 ENCOUNTER — Telehealth: Payer: Self-pay

## 2022-08-26 ENCOUNTER — Encounter: Payer: Self-pay | Admitting: Internal Medicine

## 2022-08-26 NOTE — Telephone Encounter (Signed)
Called and spoke with pt and went over meds and got more info on reason for visit on tomorrow.

## 2022-08-27 ENCOUNTER — Ambulatory Visit: Payer: BC Managed Care – PPO | Admitting: Nurse Practitioner

## 2022-08-27 ENCOUNTER — Encounter: Payer: Self-pay | Admitting: Nurse Practitioner

## 2022-08-27 VITALS — BP 138/80 | HR 105 | Temp 98.1°F | Ht 66.5 in | Wt 380.6 lb

## 2022-08-27 DIAGNOSIS — M79672 Pain in left foot: Secondary | ICD-10-CM | POA: Diagnosis not present

## 2022-08-27 NOTE — Assessment & Plan Note (Addendum)
X-ray from Urgent Care reviewed. Advised to continue rest, ice, compression and elevation. Continue Tylenol/Ibuprofen as needed. Follow up with Ortho as scheduled.

## 2022-08-27 NOTE — Progress Notes (Signed)
Tomasita Morrow, NP-C Phone: [phone]  Danielle Irwin is a 52 y.o. female who presents today for left foot pain.   Patient reports stepping up onto a curb wrong on 08/22/2022 and hearing a pop. She went to Urgent Care that day where she had x-rays. Her x-ray showed swelling only, no fracture or dislocation. She reports decreased swelling today. She has been keeping it wrapped in an ACE bandage, icing, elevating and taking Tylenol/Ibuprofen. Reports pain 8/10 with walking and 5/10 at rest. Reports most of her pain along the lateral and dorsal side of left foot. Denies numbness and tingling. She has a new patient appointment with Ortho in [place] on Thursday.  Social History   Tobacco Use  Smoking Status Former   Types: Cigarettes   Quit date: 05/26/1990   Years since quitting: 32.2  Smokeless Tobacco Never    Current Outpatient Medications on File Prior to Visit  Medication Sig Dispense Refill   acetaminophen (TYLENOL) 650 MG CR tablet Take 650 mg by mouth every 8 (eight) hours as needed for pain.     albuterol (VENTOLIN HFA) 108 (90 Base) MCG/ACT inhaler Inhale 2 puffs into the lungs every 6 (six) hours as needed for wheezing or shortness of breath. 8 g 2   Alum & Mag Hydroxide-Simeth (ANTACID ANTI-GAS PO) Take 1 tablet by mouth daily as needed (for gas).     Ascorbic Acid (VITAMIN C) 1000 MG tablet Take 1,000 mg by mouth 2 (two) times daily.     aspirin-acetaminophen-caffeine (EXCEDRIN MIGRAINE) 250-250-65 MG tablet Take 2 tablets by mouth 2 (two) times daily as needed for headache or migraine.     b complex vitamins tablet Take 1 tablet by mouth daily.     bismuth subsalicylate (PEPTO-BISMOL) 262 MG/15ML suspension Take 30 mLs by mouth 4 (four) times daily -  before meals and at bedtime. 360 mL 0   budesonide-formoterol (SYMBICORT) 80-4.5 MCG/ACT inhaler Inhale 2 puffs into the lungs 2 (two) times daily. 1 each 3   Calcium Carbonate-Vitamin D (CALCIUM-D PO) Take 1,200 mg by mouth 2  (two) times daily.     diphenhydrAMINE (BENADRYL) 25 MG tablet Take 25-50 mg by mouth every 6 (six) hours as needed for itching, allergies or sleep.     ECHINACEA-GOLDEN SEAL PO Take 1 tablet by mouth 2 (two) times daily as needed (immune system boost).      EPINEPHrine 0.3 mg/0.3 mL IJ SOAJ injection INJECT 0.3ML INTO THE MUSCLE ONCE AS DIRECTED 2 each 0   furosemide (LASIX) 20 MG tablet Take 1 tablet (20 mg total) by mouth daily. 30 tablet 3   lisinopril (ZESTRIL) 10 MG tablet Take 1 tablet (10 mg total) by mouth daily. 90 tablet 0   Magnesium 500 MG TABS Take 500 mg by mouth at bedtime.     Melatonin 10 MG TABS Take 1 tablet by mouth at bedtime.     Multiple Vitamin (MULTIVITAMIN WITH MINERALS) TABS tablet Take 1 tablet by mouth daily.     Multiple Vitamins-Minerals (ZINC PO) Take 500 mg by mouth daily as needed (immune system boost).     Na Sulfate-K Sulfate-Mg Sulf 17.5-3.13-1.6 GM/177ML SOLN Take by mouth.     ondansetron (ZOFRAN ODT) 4 MG disintegrating tablet Take 1 tablet (4 mg total) by mouth every 8 (eight) hours as needed for nausea or vomiting. 20 tablet 0   predniSONE (DELTASONE) 10 MG tablet Take 4 tablets ( total 40 mg) by mouth for 2 days; take 3 tablets (  total 30 mg) by mouth for 2 days; take 2 tablets ( total 20 mg) by mouth for 1 day; take 1 tablet ( total 10 mg) by mouth for 1 day. (Patient not taking: Reported on 08/22/2022) 17 tablet 0   SUMAtriptan (IMITREX) 25 MG tablet Take 1 tablet (25 mg total) by mouth as needed for migraine (for migraine). May repeat in 2 hours if headache persists or recurs. 9 tablet 0   traZODone (DESYREL) 50 MG tablet Take 0.5-1 tablets (25-50 mg total) by mouth at bedtime as needed for sleep. 30 tablet 3   No current facility-administered medications on file prior to visit.     ROS see history of present illness  Objective  Physical Exam Vitals:   08/27/22 0804  BP: 138/80  Pulse: (!) 105  Temp: 98.1 F (36.7 C)  SpO2: 98%    BP  Readings from Last 3 Encounters:  08/27/22 138/80  08/22/22 119/74  08/08/22 126/82   Wt Readings from Last 3 Encounters:  08/27/22 (!) 380 lb 9.6 oz (172.6 kg)  08/22/22 (!) 380 lb 6.4 oz (172.5 kg)  08/08/22 (!) 380 lb 6.4 oz (172.5 kg)    Physical Exam Constitutional:      General: She is not in acute distress.    Appearance: Normal appearance.  HENT:     Head: Normocephalic.  Cardiovascular:     Rate and Rhythm: Normal rate and regular rhythm.     Heart sounds: Normal heart sounds.  Pulmonary:     Effort: Pulmonary effort is normal.     Breath sounds: Normal breath sounds.  Musculoskeletal:     Left foot: Decreased range of motion. Swelling and tenderness present.     Comments: Wrapped in ACE bandage- pain with extension, flexion and adduction. TTP at lateral malleolus and across dorsal surface of foot.   Skin:    General: Skin is warm and dry.  Neurological:     General: No focal deficit present.     Mental Status: She is alert.  Psychiatric:        Mood and Affect: Mood normal.        Behavior: Behavior normal.    Assessment/Plan: Please see individual problem list.  Acute pain of left foot Assessment & Plan: X-ray from Urgent Care reviewed. Advised to continue rest, ice, compression and elevation. Continue Tylenol/Ibuprofen as needed. Follow up with Ortho as scheduled.     Return if symptoms worsen or fail to improve.   Tomasita Morrow, NP-C [place]

## 2022-08-29 ENCOUNTER — Other Ambulatory Visit (INDEPENDENT_AMBULATORY_CARE_PROVIDER_SITE_OTHER): Payer: BC Managed Care – PPO

## 2022-08-29 ENCOUNTER — Ambulatory Visit: Payer: BC Managed Care – PPO | Admitting: Orthopedic Surgery

## 2022-08-29 DIAGNOSIS — M5442 Lumbago with sciatica, left side: Secondary | ICD-10-CM

## 2022-08-29 DIAGNOSIS — G8929 Other chronic pain: Secondary | ICD-10-CM

## 2022-08-29 DIAGNOSIS — M25572 Pain in left ankle and joints of left foot: Secondary | ICD-10-CM | POA: Diagnosis not present

## 2022-08-30 ENCOUNTER — Other Ambulatory Visit: Payer: Self-pay

## 2022-08-30 ENCOUNTER — Encounter: Payer: Self-pay | Admitting: Orthopedic Surgery

## 2022-08-30 NOTE — Progress Notes (Signed)
Office Visit Note   Patient: Danielle Irwin           Date of Birth: 11-20-1970           MRN: [id number] Visit Date: 08/29/2022              Requested by: Theresia Lo, [address] [place],  [place] [address] PCP: Theresia Lo, NP  Chief Complaint  Patient presents with   Lower Back - Pain      HPI: Patient is a 52 year old woman who is seen with a history of lower back radicular symptoms with a history of degenerative disc disease and stenosis.  She has also had cervical symptoms.  She has had epidural steroid injections with Dr. Ernestina Patches December 2019.  She is status post microdiscectomy with Dr. Being.  Patient states he has been having increasing sciatic pain on the left side that radiates to her mid thigh.  Patient states that 2 weeks ago she sustained a left ankle sprain.  Assessment & Plan: Visit Diagnoses:  1. Pain of joint of left ankle and foot   2. Chronic left-sided low back pain with left-sided sciatica     Plan: Will get her a short fracture boot to provide stability for her ankle she will wean out of this as she feels comfortable anticipate that she would need to wear it for about a month.  Will place a consult with Dr. Ernestina Patches for evaluation for epidural steroid injection.  Patient was provided a note to return to work on March 25.  Follow-Up Instructions: No follow-ups on file.   Ortho Exam  Patient is alert, oriented, no adenopathy, well-dressed, normal affect, normal respiratory effort. Examination patient has a negative straight leg raise on the left there is no focal motor weakness.  She is ambulating in flip-flops and an Ace wrap.  She is tender to palpation over the anterior talofibular ligament anterior drawer is stable.  Imaging: No results found. No images are attached to the encounter.  Labs: Lab Results  Component Value Date   HGBA1C 6.0 07/18/2022   HGBA1C 5.7 12/24/2016   HGBA1C 5.3 10/26/2014   ESRSEDRATE 40 (H)  10/26/2014     Lab Results  Component Value Date   ALBUMIN 4.3 07/18/2022   ALBUMIN 4.1 05/12/2018   ALBUMIN 3.5 08/01/2017    No results found for: "MG" Lab Results  Component Value Date   VD25OH 17.54 (L) 07/18/2022   VD25OH 26.79 (L) 05/12/2018   VD25OH 39.10 10/30/2015    No results found for: "PREALBUMIN"    Latest Ref Rng & Units 07/18/2022    9:25 AM 08/06/2017   11:07 AM 08/03/2017    3:42 AM  CBC EXTENDED  WBC 4.0 - 10.5 K/uL 7.6  9.1  11.9   RBC 3.87 - 5.11 Mil/uL 4.41  2.90  2.71   Hemoglobin 12.0 - 15.0 g/dL 14.3  9.3  8.4   HCT 36.0 - 46.0 % 41.8  27.2  25.6   Platelets 150.0 - 400.0 K/uL 239.0  296.0 Result may be falsely decreased due to platelet clumping.  234   NEUT# 1.4 - 7.7 K/uL 3.8  6.0  6.0   Lymph# 0.7 - 4.0 K/uL 2.9  2.2  4.9      There is no height or weight on file to calculate BMI.  Orders:  Orders Placed This Encounter  Procedures   XR Lumbar Spine 2-3 Views   XR Ankle Complete Left  Ambulatory referral to Physical Medicine Rehab   No orders of the defined types were placed in this encounter.    Procedures: No procedures performed  Clinical Data: No additional findings.  ROS:  All other systems negative, except as noted in the HPI. Review of Systems  Objective: Vital Signs: LMP 07/11/2016   Specialty Comments:  No specialty comments available.  PMFS History: Patient Active Problem List   Diagnosis Date Noted   Acute pain of left foot 08/27/2022   Pain in joint, ankle and foot 08/22/2022   Elevated liver function tests 08/14/2022   Hyperlipidemia 08/14/2022   Migraine with aura and without status migrainosus, not intractable 08/14/2022   Vitamin D deficiency 08/08/2022   Low back pain 02/01/2021   Low back strain 02/01/2021   Strain of neck muscle 02/01/2021   Bronchitis 03/25/2019   Sinus infection 10/26/2018   Suspected COVID-19 virus infection 10/08/2018   Influenza-like illness 09/28/2018   Fibromyalgia  05/12/2018   Edema 05/12/2018   BMI 60.0-69.9, adult ([facility]) 05/12/2018   DDD (degenerative disc disease), thoracolumbar 01/06/2018   DVT (deep venous thrombosis) ([facility]) 08/29/2017   Leukocytosis 08/06/2017   Humerus fracture 08/01/2017   CPAP (continuous positive airway pressure) dependence 08/01/2017   Asthma 08/01/2017   HTN (hypertension) 06/24/2017   Anxiety and depression 0000000   Systolic murmur 0000000   Morbid obesity ([facility]) 10/26/2014   Insomnia 06/21/2014   Anxiety 06/21/2014   Spinal stenosis at L4-L5 level 06/02/2014   POLYCYSTIC OVARIAN DISEASE 06/19/2010   Past Medical History:  Diagnosis Date   Asthma    Depression    Family history of adverse reaction to anesthesia    FATHER WAS SLOW TO WAKE UP   Headache    OCCASIONAL MIGRAINES   HNP (herniated nucleus pulposus), lumbar    Hx of epistaxis    Hypertension    Insomnia    OA (osteoarthritis)    Obesity    PCOS (polycystic ovarian syndrome)    Sensitive skin    Vitamin D deficiency     Family History  Problem Relation Age of Onset   Heart attack Father 69   Breast cancer Maternal Aunt 50    Past Surgical History:  Procedure Laterality Date   LUMBAR LAMINECTOMY/DECOMPRESSION MICRODISCECTOMY Right 06/02/2014   Procedure: MICRO LUMBAR DECOMPRESSION L4 - L5 ON THE RIGHT 1 LEVEL;  Surgeon: Johnn Hai, MD;  Location: [facility];  Service: Orthopedics;  Laterality: Right;   ORIF HUMERUS FRACTURE Left 08/01/2017   Procedure: OPEN REDUCTION INTERNAL FIXATION (ORIF) PROXIMAL HUMERUS FRACTURE;  Surgeon: Shona Needles, MD;  Location: [place] [facility];  Service: Orthopedics;  Laterality: Left;   WISDOM TOOTH EXTRACTION     Social History   Occupational History   Not on file  Tobacco Use   Smoking status: Former    Types: Cigarettes    Quit date: 05/26/1990    Years since quitting: 32.2   Smokeless tobacco: Never  Vaping Use   Vaping Use: Never used  Substance and Sexual Activity   Alcohol use: No     Alcohol/week: 0.0 standard drinks of alcohol   Drug use: No   Sexual activity: Not on file

## 2022-09-02 ENCOUNTER — Ambulatory Visit: Payer: BC Managed Care – PPO | Admitting: Nurse Practitioner

## 2022-09-02 ENCOUNTER — Other Ambulatory Visit: Payer: Self-pay | Admitting: Internal Medicine

## 2022-09-02 ENCOUNTER — Encounter: Payer: Self-pay | Admitting: Nurse Practitioner

## 2022-09-02 ENCOUNTER — Other Ambulatory Visit (HOSPITAL_COMMUNITY)
Admission: RE | Admit: 2022-09-02 | Discharge: 2022-09-02 | Disposition: A | Discharge status: Home / Self Care | Payer: BC Managed Care – PPO | Source: Ambulatory Visit | Attending: Nurse Practitioner | Admitting: Nurse Practitioner

## 2022-09-02 VITALS — BP 110/84 | HR 78 | Ht 66.5 in | Wt 379.0 lb

## 2022-09-02 DIAGNOSIS — Z780 Asymptomatic menopausal state: Secondary | ICD-10-CM | POA: Diagnosis not present

## 2022-09-02 DIAGNOSIS — Z01419 Encounter for gynecological examination (general) (routine) without abnormal findings: Secondary | ICD-10-CM | POA: Diagnosis not present

## 2022-09-02 DIAGNOSIS — B372 Candidiasis of skin and nail: Secondary | ICD-10-CM | POA: Diagnosis not present

## 2022-09-02 DIAGNOSIS — Z124 Encounter for screening for malignant neoplasm of cervix: Secondary | ICD-10-CM | POA: Diagnosis not present

## 2022-09-02 DIAGNOSIS — R928 Other abnormal and inconclusive findings on diagnostic imaging of breast: Secondary | ICD-10-CM

## 2022-09-02 DIAGNOSIS — R921 Mammographic calcification found on diagnostic imaging of breast: Secondary | ICD-10-CM

## 2022-09-02 MED ORDER — NYSTATIN 100000 UNIT/GM EX OINT: 1.0000 | TOPICAL_OINTMENT | Freq: Two times a day (BID) | CUTANEOUS | 2 refills | Status: DC

## 2022-09-02 NOTE — Progress Notes (Signed)
   Danielle Irwin [birth year] [id number]   History:  52 y.o. G1P0010 presents as new patient to establish care. Complains of recurrent rash under breasts, abdominal folds and groin. Has some relief with OTC athlete's foot cream. Remote history of abnormal pap years ago. Recent mammogram showed bilateral calcifications, waiting on call back to schedule follow up imaging.   Gynecologic History Patient's last menstrual period was 07/11/2016.   Contraception/Family planning: post menopausal status Sexually active: No  Health Maintenance Last Pap: 2016. Results were: Normal Last mammogram: 08/23/2022. Results were: bilateral calcifications Last colonoscopy: Never. Scheduled 09/09/22 Last Dexa: Not indicated  Past medical history, past surgical history, family history and social history were all reviewed and documented in the EPIC chart. Single. Caring for both parents - mother just diagnosed with dementia, father has parkinson's.   ROS:  A ROS was performed and pertinent positives and negatives are included.  Exam:  Vitals:   09/02/22 0955  BP: 110/84  Pulse: 78  SpO2: 100%  Weight: (!) 379 lb (171.9 kg)  Height: 5' 6.5" (1.689 m)   Body mass index is 60.26 kg/m.  General appearance:  Normal Thyroid:  Symmetrical, normal in size, without palpable masses or nodularity. Respiratory  Auscultation:  Clear without wheezing or rhonchi Cardiovascular  Auscultation:  Regular rate, without rubs, murmurs or gallops  Edema/varicosities:  Not grossly evident Abdominal  Soft,nontender, without masses, guarding or rebound.  Liver/spleen:  No organomegaly noted  Hernia:  None appreciated  Skin  Inspection:  Grossly normal Breasts: Examined lying and sitting.   Right: Without masses, retractions, nipple discharge or axillary adenopathy.   Left: Without masses, retractions, nipple discharge or axillary adenopathy. Genitourinary   Inguinal/mons:  Normal without inguinal adenopathy  External  genitalia:  Normal appearing vulva with no masses, tenderness, or lesions  BUS/Urethra/Skene's glands:  Normal  Vagina:  Normal appearing with normal color and discharge, no lesions  Cervix:  Normal appearing without discharge or lesions  Uterus:  Difficult to palpate due to body habitus but no gross masses or tenderness  Adnexa/parametria:     Rt: Normal in size, without masses or tenderness.   Lt: Normal in size, without masses or tenderness.  Anus and perineum: Normal  Digital rectal exam: Deferred  Patient informed chaperone available to be present for breast and pelvic exam. Patient has requested no chaperone to be present. Patient has been advised what will be completed during breast and pelvic exam.   Assessment/Plan:  52 y.o. G1P0010 to establish care.   Well female exam with routine gynecological exam - Education provided on SBEs, importance of preventative screenings, current guidelines, high calcium diet, regular exercise, and multivitamin daily.  Labs with PCP.  Postmenopausal - no HRT, no bleeding.   Screening for cervical cancer - Plan: Cytology - PAP( [facility]). Remote history of abnormal pap years ago.  Skin yeast infection - Plan: nystatin ointment (MYCOSTATIN) BID as needed. Keep area clean and dry to avoid recurrences.   Screening for breast cancer - Mammogram 08/23/22 showed bilateral calcifications, waiting on call back to schedule follow up imaging. Mammogram prior to that was in 2016.  Screening for colon cancer - Scheduled for colonoscopy 09/09/22.  Screening for osteoporosis - Average risk. Will plan DXA at age 52.   Return in 1 year for annual.     Tamela Gammon DNP, 10:30 AM 09/02/2022

## 2022-09-03 LAB — CYTOLOGY - PAP
Adequacy: ABSENT
Comment: NEGATIVE
Diagnosis: NEGATIVE
High risk HPV: NEGATIVE

## 2022-09-04 ENCOUNTER — Telehealth: Payer: Self-pay | Admitting: Physical Medicine and Rehabilitation

## 2022-09-04 ENCOUNTER — Other Ambulatory Visit: Payer: Self-pay | Admitting: Nurse Practitioner

## 2022-09-04 DIAGNOSIS — B3731 Acute candidiasis of vulva and vagina: Secondary | ICD-10-CM

## 2022-09-04 MED ORDER — FLUCONAZOLE 150 MG PO TABS: 150.0000 mg | ORAL_TABLET | ORAL | 0 refills | Status: DC

## 2022-09-04 NOTE — Telephone Encounter (Signed)
Pt returned call to [place] to set an appt. Please call pt at [phone].

## 2022-09-05 ENCOUNTER — Telehealth: Payer: Self-pay

## 2022-09-05 NOTE — Telephone Encounter (Signed)
Pt is returning call to schedule an office visit

## 2022-09-05 NOTE — Telephone Encounter (Signed)
scheduled

## 2022-09-05 NOTE — Telephone Encounter (Signed)
Patient scheduled.

## 2022-09-06 ENCOUNTER — Ambulatory Visit
Admission: RE | Admit: 2022-09-06 | Discharge: 2022-09-06 | Disposition: A | Discharge status: Home / Self Care | Payer: BC Managed Care – PPO | Source: Ambulatory Visit | Attending: Internal Medicine | Admitting: Internal Medicine

## 2022-09-06 ENCOUNTER — Encounter: Payer: Self-pay | Admitting: Gastroenterology

## 2022-09-06 DIAGNOSIS — R928 Other abnormal and inconclusive findings on diagnostic imaging of breast: Secondary | ICD-10-CM | POA: Insufficient documentation

## 2022-09-06 DIAGNOSIS — R921 Mammographic calcification found on diagnostic imaging of breast: Secondary | ICD-10-CM | POA: Diagnosis not present

## 2022-09-09 ENCOUNTER — Ambulatory Visit: Payer: BC Managed Care – PPO | Admitting: Registered Nurse

## 2022-09-09 ENCOUNTER — Encounter
Admission: RE | Disposition: A | Discharge status: Home / Self Care | Payer: Self-pay | Source: Home / Self Care | Attending: Gastroenterology

## 2022-09-09 ENCOUNTER — Encounter: Payer: Self-pay | Admitting: Gastroenterology

## 2022-09-09 ENCOUNTER — Ambulatory Visit
Admission: RE | Admit: 2022-09-09 | Discharge: 2022-09-09 | Disposition: A | Discharge status: Home / Self Care | Payer: BC Managed Care – PPO | Attending: Gastroenterology | Admitting: Gastroenterology

## 2022-09-09 DIAGNOSIS — I10 Essential (primary) hypertension: Secondary | ICD-10-CM | POA: Insufficient documentation

## 2022-09-09 DIAGNOSIS — F32A Depression, unspecified: Secondary | ICD-10-CM | POA: Insufficient documentation

## 2022-09-09 DIAGNOSIS — E559 Vitamin D deficiency, unspecified: Secondary | ICD-10-CM | POA: Diagnosis not present

## 2022-09-09 DIAGNOSIS — R519 Headache, unspecified: Secondary | ICD-10-CM | POA: Diagnosis not present

## 2022-09-09 DIAGNOSIS — Z1211 Encounter for screening for malignant neoplasm of colon: Secondary | ICD-10-CM

## 2022-09-09 DIAGNOSIS — J45909 Unspecified asthma, uncomplicated: Secondary | ICD-10-CM | POA: Insufficient documentation

## 2022-09-09 DIAGNOSIS — G4730 Sleep apnea, unspecified: Secondary | ICD-10-CM | POA: Insufficient documentation

## 2022-09-09 DIAGNOSIS — F419 Anxiety disorder, unspecified: Secondary | ICD-10-CM | POA: Diagnosis not present

## 2022-09-09 DIAGNOSIS — M797 Fibromyalgia: Secondary | ICD-10-CM | POA: Diagnosis not present

## 2022-09-09 DIAGNOSIS — M1990 Unspecified osteoarthritis, unspecified site: Secondary | ICD-10-CM | POA: Insufficient documentation

## 2022-09-09 DIAGNOSIS — Z6841 Body Mass Index (BMI) 40.0 and over, adult: Secondary | ICD-10-CM | POA: Diagnosis not present

## 2022-09-09 DIAGNOSIS — Z87891 Personal history of nicotine dependence: Secondary | ICD-10-CM | POA: Diagnosis not present

## 2022-09-09 DIAGNOSIS — Z1331 Encounter for screening for depression: Secondary | ICD-10-CM

## 2022-09-09 HISTORY — DX: Sleep apnea, unspecified: G47.30

## 2022-09-09 HISTORY — DX: Personal history of other diseases of the musculoskeletal system and connective tissue: Z87.39

## 2022-09-09 HISTORY — PX: COLONOSCOPY WITH PROPOFOL: SHX5780

## 2022-09-09 SURGERY — COLONOSCOPY WITH PROPOFOL
Anesthesia: General

## 2022-09-09 MED ORDER — PROPOFOL 1000 MG/100ML IV EMUL
INTRAVENOUS | Status: AC
  Filled 2022-09-09: qty 100

## 2022-09-09 MED ORDER — LIDOCAINE HCL (CARDIAC) PF 100 MG/5ML IV SOSY
PREFILLED_SYRINGE | INTRAVENOUS | Status: DC | PRN
  Administered 2022-09-09: 50 mg via INTRAVENOUS

## 2022-09-09 MED ORDER — PROPOFOL 10 MG/ML IV BOLUS
INTRAVENOUS | Status: DC | PRN
  Administered 2022-09-09: 60 mg via INTRAVENOUS
  Administered 2022-09-09: 20 mg via INTRAVENOUS

## 2022-09-09 MED ORDER — PROPOFOL 500 MG/50ML IV EMUL
INTRAVENOUS | Status: DC | PRN
  Administered 2022-09-09: 125 ug/kg/min via INTRAVENOUS

## 2022-09-09 MED ORDER — SODIUM CHLORIDE 0.9 % IV SOLN: INTRAVENOUS | Status: DC

## 2022-09-09 NOTE — Transfer of Care (Signed)
Immediate Anesthesia Transfer of Care Note  Patient: Danielle Irwin  Procedure(s) Performed: COLONOSCOPY WITH PROPOFOL  Patient Location: Endoscopy Unit  Anesthesia Type:General  Level of Consciousness: awake, alert , and oriented  Airway & Oxygen Therapy: Patient Spontanous Breathing  Post-op Assessment: Report given to RN and Post -op Vital signs reviewed and stable  Post vital signs: Reviewed and stable  Last Vitals:  Vitals Value Taken Time  BP 106/55 09/09/22 0824  Temp 36.5 C 09/09/22 0824  Pulse 91 09/09/22 0824  Resp 20 09/09/22 0824  SpO2 97 % 09/09/22 0824    Last Pain:  Vitals:   09/09/22 0824  TempSrc: Tympanic  PainSc: Asleep         Complications: No notable events documented.

## 2022-09-09 NOTE — Anesthesia Preprocedure Evaluation (Addendum)
Anesthesia Evaluation  Patient identified by MRN, date of birth, ID band Patient awake    Reviewed: Allergy & Precautions, H&P , NPO status , Patient's Chart, lab work & pertinent test results, reviewed documented beta blocker date and time   Airway Mallampati: II   Neck ROM: full    Dental  (+) Poor Dentition   Pulmonary asthma , former smoker   Pulmonary exam normal        Cardiovascular Exercise Tolerance: Poor hypertension, On Medications Normal cardiovascular exam+ Valvular Problems/Murmurs  Rhythm:regular Rate:Normal     Neuro/Psych  Headaches PSYCHIATRIC DISORDERS Anxiety Depression     Neuromuscular disease    GI/Hepatic negative GI ROS, Neg liver ROS,,,  Endo/Other    Morbid obesity  Renal/GU negative Renal ROS  negative genitourinary   Musculoskeletal   Abdominal   Peds  Hematology negative hematology ROS (+)   Anesthesia Other Findings Past Medical History: No date: Asthma No date: Depression No date: Family history of adverse reaction to anesthesia     Comment:  FATHER WAS SLOW TO WAKE UP No date: Headache     Comment:  OCCASIONAL MIGRAINES No date: History of fibromyalgia No date: HNP (herniated nucleus pulposus), lumbar No date: Hx of epistaxis No date: Hypertension No date: Insomnia No date: OA (osteoarthritis) No date: Obesity No date: PCOS (polycystic ovarian syndrome) No date: Sensitive skin No date: Sleep apnea No date: Vitamin D deficiency Past Surgical History: 06/02/2014: LUMBAR LAMINECTOMY/DECOMPRESSION MICRODISCECTOMY; Right     Comment:  Procedure: MICRO LUMBAR DECOMPRESSION L4 - L5 ON THE               RIGHT 1 LEVEL;  Surgeon: Johnn Hai, MD;  Location:               [facility];  Service: Orthopedics;  Laterality: Right; 08/01/2017: ORIF HUMERUS FRACTURE; Left     Comment:  Procedure: OPEN REDUCTION INTERNAL FIXATION (ORIF)               PROXIMAL HUMERUS FRACTURE;  Surgeon:  Shona Needles, MD;              Location: [facility];  Service: Orthopedics;  Laterality:               Left; No date: WISDOM TOOTH EXTRACTION BMI    Body Mass Index: 60.57 kg/m     Reproductive/Obstetrics negative OB ROS                             Anesthesia Physical Anesthesia Plan  ASA: 3  Anesthesia Plan: General   Post-op Pain Management:    Induction:   PONV Risk Score and Plan:   Airway Management Planned:   Additional Equipment:   Intra-op Plan:   Post-operative Plan:   Informed Consent: I have reviewed the patients History and Physical, chart, labs and discussed the procedure including the risks, benefits and alternatives for the proposed anesthesia with the patient or authorized representative who has indicated his/her understanding and acceptance.     Dental Advisory Given  Plan Discussed with: CRNA  Anesthesia Plan Comments:        Anesthesia Quick Evaluation

## 2022-09-09 NOTE — Op Note (Signed)
[facility] Patient Name: Danielle Irwin Procedure Date: 09/09/2022 7:05 AM MRN: [id number] Account #: [id number] Date of Birth: [birth year] Admit Type: Outpatient Age: 52 Room: [facility] ENDO ROOM 1 Gender: Female Note Status: Finalized Instrument Name: Jasper Riling [id number] Procedure:             Colonoscopy Indications:           Screening for colorectal malignant neoplasm Providers:             Jonathon Bellows MD, MD Referring MD:          Theresia Lo (Referring MD) Medicines:             Monitored Anesthesia Care Complications:         No immediate complications. Procedure:             Pre-Anesthesia Assessment:                        - Prior to the procedure, a History and Physical was                         performed, and patient medications, allergies and                         sensitivities were reviewed. The patient's tolerance                         of previous anesthesia was reviewed.                        - The risks and benefits of the procedure and the                         sedation options and risks were discussed with the                         patient. All questions were answered and informed                         consent was obtained.                        - ASA Grade Assessment: II - A patient with mild                         systemic disease.                        After obtaining informed consent, the colonoscope was                         passed under direct vision. Throughout the procedure,                         the patient's blood pressure, pulse, and oxygen                         saturations were monitored continuously. The                         Colonoscope was introduced through  the anus and                         advanced to the the cecum, identified by the                         appendiceal orifice. The colonoscopy was performed                         with ease. The patient tolerated the procedure well.                          The quality of the bowel preparation was excellent.                         The ileocecal valve, appendiceal orifice, and rectum                         were photographed. Findings:      The perianal and digital rectal examinations were normal.      The entire examined colon appeared normal on direct and retroflexion       views. Impression:            - The entire examined colon is normal on direct and                         retroflexion views.                        - No specimens collected. Recommendation:        - Discharge patient to home (with escort).                        - Resume previous diet.                        - Continue present medications.                        - Repeat colonoscopy in 10 years for screening                         purposes. Procedure Code(s):     --- Professional ---                        [phone], Colonoscopy, flexible; diagnostic, including                         collection of specimen(s) by brushing or washing, when                         performed (separate procedure) Diagnosis Code(s):     --- Professional ---                        Z12.11, Encounter for screening for malignant neoplasm                         of colon CPT copyright 2022 [employer] Association. All rights reserved. The codes documented in this report are  preliminary and upon coder review may  be revised to meet current compliance requirements. Jonathon Bellows, MD Jonathon Bellows MD, MD 09/09/2022 8:21:31 AM This report has been signed electronically. Number of Addenda: 0 Note Initiated On: 09/09/2022 7:05 AM Scope Withdrawal Time: 0 hours 8 minutes 39 seconds  Total Procedure Duration: 0 hours 10 minutes 26 seconds  Estimated Blood Loss:  Estimated blood loss: none.      [facility]

## 2022-09-09 NOTE — Anesthesia Postprocedure Evaluation (Signed)
Anesthesia Post Note  Patient: Danielle Irwin  Procedure(s) Performed: COLONOSCOPY WITH PROPOFOL  Patient location during evaluation: PACU Anesthesia Type: General Level of consciousness: awake and alert Pain management: pain level controlled Vital Signs Assessment: post-procedure vital signs reviewed and stable Respiratory status: spontaneous breathing, nonlabored ventilation, respiratory function stable and patient connected to nasal cannula oxygen Cardiovascular status: blood pressure returned to baseline and stable Postop Assessment: no apparent nausea or vomiting Anesthetic complications: no   No notable events documented.   Last Vitals:  Vitals:   09/09/22 0834 09/09/22 0844  BP: (!) 122/59 (!) 113/56  Pulse: 88 77  Resp: 20 17  Temp:    SpO2: 98% 100%    Last Pain:  Vitals:   09/09/22 0844  TempSrc:   PainSc: 0-No pain                 Molli Barrows

## 2022-09-09 NOTE — H&P (Signed)
Jonathon Bellows, MD [address] [place], [place], [place], [address] [address], [place], [place], [place], [address] Phone: [phone]  Fax: [phone]  Primary Care Physician:  Theresia Lo, NP   Pre-Procedure History & Physical: HPI:  Jamiyla AMMI PAVLIK is a 52 y.o. female is here for an colonoscopy.   Past Medical History:  Diagnosis Date   Asthma    Depression    Family history of adverse reaction to anesthesia    FATHER WAS SLOW TO WAKE UP   Headache    OCCASIONAL MIGRAINES   History of fibromyalgia    HNP (herniated nucleus pulposus), lumbar    Hx of epistaxis    Hypertension    Insomnia    OA (osteoarthritis)    Obesity    PCOS (polycystic ovarian syndrome)    Sensitive skin    Sleep apnea    Vitamin D deficiency     Past Surgical History:  Procedure Laterality Date   LUMBAR LAMINECTOMY/DECOMPRESSION MICRODISCECTOMY Right 06/02/2014   Procedure: MICRO LUMBAR DECOMPRESSION L4 - L5 ON THE RIGHT 1 LEVEL;  Surgeon: Johnn Hai, MD;  Location: [facility];  Service: Orthopedics;  Laterality: Right;   ORIF HUMERUS FRACTURE Left 08/01/2017   Procedure: OPEN REDUCTION INTERNAL FIXATION (ORIF) PROXIMAL HUMERUS FRACTURE;  Surgeon: Shona Needles, MD;  Location: [facility];  Service: Orthopedics;  Laterality: Left;   WISDOM TOOTH EXTRACTION      Prior to Admission medications   Medication Sig Start Date End Date Taking? Authorizing Provider  aspirin EC 81 MG tablet Take 81 mg by mouth daily. Swallow whole.   Yes [provider]  hydrochlorothiazide (HYDRODIURIL) 25 MG tablet Take 25 mg by mouth daily.   Yes [provider]  methocarbamol (ROBAXIN) 500 MG tablet Take 500 mg by mouth.   Yes [provider]  sertraline (ZOLOFT) 25 MG tablet Take 25 mg by mouth daily.   Yes [provider]  traMADol (ULTRAM) 50 MG tablet Take 50 mg by mouth.   Yes [provider]  acetaminophen (TYLENOL) 650 MG CR tablet Take 650 mg by  mouth every 8 (eight) hours as needed for pain.    [provider]  albuterol (VENTOLIN HFA) 108 (90 Base) MCG/ACT inhaler Inhale 2 puffs into the lungs every 6 (six) hours as needed for wheezing or shortness of breath. 07/18/22   Theresia Lo, NP  Alum & Mag Hydroxide-Simeth (ANTACID ANTI-GAS PO) Take 1 tablet by mouth daily as needed (for gas).    [provider]  Ascorbic Acid (VITAMIN C) 1000 MG tablet Take 1,000 mg by mouth 2 (two) times daily.    [provider]  aspirin-acetaminophen-caffeine (EXCEDRIN MIGRAINE) [phone] MG tablet Take 2 tablets by mouth 2 (two) times daily as needed for headache or migraine.    [provider]  b complex vitamins tablet Take 1 tablet by mouth daily.    [provider]  bismuth subsalicylate (PEPTO-BISMOL) 262 MG/15ML suspension Take 30 mLs by mouth 4 (four) times daily -  before meals and at bedtime. 08/27/18   Nche, Charlene Brooke, NP  budesonide-formoterol (SYMBICORT) 80-4.5 MCG/ACT inhaler Inhale 2 puffs into the lungs 2 (two) times daily. 07/30/22   Burnard Hawthorne, FNP  Calcium Carbonate-Vitamin D (CALCIUM-D PO) Take 1,200 mg by mouth 2 (two) times daily.    [provider]  diphenhydrAMINE (BENADRYL) 25 MG tablet Take 25-50 mg by mouth every 6 (six) hours as needed for itching,  allergies or sleep.    [provider]  [facility] SEAL PO Take 1 tablet by mouth 2 (two) times daily as needed (immune system boost).     [provider]  EPINEPHrine 0.3 mg/0.3 mL IJ SOAJ injection INJECT 0.3ML INTO THE MUSCLE ONCE AS DIRECTED 07/18/22   Theresia Lo, NP  fluconazole (DIFLUCAN) 150 MG tablet Take 1 tablet (150 mg total) by mouth every 3 (three) days. 09/04/22   Tamela Gammon, NP  furosemide (LASIX) 20 MG tablet Take 1 tablet (20 mg total) by mouth daily. 12/05/20   Cletis Athens, MD  lisinopril (ZESTRIL) 10 MG tablet Take 1 tablet (10 mg total) by mouth daily. 07/18/22   Theresia Lo, NP  Magnesium 500 MG TABS Take 500 mg by mouth at bedtime.    [provider]  Melatonin 10 MG TABS Take 1 tablet by mouth at bedtime.    [provider]  Multiple Vitamin (MULTIVITAMIN WITH MINERALS) TABS tablet Take 1 tablet by mouth daily.    [provider]  Multiple Vitamins-Minerals (ZINC PO) Take 500 mg by mouth daily as needed (immune system boost).    [provider]  nystatin ointment (MYCOSTATIN) Apply 1 Application topically 2 (two) times daily. 09/02/22   Tamela Gammon, NP  ondansetron (ZOFRAN ODT) 4 MG disintegrating tablet Take 1 tablet (4 mg total) by mouth every 8 (eight) hours as needed for nausea or vomiting. 05/30/20   Loura Halt A, NP  SUMAtriptan (IMITREX) 25 MG tablet Take 1 tablet (25 mg total) by mouth as needed for migraine (for migraine). May repeat in 2 hours if headache persists or recurs. 08/19/22   Theresia Lo, NP  traZODone (DESYREL) 50 MG tablet Take 0.5-1 tablets (25-50 mg total) by mouth at bedtime as needed for sleep. 07/18/22   Theresia Lo, NP    Allergies as of 08/13/2022 - Review Complete 08/08/2022  Allergen Reaction Noted   Benzalkonium chloride Anaphylaxis and Hives 12/28/2014   Neosporin [neomycin-bacitracin zn-polymyx] Anaphylaxis and Hives 12/28/2014   Polysporin [bacitracin-polymyxin b] Hives 01/06/2018   Bee venom Hives and Swelling 16-Nov-1970   Bupropion hcl Other (See Comments) 06/28/1998   Cephalexin Hives and Swelling 06/21/1998   Latex  06/02/2014    Family History  Problem Relation Age of Onset   Heart attack Father 41   Breast cancer Maternal Aunt 56   Ovarian cancer Neg Hx     Social History   Socioeconomic History   Marital status: Single    Spouse name: Not on file   Number of children: Not on file   Years of education: Not on file   Highest education level: Not on file  Occupational History   Not on file  Tobacco Use   Smoking status: Former    Types:  Cigarettes    Quit date: 05/26/1990    Years since quitting: 32.3   Smokeless tobacco: Never  Vaping Use   Vaping Use: Never used  Substance and Sexual Activity   Alcohol use: Yes    Comment: occ   Drug use: No   Sexual activity: Not Currently    Partners: Male    Birth control/protection: Post-menopausal  Other Topics Concern   Not on file  Social History Narrative   Not on file   Social Determinants of Health   Financial Resource Strain: Not on file  Food Insecurity: Not on file  Transportation Needs: Not on file  Physical Activity: Not on file  Stress: Not on  file  Social Connections: Not on file  Intimate Partner Violence: Not on file    Review of Systems: See HPI, otherwise negative ROS  Physical Exam: BP (!) 155/83   Pulse 93   Temp 97.8 F (36.6 C) (Temporal)   Resp 18   Ht 5' 6.5" (1.689 m)   Wt (!) 172.8 kg   LMP 07/11/2016   SpO2 97%   BMI 60.57 kg/m  General:   Alert,  pleasant and cooperative in NAD Head:  Normocephalic and atraumatic. Neck:  Supple; no masses or thyromegaly. Lungs:  Clear throughout to auscultation, normal respiratory effort.    Heart:  +S1, +S2, Regular rate and rhythm, No edema. Abdomen:  Soft, nontender and nondistended. Normal bowel sounds, without guarding, and without rebound.   Neurologic:  Alert and  oriented x4;  grossly normal neurologically.  Impression/Plan: TALLULAH BIRDWELL is here for an colonoscopy to be performed for Screening colonoscopy average risk   Risks, benefits, limitations, and alternatives regarding  colonoscopy have been reviewed with the patient.  Questions have been answered.  All parties agreeable.   Jonathon Bellows, MD  09/09/2022, 7:58 AM

## 2022-09-10 ENCOUNTER — Encounter: Payer: Self-pay | Admitting: Gastroenterology

## 2022-09-11 ENCOUNTER — Encounter: Payer: Self-pay | Admitting: Physical Medicine and Rehabilitation

## 2022-09-11 ENCOUNTER — Ambulatory Visit: Payer: BC Managed Care – PPO | Admitting: Physical Medicine and Rehabilitation

## 2022-09-11 ENCOUNTER — Other Ambulatory Visit: Payer: Self-pay | Admitting: Internal Medicine

## 2022-09-11 DIAGNOSIS — M5442 Lumbago with sciatica, left side: Secondary | ICD-10-CM

## 2022-09-11 DIAGNOSIS — M961 Postlaminectomy syndrome, not elsewhere classified: Secondary | ICD-10-CM | POA: Diagnosis not present

## 2022-09-11 DIAGNOSIS — M47816 Spondylosis without myelopathy or radiculopathy, lumbar region: Secondary | ICD-10-CM

## 2022-09-11 DIAGNOSIS — M5416 Radiculopathy, lumbar region: Secondary | ICD-10-CM | POA: Diagnosis not present

## 2022-09-11 DIAGNOSIS — R921 Mammographic calcification found on diagnostic imaging of breast: Secondary | ICD-10-CM

## 2022-09-11 DIAGNOSIS — R928 Other abnormal and inconclusive findings on diagnostic imaging of breast: Secondary | ICD-10-CM

## 2022-09-11 DIAGNOSIS — G8929 Other chronic pain: Secondary | ICD-10-CM

## 2022-09-11 MED ORDER — DIAZEPAM 5 MG PO TABS: ORAL_TABLET | ORAL | 0 refills | Status: DC

## 2022-09-11 NOTE — Progress Notes (Signed)
Danielle Irwin - 52 y.o. female MRN [id number]  Date of birth: [birth year]  Office Visit Note: Visit Date: 09/11/2022 PCP: Theresia Lo, NP Referred by: Theresia Lo, NP  Subjective: Chief Complaint  Patient presents with   Lower Back - Pain   HPI: Danielle Irwin is a 52 y.o. female who comes in today for evaluation of chronic, worsening and severe of left lower back pain radiating down posterolateral leg to foot. Patient was previously seen in our office in 2019. She was unable to return to our office due to insurance issues. Period of several years where she was unable to seek care for her chronic back issues. Pain ongoing for several years. Pain worsens with prolonged sitting and standing, riding in car causes severe pain. She describes pain as sore, aching and shooting sensation, currently rates as 8 out of 10. Some relief of pain with home exercise regimen, rest and use of medications. History of formal physical therapy with some relief of pain. Lumbar MRI imaging from 2019 exhibits previous right L4-5 hemilaminectomy with small disc protrusion and annular fissure, 5 mm left central disc protrusion displacing left S1 nerve at L5-S1. No high grade spinal canal stenosis noted. History of multiple left S1 transformainal epidural steroid injections in our office over the years, she reports significant and sustained relief of pain with these procedures. Patient works in [employer] therapist, does sit for several hours a day, feels this exacerbates her pain. Patient was recently seen by Dr. Meridee Score in our practice for left ankle pain. She is wearing short fracture boot and using cane to ambulate. She does carry diagnosis of fibromyalgia and morbid obesity. Patient denies focal weakness, numbness and tingling. No recent trauma or falls.    Review of Systems  Musculoskeletal:  Positive for back pain.  Neurological:  Negative for tingling, sensory change, focal weakness and weakness.   All other systems reviewed and are negative.  Otherwise per HPI.  Assessment & Plan: Visit Diagnoses:    ICD-10-CM   1. Lumbar radiculopathy  M54.16 MR LUMBAR SPINE WO CONTRAST    2. Chronic left-sided low back pain with left-sided sciatica  M54.42    G89.29     3. Facet arthropathy, lumbar  M47.816     4. Post laminectomy syndrome  M96.1     5. Morbid (severe) obesity due to excess calories  E66.01        Plan: Findings:  Chronic, worsening and severe left lower back pain radiating down posterolateral leg to foot. Patient continues to have severe pain despite good conservative therapies such as home exercise regimen, rest and medications. Patients clinical presentation and exam are consistent with S1 nerve pattern. MRI imaging from 2019 does exhibit left central disc protrusion at L5-S1 displacing the left S1 nerve root. Next step is to obtain new lumbar MRI imaging. Depending on MRI results we did discuss possibly of performing lumbar epidural steroid injection. Patient did voice anxiety related to MRI imaging, I did prescribe pre-procedure Valium for her to take on day of procedure. Could always look at re-grouping with physical therapy as her lifestyle is very sedentary. I also feel she would benefit from weight loss program. We will have her follow up after lumbar MRI is obtained to review and discuss treatment options. No red flag symptoms noted upon exam today.     Meds & Orders:  Meds ordered this encounter  Medications   diazepam (VALIUM) 5 MG tablet    Sig:  Take one tablet by mouth with food one hour prior to procedure. May repeat 30 minutes prior if needed.    Dispense:  2 tablet    Refill:  0    Orders Placed This Encounter  Procedures   MR LUMBAR SPINE WO CONTRAST    Follow-up: Return for follow up for lumbar MRI review.   Procedures: No procedures performed      Clinical History: Narrative & Impression CLINICAL DATA:  Severe low back pain radiating to LEFT  lower extremity with numbness for 6 weeks. History of RIGHT L4-5 micro discectomy 2015.   EXAM: MRI LUMBAR SPINE WITHOUT AND WITH CONTRAST   TECHNIQUE: Multiplanar and multiecho pulse sequences of the lumbar spine were obtained without and with intravenous contrast.   CONTRAST:  86mL MULTIHANCE GADOBENATE DIMEGLUMINE 529 MG/ML IV SOLN   COMPARISON:  Lumbar spine radiographs January 12, 2018   FINDINGS: SEGMENTATION: For the purposes of this report, the last well-formed intervertebral disc is reported as L5-S1.   ALIGNMENT: Maintained lumbar lordosis. No malalignment.   VERTEBRAE: Vertebral bodies are intact. Mild L3-4, moderate L4-5 and L5-S1 disc height loss with mild disc desiccation L3-4 through L5-S1. Mild chronic discogenic endplate changes lower lumbar spine without acute or abnormal bone marrow signal. No abnormal osseous or disc enhancement. No abnormal bone marrow signal. No abnormal osseous or intradiscal enhancement.   CONUS MEDULLARIS AND CAUDA EQUINA: Conus medullaris terminates at T12-L1 and demonstrates normal morphology and signal characteristics. Cauda equina is normal. No abnormal cord, leptomeningeal or epidural enhancement.   PARASPINAL AND OTHER SOFT TISSUES: Nonacute. RIGHT paraspinal muscle scarring L4-5.   DISC LEVELS:   T12-L1 through L2-3: No disc bulge, canal stenosis nor neural foraminal narrowing.   L3-4: Annular bulging. Mild facet arthropathy and ligamentum flavum redundancy without canal stenosis or neural foraminal narrowing.   L4-5: RIGHT hemilaminectomy. 3 mm broad-based disc bulge with enhancing annular fissure, possibly postoperative. No canal stenosis. Mild RIGHT greater than LEFT neural foraminal narrowing.   L5-S1: 5 mm LEFT central disc protrusion posteriorly displacing the traversing LEFT S1 nerve which is enlarged, edematous and enhancing. Mild facet arthropathy without canal stenosis. Mild LEFT neural foraminal narrowing.    IMPRESSION: 1. 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 2. Status post RIGHT L4-5 hemilaminectomy with small disc protrusion and annular fissure. 3. No canal stenosis. Mild L4-5 and L5-S1 neural foraminal narrowing.     Electronically Signed   By: Elon Alas M.D.   On: 02/21/2018 23:45   She reports that she quit smoking about 32 years ago. Her smoking use included cigarettes. She has never used smokeless tobacco.  Recent Labs    07/18/22 0925  HGBA1C 6.0    Objective:  VS:  HT:    WT:   BMI:     BP:   HR: bpm  TEMP: ( )  RESP:  Physical Exam Vitals and nursing note reviewed.  HENT:     Head: Normocephalic and atraumatic.     Right Ear: External ear normal.     Left Ear: External ear normal.     Nose: Nose normal.     Mouth/Throat:     Mouth: Mucous membranes are moist.  Eyes:     Extraocular Movements: Extraocular movements intact.  Cardiovascular:     Rate and Rhythm: Normal rate.     Pulses: Normal pulses.  Pulmonary:     Effort: Pulmonary effort is normal.  Abdominal:     General:  Abdomen is flat. There is no distension.  Musculoskeletal:        General: Tenderness present.     Cervical back: Normal range of motion.     Comments: Patient rises from seated position to standing without difficulty. Good lumbar range of motion. No pain noted with facet loading. 5/5 strength noted with bilateral hip flexion, knee flexion/extension, ankle dorsiflexion/plantarflexion and EHL. No clonus noted bilaterally. No pain upon palpation of greater trochanters. No pain with internal/external rotation of bilateral hips. Sensation intact bilaterally. Dysesthesias noted to left S1 dermatome. Positive slump test on the left. Ambulates with cane.  Skin:    General: Skin is warm and dry.     Capillary Refill: Capillary refill takes less than 2 seconds.  Neurological:     Mental Status: She is alert.     Gait: Gait abnormal.  Psychiatric:         Mood and Affect: Mood normal.        Behavior: Behavior normal.     Ortho Exam  Imaging: No results found.  Past Medical/Family/Surgical/Social History: Medications & Allergies reviewed per EMR, new medications updated. Patient Active Problem List   Diagnosis Date Noted   Acute pain of left foot 08/27/2022   Pain in joint, ankle and foot 08/22/2022   Elevated liver function tests 08/14/2022   Hyperlipidemia 08/14/2022   Migraine with aura and without status migrainosus, not intractable 08/14/2022   Vitamin D deficiency 08/08/2022   Low back pain 02/01/2021   Low back strain 02/01/2021   Strain of neck muscle 02/01/2021   Bronchitis 03/25/2019   Sinus infection 10/26/2018   Suspected COVID-19 virus infection 10/08/2018   Influenza-like illness 09/28/2018   Fibromyalgia 05/12/2018   Edema 05/12/2018   BMI 60.0-69.9, adult 05/12/2018   DDD (degenerative disc disease), thoracolumbar 01/06/2018   DVT (deep venous thrombosis) 08/29/2017   Leukocytosis 08/06/2017   Humerus fracture 08/01/2017   CPAP (continuous positive airway pressure) dependence 08/01/2017   Asthma 08/01/2017   HTN (hypertension) 06/24/2017   Anxiety and depression 0000000   Systolic murmur 0000000   Colon cancer screening 10/26/2014   Morbid obesity 10/26/2014   Insomnia 06/21/2014   Anxiety 06/21/2014   Spinal stenosis at L4-L5 level 06/02/2014   POLYCYSTIC OVARIAN DISEASE 06/19/2010   Past Medical History:  Diagnosis Date   Asthma    Depression    Family history of adverse reaction to anesthesia    FATHER WAS SLOW TO WAKE UP   Headache    OCCASIONAL MIGRAINES   History of fibromyalgia    HNP (herniated nucleus pulposus), lumbar    Hx of epistaxis    Hypertension    Insomnia    OA (osteoarthritis)    Obesity    PCOS (polycystic ovarian syndrome)    Sensitive skin    Sleep apnea    Vitamin D deficiency    Family History  Problem Relation Age of Onset   Heart attack Father 75    Breast cancer Maternal Aunt 50   Ovarian cancer Neg Hx    Past Surgical History:  Procedure Laterality Date   COLONOSCOPY WITH PROPOFOL N/A 09/09/2022   Procedure: COLONOSCOPY WITH PROPOFOL;  Surgeon: Jonathon Bellows, MD;  [facility];  Service: Gastroenterology;  Laterality: N/A;   LUMBAR LAMINECTOMY/DECOMPRESSION MICRODISCECTOMY Right 06/02/2014   Procedure: MICRO LUMBAR DECOMPRESSION L4 - L5 ON THE RIGHT 1 LEVEL;  Surgeon: Johnn Hai, MD;  Location: [facility];  Service: Orthopedics;  Laterality: Right;   ORIF  HUMERUS FRACTURE Left 08/01/2017   Procedure: OPEN REDUCTION INTERNAL FIXATION (ORIF) PROXIMAL HUMERUS FRACTURE;  Surgeon: Shona Needles, MD;  Location: [facility];  Service: Orthopedics;  Laterality: Left;   WISDOM TOOTH EXTRACTION     Social History   Occupational History   Not on file  Tobacco Use   Smoking status: Former    Types: Cigarettes    Quit date: 05/26/1990    Years since quitting: 32.3   Smokeless tobacco: Never  Vaping Use   Vaping Use: Never used  Substance and Sexual Activity   Alcohol use: Yes    Comment: occ   Drug use: No   Sexual activity: Not Currently    Partners: Male    Birth control/protection: Post-menopausal

## 2022-09-11 NOTE — Progress Notes (Signed)
Functional Pain Scale - descriptive words and definitions  Distressing (6)    Pain is present/unable to complete most ADLs limited by pain/sleep is difficult and active distraction is only marginal. Moderate range order  Average Pain 4  Lower back pain with radiation in left leg

## 2022-09-17 ENCOUNTER — Ambulatory Visit
Admission: RE | Admit: 2022-09-17 | Discharge: 2022-09-17 | Disposition: A | Discharge status: Home / Self Care | Payer: BC Managed Care – PPO | Source: Ambulatory Visit | Attending: Internal Medicine | Admitting: Internal Medicine

## 2022-09-17 DIAGNOSIS — R921 Mammographic calcification found on diagnostic imaging of breast: Secondary | ICD-10-CM

## 2022-09-17 DIAGNOSIS — N6022 Fibroadenosis of left breast: Secondary | ICD-10-CM | POA: Diagnosis not present

## 2022-09-17 DIAGNOSIS — R928 Other abnormal and inconclusive findings on diagnostic imaging of breast: Secondary | ICD-10-CM | POA: Insufficient documentation

## 2022-09-17 HISTORY — PX: BREAST BIOPSY: SHX20

## 2022-09-17 MED ORDER — LIDOCAINE-EPINEPHRINE 1 %-1:100000 IJ SOLN
10.0000 mL | Freq: Once | INTRAMUSCULAR | Status: AC
  Administered 2022-09-17: 10 mL

## 2022-09-17 MED ORDER — LIDOCAINE HCL (PF) 1 % IJ SOLN
15.0000 mL | Freq: Once | INTRAMUSCULAR | Status: AC
  Administered 2022-09-17: 15 mL

## 2022-09-18 LAB — SURGICAL PATHOLOGY

## 2022-09-24 ENCOUNTER — Telehealth: Payer: Self-pay

## 2022-09-24 NOTE — Telephone Encounter (Signed)
LMOM to have pt CB in regards to self made appt on tomorrow with Bethanie Dicker, NP,.   Mychart msg has been sent informing pt that a provider finds it best that she be seen at an urgent care or ED rather than wait until that appt due to her sx's listed in appt notes.

## 2022-09-24 NOTE — Telephone Encounter (Signed)
Called back again to leave a detailed VM informing pt that due to her sx's being consider an urgent matter, her appt would need to be cancelled so this can be addressed sooner.

## 2022-09-25 ENCOUNTER — Ambulatory Visit: Payer: BC Managed Care – PPO | Admitting: Nurse Practitioner

## 2022-09-25 ENCOUNTER — Encounter: Payer: Self-pay | Admitting: Nurse Practitioner

## 2022-09-25 VITALS — BP 128/80 | HR 88 | Temp 98.3°F | Ht 66.5 in | Wt 363.1 lb

## 2022-09-25 DIAGNOSIS — R112 Nausea with vomiting, unspecified: Secondary | ICD-10-CM

## 2022-09-25 DIAGNOSIS — K529 Noninfective gastroenteritis and colitis, unspecified: Secondary | ICD-10-CM | POA: Diagnosis not present

## 2022-09-25 MED ORDER — ONDANSETRON 4 MG PO TBDP: 4.0000 mg | ORAL_TABLET | Freq: Three times a day (TID) | ORAL | 0 refills | Status: DC | PRN

## 2022-09-25 NOTE — Progress Notes (Unsigned)
Bethanie Dicker, NP-C Phone: [phone]  Danielle Irwin is a 52 y.o. female who presents today for nausea and vomiting.   Patient reports symptoms began yesterday. She has had 2 episodes of vomiting today. Reports 3-4 episodes of diarrhea. Denies abdominal pain. She reports low grade fevers, Tmax 100. Reports fatigue. Denies cough. Denies congestion. Denies sore throat. Denies chest pain and shortness of breath. She has been drinking plenty of fluids. She has been eating bland foods like saltine crackers and toast. She has been taking Zofran and Tylenol for her symptoms with improvement. She is requesting paperwork for her work since she has missed the past 2 days as well as a refill on her Zofran.   Social History   Tobacco Use  Smoking Status Former   Types: Cigarettes   Quit date: 05/26/1990   Years since quitting: 32.3  Smokeless Tobacco Never    Current Outpatient Medications on File Prior to Visit  Medication Sig Dispense Refill   acetaminophen (TYLENOL) 650 MG CR tablet Take 650 mg by mouth every 8 (eight) hours as needed for pain.     albuterol (VENTOLIN HFA) 108 (90 Base) MCG/ACT inhaler Inhale 2 puffs into the lungs every 6 (six) hours as needed for wheezing or shortness of breath. 8 g 2   Alum & Mag Hydroxide-Simeth (ANTACID ANTI-GAS PO) Take 1 tablet by mouth daily as needed (for gas).     Ascorbic Acid (VITAMIN C) 1000 MG tablet Take 1,000 mg by mouth 2 (two) times daily.     aspirin EC 81 MG tablet Take 81 mg by mouth daily. Swallow whole.     aspirin-acetaminophen-caffeine (EXCEDRIN MIGRAINE) 250-250-65 MG tablet Take 2 tablets by mouth 2 (two) times daily as needed for headache or migraine.     b complex vitamins tablet Take 1 tablet by mouth daily.     bismuth subsalicylate (PEPTO-BISMOL) 262 MG/15ML suspension Take 30 mLs by mouth 4 (four) times daily -  before meals and at bedtime. 360 mL 0   budesonide-formoterol (SYMBICORT) 80-4.5 MCG/ACT inhaler Inhale 2 puffs into the  lungs 2 (two) times daily. 1 each 3   Calcium Carbonate-Vitamin D (CALCIUM-D PO) Take 1,200 mg by mouth 2 (two) times daily.     diazepam (VALIUM) 5 MG tablet Take one tablet by mouth with food one hour prior to procedure. May repeat 30 minutes prior if needed. 2 tablet 0   diphenhydrAMINE (BENADRYL) 25 MG tablet Take 25-50 mg by mouth every 6 (six) hours as needed for itching, allergies or sleep.     ECHINACEA-GOLDEN SEAL PO Take 1 tablet by mouth 2 (two) times daily as needed (immune system boost).      EPINEPHrine 0.3 mg/0.3 mL IJ SOAJ injection INJECT 0.3ML INTO THE MUSCLE ONCE AS DIRECTED 2 each 0   fluconazole (DIFLUCAN) 150 MG tablet Take 1 tablet (150 mg total) by mouth every 3 (three) days. 2 tablet 0   furosemide (LASIX) 20 MG tablet Take 1 tablet (20 mg total) by mouth daily. 30 tablet 3   hydrochlorothiazide (HYDRODIURIL) 25 MG tablet Take 25 mg by mouth daily.     lisinopril (ZESTRIL) 10 MG tablet Take 1 tablet (10 mg total) by mouth daily. 90 tablet 0   Magnesium 500 MG TABS Take 500 mg by mouth at bedtime.     Melatonin 10 MG TABS Take 1 tablet by mouth at bedtime.     methocarbamol (ROBAXIN) 500 MG tablet Take 500 mg by mouth.  Multiple Vitamin (MULTIVITAMIN WITH MINERALS) TABS tablet Take 1 tablet by mouth daily.     Multiple Vitamins-Minerals (ZINC PO) Take 500 mg by mouth daily as needed (immune system boost).     nystatin ointment (MYCOSTATIN) Apply 1 Application topically 2 (two) times daily. 30 g 2   sertraline (ZOLOFT) 25 MG tablet Take 25 mg by mouth daily.     SUMAtriptan (IMITREX) 25 MG tablet Take 1 tablet (25 mg total) by mouth as needed for migraine (for migraine). May repeat in 2 hours if headache persists or recurs. 9 tablet 0   traMADol (ULTRAM) 50 MG tablet Take 50 mg by mouth.     traZODone (DESYREL) 50 MG tablet Take 0.5-1 tablets (25-50 mg total) by mouth at bedtime as needed for sleep. 30 tablet 3   No current facility-administered medications on file  prior to visit.     ROS see history of present illness  Objective  Physical Exam Vitals:   09/25/22 1507  BP: 128/80  Pulse: 88  Temp: 98.3 F (36.8 C)  SpO2: 95%    BP Readings from Last 3 Encounters:  09/25/22 128/80  09/09/22 (!) 113/56  09/02/22 110/84   Wt Readings from Last 3 Encounters:  09/25/22 (!) 363 lb 1.9 oz (164.7 kg)  09/09/22 (!) 381 lb (172.8 kg)  09/02/22 (!) 379 lb (171.9 kg)    Physical Exam Constitutional:      General: She is not in acute distress.    Appearance: Normal appearance.  HENT:     Head: Normocephalic.     Right Ear: Tympanic membrane normal.     Left Ear: Tympanic membrane normal.     Nose: Nose normal.     Mouth/Throat:     Mouth: Mucous membranes are moist.     Pharynx: Oropharynx is clear.  Eyes:     Conjunctiva/sclera: Conjunctivae normal.     Pupils: Pupils are equal, round, and reactive to light.  Cardiovascular:     Rate and Rhythm: Normal rate and regular rhythm.     Heart sounds: Normal heart sounds.  Pulmonary:     Effort: Pulmonary effort is normal.     Breath sounds: Normal breath sounds.  Abdominal:     General: Abdomen is flat. Bowel sounds are normal.     Palpations: Abdomen is soft.     Tenderness: There is no abdominal tenderness.  Lymphadenopathy:     Cervical: No cervical adenopathy.  Skin:    General: Skin is warm and dry.  Neurological:     General: No focal deficit present.     Mental Status: She is alert.  Psychiatric:        Mood and Affect: Mood normal.        Behavior: Behavior normal.    Assessment/Plan: Please see individual problem list.  Gastroenteritis Assessment & Plan: Likely viral. Patient on day 2 of symptoms. Improving with Tylenol and Zofran. Refill for Zofran sent. Advised to continue bland diet, she can increase as tolerated. Counseled on importance of oral rehydration, increased risk for dehydration due to symptoms. Encouraged small frequent sips of water. Paperwork  completed for patient to remain off work this week, returning Monday, sooner if able. Strict return precautions given to patient, advised to seek care if she continue to have vomiting and diarrhea, unable to keep fluids or food down or symptoms of dehydration develop.   Orders: -     Ondansetron; Take 1 tablet (4 mg total) by mouth every 8 (  eight) hours as needed for nausea or vomiting.  Dispense: 20 tablet; Refill: 0   Return if symptoms worsen or fail to improve.   Bethanie Dicker, NP-C [facility] [employer]

## 2022-09-26 DIAGNOSIS — K529 Noninfective gastroenteritis and colitis, unspecified: Secondary | ICD-10-CM | POA: Insufficient documentation

## 2022-09-26 NOTE — Assessment & Plan Note (Addendum)
Likely viral. Patient on day 2 of symptoms. Improving with Tylenol and Zofran. Refill for Zofran sent. Advised to continue bland diet, she can increase as tolerated. Counseled on importance of oral rehydration, increased risk for dehydration due to symptoms. Encouraged small frequent sips of water. Paperwork completed for patient to remain off work this week, returning Monday, sooner if able. Strict return precautions given to patient, advised to seek care if she continue to have vomiting and diarrhea, unable to keep fluids or food down or symptoms of dehydration develop.

## 2022-09-27 ENCOUNTER — Encounter: Payer: Self-pay | Admitting: Nurse Practitioner

## 2022-10-20 ENCOUNTER — Ambulatory Visit
Admission: RE | Admit: 2022-10-20 | Discharge: 2022-10-20 | Disposition: A | Discharge status: Home / Self Care | Payer: BC Managed Care – PPO | Source: Ambulatory Visit | Attending: Physical Medicine and Rehabilitation | Admitting: Physical Medicine and Rehabilitation

## 2022-10-20 DIAGNOSIS — M5416 Radiculopathy, lumbar region: Secondary | ICD-10-CM

## 2022-10-20 DIAGNOSIS — M5432 Sciatica, left side: Secondary | ICD-10-CM | POA: Diagnosis not present

## 2022-10-22 ENCOUNTER — Ambulatory Visit: Payer: BC Managed Care – PPO | Admitting: Nurse Practitioner

## 2022-10-22 ENCOUNTER — Encounter: Payer: Self-pay | Admitting: Nurse Practitioner

## 2022-10-22 VITALS — BP 128/84 | HR 82 | Temp 97.8°F | Ht 66.5 in | Wt 371.0 lb

## 2022-10-22 DIAGNOSIS — M545 Low back pain, unspecified: Secondary | ICD-10-CM | POA: Diagnosis not present

## 2022-10-22 DIAGNOSIS — I10 Essential (primary) hypertension: Secondary | ICD-10-CM | POA: Diagnosis not present

## 2022-10-22 MED ORDER — CYCLOBENZAPRINE HCL 10 MG PO TABS: 10.0000 mg | ORAL_TABLET | Freq: Three times a day (TID) | ORAL | 0 refills | Status: DC | PRN

## 2022-10-22 MED ORDER — LISINOPRIL 10 MG PO TABS: 10.0000 mg | ORAL_TABLET | Freq: Every day | ORAL | 0 refills | Status: DC

## 2022-10-22 NOTE — Progress Notes (Signed)
Established Patient Office Visit  Subjective:  Patient ID: Danielle Irwin, female    DOB: 1970/06/12  Age: 52 y.o. MRN: [id number]  CC:  Chief Complaint  Patient presents with   Medical Management of Chronic Issues    HPI  Danielle Irwin presents for back pain    Back Pain This is a chronic problem. The current episode started in the past 7 days. The problem occurs constantly. The problem is unchanged. The quality of the pain is described as burning and stabbing (with movement). The pain radiates to the left foot. The pain is at a severity of 8/10. The pain is moderate. The pain is Worse during the night. The symptoms are aggravated by bending, twisting and sitting. Associated symptoms include leg pain and numbness (more left than right). Risk factors include obesity. She has tried NSAIDs, heat and ice for the symptoms. The treatment provided mild relief.    She is followed by the orthopedics Dr. Aldean Baker had a MRI on Sunday.   She also need paperwork for her work to get frequent breaks for bathroom trips since she is on diuretics and also also wanted to get paperwork completed for leave of absence due to back pain.   Past Medical History:  Diagnosis Date   Asthma    Depression    Family history of adverse reaction to anesthesia    FATHER WAS SLOW TO WAKE UP   Headache    OCCASIONAL MIGRAINES   History of fibromyalgia    HNP (herniated nucleus pulposus), lumbar    Hx of epistaxis    Hypertension    Insomnia    OA (osteoarthritis)    Obesity    PCOS (polycystic ovarian syndrome)    Sensitive skin    Sleep apnea    Vitamin D deficiency     Past Surgical History:  Procedure Laterality Date   BREAST BIOPSY Left 09/17/2022   Stereo bx, Ribbon Clip, path pending   BREAST BIOPSY Left 09/17/2022   MM LT BREAST BX W LOC DEV 1ST LESION IMAGE BX SPEC STEREO GUIDE 09/17/2022 [facility]   COLONOSCOPY WITH PROPOFOL N/A 09/09/2022   Procedure: COLONOSCOPY WITH PROPOFOL;   Surgeon: Wyline Mood, MD;  Location: [facility] ENDOSCOPY;  Service: Gastroenterology;  Laterality: N/A;   LUMBAR LAMINECTOMY/DECOMPRESSION MICRODISCECTOMY Right 06/02/2014   Procedure: MICRO LUMBAR DECOMPRESSION L4 - L5 ON THE RIGHT 1 LEVEL;  Surgeon: Javier Docker, MD;  Location: [facility] ORS;  Service: Orthopedics;  Laterality: Right;   ORIF HUMERUS FRACTURE Left 08/01/2017   Procedure: OPEN REDUCTION INTERNAL FIXATION (ORIF) PROXIMAL HUMERUS FRACTURE;  Surgeon: Roby Lofts, MD;  Location: [facility] OR;  Service: Orthopedics;  Laterality: Left;   WISDOM TOOTH EXTRACTION      Family History  Problem Relation Age of Onset   Heart attack Father 37   Breast cancer Maternal Aunt 50   Ovarian cancer Neg Hx     Social History   Socioeconomic History   Marital status: Single    Spouse name: Not on file   Number of children: Not on file   Years of education: Not on file   Highest education level: Some college, no degree  Occupational History   Not on file  Tobacco Use   Smoking status: Former    Types: Cigarettes    Quit date: 05/26/1990    Years since quitting: 32.4   Smokeless tobacco: Never  Vaping Use   Vaping Use: Never used  Substance and Sexual Activity  Alcohol use: Yes    Comment: occ   Drug use: No   Sexual activity: Not Currently    Partners: Male    Birth control/protection: Post-menopausal  Other Topics Concern   Not on file  Social History Narrative   Not on file   Social Determinants of Health   Financial Resource Strain: High Risk (09/24/2022)   Overall Financial Resource Strain (CARDIA)    Difficulty of Paying Living Expenses: Hard  Food Insecurity: Food Insecurity Present (09/24/2022)   Hunger Vital Sign    Worried About Running Out of Food in the Last Year: Sometimes true    Ran Out of Food in the Last Year: Never true  Transportation Needs: No Transportation Needs (09/24/2022)   PRAPARE - Administrator, Civil Service (Medical): No    Lack of  Transportation (Non-Medical): No  Physical Activity: Unknown (09/24/2022)   Exercise Vital Sign    Days of Exercise per Week: 0 days    Minutes of Exercise per Session: Not on file  Stress: Stress Concern Present (09/24/2022)   [employer] of Occupational Health - Occupational Stress Questionnaire    Feeling of Stress : Rather much  Social Connections: Unknown (09/24/2022)   Social Connection and Isolation Panel [NHANES]    Frequency of Communication with Friends and Family: Patient declined    Frequency of Social Gatherings with Friends and Family: Patient declined    Attends Religious Services: Patient declined    Database administrator or Organizations: No    Attends Engineer, structural: Not on file    Marital Status: Never married  Intimate Partner Violence: Not on file     Outpatient Medications Prior to Visit  Medication Sig Dispense Refill   acetaminophen (TYLENOL) 650 MG CR tablet Take 650 mg by mouth every 8 (eight) hours as needed for pain.     albuterol (VENTOLIN HFA) 108 (90 Base) MCG/ACT inhaler Inhale 2 puffs into the lungs every 6 (six) hours as needed for wheezing or shortness of breath. 8 g 2   Alum & Mag Hydroxide-Simeth (ANTACID ANTI-GAS PO) Take 1 tablet by mouth daily as needed (for gas).     Ascorbic Acid (VITAMIN C) 1000 MG tablet Take 1,000 mg by mouth 2 (two) times daily.     aspirin EC 81 MG tablet Take 81 mg by mouth daily. Swallow whole.     aspirin-acetaminophen-caffeine (EXCEDRIN MIGRAINE) 250-250-65 MG tablet Take 2 tablets by mouth 2 (two) times daily as needed for headache or migraine.     b complex vitamins tablet Take 1 tablet by mouth daily.     bismuth subsalicylate (PEPTO-BISMOL) 262 MG/15ML suspension Take 30 mLs by mouth 4 (four) times daily -  before meals and at bedtime. 360 mL 0   budesonide-formoterol (SYMBICORT) 80-4.5 MCG/ACT inhaler Inhale 2 puffs into the lungs 2 (two) times daily. 1 each 3   Calcium Carbonate-Vitamin D  (CALCIUM-D PO) Take 1,200 mg by mouth 2 (two) times daily.     ECHINACEA-GOLDEN SEAL PO Take 1 tablet by mouth 2 (two) times daily as needed (immune system boost).      EPINEPHrine 0.3 mg/0.3 mL IJ SOAJ injection INJECT 0.3ML INTO THE MUSCLE ONCE AS DIRECTED 2 each 0   fluconazole (DIFLUCAN) 150 MG tablet Take 1 tablet (150 mg total) by mouth every 3 (three) days. 2 tablet 0   furosemide (LASIX) 20 MG tablet Take 1 tablet (20 mg total) by mouth daily. 30 tablet 3  hydrochlorothiazide (HYDRODIURIL) 25 MG tablet Take 25 mg by mouth daily.     Magnesium 500 MG TABS Take 500 mg by mouth at bedtime.     Melatonin 10 MG TABS Take 1 tablet by mouth at bedtime.     Multiple Vitamin (MULTIVITAMIN WITH MINERALS) TABS tablet Take 1 tablet by mouth daily.     Multiple Vitamins-Minerals (ZINC PO) Take 500 mg by mouth daily as needed (immune system boost).     nystatin ointment (MYCOSTATIN) Apply 1 Application topically 2 (two) times daily. 30 g 2   ondansetron (ZOFRAN ODT) 4 MG disintegrating tablet Take 1 tablet (4 mg total) by mouth every 8 (eight) hours as needed for nausea or vomiting. 20 tablet 0   sertraline (ZOLOFT) 25 MG tablet Take 25 mg by mouth daily.     SUMAtriptan (IMITREX) 25 MG tablet Take 1 tablet (25 mg total) by mouth as needed for migraine (for migraine). May repeat in 2 hours if headache persists or recurs. 9 tablet 0   traMADol (ULTRAM) 50 MG tablet Take 50 mg by mouth.     traZODone (DESYREL) 50 MG tablet Take 0.5-1 tablets (25-50 mg total) by mouth at bedtime as needed for sleep. 30 tablet 3   lisinopril (ZESTRIL) 10 MG tablet Take 1 tablet (10 mg total) by mouth daily. 90 tablet 0   diazepam (VALIUM) 5 MG tablet Take one tablet by mouth with food one hour prior to procedure. May repeat 30 minutes prior if needed. 2 tablet 0   diphenhydrAMINE (BENADRYL) 25 MG tablet Take 25-50 mg by mouth every 6 (six) hours as needed for itching, allergies or sleep.     methocarbamol (ROBAXIN) 500 MG  tablet Take 500 mg by mouth.     No facility-administered medications prior to visit.    Allergies  Allergen Reactions   Benzalkonium Chloride Anaphylaxis and Hives   Neosporin [Neomycin-Bacitracin Zn-Polymyx] Anaphylaxis and Hives   Polysporin [Bacitracin-Polymyxin B] Hives   Bee Venom Hives and Swelling    Throat swelling   Bupropion Hcl Other (See Comments)    angry   Cephalexin Hives and Swelling   Latex     ROS Review of Systems  Musculoskeletal:  Positive for back pain.  Neurological:  Positive for numbness (more left than right).  All other systems reviewed and are negative.     Objective:    Physical Exam Constitutional:      Appearance: Normal appearance. She is obese.  Eyes:     Extraocular Movements: Extraocular movements intact.     Conjunctiva/sclera: Conjunctivae normal.     Pupils: Pupils are equal, round, and reactive to light.  Cardiovascular:     Rate and Rhythm: Normal rate and regular rhythm.     Pulses: Normal pulses.     Heart sounds: Normal heart sounds.  Pulmonary:     Effort: Pulmonary effort is normal. No respiratory distress.     Breath sounds: Normal breath sounds. No rhonchi.  Musculoskeletal:        General: Normal range of motion.     Cervical back: Neck supple. No tenderness.  Skin:    General: Skin is warm.     Capillary Refill: Capillary refill takes less than 2 seconds.  Neurological:     General: No focal deficit present.     Mental Status: She is alert and oriented to person, place, and time. Mental status is at baseline.  Psychiatric:        Mood and Affect: Mood  normal.        Behavior: Behavior normal.        Thought Content: Thought content normal.        Judgment: Judgment normal.     BP 128/84   Pulse 82   Temp 97.8 F (36.6 C)   Ht 5' 6.5" (1.689 m)   Wt (!) 371 lb (168.3 kg)   LMP 07/11/2016   SpO2 95%   BMI 58.98 kg/m  Wt Readings from Last 3 Encounters:  10/22/22 (!) 371 lb (168.3 kg)  09/25/22 (!)  363 lb 1.9 oz (164.7 kg)  09/09/22 (!) 381 lb (172.8 kg)     Health Maintenance  Topic Date Due   Hepatitis C Screening  Never done   Zoster Vaccines- Shingrix (1 of 2) Never done   COVID-19 Vaccine (1) 11/07/2022 (Originally 01/31/1971)   MAMMOGRAM  09/05/2024   PAP SMEAR-Modifier  09/01/2025   DTaP/Tdap/Td (2 - Td or Tdap) 06/25/2027   Colonoscopy  09/08/2032   HIV Screening  Completed   HPV VACCINES  Aged Out   INFLUENZA VACCINE  Discontinued    There are no preventive care reminders to display for this patient.  Lab Results  Component Value Date   TSH 4.25 07/18/2022   Lab Results  Component Value Date   WBC 7.6 07/18/2022   HGB 14.3 07/18/2022   HCT 41.8 07/18/2022   MCV 94.8 07/18/2022   PLT 239.0 07/18/2022   Lab Results  Component Value Date   NA 141 07/18/2022   K 4.2 07/18/2022   CO2 26 07/18/2022   GLUCOSE 122 (H) 07/18/2022   BUN 16 07/18/2022   CREATININE 0.77 07/18/2022   BILITOT 1.1 07/18/2022   ALKPHOS 46 07/18/2022   AST 30 07/18/2022   ALT 40 (H) 07/18/2022   PROT 6.9 07/18/2022   ALBUMIN 4.3 07/18/2022   CALCIUM 9.1 07/18/2022   ANIONGAP 10 08/02/2017   GFR 88.98 07/18/2022   Lab Results  Component Value Date   CHOL 224 (H) 07/18/2022   Lab Results  Component Value Date   HDL 46.70 07/18/2022   Lab Results  Component Value Date   LDLCALC 159 (H) 07/18/2022   Lab Results  Component Value Date   TRIG 93.0 07/18/2022   Lab Results  Component Value Date   CHOLHDL 5 07/18/2022   Lab Results  Component Value Date   HGBA1C 6.0 07/18/2022      Assessment & Plan:  Acute midline low back pain without sciatica Assessment & Plan: She is followed by orthopedics. Refilled Flexeril. Will complete the medical leave form and inform the patient.   Primary hypertension Assessment & Plan: Patient BP  Vitals:   10/22/22 1628  BP: 128/84    in the office. Advised pt to follow a low sodium and heart healthy diet. Continue the  current medication regimen. Will complete the paperwork for work    Orders: -     Lisinopril; Take 1 tablet (10 mg total) by mouth daily.  Dispense: 90 tablet; Refill: 0  Other orders -     Cyclobenzaprine HCl; Take 1 tablet (10 mg total) by mouth 3 (three) times daily as needed for muscle spasms.  Dispense: 30 tablet; Refill: 0    Follow-up: No follow-ups on file.   Kara Dies, NP

## 2022-10-25 ENCOUNTER — Telehealth: Payer: Self-pay | Admitting: Physical Medicine and Rehabilitation

## 2022-10-25 ENCOUNTER — Encounter: Payer: Self-pay | Admitting: Nurse Practitioner

## 2022-10-25 NOTE — Telephone Encounter (Signed)
Called patient this afternoon to discuss recent lumbar MRI findings. No answer. Mailbox was full, unable to leave message. There is left paracentral disc protrusion contacting left S1 nerve root. Would recommend repeating left S1 transforaminal epidural steroid injection. We will wait for call back from patient to place order.

## 2022-10-25 NOTE — Telephone Encounter (Signed)
Pt returned call to PA [facility]. Pt asked if PA Aundra Millet can call her before 12 pm. Anytime after that she is at work and unable to answer. Pt phone is [phone].

## 2022-11-04 NOTE — Assessment & Plan Note (Signed)
Patient BP  Vitals:   10/22/22 1628  BP: 128/84    in the office. Advised pt to follow a low sodium and heart healthy diet. Continue the current medication regimen. Will complete the paperwork for work

## 2022-11-04 NOTE — Assessment & Plan Note (Signed)
She is followed by orthopedics. Refilled Flexeril. Will complete the medical leave form and inform the patient.

## 2022-11-07 ENCOUNTER — Telehealth: Payer: Self-pay | Admitting: Physical Medicine and Rehabilitation

## 2022-11-07 NOTE — Telephone Encounter (Signed)
Called patient this morning to discuss recent lumbar MRI imaging. No answer, I left VM for her to call back.

## 2022-12-04 ENCOUNTER — Telehealth: Payer: Self-pay

## 2022-12-04 DIAGNOSIS — M5416 Radiculopathy, lumbar region: Secondary | ICD-10-CM

## 2022-12-04 NOTE — Telephone Encounter (Signed)
Order for S1 transforaminal epidural injection placed

## 2022-12-06 ENCOUNTER — Ambulatory Visit: Payer: BC Managed Care – PPO | Admitting: Nurse Practitioner

## 2022-12-10 ENCOUNTER — Ambulatory Visit: Payer: BC Managed Care – PPO | Admitting: Nurse Practitioner

## 2022-12-13 ENCOUNTER — Ambulatory Visit: Payer: BC Managed Care – PPO | Admitting: Nurse Practitioner

## 2022-12-18 ENCOUNTER — Telehealth: Payer: Self-pay | Admitting: Physical Medicine and Rehabilitation

## 2022-12-18 NOTE — Telephone Encounter (Signed)
Spoke with patient and scheduled injection for 12/30/22. Patient aware driver needed 

## 2022-12-18 NOTE — Telephone Encounter (Signed)
Patient returned call to schedule an appointment with Dr. Alvester Morin. The number to contact patient is [phone]

## 2022-12-19 ENCOUNTER — Ambulatory Visit: Payer: BC Managed Care – PPO | Admitting: Primary Care

## 2022-12-19 VITALS — BP 124/88 | HR 103 | Temp 98.9°F | Ht 66.5 in | Wt 360.0 lb

## 2022-12-19 DIAGNOSIS — J029 Acute pharyngitis, unspecified: Secondary | ICD-10-CM | POA: Diagnosis not present

## 2022-12-19 DIAGNOSIS — R112 Nausea with vomiting, unspecified: Secondary | ICD-10-CM

## 2022-12-19 LAB — CBC WITH DIFFERENTIAL/PLATELET
Basophils Absolute: 0 10*3/uL (ref 0.0–0.1)
Basophils Relative: 0.6 % (ref 0.0–3.0)
Eosinophils Absolute: 0 10*3/uL (ref 0.0–0.7)
Eosinophils Relative: 0.7 % (ref 0.0–5.0)
HCT: 45.3 % (ref 36.0–46.0)
Hemoglobin: 15.1 g/dL — ABNORMAL HIGH (ref 12.0–15.0)
Lymphocytes Relative: 37.9 % (ref 12.0–46.0)
Lymphs Abs: 2.6 10*3/uL (ref 0.7–4.0)
MCHC: 33.3 g/dL (ref 30.0–36.0)
MCV: 93.6 fl (ref 78.0–100.0)
Monocytes Absolute: 0.5 10*3/uL (ref 0.1–1.0)
Monocytes Relative: 7.5 % (ref 3.0–12.0)
Neutro Abs: 3.7 10*3/uL (ref 1.4–7.7)
Neutrophils Relative %: 53.3 % (ref 43.0–77.0)
Platelets: 260 10*3/uL (ref 150.0–400.0)
RBC: 4.84 Mil/uL (ref 3.87–5.11)
RDW: 12.6 % (ref 11.5–15.5)
WBC: 7 10*3/uL (ref 4.0–10.5)

## 2022-12-19 LAB — POC COVID19 BINAXNOW: SARS Coronavirus 2 Ag: NEGATIVE

## 2022-12-19 LAB — COMPREHENSIVE METABOLIC PANEL
ALT: 34 U/L (ref 0–35)
AST: 28 U/L (ref 0–37)
Albumin: 4.6 g/dL (ref 3.5–5.2)
Alkaline Phosphatase: 40 U/L (ref 39–117)
BUN: 13 mg/dL (ref 6–23)
CO2: 25 mEq/L (ref 19–32)
Calcium: 9.7 mg/dL (ref 8.4–10.5)
Chloride: 103 mEq/L (ref 96–112)
Creatinine, Ser: 0.95 mg/dL (ref 0.40–1.20)
GFR: 68.95 mL/min (ref >60.00)
Glucose, Bld: 136 mg/dL — ABNORMAL HIGH (ref 70–99)
Potassium: 4.5 mEq/L (ref 3.5–5.1)
Sodium: 137 mEq/L (ref 135–145)
Total Bilirubin: 1.8 mg/dL — ABNORMAL HIGH (ref 0.2–1.2)
Total Protein: 7.9 g/dL (ref 6.0–8.3)

## 2022-12-19 LAB — POCT INFLUENZA A/B
Influenza A, POC: NEGATIVE
Influenza B, POC: NEGATIVE

## 2022-12-19 LAB — MONONUCLEOSIS SCREEN: Mono Screen: NEGATIVE

## 2022-12-19 LAB — POCT RAPID STREP A (OFFICE): Rapid Strep A Screen: POSITIVE — AB

## 2022-12-19 MED ORDER — ONDANSETRON 4 MG PO TBDP
4.0000 mg | ORAL_TABLET | Freq: Three times a day (TID) | ORAL | 0 refills | Status: DC | PRN
Start: 2022-12-19 — End: 2023-01-03

## 2022-12-19 NOTE — Assessment & Plan Note (Addendum)
Without apparent location.  Presumed viral etiology at this point.  Rapid COVID testing negative. Interestingly, she did have a faintly positive rapid strep test today.  Exam and HPI today not representative of strep pharyngitis, so we will send off a throat culture for confirmation.  CMP, CBC with differential, mono test ordered and pending. Continue Zofran 4 mg ODT as needed.  Refills provided.  Discussed the importance of proper hydration. Advance diet slowly as tolerated.  Close follow-up with PCP if no improvement. Paperwork completed today to excuse from work for a return to work date of 12/23/22

## 2022-12-19 NOTE — Patient Instructions (Addendum)
Stop by the lab prior to leaving today. I will notify you of your results once received.   You can take the Zofran as needed for nausea/vomiting.  I will be in touch again shortly with the throat culture results.  It was a pleasure meeting you!

## 2022-12-19 NOTE — Progress Notes (Signed)
Subjective:    Patient ID: Danielle Irwin, female    DOB: Nov 03, 1970, 52 y.o.   MRN: [id number]  Cough Associated symptoms include headaches and postnasal drip. Pertinent negatives include no sore throat.    Danielle Irwin is a very pleasant 52 y.o. female patient of Ms. Evelene Croon, NP with a history of hypertension, DVT, migraines, asthma, bronchitis, sinusitis, OSA on CPAP who presents today to discuss nausea and vomiting.   Symptom onset 5 days ago with fatigue and scratchy throat. She then developed a bandlike pressure headache and also left temporal headache, nausea, and vomiting three days ago.   Since then she's vomited anytime she's tried to eat. She is able to take small sips of Ginger Ale without vomiting, but cannot tolerate solid food.   She's been taking Zofran 2-3 times daily with temporary improvement. She did try to take Pepto Bismol a few days ago but threw up.   She has noticed softer stools, some esophageal reflux. She denies diarrhea, abdominal pain, increased cough, increased congestion, sore throat. She ate a small chicken breast and mashed potatoes for which she threw back up about 20 minutes later.   Today she's feeling better as her headaches have improved.   She is also needing paperwork completed to excuse her from work since she's been sick. She cannot function at work given her nausea, headaches, vomiting, and brain fog.   Review of Systems  Constitutional:  Positive for fatigue.  HENT:  Positive for congestion and postnasal drip. Negative for sore throat.   Respiratory:  Positive for cough.   Gastrointestinal:  Positive for nausea and vomiting. Negative for abdominal pain and diarrhea.  Neurological:  Positive for headaches.         Past Medical History:  Diagnosis Date   Asthma    Depression    Family history of adverse reaction to anesthesia    FATHER WAS SLOW TO WAKE UP   Headache    OCCASIONAL MIGRAINES   History of fibromyalgia    HNP (herniated  nucleus pulposus), lumbar    Hx of epistaxis    Hypertension    Insomnia    OA (osteoarthritis)    Obesity    PCOS (polycystic ovarian syndrome)    Sensitive skin    Sleep apnea    Vitamin D deficiency     Social History   Socioeconomic History   Marital status: Single    Spouse name: Not on file   Number of children: Not on file   Years of education: Not on file   Highest education level: Some college, no degree  Occupational History   Not on file  Tobacco Use   Smoking status: Former    Current packs/day: 0.00    Types: Cigarettes    Quit date: 05/26/1990    Years since quitting: 32.5   Smokeless tobacco: Never  Vaping Use   Vaping status: Never Used  Substance and Sexual Activity   Alcohol use: Yes    Comment: occ   Drug use: No   Sexual activity: Not Currently    Partners: Male    Birth control/protection: Post-menopausal  Other Topics Concern   Not on file  Social History Narrative   Not on file   Social Determinants of Health   Financial Resource Strain: High Risk (09/24/2022)   Overall Financial Resource Strain (CARDIA)    Difficulty of Paying Living Expenses: Hard  Food Insecurity: Food Insecurity Present (09/24/2022)   Hunger Vital Sign  Worried About Programme researcher, broadcasting/film/video in the Last Year: Sometimes true    [employer] of Food in the Last Year: Never true  Transportation Needs: No Transportation Needs (09/24/2022)   PRAPARE - Administrator, Civil Service (Medical): No    Lack of Transportation (Non-Medical): No  Physical Activity: Unknown (09/24/2022)   Exercise Vital Sign    Days of Exercise per Week: 0 days    Minutes of Exercise per Session: Not on file  Stress: Stress Concern Present (09/24/2022)   [employer] of Occupational Health - Occupational Stress Questionnaire    Feeling of Stress : Rather much  Social Connections: Unknown (09/24/2022)   Social Connection and Isolation Panel [NHANES]    Frequency of Communication with  Friends and Family: Patient declined    Frequency of Social Gatherings with Friends and Family: Patient declined    Attends Religious Services: Patient declined    Active Member of Clubs or Organizations: No    Attends Engineer, structural: Not on file    Marital Status: Never married  Intimate Partner Violence: Not on file    Past Surgical History:  Procedure Laterality Date   BREAST BIOPSY Left 09/17/2022   Stereo bx, Ribbon Clip, path pending   BREAST BIOPSY Left 09/17/2022   MM LT BREAST BX W LOC DEV 1ST LESION IMAGE BX SPEC STEREO GUIDE 09/17/2022 [facility]   COLONOSCOPY WITH PROPOFOL N/A 09/09/2022   Procedure: COLONOSCOPY WITH PROPOFOL;  Surgeon: Wyline Mood, MD;  Location: [facility] ENDOSCOPY;  Service: Gastroenterology;  Laterality: N/A;   LUMBAR LAMINECTOMY/DECOMPRESSION MICRODISCECTOMY Right 06/02/2014   Procedure: MICRO LUMBAR DECOMPRESSION L4 - L5 ON THE RIGHT 1 LEVEL;  Surgeon: Javier Docker, MD;  Location: [facility] ORS;  Service: Orthopedics;  Laterality: Right;   ORIF HUMERUS FRACTURE Left 08/01/2017   Procedure: OPEN REDUCTION INTERNAL FIXATION (ORIF) PROXIMAL HUMERUS FRACTURE;  Surgeon: Roby Lofts, MD;  Location: [facility] OR;  Service: Orthopedics;  Laterality: Left;   WISDOM TOOTH EXTRACTION      Family History  Problem Relation Age of Onset   Heart attack Father 83   Breast cancer Maternal Aunt 50   Ovarian cancer Neg Hx     Allergies  Allergen Reactions   Benzalkonium Chloride Anaphylaxis and Hives   Neosporin [Neomycin-Bacitracin Zn-Polymyx] Anaphylaxis and Hives   Polysporin [Bacitracin-Polymyxin B] Hives   Bee Venom Hives and Swelling    Throat swelling   Bupropion Hcl Other (See Comments)    angry   Cephalexin Hives and Swelling   Latex     Current Outpatient Medications on File Prior to Visit  Medication Sig Dispense Refill   acetaminophen (TYLENOL) 650 MG CR tablet Take 650 mg by mouth every 8 (eight) hours as needed for pain.     albuterol  (VENTOLIN HFA) 108 (90 Base) MCG/ACT inhaler Inhale 2 puffs into the lungs every 6 (six) hours as needed for wheezing or shortness of breath. 8 g 2   Alum & Mag Hydroxide-Simeth (ANTACID ANTI-GAS PO) Take 1 tablet by mouth daily as needed (for gas).     Ascorbic Acid (VITAMIN C) 1000 MG tablet Take 1,000 mg by mouth 2 (two) times daily.     aspirin EC 81 MG tablet Take 81 mg by mouth daily. Swallow whole.     aspirin-acetaminophen-caffeine (EXCEDRIN MIGRAINE) 250-250-65 MG tablet Take 2 tablets by mouth 2 (two) times daily as needed for headache or migraine.     b complex vitamins tablet Take 1  tablet by mouth daily.     bismuth subsalicylate (PEPTO-BISMOL) 262 MG/15ML suspension Take 30 mLs by mouth 4 (four) times daily -  before meals and at bedtime. 360 mL 0   budesonide-formoterol (SYMBICORT) 80-4.5 MCG/ACT inhaler Inhale 2 puffs into the lungs 2 (two) times daily. 1 each 3   Calcium Carbonate-Vitamin D (CALCIUM-D PO) Take 1,200 mg by mouth 2 (two) times daily.     ECHINACEA-GOLDEN SEAL PO Take 1 tablet by mouth 2 (two) times daily as needed (immune system boost).      EPINEPHrine 0.3 mg/0.3 mL IJ SOAJ injection INJECT 0.3ML INTO THE MUSCLE ONCE AS DIRECTED 2 each 0   fluconazole (DIFLUCAN) 150 MG tablet Take 1 tablet (150 mg total) by mouth every 3 (three) days. 2 tablet 0   furosemide (LASIX) 20 MG tablet Take 1 tablet (20 mg total) by mouth daily. 30 tablet 3   hydrochlorothiazide (HYDRODIURIL) 25 MG tablet Take 25 mg by mouth daily.     lisinopril (ZESTRIL) 10 MG tablet Take 1 tablet (10 mg total) by mouth daily. 90 tablet 0   Magnesium 500 MG TABS Take 500 mg by mouth at bedtime.     Melatonin 10 MG TABS Take 1 tablet by mouth at bedtime.     Multiple Vitamin (MULTIVITAMIN WITH MINERALS) TABS tablet Take 1 tablet by mouth daily.     Multiple Vitamins-Minerals (ZINC PO) Take 500 mg by mouth daily as needed (immune system boost).     nystatin ointment (MYCOSTATIN) Apply 1 Application  topically 2 (two) times daily. 30 g 2   sertraline (ZOLOFT) 25 MG tablet Take 25 mg by mouth daily.     SUMAtriptan (IMITREX) 25 MG tablet Take 1 tablet (25 mg total) by mouth as needed for migraine (for migraine). May repeat in 2 hours if headache persists or recurs. 9 tablet 0   traMADol (ULTRAM) 50 MG tablet Take 50 mg by mouth.     traZODone (DESYREL) 50 MG tablet Take 0.5-1 tablets (25-50 mg total) by mouth at bedtime as needed for sleep. 30 tablet 3   cyclobenzaprine (FLEXERIL) 10 MG tablet Take 1 tablet (10 mg total) by mouth 3 (three) times daily as needed for muscle spasms. (Patient not taking: Reported on 12/19/2022) 30 tablet 0   No current facility-administered medications on file prior to visit.    BP 124/88   Pulse (!) 103   Temp 98.9 F (37.2 C) (Temporal)   Ht 5' 6.5" (1.689 m)   Wt (!) 360 lb (163.3 kg)   LMP 07/11/2016   SpO2 94%   BMI 57.24 kg/m  Objective:   Physical Exam HENT:     Right Ear: Tympanic membrane and ear canal normal.     Left Ear: Tympanic membrane and ear canal normal.     Nose:     Right Sinus: No maxillary sinus tenderness or frontal sinus tenderness.     Left Sinus: No maxillary sinus tenderness or frontal sinus tenderness.     Mouth/Throat:     Pharynx: No posterior oropharyngeal erythema.  Eyes:     Conjunctiva/sclera: Conjunctivae normal.  Cardiovascular:     Rate and Rhythm: Normal rate and regular rhythm.  Pulmonary:     Effort: Pulmonary effort is normal.     Breath sounds: Normal breath sounds. No wheezing or rales.  Abdominal:     General: Bowel sounds are normal.     Palpations: Abdomen is soft.     Tenderness: There is no  abdominal tenderness.  Musculoskeletal:     Cervical back: Neck supple.  Lymphadenopathy:     Cervical: No cervical adenopathy.  Skin:    General: Skin is warm and dry.           Assessment & Plan:  Nausea and vomiting, unspecified vomiting type Assessment & Plan: Without apparent location.   Presumed viral etiology at this point.  Rapid COVID testing negative. Interestingly, she did have a faintly positive rapid strep test today.  Exam and HPI today not representative of strep pharyngitis, so we will send off a throat culture for confirmation.  CMP, CBC with differential, mono test ordered and pending. Continue Zofran 4 mg ODT as needed.  Refills provided.  Discussed the importance of proper hydration. Advance diet slowly as tolerated.  Close follow-up with PCP if no improvement. Paperwork completed today to excuse from work for a return to work date of 12/23/22  Orders: -     POC COVID-19 BinaxNow -     Ondansetron; Take 1 tablet (4 mg total) by mouth every 8 (eight) hours as needed for nausea or vomiting.  Dispense: 20 tablet; Refill: 0 -     CBC with Differential/Platelet -     Mononucleosis screen -     Comprehensive metabolic panel  Sore throat -     POCT rapid strep A -     Culture, Group A Strep        Doreene Nest, NP

## 2022-12-20 NOTE — Telephone Encounter (Signed)
Was seen by Natalia Leatherwood, NP yesterday.

## 2022-12-21 LAB — CULTURE, GROUP A STREP
MICRO NUMBER:: 15188150
SPECIMEN QUALITY:: ADEQUATE

## 2022-12-27 ENCOUNTER — Telehealth: Payer: Self-pay | Admitting: Nurse Practitioner

## 2022-12-27 NOTE — Telephone Encounter (Signed)
I did complete paperwork for which was faxed last week.  Can we check Danielle Irwin's files?

## 2022-12-27 NOTE — Telephone Encounter (Signed)
Pt called to let Danielle Irwin know that her employer stated they only received 1/5 pages of ppw signed by Danielle Irwin for her leave of absence. Pt asked if ppw can be resent? Call back # [phone]

## 2022-12-27 NOTE — Telephone Encounter (Signed)
Do not see ppw did you fill out or did pcp

## 2022-12-30 ENCOUNTER — Ambulatory Visit: Payer: BC Managed Care – PPO | Admitting: Physical Medicine and Rehabilitation

## 2022-12-30 ENCOUNTER — Other Ambulatory Visit: Payer: Self-pay

## 2022-12-30 VITALS — BP 142/86 | HR 99

## 2022-12-30 DIAGNOSIS — M5416 Radiculopathy, lumbar region: Secondary | ICD-10-CM | POA: Diagnosis not present

## 2022-12-30 MED ORDER — METHYLPREDNISOLONE ACETATE 80 MG/ML IJ SUSP
80.0000 mg | Freq: Once | INTRAMUSCULAR | Status: AC
  Administered 2022-12-30: 80 mg

## 2022-12-30 NOTE — Progress Notes (Signed)
Functional Pain Scale - descriptive words and definitions  Distressing (6)    Pain is present/unable to complete most ADLs limited by pain/sleep is difficult and active distraction is only marginal. Moderate range order  Average Pain 4   +Driver, -BT, -Dye Allergies.  Lower back pain with radiation in the legs, left worst than right

## 2022-12-30 NOTE — Patient Instructions (Signed)

## 2022-12-31 NOTE — Telephone Encounter (Signed)
Danielle Irwin, can you look to see if you have copies of her paperwork in your desk or up front?

## 2022-12-31 NOTE — Telephone Encounter (Signed)
See my chart encounter.

## 2022-12-31 NOTE — Telephone Encounter (Signed)
Unable to reach patient. Left voicemail to return call to our office.   

## 2023-01-01 ENCOUNTER — Telehealth: Payer: Self-pay | Admitting: Nurse Practitioner

## 2023-01-01 NOTE — Telephone Encounter (Signed)
Type of forms received:Disability paperwork   Routed WG:NFAOZ Pool  Paperwork received by :    Individual made aware of 3-5 business day turn around (Y/N): Y  Form completed and patient made aware of charges(Y/N): Y   Faxed to :   Form location: Place in PCP folder

## 2023-01-01 NOTE — Telephone Encounter (Signed)
Completed paperwork again. Please clarify with patient the start date of her leave on page 5 and complete. Okay to fax forms thereafter.

## 2023-01-02 DIAGNOSIS — Z0289 Encounter for other administrative examinations: Secondary | ICD-10-CM

## 2023-01-03 ENCOUNTER — Encounter: Payer: Self-pay | Admitting: Nurse Practitioner

## 2023-01-03 ENCOUNTER — Ambulatory Visit: Payer: BC Managed Care – PPO | Admitting: Nurse Practitioner

## 2023-01-03 VITALS — BP 130/74 | HR 77 | Temp 98.2°F | Resp 16 | Ht 66.5 in | Wt 364.2 lb

## 2023-01-03 DIAGNOSIS — F32A Depression, unspecified: Secondary | ICD-10-CM

## 2023-01-03 DIAGNOSIS — F419 Anxiety disorder, unspecified: Secondary | ICD-10-CM

## 2023-01-03 DIAGNOSIS — G4700 Insomnia, unspecified: Secondary | ICD-10-CM | POA: Diagnosis not present

## 2023-01-03 DIAGNOSIS — R112 Nausea with vomiting, unspecified: Secondary | ICD-10-CM | POA: Diagnosis not present

## 2023-01-03 MED ORDER — ONDANSETRON 4 MG PO TBDP
4.0000 mg | ORAL_TABLET | Freq: Three times a day (TID) | ORAL | 0 refills | Status: DC | PRN
Start: 2023-01-03 — End: 2023-01-30

## 2023-01-03 MED ORDER — HYDROXYZINE PAMOATE 25 MG PO CAPS: 25.0000 mg | ORAL_CAPSULE | Freq: Three times a day (TID) | ORAL | 0 refills | Status: DC | PRN

## 2023-01-03 MED ORDER — CYCLOBENZAPRINE HCL 10 MG PO TABS: 10.0000 mg | ORAL_TABLET | Freq: Three times a day (TID) | ORAL | 0 refills | Status: DC | PRN

## 2023-01-03 MED ORDER — TRAZODONE HCL 100 MG PO TABS: 100.0000 mg | ORAL_TABLET | Freq: Every day | ORAL | 1 refills | Status: DC

## 2023-01-03 NOTE — Progress Notes (Signed)
Established Patient Office Visit  Subjective:  Patient ID: Danielle Irwin, female    DOB: 1970/07/03  Age: 52 y.o. MRN: [id number]  CC:  Chief Complaint  Patient presents with   Anxiety   Depression    Worse over the last 2 months can't sleep and does not want to do anything in the house or going out.    HPI  Danielle Irwin presents for anxiety, depression, fatigue and sleeping issues. She has problem falling asleep and staying in sleep.  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 Had been bullied in the school. She had tried bupropion but had sideeffect.   She has OSA and use CPAP machine.    She also have off and on nausea and vomiting. She had 3 diarrhea today.    Past Medical History:  Diagnosis Date   Asthma    Depression    Family history of adverse reaction to anesthesia    FATHER WAS SLOW TO WAKE UP   Headache    OCCASIONAL MIGRAINES   History of fibromyalgia    HNP (herniated nucleus pulposus), lumbar    Hx of epistaxis    Hypertension    Insomnia    OA (osteoarthritis)    Obesity    PCOS (polycystic ovarian syndrome)    Sensitive skin    Sleep apnea    Vitamin D deficiency     Past Surgical History:  Procedure Laterality Date   BREAST BIOPSY Left 09/17/2022   Stereo bx, Ribbon Clip, path pending   BREAST BIOPSY Left 09/17/2022   MM LT BREAST BX W LOC DEV 1ST LESION IMAGE BX SPEC STEREO GUIDE 09/17/2022 [facility]   COLONOSCOPY WITH PROPOFOL N/A 09/09/2022   Procedure: COLONOSCOPY WITH PROPOFOL;  Surgeon: Wyline Mood, MD;  Location: [facility] ENDOSCOPY;  Service: Gastroenterology;  Laterality: N/A;   LUMBAR LAMINECTOMY/DECOMPRESSION MICRODISCECTOMY Right 06/02/2014   Procedure: MICRO LUMBAR DECOMPRESSION L4 - L5 ON THE RIGHT 1 LEVEL;  Surgeon: Javier Docker, MD;  Location: [facility] ORS;  Service: Orthopedics;  Laterality: Right;   ORIF HUMERUS FRACTURE Left 08/01/2017    Procedure: OPEN REDUCTION INTERNAL FIXATION (ORIF) PROXIMAL HUMERUS FRACTURE;  Surgeon: Roby Lofts, MD;  Location: [facility] OR;  Service: Orthopedics;  Laterality: Left;   WISDOM TOOTH EXTRACTION      Family History  Problem Relation Age of Onset   Heart attack Father 33   Breast cancer Maternal Aunt 50   Ovarian cancer Neg Hx     Social History   Socioeconomic History   Marital status: Single    Spouse name: Not on file   Number of children: Not on file   Years of education: Not on file   Highest education level: Some college, no degree  Occupational History   Not on file  Tobacco Use   Smoking status: Former    Current packs/day: 0.00    Types: Cigarettes    Quit date: 05/26/1990    Years since quitting: 32.6   Smokeless tobacco: Never  Vaping Use   Vaping status: Never Used  Substance and Sexual Activity   Alcohol use: Yes    Comment: occ   Drug use: No   Sexual activity: Not Currently    Partners: Male    Birth control/protection: Post-menopausal  Other Topics Concern   Not on file  Social History Narrative   Not on file   Social  Determinants of Health   Financial Resource Strain: High Risk (09/24/2022)   Overall Financial Resource Strain (CARDIA)    Difficulty of Paying Living Expenses: Hard  Food Insecurity: Food Insecurity Present (09/24/2022)   Hunger Vital Sign    Worried About Running Out of Food in the Last Year: Sometimes true    Ran Out of Food in the Last Year: Never true  Transportation Needs: No Transportation Needs (09/24/2022)   PRAPARE - Administrator, Civil Service (Medical): No    Lack of Transportation (Non-Medical): No  Physical Activity: Unknown (09/24/2022)   Exercise Vital Sign    Days of Exercise per Week: 0 days    Minutes of Exercise per Session: Not on file  Stress: Stress Concern Present (09/24/2022)   [employer] of Occupational Health - Occupational Stress Questionnaire    Feeling of Stress : Rather much   Social Connections: Unknown (09/24/2022)   Social Connection and Isolation Panel [NHANES]    Frequency of Communication with Friends and Family: Patient declined    Frequency of Social Gatherings with Friends and Family: Patient declined    Attends Religious Services: Patient declined    Database administrator or Organizations: No    Attends Engineer, structural: Not on file    Marital Status: Never married  Intimate Partner Violence: Not on file     Outpatient Medications Prior to Visit  Medication Sig Dispense Refill   acetaminophen (TYLENOL) 650 MG CR tablet Take 650 mg by mouth every 8 (eight) hours as needed for pain.     albuterol (VENTOLIN HFA) 108 (90 Base) MCG/ACT inhaler Inhale 2 puffs into the lungs every 6 (six) hours as needed for wheezing or shortness of breath. 8 g 2   Alum & Mag Hydroxide-Simeth (ANTACID ANTI-GAS PO) Take 1 tablet by mouth daily as needed (for gas).     Ascorbic Acid (VITAMIN C) 1000 MG tablet Take 1,000 mg by mouth 2 (two) times daily.     aspirin EC 81 MG tablet Take 81 mg by mouth daily. Swallow whole.     aspirin-acetaminophen-caffeine (EXCEDRIN MIGRAINE) 250-250-65 MG tablet Take 2 tablets by mouth 2 (two) times daily as needed for headache or migraine.     b complex vitamins tablet Take 1 tablet by mouth daily.     bismuth subsalicylate (PEPTO-BISMOL) 262 MG/15ML suspension Take 30 mLs by mouth 4 (four) times daily -  before meals and at bedtime. 360 mL 0   budesonide-formoterol (SYMBICORT) 80-4.5 MCG/ACT inhaler Inhale 2 puffs into the lungs 2 (two) times daily. 1 each 3   Calcium Carbonate-Vitamin D (CALCIUM-D PO) Take 1,200 mg by mouth 2 (two) times daily.     ECHINACEA-GOLDEN SEAL PO Take 1 tablet by mouth 2 (two) times daily as needed (immune system boost).      EPINEPHrine 0.3 mg/0.3 mL IJ SOAJ injection INJECT 0.3ML INTO THE MUSCLE ONCE AS DIRECTED 2 each 0   furosemide (LASIX) 20 MG tablet Take 1 tablet (20 mg total) by mouth daily.  30 tablet 3   hydrochlorothiazide (HYDRODIURIL) 25 MG tablet Take 25 mg by mouth daily.     lisinopril (ZESTRIL) 10 MG tablet Take 1 tablet (10 mg total) by mouth daily. 90 tablet 0   Magnesium 500 MG TABS Take 500 mg by mouth at bedtime.     Melatonin 10 MG TABS Take 1 tablet by mouth at bedtime.     Multiple Vitamin (MULTIVITAMIN WITH MINERALS) TABS tablet Take  1 tablet by mouth daily.     Multiple Vitamins-Minerals (ZINC PO) Take 500 mg by mouth daily as needed (immune system boost).     nystatin ointment (MYCOSTATIN) Apply 1 Application topically 2 (two) times daily. 30 g 2   SUMAtriptan (IMITREX) 25 MG tablet Take 1 tablet (25 mg total) by mouth as needed for migraine (for migraine). May repeat in 2 hours if headache persists or recurs. 9 tablet 0   traZODone (DESYREL) 50 MG tablet Take 0.5-1 tablets (25-50 mg total) by mouth at bedtime as needed for sleep. 30 tablet 3   sertraline (ZOLOFT) 25 MG tablet Take 25 mg by mouth daily.     fluconazole (DIFLUCAN) 150 MG tablet Take 1 tablet (150 mg total) by mouth every 3 (three) days. 2 tablet 0   traMADol (ULTRAM) 50 MG tablet Take 50 mg by mouth.     cyclobenzaprine (FLEXERIL) 10 MG tablet Take 1 tablet (10 mg total) by mouth 3 (three) times daily as needed for muscle spasms. (Patient not taking: Reported on 12/19/2022) 30 tablet 0   ondansetron (ZOFRAN ODT) 4 MG disintegrating tablet Take 1 tablet (4 mg total) by mouth every 8 (eight) hours as needed for nausea or vomiting. (Patient not taking: Reported on 01/03/2023) 20 tablet 0   No facility-administered medications prior to visit.    Allergies  Allergen Reactions   Benzalkonium Chloride Anaphylaxis and Hives   Neosporin [Neomycin-Bacitracin Zn-Polymyx] Anaphylaxis and Hives   Polysporin [Bacitracin-Polymyxin B] Hives   Bee Venom Hives and Swelling    Throat swelling   Bupropion Hcl Other (See Comments)    angry   Cephalexin Hives and Swelling   Latex     ROS Review of  Systems Negative unless indicated in HPI.    Objective:    Physical Exam Constitutional:      Appearance: Normal appearance.  HENT:     Mouth/Throat:     Mouth: Mucous membranes are moist.  Eyes:     Conjunctiva/sclera: Conjunctivae normal.     Pupils: Pupils are equal, round, and reactive to light.  Cardiovascular:     Rate and Rhythm: Normal rate and regular rhythm.     Pulses: Normal pulses.     Heart sounds: Normal heart sounds.  Pulmonary:     Effort: Pulmonary effort is normal.     Breath sounds: Normal breath sounds.  Musculoskeletal:     Cervical back: Normal range of motion. No tenderness.  Skin:    General: Skin is warm.     Findings: No bruising.  Neurological:     General: No focal deficit present.     Mental Status: She is alert and oriented to person, place, and time. Mental status is at baseline.  Psychiatric:        Mood and Affect: Mood normal.        Behavior: Behavior normal.        Thought Content: Thought content normal.        Judgment: Judgment normal.     BP 130/74   Pulse 77   Temp 98.2 F (36.8 C)   Resp 16   Ht 5' 6.5" (1.689 m)   Wt (!) 364 lb 4 oz (165.2 kg)   LMP 07/11/2016   SpO2 98%   BMI 57.91 kg/m  Wt Readings from Last 3 Encounters:  01/10/23 (!) 364 lb 6.4 oz (165.3 kg)  01/09/23 (!) 360 lb (163.3 kg)  01/03/23 (!) 364 lb 4 oz (165.2 kg)  Health Maintenance  Topic Date Due   Hepatitis C Screening  Never done   Zoster Vaccines- Shingrix (1 of 2) Never done   COVID-19 Vaccine (1 - 2023-24 season) Never done   MAMMOGRAM  09/05/2024   PAP SMEAR-Modifier  09/01/2025   DTaP/Tdap/Td (2 - Td or Tdap) 06/25/2027   Colonoscopy  09/08/2032   HIV Screening  Completed   HPV VACCINES  Aged Out   INFLUENZA VACCINE  Discontinued    There are no preventive care reminders to display for this patient.  Lab Results  Component Value Date   TSH 4.25 07/18/2022   Lab Results  Component Value Date   WBC 7.0 12/19/2022   HGB  15.1 (H) 12/19/2022   HCT 45.3 12/19/2022   MCV 93.6 12/19/2022   PLT 260.0 12/19/2022   Lab Results  Component Value Date   NA 137 12/19/2022   K 4.5 12/19/2022   CO2 25 12/19/2022   GLUCOSE 136 (H) 12/19/2022   BUN 13 12/19/2022   CREATININE 0.95 12/19/2022   BILITOT 1.8 (H) 12/19/2022   ALKPHOS 40 12/19/2022   AST 28 12/19/2022   ALT 34 12/19/2022   PROT 7.9 12/19/2022   ALBUMIN 4.6 12/19/2022   CALCIUM 9.7 12/19/2022   ANIONGAP 10 08/02/2017   GFR 68.95 12/19/2022   Lab Results  Component Value Date   CHOL 224 (H) 07/18/2022   Lab Results  Component Value Date   HDL 46.70 07/18/2022   Lab Results  Component Value Date   LDLCALC 159 (H) 07/18/2022   Lab Results  Component Value Date   TRIG 93.0 07/18/2022   Lab Results  Component Value Date   CHOLHDL 5 07/18/2022   Lab Results  Component Value Date   HGBA1C 6.0 07/18/2022      Assessment & Plan:  Anxiety and depression Assessment & Plan: PHQ 9 score= 24  GAD score=21 She will let us know the dosage for Zoloft she was previously on. Started on hydroxyzine for anxiety. Referral sent to psychiatrist.  Orders: -     Ambulatory referral to Psychiatry  Nausea and vomiting, unspecified vomiting type Assessment & Plan: Will treat with Zofran  Orders: -     Ondansetron; Take 1 tablet (4 mg total) by mouth every 8 (eight) hours as needed for nausea or vomiting.  Dispense: 20 tablet; Refill: 0  Insomnia, unspecified type Assessment & Plan: Increased trazodone to 100 mg. Sleep hygiene discussed. Will continue to monitor.   Other orders -     Cyclobenzaprine HCl; Take 1 tablet (10 mg total) by mouth 3 (three) times daily as needed for muscle spasms.  Dispense: 30 tablet; Refill: 0 -     traZODone HCl; Take 1 tablet (100 mg total) by mouth at bedtime.  Dispense: 90 tablet; Refill: 1 -     hydrOXYzine Pamoate; Take 1 capsule (25 mg total) by mouth every 8 (eight) hours as needed.  Dispense: 30 capsule;  Refill: 0    Follow-up: No follow-ups on file.   Kara Dies, NP

## 2023-01-03 NOTE — Assessment & Plan Note (Addendum)
PHQ 9 score= 24  GAD score=21 She will let us know the dosage for Zoloft she was previously on. Started on hydroxyzine for anxiety. Referral sent to psychiatrist.

## 2023-01-07 MED ORDER — SERTRALINE HCL 100 MG PO TABS: 100.0000 mg | ORAL_TABLET | Freq: Every day | ORAL | 2 refills | Status: DC

## 2023-01-08 ENCOUNTER — Ambulatory Visit: Payer: BC Managed Care – PPO | Admitting: Primary Care

## 2023-01-08 NOTE — Progress Notes (Signed)
Danielle Irwin - 52 y.o. female MRN [id number]  Date of birth: [birth year]  Office Visit Note: Visit Date: 12/30/2022 PCP: Kara Dies, NP Referred by: Kara Dies, NP  Subjective: Chief Complaint  Patient presents with   Lower Back - Pain   HPI: Danielle Irwin is a 52 y.o. female who comes in todayHPI    ROS Otherwise per HPI.  Assessment & Plan: Visit Diagnoses:    ICD-10-CM   1. Lumbar radiculopathy  M54.16 XR C-ARM NO REPORT    Epidural Steroid injection    methylPREDNISolone acetate (DEPO-MEDROL) injection 80 mg       Plan: Findings:   at the request of Ellin Goodie, FNP for planned Left S1-2 Lumbar Transforaminal epidural steroid injection with fluoroscopic guidance.  The patient has failed conservative care including home exercise, medications, time and activity modification.  This injection will be diagnostic and hopefully therapeutic.  Please see requesting physician notes for further details and justification.    Meds & Orders:  Meds ordered this encounter  Medications   methylPREDNISolone acetate (DEPO-MEDROL) injection 80 mg    Orders Placed This Encounter  Procedures   XR C-ARM NO REPORT   Epidural Steroid injection    Follow-up: Return for visit to requesting provider as needed.   Procedures: No procedures performed  S1 Lumbosacral Transforaminal Epidural Steroid Injection - Sub-Pedicular Approach with Fluoroscopic Guidance   Patient: Danielle Irwin      Date of Birth: [birth year] MRN: [id number] PCP: Kara Dies, NP      Visit Date: 12/30/2022   Universal Protocol:    Date/Time: 07/31/245:41 AM  Consent Given By: the patient  Position:  PRONE  Additional Comments: Vital signs were monitored before and after the procedure. Patient was prepped and draped in the usual sterile fashion. The correct patient, procedure, and site was verified.   Injection Procedure Details:  Procedure Site One Meds Administered:  Meds ordered  this encounter  Medications   methylPREDNISolone acetate (DEPO-MEDROL) injection 80 mg    Laterality: Left  Location/Site:  S1 Foramen   Needle size: 22 ga.  Needle type: Spinal  Needle Placement: Transforaminal  Findings:   -Comments: Excellent flow of contrast along the nerve, nerve root and into the epidural space.  Epidurogram: Contrast epidurogram showed no nerve root cut off or restricted flow pattern.  Procedure Details: After squaring off the sacral end-plate to get a true AP view, the C-arm was positioned so that the best possible view of the S1 foramen was visualized. The soft tissues overlying this structure were infiltrated with 2-3 ml. of 1% Lidocaine without Epinephrine.    The spinal needle was inserted toward the target using a "trajectory" view along the fluoroscope beam.  Under AP and lateral visualization, the needle was advanced so it did not puncture dura. Biplanar projections were used to confirm position. Aspiration was confirmed to be negative for CSF and/or blood. A 1-2 ml. volume of Isovue-250 was injected and flow of contrast was noted at each level. Radiographs were obtained for documentation purposes.   After attaining the desired flow of contrast documented above, a 0.5 to 1.0 ml test dose of 0.25% Marcaine was injected into each respective transforaminal space.  The patient was observed for 90 seconds post injection.  After no sensory deficits were reported, and normal lower extremity motor function was noted,   the above injectate was administered so that equal amounts of the injectate were placed at each foramen (level) into the transforaminal  epidural space.   Additional Comments:  No complications occurred Dressing: Band-Aid with 2 x 2 sterile gauze    Post-procedure details: Patient was observed during the procedure. Post-procedure instructions were reviewed.  Patient left the clinic in stable condition.   Clinical History: EXAM: MRI  LUMBAR SPINE WITHOUT CONTRAST   TECHNIQUE: Multiplanar, multisequence MR imaging of the lumbar spine was performed. No intravenous contrast was administered.   COMPARISON:  02/21/2018   FINDINGS: Segmentation:  Standard.   Alignment:  Physiologic.   Vertebrae: No acute fracture, evidence of discitis, or aggressive bone lesion.   Conus medullaris and cauda equina: Conus extends to the L1 level. Conus and cauda equina appear normal.   Paraspinal and other soft tissues: No acute paraspinal abnormality.   Disc levels:   Disc spaces: Degenerative disease with disc height loss at L3-4, L4-5 and L5-S1. Disc desiccation at L2-3.   T12-L1: No significant disc bulge. No neural foraminal stenosis. No central canal stenosis.   L1-L2: No significant disc bulge. Mild bilateral facet arthropathy. No foraminal or central canal stenosis.   L2-L3: No significant disc bulge. Mild bilateral facet arthropathy. No foraminal or central canal stenosis.   L3-L4: Mild broad-based disc bulge. Mild bilateral facet arthropathy. Mild spinal stenosis. Right lateral recess stenosis. No foraminal stenosis.   L4-L5: Mild broad-based disc bulge. Mild bilateral facet arthropathy. No spinal stenosis. No foraminal stenosis.   L5-S1: Broad-based disc bulge with a broad left paracentral disc protrusion and contacting the left intraspinal S1 nerve root. Mild bilateral facet arthropathy. Mild left foraminal stenosis. No right foraminal stenosis. No spinal stenosis.   IMPRESSION: 1. At L5-S1 there is a broad-based disc bulge with a broad left paracentral disc protrusion and contacting the left intraspinal S1 nerve root. Mild bilateral facet arthropathy. Mild left foraminal stenosis. 2. At L3-4 there is a mild broad-based disc bulge. Mild bilateral facet arthropathy. Mild spinal stenosis. Right lateral recess stenosis. 3. No acute osseous injury of the lumbar spine.     Electronically Signed   By:  Elige Ko M.D.   On: 10/25/2022 14:17   She reports that she quit smoking about 32 years ago. Her smoking use included cigarettes. She has never used smokeless tobacco.  Recent Labs    07/18/22 0925  HGBA1C 6.0    Objective:  VS:  HT:    WT:   BMI:     BP:(!) 142/86  HR:99bpm  TEMP: ( )  RESP:  Physical Exam Vitals and nursing note reviewed.  Constitutional:      General: She is not in acute distress.    Appearance: Normal appearance. She is not ill-appearing.  HENT:     Head: Normocephalic and atraumatic.     Right Ear: External ear normal.     Left Ear: External ear normal.  Eyes:     Extraocular Movements: Extraocular movements intact.  Cardiovascular:     Rate and Rhythm: Normal rate.     Pulses: Normal pulses.  Pulmonary:     Effort: Pulmonary effort is normal. No respiratory distress.  Abdominal:     General: There is no distension.     Palpations: Abdomen is soft.  Musculoskeletal:        General: Tenderness present.     Cervical back: Neck supple.     Right lower leg: No edema.     Left lower leg: No edema.     Comments: Patient has good distal strength with no pain over the greater trochanters.  No clonus or focal weakness.  Skin:    Findings: No erythema, lesion or rash.  Neurological:     General: No focal deficit present.     Mental Status: She is alert and oriented to person, place, and time.     Sensory: No sensory deficit.     Motor: No weakness or abnormal muscle tone.     Coordination: Coordination normal.  Psychiatric:        Mood and Affect: Mood normal.        Behavior: Behavior normal.     Ortho Exam  Imaging: No results found.  Past Medical/Family/Surgical/Social History: Medications & Allergies reviewed per EMR, new medications updated. Patient Active Problem List   Diagnosis Date Noted   Nausea and vomiting 12/19/2022   Gastroenteritis 09/26/2022   Acute pain of left foot 08/27/2022   Pain in joint, ankle and foot  08/22/2022   Elevated liver function tests 08/14/2022   Hyperlipidemia 08/14/2022   Migraine with aura and without status migrainosus, not intractable 08/14/2022   Vitamin D deficiency 08/08/2022   Low back pain 02/01/2021   Low back strain 02/01/2021   Strain of neck muscle 02/01/2021   Bronchitis 03/25/2019   Sinus infection 10/26/2018   Suspected COVID-19 virus infection 10/08/2018   Influenza-like illness 09/28/2018   Fibromyalgia 05/12/2018   Edema 05/12/2018   BMI 60.0-69.9, adult (HCC) 05/12/2018   DDD (degenerative disc disease), thoracolumbar 01/06/2018   DVT (deep venous thrombosis) (HCC) 08/29/2017   Leukocytosis 08/06/2017   Humerus fracture 08/01/2017   CPAP (continuous positive airway pressure) dependence 08/01/2017   Asthma 08/01/2017   HTN (hypertension) 06/24/2017   Anxiety and depression 11/07/2015   Systolic murmur 11/11/2014   Colon cancer screening 10/26/2014   Morbid obesity (HCC) 10/26/2014   Insomnia 06/21/2014   Anxiety 06/21/2014   Spinal stenosis at L4-L5 level 06/02/2014   POLYCYSTIC OVARIAN DISEASE 06/19/2010   Past Medical History:  Diagnosis Date   Asthma    Depression    Family history of adverse reaction to anesthesia    FATHER WAS SLOW TO WAKE UP   Headache    OCCASIONAL MIGRAINES   History of fibromyalgia    HNP (herniated nucleus pulposus), lumbar    Hx of epistaxis    Hypertension    Insomnia    OA (osteoarthritis)    Obesity    PCOS (polycystic ovarian syndrome)    Sensitive skin    Sleep apnea    Vitamin D deficiency    Family History  Problem Relation Age of Onset   Heart attack Father 28   Breast cancer Maternal Aunt 50   Ovarian cancer Neg Hx    Past Surgical History:  Procedure Laterality Date   BREAST BIOPSY Left 09/17/2022   Stereo bx, Ribbon Clip, path pending   BREAST BIOPSY Left 09/17/2022   MM LT BREAST BX W LOC DEV 1ST LESION IMAGE BX SPEC STEREO GUIDE 09/17/2022 [facility]   COLONOSCOPY WITH PROPOFOL  N/A 09/09/2022   Procedure: COLONOSCOPY WITH PROPOFOL;  Surgeon: Wyline Mood, MD;  Location: [facility] ENDOSCOPY;  Service: Gastroenterology;  Laterality: N/A;   LUMBAR LAMINECTOMY/DECOMPRESSION MICRODISCECTOMY Right 06/02/2014   Procedure: MICRO LUMBAR DECOMPRESSION L4 - L5 ON THE RIGHT 1 LEVEL;  Surgeon: Javier Docker, MD;  Location: [facility] ORS;  Service: Orthopedics;  Laterality: Right;   ORIF HUMERUS FRACTURE Left 08/01/2017   Procedure: OPEN REDUCTION INTERNAL FIXATION (ORIF) PROXIMAL HUMERUS FRACTURE;  Surgeon: Roby Lofts, MD;  Location: [facility] OR;  Service: Orthopedics;  Laterality: Left;   WISDOM TOOTH EXTRACTION     Social History   Occupational History   Not on file  Tobacco Use   Smoking status: Former    Current packs/day: 0.00    Types: Cigarettes    Quit date: 05/26/1990    Years since quitting: 32.6   Smokeless tobacco: Never  Vaping Use   Vaping status: Never Used  Substance and Sexual Activity   Alcohol use: Yes    Comment: occ   Drug use: No   Sexual activity: Not Currently    Partners: Male    Birth control/protection: Post-menopausal

## 2023-01-08 NOTE — Procedures (Signed)
S1 Lumbosacral Transforaminal Epidural Steroid Injection - Sub-Pedicular Approach with Fluoroscopic Guidance   Patient: Danielle Irwin      Date of Birth: [birth year] MRN: [id number] PCP: Kara Dies, NP      Visit Date: 12/30/2022   Universal Protocol:    Date/Time: 07/31/245:41 AM  Consent Given By: the patient  Position:  PRONE  Additional Comments: Vital signs were monitored before and after the procedure. Patient was prepped and draped in the usual sterile fashion. The correct patient, procedure, and site was verified.   Injection Procedure Details:  Procedure Site One Meds Administered:  Meds ordered this encounter  Medications   methylPREDNISolone acetate (DEPO-MEDROL) injection 80 mg    Laterality: Left  Location/Site:  S1 Foramen   Needle size: 22 ga.  Needle type: Spinal  Needle Placement: Transforaminal  Findings:   -Comments: Excellent flow of contrast along the nerve, nerve root and into the epidural space.  Epidurogram: Contrast epidurogram showed no nerve root cut off or restricted flow pattern.  Procedure Details: After squaring off the sacral end-plate to get a true AP view, the C-arm was positioned so that the best possible view of the S1 foramen was visualized. The soft tissues overlying this structure were infiltrated with 2-3 ml. of 1% Lidocaine without Epinephrine.    The spinal needle was inserted toward the target using a "trajectory" view along the fluoroscope beam.  Under AP and lateral visualization, the needle was advanced so it did not puncture dura. Biplanar projections were used to confirm position. Aspiration was confirmed to be negative for CSF and/or blood. A 1-2 ml. volume of Isovue-250 was injected and flow of contrast was noted at each level. Radiographs were obtained for documentation purposes.   After attaining the desired flow of contrast documented above, a 0.5 to 1.0 ml test dose of 0.25% Marcaine was injected into each  respective transforaminal space.  The patient was observed for 90 seconds post injection.  After no sensory deficits were reported, and normal lower extremity motor function was noted,   the above injectate was administered so that equal amounts of the injectate were placed at each foramen (level) into the transforaminal epidural space.   Additional Comments:  No complications occurred Dressing: Band-Aid with 2 x 2 sterile gauze    Post-procedure details: Patient was observed during the procedure. Post-procedure instructions were reviewed.  Patient left the clinic in stable condition.

## 2023-01-08 NOTE — Telephone Encounter (Signed)
Spoke with patient, she will come by tomorrow morning to pick up copy of paperwork.  I will place up front with reception tomorrow when back in office.

## 2023-01-09 ENCOUNTER — Ambulatory Visit: Payer: BC Managed Care – PPO | Admitting: Nurse Practitioner

## 2023-01-09 ENCOUNTER — Ambulatory Visit (INDEPENDENT_AMBULATORY_CARE_PROVIDER_SITE_OTHER): Payer: BC Managed Care – PPO | Admitting: Adult Health

## 2023-01-09 ENCOUNTER — Encounter (INDEPENDENT_AMBULATORY_CARE_PROVIDER_SITE_OTHER): Payer: Self-pay | Admitting: Adult Health

## 2023-01-09 DIAGNOSIS — Z6841 Body Mass Index (BMI) 40.0 and over, adult: Secondary | ICD-10-CM

## 2023-01-09 DIAGNOSIS — E559 Vitamin D deficiency, unspecified: Secondary | ICD-10-CM | POA: Diagnosis not present

## 2023-01-09 NOTE — Progress Notes (Signed)
Office: [phone]  /  Fax: [phone]   Initial Visit  Danielle Irwin was seen in clinic today to evaluate for obesity. She is interested in losing weight to improve overall health and reduce the risk of weight related complications. She presents today to review program treatment options, initial physical assessment, and evaluation.     She was referred by: Friend or Family  When asked what else they would like to accomplish? She states: Adopt healthier eating patterns, Improve energy levels and physical activity, Improve existing medical conditions, Reduce number of medications, Improve quality of life, Improve appearance, Improve self-confidence, and Lose a target amount of weight : She would like to loss >200 lbs  Weight history: She reports gaining weight after 5th and hitting puberty.  When asked how has your weight affected you? She states: Has affected self-esteem, Contributed to medical problems, Contributed to orthopedic problems or mobility issues, Having fatigue, Having poor endurance, Problems with eating patterns, and Has affected mood   Some associated conditions: Hypertension and Arthritis:   Contributing factors: Nutritional, Medications, Stress, Reduced physical activity, Eating patterns, Mental health problems, and Menopause  Weight promoting medications identified: Psychotropic medications and Other: Ultram, Trazadone, Flexeril  Current nutrition plan: None  Current level of physical activity: None  Current or previous pharmacotherapy: Metformin  Response to medication: Ineffective so it was discontinued   Past medical history includes:   Past Medical History:  Diagnosis Date   Asthma    Depression    Family history of adverse reaction to anesthesia    FATHER WAS SLOW TO WAKE UP   Headache    OCCASIONAL MIGRAINES   History of fibromyalgia    HNP (herniated nucleus pulposus), lumbar    Hx of epistaxis    Hypertension    Insomnia    OA  (osteoarthritis)    Obesity    PCOS (polycystic ovarian syndrome)    Sensitive skin    Sleep apnea    Vitamin D deficiency      Objective:   BP 135/65   Pulse 82   Temp 97.6 F (36.4 C)   Ht 5\' 6"  (1.676 m)   Wt (!) 360 lb (163.3 kg)   LMP 07/11/2016   SpO2 96%   BMI 58.11 kg/m  She was weighed on the bioimpedance scale: Body mass index is 58.11 kg/m.  Peak Weight:380 , Body Fat%:57.0, Visceral Fat Rating:24, Weight trend over the last 12 months: Increasing  General:  Alert, oriented and cooperative. Patient is in no acute distress.  Respiratory: Normal respiratory effort, no problems with respiration noted   Gait: able to ambulate independently  Mental Status: Normal mood and affect. Normal behavior. Normal judgment and thought content.   DIAGNOSTIC DATA REVIEWED:  BMET    Component Value Date/Time   NA 137 12/19/2022 1304   K 4.5 12/19/2022 1304   CL 103 12/19/2022 1304   CO2 25 12/19/2022 1304   GLUCOSE 136 (H) 12/19/2022 1304   BUN 13 12/19/2022 1304   CREATININE 0.95 12/19/2022 1304   CALCIUM 9.7 12/19/2022 1304   GFRNONAA >60 08/02/2017 0524   GFRAA >60 08/02/2017 0524   Lab Results  Component Value Date   HGBA1C 6.0 07/18/2022   HGBA1C 5.3 10/26/2014   No results found for: "INSULIN" CBC    Component Value Date/Time   WBC 7.0 12/19/2022 1304   RBC 4.84 12/19/2022 1304   HGB 15.1 (H) 12/19/2022 1304   HCT 45.3 12/19/2022 1304   PLT 260.0  12/19/2022 1304   MCV 93.6 12/19/2022 1304   MCH 31.0 08/03/2017 0342   MCHC 33.3 12/19/2022 1304   RDW 12.6 12/19/2022 1304   Iron/TIBC/Ferritin/ %Sat    Component Value Date/Time   IRON 38 08/02/2017 0524   TIBC 279 08/02/2017 0524   FERRITIN 60 08/02/2017 0524   IRONPCTSAT 14 08/02/2017 0524   Lipid Panel     Component Value Date/Time   CHOL 224 (H) 07/18/2022 0925   TRIG 93.0 07/18/2022 0925   HDL 46.70 07/18/2022 0925   CHOLHDL 5 07/18/2022 0925   VLDL 18.6 07/18/2022 0925   LDLCALC 159 (H)  07/18/2022 0925   LDLDIRECT 129.8 08/21/2010 0911   Hepatic Function Panel     Component Value Date/Time   PROT 7.9 12/19/2022 1304   ALBUMIN 4.6 12/19/2022 1304   AST 28 12/19/2022 1304   ALT 34 12/19/2022 1304   ALKPHOS 40 12/19/2022 1304   BILITOT 1.8 (H) 12/19/2022 1304   BILIDIR 0.2 08/21/2010 0911      Component Value Date/Time   TSH 4.25 07/18/2022 0925     Assessment and Plan:   Morbid obesity (HCC), Starting BMI 58.13  Vitamin D deficiency  ESTABLISH WITH HWW   Obesity Treatment / Action Plan:  Patient will work on garnering support from family and friends to begin weight loss journey. Will work on eliminating or reducing the presence of highly palatable, calorie dense foods in the home. Will complete provided nutritional and psychosocial assessment questionnaire before the next appointment. Will be scheduled for indirect calorimetry to determine resting energy expenditure in a fasting state.  This will allow [place] to create a reduced calorie, high-protein meal plan to promote loss of fat mass while preserving muscle mass. Counseled on the health benefits of losing 5%-15% of total body weight. Was counseled on nutritional approaches to weight loss and benefits of reducing processed foods and consuming plant-based foods and high quality protein as part of nutritional weight management. Was counseled on pharmacotherapy and role as an adjunct in weight management.   Obesity Education Performed Today:  She was weighed on the bioimpedance scale and results were discussed and documented in the synopsis.  We discussed obesity as a disease and the importance of a more detailed evaluation of all the factors contributing to the disease.  We discussed the importance of long term lifestyle changes which include nutrition, exercise and behavioral modifications as well as the importance of customizing this to her specific health and social needs.  We discussed the benefits of  reaching a healthier weight to alleviate the symptoms of existing conditions and reduce the risks of the biomechanical, metabolic and psychological effects of obesity.  Danielle Irwin appears to be in the action stage of change and states they are ready to start intensive lifestyle modifications and behavioral modifications.  30 minutes was spent today on this visit including the above counseling, pre-visit chart review, and post-visit documentation.  Reviewed by clinician on day of visit: allergies, medications, problem list, medical history, surgical history, family history, social history, and previous encounter notes pertinent to obesity diagnosis.   Zaraya Delauder d. Jamylah Marinaccio, NP-C

## 2023-01-10 ENCOUNTER — Encounter: Payer: Self-pay | Admitting: Nurse Practitioner

## 2023-01-10 ENCOUNTER — Ambulatory Visit: Payer: BC Managed Care – PPO | Admitting: Nurse Practitioner

## 2023-01-10 VITALS — BP 112/72 | HR 73 | Temp 97.9°F | Ht 66.0 in | Wt 364.4 lb

## 2023-01-10 DIAGNOSIS — G43909 Migraine, unspecified, not intractable, without status migrainosus: Secondary | ICD-10-CM | POA: Diagnosis not present

## 2023-01-10 MED ORDER — UBRELVY 50 MG PO TABS: ORAL_TABLET | ORAL | Status: DC

## 2023-01-10 NOTE — Progress Notes (Unsigned)
Established Patient Office Visit  Subjective:  Patient ID: Danielle Irwin, female    DOB: February 14, 1971  Age: 52 y.o. MRN: [id number]  CC:  Chief Complaint  Patient presents with   Acute Visit    Migraine x 3 days out of work due to pain and auras    HPI  Danielle Irwin presents fo migrine   Migraine  This is a new problem. The current episode started in the past 7 days (monday). The problem occurs constantly. The quality of the pain is described as throbbing.  She had visual aura yesterday abut no aura today.  She has recently getting one migraine a month fthat lst for 2-6 days.  Past Medical History:  Diagnosis Date   Asthma    Depression    Family history of adverse reaction to anesthesia    FATHER WAS SLOW TO WAKE UP   Headache    OCCASIONAL MIGRAINES   History of fibromyalgia    HNP (herniated nucleus pulposus), lumbar    Hx of epistaxis    Hypertension    Insomnia    OA (osteoarthritis)    Obesity    PCOS (polycystic ovarian syndrome)    Sensitive skin    Sleep apnea    Vitamin D deficiency     Past Surgical History:  Procedure Laterality Date   BREAST BIOPSY Left 09/17/2022   Stereo bx, Ribbon Clip, path pending   BREAST BIOPSY Left 09/17/2022   MM LT BREAST BX W LOC DEV 1ST LESION IMAGE BX SPEC STEREO GUIDE 09/17/2022 [facility]   COLONOSCOPY WITH PROPOFOL N/A 09/09/2022   Procedure: COLONOSCOPY WITH PROPOFOL;  Surgeon: Wyline Mood, MD;  Location: [facility] ENDOSCOPY;  Service: Gastroenterology;  Laterality: N/A;   LUMBAR LAMINECTOMY/DECOMPRESSION MICRODISCECTOMY Right 06/02/2014   Procedure: MICRO LUMBAR DECOMPRESSION L4 - L5 ON THE RIGHT 1 LEVEL;  Surgeon: Javier Docker, MD;  Location: [facility] ORS;  Service: Orthopedics;  Laterality: Right;   ORIF HUMERUS FRACTURE Left 08/01/2017   Procedure: OPEN REDUCTION INTERNAL FIXATION (ORIF) PROXIMAL HUMERUS FRACTURE;  Surgeon: Roby Lofts, MD;  Location: [facility] OR;  Service: Orthopedics;  Laterality: Left;   WISDOM TOOTH  EXTRACTION      Family History  Problem Relation Age of Onset   Heart attack Father 44   Breast cancer Maternal Aunt 50   Ovarian cancer Neg Hx     Social History   Socioeconomic History   Marital status: Single    Spouse name: Not on file   Number of children: Not on file   Years of education: Not on file   Highest education level: Some college, no degree  Occupational History   Not on file  Tobacco Use   Smoking status: Former    Current packs/day: 0.00    Types: Cigarettes    Quit date: 05/26/1990    Years since quitting: 32.6   Smokeless tobacco: Never  Vaping Use   Vaping status: Never Used  Substance and Sexual Activity   Alcohol use: Yes    Comment: occ   Drug use: No   Sexual activity: Not Currently    Partners: Male    Birth control/protection: Post-menopausal  Other Topics Concern   Not on file  Social History Narrative   Not on file   Social Determinants of Health   Financial Resource Strain: High Risk (09/24/2022)   Overall Financial Resource Strain (CARDIA)    Difficulty of Paying Living Expenses: Hard  Food Insecurity: Food Insecurity Present (09/24/2022)  Hunger Vital Sign    Worried About Running Out of Food in the Last Year: Sometimes true    Ran Out of Food in the Last Year: Never true  Transportation Needs: No Transportation Needs (09/24/2022)   PRAPARE - Administrator, Civil Service (Medical): No    Lack of Transportation (Non-Medical): No  Physical Activity: Unknown (09/24/2022)   Exercise Vital Sign    Days of Exercise per Week: 0 days    Minutes of Exercise per Session: Not on file  Stress: Stress Concern Present (09/24/2022)   [employer] of Occupational Health - Occupational Stress Questionnaire    Feeling of Stress : Rather much  Social Connections: Unknown (09/24/2022)   Social Connection and Isolation Panel [NHANES]    Frequency of Communication with Friends and Family: Patient declined    Frequency of Social  Gatherings with Friends and Family: Patient declined    Attends Religious Services: Patient declined    Database administrator or Organizations: No    Attends Engineer, structural: Not on file    Marital Status: Never married  Intimate Partner Violence: Not on file     Outpatient Medications Prior to Visit  Medication Sig Dispense Refill   acetaminophen (TYLENOL) 650 MG CR tablet Take 650 mg by mouth every 8 (eight) hours as needed for pain.     albuterol (VENTOLIN HFA) 108 (90 Base) MCG/ACT inhaler Inhale 2 puffs into the lungs every 6 (six) hours as needed for wheezing or shortness of breath. 8 g 2   Alum & Mag Hydroxide-Simeth (ANTACID ANTI-GAS PO) Take 1 tablet by mouth daily as needed (for gas).     Ascorbic Acid (VITAMIN C) 1000 MG tablet Take 1,000 mg by mouth 2 (two) times daily.     aspirin EC 81 MG tablet Take 81 mg by mouth daily. Swallow whole.     aspirin-acetaminophen-caffeine (EXCEDRIN MIGRAINE) 250-250-65 MG tablet Take 2 tablets by mouth 2 (two) times daily as needed for headache or migraine.     b complex vitamins tablet Take 1 tablet by mouth daily.     bismuth subsalicylate (PEPTO-BISMOL) 262 MG/15ML suspension Take 30 mLs by mouth 4 (four) times daily -  before meals and at bedtime. 360 mL 0   budesonide-formoterol (SYMBICORT) 80-4.5 MCG/ACT inhaler Inhale 2 puffs into the lungs 2 (two) times daily. 1 each 3   Calcium Carbonate-Vitamin D (CALCIUM-D PO) Take 1,200 mg by mouth 2 (two) times daily.     cyclobenzaprine (FLEXERIL) 10 MG tablet Take 1 tablet (10 mg total) by mouth 3 (three) times daily as needed for muscle spasms. 30 tablet 0   ECHINACEA-GOLDEN SEAL PO Take 1 tablet by mouth 2 (two) times daily as needed (immune system boost).      EPINEPHrine 0.3 mg/0.3 mL IJ SOAJ injection INJECT 0.3ML INTO THE MUSCLE ONCE AS DIRECTED 2 each 0   fluconazole (DIFLUCAN) 150 MG tablet Take 1 tablet (150 mg total) by mouth every 3 (three) days. 2 tablet 0   furosemide  (LASIX) 20 MG tablet Take 1 tablet (20 mg total) by mouth daily. 30 tablet 3   hydrochlorothiazide (HYDRODIURIL) 25 MG tablet Take 25 mg by mouth daily.     hydrOXYzine (VISTARIL) 25 MG capsule Take 1 capsule (25 mg total) by mouth every 8 (eight) hours as needed. 30 capsule 0   lisinopril (ZESTRIL) 10 MG tablet Take 1 tablet (10 mg total) by mouth daily. 90 tablet 0   Magnesium  500 MG TABS Take 500 mg by mouth at bedtime.     Melatonin 10 MG TABS Take 1 tablet by mouth at bedtime.     Multiple Vitamin (MULTIVITAMIN WITH MINERALS) TABS tablet Take 1 tablet by mouth daily.     Multiple Vitamins-Minerals (ZINC PO) Take 500 mg by mouth daily as needed (immune system boost).     nystatin ointment (MYCOSTATIN) Apply 1 Application topically 2 (two) times daily. 30 g 2   ondansetron (ZOFRAN ODT) 4 MG disintegrating tablet Take 1 tablet (4 mg total) by mouth every 8 (eight) hours as needed for nausea or vomiting. 20 tablet 0   sertraline (ZOLOFT) 100 MG tablet Take 1 tablet (100 mg total) by mouth daily. 90 tablet 2   SUMAtriptan (IMITREX) 25 MG tablet Take 1 tablet (25 mg total) by mouth as needed for migraine (for migraine). May repeat in 2 hours if headache persists or recurs. 9 tablet 0   traMADol (ULTRAM) 50 MG tablet Take 50 mg by mouth.     traZODone (DESYREL) 100 MG tablet Take 1 tablet (100 mg total) by mouth at bedtime. 90 tablet 1   traZODone (DESYREL) 50 MG tablet Take 0.5-1 tablets (25-50 mg total) by mouth at bedtime as needed for sleep. 30 tablet 3   No facility-administered medications prior to visit.    Allergies  Allergen Reactions   Benzalkonium Chloride Anaphylaxis and Hives   Neosporin [Neomycin-Bacitracin Zn-Polymyx] Anaphylaxis and Hives   Polysporin [Bacitracin-Polymyxin B] Hives   Bee Venom Hives and Swelling    Throat swelling   Bupropion Hcl Other (See Comments)    angry   Cephalexin Hives and Swelling   Latex     ROS Review of Systems Negative unless indicated in  HPI.    Objective:    Physical Exam  BP 112/72   Pulse 73   Temp 97.9 F (36.6 C)   Ht 5\' 6"  (1.676 m)   Wt (!) 364 lb 6.4 oz (165.3 kg)   LMP 07/11/2016   SpO2 97%   BMI 58.82 kg/m  Wt Readings from Last 3 Encounters:  01/10/23 (!) 364 lb 6.4 oz (165.3 kg)  01/09/23 (!) 360 lb (163.3 kg)  01/03/23 (!) 364 lb 4 oz (165.2 kg)     Health Maintenance  Topic Date Due   Hepatitis C Screening  Never done   Zoster Vaccines- Shingrix (1 of 2) Never done   COVID-19 Vaccine (1 - 2023-24 season) Never done   MAMMOGRAM  09/05/2024   PAP SMEAR-Modifier  09/01/2025   DTaP/Tdap/Td (2 - Td or Tdap) 06/25/2027   Colonoscopy  09/08/2032   HIV Screening  Completed   HPV VACCINES  Aged Out   INFLUENZA VACCINE  Discontinued    There are no preventive care reminders to display for this patient.  Lab Results  Component Value Date   TSH 4.25 07/18/2022   Lab Results  Component Value Date   WBC 7.0 12/19/2022   HGB 15.1 (H) 12/19/2022   HCT 45.3 12/19/2022   MCV 93.6 12/19/2022   PLT 260.0 12/19/2022   Lab Results  Component Value Date   NA 137 12/19/2022   K 4.5 12/19/2022   CO2 25 12/19/2022   GLUCOSE 136 (H) 12/19/2022   BUN 13 12/19/2022   CREATININE 0.95 12/19/2022   BILITOT 1.8 (H) 12/19/2022   ALKPHOS 40 12/19/2022   AST 28 12/19/2022   ALT 34 12/19/2022   PROT 7.9 12/19/2022   ALBUMIN 4.6 12/19/2022  CALCIUM 9.7 12/19/2022   ANIONGAP 10 08/02/2017   GFR 68.95 12/19/2022   Lab Results  Component Value Date   CHOL 224 (H) 07/18/2022   Lab Results  Component Value Date   HDL 46.70 07/18/2022   Lab Results  Component Value Date   LDLCALC 159 (H) 07/18/2022   Lab Results  Component Value Date   TRIG 93.0 07/18/2022   Lab Results  Component Value Date   CHOLHDL 5 07/18/2022   Lab Results  Component Value Date   HGBA1C 6.0 07/18/2022      Assessment & Plan:  There are no diagnoses linked to this encounter.  Follow-up: No follow-ups on file.    Kara Dies, NP

## 2023-01-10 NOTE — Patient Instructions (Addendum)
Referral sent to neurology Ubrelely sample provided to patient. Take 50 mg  if needed can repeat after 2 hours no more than 200 mg in a day.   Ubrogepant Tablets What is this medication? UBROGEPANT (ue BROE je pant) treats migraines. It works by blocking a substance in the body that causes migraines. It is not used to prevent migraines. This medicine may be used for other purposes; ask your health care provider or pharmacist if you have questions. COMMON BRAND NAME(S): Bernita Raisin What should I tell my care team before I take this medication? They need to know if you have any of these conditions: Kidney disease Liver disease An unusual or allergic reaction to ubrogepant, other medications, foods, dyes, or preservatives Pregnant or trying to get pregnant Breast-feeding How should I use this medication? Take this medication by mouth with a glass of water. Take it as directed on the prescription label. You can take it with or without food. If it upsets your stomach, take it with food. Keep taking it unless your care team tells you to stop. Talk to your care team about the use of this medication in children. Special care may be needed. Overdosage: If you think you have taken too much of this medicine contact a poison control center or emergency room at once. NOTE: This medicine is only for you. Do not share this medicine with others. What if I miss a dose? This does not apply. This medication is not for regular use. What may interact with this medication? Do not take this medication with any of the following: Adagrasib Ceritinib Certain antibiotics, such as chloramphenicol, clarithromycin, telithromycin Certain antivirals for HIV, such as atazanavir, cobicistat, darunavir, delavirdine, fosamprenavir, indinavir, ritonavir Certain medications for fungal infections, such as itraconazole, ketoconazole, posaconazole,  voriconazole Conivaptan Grapefruit Idelalisib Mifepristone Nefazodone Ribociclib This medication may also interact with the following: Carvedilol Certain medications for seizures, such as phenobarbital, phenytoin Ciprofloxacin Cyclosporine Eltrombopag Fluconazole Fluvoxamine Quinidine Rifampin St. John's wort Verapamil This list may not describe all possible interactions. Give your health care provider a list of all the medicines, herbs, non-prescription drugs, or dietary supplements you use. Also tell them if you smoke, drink alcohol, or use illegal drugs. Some items may interact with your medicine. What should I watch for while using this medication? Visit your care team for regular checks on your progress. Tell your care team if your symptoms do not start to get better or if they get worse. Your mouth may get dry. Chewing sugarless gum or sucking hard candy and drinking plenty of water may help. Contact your care team if the problem does not go away or is severe. What side effects may I notice from receiving this medication? Side effects that you should report to your care team as soon as possible: Allergic reactions--skin rash, itching, hives, swelling of the face, lips, tongue, or throat Side effects that usually do not require medical attention (report to your care team if they continue or are bothersome): Drowsiness Dry mouth Fatigue Nausea This list may not describe all possible side effects. Call your doctor for medical advice about side effects. You may report side effects to FDA at 1-800-FDA-1088. Where should I keep my medication? Keep out of the reach of children and pets. Store between 15 and 30 degrees C (59 and 86 degrees F). Get rid of any unused medication after the expiration date. To get rid of medications that are no longer needed or have expired: Take the medication to a medication  take-back program. Check with your pharmacy or law enforcement to find a  location. If you cannot return the medication, check the label or package insert to see if the medication should be thrown out in the garbage or flushed down the toilet. If you are not sure, ask your care team. If it is safe to put it in the trash, pour the medication out of the container. Mix the medication with cat litter, dirt, coffee grounds, or other unwanted substance. Seal the mixture in a bag or container. Put it in the trash. NOTE: This sheet is a summary. It may not cover all possible information. If you have questions about this medicine, talk to your doctor, pharmacist, or health care provider.  2024 Elsevier/Gold Standard (2021-07-20 00:00:00) the patient.Marland Kitchen

## 2023-01-12 ENCOUNTER — Encounter: Payer: Self-pay | Admitting: Nurse Practitioner

## 2023-01-12 NOTE — Assessment & Plan Note (Addendum)
Will treat with Ubrelvy. Sample of the medication provided.  Side effect of the medication discussed. Advised patient to take 1 medication now and can repeat in 12 hours if headache is not better no more than 200 mg/day. Advised to increase fluid intake. Completed paperwork for her work. Referral sent to neurology.

## 2023-01-13 ENCOUNTER — Encounter: Payer: Self-pay | Admitting: Neurology

## 2023-01-19 NOTE — Assessment & Plan Note (Signed)
Will treat with Zofran

## 2023-01-19 NOTE — Assessment & Plan Note (Signed)
Increased trazodone to 100 mg. Sleep hygiene discussed. Will continue to monitor.

## 2023-01-30 ENCOUNTER — Encounter: Payer: Self-pay | Admitting: Nurse Practitioner

## 2023-01-30 ENCOUNTER — Ambulatory Visit: Payer: BC Managed Care – PPO | Admitting: Nurse Practitioner

## 2023-01-30 VITALS — BP 102/70 | HR 56 | Temp 97.9°F | Ht 66.0 in | Wt 366.0 lb

## 2023-01-30 DIAGNOSIS — R197 Diarrhea, unspecified: Secondary | ICD-10-CM

## 2023-01-30 DIAGNOSIS — W57XXXA Bitten or stung by nonvenomous insect and other nonvenomous arthropods, initial encounter: Secondary | ICD-10-CM | POA: Diagnosis not present

## 2023-01-30 DIAGNOSIS — S80862A Insect bite (nonvenomous), left lower leg, initial encounter: Secondary | ICD-10-CM

## 2023-01-30 MED ORDER — DOXYCYCLINE HYCLATE 100 MG PO TABS: 100.0000 mg | ORAL_TABLET | Freq: Two times a day (BID) | ORAL | 0 refills | Status: AC

## 2023-01-30 MED ORDER — ONDANSETRON HCL 4 MG PO TABS: 4.0000 mg | ORAL_TABLET | Freq: Three times a day (TID) | ORAL | 0 refills | Status: DC | PRN

## 2023-01-30 MED ORDER — UBRELVY 50 MG PO TABS: ORAL_TABLET | ORAL | 0 refills | Status: DC

## 2023-01-30 MED ORDER — HYDROXYZINE PAMOATE 25 MG PO CAPS: 25.0000 mg | ORAL_CAPSULE | Freq: Three times a day (TID) | ORAL | 2 refills | Status: DC | PRN

## 2023-01-30 NOTE — Progress Notes (Signed)
Established Patient Office Visit  Subjective:  Patient ID: Danielle Irwin, female    DOB: [birth year]  Age: 52 y.o. MRN: [id number]  CC:  Chief Complaint  Patient presents with   Nausea    Diarrhea, throwing up, body aches     HPI  Danielle Irwin presents for nausea, diarrhea, headache and bodyaches that started on Tuesday. She states that on Monday she found the tick attached to her left shin.  She is not sure how long the tick was attached to the body.  Denies any rash, pruritus or erythema.  She has 3 diarrhea today, she is able to keep the liquid in. Have not had any solid food today.   She is having frequent diarrhea, nausea and intermittent cramping.   HPI   Past Medical History:  Diagnosis Date   Asthma    Depression    Family history of adverse reaction to anesthesia    FATHER WAS SLOW TO WAKE UP   Headache    OCCASIONAL MIGRAINES   History of fibromyalgia    HNP (herniated nucleus pulposus), lumbar    Hx of epistaxis    Hypertension    Insomnia    OA (osteoarthritis)    Obesity    PCOS (polycystic ovarian syndrome)    Sensitive skin    Sleep apnea    Vitamin D deficiency     Past Surgical History:  Procedure Laterality Date   BREAST BIOPSY Left 09/17/2022   Stereo bx, Ribbon Clip, path pending   BREAST BIOPSY Left 09/17/2022   MM LT BREAST BX W LOC DEV 1ST LESION IMAGE BX SPEC STEREO GUIDE 09/17/2022 [facility]   COLONOSCOPY WITH PROPOFOL N/A 09/09/2022   Procedure: COLONOSCOPY WITH PROPOFOL;  Surgeon: Wyline Mood, MD;  Location: [facility] ENDOSCOPY;  Service: Gastroenterology;  Laterality: N/A;   LUMBAR LAMINECTOMY/DECOMPRESSION MICRODISCECTOMY Right 06/02/2014   Procedure: MICRO LUMBAR DECOMPRESSION L4 - L5 ON THE RIGHT 1 LEVEL;  Surgeon: Javier Docker, MD;  Location: [facility] ORS;  Service: Orthopedics;  Laterality: Right;   ORIF HUMERUS FRACTURE Left 08/01/2017   Procedure: OPEN REDUCTION INTERNAL FIXATION (ORIF) PROXIMAL HUMERUS FRACTURE;  Surgeon: Roby Lofts, MD;  Location: [facility] OR;  Service: Orthopedics;  Laterality: Left;   WISDOM TOOTH EXTRACTION      Family History  Problem Relation Age of Onset   Heart attack Father 23   Breast cancer Maternal Aunt 50   Ovarian cancer Neg Hx     Social History   Socioeconomic History   Marital status: Single    Spouse name: Not on file   Number of children: Not on file   Years of education: Not on file   Highest education level: Some college, no degree  Occupational History   Not on file  Tobacco Use   Smoking status: Former    Current packs/day: 0.00    Types: Cigarettes    Quit date: 05/26/1990    Years since quitting: 32.7   Smokeless tobacco: Never  Vaping Use   Vaping status: Never Used  Substance and Sexual Activity   Alcohol use: Yes    Comment: occ   Drug use: No   Sexual activity: Not Currently    Partners: Male    Birth control/protection: Post-menopausal  Other Topics Concern   Not on file  Social History Narrative   Not on file   Social Determinants of Health   Financial Resource Strain: High Risk (09/24/2022)   Overall Financial Resource Strain (CARDIA)  Difficulty of Paying Living Expenses: Hard  Food Insecurity: Food Insecurity Present (09/24/2022)   Hunger Vital Sign    Worried About Running Out of Food in the Last Year: Sometimes true    Ran Out of Food in the Last Year: Never true  Transportation Needs: No Transportation Needs (09/24/2022)   PRAPARE - Administrator, Civil Service (Medical): No    Lack of Transportation (Non-Medical): No  Physical Activity: Unknown (09/24/2022)   Exercise Vital Sign    Days of Exercise per Week: 0 days    Minutes of Exercise per Session: Not on file  Stress: Stress Concern Present (09/24/2022)   [employer] of Occupational Health - Occupational Stress Questionnaire    Feeling of Stress : Rather much  Social Connections: Unknown (09/24/2022)   Social Connection and Isolation Panel [NHANES]     Frequency of Communication with Friends and Family: Patient declined    Frequency of Social Gatherings with Friends and Family: Patient declined    Attends Religious Services: Patient declined    Database administrator or Organizations: No    Attends Engineer, structural: Not on file    Marital Status: Never married  Intimate Partner Violence: Not on file     Outpatient Medications Prior to Visit  Medication Sig Dispense Refill   acetaminophen (TYLENOL) 650 MG CR tablet Take 650 mg by mouth every 8 (eight) hours as needed for pain.     albuterol (VENTOLIN HFA) 108 (90 Base) MCG/ACT inhaler Inhale 2 puffs into the lungs every 6 (six) hours as needed for wheezing or shortness of breath. 8 g 2   Alum & Mag Hydroxide-Simeth (ANTACID ANTI-GAS PO) Take 1 tablet by mouth daily as needed (for gas).     Ascorbic Acid (VITAMIN C) 1000 MG tablet Take 1,000 mg by mouth 2 (two) times daily.     aspirin EC 81 MG tablet Take 81 mg by mouth daily. Swallow whole.     aspirin-acetaminophen-caffeine (EXCEDRIN MIGRAINE) 250-250-65 MG tablet Take 2 tablets by mouth 2 (two) times daily as needed for headache or migraine.     b complex vitamins tablet Take 1 tablet by mouth daily.     bismuth subsalicylate (PEPTO-BISMOL) 262 MG/15ML suspension Take 30 mLs by mouth 4 (four) times daily -  before meals and at bedtime. 360 mL 0   budesonide-formoterol (SYMBICORT) 80-4.5 MCG/ACT inhaler Inhale 2 puffs into the lungs 2 (two) times daily. 1 each 3   Calcium Carbonate-Vitamin D (CALCIUM-D PO) Take 1,200 mg by mouth 2 (two) times daily.     cyclobenzaprine (FLEXERIL) 10 MG tablet Take 1 tablet (10 mg total) by mouth 3 (three) times daily as needed for muscle spasms. 30 tablet 0   ECHINACEA-GOLDEN SEAL PO Take 1 tablet by mouth 2 (two) times daily as needed (immune system boost).      EPINEPHrine 0.3 mg/0.3 mL IJ SOAJ injection INJECT 0.3ML INTO THE MUSCLE ONCE AS DIRECTED 2 each 0   fluconazole (DIFLUCAN) 150 MG  tablet Take 1 tablet (150 mg total) by mouth every 3 (three) days. 2 tablet 0   furosemide (LASIX) 20 MG tablet Take 1 tablet (20 mg total) by mouth daily. 30 tablet 3   hydrochlorothiazide (HYDRODIURIL) 25 MG tablet Take 25 mg by mouth daily.     lisinopril (ZESTRIL) 10 MG tablet Take 1 tablet (10 mg total) by mouth daily. 90 tablet 0   Magnesium 500 MG TABS Take 500 mg by mouth at  bedtime.     Melatonin 10 MG TABS Take 1 tablet by mouth at bedtime.     Multiple Vitamin (MULTIVITAMIN WITH MINERALS) TABS tablet Take 1 tablet by mouth daily.     Multiple Vitamins-Minerals (ZINC PO) Take 500 mg by mouth daily as needed (immune system boost).     nystatin ointment (MYCOSTATIN) Apply 1 Application topically 2 (two) times daily. 30 g 2   sertraline (ZOLOFT) 100 MG tablet Take 1 tablet (100 mg total) by mouth daily. 90 tablet 2   SUMAtriptan (IMITREX) 25 MG tablet Take 1 tablet (25 mg total) by mouth as needed for migraine (for migraine). May repeat in 2 hours if headache persists or recurs. 9 tablet 0   traMADol (ULTRAM) 50 MG tablet Take 50 mg by mouth.     traZODone (DESYREL) 100 MG tablet Take 1 tablet (100 mg total) by mouth at bedtime. 90 tablet 1   traZODone (DESYREL) 50 MG tablet Take 0.5-1 tablets (25-50 mg total) by mouth at bedtime as needed for sleep. 30 tablet 3   hydrOXYzine (VISTARIL) 25 MG capsule Take 1 capsule (25 mg total) by mouth every 8 (eight) hours as needed. 30 capsule 0   ondansetron (ZOFRAN ODT) 4 MG disintegrating tablet Take 1 tablet (4 mg total) by mouth every 8 (eight) hours as needed for nausea or vomiting. 20 tablet 0   Ubrogepant (UBRELVY) 50 MG TABS Take 50 mg on the onset of headache .Can repeat after 2 hours if needed. Do not exceed 200mg  in 24 hours.     No facility-administered medications prior to visit.    Allergies  Allergen Reactions   Benzalkonium Chloride Anaphylaxis and Hives   Neosporin [Neomycin-Bacitracin Zn-Polymyx] Anaphylaxis and Hives    Polysporin [Bacitracin-Polymyxin B] Hives   Bee Venom Hives and Swelling    Throat swelling   Bupropion Hcl Other (See Comments)    angry   Cephalexin Hives and Swelling   Latex     ROS Review of Systems Negative unless indicated in HPI.    Objective:    Physical Exam Constitutional:      Appearance: Normal appearance.  Cardiovascular:     Rate and Rhythm: Normal rate and regular rhythm.     Pulses: Normal pulses.     Heart sounds: Normal heart sounds.  Pulmonary:     Effort: Pulmonary effort is normal.     Breath sounds: Normal breath sounds.  Abdominal:     General: Bowel sounds are normal.     Palpations: Abdomen is soft.     Tenderness: There is no abdominal tenderness. There is no right CVA tenderness or left CVA tenderness.  Musculoskeletal:     Cervical back: Normal range of motion.     Comments: No erythema or pruritus to the area of tick attachment on the lower extremity  Neurological:     General: No focal deficit present.     Mental Status: She is alert. Mental status is at baseline.  Psychiatric:        Mood and Affect: Mood normal.        Behavior: Behavior normal.        Thought Content: Thought content normal.        Judgment: Judgment normal.     BP 102/70   Pulse (!) 56   Temp 97.9 F (36.6 C) (Oral)   Ht 5\' 6"  (1.676 m)   Wt (!) 366 lb (166 kg)   LMP 07/11/2016   SpO2 96%  BMI 59.07 kg/m  Wt Readings from Last 3 Encounters:  01/30/23 (!) 366 lb (166 kg)  01/10/23 (!) 364 lb 6.4 oz (165.3 kg)  01/09/23 (!) 360 lb (163.3 kg)     Health Maintenance  Topic Date Due   Hepatitis C Screening  Never done   Zoster Vaccines- Shingrix (1 of 2) Never done   COVID-19 Vaccine (1 - 2023-24 season) Never done   MAMMOGRAM  09/05/2024   PAP SMEAR-Modifier  09/01/2025   DTaP/Tdap/Td (2 - Td or Tdap) 06/25/2027   Colonoscopy  09/08/2032   HIV Screening  Completed   HPV VACCINES  Aged Out   INFLUENZA VACCINE  Discontinued    There are no  preventive care reminders to display for this patient.  Lab Results  Component Value Date   TSH 4.25 07/18/2022   Lab Results  Component Value Date   WBC 7.0 12/19/2022   HGB 15.1 (H) 12/19/2022   HCT 45.3 12/19/2022   MCV 93.6 12/19/2022   PLT 260.0 12/19/2022   Lab Results  Component Value Date   NA 137 12/19/2022   K 4.5 12/19/2022   CO2 25 12/19/2022   GLUCOSE 136 (H) 12/19/2022   BUN 13 12/19/2022   CREATININE 0.95 12/19/2022   BILITOT 1.8 (H) 12/19/2022   ALKPHOS 40 12/19/2022   AST 28 12/19/2022   ALT 34 12/19/2022   PROT 7.9 12/19/2022   ALBUMIN 4.6 12/19/2022   CALCIUM 9.7 12/19/2022   ANIONGAP 10 08/02/2017   GFR 68.95 12/19/2022   Lab Results  Component Value Date   CHOL 224 (H) 07/18/2022   Lab Results  Component Value Date   HDL 46.70 07/18/2022   Lab Results  Component Value Date   LDLCALC 159 (H) 07/18/2022   Lab Results  Component Value Date   TRIG 93.0 07/18/2022   Lab Results  Component Value Date   CHOLHDL 5 07/18/2022   Lab Results  Component Value Date   HGBA1C 6.0 07/18/2022      Assessment & Plan:  Diarrhea, unspecified type Assessment & Plan: Benign abdominal exam. Advised patient to consume BRAT diet and adequate hydration. Will refer to GI for intermittent diarrhea.   Orders: -     Ambulatory referral to Gastroenterology  Tick bite of left lower leg, initial encounter Assessment & Plan: No erythema, pruritus or rash to the area. Patient have headache and muscle pain.  Unclear etiology. Will treat with doxycycline   Other orders -     Doxycycline Hyclate; Take 1 tablet (100 mg total) by mouth 2 (two) times daily for 7 days.  Dispense: 14 tablet; Refill: 0 -     hydrOXYzine Pamoate; Take 1 capsule (25 mg total) by mouth every 8 (eight) hours as needed.  Dispense: 90 capsule; Refill: 2 -     Ondansetron HCl; Take 1 tablet (4 mg total) by mouth every 8 (eight) hours as needed for nausea or vomiting.  Dispense: 30  tablet; Refill: 0 -     Ubrelvy; Take 50-100mg  at headache onset. Can repeat after 2 hours if needed. Do not exceed 200mg  in 24 hours.  Dispense: 30 tablet; Refill: 0    Follow-up: No follow-ups on file.   Kara Dies, NP

## 2023-02-09 DIAGNOSIS — S80862A Insect bite (nonvenomous), left lower leg, initial encounter: Secondary | ICD-10-CM | POA: Insufficient documentation

## 2023-02-09 DIAGNOSIS — R197 Diarrhea, unspecified: Secondary | ICD-10-CM | POA: Insufficient documentation

## 2023-02-09 DIAGNOSIS — W57XXXA Bitten or stung by nonvenomous insect and other nonvenomous arthropods, initial encounter: Secondary | ICD-10-CM | POA: Insufficient documentation

## 2023-02-09 NOTE — Assessment & Plan Note (Addendum)
Benign abdominal exam. Advised patient to consume BRAT diet and adequate hydration. Will refer to GI for intermittent diarrhea.

## 2023-02-09 NOTE — Assessment & Plan Note (Addendum)
No erythema, pruritus or rash to the area. Patient have headache and muscle pain.  Unclear etiology. Will treat with doxycycline

## 2023-02-11 ENCOUNTER — Ambulatory Visit (INDEPENDENT_AMBULATORY_CARE_PROVIDER_SITE_OTHER): Payer: BC Managed Care – PPO | Admitting: Internal Medicine

## 2023-02-11 ENCOUNTER — Encounter (INDEPENDENT_AMBULATORY_CARE_PROVIDER_SITE_OTHER): Payer: Self-pay | Admitting: Internal Medicine

## 2023-02-11 VITALS — BP 128/76 | HR 70 | Temp 98.7°F | Ht 67.0 in | Wt 364.0 lb

## 2023-02-11 DIAGNOSIS — G4733 Obstructive sleep apnea (adult) (pediatric): Secondary | ICD-10-CM

## 2023-02-11 DIAGNOSIS — Z6841 Body Mass Index (BMI) 40.0 and over, adult: Secondary | ICD-10-CM

## 2023-02-11 DIAGNOSIS — R5383 Other fatigue: Secondary | ICD-10-CM

## 2023-02-11 DIAGNOSIS — E282 Polycystic ovarian syndrome: Secondary | ICD-10-CM

## 2023-02-11 DIAGNOSIS — R7303 Prediabetes: Secondary | ICD-10-CM | POA: Diagnosis not present

## 2023-02-11 DIAGNOSIS — E559 Vitamin D deficiency, unspecified: Secondary | ICD-10-CM | POA: Diagnosis not present

## 2023-02-11 DIAGNOSIS — E7800 Pure hypercholesterolemia, unspecified: Secondary | ICD-10-CM

## 2023-02-11 DIAGNOSIS — Z723 Lack of physical exercise: Secondary | ICD-10-CM

## 2023-02-11 DIAGNOSIS — Z1331 Encounter for screening for depression: Secondary | ICD-10-CM | POA: Diagnosis not present

## 2023-02-11 DIAGNOSIS — I10 Essential (primary) hypertension: Secondary | ICD-10-CM

## 2023-02-11 DIAGNOSIS — R0602 Shortness of breath: Secondary | ICD-10-CM

## 2023-02-11 NOTE — Assessment & Plan Note (Signed)
Most recent A1c is  Lab Results  Component Value Date   HGBA1C 6.0 07/18/2022   HGBA1C 5.3 10/26/2014   Patient was not aware of condition.  Patient aware of disease state and risk of progression. This may contribute to abnormal cravings, fatigue and diabetic complications without having diabetes.   We have discussed treatment options which include: losing 7 to 10% of body weight, increasing physical activity to a goal of 150 minutes a week at moderate intensity.  Advised to maintain a diet low on simple and processed carbohydrates.  She may also be a candidate for pharmacoprophylaxis with metformin or incretin mimetic.

## 2023-02-11 NOTE — Progress Notes (Signed)
Chief Complaint:   OBESITY Danielle Irwin (MR# [id number]) is a 52 y.o. female who presents for evaluation and treatment of obesity and related comorbidities. Current BMI is Body mass index is 57.01 kg/m. Danielle Irwin has been struggling with her weight for many years and has been unsuccessful in either losing weight, maintaining weight loss, or reaching her healthy weight goal.  Danielle Irwin is currently in the action stage of change and ready to dedicate time achieving and maintaining a healthier weight. Danielle Irwin is interested in becoming our patient and working on intensive lifestyle modifications including (but not limited to) diet and exercise for weight loss.  Danielle Irwin's habits were reviewed today and are as follows: Her family eats meals together, she thinks her family will eat healthier with her, she struggles with family and or coworkers weight loss sabotage, her desired weight loss is 184 lbs, she has been heavy most of her life, she started gaining weight when she hit puberty, her heaviest weight ever was 380 pounds, she has significant food cravings issues, she snacks frequently in the evenings, she wakes up frequently in the middle of the night to eat, she skips meals frequently, she is frequently drinking liquids with calories, she frequently makes poor food choices, she frequently eats larger portions than normal, and she struggles with emotional eating.  Depression Screen Danielle Irwin's Food and Mood (modified PHQ-9) score was 19.  Subjective:   1. Other fatigue Danielle Irwin admits to daytime somnolence and admits to waking up still tired. Patient has a history of symptoms of daytime fatigue, morning fatigue, and morning headache. Danielle Irwin generally gets 4 or 6 hours of sleep per night, and states that she has nightime awakenings. Snoring is present. Apneic episodes are present. Epworth Sleepiness Score is 12.   2. SOB (shortness of breath) on exertion Danielle Irwin notes increasing shortness of breath with exercising and  seems to be worsening over time with weight gain. She notes getting out of breath sooner with activity than she used to. This has not gotten worse recently. Danielle Irwin denies shortness of breath at rest or orthopnea.  3. Prediabetes Patient was not aware of condition.  Most recent A1c is  Lab Results  Component Value Date   HGBA1C 6.0 07/18/2022   HGBA1C 5.3 10/26/2014   Patient aware of disease state and risk of progression. This may contribute to abnormal cravings, fatigue and diabetic complications without having diabetes.   4. Vitamin D deficiency Deficiency state associated with adiposity and may result in leptin resistance, weight gain and fatigue. Currently on vitamin D supplementation without any adverse effects.  Most recent vitamin D levels  Lab Results  Component Value Date   VD25OH 17.54 (L) 07/18/2022   VD25OH 26.79 (L) 05/12/2018   VD25OH 39.10 10/30/2015    5. Primary hypertension Blood pressure at goal for age and risk category.  On lisinopril 10 mg once a day without adverse effects.  Most recent renal parameters reviewed which showed normal electrolytes and kidney function.  6. Pure hypercholesterolemia LDL is not at goal. Elevated LDL may be secondary to nutrition, genetics and spillover effect from excess adiposity. Recommended LDL goal is <70 to reduce the risk of fatty streaks and the progression to obstructive ASCVD in the future.  She has family history of premature coronary artery disease her father had heart attacks in his 7s.  She is not on statin therapy.  Her 10 year risk is: The 10-year ASCVD risk score (Arnett DK, et al., 2019) is:  2.8%  Lab Results  Component Value Date   CHOL 224 (H) 07/18/2022   HDL 46.70 07/18/2022   LDLCALC 159 (H) 07/18/2022   LDLDIRECT 129.8 08/21/2010   TRIG 93.0 07/18/2022   CHOLHDL 5 07/18/2022   7. Polycystic ovarian syndrome This is also a complex condition.  I suspect she may have elevated androgen levels and  hyperinsulinemia.  8. OSA (obstructive sleep apnea) On CPAP with reported good compliance.   9. Physically inactive Secondary to chronic fatigue and orthopedic problems and chronic pain.  Assessment/Plan:   1. Other fatigue Danielle Irwin does feel that her weight is causing her energy to be lower than it should be. Fatigue may be related to obesity, depression or many other causes. Labs will be ordered, and in the meanwhile, Danielle Irwin will focus on self care including making healthy food choices, increasing physical activity and focusing on stress reduction.  - EKG 12-Lead - Vitamin B12  2. SOB (shortness of breath) on exertion Danielle Irwin does feel that she gets out of breath more easily that she used to when she exercises. Danielle Irwin's shortness of breath appears to be obesity related and exercise induced. She has agreed to work on weight loss and gradually increase exercise to treat her exercise induced shortness of breath. Will continue to monitor closely.  - CBC with Differential/Platelet  3. Prediabetes We have discussed treatment options which include: losing 7 to 10% of body weight, increasing physical activity to a goal of 150 minutes a week at moderate intensity.  Advised to maintain a diet low on simple and processed carbohydrates.  She may also be a candidate for pharmacoprophylaxis with metformin or incretin mimetic. Handout on insulin resistance was provided.   - Comprehensive metabolic panel - Hemoglobin A1c - Insulin, random  4. Vitamin D deficiency Check vitamin D levels today augment supplementation if still not at goal.  - VITAMIN D 25 Hydroxy (Vit-D Deficiency, Fractures)  5. Primary hypertension Continue with weight loss therapy. Losing 10% may improve blood pressure control. Monitor for symptoms of orthostasis while losing weight. Continue current regimen and home monitoring for a goal blood pressure of 120/80.   6. Pure hypercholesterolemia Continue weight loss therapy, losing 10% or  more of body weight may improve condition. Also advised to reduce saturated fats in diet to less than 10% of daily calories.  We will assess cardiovascular risk at the next office visit.  She may benefit from further risk stratification.  - Lipid Panel With LDL/HDL Ratio - CRP High sensitivity  7. Polycystic ovarian syndrome Continue with implementation of medically supervised weight loss plan.  She may also benefit from metformin or incretin therapy.  8. OSA (obstructive sleep apnea) Continue PAP therapy. Losing 15% or more of body weight may improve AHI. Patient was provided with handout that talks about OSA and its effect on hunger hormones and weight.  9. Physically inactive Patient will be counseled on physical activity goals at the next office visit.  10. Depression screen Danielle Irwin had a positive depression screening. Depression is commonly associated with obesity and often results in emotional eating behaviors. We will monitor this closely and work on CBT to help improve the non-hunger eating patterns. Referral to Psychology may be required if no improvement is seen as she continues in our clinic.  11. Morbid obesity (HCC), Starting BMI 58.13 Contributing factors: Genetics, medications, PCOS, OSA, low levels of physical activity.  Metabolism is slightly slower than calculated but within margin. Labs will be obtained today and reviewed  with the patient at her next appointment.   - TSH  Danielle Irwin is currently in the action stage of change and her goal is to continue with weight loss efforts. I recommend Danielle Irwin begin the structured treatment plan as follows:  She has agreed to the Category 3 Plan.  Exercise goals: All adults should avoid inactivity. Some physical activity is better than none, and adults who participate in any amount of physical activity gain some health benefits.   Behavioral modification strategies: increasing lean protein intake, decreasing simple carbohydrates, increasing  vegetables, increasing water intake, decreasing liquid calories, decreasing alcohol intake, decreasing sodium intake, increasing high fiber foods, decreasing eating out, no skipping meals, meal planning and cooking strategies, keeping healthy foods in the home, ways to avoid boredom eating, ways to avoid night time snacking, better snacking choices, avoiding temptations, and planning for success.  She was informed of the importance of frequent follow-up visits to maximize her success with intensive lifestyle modifications for her multiple health conditions. She was informed we would discuss her lab results at her next visit unless there is a critical issue that needs to be addressed sooner. Danielle Irwin agreed to keep her next visit at the agreed upon time to discuss these results.  Objective:   Blood pressure 128/76, pulse 70, temperature 98.7 F (37.1 C), height 5\' 7"  (1.702 m), weight (!) 364 lb (165.1 kg), last menstrual period 07/11/2016, SpO2 98%. Body mass index is 57.01 kg/m.  EKG: Normal sinus rhythm, rate 70 BPM.  Indirect Calorimeter completed today shows a VO2 of 305 and a REE of 2102.  Her calculated basal metabolic rate is 4696 thus her basal metabolic rate is worse than expected.  General: Cooperative, alert, well developed, in no acute distress. HEENT: Conjunctivae and lids unremarkable. Cardiovascular: Regular rhythm.  Lungs: Normal work of breathing. Neurologic: No focal deficits.   Lab Results  Component Value Date   CREATININE 0.82 02/11/2023   BUN 10 02/11/2023   NA 141 02/11/2023   K 4.5 02/11/2023   CL 103 02/11/2023   CO2 24 02/11/2023   Lab Results  Component Value Date   ALT 25 02/11/2023   AST 26 02/11/2023   ALKPHOS 52 02/11/2023   BILITOT 1.0 02/11/2023   Lab Results  Component Value Date   HGBA1C 5.7 (H) 02/11/2023   HGBA1C 6.0 07/18/2022   HGBA1C 5.7 12/24/2016   HGBA1C 5.3 10/26/2014   Lab Results  Component Value Date   INSULIN 21.9 02/11/2023    Lab Results  Component Value Date   TSH 3.200 02/11/2023   Lab Results  Component Value Date   CHOL 245 (H) 02/11/2023   HDL 50 02/11/2023   LDLCALC 170 (H) 02/11/2023   LDLDIRECT 129.8 08/21/2010   TRIG 138 02/11/2023   CHOLHDL 5 07/18/2022   Lab Results  Component Value Date   WBC WILL FOLLOW 02/11/2023   HGB WILL FOLLOW 02/11/2023   HCT WILL FOLLOW 02/11/2023   MCV WILL FOLLOW 02/11/2023   PLT WILL FOLLOW 02/11/2023   Lab Results  Component Value Date   IRON 38 08/02/2017   TIBC 279 08/02/2017   FERRITIN 60 08/02/2017   Attestation Statements:   Reviewed by clinician on day of visit: allergies, medications, problem list, medical history, surgical history, family history, social history, and previous encounter notes.  I have spent 60 minutes in the care of the patient today including: preparing to see patient (e.g. review and interpretation of tests, old notes ), obtaining and/or reviewing separately  obtained history, performing a medically appropriate examination or evaluation, counseling and educating the patient, ordering medications, test or procedures, documenting clinical information in the electronic or other health care record, and independently interpreting results and communicating results to the patient, family, or caregiver.  Trude Mcburney, am acting as transcriptionist for Worthy Rancher, MD.  I have reviewed the above documentation for accuracy and completeness, and I agree with the above. -Worthy Rancher, MD

## 2023-02-11 NOTE — Assessment & Plan Note (Signed)
Secondary to chronic fatigue and orthopedic problems and chronic pain.  Patient will be counseled on physical activity goals at the next office visit.

## 2023-02-11 NOTE — Assessment & Plan Note (Signed)
Contributing factors: Genetics, medications, PCOS, OSA, low levels of physical activity.  Metabolism is slightly slower than calculated but within margin.

## 2023-02-11 NOTE — Assessment & Plan Note (Signed)
On CPAP with reported good compliance. Continue PAP therapy. Losing 15% or more of body weight may improve AHI.    Patient was provided with handout that talks about OSA and its effect on hunger hormones and weight.

## 2023-02-11 NOTE — Assessment & Plan Note (Signed)
This is also a complex condition.  I suspect she may have elevated androgen levels and hyperinsulinemia.  Continue with implementation of medically supervised weight loss plan.  She may also benefit from metformin or incretin therapy.

## 2023-02-11 NOTE — Assessment & Plan Note (Signed)
Most recent vitamin D levels  Lab Results  Component Value Date   VD25OH 17.54 (L) 07/18/2022   VD25OH 26.79 (L) 05/12/2018   VD25OH 39.10 10/30/2015     Deficiency state associated with adiposity and may result in leptin resistance, weight gain and fatigue. Currently on vitamin D supplementation without any adverse effects.  Plan: Check vitamin D levels today augment supplementation if still not at goal.

## 2023-02-11 NOTE — Assessment & Plan Note (Signed)
Blood pressure at goal for age and risk category.  On lisinopril 10 mg once a day without adverse effects.  Most recent renal parameters reviewed which showed normal electrolytes and kidney function.  Continue with weight loss therapy. Losing 10% may improve blood pressure control. Monitor for symptoms of orthostasis while losing weight. Continue current regimen and home monitoring for a goal blood pressure of 120/80.

## 2023-02-11 NOTE — Assessment & Plan Note (Signed)
LDL is not at goal. Elevated LDL may be secondary to nutrition, genetics and spillover effect from excess adiposity. Recommended LDL goal is <70 to reduce the risk of fatty streaks and the progression to obstructive ASCVD in the future.  She has family history of premature coronary artery disease her father had heart attacks in his 31s.  She is not on statin therapy  Her 10 year risk is: The 10-year ASCVD risk score (Arnett DK, et al., 2019) is: 2.8%  Lab Results  Component Value Date   CHOL 224 (H) 07/18/2022   HDL 46.70 07/18/2022   LDLCALC 159 (H) 07/18/2022   LDLDIRECT 129.8 08/21/2010   TRIG 93.0 07/18/2022   CHOLHDL 5 07/18/2022    Continue weight loss therapy, losing 10% or more of body weight may improve condition. Also advised to reduce saturated fats in diet to less than 10% of daily calories.  We will assess cardiovascular risk at the next office visit.  She may benefit from further risk stratification.

## 2023-02-12 LAB — TSH: TSH: 3.2 u[IU]/mL (ref 0.450–4.500)

## 2023-02-12 LAB — INSULIN, RANDOM: INSULIN: 21.9 u[IU]/mL (ref 2.6–24.9)

## 2023-02-12 LAB — LIPID PANEL WITH LDL/HDL RATIO
Cholesterol, Total: 245 mg/dL — ABNORMAL HIGH (ref 100–199)
HDL: 50 mg/dL (ref >39)
LDL Chol Calc (NIH): 170 mg/dL — ABNORMAL HIGH (ref 0–99)
LDL/HDL Ratio: 3.4 ratio — ABNORMAL HIGH (ref 0.0–3.2)
Triglycerides: 138 mg/dL (ref 0–149)
VLDL Cholesterol Cal: 25 mg/dL (ref 5–40)

## 2023-02-12 LAB — CBC WITH DIFFERENTIAL/PLATELET

## 2023-02-12 LAB — COMPREHENSIVE METABOLIC PANEL
ALT: 25 IU/L (ref 0–32)
AST: 26 IU/L (ref 0–40)
Albumin: 4.4 g/dL (ref 3.8–4.9)
Alkaline Phosphatase: 52 IU/L (ref 44–121)
BUN/Creatinine Ratio: 12 (ref 9–23)
BUN: 10 mg/dL (ref 6–24)
Bilirubin Total: 1 mg/dL (ref 0.0–1.2)
CO2: 24 mmol/L (ref 20–29)
Calcium: 9.4 mg/dL (ref 8.7–10.2)
Chloride: 103 mmol/L (ref 96–106)
Creatinine, Ser: 0.82 mg/dL (ref 0.57–1.00)
Globulin, Total: 2.6 g/dL (ref 1.5–4.5)
Glucose: 109 mg/dL — ABNORMAL HIGH (ref 70–99)
Potassium: 4.5 mmol/L (ref 3.5–5.2)
Sodium: 141 mmol/L (ref 134–144)
Total Protein: 7 g/dL (ref 6.0–8.5)
eGFR: 86 mL/min/{1.73_m2} (ref >59)

## 2023-02-12 LAB — HIGH SENSITIVITY CRP: CRP, High Sensitivity: 1.45 mg/L (ref 0.00–3.00)

## 2023-02-12 LAB — VITAMIN B12: Vitamin B-12: 467 pg/mL (ref 232–1245)

## 2023-02-12 LAB — HEMOGLOBIN A1C
Est. average glucose Bld gHb Est-mCnc: 117 mg/dL
Hgb A1c MFr Bld: 5.7 % — ABNORMAL HIGH (ref 4.8–5.6)

## 2023-02-12 LAB — VITAMIN D 25 HYDROXY (VIT D DEFICIENCY, FRACTURES): Vit D, 25-Hydroxy: 29.8 ng/mL — ABNORMAL LOW (ref 30.0–100.0)

## 2023-02-25 ENCOUNTER — Ambulatory Visit (INDEPENDENT_AMBULATORY_CARE_PROVIDER_SITE_OTHER): Payer: BC Managed Care – PPO | Admitting: Family Medicine

## 2023-02-25 ENCOUNTER — Encounter (INDEPENDENT_AMBULATORY_CARE_PROVIDER_SITE_OTHER): Payer: Self-pay | Admitting: Family Medicine

## 2023-02-25 ENCOUNTER — Other Ambulatory Visit: Payer: Self-pay | Admitting: Nurse Practitioner

## 2023-02-25 VITALS — BP 111/67 | HR 83 | Temp 97.6°F | Ht 67.0 in | Wt 361.0 lb

## 2023-02-25 DIAGNOSIS — E782 Mixed hyperlipidemia: Secondary | ICD-10-CM | POA: Diagnosis not present

## 2023-02-25 DIAGNOSIS — E559 Vitamin D deficiency, unspecified: Secondary | ICD-10-CM

## 2023-02-25 DIAGNOSIS — R7303 Prediabetes: Secondary | ICD-10-CM

## 2023-02-25 DIAGNOSIS — Z6841 Body Mass Index (BMI) 40.0 and over, adult: Secondary | ICD-10-CM

## 2023-02-25 MED ORDER — ROSUVASTATIN CALCIUM 5 MG PO TABS
5.0000 mg | ORAL_TABLET | Freq: Every day | ORAL | 0 refills | Status: AC
Start: 2023-02-25 — End: ?

## 2023-02-25 MED ORDER — VITAMIN D (ERGOCALCIFEROL) 1.25 MG (50000 UNIT) PO CAPS
ORAL_CAPSULE | ORAL | 0 refills | Status: DC
Start: 2023-02-25 — End: 2023-04-08

## 2023-02-25 NOTE — Progress Notes (Signed)
Danielle Irwin, D.O.  ABFM, ABOM Clinical Bariatric Medicine Physician  Office located at: [address] [place], [place]  [address]     Assessment and Plan:   Medications Discontinued During This Encounter  Medication Reason   traZODone (DESYREL) 50 MG tablet Duplicate   traMADol (ULTRAM) 50 MG tablet Patient Preference   SUMAtriptan (IMITREX) 25 MG tablet Patient Preference   VITAMIN D, ERGOCALCIFEROL, PO Dose change     Meds ordered this encounter  Medications   Vitamin D, Ergocalciferol, (DRISDOL) 1.25 MG (50000 UNIT) CAPS capsule    Sig: 1 po q Wed and 1 po q Sun    Dispense:  8 capsule    Refill:  0   rosuvastatin (CRESTOR) 5 MG tablet    Sig: Take 1 tablet (5 mg total) by mouth at bedtime.    Dispense:  90 tablet    Refill:  0    Prediabetes Assessment & Plan: Lab Results  Component Value Date   HGBA1C 5.7 (H) 02/11/2023   HGBA1C 6.0 07/18/2022   HGBA1C 5.7 12/24/2016   INSULIN 21.9 02/11/2023   Lab Results  Component Value Date   CREATININE 0.82 02/11/2023   BUN 10 02/11/2023   NA 141 02/11/2023   K 4.5 02/11/2023   CL 103 02/11/2023   CO2 24 02/11/2023      Component Value Date/Time   PROT 7.0 02/11/2023 1026   ALBUMIN 4.4 02/11/2023 1026   AST 26 02/11/2023 1026   ALT 25 02/11/2023 1026   ALKPHOS 52 02/11/2023 1026   BILITOT 1.0 02/11/2023 1026   BILIDIR 0.2 08/21/2010 0911   Lab Results  Component Value Date   TSH 3.200 02/11/2023   Lab Results  Component Value Date   VITAMINB12 467 02/11/2023   A1c has improved from 6.0 to 5.7. Fasting insulin is roughly 4 times normal. Glucose levels are slightly elevated at 109. Kidney function, electrolytes, and liver enzymes are within recommended limits. No concerns with TSH levels. B12 is not at goal of 500+; pt takes a daily b complex vitamins tablet. I anticipate levels to increase as pt continues their b complex and B12 rich nutritional plan.   Drink at least half of your weight in  ounces of water per day unless otherwise noted by one of your doctors that you must restrict water intake.   I counseled patient on pathophysiology of the disease process of Pre-DM.  Explained role of simple carbs and insulin levels on hunger and cravings Continue to decrease simple carbs/ sugars; increase fiber and proteins -> follow her meal plan.  Danielle Irwin will continue to work on weight loss, exercise, via their meal plan we devised to help decrease the risk of progressing to diabetes.    Vitamin D deficiency Assessment & Plan: Lab Results  Component Value Date   VD25OH 29.8 (L) 02/11/2023   VD25OH 17.54 (L) 07/18/2022   VD25OH 26.79 (L) 05/12/2018   Condition treated with OTC ERGO 10,000 units daily. Vit D levels have improved from 17.54 to 29.8, however levels are not within the recommended range of 50 to 70-80.   I discussed the importance of vitamin D to the patient's health and well-being as well as to their ability to lose weight. I reviewed possible symptoms of low Vitamin D:  low energy, depressed mood, muscle aches, joint aches, osteoporosis etc. with patient. It has been show that administration of vitamin D supplementation leads to improved satiety and a decrease in inflammatory markers.  Hence, low Vitamin D levels may be linked to an increased risk of cardiovascular events and even increased risk of cancers- such as colon and breast. We agreed mutually to switch to ERGO 50,000 units twice wkly.    Mixed hyperlipidemia  Assessment & Plan: Lab Results  Component Value Date   CHOL 245 (H) 02/11/2023   HDL 50 02/11/2023   LDLCALC 170 (H) 02/11/2023   LDLDIRECT 129.8 08/21/2010   TRIG 138 02/11/2023   CHOLHDL 5 07/18/2022   The 10-year ASCVD risk score (Arnett DK, et al., 2019) is: 2.2%   Values used to calculate the score:     Age: 52 years     Sex: Female     Is Non-Hispanic African American: No     Diabetic: No     Tobacco smoker: No     Systolic Blood Pressure: 111  mmHg     Is BP treated: Yes     HDL Cholesterol: 50 mg/dL     Total Cholesterol: 245 mg/dL  Danielle Irwin has a strong fhx of premature coronary artery disease. Her father had a heart attack in his early 2s and her brother had one in his late 63s. She is not on statin therapy. Total Cholesterol and LDL are elevated at 245 and 170 respectively. Recommended LDL goal is <70. Pt also has past history of elevated TRIG (150 roughly 6 yrs ago).   Pt has been instructed to begin Crestor 5 mg daily. I stressed the importance that patient continue with our prudent nutritional plan that is low in saturated and trans fats, and low in fatty carbs to improve these numbers.  We recommend: aerobic activity with eventual goal of a minimum of 150+ min wk plus 2 days/ week of resistance or strength training. We will continue routine screening.    Morbid obesity (HCC), Starting BMI 58.13 Assessment & Plan: Since last office visit patient's  Muscle mass has increased by 0.4 lb. Fat mass has decreased by 3.8 lb. Total body water is 113.6 lbs. Counseling done on how various foods will affect these numbers and how to maximize success  Total lbs lost to date: - 3 lbs.  Total weight loss percentage to date: 0.82%   No change to meal plan - see Subjective  Behavioral Intervention Additional resources provided today:  Healthy Eating Principles Handout,  [employer] , Prediabetes & I.R handout.  Evidence-based interventions for health behavior change were utilized today including the discussion of self monitoring techniques, problem-solving barriers and SMART goal setting techniques.   Regarding patient's less desirable eating habits and patterns, we employed the technique of small changes.  Pt will specifically work on: measuring proteins/veggies & doing 5 minutes of purposeful exercise daily.   FOLLOW UP: Return in 3 weeks (on 03/18/2023), or in 2-3 wks. She was informed of the importance of frequent follow up visits  to maximize her success with intensive lifestyle modifications for her multiple health conditions.  Subjective:   Chief complaint: Obesity Danielle Irwin is here to discuss her progress with her obesity treatment plan. She is on the Category 3 Plan with B/L options and states she is following her eating plan approximately 30 % of the time. She states she is walking 1700-2000 steps 5 days a wk.   Interval History:  Danielle Irwin is here today for her first follow-up office visit since starting the program with [place]. This is my first time meeting Danielle Irwin; she was initially seen by Dr.Maldonado. Danielle Irwin lives with  her parents. Her mother prepares the food in their house and she makes a lot of "soft foods" like rice & pasta. For protein sources, Danielle Irwin typically eats chicken and venison. Reports that she did not measure her proteins and veggie intake. Drinks anywhere between 40-120 ounces of water daily.   All blood work/ lab tests that were recently ordered by myself or an outside provider were reviewed with patient today per their request. Extended time was spent counseling her on all new disease processes that were discovered or preexisting ones that are affected by BMI.  she understands that many of these abnormalities will need to monitored regularly along with the current treatment plan of prudent dietary changes, in which we are making each and every office visit, to improve these health parameters.  Review of Systems:  Pertinent positives were addressed with patient today.  Reviewed by clinician on day of visit: allergies, medications, problem list, medical history, surgical history, family history, social history, and previous encounter notes.  Weight Summary and Biometrics   Weight Lost Since Last Visit: 3  Weight Gained Since Last Visit: 0  Vitals Temp: 97.6 F (36.4 C) BP: 111/67 Pulse Rate: 83 SpO2: 96 %   Anthropometric Measurements Height: 5\' 7"  (1.702 m) Weight: (!) 361 lb (163.7 kg) BMI  (Calculated): 56.53 Weight at Last Visit: 364 lb Weight Lost Since Last Visit: 3 Weight Gained Since Last Visit: 0 Starting Weight: 364 lb Total Weight Loss (lbs): 3 lb (1.361 kg) Peak Weight: 380 lb   Body Composition  Body Fat %: 58.5 % Fat Mass (lbs): 211.4 lbs Muscle Mass (lbs): 142.2 lbs Total Body Water (lbs): 113.6 lbs Visceral Fat Rating : 24   Other Clinical Data Labs: no Today's Visit #: 2 Starting Date: 02/11/23   Objective:   PHYSICAL EXAM:  Blood pressure 111/67, pulse 83, temperature 97.6 F (36.4 C), height 5\' 7"  (1.702 m), weight (!) 361 lb (163.7 kg), last menstrual period 07/11/2016, SpO2 96%. Body mass index is 56.54 kg/m.  General: Well Developed, well nourished, and in no acute distress.  HEENT: Normocephalic, atraumatic Skin: Warm and dry, cap RF less 2 sec, good turgor Chest:  Normal excursion, shape, no gross abn Respiratory: speaking in full sentences, no conversational dyspnea NeuroM-Sk: Ambulates w/o assistance, moves * 4 Psych: A and O *3, insight good, mood-full  DIAGNOSTIC DATA REVIEWED:  BMET    Component Value Date/Time   NA 141 02/11/2023 1026   K 4.5 02/11/2023 1026   CL 103 02/11/2023 1026   CO2 24 02/11/2023 1026   GLUCOSE 109 (H) 02/11/2023 1026   GLUCOSE 136 (H) 12/19/2022 1304   BUN 10 02/11/2023 1026   CREATININE 0.82 02/11/2023 1026   CALCIUM 9.4 02/11/2023 1026   GFRNONAA >60 08/02/2017 0524   GFRAA >60 08/02/2017 0524   Lab Results  Component Value Date   HGBA1C 5.7 (H) 02/11/2023   HGBA1C 5.3 10/26/2014   Lab Results  Component Value Date   INSULIN 21.9 02/11/2023   Lab Results  Component Value Date   TSH 3.200 02/11/2023   CBC    Component Value Date/Time   WBC CANCELED 02/11/2023 1026   WBC 7.0 12/19/2022 1304   RBC CANCELED 02/11/2023 1026   RBC 4.84 12/19/2022 1304   HGB CANCELED 02/11/2023 1026   HCT CANCELED 02/11/2023 1026   PLT CANCELED 02/11/2023 1026   MCV 93.6 12/19/2022 1304   MCH  31.0 08/03/2017 0342   MCHC 33.3 12/19/2022 1304   RDW  12.6 12/19/2022 1304   Iron Studies    Component Value Date/Time   IRON 38 08/02/2017 0524   TIBC 279 08/02/2017 0524   FERRITIN 60 08/02/2017 0524   IRONPCTSAT 14 08/02/2017 0524   Lipid Panel     Component Value Date/Time   CHOL 245 (H) 02/11/2023 1026   TRIG 138 02/11/2023 1026   HDL 50 02/11/2023 1026   CHOLHDL 5 07/18/2022 0925   VLDL 18.6 07/18/2022 0925   LDLCALC 170 (H) 02/11/2023 1026   LDLDIRECT 129.8 08/21/2010 0911   Hepatic Function Panel     Component Value Date/Time   PROT 7.0 02/11/2023 1026   ALBUMIN 4.4 02/11/2023 1026   AST 26 02/11/2023 1026   ALT 25 02/11/2023 1026   ALKPHOS 52 02/11/2023 1026   BILITOT 1.0 02/11/2023 1026   BILIDIR 0.2 08/21/2010 0911      Component Value Date/Time   TSH 3.200 02/11/2023 1026   Nutritional Lab Results  Component Value Date   VD25OH 29.8 (L) 02/11/2023   VD25OH 17.54 (L) 07/18/2022   VD25OH 26.79 (L) 05/12/2018    Attestations:   Reviewed by clinician on day of visit: allergies, medications, problem list, medical history, surgical history, family history, social history, and previous encounter notes.   Patient was in the office today and time spent on visit including pre-visit chart review and post-visit care/coordination of care and electronic medical record documentation was 70 minutes. 50% of the time was in face to face counseling of this patient's medical condition(s) and providing education on treatment options to include the first-line treatment of diet and lifestyle modification.  I, Special Randolm Idol, acting as a Stage manager for [employer], DO., have compiled all relevant documentation for today's office visit on behalf of Thomasene Lot, DO, while in the presence of [employer], DO.  I have reviewed the above documentation for accuracy and completeness, and I agree with the above. Danielle Irwin, D.O.  The 21st Century Cures Act was  signed into law in 2016 which includes the topic of electronic health records.  This provides immediate access to information in MyChart.  This includes consultation notes, operative notes, office notes, lab results and pathology reports.  If you have any questions about what you read please let us know at your next visit so we can discuss your concerns and take corrective action if need be.  We are right here with you.

## 2023-02-25 NOTE — Patient Instructions (Signed)
The 10-year ASCVD risk score (Arnett DK, et al., 2019) is: 2.2%   Values used to calculate the score:     Age: 52 years     Sex: Female     Is Non-Hispanic African American: No     Diabetic: No     Tobacco smoker: No     Systolic Blood Pressure: 111 mmHg     Is BP treated: Yes     HDL Cholesterol: 50 mg/dL     Total Cholesterol: 245 mg/dL

## 2023-03-05 ENCOUNTER — Encounter: Payer: Self-pay | Admitting: Nurse Practitioner

## 2023-03-05 ENCOUNTER — Ambulatory Visit: Payer: BC Managed Care – PPO | Admitting: Nurse Practitioner

## 2023-03-05 VITALS — BP 128/72 | HR 91 | Temp 99.0°F | Ht 67.0 in | Wt 360.8 lb

## 2023-03-05 DIAGNOSIS — Z1152 Encounter for screening for COVID-19: Secondary | ICD-10-CM | POA: Diagnosis not present

## 2023-03-05 DIAGNOSIS — B372 Candidiasis of skin and nail: Secondary | ICD-10-CM

## 2023-03-05 DIAGNOSIS — R6889 Other general symptoms and signs: Secondary | ICD-10-CM

## 2023-03-05 DIAGNOSIS — J989 Respiratory disorder, unspecified: Secondary | ICD-10-CM | POA: Diagnosis not present

## 2023-03-05 LAB — POCT INFLUENZA A/B
Influenza A, POC: NEGATIVE
Influenza B, POC: NEGATIVE

## 2023-03-05 LAB — POC COVID19 BINAXNOW: SARS Coronavirus 2 Ag: NEGATIVE

## 2023-03-05 MED ORDER — DOXYCYCLINE HYCLATE 100 MG PO TABS
100.0000 mg | ORAL_TABLET | Freq: Two times a day (BID) | ORAL | 0 refills | Status: DC
Start: 2023-03-05 — End: 2023-03-18

## 2023-03-05 MED ORDER — PSEUDOEPH-BROMPHEN-DM 30-2-10 MG/5ML PO SYRP
5.0000 mL | ORAL_SOLUTION | Freq: Four times a day (QID) | ORAL | 0 refills | Status: DC | PRN
Start: 2023-03-05 — End: 2023-03-28

## 2023-03-05 MED ORDER — PREDNISONE 20 MG PO TABS
40.0000 mg | ORAL_TABLET | Freq: Every day | ORAL | 0 refills | Status: DC
Start: 2023-03-05 — End: 2023-03-18

## 2023-03-05 NOTE — Progress Notes (Signed)
Bethanie Dicker, NP-C Phone: [phone]  Danielle Irwin is a 52 y.o. female who presents today for flu like symptoms.   Patient with symptoms x 1 week, they have been gradually worsening.  Respiratory illness:  Cough- Yes  Congestion-    Sinus- Yes   Chest- No  Post nasal drip- Yes  Sore throat- Irritated  Shortness of breath- No  Fever- Yes, Tmax 101  Fatigue/Myalgia- Yes Headache- Yes Nausea/Vomiting- Yes, vomit x 1 Taste disturbance- No  Smell disturbance- No  Covid exposure- No  Covid vaccination- Never  Flu vaccination- Never  Medications- Dayquil, Nyquil, Tylenol/Ibuprofen   Social History   Tobacco Use  Smoking Status Former   Current packs/day: 0.00   Types: Cigarettes   Quit date: 05/26/1990   Years since quitting: 32.8  Smokeless Tobacco Never    Current Outpatient Medications on File Prior to Visit  Medication Sig Dispense Refill   acetaminophen (TYLENOL) 650 MG CR tablet Take 650 mg by mouth every 8 (eight) hours as needed for pain.     albuterol (VENTOLIN HFA) 108 (90 Base) MCG/ACT inhaler Inhale 2 puffs into the lungs every 6 (six) hours as needed for wheezing or shortness of breath. 8 g 2   Alum & Mag Hydroxide-Simeth (ANTACID ANTI-GAS PO) Take 1 tablet by mouth daily as needed (for gas).     Ascorbic Acid (VITAMIN C) 1000 MG tablet Take 1,000 mg by mouth 2 (two) times daily.     aspirin EC 81 MG tablet Take 81 mg by mouth daily. Swallow whole.     aspirin-acetaminophen-caffeine (EXCEDRIN MIGRAINE) 250-250-65 MG tablet Take 2 tablets by mouth 2 (two) times daily as needed for headache or migraine.     b complex vitamins tablet Take 1 tablet by mouth daily.     bismuth subsalicylate (PEPTO-BISMOL) 262 MG/15ML suspension Take 30 mLs by mouth 4 (four) times daily -  before meals and at bedtime. 360 mL 0   budesonide-formoterol (SYMBICORT) 80-4.5 MCG/ACT inhaler Inhale 2 puffs into the lungs 2 (two) times daily. 1 each 3   Calcium Carbonate-Vitamin D  (CALCIUM-D PO) Take 1,200 mg by mouth 2 (two) times daily.     cyclobenzaprine (FLEXERIL) 10 MG tablet Take 1 tablet by mouth three times daily as needed for muscle spasm 30 tablet 0   ECHINACEA-GOLDEN SEAL PO Take 1 tablet by mouth 2 (two) times daily as needed (immune system boost).     EPINEPHrine 0.3 mg/0.3 mL IJ SOAJ injection INJECT 0.3ML INTO THE MUSCLE ONCE AS DIRECTED 2 each 0   furosemide (LASIX) 20 MG tablet Take 1 tablet (20 mg total) by mouth daily. 30 tablet 3   hydrochlorothiazide (HYDRODIURIL) 25 MG tablet Take 25 mg by mouth daily.     hydrOXYzine (VISTARIL) 25 MG capsule Take 1 capsule (25 mg total) by mouth every 8 (eight) hours as needed. 90 capsule 2   lisinopril (ZESTRIL) 10 MG tablet Take 1 tablet (10 mg total) by mouth daily. 90 tablet 0   Magnesium 500 MG TABS Take 500 mg by mouth at bedtime. Dosage varies     Melatonin 10 MG TABS Take 1 tablet by mouth at bedtime.     Multiple Vitamin (MULTIVITAMIN WITH MINERALS) TABS tablet Take 1 tablet by mouth daily.     Multiple Vitamins-Minerals (ZINC PO) Take 500 mg by mouth daily as needed (immune system boost).     ondansetron (ZOFRAN) 4 MG tablet TAKE 1 TABLET BY MOUTH EVERY 8 HOURS AS NEEDED FOR  NAUSEA FOR VOMITING 30 tablet 0   rosuvastatin (CRESTOR) 5 MG tablet Take 1 tablet (5 mg total) by mouth at bedtime. 90 tablet 0   sertraline (ZOLOFT) 100 MG tablet Take 1 tablet (100 mg total) by mouth daily. 90 tablet 2   traZODone (DESYREL) 100 MG tablet Take 1 tablet (100 mg total) by mouth at bedtime. 90 tablet 1   Ubrogepant (UBRELVY) 50 MG TABS Take 50-100mg  at headache onset. Can repeat after 2 hours if needed. Do not exceed 200mg  in 24 hours. 30 tablet 0   Vitamin D, Ergocalciferol, (DRISDOL) 1.25 MG (50000 UNIT) CAPS capsule 1 po q Wed and 1 po q Sun 8 capsule 0   vitamin k 100 MCG tablet Take 100 mcg by mouth daily.     No current facility-administered medications on file prior to visit.     ROS see history of present  illness  Objective  Physical Exam Vitals:   03/05/23 1511  BP: 128/72  Pulse: 91  Temp: 99 F (37.2 C)  SpO2: 96%    BP Readings from Last 3 Encounters:  03/05/23 128/72  02/25/23 111/67  02/11/23 128/76   Wt Readings from Last 3 Encounters:  03/05/23 (!) 360 lb 12.8 oz (163.7 kg)  02/25/23 (!) 361 lb (163.7 kg)  02/11/23 (!) 364 lb (165.1 kg)    Physical Exam Constitutional:      General: She is not in acute distress.    Appearance: Normal appearance.  HENT:     Head: Normocephalic.     Right Ear: Tympanic membrane normal.     Left Ear: Tympanic membrane normal.     Nose: Congestion present.     Mouth/Throat:     Mouth: Mucous membranes are moist.     Pharynx: Oropharynx is clear.  Eyes:     Conjunctiva/sclera: Conjunctivae normal.     Pupils: Pupils are equal, round, and reactive to light.  Cardiovascular:     Rate and Rhythm: Normal rate and regular rhythm.     Heart sounds: Normal heart sounds.  Pulmonary:     Effort: Pulmonary effort is normal.     Breath sounds: Normal breath sounds.  Lymphadenopathy:     Cervical: No cervical adenopathy.  Skin:    General: Skin is warm and dry.     Findings: Rash present.     Comments: Consistent with yeast- red and inflamed under bilateral armpits. Left > right  Neurological:     General: No focal deficit present.     Mental Status: She is alert.  Psychiatric:        Mood and Affect: Mood normal.        Behavior: Behavior normal.     Assessment/Plan: Please see individual problem list.  Respiratory illness Assessment & Plan: COVID and flu testing negative. Symptoms x 1 week, worsening. Will treat with Doxycycline, Prednisone and Bromfed DM. Counseled on common side effects. She can continue Tylenol and Ibuprofen as needed. Encouraged adequate fluid intake. Paperwork completed for work, work note also provided. Return precautions given to patient.   Orders: -     Doxycycline Hyclate; Take 1 tablet (100 mg  total) by mouth 2 (two) times daily.  Dispense: 14 tablet; Refill: 0 -     predniSONE; Take 2 tablets (40 mg total) by mouth daily.  Dispense: 10 tablet; Refill: 0 -     Pseudoeph-Bromphen-DM; Take 5-10 mLs by mouth every 6 (six) hours as needed.  Dispense: 120 mL; Refill: 0  Candidiasis, cutaneous Assessment & Plan: Bilateral armpits- left > right. Painful. Will treat with Nystatin powder.  Orders: -     Nystatin; Apply 1 Application topically 3 (three) times daily.  Dispense: 60 g; Refill: 1  Flu-like symptoms -     POCT Influenza A/B  Encounter for screening for COVID-19 -     POC COVID-19 BinaxNow    Return if symptoms worsen or fail to improve.   Bethanie Dicker, NP-C [facility] - [employer]

## 2023-03-10 DIAGNOSIS — B372 Candidiasis of skin and nail: Secondary | ICD-10-CM | POA: Insufficient documentation

## 2023-03-10 MED ORDER — NYSTATIN 100000 UNIT/GM EX POWD
1.0000 | Freq: Three times a day (TID) | CUTANEOUS | 1 refills | Status: AC
Start: 2023-03-10 — End: ?

## 2023-03-10 NOTE — Assessment & Plan Note (Addendum)
COVID and flu testing negative. Symptoms x 1 week, worsening. Will treat with Doxycycline, Prednisone and Bromfed DM. Counseled on common side effects. She can continue Tylenol and Ibuprofen as needed. Encouraged adequate fluid intake. Paperwork completed for work, work note also provided. Return precautions given to patient.

## 2023-03-10 NOTE — Assessment & Plan Note (Signed)
Bilateral armpits- left > right. Painful. Will treat with Nystatin powder.

## 2023-03-11 ENCOUNTER — Encounter: Payer: Self-pay | Admitting: Nurse Practitioner

## 2023-03-11 ENCOUNTER — Ambulatory Visit: Payer: BC Managed Care – PPO | Admitting: Nurse Practitioner

## 2023-03-11 VITALS — BP 134/86 | HR 81 | Temp 97.8°F | Ht 67.0 in | Wt 364.0 lb

## 2023-03-11 DIAGNOSIS — J989 Respiratory disorder, unspecified: Secondary | ICD-10-CM

## 2023-03-11 DIAGNOSIS — R062 Wheezing: Secondary | ICD-10-CM | POA: Insufficient documentation

## 2023-03-11 MED ORDER — AMOXICILLIN-POT CLAVULANATE 875-125 MG PO TABS: 1.0000 | ORAL_TABLET | Freq: Two times a day (BID) | ORAL | 0 refills | Status: DC

## 2023-03-11 NOTE — Progress Notes (Unsigned)
Established Patient Office Visit  Subjective:  Patient ID: Danielle Irwin, female    DOB: 22-Aug-1970  Age: 52 y.o. MRN: [id number]  CC:  Chief Complaint  Patient presents with   Acute Visit    Still sick- Coughing Sore throat Bodyaches Headache Drainage from head pale yellow mucous Intermittent fever x 2 weeks Coughing is so hard it causes the Patient to vomit,pee & poop herself    HPI  Danielle Irwin presents for URI symptoms. Was seen on 9/25 and started on doxycycline, prednisone and Bromofed. She states has episodes of frequent coughing fits to followed by posttussive emesis, urine and fecal incontinence. She has facial pain, fever, chills, sorethroat, body aches the symptoms has worsened compared to previous visit.  Medication: Tylenol, DayQuil, NyQuil, Delsym and Flonase. HPI   Past Medical History:  Diagnosis Date   Anemia    Anxiety    Asthma    Back pain    Depression    Edema of both lower extremities    Epstein Barr infection    Family history of adverse reaction to anesthesia    FATHER WAS SLOW TO WAKE UP   Fibromyalgia    GERD (gastroesophageal reflux disease)    H/O blood clots    Headache    OCCASIONAL MIGRAINES   History of fibromyalgia    HNP (herniated nucleus pulposus), lumbar    Hx of epistaxis    Hypertension    Insomnia    Joint pain    OA (osteoarthritis)    Obesity    PCOS (polycystic ovarian syndrome)    Sensitive skin    Sleep apnea    Vitamin D deficiency     Past Surgical History:  Procedure Laterality Date   BREAST BIOPSY Left 09/17/2022   Stereo bx, Ribbon Clip, path pending   BREAST BIOPSY Left 09/17/2022   MM LT BREAST BX W LOC DEV 1ST LESION IMAGE BX SPEC STEREO GUIDE 09/17/2022 [facility]   COLONOSCOPY WITH PROPOFOL N/A 09/09/2022   Procedure: COLONOSCOPY WITH PROPOFOL;  Surgeon: Wyline Mood, MD;  Location: [facility] ENDOSCOPY;  Service: Gastroenterology;  Laterality: N/A;   LUMBAR LAMINECTOMY/DECOMPRESSION MICRODISCECTOMY  Right 06/02/2014   Procedure: MICRO LUMBAR DECOMPRESSION L4 - L5 ON THE RIGHT 1 LEVEL;  Surgeon: Javier Docker, MD;  Location: [facility];  Service: Orthopedics;  Laterality: Right;   ORIF HUMERUS FRACTURE Left 08/01/2017   Procedure: OPEN REDUCTION INTERNAL FIXATION (ORIF) PROXIMAL HUMERUS FRACTURE;  Surgeon: Roby Lofts, MD;  Location: [facility] OR;  Service: Orthopedics;  Laterality: Left;   WISDOM TOOTH EXTRACTION      Family History  Problem Relation Age of Onset   Diabetes Mother    High blood pressure Mother    High Cholesterol Mother    Depression Mother    Anxiety disorder Mother    Sleep apnea Mother    Obesity Mother    Heart attack Father 43   High blood pressure Father    High Cholesterol Father    Heart disease Father    Breast cancer Maternal Aunt 51   Ovarian cancer Neg Hx     Social History   Socioeconomic History   Marital status: Single    Spouse name: Not on file   Number of children: Not on file   Years of education: Not on file   Highest education level: Some college, no degree  Occupational History   Occupation: debt collections  Tobacco Use   Smoking status: Former  Current packs/day: 0.00    Types: Cigarettes    Quit date: 05/26/1990    Years since quitting: 32.8   Smokeless tobacco: Never  Vaping Use   Vaping status: Never Used  Substance and Sexual Activity   Alcohol use: Yes    Comment: occ   Drug use: No   Sexual activity: Not Currently    Partners: Male    Birth control/protection: Post-menopausal  Other Topics Concern   Not on file  Social History Narrative   Not on file   Social Determinants of Health   Financial Resource Strain: High Risk (09/24/2022)   Overall Financial Resource Strain (CARDIA)    Difficulty of Paying Living Expenses: Hard  Food Insecurity: Food Insecurity Present (09/24/2022)   Hunger Vital Sign    Worried About Running Out of Food in the Last Year: Sometimes true    Ran Out of Food in the Last Year: Never  true  Transportation Needs: No Transportation Needs (09/24/2022)   PRAPARE - Administrator, Civil Service (Medical): No    Lack of Transportation (Non-Medical): No  Physical Activity: Unknown (09/24/2022)   Exercise Vital Sign    Days of Exercise per Week: 0 days    Minutes of Exercise per Session: Not on file  Stress: Stress Concern Present (09/24/2022)   [employer] of Occupational Health - Occupational Stress Questionnaire    Feeling of Stress : Rather much  Social Connections: Unknown (09/24/2022)   Social Connection and Isolation Panel [NHANES]    Frequency of Communication with Friends and Family: Patient declined    Frequency of Social Gatherings with Friends and Family: Patient declined    Attends Religious Services: Patient declined    Database administrator or Organizations: No    Attends Engineer, structural: Not on file    Marital Status: Never married  Intimate Partner Violence: Not on file     Outpatient Medications Prior to Visit  Medication Sig Dispense Refill   acetaminophen (TYLENOL) 650 MG CR tablet Take 650 mg by mouth every 8 (eight) hours as needed for pain.     albuterol (VENTOLIN HFA) 108 (90 Base) MCG/ACT inhaler Inhale 2 puffs into the lungs every 6 (six) hours as needed for wheezing or shortness of breath. 8 g 2   Alum & Mag Hydroxide-Simeth (ANTACID ANTI-GAS PO) Take 1 tablet by mouth daily as needed (for gas).     Ascorbic Acid (VITAMIN C) 1000 MG tablet Take 1,000 mg by mouth 2 (two) times daily.     aspirin EC 81 MG tablet Take 81 mg by mouth daily. Swallow whole.     aspirin-acetaminophen-caffeine (EXCEDRIN MIGRAINE) 250-250-65 MG tablet Take 2 tablets by mouth 2 (two) times daily as needed for headache or migraine.     b complex vitamins tablet Take 1 tablet by mouth daily.     bismuth subsalicylate (PEPTO-BISMOL) 262 MG/15ML suspension Take 30 mLs by mouth 4 (four) times daily -  before meals and at bedtime. 360 mL 0    brompheniramine-pseudoephedrine-DM 30-2-10 MG/5ML syrup Take 5-10 mLs by mouth every 6 (six) hours as needed. 120 mL 0   budesonide-formoterol (SYMBICORT) 80-4.5 MCG/ACT inhaler Inhale 2 puffs into the lungs 2 (two) times daily. 1 each 3   Calcium Carbonate-Vitamin D (CALCIUM-D PO) Take 1,200 mg by mouth 2 (two) times daily.     cyclobenzaprine (FLEXERIL) 10 MG tablet Take 1 tablet by mouth three times daily as needed for muscle spasm 30 tablet  0   doxycycline (VIBRA-TABS) 100 MG tablet Take 1 tablet (100 mg total) by mouth 2 (two) times daily. 14 tablet 0   ECHINACEA-GOLDEN SEAL PO Take 1 tablet by mouth 2 (two) times daily as needed (immune system boost).     EPINEPHrine 0.3 mg/0.3 mL IJ SOAJ injection INJECT 0.3ML INTO THE MUSCLE ONCE AS DIRECTED 2 each 0   furosemide (LASIX) 20 MG tablet Take 1 tablet (20 mg total) by mouth daily. 30 tablet 3   hydrochlorothiazide (HYDRODIURIL) 25 MG tablet Take 25 mg by mouth daily.     hydrOXYzine (VISTARIL) 25 MG capsule Take 1 capsule (25 mg total) by mouth every 8 (eight) hours as needed. 90 capsule 2   lisinopril (ZESTRIL) 10 MG tablet Take 1 tablet (10 mg total) by mouth daily. 90 tablet 0   Magnesium 500 MG TABS Take 500 mg by mouth at bedtime. Dosage varies     Melatonin 10 MG TABS Take 1 tablet by mouth at bedtime.     Multiple Vitamin (MULTIVITAMIN WITH MINERALS) TABS tablet Take 1 tablet by mouth daily.     Multiple Vitamins-Minerals (ZINC PO) Take 500 mg by mouth daily as needed (immune system boost).     nystatin (MYCOSTATIN/NYSTOP) powder Apply 1 Application topically 3 (three) times daily. 60 g 1   ondansetron (ZOFRAN) 4 MG tablet TAKE 1 TABLET BY MOUTH EVERY 8 HOURS AS NEEDED FOR NAUSEA FOR VOMITING 30 tablet 0   predniSONE (DELTASONE) 20 MG tablet Take 2 tablets (40 mg total) by mouth daily. 10 tablet 0   rosuvastatin (CRESTOR) 5 MG tablet Take 1 tablet (5 mg total) by mouth at bedtime. 90 tablet 0   sertraline (ZOLOFT) 100 MG tablet Take 1  tablet (100 mg total) by mouth daily. 90 tablet 2   traZODone (DESYREL) 100 MG tablet Take 1 tablet (100 mg total) by mouth at bedtime. 90 tablet 1   Ubrogepant (UBRELVY) 50 MG TABS Take 50-100mg  at headache onset. Can repeat after 2 hours if needed. Do not exceed 200mg  in 24 hours. 30 tablet 0   Vitamin D, Ergocalciferol, (DRISDOL) 1.25 MG (50000 UNIT) CAPS capsule 1 po q Wed and 1 po q Sun 8 capsule 0   vitamin k 100 MCG tablet Take 100 mcg by mouth daily.     No facility-administered medications prior to visit.    Allergies  Allergen Reactions   Benzalkonium Chloride Anaphylaxis and Hives   Neosporin [Neomycin-Bacitracin Zn-Polymyx] Anaphylaxis and Hives   Polysporin [Bacitracin-Polymyxin B] Hives   Bee Venom Hives and Swelling    Throat swelling   Bupropion Hcl Other (See Comments)    angry   Cephalexin Hives and Swelling   Latex     ROS Review of Systems Negative unless indicated in HPI.    Objective:    Physical Exam Constitutional:      Appearance: Normal appearance.  HENT:     Right Ear: Tympanic membrane normal. Tympanic membrane is not erythematous.     Left Ear: Tympanic membrane normal. Tympanic membrane is not erythematous.     Nose:     Right Turbinates: Not enlarged.     Left Turbinates: Not enlarged.     Right Sinus: No maxillary sinus tenderness or frontal sinus tenderness.     Left Sinus: No maxillary sinus tenderness or frontal sinus tenderness.     Mouth/Throat:     Mouth: Mucous membranes are moist.     Pharynx: No pharyngeal swelling, oropharyngeal exudate or posterior  oropharyngeal erythema.     Tonsils: No tonsillar exudate.  Cardiovascular:     Rate and Rhythm: Normal rate and regular rhythm.  Pulmonary:     Effort: Pulmonary effort is normal.     Breath sounds: No stridor. Wheezing present.  Musculoskeletal:        General: Normal range of motion.  Neurological:     General: No focal deficit present.     Mental Status: She is alert and  oriented to person, place, and time. Mental status is at baseline.  Psychiatric:        Mood and Affect: Mood normal.        Behavior: Behavior normal.        Thought Content: Thought content normal.        Judgment: Judgment normal.     BP 134/86   Pulse 81   Temp 97.8 F (36.6 C)   Ht 5\' 7"  (1.702 m)   Wt (!) 364 lb (165.1 kg)   LMP 07/11/2016   SpO2 97%   BMI 57.01 kg/m  Wt Readings from Last 3 Encounters:  03/11/23 (!) 364 lb (165.1 kg)  03/05/23 (!) 360 lb 12.8 oz (163.7 kg)  02/25/23 (!) 361 lb (163.7 kg)     Health Maintenance  Topic Date Due   Hepatitis C Screening  Never done   Zoster Vaccines- Shingrix (1 of 2) Never done   COVID-19 Vaccine (1 - 2023-24 season) 03/27/2023 (Originally 02/09/2023)   MAMMOGRAM  09/05/2024   DTaP/Tdap/Td (2 - Td or Tdap) 06/25/2027   Cervical Cancer Screening (HPV/Pap Cotest)  09/02/2027   Colonoscopy  09/08/2032   HIV Screening  Completed   HPV VACCINES  Aged Out   INFLUENZA VACCINE  Discontinued    There are no preventive care reminders to display for this patient.  Lab Results  Component Value Date   TSH 3.200 02/11/2023   Lab Results  Component Value Date   WBC CANCELED 02/11/2023   HGB CANCELED 02/11/2023   HCT CANCELED 02/11/2023   MCV 93.6 12/19/2022   PLT CANCELED 02/11/2023   Lab Results  Component Value Date   NA 141 02/11/2023   K 4.5 02/11/2023   CO2 24 02/11/2023   GLUCOSE 109 (H) 02/11/2023   BUN 10 02/11/2023   CREATININE 0.82 02/11/2023   BILITOT 1.0 02/11/2023   ALKPHOS 52 02/11/2023   AST 26 02/11/2023   ALT 25 02/11/2023   PROT 7.0 02/11/2023   ALBUMIN 4.4 02/11/2023   CALCIUM 9.4 02/11/2023   ANIONGAP 10 08/02/2017   EGFR 86 02/11/2023   GFR 68.95 12/19/2022   Lab Results  Component Value Date   CHOL 245 (H) 02/11/2023   Lab Results  Component Value Date   HDL 50 02/11/2023   Lab Results  Component Value Date   LDLCALC 170 (H) 02/11/2023   Lab Results  Component Value Date    TRIG 138 02/11/2023   Lab Results  Component Value Date   CHOLHDL 5 07/18/2022   Lab Results  Component Value Date   HGBA1C 5.7 (H) 02/11/2023      Assessment & Plan:  Respiratory illness Assessment & Plan: Slight expiratory wheezing.  Will treat with Augmentin.  Checks chest x-ray revealed no sign of pneumonia, lung inflammation or fluid buildup.Advised to increase fluid intake and rest.  Continue Bromfed, Tylenol/ibuprofen for fever/pain control. Return to work paperwork completed.  Orders: -     DG Chest 2 View; Future  Wheezing Assessment & Plan:  Slight expiratory wheezing.  Advised patient to take albuterol inhaler every 4-6 hours as needed. Chest x-ray with no sign of pneumonia, lung inflammation or fluid buildup.  Orders: -     DG Chest 2 View; Future  Other orders -     Amoxicillin-Pot Clavulanate; Take 1 tablet by mouth 2 (two) times daily.  Dispense: 20 tablet; Refill: 0    Follow-up: Return if symptoms worsen or fail to improve.   Kara Dies, NP

## 2023-03-12 ENCOUNTER — Ambulatory Visit: Admit: 2023-03-12 | Payer: BC Managed Care – PPO

## 2023-03-12 ENCOUNTER — Ambulatory Visit
Admission: RE | Admit: 2023-03-12 | Discharge: 2023-03-12 | Disposition: A | Discharge status: Home / Self Care | Payer: BC Managed Care – PPO | Source: Ambulatory Visit | Attending: Nurse Practitioner | Admitting: *Deleted

## 2023-03-12 ENCOUNTER — Ambulatory Visit
Admission: RE | Admit: 2023-03-12 | Discharge: 2023-03-12 | Disposition: A | Discharge status: Home / Self Care | Payer: BC Managed Care – PPO | Attending: Nurse Practitioner | Admitting: Nurse Practitioner

## 2023-03-12 DIAGNOSIS — R062 Wheezing: Secondary | ICD-10-CM | POA: Diagnosis not present

## 2023-03-12 DIAGNOSIS — J989 Respiratory disorder, unspecified: Secondary | ICD-10-CM | POA: Insufficient documentation

## 2023-03-13 ENCOUNTER — Encounter: Payer: Self-pay | Admitting: Nurse Practitioner

## 2023-03-13 NOTE — Assessment & Plan Note (Addendum)
Slight expiratory wheezing.  Advised patient to take albuterol inhaler every 4-6 hours as needed. Chest x-ray with no sign of pneumonia, lung inflammation or fluid buildup.

## 2023-03-13 NOTE — Assessment & Plan Note (Addendum)
Slight expiratory wheezing.  Will treat with Augmentin.  Checks chest x-ray revealed no sign of pneumonia, lung inflammation or fluid buildup.Advised to increase fluid intake and rest.  Continue Bromfed, Tylenol/ibuprofen for fever/pain control. Return to work paperwork completed.

## 2023-03-18 ENCOUNTER — Encounter (INDEPENDENT_AMBULATORY_CARE_PROVIDER_SITE_OTHER): Payer: Self-pay | Admitting: Family Medicine

## 2023-03-18 ENCOUNTER — Ambulatory Visit (INDEPENDENT_AMBULATORY_CARE_PROVIDER_SITE_OTHER): Payer: BC Managed Care – PPO | Admitting: Family Medicine

## 2023-03-18 VITALS — BP 108/62 | HR 88 | Temp 97.8°F | Ht 67.0 in | Wt 358.0 lb

## 2023-03-18 DIAGNOSIS — E669 Obesity, unspecified: Secondary | ICD-10-CM | POA: Diagnosis not present

## 2023-03-18 DIAGNOSIS — E7849 Other hyperlipidemia: Secondary | ICD-10-CM | POA: Diagnosis not present

## 2023-03-18 DIAGNOSIS — Z6841 Body Mass Index (BMI) 40.0 and over, adult: Secondary | ICD-10-CM

## 2023-03-18 DIAGNOSIS — R7303 Prediabetes: Secondary | ICD-10-CM

## 2023-03-18 NOTE — Progress Notes (Signed)
Chief Complaint:   OBESITY Danielle Irwin is here to discuss her progress with her obesity treatment plan along with follow-up of her obesity related diagnoses. Danielle Irwin is on the Category 3 Plan and states she is following her eating plan approximately 70% of the time. Danielle Irwin states she is walking for 5 minutes 7 times per week.  Today's visit was #: 3 Starting weight: 364 lbs Starting date: 02/11/2023 Today's weight: 358 lbs Today's date: 03/18/2023 Total lbs lost to date: 6 Total lbs lost since last in-office visit: 3  Interim History: Patient presents for follow up after 3 weeks.  She had an URI and was in bed for about 2 weeks and was on 2 rounds of antibiotics.  She finally went back to work yesterday.  She has been able to do some switching in terms of carbohydrates and working on getting in more protein and vegetables. Over the next few weeks she has no planned travel or events/ activities.   Patient voices she doesn't feel any changes need to be made to the meal plan except slowly decrease total amount of carbohydrates.  Subjective:   1. Other hyperlipidemia Patient is on Crestor daily, and she started after her most recent labs.  She denies transaminitis or myalgias.  2. Prediabetes Patient's last A1c was 5.7 and insulin 21.9.  She is not on medications.  Assessment/Plan:   1. Other hyperlipidemia Patient will continue Crestor with no change in treatment.  2. Prediabetes Patient will continue her Category 3 plan, and she was encouraged to eat her protein first.  3. BMI 50.0-59.9, adult (HCC)  4. Obesity with starting BMI of 58.1 Danielle Irwin is currently in the action stage of change. As such, her goal is to continue with weight loss efforts. She has agreed to the Category 3 Plan.   Exercise goals: All adults should avoid inactivity. Some physical activity is better than none, and adults who participate in any amount of physical activity gain some health benefits.  Behavioral  modification strategies: increasing lean protein intake, meal planning and cooking strategies, keeping healthy foods in the home, and planning for success.  Danielle Irwin has agreed to follow-up with our clinic in 2 to 3 weeks. She was informed of the importance of frequent follow-up visits to maximize her success with intensive lifestyle modifications for her multiple health conditions.   Objective:   Blood pressure 108/62, pulse 88, temperature 97.8 F (36.6 C), height 5\' 7"  (1.702 m), weight (!) 358 lb (162.4 kg), last menstrual period 07/11/2016, SpO2 97%. Body mass index is 56.07 kg/m.  General: Cooperative, alert, well developed, in no acute distress. HEENT: Conjunctivae and lids unremarkable. Cardiovascular: Regular rhythm.  Lungs: Normal work of breathing. Neurologic: No focal deficits.   Lab Results  Component Value Date   CREATININE 0.82 02/11/2023   BUN 10 02/11/2023   NA 141 02/11/2023   K 4.5 02/11/2023   CL 103 02/11/2023   CO2 24 02/11/2023   Lab Results  Component Value Date   ALT 25 02/11/2023   AST 26 02/11/2023   ALKPHOS 52 02/11/2023   BILITOT 1.0 02/11/2023   Lab Results  Component Value Date   HGBA1C 5.7 (H) 02/11/2023   HGBA1C 6.0 07/18/2022   HGBA1C 5.7 12/24/2016   HGBA1C 5.3 10/26/2014   Lab Results  Component Value Date   INSULIN 21.9 02/11/2023   Lab Results  Component Value Date   TSH 3.200 02/11/2023   Lab Results  Component Value Date  CHOL 245 (H) 02/11/2023   HDL 50 02/11/2023   LDLCALC 170 (H) 02/11/2023   LDLDIRECT 129.8 08/21/2010   TRIG 138 02/11/2023   CHOLHDL 5 07/18/2022   Lab Results  Component Value Date   VD25OH 29.8 (L) 02/11/2023   VD25OH 17.54 (L) 07/18/2022   VD25OH 26.79 (L) 05/12/2018   Lab Results  Component Value Date   WBC CANCELED 02/11/2023   HGB CANCELED 02/11/2023   HCT CANCELED 02/11/2023   MCV 93.6 12/19/2022   PLT CANCELED 02/11/2023   Lab Results  Component Value Date   IRON 38 08/02/2017    TIBC 279 08/02/2017   FERRITIN 60 08/02/2017   Attestation Statements:   Reviewed by clinician on day of visit: allergies, medications, problem list, medical history, surgical history, family history, social history, and previous encounter notes.  Time spent on visit including pre-visit chart review and post-visit care and charting was 30 minutes.   I, Burt Knack, am acting as transcriptionist for Reuben Likes, MD.  I have reviewed the above documentation for accuracy and completeness, and I agree with the above. - Reuben Likes, MD

## 2023-03-27 ENCOUNTER — Ambulatory Visit: Payer: BC Managed Care – PPO | Admitting: Nurse Practitioner

## 2023-03-27 NOTE — Progress Notes (Signed)
Bethanie Dicker, NP-C Phone: [phone]  Danielle Irwin is a 52 y.o. female who presents today for blood pressure concerns.   Patient concerned regarding blood pressure instability. She notes an episode on 03/16/2026 where she became dizzy and lightheaded at home after getting up from her bed. She felt that she was going to pass out, became very nauseous and vomited. Her symptoms resolved after several minutes of rest and sitting down. She was evaluated the next day and advised to begin monitoring her blood pressure. She has since been checking her blood pressure at home using a wrist cuff and getting wide fluctuations in her blood pressure- 98-200/40-90. She has continued to have episodes of dizziness with both high and low blood pressures. She is scheduled to see Cardiology in December. She does admit to not drinking much fluids during the day. Denies chest pain. Denies shortness of breath. Denies palpitations.   BP Readings from Last 10 Encounters:  03/28/23 (!) 100/56  03/18/23 108/62  03/11/23 134/86  03/05/23 128/72  02/25/23 111/67  02/11/23 128/76  01/30/23 102/70  01/10/23 112/72  01/09/23 135/65  01/03/23 130/74   Social History   Tobacco Use  Smoking Status Former   Current packs/day: 0.00   Types: Cigarettes   Quit date: 05/26/1990   Years since quitting: 32.8  Smokeless Tobacco Never    Current Outpatient Medications on File Prior to Visit  Medication Sig Dispense Refill   acetaminophen (TYLENOL) 650 MG CR tablet Take 650 mg by mouth every 8 (eight) hours as needed for pain.     albuterol (VENTOLIN HFA) 108 (90 Base) MCG/ACT inhaler Inhale 2 puffs into the lungs every 6 (six) hours as needed for wheezing or shortness of breath. 8 g 2   Alum & Mag Hydroxide-Simeth (ANTACID ANTI-GAS PO) Take 1 tablet by mouth daily as needed (for gas).     Ascorbic Acid (VITAMIN C) 1000 MG tablet Take 1,000 mg by mouth 2 (two) times daily.     aspirin EC 81 MG tablet Take 81 mg by mouth  daily. Swallow whole.     aspirin-acetaminophen-caffeine (EXCEDRIN MIGRAINE) 250-250-65 MG tablet Take 2 tablets by mouth 2 (two) times daily as needed for headache or migraine.     b complex vitamins tablet Take 1 tablet by mouth daily.     bismuth subsalicylate (PEPTO-BISMOL) 262 MG/15ML suspension Take 30 mLs by mouth 4 (four) times daily -  before meals and at bedtime. 360 mL 0   budesonide-formoterol (SYMBICORT) 80-4.5 MCG/ACT inhaler Inhale 2 puffs into the lungs 2 (two) times daily. 1 each 3   Calcium Carbonate-Vitamin D (CALCIUM-D PO) Take 1,200 mg by mouth 2 (two) times daily.     cyclobenzaprine (FLEXERIL) 10 MG tablet Take 1 tablet by mouth three times daily as needed for muscle spasm 30 tablet 0   ECHINACEA-GOLDEN SEAL PO Take 1 tablet by mouth 2 (two) times daily as needed (immune system boost).     EPINEPHrine 0.3 mg/0.3 mL IJ SOAJ injection INJECT 0.3ML INTO THE MUSCLE ONCE AS DIRECTED 2 each 0   furosemide (LASIX) 20 MG tablet Take 1 tablet (20 mg total) by mouth daily. 30 tablet 3   hydrochlorothiazide (HYDRODIURIL) 25 MG tablet Take 25 mg by mouth daily.     hydrOXYzine (VISTARIL) 25 MG capsule Take 1 capsule (25 mg total) by mouth every 8 (eight) hours as needed. 90 capsule 2   lisinopril (ZESTRIL) 10 MG tablet Take 1 tablet (10 mg total) by mouth daily.  90 tablet 0   Magnesium 250 MG TABS Take 500 mg by mouth at bedtime. Dosage varies     Melatonin 10 MG TABS Take 1 tablet by mouth at bedtime.     Multiple Vitamin (MULTIVITAMIN WITH MINERALS) TABS tablet Take 1 tablet by mouth daily. With Elderberry     Multiple Vitamins-Minerals (ZINC PO) Take 500 mg by mouth daily as needed (immune system boost).     nystatin (MYCOSTATIN/NYSTOP) powder Apply 1 Application topically 3 (three) times daily. 60 g 1   ondansetron (ZOFRAN) 4 MG tablet TAKE 1 TABLET BY MOUTH EVERY 8 HOURS AS NEEDED FOR NAUSEA FOR VOMITING 30 tablet 0   rosuvastatin (CRESTOR) 5 MG tablet Take 1 tablet (5 mg total) by  mouth at bedtime. 90 tablet 0   sertraline (ZOLOFT) 100 MG tablet Take 1 tablet (100 mg total) by mouth daily. 90 tablet 2   traZODone (DESYREL) 100 MG tablet Take 1 tablet (100 mg total) by mouth at bedtime. 90 tablet 1   Ubrogepant (UBRELVY) 50 MG TABS Take 50-100mg  at headache onset. Can repeat after 2 hours if needed. Do not exceed 200mg  in 24 hours. 30 tablet 0   Vitamin D, Ergocalciferol, (DRISDOL) 1.25 MG (50000 UNIT) CAPS capsule 1 po q Wed and 1 po q Sun 8 capsule 0   vitamin k 100 MCG tablet Take 100 mcg by mouth daily.     No current facility-administered medications on file prior to visit.    ROS see history of present illness  Objective  Physical Exam Vitals:   03/28/23 1110  BP: (!) 100/56  Pulse: 90  Temp: 98 F (36.7 C)  SpO2: 96%    BP Readings from Last 3 Encounters:  03/28/23 (!) 100/56  03/18/23 108/62  03/11/23 134/86   Wt Readings from Last 3 Encounters:  03/28/23 (!) 361 lb 6.4 oz (163.9 kg)  03/18/23 (!) 358 lb (162.4 kg)  03/11/23 (!) 364 lb (165.1 kg)    Physical Exam Constitutional:      General: She is not in acute distress.    Appearance: Normal appearance.  HENT:     Head: Normocephalic.  Cardiovascular:     Rate and Rhythm: Normal rate and regular rhythm.     Heart sounds: Murmur (systolic- chronic) heard.  Pulmonary:     Effort: Pulmonary effort is normal.     Breath sounds: Normal breath sounds.  Skin:    General: Skin is warm and dry.  Neurological:     General: No focal deficit present.     Mental Status: She is alert.  Psychiatric:        Mood and Affect: Mood normal.        Behavior: Behavior normal.    Assessment/Plan: Please see individual problem list.  Fluctuating blood pressure Assessment & Plan: BP soft today in office. Do not feel that home blood pressure readings are reliable given she has been using a wrist cuff. She will get a new blood pressure cuff to use at home. She will continue checking blood pressure  regularly at home, BP log provided to patient. Counseled on proper way to check blood pressure. She will contact if her readings are staying consistently elevated or low. She will continue her current medication regimen- Lisinopril and Hydrochlorothiazide. EKG today in office reassuring, without acute findings, compared to prior in chart. Orthostatics obtained and within normal limits. Advised adequate fluid intake. She will follow up in 4 weeks to review home blood pressure readings,  sooner if needed. Will consider medication changes if warranted pending home blood pressure readings.  Orders: -     EKG 12-Lead  Lightheadedness Assessment & Plan: Likely associated with lower blood pressure readings. See above plan for fluctuating blood pressure. Lab work as outlined. Advised slow transitions when going from sitting and laying to standing. Encouraged adequate fluid intake. Will consider medication changes pending home blood pressure readings. She will follow up in  4 weeks, sooner if needed. Follow up with Cardiology as scheduled. Return precautions given to patient.  Orders: -     EKG 12-Lead -     Comprehensive metabolic panel -     CBC with Differential/Platelet -     TSH     Return in about 4 weeks (around 04/25/2023) for Follow up.   Bethanie Dicker, NP-C [facility] - [employer]

## 2023-03-28 ENCOUNTER — Ambulatory Visit (INDEPENDENT_AMBULATORY_CARE_PROVIDER_SITE_OTHER): Payer: BC Managed Care – PPO | Admitting: Nurse Practitioner

## 2023-03-28 ENCOUNTER — Encounter: Payer: Self-pay | Admitting: Nurse Practitioner

## 2023-03-28 VITALS — BP 100/56 | HR 90 | Temp 98.0°F | Ht 67.0 in | Wt 361.4 lb

## 2023-03-28 DIAGNOSIS — R42 Dizziness and giddiness: Secondary | ICD-10-CM | POA: Diagnosis not present

## 2023-03-28 DIAGNOSIS — I998 Other disorder of circulatory system: Secondary | ICD-10-CM | POA: Diagnosis not present

## 2023-03-28 LAB — COMPREHENSIVE METABOLIC PANEL
ALT: 27 U/L (ref 0–35)
AST: 27 U/L (ref 0–37)
Albumin: 4.3 g/dL (ref 3.5–5.2)
Alkaline Phosphatase: 40 U/L (ref 39–117)
BUN: 14 mg/dL (ref 6–23)
CO2: 27 meq/L (ref 19–32)
Calcium: 9.4 mg/dL (ref 8.4–10.5)
Chloride: 103 meq/L (ref 96–112)
Creatinine, Ser: 0.81 mg/dL (ref 0.40–1.20)
GFR: 83.33 mL/min (ref >60.00)
Glucose, Bld: 137 mg/dL — ABNORMAL HIGH (ref 70–99)
Potassium: 4 meq/L (ref 3.5–5.1)
Sodium: 139 meq/L (ref 135–145)
Total Bilirubin: 0.8 mg/dL (ref 0.2–1.2)
Total Protein: 7.4 g/dL (ref 6.0–8.3)

## 2023-03-28 LAB — CBC WITH DIFFERENTIAL/PLATELET
Basophils Absolute: 0 10*3/uL (ref 0.0–0.1)
Basophils Relative: 0.7 % (ref 0.0–3.0)
Eosinophils Absolute: 0.1 10*3/uL (ref 0.0–0.7)
Eosinophils Relative: 1.8 % (ref 0.0–5.0)
HCT: 43.1 % (ref 36.0–46.0)
Hemoglobin: 14.2 g/dL (ref 12.0–15.0)
Lymphocytes Relative: 36.6 % (ref 12.0–46.0)
Lymphs Abs: 2.3 10*3/uL (ref 0.7–4.0)
MCHC: 32.9 g/dL (ref 30.0–36.0)
MCV: 94.4 fL (ref 78.0–100.0)
Monocytes Absolute: 0.4 10*3/uL (ref 0.1–1.0)
Monocytes Relative: 6 % (ref 3.0–12.0)
Neutro Abs: 3.5 10*3/uL (ref 1.4–7.7)
Neutrophils Relative %: 54.9 % (ref 43.0–77.0)
Platelets: 275 10*3/uL (ref 150.0–400.0)
RBC: 4.57 Mil/uL (ref 3.87–5.11)
RDW: 12.9 % (ref 11.5–15.5)
WBC: 6.3 10*3/uL (ref 4.0–10.5)

## 2023-03-28 LAB — TSH: TSH: 2.85 u[IU]/mL (ref 0.35–5.50)

## 2023-03-28 NOTE — Assessment & Plan Note (Addendum)
Likely associated with lower blood pressure readings. See above plan for fluctuating blood pressure. Lab work as outlined. Advised slow transitions when going from sitting and laying to standing. Encouraged adequate fluid intake. Will consider medication changes pending home blood pressure readings. She will follow up in  4 weeks, sooner if needed. Follow up with Cardiology as scheduled. Return precautions given to patient.

## 2023-03-28 NOTE — Assessment & Plan Note (Addendum)
BP soft today in office. Do not feel that home blood pressure readings are reliable given she has been using a wrist cuff. She will get a new blood pressure cuff to use at home. She will continue checking blood pressure regularly at home, BP log provided to patient. Counseled on proper way to check blood pressure. She will contact if her readings are staying consistently elevated or low. She will continue her current medication regimen- Lisinopril and Hydrochlorothiazide. EKG today in office reassuring, without acute findings, compared to prior in chart. Orthostatics obtained and within normal limits. Advised adequate fluid intake. She will follow up in 4 weeks to review home blood pressure readings, sooner if needed. Will consider medication changes if warranted pending home blood pressure readings.

## 2023-03-31 ENCOUNTER — Other Ambulatory Visit: Payer: Self-pay | Admitting: Nurse Practitioner

## 2023-04-08 ENCOUNTER — Ambulatory Visit: Payer: BC Managed Care – PPO | Admitting: Nurse Practitioner

## 2023-04-08 ENCOUNTER — Encounter (INDEPENDENT_AMBULATORY_CARE_PROVIDER_SITE_OTHER): Payer: Self-pay | Admitting: Family Medicine

## 2023-04-08 ENCOUNTER — Ambulatory Visit (INDEPENDENT_AMBULATORY_CARE_PROVIDER_SITE_OTHER): Payer: BC Managed Care – PPO | Admitting: Family Medicine

## 2023-04-08 VITALS — BP 128/78 | HR 75 | Temp 97.7°F | Ht 67.0 in | Wt 366.4 lb

## 2023-04-08 VITALS — BP 139/77 | HR 85 | Temp 98.1°F | Ht 67.0 in | Wt 363.0 lb

## 2023-04-08 DIAGNOSIS — S39012S Strain of muscle, fascia and tendon of lower back, sequela: Secondary | ICD-10-CM | POA: Diagnosis not present

## 2023-04-08 DIAGNOSIS — I998 Other disorder of circulatory system: Secondary | ICD-10-CM

## 2023-04-08 DIAGNOSIS — E669 Obesity, unspecified: Secondary | ICD-10-CM

## 2023-04-08 DIAGNOSIS — E559 Vitamin D deficiency, unspecified: Secondary | ICD-10-CM | POA: Diagnosis not present

## 2023-04-08 DIAGNOSIS — G43001 Migraine without aura, not intractable, with status migrainosus: Secondary | ICD-10-CM | POA: Insufficient documentation

## 2023-04-08 DIAGNOSIS — R7303 Prediabetes: Secondary | ICD-10-CM | POA: Diagnosis not present

## 2023-04-08 DIAGNOSIS — G43011 Migraine without aura, intractable, with status migrainosus: Secondary | ICD-10-CM | POA: Diagnosis not present

## 2023-04-08 DIAGNOSIS — Z6841 Body Mass Index (BMI) 40.0 and over, adult: Secondary | ICD-10-CM

## 2023-04-08 MED ORDER — DEXAMETHASONE SODIUM PHOSPHATE 4 MG/ML IJ SOLN
8.0000 mg | Freq: Once | INTRAMUSCULAR | Status: AC
Start: 2023-04-08 — End: 2023-04-08
  Administered 2023-04-08: 8 mg via INTRAMUSCULAR

## 2023-04-08 MED ORDER — VITAMIN D (ERGOCALCIFEROL) 1.25 MG (50000 UNIT) PO CAPS: ORAL_CAPSULE | ORAL | 0 refills | Status: AC

## 2023-04-08 NOTE — Progress Notes (Signed)
Danielle Dicker, NP-C Phone: [phone]  Jamar JAMIELYNN WIGLEY is a 52 y.o. female who presents today for migraine and back pain.   Patient with history of migraines presents today with a migraine lasting for 5 days. She notes that it does ease off some with the use of her Bernita Raisin, then will come right back. It is presenting like her typical migraines. She has associated nausea and light sensitivity. She is scheduled to see Neurology in December. She has had some increased stressors recently as her father is currently in the hospital and her mother had a heart cath yesterday. She is meeting with Palliative Care today to discuss her father. She notes that her father fell over the weekend, resulting in his admission to the hospital. She believes she may have pulled something in her lower back when she had to help pick him up. Denies numbness and tingling in her lower extremities. Denies bowel or bladder incontinence.   Social History   Tobacco Use  Smoking Status Former   Current packs/day: 0.00   Types: Cigarettes   Quit date: 05/26/1990   Years since quitting: 32.9  Smokeless Tobacco Never    Current Outpatient Medications on File Prior to Visit  Medication Sig Dispense Refill   acetaminophen (TYLENOL) 650 MG CR tablet Take 650 mg by mouth every 8 (eight) hours as needed for pain.     albuterol (VENTOLIN HFA) 108 (90 Base) MCG/ACT inhaler Inhale 2 puffs into the lungs every 6 (six) hours as needed for wheezing or shortness of breath. 8 g 2   Alum & Mag Hydroxide-Simeth (ANTACID ANTI-GAS PO) Take 1 tablet by mouth daily as needed (for gas).     Ascorbic Acid (VITAMIN C) 1000 MG tablet Take 1,000 mg by mouth 2 (two) times daily.     aspirin EC 81 MG tablet Take 81 mg by mouth daily. Swallow whole.     aspirin-acetaminophen-caffeine (EXCEDRIN MIGRAINE) 250-250-65 MG tablet Take 2 tablets by mouth 2 (two) times daily as needed for headache or migraine.     b complex vitamins tablet Take 1 tablet by  mouth daily.     bismuth subsalicylate (PEPTO-BISMOL) 262 MG/15ML suspension Take 30 mLs by mouth 4 (four) times daily -  before meals and at bedtime. 360 mL 0   budesonide-formoterol (SYMBICORT) 80-4.5 MCG/ACT inhaler Inhale 2 puffs into the lungs 2 (two) times daily. 1 each 3   Calcium Carbonate-Vitamin D (CALCIUM-D PO) Take 1,200 mg by mouth 2 (two) times daily.     cyclobenzaprine (FLEXERIL) 10 MG tablet Take 1 tablet by mouth three times daily as needed for muscle spasm 30 tablet 0   ECHINACEA-GOLDEN SEAL PO Take 1 tablet by mouth 2 (two) times daily as needed (immune system boost).     EPINEPHrine 0.3 mg/0.3 mL IJ SOAJ injection INJECT 0.3ML INTO THE MUSCLE ONCE AS DIRECTED 2 each 0   furosemide (LASIX) 20 MG tablet Take 1 tablet (20 mg total) by mouth daily. 30 tablet 3   hydrochlorothiazide (HYDRODIURIL) 25 MG tablet Take 25 mg by mouth daily.     hydrOXYzine (VISTARIL) 25 MG capsule Take 1 capsule (25 mg total) by mouth every 8 (eight) hours as needed. 90 capsule 2   lisinopril (ZESTRIL) 10 MG tablet Take 1 tablet (10 mg total) by mouth daily. 90 tablet 0   Magnesium 250 MG TABS Take 500 mg by mouth at bedtime. Dosage varies     Melatonin 10 MG TABS Take 1 tablet by mouth at  bedtime.     Multiple Vitamin (MULTIVITAMIN WITH MINERALS) TABS tablet Take 1 tablet by mouth daily. With Elderberry     Multiple Vitamins-Minerals (ZINC PO) Take 500 mg by mouth daily as needed (immune system boost).     nystatin (MYCOSTATIN/NYSTOP) powder Apply 1 Application topically 3 (three) times daily. 60 g 1   ondansetron (ZOFRAN) 4 MG tablet TAKE 1 TABLET BY MOUTH EVERY 8 HOURS AS NEEDED FOR NAUSEA FOR VOMITING 30 tablet 0   rosuvastatin (CRESTOR) 5 MG tablet Take 1 tablet (5 mg total) by mouth at bedtime. 90 tablet 0   sertraline (ZOLOFT) 100 MG tablet Take 1 tablet (100 mg total) by mouth daily. 90 tablet 2   traZODone (DESYREL) 100 MG tablet Take 1 tablet (100 mg total) by mouth at bedtime. 90 tablet 1    Ubrogepant (UBRELVY) 50 MG TABS Take 50-100mg  at headache onset. Can repeat after 2 hours if needed. Do not exceed 200mg  in 24 hours. 30 tablet 0   Vitamin D, Ergocalciferol, (DRISDOL) 1.25 MG (50000 UNIT) CAPS capsule 1 po q Wed and 1 po q Sun 8 capsule 0   vitamin k 100 MCG tablet Take 100 mcg by mouth daily.     No current facility-administered medications on file prior to visit.    ROS see history of present illness  Objective  Physical Exam Vitals:   04/08/23 0812 04/08/23 0832  BP: (!) 140/70 128/78  Pulse: 75   Temp: 97.7 F (36.5 C)   SpO2: 96%     BP Readings from Last 3 Encounters:  04/10/23 132/84  04/08/23 139/77  04/08/23 128/78   Wt Readings from Last 3 Encounters:  04/10/23 (!) 366 lb 3.2 oz (166.1 kg)  04/08/23 (!) 363 lb (164.7 kg)  04/08/23 (!) 366 lb 6.4 oz (166.2 kg)    Physical Exam Constitutional:      General: She is not in acute distress.    Appearance: Normal appearance.  HENT:     Head: Normocephalic.  Eyes:     Extraocular Movements: Extraocular movements intact.     Conjunctiva/sclera: Conjunctivae normal.     Pupils: Pupils are equal, round, and reactive to light.  Cardiovascular:     Rate and Rhythm: Normal rate and regular rhythm.     Heart sounds: Normal heart sounds.  Pulmonary:     Effort: Pulmonary effort is normal.     Breath sounds: Normal breath sounds.  Musculoskeletal:     Lumbar back: Spasms present. No tenderness. Decreased range of motion (limited by pain).  Skin:    General: Skin is warm and dry.  Neurological:     General: No focal deficit present.     Mental Status: She is alert and oriented to person, place, and time.     Cranial Nerves: No cranial nerve deficit.     Sensory: No sensory deficit.     Motor: No weakness.  Psychiatric:        Mood and Affect: Mood normal.        Behavior: Behavior normal.    Assessment/Plan: Please see individual problem list.  Intractable migraine without aura and with  status migrainosus Assessment & Plan: History of migraines. Will treat with 8 mg Dex. She can continue her Bernita Raisin and Tylenol as needed. Encouraged brain rest, avoiding television, computers and her phone. Advised rest in a dark room. She will follow up with Neuro in December as scheduled. Strict return precautions given to patient.   Orders: -  dexAMETHasone Sodium Phosphate  Strain of lumbar region, sequela Assessment & Plan: From having to pick her father up after a fall. Musculoskeletal in nature. She has Flexeril at home to take as needed. Treating migraine with 8 mg Dexamethasone which I suspect will also help with her back strain. She can taking Tylenol as needed for pain. Advised alternating ice and heat on painful area.   Orders: -     dexAMETHasone Sodium Phosphate   Return if symptoms worsen or fail to improve.   Danielle Dicker, NP-C [facility] - [employer]

## 2023-04-08 NOTE — Progress Notes (Signed)
[name], D.O.  ABFM, ABOM Specializing in Clinical Bariatric Medicine  Office located at: [address] [place], [place]  [address]     Assessment and Plan:   Medications Discontinued During This Encounter  Medication Reason   Vitamin D, Ergocalciferol, (DRISDOL) 1.25 MG (50000 UNIT) CAPS capsule Reorder    Meds ordered this encounter  Medications   Vitamin D, Ergocalciferol, (DRISDOL) 1.25 MG (50000 UNIT) CAPS capsule    Sig: 1 po q Wed and 1 po q Sun    Dispense:  8 capsule    Refill:  0    Prediabetes Assessment: Condition is Not at goal.. This is diet/exercise controlled. Pt endorses having some hunger but doesn't eat much due to stress. She eats about 1 meal a day on busy stressful days.  Lab Results  Component Value Date   HGBA1C 5.7 (H) 02/11/2023   HGBA1C 6.0 07/18/2022   HGBA1C 5.7 12/24/2016   INSULIN 21.9 02/11/2023    Plan: - Continue her prudent nutritional plan that is low in simple carbohydrates, saturated fats and trans fats to goal of 5-10% weight loss to achieve significant health benefits.  Pt encouraged to continually advance exercise and cardiovascular fitness as tolerated throughout weight loss journey.   - Hypoglycemia prevention discussed with the patient, She is to eat on a regular basis and no skipping or going long periods without eating.      - Anticipatory guidance given.    - We will recheck A1c and fasting insulin level in approximately 3 months from last check, or as deemed appropriate.    Vitamin D deficiency Assessment: Condition is Improving, but not optimized.Marland Kitchen Pt vitamin D has been improving since last check. This is being treated with Ergocalciferol 50K lU. She denies any adverse side effects or bone pain on this medication. Lab Results  Component Value Date   VD25OH 29.8 (L) 02/11/2023   VD25OH 17.54 (L) 07/18/2022   VD25OH 26.79 (L) 05/12/2018   Plan: - Continue with Ergocalciferol 50K IU weekly and she was  encouraged to continue to take the medicine until told otherwise.   I will refill today.   - weight loss will likely improve availability of vitamin D, thus encouraged Mane to continue with meal plan and their weight loss efforts to further improve this condition.   Fluctuating blood pressure Assessment: Condition is Not optimized.. Pt informed me that her BP has been extremly high and low randomly. She monitors her BP at home and notices this change too. She is symptomatic and experiences dizziness and syncopy occasionally. She has a follow up with a cardiologist in December.   Plan: - Continue close follow-up with PCP and cardiologist.   - Continue with stress management activities such as exercise, self care, and adequate sleep.    TREATMENT PLAN FOR OBESITY: BMI 50.0-59.9, adult (HCC) Obesity with starting BMI of 58.1 Assessment:  Kindle M Rhodes is here to discuss her progress with her obesity treatment plan along with follow-up of her obesity related diagnoses. See Medical Weight Management Flowsheet for complete bioelectrical impedance results.  Condition is not optimized. Biometric data collected today, was reviewed with patient.   Since last office visit on 03/18/2023 patient's  Muscle mass has decreased by 9lb. Fat mass has increased by 14.4lb. Counseling done on how various foods will affect these numbers and how to maximize success  Total lbs lost to date: 1 Total weight loss percentage to date: 0.27%   Plan:  Floride is  currently in the action stage of change and will continue to follow the Category 3 nutritional plan with breakfast and lunch options.   Behavioral Intervention Additional resources provided today: protein equivalence handout Evidence-based interventions for health behavior change were utilized today including the discussion of self monitoring techniques, problem-solving barriers and SMART goal setting techniques.   Regarding patient's less desirable eating  habits and patterns, we employed the technique of small changes.  Pt will specifically work on: meal prep and plan for next visit.     She has agreed to Think about enjoyable ways to increase daily physical activity and overcoming barriers to exercise, Increase physical activity in their day and reduce sedentary time (increase NEAT)., and Increase the intensity, frequency or duration of aerobic exercises     FOLLOW UP: Return in about 3 weeks (around 04/29/2023).  She was informed of the importance of frequent follow up visits to maximize her success with intensive lifestyle modifications for her multiple health conditions.  Subjective:   Chief complaint: Obesity Ijanae is here to discuss her progress with her obesity treatment plan. She is on the the Category 3 Plan and states she is following her eating plan approximately 50% of the time. She states she is walking 1500-1800 steps.   Interval History:  EVALIN SAGRERO is here for a follow up office visit.     Since last office visit:  Pt has been under situational stress lately. Her dad is now on palliative care, her mom had a heart catheterization and she has been dealing with her own person health/issues. She notes eating under calories and skipping some meals as she has been stressed and busy. She occasionally eats venison burger with a side of veggies. She has noticed that she is able to cut back on the unhealthy food she previously ate a excess of if she does eat off plan.   We reviewed her meal plan and all questions were answered.   Review of Systems:  Pertinent positives were addressed with patient today.  Reviewed by clinician on day of visit: allergies, medications, problem list, medical history, surgical history, family history, social history, and previous encounter notes.  Weight Summary and Biometrics   Weight Lost Since Last Visit: 0lb  Weight Gained Since Last Visit: 6lb   Vitals Temp: 98.1 F (36.7 C) BP:  139/77 Pulse Rate: 85 SpO2: 95 %   Anthropometric Measurements Height: 5\' 7"  (1.702 m) Weight: (!) 363 lb (164.7 kg) BMI (Calculated): 56.84 Weight at Last Visit: 358lb Weight Lost Since Last Visit: 0lb Weight Gained Since Last Visit: 6lb Starting Weight: 364lb Total Weight Loss (lbs): 1 lb (0.454 kg) Peak Weight: 380lb   Body Composition  Body Fat %: 61 % Fat Mass (lbs): 221.6 lbs Muscle Mass (lbs): 134.6 lbs Visceral Fat Rating : 25   Other Clinical Data Fasting: no Labs: no Today's Visit #: 4 Starting Date: 02/11/23     Objective:   PHYSICAL EXAM: Blood pressure 139/77, pulse 85, temperature 98.1 F (36.7 C), height 5\' 7"  (1.702 m), weight (!) 363 lb (164.7 kg), last menstrual period 07/11/2016, SpO2 95%. Body mass index is 56.85 kg/m.  General: Well Developed, well nourished, and in no acute distress.  HEENT: Normocephalic, atraumatic Skin: Warm and dry, cap RF less 2 sec, good turgor Chest:  Normal excursion, shape, no gross abn Respiratory: speaking in full sentences, no conversational dyspnea NeuroM-Sk: Ambulates w/o assistance, moves * 4 Psych: A and O *3, insight good, mood-full  DIAGNOSTIC  DATA REVIEWED:  BMET    Component Value Date/Time   NA 139 03/28/2023 1207   NA 141 02/11/2023 1026   K 4.0 03/28/2023 1207   CL 103 03/28/2023 1207   CO2 27 03/28/2023 1207   GLUCOSE 137 (H) 03/28/2023 1207   BUN 14 03/28/2023 1207   BUN 10 02/11/2023 1026   CREATININE 0.81 03/28/2023 1207   CALCIUM 9.4 03/28/2023 1207   GFRNONAA >60 08/02/2017 0524   GFRAA >60 08/02/2017 0524   Lab Results  Component Value Date   HGBA1C 5.7 (H) 02/11/2023   HGBA1C 5.3 10/26/2014   Lab Results  Component Value Date   INSULIN 21.9 02/11/2023   Lab Results  Component Value Date   TSH 2.85 03/28/2023   CBC    Component Value Date/Time   WBC 6.3 03/28/2023 1207   RBC 4.57 03/28/2023 1207   HGB 14.2 03/28/2023 1207   HGB CANCELED 02/11/2023 1026   HCT 43.1  03/28/2023 1207   HCT CANCELED 02/11/2023 1026   PLT 275.0 03/28/2023 1207   PLT CANCELED 02/11/2023 1026   MCV 94.4 03/28/2023 1207   MCH 31.0 08/03/2017 0342   MCHC 32.9 03/28/2023 1207   RDW 12.9 03/28/2023 1207   Iron Studies    Component Value Date/Time   IRON 38 08/02/2017 0524   TIBC 279 08/02/2017 0524   FERRITIN 60 08/02/2017 0524   IRONPCTSAT 14 08/02/2017 0524   Lipid Panel     Component Value Date/Time   CHOL 245 (H) 02/11/2023 1026   TRIG 138 02/11/2023 1026   HDL 50 02/11/2023 1026   CHOLHDL 5 07/18/2022 0925   VLDL 18.6 07/18/2022 0925   LDLCALC 170 (H) 02/11/2023 1026   LDLDIRECT 129.8 08/21/2010 0911   Hepatic Function Panel     Component Value Date/Time   PROT 7.4 03/28/2023 1207   PROT 7.0 02/11/2023 1026   ALBUMIN 4.3 03/28/2023 1207   ALBUMIN 4.4 02/11/2023 1026   AST 27 03/28/2023 1207   ALT 27 03/28/2023 1207   ALKPHOS 40 03/28/2023 1207   BILITOT 0.8 03/28/2023 1207   BILITOT 1.0 02/11/2023 1026   BILIDIR 0.2 08/21/2010 0911      Component Value Date/Time   TSH 2.85 03/28/2023 1207   Nutritional Lab Results  Component Value Date   VD25OH 29.8 (L) 02/11/2023   VD25OH 17.54 (L) 07/18/2022   VD25OH 26.79 (L) 05/12/2018    Attestations:   I, Clinical biochemist, acting as a Stage manager for [employer], DO., have compiled all relevant documentation for today's office visit on behalf of Thomasene Lot, DO, while in the presence of [employer], DO.  I have reviewed the above documentation for accuracy and completeness, and I agree with the above. [name], D.O.  The 21st Century Cures Act was signed into law in 2016 which includes the topic of electronic health records.  This provides immediate access to information in MyChart.  This includes consultation notes, operative notes, office notes, lab results and pathology reports.  If you have any questions about what you read please let us know at your next visit so we can  discuss your concerns and take corrective action if need be.  We are right here with you.

## 2023-04-08 NOTE — Progress Notes (Signed)
Provider gave orders for pt to have 8 mg of dexamethasone. Pt was informed of the hip injection and verbalized understanding. Pt tolerated injection well in left hip.

## 2023-04-09 ENCOUNTER — Telehealth: Payer: Self-pay

## 2023-04-09 NOTE — Telephone Encounter (Signed)
Called pt to see if she wanted to see pcp as she was seen recently for migraine and states she still has one and she stated she thought she needed to see Danielle Irwin due to paper work, she never brought paperwork in. Pt stated she is okay with seeing her pcp if pcp can fill forms out and has been scheduled with pcp

## 2023-04-10 ENCOUNTER — Ambulatory Visit: Payer: BC Managed Care – PPO | Admitting: Nurse Practitioner

## 2023-04-10 ENCOUNTER — Encounter: Payer: Self-pay | Admitting: Nurse Practitioner

## 2023-04-10 VITALS — BP 132/84 | HR 91 | Temp 97.9°F | Ht 67.0 in | Wt 366.2 lb

## 2023-04-10 DIAGNOSIS — G43011 Migraine without aura, intractable, with status migrainosus: Secondary | ICD-10-CM

## 2023-04-10 MED ORDER — PROPRANOLOL HCL 40 MG PO TABS
40.0000 mg | ORAL_TABLET | Freq: Every day | ORAL | 0 refills | Status: DC
Start: 2023-04-10 — End: 2023-05-06

## 2023-04-10 NOTE — Progress Notes (Signed)
Established Patient Office Visit  Subjective:  Patient ID: Danielle Irwin, female    DOB: [birth year]  Age: 52 y.o. MRN: [id number]  CC:  Chief Complaint  Patient presents with   Headache    Migraine x day 3 Pain 5/10    HPI  Danielle Irwin presents for:  Headache  Associated symptoms include dizziness (some), nausea, phonophobia and photophobia.  Migraine  This is a recurrent problem. The current episode started in the past 7 days. The problem occurs daily. The problem has been waxing and waning. The pain is located in the Occipital and temporal region. The pain radiates to the left neck. The pain quality is similar to prior headaches. The quality of the pain is described as pulsating and aching. Associated symptoms include dizziness (some), nausea, phonophobia and photophobia. The symptoms are aggravated by noise, emotional stress, bright light and hunger. She has tried acetaminophen for the symptoms. The treatment provided mild relief.     Past Medical History:  Diagnosis Date   Anemia    Anxiety    Asthma    Back pain    Depression    Edema of both lower extremities    Epstein Barr infection    Family history of adverse reaction to anesthesia    FATHER WAS SLOW TO WAKE UP   Fibromyalgia    GERD (gastroesophageal reflux disease)    H/O blood clots    Headache    OCCASIONAL MIGRAINES   History of fibromyalgia    HNP (herniated nucleus pulposus), lumbar    Hx of epistaxis    Hypertension    Insomnia    Joint pain    OA (osteoarthritis)    Obesity    PCOS (polycystic ovarian syndrome)    Sensitive skin    Sleep apnea    Vitamin D deficiency     Past Surgical History:  Procedure Laterality Date   BREAST BIOPSY Left 09/17/2022   Stereo bx, Ribbon Clip, path pending   BREAST BIOPSY Left 09/17/2022   MM LT BREAST BX W LOC DEV 1ST LESION IMAGE BX SPEC STEREO GUIDE 09/17/2022 [facility]   COLONOSCOPY WITH PROPOFOL N/A 09/09/2022   Procedure: COLONOSCOPY WITH  PROPOFOL;  Surgeon: Wyline Mood, MD;  Location: [facility] ENDOSCOPY;  Service: Gastroenterology;  Laterality: N/A;   LUMBAR LAMINECTOMY/DECOMPRESSION MICRODISCECTOMY Right 06/02/2014   Procedure: MICRO LUMBAR DECOMPRESSION L4 - L5 ON THE RIGHT 1 LEVEL;  Surgeon: Javier Docker, MD;  Location: [facility] ORS;  Service: Orthopedics;  Laterality: Right;   ORIF HUMERUS FRACTURE Left 08/01/2017   Procedure: OPEN REDUCTION INTERNAL FIXATION (ORIF) PROXIMAL HUMERUS FRACTURE;  Surgeon: Roby Lofts, MD;  Location: [facility] OR;  Service: Orthopedics;  Laterality: Left;   WISDOM TOOTH EXTRACTION      Family History  Problem Relation Age of Onset   Diabetes Mother    High blood pressure Mother    High Cholesterol Mother    Depression Mother    Anxiety disorder Mother    Sleep apnea Mother    Obesity Mother    Heart attack Father 80   High blood pressure Father    High Cholesterol Father    Heart disease Father    Breast cancer Maternal Aunt 58   Ovarian cancer Neg Hx     Social History   Socioeconomic History   Marital status: Single    Spouse name: Not on file   Number of children: Not on file   Years of education: Not on  file   Highest education level: Some college, no degree  Occupational History   Occupation: debt collections  Tobacco Use   Smoking status: Former    Current packs/day: 0.00    Types: Cigarettes    Quit date: 05/26/1990    Years since quitting: 32.9   Smokeless tobacco: Never  Vaping Use   Vaping status: Never Used  Substance and Sexual Activity   Alcohol use: Yes    Comment: occ   Drug use: No   Sexual activity: Not Currently    Partners: Male    Birth control/protection: Post-menopausal  Other Topics Concern   Not on file  Social History Narrative   Not on file   Social Determinants of Health   Financial Resource Strain: Medium Risk (03/26/2023)   Overall Financial Resource Strain (CARDIA)    Difficulty of Paying Living Expenses: Somewhat hard  Food Insecurity:  Food Insecurity Present (03/26/2023)   Hunger Vital Sign    Worried About Running Out of Food in the Last Year: Sometimes true    Ran Out of Food in the Last Year: Sometimes true  Transportation Needs: No Transportation Needs (03/26/2023)   PRAPARE - Administrator, Civil Service (Medical): No    Lack of Transportation (Non-Medical): No  Physical Activity: Unknown (03/26/2023)   Exercise Vital Sign    Days of Exercise per Week: 0 days    Minutes of Exercise per Session: Not on file  Stress: Stress Concern Present (03/26/2023)   [employer] of Occupational Health - Occupational Stress Questionnaire    Feeling of Stress : Rather much  Social Connections: Moderately Isolated (03/26/2023)   Social Connection and Isolation Panel [NHANES]    Frequency of Communication with Friends and Family: Three times a week    Frequency of Social Gatherings with Friends and Family: Never    Attends Religious Services: 1 to 4 times per year    Active Member of [employer] or Organizations: No    Attends Engineer, structural: Not on file    Marital Status: Never married  Intimate Partner Violence: Not on file     Outpatient Medications Prior to Visit  Medication Sig Dispense Refill   acetaminophen (TYLENOL) 650 MG CR tablet Take 650 mg by mouth every 8 (eight) hours as needed for pain.     albuterol (VENTOLIN HFA) 108 (90 Base) MCG/ACT inhaler Inhale 2 puffs into the lungs every 6 (six) hours as needed for wheezing or shortness of breath. 8 g 2   Alum & Mag Hydroxide-Simeth (ANTACID ANTI-GAS PO) Take 1 tablet by mouth daily as needed (for gas).     Ascorbic Acid (VITAMIN C) 1000 MG tablet Take 1,000 mg by mouth 2 (two) times daily.     aspirin EC 81 MG tablet Take 81 mg by mouth daily. Swallow whole.     aspirin-acetaminophen-caffeine (EXCEDRIN MIGRAINE) 250-250-65 MG tablet Take 2 tablets by mouth 2 (two) times daily as needed for headache or migraine.     b complex vitamins tablet  Take 1 tablet by mouth daily.     bismuth subsalicylate (PEPTO-BISMOL) 262 MG/15ML suspension Take 30 mLs by mouth 4 (four) times daily -  before meals and at bedtime. 360 mL 0   budesonide-formoterol (SYMBICORT) 80-4.5 MCG/ACT inhaler Inhale 2 puffs into the lungs 2 (two) times daily. 1 each 3   Calcium Carbonate-Vitamin D (CALCIUM-D PO) Take 1,200 mg by mouth 2 (two) times daily.     cyclobenzaprine (FLEXERIL) 10 MG  tablet Take 1 tablet by mouth three times daily as needed for muscle spasm 30 tablet 0   ECHINACEA-GOLDEN SEAL PO Take 1 tablet by mouth 2 (two) times daily as needed (immune system boost).     EPINEPHrine 0.3 mg/0.3 mL IJ SOAJ injection INJECT 0.3ML INTO THE MUSCLE ONCE AS DIRECTED 2 each 0   furosemide (LASIX) 20 MG tablet Take 1 tablet (20 mg total) by mouth daily. 30 tablet 3   hydrochlorothiazide (HYDRODIURIL) 25 MG tablet Take 25 mg by mouth daily.     hydrOXYzine (VISTARIL) 25 MG capsule Take 1 capsule (25 mg total) by mouth every 8 (eight) hours as needed. 90 capsule 2   lisinopril (ZESTRIL) 10 MG tablet Take 1 tablet (10 mg total) by mouth daily. 90 tablet 0   Magnesium 250 MG TABS Take 500 mg by mouth at bedtime. Dosage varies     Melatonin 10 MG TABS Take 1 tablet by mouth at bedtime.     Multiple Vitamin (MULTIVITAMIN WITH MINERALS) TABS tablet Take 1 tablet by mouth daily. With Elderberry     Multiple Vitamins-Minerals (ZINC PO) Take 500 mg by mouth daily as needed (immune system boost).     nystatin (MYCOSTATIN/NYSTOP) powder Apply 1 Application topically 3 (three) times daily. 60 g 1   ondansetron (ZOFRAN) 4 MG tablet TAKE 1 TABLET BY MOUTH EVERY 8 HOURS AS NEEDED FOR NAUSEA FOR VOMITING 30 tablet 0   rosuvastatin (CRESTOR) 5 MG tablet Take 1 tablet (5 mg total) by mouth at bedtime. 90 tablet 0   sertraline (ZOLOFT) 100 MG tablet Take 1 tablet (100 mg total) by mouth daily. 90 tablet 2   traZODone (DESYREL) 100 MG tablet Take 1 tablet (100 mg total) by mouth at  bedtime. 90 tablet 1   Ubrogepant (UBRELVY) 50 MG TABS Take 50-100mg  at headache onset. Can repeat after 2 hours if needed. Do not exceed 200mg  in 24 hours. 30 tablet 0   Vitamin D, Ergocalciferol, (DRISDOL) 1.25 MG (50000 UNIT) CAPS capsule 1 po q Wed and 1 po q Sun 8 capsule 0   vitamin k 100 MCG tablet Take 100 mcg by mouth daily.     No facility-administered medications prior to visit.    Allergies  Allergen Reactions   Benzalkonium Chloride Anaphylaxis and Hives   Neosporin [Neomycin-Bacitracin Zn-Polymyx] Anaphylaxis and Hives   Polysporin [Bacitracin-Polymyxin B] Hives   Bee Venom Hives and Swelling    Throat swelling   Bupropion Hcl Other (See Comments)    angry   Cephalexin Hives and Swelling   Latex     ROS Review of Systems  Eyes:  Positive for photophobia.  Gastrointestinal:  Positive for nausea.  Neurological:  Positive for dizziness (some) and headaches.   Negative unless indicated in HPI.    Objective:    Physical Exam Constitutional:      Appearance: She is well-developed.  Eyes:     Extraocular Movements:     Right eye: No nystagmus.     Left eye: No nystagmus.     Pupils: Pupils are equal, round, and reactive to light.  Cardiovascular:     Rate and Rhythm: Normal rate and regular rhythm.     Heart sounds: Normal heart sounds.  Pulmonary:     Effort: Pulmonary effort is normal.     Breath sounds: Normal breath sounds.  Skin:    General: Skin is warm.  Neurological:     Mental Status: She is alert and oriented to person,  place, and time. Mental status is at baseline.  Psychiatric:        Mood and Affect: Mood normal.        Speech: Speech normal.        Behavior: Behavior normal.     BP 132/84   Pulse 91   Temp 97.9 F (36.6 C)   Ht 5\' 7"  (1.702 m)   Wt (!) 366 lb 3.2 oz (166.1 kg)   LMP 07/11/2016   SpO2 97%   BMI 57.36 kg/m  Wt Readings from Last 3 Encounters:  04/10/23 (!) 366 lb 3.2 oz (166.1 kg)  04/08/23 (!) 363 lb (164.7 kg)   04/08/23 (!) 366 lb 6.4 oz (166.2 kg)     Health Maintenance  Topic Date Due   Hepatitis C Screening  Never done   Zoster Vaccines- Shingrix (1 of 2) Never done   COVID-19 Vaccine (1 - 2023-24 season) 04/26/2023 (Originally 02/09/2023)   INFLUENZA VACCINE  09/08/2023 (Originally 01/09/2023)   MAMMOGRAM  09/05/2024   DTaP/Tdap/Td (2 - Td or Tdap) 06/25/2027   Cervical Cancer Screening (HPV/Pap Cotest)  09/02/2027   Colonoscopy  09/08/2032   HIV Screening  Completed   HPV VACCINES  Aged Out    There are no preventive care reminders to display for this patient.  Lab Results  Component Value Date   TSH 2.85 03/28/2023   Lab Results  Component Value Date   WBC 6.3 03/28/2023   HGB 14.2 03/28/2023   HCT 43.1 03/28/2023   MCV 94.4 03/28/2023   PLT 275.0 03/28/2023   Lab Results  Component Value Date   NA 139 03/28/2023   K 4.0 03/28/2023   CO2 27 03/28/2023   GLUCOSE 137 (H) 03/28/2023   BUN 14 03/28/2023   CREATININE 0.81 03/28/2023   BILITOT 0.8 03/28/2023   ALKPHOS 40 03/28/2023   AST 27 03/28/2023   ALT 27 03/28/2023   PROT 7.4 03/28/2023   ALBUMIN 4.3 03/28/2023   CALCIUM 9.4 03/28/2023   ANIONGAP 10 08/02/2017   EGFR 86 02/11/2023   GFR 83.33 03/28/2023   Lab Results  Component Value Date   CHOL 245 (H) 02/11/2023   Lab Results  Component Value Date   HDL 50 02/11/2023   Lab Results  Component Value Date   LDLCALC 170 (H) 02/11/2023   Lab Results  Component Value Date   TRIG 138 02/11/2023   Lab Results  Component Value Date   CHOLHDL 5 07/18/2022   Lab Results  Component Value Date   HGBA1C 5.7 (H) 02/11/2023      Assessment & Plan:  Intractable migraine without aura and with status migrainosus Assessment & Plan: Advised patient to continue use Ubrelvy, OTC acetaminophen and NSAIDs. Recommended hydration, rest in a dark and quiet room. Patient had follow-up scheduled with neurology. Discussed with patient to start on propranolol for  preventative treatment of migraine.  Work note provided.  Patient will send a copy of FMLA form when she receives it.    Other orders -     Propranolol HCl; Take 1 tablet (40 mg total) by mouth daily.  Dispense: 30 tablet; Refill: 0    Follow-up: Return if symptoms worsen or fail to improve.   Kara Dies, NP

## 2023-04-16 ENCOUNTER — Encounter: Payer: Self-pay | Admitting: Nurse Practitioner

## 2023-04-16 NOTE — Assessment & Plan Note (Signed)
History of migraines. Will treat with 8 mg Dex. She can continue her Bernita Raisin and Tylenol as needed. Encouraged brain rest, avoiding television, computers and her phone. Advised rest in a dark room. She will follow up with Neuro in December as scheduled. Strict return precautions given to patient.

## 2023-04-16 NOTE — Assessment & Plan Note (Signed)
From having to pick her father up after a fall. Musculoskeletal in nature. She has Flexeril at home to take as needed. Treating migraine with 8 mg Dexamethasone which I suspect will also help with her back strain. She can taking Tylenol as needed for pain. Advised alternating ice and heat on painful area.

## 2023-04-18 NOTE — Assessment & Plan Note (Signed)
Advised patient to continue use Ubrelvy, OTC acetaminophen and NSAIDs. Recommended hydration, rest in a dark and quiet room. Patient had follow-up scheduled with neurology. Discussed with patient to start on propranolol for preventative treatment of migraine.  Work note provided.  Patient will send a copy of FMLA form when she receives it.

## 2023-04-21 ENCOUNTER — Ambulatory Visit: Payer: BC Managed Care – PPO | Admitting: Orthopedic Surgery

## 2023-04-23 ENCOUNTER — Encounter: Payer: Self-pay | Admitting: Primary Care

## 2023-04-23 ENCOUNTER — Ambulatory Visit: Payer: BC Managed Care – PPO | Admitting: Primary Care

## 2023-04-23 VITALS — BP 126/88 | HR 94 | Temp 97.8°F | Ht 67.0 in | Wt 362.0 lb

## 2023-04-23 DIAGNOSIS — G43009 Migraine without aura, not intractable, without status migrainosus: Secondary | ICD-10-CM | POA: Diagnosis not present

## 2023-04-23 DIAGNOSIS — R197 Diarrhea, unspecified: Secondary | ICD-10-CM | POA: Diagnosis not present

## 2023-04-23 MED ORDER — KETOROLAC TROMETHAMINE 60 MG/2ML IM SOLN
60.0000 mg | Freq: Once | INTRAMUSCULAR | Status: AC
  Administered 2023-04-23: 60 mg via INTRAMUSCULAR

## 2023-04-23 NOTE — Addendum Note (Signed)
Addended by: Alvina Chou on: 04/23/2023 02:40 PM   Modules accepted: Orders

## 2023-04-23 NOTE — Patient Instructions (Signed)
Complete and return the stool studies as directed.  Stop by the lab prior to leaving today. I will notify you of your results once received.   You may take 1 tablet of Ubrelvy at migraine onset.  You may take a second tablet in 2 hours if your migraine persists.  Increase your propranolol to 80 mg (2 tablets) daily for headache prevention.  Follow-up with your PCP about this.  It was a pleasure to see you today!

## 2023-04-23 NOTE — Assessment & Plan Note (Signed)
Uncontrolled with new migraine as of 5 days ago.  Recommended to increase propranolol to 80 mg. Also consider changing to ER version for longer lasting duration. She will discuss with PCP  Also discussed that she can take a second tab of Ubrelvy 2 hours after the initial dose of her migraine persists.  She did not know this. Consider increasing the dose of Ubrelvy to 100 mg.  She will also discuss with PCP.  IM Toradol 60 mg provided today.  Work note provided.

## 2023-04-23 NOTE — Progress Notes (Signed)
Subjective:    Patient ID: Danielle Irwin, female    DOB: [birth year], 52 y.o.   MRN: [id number]  Migraine  Associated symptoms include abdominal pain, nausea and photophobia. Pertinent negatives include no vomiting.  Diarrhea  Associated symptoms include abdominal pain and headaches. Pertinent negatives include no vomiting.    Danielle Irwin is a very pleasant 52 y.o. female patient of Charanpreet, NP with a history of migraines, asthma, OSA, morbid obesity, gastroenteritis, fibromyalgia, diarrhea anxiety depression, prediabetes who presents today to discuss multiple concerns.  1) Migraine: Chronic history. Currently managed on Ubrelvy 50 mg as needed and propranolol 40 mg daily for headache/migraine prevention.  Initially evaluated on 04/08/2023 by Bethanie Dicker, NP for a 5-day history of left frontal and temporal migraine.. Treated with Dexamethasone 8 mg in the office. Told to continue Ubrelvy. Symptoms improved, but did not resolve.  She was last evaluated by PCP on 04/10/2023 for ongoing history of left temporal and occipita migraine with radiation to the neck.  Also with dizziness, nausea, photophobia, phonophobia.  During this visit the propranolol was initiated.  A referral was also placed to neurology.  Today she discusses a headache that began 6 days ago. She then developed nausea. Five days ago she developed a full blown migraine to the left temporal and occipital lobe with radiation down to her neck. She is compliant to her propranolol daily for which she thinks helps some in headache reduction. She's been taking Bernita Raisin which helps some, but then migraine returns. She has an appointment with neurology scheduled in December.  She is under a lot of stress as her father was recently placed on hospice. 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 She is needing a work note covering Friday November 8th through Thursday November 14th. She will return on  Friday November 15th.  2) Diarrhea: Symptom onset 3 days ago. Her diarrhea is liquidly, watery, forceful. Two days ago she had 12 episodes, yesterday she had 8 episodes, and today she's had 5 episodes. She's also noticed nausea, bilateral lower abdominal cramping with diarrhea that resolves after her movement.   At work, she's only allowed 2 bathroom breaks and 1 lunch. She needs more than this with her diarrhea.   She denies changes in diet, recent antibiotic use, new medications except for propranolol. She is working with the weight loss clinic in [place] to improve her diet. Denies drastic changes.   She has a history of this type of diarrhea, thinks she may have IBS. She is scheduled to see GI next week.  She is needing a work note covering Friday November 8th through Thursday November 14th. She will return on Friday November 15th.   She underwent colonoscopy in April 2024 which did not reveal inflammatory bowel disease or other abnormalities.   Review of Systems  Eyes:  Positive for photophobia.  Gastrointestinal:  Positive for abdominal pain, diarrhea and nausea. Negative for blood in stool and vomiting.  Neurological:  Positive for headaches.         Past Medical History:  Diagnosis Date   Anemia    Anxiety    Asthma    Back pain    Depression    Edema of both lower extremities    Epstein Barr infection    Family history of adverse reaction to anesthesia    FATHER WAS SLOW TO WAKE UP   Fibromyalgia    GERD (gastroesophageal reflux disease)    H/O  blood clots    Headache    OCCASIONAL MIGRAINES   History of fibromyalgia    HNP (herniated nucleus pulposus), lumbar    Hx of epistaxis    Hypertension    Insomnia    Joint pain    OA (osteoarthritis)    Obesity    PCOS (polycystic ovarian syndrome)    Sensitive skin    Sleep apnea    Vitamin D deficiency     Social History   Socioeconomic History   Marital status: Single    Spouse name: Not on file    Number of children: Not on file   Years of education: Not on file   Highest education level: Some college, no degree  Occupational History   Occupation: debt collections  Tobacco Use   Smoking status: Former    Current packs/day: 0.00    Types: Cigarettes    Quit date: 05/26/1990    Years since quitting: 32.9   Smokeless tobacco: Never  Vaping Use   Vaping status: Never Used  Substance and Sexual Activity   Alcohol use: Yes    Comment: occ   Drug use: No   Sexual activity: Not Currently    Partners: Male    Birth control/protection: Post-menopausal  Other Topics Concern   Not on file  Social History Narrative   Not on file   Social Determinants of Health   Financial Resource Strain: Medium Risk (03/26/2023)   Overall Financial Resource Strain (CARDIA)    Difficulty of Paying Living Expenses: Somewhat hard  Food Insecurity: Food Insecurity Present (03/26/2023)   Hunger Vital Sign    Worried About Running Out of Food in the Last Year: Sometimes true    Ran Out of Food in the Last Year: Sometimes true  Transportation Needs: No Transportation Needs (03/26/2023)   PRAPARE - Administrator, Civil Service (Medical): No    Lack of Transportation (Non-Medical): No  Physical Activity: Unknown (03/26/2023)   Exercise Vital Sign    Days of Exercise per Week: 0 days    Minutes of Exercise per Session: Not on file  Stress: Stress Concern Present (03/26/2023)   [employer] of Occupational Health - Occupational Stress Questionnaire    Feeling of Stress : Rather much  Social Connections: Moderately Isolated (03/26/2023)   Social Connection and Isolation Panel [NHANES]    Frequency of Communication with Friends and Family: Three times a week    Frequency of Social Gatherings with Friends and Family: Never    Attends Religious Services: 1 to 4 times per year    Active Member of Clubs or Organizations: No    Attends Engineer, structural: Not on file     Marital Status: Never married  Intimate Partner Violence: Not on file    Past Surgical History:  Procedure Laterality Date   BREAST BIOPSY Left 09/17/2022   Stereo bx, Ribbon Clip, path pending   BREAST BIOPSY Left 09/17/2022   MM LT BREAST BX W LOC DEV 1ST LESION IMAGE BX SPEC STEREO GUIDE 09/17/2022 [facility]   COLONOSCOPY WITH PROPOFOL N/A 09/09/2022   Procedure: COLONOSCOPY WITH PROPOFOL;  Surgeon: Wyline Mood, MD;  Location: [facility] ENDOSCOPY;  Service: Gastroenterology;  Laterality: N/A;   LUMBAR LAMINECTOMY/DECOMPRESSION MICRODISCECTOMY Right 06/02/2014   Procedure: MICRO LUMBAR DECOMPRESSION L4 - L5 ON THE RIGHT 1 LEVEL;  Surgeon: Javier Docker, MD;  Location: [facility];  Service: Orthopedics;  Laterality: Right;   ORIF HUMERUS FRACTURE Left 08/01/2017   Procedure:  OPEN REDUCTION INTERNAL FIXATION (ORIF) PROXIMAL HUMERUS FRACTURE;  Surgeon: Roby Lofts, MD;  Location: [facility] OR;  Service: Orthopedics;  Laterality: Left;   WISDOM TOOTH EXTRACTION      Family History  Problem Relation Age of Onset   Diabetes Mother    High blood pressure Mother    High Cholesterol Mother    Depression Mother    Anxiety disorder Mother    Sleep apnea Mother    Obesity Mother    Heart attack Father 73   High blood pressure Father    High Cholesterol Father    Heart disease Father    Breast cancer Maternal Aunt 50   Ovarian cancer Neg Hx     Allergies  Allergen Reactions   Benzalkonium Chloride Anaphylaxis and Hives   Neosporin [Neomycin-Bacitracin Zn-Polymyx] Anaphylaxis and Hives   Polysporin [Bacitracin-Polymyxin B] Hives   Bee Venom Hives and Swelling    Throat swelling   Bupropion Hcl Other (See Comments)    angry   Cephalexin Hives and Swelling   Latex     Current Outpatient Medications on File Prior to Visit  Medication Sig Dispense Refill   acetaminophen (TYLENOL) 650 MG CR tablet Take 650 mg by mouth every 8 (eight) hours as needed for pain.     albuterol (VENTOLIN HFA)  108 (90 Base) MCG/ACT inhaler Inhale 2 puffs into the lungs every 6 (six) hours as needed for wheezing or shortness of breath. 8 g 2   Alum & Mag Hydroxide-Simeth (ANTACID ANTI-GAS PO) Take 1 tablet by mouth daily as needed (for gas).     Ascorbic Acid (VITAMIN C) 1000 MG tablet Take 1,000 mg by mouth 2 (two) times daily.     aspirin EC 81 MG tablet Take 81 mg by mouth daily. Swallow whole.     aspirin-acetaminophen-caffeine (EXCEDRIN MIGRAINE) 250-250-65 MG tablet Take 2 tablets by mouth 2 (two) times daily as needed for headache or migraine.     b complex vitamins tablet Take 1 tablet by mouth daily.     bismuth subsalicylate (PEPTO-BISMOL) 262 MG/15ML suspension Take 30 mLs by mouth 4 (four) times daily -  before meals and at bedtime. 360 mL 0   budesonide-formoterol (SYMBICORT) 80-4.5 MCG/ACT inhaler Inhale 2 puffs into the lungs 2 (two) times daily. 1 each 3   Calcium Carbonate-Vitamin D (CALCIUM-D PO) Take 1,200 mg by mouth 2 (two) times daily.     cyclobenzaprine (FLEXERIL) 10 MG tablet Take 1 tablet by mouth three times daily as needed for muscle spasm 30 tablet 0   ECHINACEA-GOLDEN SEAL PO Take 1 tablet by mouth 2 (two) times daily as needed (immune system boost).     EPINEPHrine 0.3 mg/0.3 mL IJ SOAJ injection INJECT 0.3ML INTO THE MUSCLE ONCE AS DIRECTED 2 each 0   furosemide (LASIX) 20 MG tablet Take 1 tablet (20 mg total) by mouth daily. 30 tablet 3   hydrochlorothiazide (HYDRODIURIL) 25 MG tablet Take 25 mg by mouth daily.     hydrOXYzine (VISTARIL) 25 MG capsule Take 1 capsule (25 mg total) by mouth every 8 (eight) hours as needed. 90 capsule 2   lisinopril (ZESTRIL) 10 MG tablet Take 1 tablet (10 mg total) by mouth daily. 90 tablet 0   Magnesium 250 MG TABS Take 500 mg by mouth at bedtime. Dosage varies     Melatonin 10 MG TABS Take 1 tablet by mouth at bedtime.     Multiple Vitamin (MULTIVITAMIN WITH MINERALS) TABS tablet Take 1 tablet  by mouth daily. With Elderberry     Multiple  Vitamins-Minerals (ZINC PO) Take 500 mg by mouth daily as needed (immune system boost).     nystatin (MYCOSTATIN/NYSTOP) powder Apply 1 Application topically 3 (three) times daily. 60 g 1   ondansetron (ZOFRAN) 4 MG tablet TAKE 1 TABLET BY MOUTH EVERY 8 HOURS AS NEEDED FOR NAUSEA FOR VOMITING 30 tablet 0   propranolol (INDERAL) 40 MG tablet Take 1 tablet (40 mg total) by mouth daily. 30 tablet 0   rosuvastatin (CRESTOR) 5 MG tablet Take 1 tablet (5 mg total) by mouth at bedtime. 90 tablet 0   sertraline (ZOLOFT) 100 MG tablet Take 1 tablet (100 mg total) by mouth daily. 90 tablet 2   traZODone (DESYREL) 100 MG tablet Take 1 tablet (100 mg total) by mouth at bedtime. 90 tablet 1   Ubrogepant (UBRELVY) 50 MG TABS Take 50-100mg  at headache onset. Can repeat after 2 hours if needed. Do not exceed 200mg  in 24 hours. 30 tablet 0   Vitamin D, Ergocalciferol, (DRISDOL) 1.25 MG (50000 UNIT) CAPS capsule 1 po q Wed and 1 po q Sun 8 capsule 0   vitamin k 100 MCG tablet Take 100 mcg by mouth daily.     No current facility-administered medications on file prior to visit.    BP 126/88   Pulse 94   Temp 97.8 F (36.6 C) (Temporal)   Ht 5\' 7"  (1.702 m)   Wt (!) 362 lb (164.2 kg)   LMP 07/11/2016   SpO2 98%   BMI 56.70 kg/m  Objective:   Physical Exam Cardiovascular:     Rate and Rhythm: Normal rate and regular rhythm.  Pulmonary:     Effort: Pulmonary effort is normal.     Breath sounds: Normal breath sounds.  Abdominal:     General: Bowel sounds are normal.     Palpations: Abdomen is soft.     Tenderness: There is no abdominal tenderness.  Musculoskeletal:     Cervical back: Neck supple.  Skin:    General: Skin is warm and dry.  Neurological:     Mental Status: She is alert and oriented to person, place, and time.  Psychiatric:        Mood and Affect: Mood normal.           Assessment & Plan:  Diarrhea, unspecified type Assessment & Plan: Acute on chronic flare.  IBS is a  likely diagnosis.  Checking stool studies, alpha gal panel, food allergy panel, CBC with differential today. Will defer further testing to GI as she is scheduled next week.  Reviewed colonoscopy from April 2024.  Work note provided.  Orders: -     Giardia antigen -     Gastrointestinal Pathogen Pnl RT, PCR -     C. difficile GDH and Toxin A/B -     Alpha-Gal Panel -     CBC with Differential/Platelet -     Food Allergy Profile  Migraine without aura and without status migrainosus, not intractable Assessment & Plan: Uncontrolled with new migraine as of 5 days ago.  Recommended to increase propranolol to 80 mg. Also consider changing to ER version for longer lasting duration. She will discuss with PCP  Also discussed that she can take a second tab of Ubrelvy 2 hours after the initial dose of her migraine persists.  She did not know this. Consider increasing the dose of Ubrelvy to 100 mg.  She will also discuss with PCP.  IM Toradol 60 mg provided today.  Work note provided.  Orders: -     Ketorolac Tromethamine        Doreene Nest, NP

## 2023-04-23 NOTE — Assessment & Plan Note (Signed)
Acute on chronic flare.  IBS is a likely diagnosis.  Checking stool studies, alpha gal panel, food allergy panel, CBC with differential today. Will defer further testing to GI as she is scheduled next week.  Reviewed colonoscopy from April 2024.  Work note provided.

## 2023-04-24 LAB — CBC WITH DIFFERENTIAL/PLATELET
Basophils Absolute: 0.1 10*3/uL (ref 0.0–0.1)
Basophils Relative: 1 % (ref 0.0–3.0)
Eosinophils Absolute: 0.1 10*3/uL (ref 0.0–0.7)
Eosinophils Relative: 1.3 % (ref 0.0–5.0)
HCT: 42.4 % (ref 36.0–46.0)
Hemoglobin: 14.1 g/dL (ref 12.0–15.0)
Lymphocytes Relative: 31.9 % (ref 12.0–46.0)
Lymphs Abs: 2.4 10*3/uL (ref 0.7–4.0)
MCHC: 33.3 g/dL (ref 30.0–36.0)
MCV: 94 fL (ref 78.0–100.0)
Monocytes Absolute: 0.5 10*3/uL (ref 0.1–1.0)
Monocytes Relative: 7 % (ref 3.0–12.0)
Neutro Abs: 4.5 10*3/uL (ref 1.4–7.7)
Neutrophils Relative %: 58.8 % (ref 43.0–77.0)
Platelets: 259 10*3/uL (ref 150.0–400.0)
RBC: 4.51 Mil/uL (ref 3.87–5.11)
RDW: 12.1 % (ref 11.5–15.5)
WBC: 7.7 10*3/uL (ref 4.0–10.5)

## 2023-04-25 ENCOUNTER — Telehealth: Payer: Self-pay | Admitting: Nurse Practitioner

## 2023-04-25 ENCOUNTER — Other Ambulatory Visit: Payer: Self-pay

## 2023-04-25 DIAGNOSIS — R197 Diarrhea, unspecified: Secondary | ICD-10-CM

## 2023-04-25 NOTE — Telephone Encounter (Signed)
Patient just dropped off a LOA form for provider to fill out. It's in the colorful folder. Patient will come back and pick up when ready. Her number is [phone].

## 2023-04-27 LAB — FOOD ALLERGY PROFILE
Allergen, Salmon, f41: <0.1 kU/L
Almonds: <0.1 kU/L
CLASS: 0
CLASS: 0
CLASS: 0
CLASS: 0
CLASS: 0
CLASS: 0
CLASS: 0
CLASS: 0
CLASS: 0
CLASS: 0
CLASS: 0
Cashew IgE: <0.1 kU/L
Class: 0
Class: 0
Class: 0
Class: 0
Egg White IgE: <0.1 kU/L
Fish Cod: <0.1 kU/L
Hazelnut: <0.1 kU/L
Milk IgE: <0.1 kU/L
Peanut IgE: <0.1 kU/L
Scallop IgE: <0.1 kU/L
Sesame Seed f10: <0.1 kU/L
Shrimp IgE: <0.1 kU/L
Soybean IgE: <0.1 kU/L
Tuna IgE: <0.1 kU/L
Walnut: <0.1 kU/L
Wheat IgE: <0.1 kU/L

## 2023-04-27 LAB — ALPHA-GAL PANEL
Allergen, Mutton, f88: <0.1 kU/L
Allergen, Pork, f26: <0.1 kU/L
Beef: <0.1 kU/L
CLASS: 0
CLASS: 0
Class: 0
GALACTOSE-ALPHA-1,3-GALACTOSE IGE*: <0.1 kU/L (ref <0.10)

## 2023-04-27 LAB — INTERPRETATION:

## 2023-04-28 LAB — C. DIFFICILE GDH AND TOXIN A/B
GDH ANTIGEN: NOT DETECTED
MICRO NUMBER:: 15737040
SPECIMEN QUALITY:: ADEQUATE
TOXIN A AND B: NOT DETECTED

## 2023-04-29 ENCOUNTER — Ambulatory Visit (INDEPENDENT_AMBULATORY_CARE_PROVIDER_SITE_OTHER): Payer: BC Managed Care – PPO | Admitting: Family Medicine

## 2023-04-29 ENCOUNTER — Other Ambulatory Visit: Payer: Self-pay | Admitting: Nurse Practitioner

## 2023-04-29 LAB — GASTROINTESTINAL PATHOGEN PNL
CampyloBacter Group: NOT DETECTED
Norovirus GI/GII: NOT DETECTED
Rotavirus A: NOT DETECTED
Salmonella species: NOT DETECTED
Shiga Toxin 1: NOT DETECTED
Shiga Toxin 2: NOT DETECTED
Shigella Species: NOT DETECTED
Vibrio Group: NOT DETECTED
Yersinia enterocolitica: NOT DETECTED

## 2023-04-29 LAB — GIARDIA ANTIGEN
MICRO NUMBER:: 15736992
RESULT:: NOT DETECTED
SPECIMEN QUALITY:: ADEQUATE

## 2023-04-30 ENCOUNTER — Ambulatory Visit: Payer: BC Managed Care – PPO | Admitting: Nurse Practitioner

## 2023-04-30 ENCOUNTER — Encounter: Payer: Self-pay | Admitting: Nurse Practitioner

## 2023-04-30 VITALS — BP 132/80 | HR 89 | Temp 97.7°F | Ht 67.0 in | Wt 364.6 lb

## 2023-04-30 DIAGNOSIS — I10 Essential (primary) hypertension: Secondary | ICD-10-CM | POA: Diagnosis not present

## 2023-04-30 DIAGNOSIS — R197 Diarrhea, unspecified: Secondary | ICD-10-CM | POA: Diagnosis not present

## 2023-04-30 DIAGNOSIS — J4540 Moderate persistent asthma, uncomplicated: Secondary | ICD-10-CM | POA: Diagnosis not present

## 2023-04-30 DIAGNOSIS — G43009 Migraine without aura, not intractable, without status migrainosus: Secondary | ICD-10-CM | POA: Diagnosis not present

## 2023-04-30 NOTE — Progress Notes (Unsigned)
Danielle Amy, PA-C [address]  [address]  [place], [place] [address]  Main: [phone]  Fax: [phone]   Gastroenterology Consultation  Referring Provider:     Kara Dies, NP Primary Care Physician:  Danielle Dies, NP Primary Gastroenterologist:  Danielle Amy, PA-C / Dr. Wyline Mood   Reason for Consultation:     Multiple GI symptoms        HPI:   Danielle Irwin is a 52 y.o. y/o female referred for consultation & management  by Danielle Dies, NP.    Referred to evaluate nausea, vomiting, abdominal pain, GERD, diarrhea, and gas.  Medical history significant for migraines, asthma, OSA, morbid obesity, fibromyalgia, depression, anxiety.  She has had recurrent intractable migraine headaches in the past month.  Admits to moderate stress.  Father in hospice.  Work stress.  Patient states for 2 or 3 months she has been having episodes of abdominal cramping, diarrhea, acid reflux, nausea, and intestinal gas.  She was on 2 antibiotics in the past 6 months.  Has also been taking probiotic.  GI symptoms are episodic.  Abdominal cramping is located in the bilateral lower abdomen followed by diarrhea.  She has 2 or 3 stools per day some days and then other days has 12-13 stools per day.  Denies melena or hematochezia.  Occasional nocturnal regurgitation of acid.  Not currently on PPI or H2 RB.  She takes OTC antacid as needed.  Reports history of lactose intolerance as a child.    04/25/2023: Stool studies: Negative C. difficile, negative Giardia, negative GI pathogen panel.  04/23/2023: Negative food allergy panel and negative alpha gal.  Normal CBC.  03/28/2023: Normal CBC, CMP, TSH  09/2022 Colonoscopy (Screening) by Dr. Tobi Bastos: Normal.  10-year repeat.  No previous EGD.  No recent abdominal imaging.  No test for H. pylori or celiac.    Past Medical History:  Diagnosis Date   Anemia    Anxiety    Asthma    Back pain    Depression    Edema of both lower  extremities    Epstein Barr infection    Family history of adverse reaction to anesthesia    FATHER WAS SLOW TO WAKE UP   Fibromyalgia    GERD (gastroesophageal reflux disease)    H/O blood clots    Headache    OCCASIONAL MIGRAINES   History of fibromyalgia    HNP (herniated nucleus pulposus), lumbar    Hx of epistaxis    Hypertension    Insomnia    Joint pain    OA (osteoarthritis)    Obesity    PCOS (polycystic ovarian syndrome)    Sensitive skin    Sleep apnea    Vitamin D deficiency     Past Surgical History:  Procedure Laterality Date   BREAST BIOPSY Left 09/17/2022   Stereo bx, Ribbon Clip, path pending   BREAST BIOPSY Left 09/17/2022   MM LT BREAST BX W LOC DEV 1ST LESION IMAGE BX SPEC STEREO GUIDE 09/17/2022 [facility]   COLONOSCOPY WITH PROPOFOL N/A 09/09/2022   Procedure: COLONOSCOPY WITH PROPOFOL;  Surgeon: Wyline Mood, MD;  Location: [facility] ENDOSCOPY;  Service: Gastroenterology;  Laterality: N/A;   LUMBAR LAMINECTOMY/DECOMPRESSION MICRODISCECTOMY Right 06/02/2014   Procedure: MICRO LUMBAR DECOMPRESSION L4 - L5 ON THE RIGHT 1 LEVEL;  Surgeon: Javier Docker, MD;  Location: [facility];  Service: Orthopedics;  Laterality: Right;   ORIF HUMERUS FRACTURE Left 08/01/2017   Procedure: OPEN REDUCTION INTERNAL FIXATION (  ORIF) PROXIMAL HUMERUS FRACTURE;  Surgeon: Roby Lofts, MD;  Location: [facility];  Service: Orthopedics;  Laterality: Left;   WISDOM TOOTH EXTRACTION      Prior to Admission medications   Medication Sig Start Date End Date Taking? Authorizing Provider  acetaminophen (TYLENOL) 650 MG CR tablet Take 650 mg by mouth every 8 (eight) hours as needed for pain.    [provider]  albuterol (VENTOLIN HFA) 108 (90 Base) MCG/ACT inhaler Inhale 2 puffs into the lungs every 6 (six) hours as needed for wheezing or shortness of breath. 07/18/22   Danielle Dies, NP  Alum & Mag Hydroxide-Simeth (ANTACID ANTI-GAS PO) Take 1 tablet by mouth daily as needed (for  gas).    [provider]  Ascorbic Acid (VITAMIN C) 1000 MG tablet Take 1,000 mg by mouth 2 (two) times daily.    [provider]  aspirin EC 81 MG tablet Take 81 mg by mouth daily. Swallow whole.    [provider]  aspirin-acetaminophen-caffeine (EXCEDRIN MIGRAINE) [phone] MG tablet Take 2 tablets by mouth 2 (two) times daily as needed for headache or migraine.    [provider]  b complex vitamins tablet Take 1 tablet by mouth daily.    [provider]  bismuth subsalicylate (PEPTO-BISMOL) 262 MG/15ML suspension Take 30 mLs by mouth 4 (four) times daily -  before meals and at bedtime. 08/27/18   Nche, Bonna Gains, NP  budesonide-formoterol (SYMBICORT) 80-4.5 MCG/ACT inhaler Inhale 2 puffs into the lungs 2 (two) times daily. 07/30/22   Danielle Grana, FNP  Calcium Carbonate-Vitamin D (CALCIUM-D PO) Take 1,200 mg by mouth 2 (two) times daily.    [provider]  cyclobenzaprine (FLEXERIL) 10 MG tablet Take 1 tablet by mouth three times daily as needed for muscle spasm 04/02/23   Danielle Dies, NP  [facility] SEAL PO Take 1 tablet by mouth 2 (two) times daily as needed (immune system boost).    [provider]  EPINEPHrine 0.3 mg/0.3 mL IJ SOAJ injection INJECT 0.3ML INTO THE MUSCLE ONCE AS DIRECTED 07/18/22   Danielle Dies, NP  furosemide (LASIX) 20 MG tablet Take 1 tablet (20 mg total) by mouth daily. 12/05/20   Danielle Downs, MD  hydrochlorothiazide (HYDRODIURIL) 25 MG tablet Take 25 mg by mouth daily.    [provider]  hydrOXYzine (VISTARIL) 25 MG capsule Take 1 capsule (25 mg total) by mouth every 8 (eight) hours as needed. 01/30/23   Danielle Dies, NP  lisinopril (ZESTRIL) 10 MG tablet Take 1 tablet (10 mg total) by mouth daily. 10/22/22   Danielle Dies, NP  Magnesium 250 MG TABS Take 500 mg by mouth at bedtime. Dosage varies    [provider]  Melatonin 10 MG TABS Take 1 tablet by mouth  at bedtime.    [provider]  Multiple Vitamin (MULTIVITAMIN WITH MINERALS) TABS tablet Take 1 tablet by mouth daily. With [facility]    [provider]  Multiple Vitamins-Minerals (ZINC PO) Take 500 mg by mouth daily as needed (immune system boost).    [provider]  nystatin (MYCOSTATIN/NYSTOP) powder Apply 1 Application topically 3 (three) times daily. 03/10/23   Danielle Dicker, NP  ondansetron (ZOFRAN) 4 MG tablet TAKE 1 TABLET BY MOUTH EVERY 8 HOURS AS NEEDED FOR NAUSEA FOR VOMITING 04/02/23   Danielle Dies, NP  propranolol (INDERAL) 40 MG tablet Take 1 tablet (40 mg total) by mouth daily. 04/10/23   Danielle Dies, NP  rosuvastatin (CRESTOR) 5  MG tablet Take 1 tablet (5 mg total) by mouth at bedtime. 02/25/23   Danielle Irwin, Danielle Pound, DO  sertraline (ZOLOFT) 100 MG tablet Take 1 tablet (100 mg total) by mouth daily. 01/07/23   Danielle Dies, NP  traZODone (DESYREL) 100 MG tablet Take 1 tablet (100 mg total) by mouth at bedtime. 01/03/23   Danielle Dies, NP  Ubrogepant (UBRELVY) 50 MG TABS Take 50-100mg  at headache onset. Can repeat after 2 hours if needed. Do not exceed 200mg  in 24 hours. 01/30/23   Danielle Dies, NP  Vitamin D, Ergocalciferol, (DRISDOL) 1.25 MG (50000 UNIT) CAPS capsule 1 po q Wed and 1 po q Sun 04/08/23   Danielle Lot, DO  vitamin k 100 MCG tablet Take 100 mcg by mouth daily.    [provider]    Family History  Problem Relation Age of Onset   Diabetes Mother    High blood pressure Mother    High Cholesterol Mother    Depression Mother    Anxiety disorder Mother    Sleep apnea Mother    Obesity Mother    Heart attack Father 81   High blood pressure Father    High Cholesterol Father    Heart disease Father    Breast cancer Maternal Aunt 82   Ovarian cancer Neg Hx      Social History   Tobacco Use   Smoking status: Former    Current packs/day: 0.00    Types: Cigarettes    Quit date: 05/26/1990    Years  since quitting: 32.9   Smokeless tobacco: Never  Vaping Use   Vaping status: Never Used  Substance Use Topics   Alcohol use: Yes    Comment: occ   Drug use: No    Allergies as of 05/01/2023 - Review Complete 05/01/2023  Allergen Reaction Noted   Benzalkonium chloride Anaphylaxis and Hives 12/28/2014   Neosporin [neomycin-bacitracin zn-polymyx] Anaphylaxis and Hives 12/28/2014   Polysporin [bacitracin-polymyxin b] Hives 01/06/2018   Bee venom Hives and Swelling May 02, 1971   Bupropion hcl Other (See Comments) 06/28/1998   Cephalexin Hives and Swelling 06/21/1998   Latex  06/02/2014    Review of Systems:    All systems reviewed and negative except where noted in HPI.   Physical Exam:  BP 134/77 (BP Location: Right Arm, Patient Position: Sitting, Cuff Size: Normal)   Pulse 90   Temp 98.1 F (36.7 C) (Oral)   Ht 5\' 7"  (1.702 m)   Wt (!) 366 lb 4 oz (166.1 kg)   LMP 07/11/2016   BMI 57.36 kg/m  Patient's last menstrual period was 07/11/2016.  General:   Alert,  Well-developed, obese, pleasant and cooperative in NAD Lungs:  Respirations even and unlabored.  Clear throughout to auscultation.   No wheezes, crackles, or rhonchi. No acute distress. Heart:  Regular rate and rhythm; no murmurs, clicks, rubs, or gallops. Abdomen:  Normal bowel sounds.  No bruits.  Soft, and obese without masses, hepatosplenomegaly or hernias noted.  Mild Epigastric and LUQ Tenderness. No lower abdominal tenderness. No guarding or rebound tenderness.    Neurologic:  Alert and oriented x3;  grossly normal neurologically. Psych:  Alert and cooperative. Normal mood and affect.  Imaging Studies: No results found.  Assessment and Plan:   Lenisha ZARIYAH LERMAN is a 52 y.o. y/o female has been referred for:   1.  Diarrhea; suspect irritable bowel syndrome with diarrhea.  Recent stool studies negative for infection.  Recent alpha gal and reported allergy panel negative.  CBC, TSH, CMP normal.  -Lab: Celiac  panel  -Dicyclomine 20mg  TID prn, #90, 2RF  -Avoid Milk / Dairy, Sugar alcohols  2.  Epigastric Pain with Nausea  -H. Pylori Breath Test  3.  GERD  -After completing H. Pylori Test, Then start Protonix 40mg  every day.  Follow up 4 weeks with TG.  Danielle Amy, PA-C

## 2023-04-30 NOTE — Progress Notes (Signed)
Danielle Dicker, NP-C Phone: [phone]  Danielle Irwin is a 52 y.o. female who presents today for follow up.   Discussed the use of AI scribe software for clinical note transcription with the patient, who gave verbal consent to proceed.  History of Present Illness   The patient, who has a history of hypertension, migraines, and gastrointestinal issues, reports a recent increase in blood pressure readings at home, averaging 140-150/80. They attribute this to high stress levels due to a family member's recent hospitalization and transition to hospice care. The patient admits to inconsistent medication adherence over the past three weeks due to stress and fatigue.  The patient also reports experiencing migraines and an upset stomach. They have had some shortness of breath in the past few days, which they suspect may be related to their asthma. They have been using their inhaler and have a refill to pick up.   Regarding their migraines, the patient recently received a shot of Toradol and took [place], which seemed to alleviate the symptoms. They continue to struggle with gastrointestinal issues, experiencing 2-3 episodes of diarrhea daily, along with cramping and changes in stool consistency. They deny any nausea, vomiting, or fever. The patient is scheduled to see a GI specialist for further evaluation tomorrow.   Patient requesting LOA paperwork be completed for prior migraine episode in October for which her PCP and I evaluated her for on 04/08/2023 and 04/10/2023. Due to her PCP being out of the office, paperwork will be completed for her today for 04/04/2023 - 04/13/2023.      Social History   Tobacco Use  Smoking Status Former   Current packs/day: 0.00   Types: Cigarettes   Quit date: 05/26/1990   Years since quitting: 32.9  Smokeless Tobacco Never    Current Outpatient Medications on File Prior to Visit  Medication Sig Dispense Refill   acetaminophen (TYLENOL) 650 MG CR tablet Take  650 mg by mouth every 8 (eight) hours as needed for pain.     albuterol (VENTOLIN HFA) 108 (90 Base) MCG/ACT inhaler Inhale 2 puffs into the lungs every 6 (six) hours as needed for wheezing or shortness of breath. 8 g 2   Alum & Mag Hydroxide-Simeth (ANTACID ANTI-GAS PO) Take 1 tablet by mouth daily as needed (for gas).     Ascorbic Acid (VITAMIN C) 1000 MG tablet Take 1,000 mg by mouth 2 (two) times daily.     aspirin EC 81 MG tablet Take 81 mg by mouth daily. Swallow whole.     aspirin-acetaminophen-caffeine (EXCEDRIN MIGRAINE) 250-250-65 MG tablet Take 2 tablets by mouth 2 (two) times daily as needed for headache or migraine.     b complex vitamins tablet Take 1 tablet by mouth daily.     bismuth subsalicylate (PEPTO-BISMOL) 262 MG/15ML suspension Take 30 mLs by mouth 4 (four) times daily -  before meals and at bedtime. 360 mL 0   budesonide-formoterol (SYMBICORT) 80-4.5 MCG/ACT inhaler Inhale 2 puffs into the lungs 2 (two) times daily. 1 each 3   Calcium Carbonate-Vitamin D (CALCIUM-D PO) Take 1,200 mg by mouth 2 (two) times daily.     cyclobenzaprine (FLEXERIL) 10 MG tablet Take 1 tablet by mouth three times daily as needed for muscle spasm 30 tablet 0   ECHINACEA-GOLDEN SEAL PO Take 1 tablet by mouth 2 (two) times daily as needed (immune system boost).     EPINEPHrine 0.3 mg/0.3 mL IJ SOAJ injection INJECT 0.3ML INTO THE MUSCLE ONCE AS DIRECTED 2 each 0  furosemide (LASIX) 20 MG tablet Take 1 tablet (20 mg total) by mouth daily. 30 tablet 3   hydrochlorothiazide (HYDRODIURIL) 25 MG tablet Take 25 mg by mouth daily.     hydrOXYzine (VISTARIL) 25 MG capsule Take 1 capsule (25 mg total) by mouth every 8 (eight) hours as needed. 90 capsule 2   lisinopril (ZESTRIL) 10 MG tablet Take 1 tablet (10 mg total) by mouth daily. 90 tablet 0   Magnesium 250 MG TABS Take 500 mg by mouth at bedtime. Dosage varies     Melatonin 10 MG TABS Take 1 tablet by mouth at bedtime.     Multiple Vitamin (MULTIVITAMIN  WITH MINERALS) TABS tablet Take 1 tablet by mouth daily. With Elderberry     Multiple Vitamins-Minerals (ZINC PO) Take 500 mg by mouth daily as needed (immune system boost).     nystatin (MYCOSTATIN/NYSTOP) powder Apply 1 Application topically 3 (three) times daily. 60 g 1   ondansetron (ZOFRAN) 4 MG tablet TAKE 1 TABLET BY MOUTH EVERY 8 HOURS AS NEEDED FOR NAUSEA FOR VOMITING 30 tablet 0   propranolol (INDERAL) 40 MG tablet Take 1 tablet (40 mg total) by mouth daily. 30 tablet 0   rosuvastatin (CRESTOR) 5 MG tablet Take 1 tablet (5 mg total) by mouth at bedtime. 90 tablet 0   sertraline (ZOLOFT) 100 MG tablet Take 1 tablet (100 mg total) by mouth daily. 90 tablet 2   traZODone (DESYREL) 100 MG tablet Take 1 tablet (100 mg total) by mouth at bedtime. 90 tablet 1   Ubrogepant (UBRELVY) 50 MG TABS Take 50-100mg  at headache onset. Can repeat after 2 hours if needed. Do not exceed 200mg  in 24 hours. 30 tablet 0   Vitamin D, Ergocalciferol, (DRISDOL) 1.25 MG (50000 UNIT) CAPS capsule 1 po q Wed and 1 po q Sun 8 capsule 0   vitamin k 100 MCG tablet Take 100 mcg by mouth daily.     No current facility-administered medications on file prior to visit.     ROS see history of present illness  Objective  Physical Exam Vitals:   04/30/23 1307  BP: 132/80  Pulse: 89  Temp: 97.7 F (36.5 C)  SpO2: 97%    BP Readings from Last 3 Encounters:  04/30/23 132/80  04/23/23 126/88  04/10/23 132/84   Wt Readings from Last 3 Encounters:  04/30/23 (!) 364 lb 9.6 oz (165.4 kg)  04/23/23 (!) 362 lb (164.2 kg)  04/10/23 (!) 366 lb 3.2 oz (166.1 kg)    Physical Exam Constitutional:      General: She is not in acute distress.    Appearance: Normal appearance.  HENT:     Head: Normocephalic.  Cardiovascular:     Rate and Rhythm: Normal rate and regular rhythm.     Heart sounds: Normal heart sounds.  Pulmonary:     Effort: Pulmonary effort is normal.     Breath sounds: Normal breath sounds.   Skin:    General: Skin is warm and dry.  Neurological:     General: No focal deficit present.     Mental Status: She is alert.  Psychiatric:        Mood and Affect: Mood normal.        Behavior: Behavior normal.    Assessment/Plan: Please see individual problem list.  Migraine without aura and without status migrainosus, not intractable Assessment & Plan: They experienced a recent severe migraine episode, which was managed effectively with Toradol and Ubrelvy. We will continue  the current management with Bernita Raisin for abortive therapy and Propranolol for prophylaxis. If migraines are increasing in frequency will consider increasing Propranolol. LOA paperwork completed for migraine episode in October.    Diarrhea, unspecified type Assessment & Plan: They are experiencing ongoing diarrhea, with 2-3 episodes daily, accompanied by cramping. Recent stool samples and labs were negative. Consistent with IBS. She is scheduled to see a GI specialist for further evaluation and management tomorrow.    Primary hypertension Assessment & Plan: Blood pressure readings at home have been averaging 140-150/80, contrasting with 132/80 in the office. They report inconsistent medication adherence, attributed to high stress levels and personal issues. We encouraged consistent use of Lisinopril and Hydrochlorothiazide as prescribed. Encouraged to continue monitoring her blood pressure at home and contact if consistently elevated over 140/90.   Moderate persistent asthma, unspecified whether complicated Assessment & Plan: They have reported recent episodes of shortness of breath, likely related to asthma, with an inhaler refill pending pickup. We encouraged consistent use of their inhaler and to ensure the refill is picked up. Return precautions given to patient, if worsening she will seek medical attention.     Return if symptoms worsen or fail to improve.   Danielle Dicker, NP-C [facility] -  [employer]

## 2023-04-30 NOTE — Assessment & Plan Note (Signed)
They have reported recent episodes of shortness of breath, likely related to asthma, with an inhaler refill pending pickup. We encouraged consistent use of their inhaler and to ensure the refill is picked up. Return precautions given to patient, if worsening she will seek medical attention.

## 2023-04-30 NOTE — Assessment & Plan Note (Addendum)
They experienced a recent severe migraine episode, which was managed effectively with Toradol and Ubrelvy. We will continue the current management with Ubrelvy for abortive therapy and Propranolol for prophylaxis. If migraines are increasing in frequency will consider increasing Propranolol. LOA paperwork completed for migraine episode in October.

## 2023-04-30 NOTE — Assessment & Plan Note (Signed)
They are experiencing ongoing diarrhea, with 2-3 episodes daily, accompanied by cramping. Recent stool samples and labs were negative. Consistent with IBS. She is scheduled to see a GI specialist for further evaluation and management tomorrow.

## 2023-04-30 NOTE — Assessment & Plan Note (Signed)
Blood pressure readings at home have been averaging 140-150/80, contrasting with 132/80 in the office. They report inconsistent medication adherence, attributed to high stress levels and personal issues. We encouraged consistent use of Lisinopril and Hydrochlorothiazide as prescribed. Encouraged to continue monitoring her blood pressure at home and contact if consistently elevated over 140/90.

## 2023-05-01 ENCOUNTER — Ambulatory Visit: Payer: BC Managed Care – PPO | Admitting: Physician Assistant

## 2023-05-01 ENCOUNTER — Encounter: Payer: Self-pay | Admitting: Physician Assistant

## 2023-05-01 VITALS — BP 134/77 | HR 90 | Temp 98.1°F | Ht 67.0 in | Wt 366.2 lb

## 2023-05-01 DIAGNOSIS — R109 Unspecified abdominal pain: Secondary | ICD-10-CM

## 2023-05-01 DIAGNOSIS — R110 Nausea: Secondary | ICD-10-CM | POA: Diagnosis not present

## 2023-05-01 DIAGNOSIS — R197 Diarrhea, unspecified: Secondary | ICD-10-CM | POA: Diagnosis not present

## 2023-05-01 DIAGNOSIS — K219 Gastro-esophageal reflux disease without esophagitis: Secondary | ICD-10-CM | POA: Diagnosis not present

## 2023-05-01 DIAGNOSIS — R1013 Epigastric pain: Secondary | ICD-10-CM | POA: Diagnosis not present

## 2023-05-01 MED ORDER — PANTOPRAZOLE SODIUM 40 MG PO TBEC: 40.0000 mg | DELAYED_RELEASE_TABLET | Freq: Every day | ORAL | 2 refills | Status: DC

## 2023-05-01 MED ORDER — DICYCLOMINE HCL 20 MG PO TABS: 20.0000 mg | ORAL_TABLET | Freq: Three times a day (TID) | ORAL | 2 refills | Status: DC

## 2023-05-02 ENCOUNTER — Other Ambulatory Visit: Payer: Self-pay | Admitting: Physician Assistant

## 2023-05-02 DIAGNOSIS — R197 Diarrhea, unspecified: Secondary | ICD-10-CM | POA: Diagnosis not present

## 2023-05-02 DIAGNOSIS — R1013 Epigastric pain: Secondary | ICD-10-CM | POA: Diagnosis not present

## 2023-05-05 ENCOUNTER — Ambulatory Visit: Payer: BC Managed Care – PPO | Admitting: Primary Care

## 2023-05-05 LAB — CELIAC DISEASE AB SCREEN W/RFX
Antigliadin Abs, IgA: 4 U (ref 0–19)
IgA/Immunoglobulin A, Serum: 202 mg/dL (ref 87–352)
Transglutaminase IgA: <2 U/mL (ref 0–3)

## 2023-05-05 LAB — H. PYLORI BREATH TEST: H pylori Breath Test: NEGATIVE

## 2023-05-06 ENCOUNTER — Encounter: Payer: Self-pay | Admitting: Nurse Practitioner

## 2023-05-06 ENCOUNTER — Ambulatory Visit: Payer: BC Managed Care – PPO | Admitting: Nurse Practitioner

## 2023-05-06 VITALS — BP 138/78 | HR 88 | Temp 97.9°F | Resp 16 | Ht 67.0 in | Wt 360.4 lb

## 2023-05-06 DIAGNOSIS — G43009 Migraine without aura, not intractable, without status migrainosus: Secondary | ICD-10-CM

## 2023-05-06 MED ORDER — PROPRANOLOL HCL ER 80 MG PO CP24: 80.0000 mg | ORAL_CAPSULE | Freq: Every day | ORAL | 1 refills | Status: AC

## 2023-05-06 MED ORDER — UBRELVY 100 MG PO TABS: ORAL_TABLET | ORAL | 5 refills | Status: DC

## 2023-05-06 MED ORDER — KETOROLAC TROMETHAMINE 60 MG/2ML IM SOLN
60.0000 mg | Freq: Once | INTRAMUSCULAR | Status: AC
  Administered 2023-05-06: 60 mg via INTRAMUSCULAR

## 2023-05-06 NOTE — Progress Notes (Signed)
Bethanie Dicker, NP-C Phone: [phone]  Danielle Irwin is a 52 y.o. female who presents today for migraine.   Discussed the use of AI scribe software for clinical note transcription with the patient, who gave verbal consent to proceed.  History of Present Illness   The patient, with a history of migraines, presents with a recurrence of their typical symptoms, including light and sound sensitivity, and movement-associated discomfort. The current episode began last Thursday, improved by Sunday, but has since returned following a lengthy dental procedure yesterday. The patient has been managing the pain with Bernita Raisin and Tylenol, but is currently out of [place]. They have been taking propranolol (Inderal) at bedtime for migraine prevention.  The patient also reports nausea, but no vomiting. They note that they have not been eating much recently. They also mention a recent dental procedure that lasted four hours, which was followed by a throbbing headache. The patient also reports some dizziness upon standing up too quickly, but this is not associated with the headache.   The patient has been absent from work since last Wednesday due to the severity of their symptoms. They express a high level of stress in their current life situation, but attribute this more to situational factors rather than a lack of efficacy of their current mood medication, Zoloft.      Social History   Tobacco Use  Smoking Status Former   Current packs/day: 0.00   Types: Cigarettes   Quit date: 05/26/1990   Years since quitting: 32.9  Smokeless Tobacco Never    Current Outpatient Medications on File Prior to Visit  Medication Sig Dispense Refill   acetaminophen (TYLENOL) 650 MG CR tablet Take 650 mg by mouth every 8 (eight) hours as needed for pain.     albuterol (VENTOLIN HFA) 108 (90 Base) MCG/ACT inhaler Inhale 2 puffs into the lungs every 6 (six) hours as needed for wheezing or shortness of breath. 8 g 2   Alum &  Mag Hydroxide-Simeth (ANTACID ANTI-GAS PO) Take 1 tablet by mouth daily as needed (for gas).     Ascorbic Acid (VITAMIN C) 1000 MG tablet Take 1,000 mg by mouth 2 (two) times daily.     aspirin EC 81 MG tablet Take 81 mg by mouth daily. Swallow whole.     aspirin-acetaminophen-caffeine (EXCEDRIN MIGRAINE) 250-250-65 MG tablet Take 2 tablets by mouth 2 (two) times daily as needed for headache or migraine.     b complex vitamins tablet Take 1 tablet by mouth daily.     bismuth subsalicylate (PEPTO-BISMOL) 262 MG/15ML suspension Take 30 mLs by mouth 4 (four) times daily -  before meals and at bedtime. 360 mL 0   budesonide-formoterol (SYMBICORT) 80-4.5 MCG/ACT inhaler Inhale 2 puffs into the lungs 2 (two) times daily. 1 each 3   Calcium Carbonate-Vitamin D (CALCIUM-D PO) Take 1,200 mg by mouth 2 (two) times daily.     cyclobenzaprine (FLEXERIL) 10 MG tablet Take 1 tablet by mouth three times daily as needed for muscle spasm 30 tablet 0   dicyclomine (BENTYL) 20 MG tablet Take 1 tablet (20 mg total) by mouth 3 (three) times daily before meals. 90 tablet 2   ECHINACEA-GOLDEN SEAL PO Take 1 tablet by mouth 2 (two) times daily as needed (immune system boost).     EPINEPHrine 0.3 mg/0.3 mL IJ SOAJ injection INJECT 0.3ML INTO THE MUSCLE ONCE AS DIRECTED 2 each 0   furosemide (LASIX) 20 MG tablet Take 1 tablet (20 mg total) by mouth daily.  30 tablet 3   hydrochlorothiazide (HYDRODIURIL) 25 MG tablet Take 25 mg by mouth daily.     hydrOXYzine (VISTARIL) 25 MG capsule TAKE 1 CAPSULE BY MOUTH EVERY 8 HOURS AS NEEDED 90 capsule 0   lisinopril (ZESTRIL) 10 MG tablet Take 1 tablet (10 mg total) by mouth daily. 90 tablet 0   Magnesium 250 MG TABS Take 500 mg by mouth at bedtime. Dosage varies     Melatonin 10 MG TABS Take 1 tablet by mouth at bedtime.     Multiple Vitamin (MULTIVITAMIN WITH MINERALS) TABS tablet Take 1 tablet by mouth daily. With Elderberry     Multiple Vitamins-Minerals (ZINC PO) Take 500 mg by  mouth daily as needed (immune system boost).     nystatin (MYCOSTATIN/NYSTOP) powder Apply 1 Application topically 3 (three) times daily. 60 g 1   ondansetron (ZOFRAN) 4 MG tablet TAKE 1 TABLET BY MOUTH EVERY 8 HOURS AS NEEDED FOR NAUSEA FOR VOMITING 30 tablet 0   pantoprazole (PROTONIX) 40 MG tablet Take 1 tablet (40 mg total) by mouth daily. 30 tablet 2   rosuvastatin (CRESTOR) 5 MG tablet Take 1 tablet (5 mg total) by mouth at bedtime. 90 tablet 0   sertraline (ZOLOFT) 100 MG tablet Take 1 tablet (100 mg total) by mouth daily. 90 tablet 2   traZODone (DESYREL) 100 MG tablet Take 1 tablet (100 mg total) by mouth at bedtime. 90 tablet 1   Vitamin D, Ergocalciferol, (DRISDOL) 1.25 MG (50000 UNIT) CAPS capsule 1 po q Wed and 1 po q Sun 8 capsule 0   vitamin k 100 MCG tablet Take 100 mcg by mouth daily.     No current facility-administered medications on file prior to visit.    ROS see history of present illness  Objective  Physical Exam Vitals:   05/06/23 1100  BP: 138/78  Pulse: 88  Resp: 16  Temp: 97.9 F (36.6 C)  SpO2: 95%    BP Readings from Last 3 Encounters:  05/06/23 138/78  05/01/23 134/77  04/30/23 132/80   Wt Readings from Last 3 Encounters:  05/06/23 (!) 360 lb 6 oz (163.5 kg)  05/01/23 (!) 366 lb 4 oz (166.1 kg)  04/30/23 (!) 364 lb 9.6 oz (165.4 kg)    Physical Exam Constitutional:      General: She is not in acute distress.    Appearance: Normal appearance.  HENT:     Head: Normocephalic.  Cardiovascular:     Rate and Rhythm: Normal rate and regular rhythm.     Heart sounds: Normal heart sounds.  Pulmonary:     Effort: Pulmonary effort is normal.     Breath sounds: Normal breath sounds.  Skin:    General: Skin is warm and dry.  Neurological:     General: No focal deficit present.     Mental Status: She is alert and oriented to person, place, and time.     Cranial Nerves: No dysarthria or facial asymmetry.     Sensory: No sensory deficit.      Motor: No weakness.  Psychiatric:        Mood and Affect: Mood normal.        Behavior: Behavior normal.    Assessment/Plan: Please see individual problem list.  Migraine without aura and without status migrainosus, not intractable Assessment & Plan: They experience recurrent migraines with sensitivity to light and sound, currently managed with Propranolol 40 mg daily and Ubrelvy 50 mg as needed. A recent dental procedure may  have worsened their symptoms. We will increase Propranolol to 80 mg ER for better prevention and increase the Ubrelvy dose to 100 mg, providing a new prescription. Today, we will administer Toradol IM for acute relief and provide patient with 2 samples of Ubrelvy 100 mg to use until she is able to get her new prescription. They have an appointment to establish with neurology scheduled for next Thursday. Work note provided.   Orders: Bernita Raisin; Take 1 tablet by mouth at onset of headache. May repeat dose x 1 after 2 hours if needed. Max 200 mg/ 24 hours.  Dispense: 16 tablet; Refill: 5 -     Propranolol HCl ER; Take 1 capsule (80 mg total) by mouth daily.  Dispense: 90 capsule; Refill: 1 -     Ketorolac Tromethamine   Return if symptoms worsen or fail to improve.   Bethanie Dicker, NP-C [facility] - [employer]

## 2023-05-06 NOTE — Assessment & Plan Note (Addendum)
They experience recurrent migraines with sensitivity to light and sound, currently managed with Propranolol 40 mg daily and Ubrelvy 50 mg as needed. A recent dental procedure may have worsened their symptoms. We will increase Propranolol to 80 mg ER for better prevention and increase the Ubrelvy dose to 100 mg, providing a new prescription. Today, we will administer Toradol IM for acute relief and provide patient with 2 samples of Ubrelvy 100 mg to use until she is able to get her new prescription. They have an appointment to establish with neurology scheduled for next Thursday. Work note provided.

## 2023-05-06 NOTE — Progress Notes (Signed)
Provider gave verbal orders to give pt  60 mg  of ketorolac as well as two samples of Ubrelvy 100 mg.    Pt tolerated the injection well in the left anterior thigh.   Medication Samples have been provided to the patient.  Drug name: UBRELVY   Strength:100mg         Qty: 2 boxes LOT: [id number]  Exp.Date: 06-08-24  Dosing instructions: Take 1 tablet by mouth at onset of headache. May repeat dose x 1 after 2 hours if needed. Max 200 mg/ 24 hours.   The patient has been instructed regarding the correct time, dose, and frequency of taking this medication, including desired effects and most common side effects.   Donavan Foil 11:37 AM 05/06/2023

## 2023-05-13 ENCOUNTER — Encounter: Payer: Self-pay | Admitting: Nurse Practitioner

## 2023-05-13 ENCOUNTER — Ambulatory Visit: Payer: BC Managed Care – PPO | Admitting: Nurse Practitioner

## 2023-05-13 VITALS — BP 134/84 | HR 114 | Temp 97.4°F | Ht 67.0 in | Wt 360.0 lb

## 2023-05-13 DIAGNOSIS — M5442 Lumbago with sciatica, left side: Secondary | ICD-10-CM

## 2023-05-13 MED ORDER — PREDNISONE 10 MG PO TABS: ORAL_TABLET | ORAL | 0 refills | Status: DC

## 2023-05-13 MED ORDER — MELOXICAM 7.5 MG PO TABS: 7.5000 mg | ORAL_TABLET | Freq: Every day | ORAL | 0 refills | Status: DC

## 2023-05-13 NOTE — Patient Instructions (Signed)
Rx sent to the pharmacy  Acute Back Pain, Adult Acute back pain is sudden and usually short-lived. It is often caused by an injury to the muscles and tissues in the back. The injury may result from: A muscle, tendon, or ligament getting overstretched or torn. Ligaments are tissues that connect bones to each other. Lifting something improperly can cause a back strain. Wear and tear (degeneration) of the spinal disks. Spinal disks are circular tissue that provide cushioning between the bones of the spine (vertebrae). Twisting motions, such as while playing sports or doing yard work. A hit to the back. Arthritis. You may have a physical exam, lab tests, and imaging tests to find the cause of your pain. Acute back pain usually goes away with rest and home care. Follow these instructions at home: Managing pain, stiffness, and swelling Take over-the-counter and prescription medicines only as told by your health care provider. Treatment may include medicines for pain and inflammation that are taken by mouth or applied to the skin, or muscle relaxants. Your health care provider may recommend applying ice during the first 24-48 hours after your pain starts. To do this: Put ice in a plastic bag. Place a towel between your skin and the bag. Leave the ice on for 20 minutes, 2-3 times a day. Remove the ice if your skin turns bright red. This is very important. If you cannot feel pain, heat, or cold, you have a greater risk of damage to the area. If directed, apply heat to the affected area as often as told by your health care provider. Use the heat source that your health care provider recommends, such as a moist heat pack or a heating pad. Place a towel between your skin and the heat source. Leave the heat on for 20-30 minutes. Remove the heat if your skin turns bright red. This is especially important if you are unable to feel pain, heat, or cold. You have a greater risk of getting burned. Activity  Do  not stay in bed. Staying in bed for more than 1-2 days can delay your recovery. Sit up and stand up straight. Avoid leaning forward when you sit or hunching over when you stand. If you work at a desk, sit close to it so you do not need to lean over. Keep your chin tucked in. Keep your neck drawn back, and keep your elbows bent at a 90-degree angle (right angle). Sit high and close to the steering wheel when you drive. Add lower back (lumbar) support to your car seat, if needed. Take short walks on even surfaces as soon as you are able. Try to increase the length of time you walk each day. Do not sit, drive, or stand in one place for more than 30 minutes at a time. Sitting or standing for long periods of time can put stress on your back. Do not drive or use heavy machinery while taking prescription pain medicine. Use proper lifting techniques. When you bend and lift, use positions that put less stress on your back: [place] your knees. Keep the load close to your body. Avoid twisting. Exercise regularly as told by your health care provider. Exercising helps your back heal faster and helps prevent back injuries by keeping muscles strong and flexible. Work with a physical therapist to make a safe exercise program, as recommended by your health care provider. Do any exercises as told by your physical therapist. Lifestyle Maintain a healthy weight. Extra weight puts stress on your back and  makes it difficult to have good posture. Avoid activities or situations that make you feel anxious or stressed. Stress and anxiety increase muscle tension and can make back pain worse. Learn ways to manage anxiety and stress, such as through exercise. General instructions Sleep on a firm mattress in a comfortable position. Try lying on your side with your knees slightly bent. If you lie on your back, put a pillow under your knees. Keep your head and neck in a straight line with your spine (neutral position) when using  electronic equipment like smartphones or pads. To do this: Raise your smartphone or pad to look at it instead of bending your head or neck to look down. Put the smartphone or pad at the level of your face while looking at the screen. Follow your treatment plan as told by your health care provider. This may include: Cognitive or behavioral therapy. Acupuncture or massage therapy. Meditation or yoga. Contact a health care provider if: You have pain that is not relieved with rest or medicine. You have increasing pain going down into your legs or buttocks. Your pain does not improve after 2 weeks. You have pain at night. You lose weight without trying. You have a fever or chills. You develop nausea or vomiting. You develop abdominal pain. Get help right away if: You develop new bowel or bladder control problems. You have unusual weakness or numbness in your arms or legs. You feel faint. These symptoms may represent a serious problem that is an emergency. Do not wait to see if the symptoms will go away. Get medical help right away. Call your local emergency services (911 in the [place] Do not drive yourself to the hospital. Summary Acute back pain is sudden and usually short-lived. Use proper lifting techniques. When you bend and lift, use positions that put less stress on your back. Take over-the-counter and prescription medicines only as told by your health care provider, and apply heat or ice as told. This information is not intended to replace advice given to you by your health care provider. Make sure you discuss any questions you have with your health care provider. Document Revised: 08/18/2020 Document Reviewed: 08/18/2020 Elsevier Patient Education  2024 [employer]. .

## 2023-05-13 NOTE — Progress Notes (Signed)
Established Patient Office Visit  Subjective:  Patient ID: Danielle Irwin, female    DOB: 24-Jul-1970  Age: 52 y.o. MRN: [id number]  CC:  Chief Complaint  Patient presents with   Acute Visit    Low back pain started on 05/11/23 when helping Mom move stuff around the house    HPI  Danielle Irwin presents for:  Back Pain This is a chronic problem. The current episode started in the past 7 days (was moving stuff at home). The problem occurs constantly. The problem is unchanged. The pain is present in the lumbar spine. Quality: throbbing. The pain radiates to the left knee. The pain is moderate. The pain is Worse during the day. The symptoms are aggravated by bending, position, standing and sitting. Associated symptoms include leg pain. Pertinent negatives include no bladder incontinence, bowel incontinence, fever or tingling. Risk factors include obesity. She has tried muscle relaxant, NSAIDs and heat for the symptoms. The treatment provided mild relief.     Past Medical History:  Diagnosis Date   Anemia    Anxiety    Asthma    Back pain    Depression    Edema of both lower extremities    Epstein Barr infection    Family history of adverse reaction to anesthesia    FATHER WAS SLOW TO WAKE UP   Fibromyalgia    GERD (gastroesophageal reflux disease)    H/O blood clots    Headache    OCCASIONAL MIGRAINES   History of fibromyalgia    HNP (herniated nucleus pulposus), lumbar    Hx of epistaxis    Hypertension    Insomnia    Joint pain    OA (osteoarthritis)    Obesity    PCOS (polycystic ovarian syndrome)    Sensitive skin    Sleep apnea    Vitamin D deficiency     Past Surgical History:  Procedure Laterality Date   BREAST BIOPSY Left 09/17/2022   Stereo bx, Ribbon Clip, path pending   BREAST BIOPSY Left 09/17/2022   MM LT BREAST BX W LOC DEV 1ST LESION IMAGE BX SPEC STEREO GUIDE 09/17/2022 [facility]   COLONOSCOPY WITH PROPOFOL N/A 09/09/2022   Procedure:  COLONOSCOPY WITH PROPOFOL;  Surgeon: Wyline Mood, MD;  Location: [facility];  Service: Gastroenterology;  Laterality: N/A;   LUMBAR LAMINECTOMY/DECOMPRESSION MICRODISCECTOMY Right 06/02/2014   Procedure: MICRO LUMBAR DECOMPRESSION L4 - L5 ON THE RIGHT 1 LEVEL;  Surgeon: Javier Docker, MD;  Location: [facility];  Service: Orthopedics;  Laterality: Right;   ORIF HUMERUS FRACTURE Left 08/01/2017   Procedure: OPEN REDUCTION INTERNAL FIXATION (ORIF) PROXIMAL HUMERUS FRACTURE;  Surgeon: Roby Lofts, MD;  Location: [facility] OR;  Service: Orthopedics;  Laterality: Left;   WISDOM TOOTH EXTRACTION      Family History  Problem Relation Age of Onset   Diabetes Mother    High blood pressure Mother    High Cholesterol Mother    Depression Mother    Anxiety disorder Mother    Sleep apnea Mother    Obesity Mother    Heart attack Father 57   High blood pressure Father    High Cholesterol Father    Heart disease Father    Heart attack Brother    Breast cancer Maternal Aunt 50   Ovarian cancer Neg Hx     Social History   Socioeconomic History   Marital status: Single    Spouse name: Not on file   Number of children:  Not on file   Years of education: Not on file   Highest education level: Some college, no degree  Occupational History   Occupation: debt collections  Tobacco Use   Smoking status: Former    Current packs/day: 0.00    Types: Cigarettes    Quit date: 05/26/1990    Years since quitting: 33.0   Smokeless tobacco: Never  Vaping Use   Vaping status: Never Used  Substance and Sexual Activity   Alcohol use: Yes    Comment: occ   Drug use: No   Sexual activity: Not Currently    Partners: Male    Birth control/protection: Post-menopausal  Other Topics Concern   Not on file  Social History Narrative   Not on file   Social Drivers of Health   Financial Resource Strain: Patient Declined (05/27/2023)   Overall Financial Resource Strain (CARDIA)    Difficulty of Paying Living  Expenses: Patient declined  Recent Concern: Financial Resource Strain - Medium Risk (03/26/2023)   Overall Financial Resource Strain (CARDIA)    Difficulty of Paying Living Expenses: Somewhat hard  Food Insecurity: Patient Declined (05/27/2023)   Hunger Vital Sign    Worried About Running Out of Food in the Last Year: Patient declined    Ran Out of Food in the Last Year: Patient declined  Recent Concern: Food Insecurity - Food Insecurity Present (03/26/2023)   Hunger Vital Sign    Worried About Running Out of Food in the Last Year: Sometimes true    Ran Out of Food in the Last Year: Sometimes true  Transportation Needs: No Transportation Needs (05/27/2023)   PRAPARE - Administrator, Civil Service (Medical): No    Lack of Transportation (Non-Medical): No  Physical Activity: Unknown (05/27/2023)   Exercise Vital Sign    Days of Exercise per Week: 0 days    Minutes of Exercise per Session: Not on file  Stress: Stress Concern Present (05/27/2023)   [employer] of Occupational Health - Occupational Stress Questionnaire    Feeling of Stress : Rather much  Social Connections: Unknown (05/27/2023)   Social Connection and Isolation Panel [NHANES]    Frequency of Communication with Friends and Family: Patient declined    Frequency of Social Gatherings with Friends and Family: Patient declined    Attends Religious Services: Patient declined    Database administrator or Organizations: No    Attends Engineer, structural: Not on file    Marital Status: Never married  Recent Concern: Social Connections - Moderately Isolated (03/26/2023)   Social Connection and Isolation Panel [NHANES]    Frequency of Communication with Friends and Family: Three times a week    Frequency of Social Gatherings with Friends and Family: Never    Attends Religious Services: 1 to 4 times per year    Active Member of [employer] or Organizations: No    Attends Engineer, structural: Not on  file    Marital Status: Never married  Intimate Partner Violence: Not on file     Outpatient Medications Prior to Visit  Medication Sig Dispense Refill   acetaminophen (TYLENOL) 650 MG CR tablet Take 650 mg by mouth every 8 (eight) hours as needed for pain.     albuterol (VENTOLIN HFA) 108 (90 Base) MCG/ACT inhaler Inhale 2 puffs into the lungs every 6 (six) hours as needed for wheezing or shortness of breath. 8 g 2   Alum & Mag Hydroxide-Simeth (ANTACID ANTI-GAS PO) Take 1 tablet  by mouth daily as needed (for gas).     Ascorbic Acid (VITAMIN C) 1000 MG tablet Take 1,000 mg by mouth 2 (two) times daily.     aspirin EC 81 MG tablet Take 81 mg by mouth daily. Swallow whole.     aspirin-acetaminophen-caffeine (EXCEDRIN MIGRAINE) 250-250-65 MG tablet Take 2 tablets by mouth 2 (two) times daily as needed for headache or migraine.     b complex vitamins tablet Take 1 tablet by mouth daily.     bismuth subsalicylate (PEPTO-BISMOL) 262 MG/15ML suspension Take 30 mLs by mouth 4 (four) times daily -  before meals and at bedtime. 360 mL 0   budesonide-formoterol (SYMBICORT) 80-4.5 MCG/ACT inhaler Inhale 2 puffs into the lungs 2 (two) times daily. 1 each 3   Calcium Carbonate-Vitamin D (CALCIUM-D PO) Take 1,200 mg by mouth 2 (two) times daily.     cyclobenzaprine (FLEXERIL) 10 MG tablet Take 1 tablet by mouth three times daily as needed for muscle spasm 30 tablet 0   dicyclomine (BENTYL) 20 MG tablet Take 1 tablet (20 mg total) by mouth 3 (three) times daily before meals. 90 tablet 2   ECHINACEA-GOLDEN SEAL PO Take 1 tablet by mouth 2 (two) times daily as needed (immune system boost).     EPINEPHrine 0.3 mg/0.3 mL IJ SOAJ injection INJECT 0.3ML INTO THE MUSCLE ONCE AS DIRECTED 2 each 0   furosemide (LASIX) 20 MG tablet Take 1 tablet (20 mg total) by mouth daily. 30 tablet 3   hydrochlorothiazide (HYDRODIURIL) 25 MG tablet Take 25 mg by mouth daily.     hydrOXYzine (VISTARIL) 25 MG capsule TAKE 1 CAPSULE  BY MOUTH EVERY 8 HOURS AS NEEDED 90 capsule 0   lisinopril (ZESTRIL) 10 MG tablet Take 1 tablet (10 mg total) by mouth daily. 90 tablet 0   Magnesium 250 MG TABS Take 500 mg by mouth at bedtime. Dosage varies     Melatonin 10 MG TABS Take 1 tablet by mouth at bedtime.     Multiple Vitamin (MULTIVITAMIN WITH MINERALS) TABS tablet Take 1 tablet by mouth daily. With Elderberry     Multiple Vitamins-Minerals (ZINC PO) Take 500 mg by mouth daily as needed (immune system boost).     nystatin (MYCOSTATIN/NYSTOP) powder Apply 1 Application topically 3 (three) times daily. 60 g 1   ondansetron (ZOFRAN) 4 MG tablet TAKE 1 TABLET BY MOUTH EVERY 8 HOURS AS NEEDED FOR NAUSEA FOR VOMITING 30 tablet 0   pantoprazole (PROTONIX) 40 MG tablet Take 1 tablet (40 mg total) by mouth daily. 30 tablet 2   propranolol ER (INDERAL LA) 80 MG 24 hr capsule Take 1 capsule (80 mg total) by mouth daily. 90 capsule 1   rosuvastatin (CRESTOR) 5 MG tablet Take 1 tablet (5 mg total) by mouth at bedtime. 90 tablet 0   sertraline (ZOLOFT) 100 MG tablet Take 1 tablet (100 mg total) by mouth daily. 90 tablet 2   traZODone (DESYREL) 100 MG tablet Take 1 tablet (100 mg total) by mouth at bedtime. 90 tablet 1   Ubrogepant (UBRELVY) 100 MG TABS Take 1 tablet by mouth at onset of headache. May repeat dose x 1 after 2 hours if needed. Max 200 mg/ 24 hours. 16 tablet 5   Vitamin D, Ergocalciferol, (DRISDOL) 1.25 MG (50000 UNIT) CAPS capsule 1 po q Wed and 1 po q Sun 8 capsule 0   vitamin k 100 MCG tablet Take 100 mcg by mouth daily.     ondansetron (ZOFRAN-ODT)  4 MG disintegrating tablet Take 4 mg by mouth every 8 (eight) hours as needed.     No facility-administered medications prior to visit.    Allergies  Allergen Reactions   Benzalkonium Chloride Anaphylaxis and Hives   Neosporin [Neomycin-Bacitracin Zn-Polymyx] Anaphylaxis and Hives   Polysporin [Bacitracin-Polymyxin B] Hives   Bee Venom Hives and Swelling    Throat swelling    Bupropion Hcl Other (See Comments)    angry   Cephalexin Hives and Swelling   Latex     ROS Review of Systems  Constitutional:  Negative for fever.  Gastrointestinal:  Negative for bowel incontinence.  Genitourinary:  Negative for bladder incontinence.  Musculoskeletal:  Positive for back pain.  Neurological:  Negative for tingling.   Negative unless indicated in HPI.    Objective:    Physical Exam Constitutional:      Appearance: She is obese.  HENT:     Head: Normocephalic.  Cardiovascular:     Rate and Rhythm: Normal rate and regular rhythm.     Pulses: Normal pulses.     Heart sounds: Normal heart sounds.  Pulmonary:     Effort: Pulmonary effort is normal.     Breath sounds: Normal breath sounds. No stridor. No wheezing.  Musculoskeletal:        General: Tenderness present.       Back:     Comments: Lower left tenderness radiating to the left knee  Neurological:     Mental Status: She is alert.     BP 134/84   Pulse (!) 114   Temp (!) 97.4 F (36.3 C)   Ht 5\' 7"  (1.702 m)   Wt (!) 360 lb (163.3 kg)   LMP 07/11/2016   SpO2 96%   BMI 56.38 kg/m  Wt Readings from Last 3 Encounters:  05/20/23 (!) 366 lb 2 oz (166.1 kg)  05/15/23 (!) 366 lb (166 kg)  05/13/23 (!) 360 lb (163.3 kg)     Health Maintenance  Topic Date Due   Hepatitis C Screening  Never done   Zoster Vaccines- Shingrix (1 of 2) Never done   COVID-19 Vaccine (1 - 2024-25 season) 05/29/2023 (Originally 02/09/2023)   INFLUENZA VACCINE  09/08/2023 (Originally 01/09/2023)   MAMMOGRAM  09/05/2024   DTaP/Tdap/Td (2 - Td or Tdap) 06/25/2027   Cervical Cancer Screening (HPV/Pap Cotest)  09/02/2027   Colonoscopy  09/08/2032   HIV Screening  Completed   HPV VACCINES  Aged Out    There are no preventive care reminders to display for this patient.  Lab Results  Component Value Date   TSH 2.85 03/28/2023   Lab Results  Component Value Date   WBC 7.7 04/23/2023   HGB 14.1 04/23/2023   HCT  42.4 04/23/2023   MCV 94.0 04/23/2023   PLT 259.0 04/23/2023   Lab Results  Component Value Date   NA 139 03/28/2023   K 4.0 03/28/2023   CO2 27 03/28/2023   GLUCOSE 137 (H) 03/28/2023   BUN 14 03/28/2023   CREATININE 0.81 03/28/2023   BILITOT 0.8 03/28/2023   ALKPHOS 40 03/28/2023   AST 27 03/28/2023   ALT 27 03/28/2023   PROT 7.4 03/28/2023   ALBUMIN 4.3 03/28/2023   CALCIUM 9.4 03/28/2023   ANIONGAP 10 08/02/2017   EGFR 86 02/11/2023   GFR 83.33 03/28/2023   Lab Results  Component Value Date   CHOL 245 (H) 02/11/2023   Lab Results  Component Value Date   HDL 50 02/11/2023  Lab Results  Component Value Date   LDLCALC 170 (H) 02/11/2023   Lab Results  Component Value Date   TRIG 138 02/11/2023   Lab Results  Component Value Date   CHOLHDL 5 07/18/2022   Lab Results  Component Value Date   HGBA1C 5.7 (H) 02/11/2023      Assessment & Plan:  Left-sided low back pain with left-sided sciatica, unspecified chronicity Assessment & Plan: Low back pain will treat with prednisone tapering and Meloxican. Advised pt to use warm compress. Pt followed by ortho. Will continue to monitor.    Other orders -     predniSONE; Take 4 tablets ( 40 mg total) by mouth for 3 days; take 3 tablets ( 30 mg total) by mouth for 3 days; take 2 tablets ( 20 mg) by mouth for 3 days; take 1 tablet ( 10 mg total) by mouth for 3 days.  Dispense: 30 tablet; Refill: 0 -     Meloxicam; Take 1 tablet (7.5 mg total) by mouth daily.  Dispense: 30 tablet; Refill: 0    Follow-up: No follow-ups on file.   Kara Dies, NP

## 2023-05-14 NOTE — Progress Notes (Unsigned)
NEUROLOGY CONSULTATION NOTE  Danielle Irwin MRN: [id number] DOB: 03/05/1971  Referring provider: Kara Dies, NP Primary care provider: Kara Dies, NP  Reason for consult:  migraines  Assessment/Plan:   Chronic migraine with aura, without status migrainosus, intractable  As she has had change in her migraines, now frequent, more severe and in status migrainosus, will check MRI of brain with and without contrast to rule out secondary intracranial etiology. Migraine prevention:  Start Qulipta 60mg  daily Migraine rescue:  Advised to use the Bertrand as the first line at earliest onset of migraine.  Zofran at earliest onset as well.   Limit use of pain relievers to no more than 2 days out of week to prevent risk of rebound or medication-overuse headache. Keep headache diary Follow up 3 months.      Subjective:  Danielle Irwin is a 52 year old right-handed female HTN, fibromyalgia, arthritis, asthma, PCOS, depression and anxiety who presents for migraines.  History supplemented by referring provider's note.  Onset:  off and on since teenager.  Frequent and more severe over last 4-6 months Location:  left hemisphere, left eye, sometimes bilateral Quality:  stabbing in eye, pounding and squeezing of head Intensity:  4-5/10, 8/10 when severe.  She denies new headache, thunderclap headache or severe headache that wakes her from sleep. Aura:  Occasionally visual aura with wavy lines around objects lasting 1-2 days (occurs during migraine and lasting 1-2 days) Prodrome:  myofascial pain in shoulders and neck Associated symptoms:  nausea, photophobia, phonophobia, sometimes dizziness/vertigo, occasionally vomiting.  She denies associated unilateral numbness or weakness. Duration:  3-10 days (usually 1 week) Frequency:  5-6 in last 2 months lasting days to week Frequency of abortive medication: daily (also treating fibromyalgia and degenerative disc disease) Triggers:  fluoride,  stress, change in weather Relieving factors:  ice pack Activity:  movement aggravates. Missed work 12-14 days in last month.   From teenager to 40s, had complete black out of vision for 20-30 minutes.  Not had for years.  Likely migraine aura.    She does get routine eye exams.    MRI of brain without contrast on 05/07/2011 personally reviewed revealed mild scattered nonspecific tiny white matter hyperintensities in the bilateral cerebral hemispheres as well as small 8 mm pineal gland cyst but no acute findings.    Past NSAIDS/analgesics:  naproxen Past abortive triptans:  sumatriptan tab Past abortive ergotamine:  none Past muscle relaxants:  methocarbamol, metaxalone Past anti-emetic:  none Past antihypertensive medications:  none Past antidepressant medications:  duloxetine Past anticonvulsant medications:  pregablin (blurred vision) Past anti-CGRP:  none Past antihistamines/decongestants:  Sudafed, Benadryl, Flonase Other past therapies:  none  Rescue protocol:  Excedrin or ibuprofen with Tylenol first line.  Uses Ubrelvy as second line. Current NSAIDS/analgesics:  Excedrin Migraine, ibuprofen with Tylenol, ASA 81mg  daily, meloxicam Current triptans:  none Current ergotamine:  none Current anti-emetic:  ondansetron 4mg  Current muscle relaxants:  cyclobenzaprine 10mg  Current Antihypertensive medications:  propranolol ER 80mg  daily, lisinopril, hydrochlorothiazide, Lasix Current Antidepressant medications:  sertraline 100mg  daily Current Anticonvulsant medications:  none Current anti-CGRP:  Ubrelvy 100mg  Current Vitamins/Herbal/Supplements:  magnesium, melatonin, B complex, C, D, MVI Current Antihistamines/Decongestants:  none Other therapy:  none Other medications:  trazodone 100mg  at bedtime (insomnia),  hydroxyzine (anxiety), albuterol   Caffeine:  occasional coffee (aggravates IBS), rarely soda (2-3 a week) Diet:  Flavored water.  Skips meals Exercise:   no Depression/Anxiety:  yes.  Father just passed away Other pain:  fibromyalgia, degenerative disc disease Sleep hygiene:  poor.  Trouble falling asleep and staying asleep.  Has OSA.  Uses CPAP.  Uses trazodone - not helpful.  Average 5-6 hours a night Family history of headache:  no Other family history:  Father (Parkinson's disease)      PAST MEDICAL HISTORY: Past Medical History:  Diagnosis Date   Anemia    Anxiety    Asthma    Back pain    Depression    Edema of both lower extremities    Epstein Barr infection    Family history of adverse reaction to anesthesia    FATHER WAS SLOW TO WAKE UP   Fibromyalgia    GERD (gastroesophageal reflux disease)    H/O blood clots    Headache    OCCASIONAL MIGRAINES   History of fibromyalgia    HNP (herniated nucleus pulposus), lumbar    Hx of epistaxis    Hypertension    Insomnia    Joint pain    OA (osteoarthritis)    Obesity    PCOS (polycystic ovarian syndrome)    Sensitive skin    Sleep apnea    Vitamin D deficiency     PAST SURGICAL HISTORY: Past Surgical History:  Procedure Laterality Date   BREAST BIOPSY Left 09/17/2022   Stereo bx, Ribbon Clip, path pending   BREAST BIOPSY Left 09/17/2022   MM LT BREAST BX W LOC DEV 1ST LESION IMAGE BX SPEC STEREO GUIDE 09/17/2022 [facility]   COLONOSCOPY WITH PROPOFOL N/A 09/09/2022   Procedure: COLONOSCOPY WITH PROPOFOL;  Surgeon: Wyline Mood, MD;  Location: [facility] ENDOSCOPY;  Service: Gastroenterology;  Laterality: N/A;   LUMBAR LAMINECTOMY/DECOMPRESSION MICRODISCECTOMY Right 06/02/2014   Procedure: MICRO LUMBAR DECOMPRESSION L4 - L5 ON THE RIGHT 1 LEVEL;  Surgeon: Javier Docker, MD;  Location: [facility] ORS;  Service: Orthopedics;  Laterality: Right;   ORIF HUMERUS FRACTURE Left 08/01/2017   Procedure: OPEN REDUCTION INTERNAL FIXATION (ORIF) PROXIMAL HUMERUS FRACTURE;  Surgeon: Roby Lofts, MD;  Location: [facility] OR;  Service: Orthopedics;  Laterality: Left;   WISDOM TOOTH EXTRACTION       MEDICATIONS: Current Outpatient Medications on File Prior to Visit  Medication Sig Dispense Refill   acetaminophen (TYLENOL) 650 MG CR tablet Take 650 mg by mouth every 8 (eight) hours as needed for pain.     albuterol (VENTOLIN HFA) 108 (90 Base) MCG/ACT inhaler Inhale 2 puffs into the lungs every 6 (six) hours as needed for wheezing or shortness of breath. 8 g 2   Alum & Mag Hydroxide-Simeth (ANTACID ANTI-GAS PO) Take 1 tablet by mouth daily as needed (for gas).     Ascorbic Acid (VITAMIN C) 1000 MG tablet Take 1,000 mg by mouth 2 (two) times daily.     aspirin EC 81 MG tablet Take 81 mg by mouth daily. Swallow whole.     aspirin-acetaminophen-caffeine (EXCEDRIN MIGRAINE) 250-250-65 MG tablet Take 2 tablets by mouth 2 (two) times daily as needed for headache or migraine.     b complex vitamins tablet Take 1 tablet by mouth daily.     bismuth subsalicylate (PEPTO-BISMOL) 262 MG/15ML suspension Take 30 mLs by mouth 4 (four) times daily -  before meals and at bedtime. 360 mL 0   budesonide-formoterol (SYMBICORT) 80-4.5 MCG/ACT inhaler Inhale 2 puffs into the lungs 2 (two) times daily. 1 each 3   Calcium Carbonate-Vitamin D (CALCIUM-D PO) Take 1,200 mg by mouth 2 (two) times daily.     cyclobenzaprine (FLEXERIL) 10 MG  tablet Take 1 tablet by mouth three times daily as needed for muscle spasm 30 tablet 0   dicyclomine (BENTYL) 20 MG tablet Take 1 tablet (20 mg total) by mouth 3 (three) times daily before meals. 90 tablet 2   ECHINACEA-GOLDEN SEAL PO Take 1 tablet by mouth 2 (two) times daily as needed (immune system boost).     EPINEPHrine 0.3 mg/0.3 mL IJ SOAJ injection INJECT 0.3ML INTO THE MUSCLE ONCE AS DIRECTED 2 each 0   furosemide (LASIX) 20 MG tablet Take 1 tablet (20 mg total) by mouth daily. 30 tablet 3   hydrochlorothiazide (HYDRODIURIL) 25 MG tablet Take 25 mg by mouth daily.     hydrOXYzine (VISTARIL) 25 MG capsule TAKE 1 CAPSULE BY MOUTH EVERY 8 HOURS AS NEEDED 90 capsule 0    lisinopril (ZESTRIL) 10 MG tablet Take 1 tablet (10 mg total) by mouth daily. 90 tablet 0   Magnesium 250 MG TABS Take 500 mg by mouth at bedtime. Dosage varies     Melatonin 10 MG TABS Take 1 tablet by mouth at bedtime.     meloxicam (MOBIC) 7.5 MG tablet Take 1 tablet (7.5 mg total) by mouth daily. 30 tablet 0   Multiple Vitamin (MULTIVITAMIN WITH MINERALS) TABS tablet Take 1 tablet by mouth daily. With Elderberry     Multiple Vitamins-Minerals (ZINC PO) Take 500 mg by mouth daily as needed (immune system boost).     nystatin (MYCOSTATIN/NYSTOP) powder Apply 1 Application topically 3 (three) times daily. 60 g 1   ondansetron (ZOFRAN) 4 MG tablet TAKE 1 TABLET BY MOUTH EVERY 8 HOURS AS NEEDED FOR NAUSEA FOR VOMITING 30 tablet 0   pantoprazole (PROTONIX) 40 MG tablet Take 1 tablet (40 mg total) by mouth daily. 30 tablet 2   predniSONE (DELTASONE) 10 MG tablet Take 4 tablets ( 40 mg total) by mouth for 3 days; take 3 tablets ( 30 mg total) by mouth for 3 days; take 2 tablets ( 20 mg) by mouth for 3 days; take 1 tablet ( 10 mg total) by mouth for 3 days. 30 tablet 0   propranolol ER (INDERAL LA) 80 MG 24 hr capsule Take 1 capsule (80 mg total) by mouth daily. 90 capsule 1   rosuvastatin (CRESTOR) 5 MG tablet Take 1 tablet (5 mg total) by mouth at bedtime. 90 tablet 0   sertraline (ZOLOFT) 100 MG tablet Take 1 tablet (100 mg total) by mouth daily. 90 tablet 2   traZODone (DESYREL) 100 MG tablet Take 1 tablet (100 mg total) by mouth at bedtime. 90 tablet 1   Ubrogepant (UBRELVY) 100 MG TABS Take 1 tablet by mouth at onset of headache. May repeat dose x 1 after 2 hours if needed. Max 200 mg/ 24 hours. 16 tablet 5   Vitamin D, Ergocalciferol, (DRISDOL) 1.25 MG (50000 UNIT) CAPS capsule 1 po q Wed and 1 po q Sun 8 capsule 0   vitamin k 100 MCG tablet Take 100 mcg by mouth daily.     No current facility-administered medications on file prior to visit.    ALLERGIES: Allergies  Allergen Reactions    Benzalkonium Chloride Anaphylaxis and Hives   Neosporin [Neomycin-Bacitracin Zn-Polymyx] Anaphylaxis and Hives   Polysporin [Bacitracin-Polymyxin B] Hives   Bee Venom Hives and Swelling    Throat swelling   Bupropion Hcl Other (See Comments)    angry   Cephalexin Hives and Swelling   Latex     FAMILY HISTORY: Family History  Problem Relation  Age of Onset   Diabetes Mother    High blood pressure Mother    High Cholesterol Mother    Depression Mother    Anxiety disorder Mother    Sleep apnea Mother    Obesity Mother    Heart attack Father 20   High blood pressure Father    High Cholesterol Father    Heart disease Father    Breast cancer Maternal Aunt 50   Ovarian cancer Neg Hx     Objective:  Blood pressure 139/69, pulse 79, height 5\' 7"  (1.702 m), weight (!) 366 lb (166 kg), last menstrual period 07/11/2016, SpO2 97%. General: No acute distress.  Patient appears well-groomed.   Head:  Normocephalic/atraumatic Eyes:  fundi examined but not visualized Neck: supple, no paraspinal tenderness, full range of motion Heart: regular rate and rhythm Neurological Exam: Mental status: alert and oriented to person, place, and time, speech fluent and not dysarthric, language intact. Cranial nerves: CN I: not tested CN II: pupils equal, round and reactive to light, visual fields intact CN III, IV, VI:  full range of motion, no nystagmus, no ptosis CN V: facial sensation intact. CN VII: upper and lower face symmetric CN VIII: hearing intact CN IX, X: gag intact, uvula midline CN XI: sternocleidomastoid and trapezius muscles intact CN XII: tongue midline Bulk & Tone: normal, no fasciculations. Motor:  muscle strength 5/5 throughout Sensation:  Pinprick, temperature and vibratory sensation intact. Deep Tendon Reflexes:  2+ throughout,  toes downgoing.   Finger to nose testing:  Without dysmetria.   Gait:  Normal station and stride.  Romberg negative.    Thank you for allowing me  to take part in the care of this patient.  Shon Millet, DO  CC: Kara Dies, NP

## 2023-05-15 ENCOUNTER — Encounter: Payer: Self-pay | Admitting: Neurology

## 2023-05-15 ENCOUNTER — Ambulatory Visit: Payer: BC Managed Care – PPO | Admitting: Neurology

## 2023-05-15 VITALS — BP 139/69 | HR 79 | Ht 67.0 in | Wt 366.0 lb

## 2023-05-15 DIAGNOSIS — G43E11 Chronic migraine with aura, intractable, with status migrainosus: Secondary | ICD-10-CM

## 2023-05-15 MED ORDER — QULIPTA 60 MG PO TABS: 60.0000 mg | ORAL_TABLET | Freq: Every day | ORAL | 11 refills | Status: DC

## 2023-05-15 NOTE — Patient Instructions (Signed)
  Check MRI of brain with and without contrast Start Qulipta 60mg  daily.   Take Ubrelvy at earliest onset of headache.  May repeat dose once in 2 hours if needed.  Maximum 2 tablets in 24 hours.  Use ondansetron for nausea at earliest onset of migraine Limit use of pain relievers to no more than 2 days out of the week.  These medications include acetaminophen, NSAIDs (ibuprofen/Advil/Motrin, naproxen/Aleve, triptans (Imitrex/sumatriptan), Excedrin, and narcotics.  This will help reduce risk of rebound headaches. Be aware of common food triggers Routine exercise Stay adequately hydrated (aim for 64 oz water daily) Keep headache diary Maintain proper stress management Maintain proper sleep hygiene Do not skip meals Consider supplements:  magnesium citrate 400mg  daily, riboflavin 400mg  daily, coenzyme Q10 300mg  daily

## 2023-05-19 ENCOUNTER — Other Ambulatory Visit (INDEPENDENT_AMBULATORY_CARE_PROVIDER_SITE_OTHER): Payer: Self-pay

## 2023-05-19 ENCOUNTER — Ambulatory Visit: Payer: BC Managed Care – PPO | Admitting: Orthopedic Surgery

## 2023-05-19 DIAGNOSIS — G8929 Other chronic pain: Secondary | ICD-10-CM

## 2023-05-19 DIAGNOSIS — M25512 Pain in left shoulder: Secondary | ICD-10-CM

## 2023-05-20 ENCOUNTER — Encounter: Payer: Self-pay | Admitting: Cardiovascular Disease

## 2023-05-20 ENCOUNTER — Telehealth: Payer: Self-pay

## 2023-05-20 ENCOUNTER — Ambulatory Visit: Payer: BC Managed Care – PPO | Attending: Cardiovascular Disease | Admitting: Cardiovascular Disease

## 2023-05-20 VITALS — BP 118/70 | HR 81 | Ht 67.0 in | Wt 366.1 lb

## 2023-05-20 DIAGNOSIS — I10 Essential (primary) hypertension: Secondary | ICD-10-CM | POA: Diagnosis not present

## 2023-05-20 DIAGNOSIS — R011 Cardiac murmur, unspecified: Secondary | ICD-10-CM

## 2023-05-20 DIAGNOSIS — E785 Hyperlipidemia, unspecified: Secondary | ICD-10-CM | POA: Diagnosis not present

## 2023-05-20 NOTE — Telephone Encounter (Signed)
Qulipta PA needed  

## 2023-05-20 NOTE — Patient Instructions (Signed)
Medication Instructions:  No changes *If you need a refill on your cardiac medications before your next appointment, please call your pharmacy*   Lab Work: None ordered If you have labs (blood work) drawn today and your tests are completely normal, you will receive your results only by: MyChart Message (if you have MyChart) OR A paper copy in the mail If you have any lab test that is abnormal or we need to change your treatment, we will call you to review the results.   Testing/Procedures: Your physician has requested that you have an echocardiogram. Echocardiography is a painless test that uses sound waves to create images of your heart. It provides your doctor with information about the size and shape of your heart and how well your heart's chambers and valves are working.   You may receive an ultrasound enhancing agent through an IV if needed to better visualize your heart during the echo. This procedure takes approximately one hour.  There are no restrictions for this procedure.  This will take place at [address] [facility] [address], [place] [address]  Please note: We ask at that you not bring children with you during ultrasound (echo/ vascular) testing. Due to room size and safety concerns, children are not allowed in the ultrasound rooms during exams. Our front office staff cannot provide observation of children in our lobby area while testing is being conducted. An adult accompanying a patient to their appointment will only be allowed in the ultrasound room at the discretion of the ultrasound technician under special circumstances. We apologize for any inconvenience.    Follow-Up: At [facility], you and your health needs are our priority.  As part of our continuing mission to provide you with exceptional heart care, we have created designated Provider Care Teams.  These Care Teams include your primary Cardiologist (physician) and Advanced Practice  Providers (APPs -  Physician Assistants and Nurse Practitioners) who all work together to provide you with the care you need, when you need it.  We recommend signing up for the patient portal called "MyChart".  Sign up information is provided on this After Visit Summary.  MyChart is used to connect with patients for Virtual Visits (Telemedicine).  Patients are able to view lab/test results, encounter notes, upcoming appointments, etc.  Non-urgent messages can be sent to your provider as well.   To learn more about what you can do with MyChart, go to [email or web address].    Your next appointment:   6 month(s)  Provider:   You may see Dr. Kirke Corin or one of the following Advanced Practice Providers on your designated Care Team:   Nicolasa Ducking, NP Eula Listen, PA-C Cadence Fransico Michael, PA-C Charlsie Quest, NP Carlos Levering, NP

## 2023-05-20 NOTE — Progress Notes (Signed)
Cardiology Office Note   Date:  05/20/2023   ID:  Danielle Irwin, DOB July 06, 1970, MRN [id number]  PCP:  Kara Dies, NP  Cardiologist:   Lorine Bears, MD   Chief Complaint  Patient presents with   New Patient (Initial Visit)    Fluctuating BP c/o chest pain. Meds reviewed verbally with pt.      History of Present Illness: Danielle Irwin is a 52 y.o. female who was referred for evaluation of labile hypertension.  She has known history of morbid obesity with a BMI greater than 50, essential hypertension, hyperlipidemia, fibromyalgia, migraines and sleep apnea. She has been told about a heart murmur for a long time and had an echocardiogram done in 2016 which showed mildly calcified aortic valve with mild aortic stenosis.  She is not aware of bicuspid aortic valve and the leaflets were not well-seen on that echo. She denies chest pain or worsening dyspnea but she has been having issues with labile blood pressure with high readings followed by low normal readings.  This is usually associated with dizziness.  She was placed on propranolol recently for migraines and noticed improvement in blood pressure control.  She also takes lisinopril and hydrochlorothiazide. She is not a smoker.  She has family history of coronary artery disease.  Both her parents are a patient of mine.  Unfortunately, her father died recently due to progression of Parkinson's.    Past Medical History:  Diagnosis Date   Anemia    Anxiety    Asthma    Back pain    Depression    Edema of both lower extremities    Epstein Barr infection    Family history of adverse reaction to anesthesia    FATHER WAS SLOW TO WAKE UP   Fibromyalgia    GERD (gastroesophageal reflux disease)    H/O blood clots    Headache    OCCASIONAL MIGRAINES   History of fibromyalgia    HNP (herniated nucleus pulposus), lumbar    Hx of epistaxis    Hypertension    Insomnia    Joint pain    OA (osteoarthritis)    Obesity     PCOS (polycystic ovarian syndrome)    Sensitive skin    Sleep apnea    Vitamin D deficiency     Past Surgical History:  Procedure Laterality Date   BREAST BIOPSY Left 09/17/2022   Stereo bx, Ribbon Clip, path pending   BREAST BIOPSY Left 09/17/2022   MM LT BREAST BX W LOC DEV 1ST LESION IMAGE BX SPEC STEREO GUIDE 09/17/2022 [facility]   COLONOSCOPY WITH PROPOFOL N/A 09/09/2022   Procedure: COLONOSCOPY WITH PROPOFOL;  Surgeon: Wyline Mood, MD;  Location: [facility] ENDOSCOPY;  Service: Gastroenterology;  Laterality: N/A;   LUMBAR LAMINECTOMY/DECOMPRESSION MICRODISCECTOMY Right 06/02/2014   Procedure: MICRO LUMBAR DECOMPRESSION L4 - L5 ON THE RIGHT 1 LEVEL;  Surgeon: Javier Docker, MD;  Location: [facility];  Service: Orthopedics;  Laterality: Right;   ORIF HUMERUS FRACTURE Left 08/01/2017   Procedure: OPEN REDUCTION INTERNAL FIXATION (ORIF) PROXIMAL HUMERUS FRACTURE;  Surgeon: Roby Lofts, MD;  Location: [facility] OR;  Service: Orthopedics;  Laterality: Left;   WISDOM TOOTH EXTRACTION       Current Outpatient Medications  Medication Sig Dispense Refill   acetaminophen (TYLENOL) 650 MG CR tablet Take 650 mg by mouth every 8 (eight) hours as needed for pain.     albuterol (VENTOLIN HFA) 108 (90 Base) MCG/ACT inhaler Inhale 2 puffs into  the lungs every 6 (six) hours as needed for wheezing or shortness of breath. 8 g 2   Alum & Mag Hydroxide-Simeth (ANTACID ANTI-GAS PO) Take 1 tablet by mouth daily as needed (for gas).     Ascorbic Acid (VITAMIN C) 1000 MG tablet Take 1,000 mg by mouth 2 (two) times daily.     aspirin EC 81 MG tablet Take 81 mg by mouth daily. Swallow whole.     aspirin-acetaminophen-caffeine (EXCEDRIN MIGRAINE) 250-250-65 MG tablet Take 2 tablets by mouth 2 (two) times daily as needed for headache or migraine.     Atogepant (QULIPTA) 60 MG TABS Take 1 tablet (60 mg total) by mouth daily. 30 tablet 11   b complex vitamins tablet Take 1 tablet by mouth daily.     bismuth  subsalicylate (PEPTO-BISMOL) 262 MG/15ML suspension Take 30 mLs by mouth 4 (four) times daily -  before meals and at bedtime. 360 mL 0   budesonide-formoterol (SYMBICORT) 80-4.5 MCG/ACT inhaler Inhale 2 puffs into the lungs 2 (two) times daily. 1 each 3   Calcium Carbonate-Vitamin D (CALCIUM-D PO) Take 1,200 mg by mouth 2 (two) times daily.     cyclobenzaprine (FLEXERIL) 10 MG tablet Take 1 tablet by mouth three times daily as needed for muscle spasm 30 tablet 0   dicyclomine (BENTYL) 20 MG tablet Take 1 tablet (20 mg total) by mouth 3 (three) times daily before meals. 90 tablet 2   ECHINACEA-GOLDEN SEAL PO Take 1 tablet by mouth 2 (two) times daily as needed (immune system boost).     EPINEPHrine 0.3 mg/0.3 mL IJ SOAJ injection INJECT 0.3ML INTO THE MUSCLE ONCE AS DIRECTED 2 each 0   furosemide (LASIX) 20 MG tablet Take 1 tablet (20 mg total) by mouth daily. 30 tablet 3   hydrochlorothiazide (HYDRODIURIL) 25 MG tablet Take 25 mg by mouth daily.     hydrOXYzine (VISTARIL) 25 MG capsule TAKE 1 CAPSULE BY MOUTH EVERY 8 HOURS AS NEEDED 90 capsule 0   lisinopril (ZESTRIL) 10 MG tablet Take 1 tablet (10 mg total) by mouth daily. 90 tablet 0   Magnesium 250 MG TABS Take 500 mg by mouth at bedtime. Dosage varies     Melatonin 10 MG TABS Take 1 tablet by mouth at bedtime.     meloxicam (MOBIC) 7.5 MG tablet Take 1 tablet (7.5 mg total) by mouth daily. 30 tablet 0   Multiple Vitamin (MULTIVITAMIN WITH MINERALS) TABS tablet Take 1 tablet by mouth daily. With Elderberry     Multiple Vitamins-Minerals (ZINC PO) Take 500 mg by mouth daily as needed (immune system boost).     nystatin (MYCOSTATIN/NYSTOP) powder Apply 1 Application topically 3 (three) times daily. 60 g 1   ondansetron (ZOFRAN) 4 MG tablet TAKE 1 TABLET BY MOUTH EVERY 8 HOURS AS NEEDED FOR NAUSEA FOR VOMITING 30 tablet 0   pantoprazole (PROTONIX) 40 MG tablet Take 1 tablet (40 mg total) by mouth daily. 30 tablet 2   predniSONE (DELTASONE) 10 MG  tablet Take 4 tablets ( 40 mg total) by mouth for 3 days; take 3 tablets ( 30 mg total) by mouth for 3 days; take 2 tablets ( 20 mg) by mouth for 3 days; take 1 tablet ( 10 mg total) by mouth for 3 days. 30 tablet 0   propranolol ER (INDERAL LA) 80 MG 24 hr capsule Take 1 capsule (80 mg total) by mouth daily. 90 capsule 1   rosuvastatin (CRESTOR) 5 MG tablet Take 1 tablet (5  mg total) by mouth at bedtime. 90 tablet 0   sertraline (ZOLOFT) 100 MG tablet Take 1 tablet (100 mg total) by mouth daily. 90 tablet 2   traZODone (DESYREL) 100 MG tablet Take 1 tablet (100 mg total) by mouth at bedtime. 90 tablet 1   Ubrogepant (UBRELVY) 100 MG TABS Take 1 tablet by mouth at onset of headache. May repeat dose x 1 after 2 hours if needed. Max 200 mg/ 24 hours. 16 tablet 5   Vitamin D, Ergocalciferol, (DRISDOL) 1.25 MG (50000 UNIT) CAPS capsule 1 po q Wed and 1 po q Sun 8 capsule 0   vitamin k 100 MCG tablet Take 100 mcg by mouth daily.     No current facility-administered medications for this visit.    Allergies:   Benzalkonium chloride, Neosporin [neomycin-bacitracin zn-polymyx], Polysporin [bacitracin-polymyxin b], Bee venom, Bupropion hcl, Cephalexin, and Latex    Social History:  The patient  reports that she quit smoking about 33 years ago. Her smoking use included cigarettes. She has never used smokeless tobacco. She reports current alcohol use. She reports that she does not use drugs.   Family History:  The patient's family history includes Anxiety disorder in her mother; Breast cancer (age of onset: 29) in her maternal aunt; Depression in her mother; Diabetes in her mother; Heart attack in her brother; Heart attack (age of onset: 26) in her father; Heart disease in her father; High Cholesterol in her father and mother; High blood pressure in her father and mother; Obesity in her mother; Sleep apnea in her mother.    ROS:  Please see the history of present illness.   Otherwise, review of systems are  positive for none.   All other systems are reviewed and negative.    PHYSICAL EXAM: VS:  BP 118/70 (BP Location: Right Wrist, Patient Position: Sitting, Cuff Size: Large)   Pulse 81   Ht 5\' 7"  (1.702 m)   Wt (!) 366 lb 2 oz (166.1 kg)   LMP 07/11/2016   SpO2 98%   BMI 57.34 kg/m  , BMI Body mass index is 57.34 kg/m. GEN: Well nourished, well developed, in no acute distress  HEENT: normal  Neck: no JVD, carotid bruits, or masses Cardiac: RRR; no rubs, or gallops,no edema .  2 out of 6 systolic murmur in the aortic area which is mid peaking. Respiratory:  clear to auscultation bilaterally, normal work of breathing GI: soft, nontender, nondistended, + BS MS: no deformity or atrophy  Skin: warm and dry, no rash Neuro:  Strength and sensation are intact Psych: euthymic mood, full affect   EKG:  EKG is ordered today. The ekg ordered today demonstrates : Normal sinus rhythm Minimal voltage criteria for LVH, may be normal variant ( R in aVL ) When compared with ECG of 01-Aug-2017 05:34, No significant change was found    Recent Labs: 03/28/2023: ALT 27; BUN 14; Creatinine, Ser 0.81; Potassium 4.0; Sodium 139; TSH 2.85 04/23/2023: Hemoglobin 14.1; Platelets 259.0    Lipid Panel    Component Value Date/Time   CHOL 245 (H) 02/11/2023 1026   TRIG 138 02/11/2023 1026   HDL 50 02/11/2023 1026   CHOLHDL 5 07/18/2022 0925   VLDL 18.6 07/18/2022 0925   LDLCALC 170 (H) 02/11/2023 1026   LDLDIRECT 129.8 08/21/2010 0911      Wt Readings from Last 3 Encounters:  05/20/23 (!) 366 lb 2 oz (166.1 kg)  05/15/23 (!) 366 lb (166 kg)  05/13/23 (!) 360 lb (  163.3 kg)          05/20/2023    2:08 PM  PAD Screen  Previous PAD dx? Yes  Previous surgical procedure? No  Pain with walking? No  Feet/toe relief with dangling? No  Painful, non-healing ulcers? No  Extremities discolored? No      ASSESSMENT AND PLAN:  1.  Cardiac murmur suggestive of aortic stenosis: Bicuspid aortic  valve is a possibility considering her age.  Most recent echocardiogram in 2016 showed mild stenosis.  I requested a follow-up echocardiogram.  2.  Essential hypertension: She reports labile blood pressure but there has been improvement since the addition of propranolol for migraines.  3.  Morbid obesity: We discussed the importance of weight loss.  She is already established at the weight loss clinic.  She likely benefit from GLP 1 medications.    Disposition:   FU with me in 6 months  Signed,  Lorine Bears, MD  05/20/2023 2:19 PM    [facility]

## 2023-05-21 ENCOUNTER — Encounter: Payer: Self-pay | Admitting: Orthopedic Surgery

## 2023-05-21 DIAGNOSIS — G8929 Other chronic pain: Secondary | ICD-10-CM

## 2023-05-21 DIAGNOSIS — M25512 Pain in left shoulder: Secondary | ICD-10-CM | POA: Diagnosis not present

## 2023-05-21 MED ORDER — METHYLPREDNISOLONE ACETATE 40 MG/ML IJ SUSP
40.0000 mg | INTRAMUSCULAR | Status: AC | PRN
  Administered 2023-05-21: 40 mg via INTRA_ARTICULAR

## 2023-05-21 MED ORDER — LIDOCAINE HCL 1 % IJ SOLN
5.0000 mL | INTRAMUSCULAR | Status: AC | PRN
  Administered 2023-05-21: 5 mL

## 2023-05-21 NOTE — Progress Notes (Signed)
Office Visit Note   Patient: Danielle Irwin           Date of Birth: 26-Nov-1970           MRN: [id number] Visit Date: 05/19/2023              Requested by: Kara Dies, NP [address] [place],  [place] [address] PCP: Kara Dies, NP  Chief Complaint  Patient presents with   Left Shoulder - Pain    Hx ORIF left humeral fx with Dr. Jena Gauss 07/2017      HPI: Patient is a 52 year old woman who was seen with decreased range of motion and pain of her left shoulder.  She is status post open reduction internal fixation with Dr. Jena Gauss February 2019.  Assessment & Plan: Visit Diagnoses:  1. Chronic left shoulder pain     Plan: Left shoulder was injected in the subacromial space.  Patient had relief after the injection.  Follow-Up Instructions: Return in about 4 weeks (around 06/16/2023).   Ortho Exam  Patient is alert, oriented, no adenopathy, well-dressed, normal affect, normal respiratory effort. Examination patient has active and passive abduction and flexion to 90 degrees.  She has pain with adhesive capsulitis and has pain with Neer and Hawkins impingement test.  Imaging: No results found. No images are attached to the encounter.  Labs: Lab Results  Component Value Date   HGBA1C 5.7 (H) 02/11/2023   HGBA1C 6.0 07/18/2022   HGBA1C 5.7 12/24/2016   ESRSEDRATE 40 (H) 10/26/2014     Lab Results  Component Value Date   ALBUMIN 4.3 03/28/2023   ALBUMIN 4.4 02/11/2023   ALBUMIN 4.6 12/19/2022    No results found for: "MG" Lab Results  Component Value Date   VD25OH 29.8 (L) 02/11/2023   VD25OH 17.54 (L) 07/18/2022   VD25OH 26.79 (L) 05/12/2018    No results found for: "PREALBUMIN"    Latest Ref Rng & Units 04/23/2023    2:38 PM 03/28/2023   12:07 PM 02/11/2023   10:26 AM  CBC EXTENDED  WBC 4.0 - 10.5 K/uL 7.7  6.3  CANCELED   RBC 3.87 - 5.11 Mil/uL 4.51  4.57  CANCELED   Hemoglobin 12.0 - 15.0 g/dL 69.6  29.5  CANCELED   HCT 36.0 -  46.0 % 42.4  43.1  CANCELED   Platelets 150.0 - 400.0 K/uL 259.0  275.0  CANCELED   NEUT# 1.4 - 7.7 K/uL 4.5  3.5    Lymph# 0.7 - 4.0 K/uL 2.4  2.3  CANCELED      There is no height or weight on file to calculate BMI.  Orders:  Orders Placed This Encounter  Procedures   XR Shoulder Left   No orders of the defined types were placed in this encounter.    Procedures: Large Joint Inj: L subacromial bursa on 05/21/2023 10:24 AM Indications: diagnostic evaluation and pain Details: 22 G 1.5 in needle, posterior approach  Arthrogram: No  Medications: 5 mL lidocaine 1 %; 40 mg methylPREDNISolone acetate 40 MG/ML Outcome: tolerated well, no immediate complications Procedure, treatment alternatives, risks and benefits explained, specific risks discussed. Consent was given by the patient. Immediately prior to procedure a time out was called to verify the correct patient, procedure, equipment, support staff and site/side marked as required. Patient was prepped and draped in the usual sterile fashion.      Clinical Data: No additional findings.  ROS:  All other systems negative, except as noted in  the HPI. Review of Systems  Objective: Vital Signs: LMP 07/11/2016   Specialty Comments:  EXAM: MRI LUMBAR SPINE WITHOUT CONTRAST   TECHNIQUE: Multiplanar, multisequence MR imaging of the lumbar spine was performed. No intravenous contrast was administered.   COMPARISON:  02/21/2018   FINDINGS: Segmentation:  Standard.   Alignment:  Physiologic.   Vertebrae: No acute fracture, evidence of discitis, or aggressive bone lesion.   Conus medullaris and cauda equina: Conus extends to the L1 level. Conus and cauda equina appear normal.   Paraspinal and other soft tissues: No acute paraspinal abnormality.   Disc levels:   Disc spaces: Degenerative disease with disc height loss at L3-4, L4-5 and L5-S1. Disc desiccation at L2-3.   T12-L1: No significant disc bulge. No neural  foraminal stenosis. No central canal stenosis.   L1-L2: No significant disc bulge. Mild bilateral facet arthropathy. No foraminal or central canal stenosis.   L2-L3: No significant disc bulge. Mild bilateral facet arthropathy. No foraminal or central canal stenosis.   L3-L4: Mild broad-based disc bulge. Mild bilateral facet arthropathy. Mild spinal stenosis. Right lateral recess stenosis. No foraminal stenosis.   L4-L5: Mild broad-based disc bulge. Mild bilateral facet arthropathy. No spinal stenosis. No foraminal stenosis.   L5-S1: Broad-based disc bulge with a broad left paracentral disc protrusion and contacting the left intraspinal S1 nerve root. Mild bilateral facet arthropathy. Mild left foraminal stenosis. No right foraminal stenosis. No spinal stenosis.   IMPRESSION: 1. At L5-S1 there is a broad-based disc bulge with a broad left paracentral disc protrusion and contacting the left intraspinal S1 nerve root. Mild bilateral facet arthropathy. Mild left foraminal stenosis. 2. At L3-4 there is a mild broad-based disc bulge. Mild bilateral facet arthropathy. Mild spinal stenosis. Right lateral recess stenosis. 3. No acute osseous injury of the lumbar spine.     Electronically Signed   By: Elige Ko M.D.   On: 10/25/2022 14:17  PMFS History: Patient Active Problem List   Diagnosis Date Noted   Intractable migraine without aura and with status migrainosus 04/08/2023   Fluctuating blood pressure 03/28/2023   Lightheadedness 03/28/2023   Wheezing 03/11/2023   Candidiasis, cutaneous 03/10/2023   Respiratory illness 03/05/2023   Prediabetes 02/11/2023   OSA (obstructive sleep apnea) 02/11/2023   Physically inactive 02/11/2023   Diarrhea 02/09/2023   Tick bite of left lower leg 02/09/2023   Nausea and vomiting 12/19/2022   Gastroenteritis 09/26/2022   Acute pain of left foot 08/27/2022   Pain in joint, ankle and foot 08/22/2022   Elevated liver function tests  08/14/2022   Hyperlipidemia 08/14/2022   Migraine without status migrainosus, not intractable 08/14/2022   Vitamin D deficiency 08/08/2022   Low back pain 02/01/2021   Low back strain 02/01/2021   Strain of neck muscle 02/01/2021   Bronchitis 03/25/2019   Sinus infection 10/26/2018   Suspected COVID-19 virus infection 10/08/2018   Influenza-like illness 09/28/2018   Fibromyalgia 05/12/2018   Edema 05/12/2018   BMI 60.0-69.9, adult (HCC) 05/12/2018   DDD (degenerative disc disease), thoracolumbar 01/06/2018   DVT (deep venous thrombosis) (HCC) 08/29/2017   Leukocytosis 08/06/2017   Humerus fracture 08/01/2017   CPAP (continuous positive airway pressure) dependence 08/01/2017   Asthma 08/01/2017   HTN (hypertension) 06/24/2017   Anxiety and depression 11/07/2015   Systolic murmur 11/11/2014   Other fatigue 10/26/2014   Morbid obesity (HCC) 10/26/2014   Insomnia 06/21/2014   Anxiety 06/21/2014   Spinal stenosis at L4-L5 level 06/02/2014   SOB (shortness of  breath) on exertion 08/13/2010   POLYCYSTIC OVARIAN DISEASE 06/19/2010   Past Medical History:  Diagnosis Date   Anemia    Anxiety    Asthma    Back pain    Depression    Edema of both lower extremities    Epstein Barr infection    Family history of adverse reaction to anesthesia    FATHER WAS SLOW TO WAKE UP   Fibromyalgia    GERD (gastroesophageal reflux disease)    H/O blood clots    Headache    OCCASIONAL MIGRAINES   History of fibromyalgia    HNP (herniated nucleus pulposus), lumbar    Hx of epistaxis    Hypertension    Insomnia    Joint pain    OA (osteoarthritis)    Obesity    PCOS (polycystic ovarian syndrome)    Sensitive skin    Sleep apnea    Vitamin D deficiency     Family History  Problem Relation Age of Onset   Diabetes Mother    High blood pressure Mother    High Cholesterol Mother    Depression Mother    Anxiety disorder Mother    Sleep apnea Mother    Obesity Mother    Heart attack  Father 57   High blood pressure Father    High Cholesterol Father    Heart disease Father    Heart attack Brother    Breast cancer Maternal Aunt 50   Ovarian cancer Neg Hx     Past Surgical History:  Procedure Laterality Date   BREAST BIOPSY Left 09/17/2022   Stereo bx, Ribbon Clip, path pending   BREAST BIOPSY Left 09/17/2022   MM LT BREAST BX W LOC DEV 1ST LESION IMAGE BX SPEC STEREO GUIDE 09/17/2022 [facility]   COLONOSCOPY WITH PROPOFOL N/A 09/09/2022   Procedure: COLONOSCOPY WITH PROPOFOL;  Surgeon: Wyline Mood, MD;  Location: [facility] ENDOSCOPY;  Service: Gastroenterology;  Laterality: N/A;   LUMBAR LAMINECTOMY/DECOMPRESSION MICRODISCECTOMY Right 06/02/2014   Procedure: MICRO LUMBAR DECOMPRESSION L4 - L5 ON THE RIGHT 1 LEVEL;  Surgeon: Javier Docker, MD;  Location: [facility];  Service: Orthopedics;  Laterality: Right;   ORIF HUMERUS FRACTURE Left 08/01/2017   Procedure: OPEN REDUCTION INTERNAL FIXATION (ORIF) PROXIMAL HUMERUS FRACTURE;  Surgeon: Roby Lofts, MD;  Location: [facility] OR;  Service: Orthopedics;  Laterality: Left;   WISDOM TOOTH EXTRACTION     Social History   Occupational History   Occupation: debt collections  Tobacco Use   Smoking status: Former    Current packs/day: 0.00    Types: Cigarettes    Quit date: 05/26/1990    Years since quitting: 33.0   Smokeless tobacco: Never  Vaping Use   Vaping status: Never Used  Substance and Sexual Activity   Alcohol use: Yes    Comment: occ   Drug use: No   Sexual activity: Not Currently    Partners: Male    Birth control/protection: Post-menopausal

## 2023-05-27 ENCOUNTER — Ambulatory Visit: Payer: BC Managed Care – PPO | Admitting: Nurse Practitioner

## 2023-05-27 ENCOUNTER — Encounter: Payer: Self-pay | Admitting: Nurse Practitioner

## 2023-05-27 VITALS — BP 132/84 | HR 112 | Temp 98.0°F | Ht 67.0 in | Wt 355.2 lb

## 2023-05-27 DIAGNOSIS — R059 Cough, unspecified: Secondary | ICD-10-CM | POA: Diagnosis not present

## 2023-05-27 DIAGNOSIS — J0140 Acute pansinusitis, unspecified: Secondary | ICD-10-CM | POA: Diagnosis not present

## 2023-05-27 DIAGNOSIS — R52 Pain, unspecified: Secondary | ICD-10-CM | POA: Diagnosis not present

## 2023-05-27 DIAGNOSIS — G43009 Migraine without aura, not intractable, without status migrainosus: Secondary | ICD-10-CM

## 2023-05-27 LAB — POC COVID19 BINAXNOW: SARS Coronavirus 2 Ag: NEGATIVE

## 2023-05-27 LAB — POCT INFLUENZA A/B
Influenza A, POC: NEGATIVE
Influenza B, POC: NEGATIVE

## 2023-05-27 MED ORDER — UBRELVY 100 MG PO TABS: ORAL_TABLET | ORAL | 1 refills | Status: DC

## 2023-05-27 MED ORDER — DOXYCYCLINE HYCLATE 100 MG PO TABS: 100.0000 mg | ORAL_TABLET | Freq: Two times a day (BID) | ORAL | 0 refills | Status: AC

## 2023-05-27 NOTE — Assessment & Plan Note (Signed)
Low back pain will treat with prednisone tapering and Meloxican. Advised pt to use warm compress. Pt followed by ortho. Will continue to monitor.

## 2023-05-27 NOTE — Patient Instructions (Signed)

## 2023-05-27 NOTE — Progress Notes (Signed)
Established Patient Office Visit  Subjective:  Patient ID: Danielle Irwin, female    DOB: [birth year]  Age: 52 y.o. MRN: [id number]  CC:  Chief Complaint  Patient presents with   Acute Visit    Migraine, nausea, sinus pain, sore throat, chills/ hot flashes,neck pain & ear ache    HPI  Danielle Irwin presents for acute visit. She has been experiencing URI symptoms with migraine. The symptoms started on 12/13/ has ST, chills, hot flashes, neck pain, R ear pain and body aches, sinus pressure and jaw pain.  Symptoms has been worsening since 12/13/  Medication: Dayquil and Nyquil    HPI   Past Medical History:  Diagnosis Date   Anemia    Anxiety    Asthma    Back pain    Depression    Edema of both lower extremities    Epstein Barr infection    Family history of adverse reaction to anesthesia    FATHER WAS SLOW TO WAKE UP   Fibromyalgia    GERD (gastroesophageal reflux disease)    H/O blood clots    Headache    OCCASIONAL MIGRAINES   History of fibromyalgia    HNP (herniated nucleus pulposus), lumbar    Hx of epistaxis    Hypertension    Insomnia    Joint pain    OA (osteoarthritis)    Obesity    PCOS (polycystic ovarian syndrome)    Sensitive skin    Sleep apnea    Vitamin D deficiency     Past Surgical History:  Procedure Laterality Date   BREAST BIOPSY Left 09/17/2022   Stereo bx, Ribbon Clip, path pending   BREAST BIOPSY Left 09/17/2022   MM LT BREAST BX W LOC DEV 1ST LESION IMAGE BX SPEC STEREO GUIDE 09/17/2022 [facility]   COLONOSCOPY WITH PROPOFOL N/A 09/09/2022   Procedure: COLONOSCOPY WITH PROPOFOL;  Surgeon: Wyline Mood, MD;  Location: [facility];  Service: Gastroenterology;  Laterality: N/A;   LUMBAR LAMINECTOMY/DECOMPRESSION MICRODISCECTOMY Right 06/02/2014   Procedure: MICRO LUMBAR DECOMPRESSION L4 - L5 ON THE RIGHT 1 LEVEL;  Surgeon: Javier Docker, MD;  Location: [facility];  Service: Orthopedics;  Laterality: Right;   ORIF HUMERUS FRACTURE  Left 08/01/2017   Procedure: OPEN REDUCTION INTERNAL FIXATION (ORIF) PROXIMAL HUMERUS FRACTURE;  Surgeon: Roby Lofts, MD;  Location: [facility];  Service: Orthopedics;  Laterality: Left;   WISDOM TOOTH EXTRACTION      Family History  Problem Relation Age of Onset   Diabetes Mother    High blood pressure Mother    High Cholesterol Mother    Depression Mother    Anxiety disorder Mother    Sleep apnea Mother    Obesity Mother    Heart attack Father 44   High blood pressure Father    High Cholesterol Father    Heart disease Father    Heart attack Brother    Breast cancer Maternal Aunt 50   Ovarian cancer Neg Hx     Social History   Socioeconomic History   Marital status: Single    Spouse name: Not on file   Number of children: Not on file   Years of education: Not on file   Highest education level: Some college, no degree  Occupational History   Occupation: debt collections  Tobacco Use   Smoking status: Former    Current packs/day: 0.00    Types: Cigarettes    Quit date: 05/26/1990    Years since  quitting: 33.0   Smokeless tobacco: Never  Vaping Use   Vaping status: Never Used  Substance and Sexual Activity   Alcohol use: Yes    Comment: occ   Drug use: No   Sexual activity: Not Currently    Partners: Male    Birth control/protection: Post-menopausal  Other Topics Concern   Not on file  Social History Narrative   Not on file   Social Drivers of Health   Financial Resource Strain: Patient Declined (05/27/2023)   Overall Financial Resource Strain (CARDIA)    Difficulty of Paying Living Expenses: Patient declined  Recent Concern: Financial Resource Strain - Medium Risk (03/26/2023)   Overall Financial Resource Strain (CARDIA)    Difficulty of Paying Living Expenses: Somewhat hard  Food Insecurity: Patient Declined (05/27/2023)   Hunger Vital Sign    Worried About Running Out of Food in the Last Year: Patient declined    Ran Out of Food in the Last Year:  Patient declined  Recent Concern: Food Insecurity - Food Insecurity Present (03/26/2023)   Hunger Vital Sign    Worried About Running Out of Food in the Last Year: Sometimes true    Ran Out of Food in the Last Year: Sometimes true  Transportation Needs: No Transportation Needs (05/27/2023)   PRAPARE - Administrator, Civil Service (Medical): No    Lack of Transportation (Non-Medical): No  Physical Activity: Unknown (05/27/2023)   Exercise Vital Sign    Days of Exercise per Week: 0 days    Minutes of Exercise per Session: Not on file  Stress: Stress Concern Present (05/27/2023)   [employer] of Occupational Health - Occupational Stress Questionnaire    Feeling of Stress : Rather much  Social Connections: Unknown (05/27/2023)   Social Connection and Isolation Panel [NHANES]    Frequency of Communication with Friends and Family: Patient declined    Frequency of Social Gatherings with Friends and Family: Patient declined    Attends Religious Services: Patient declined    Database administrator or Organizations: No    Attends Engineer, structural: Not on file    Marital Status: Never married  Recent Concern: Social Connections - Moderately Isolated (03/26/2023)   Social Connection and Isolation Panel [NHANES]    Frequency of Communication with Friends and Family: Three times a week    Frequency of Social Gatherings with Friends and Family: Never    Attends Religious Services: 1 to 4 times per year    Active Member of [employer] or Organizations: No    Attends Engineer, structural: Not on file    Marital Status: Never married  Intimate Partner Violence: Not on file     Outpatient Medications Prior to Visit  Medication Sig Dispense Refill   acetaminophen (TYLENOL) 650 MG CR tablet Take 650 mg by mouth every 8 (eight) hours as needed for pain.     albuterol (VENTOLIN HFA) 108 (90 Base) MCG/ACT inhaler Inhale 2 puffs into the lungs every 6 (six) hours as  needed for wheezing or shortness of breath. 8 g 2   Alum & Mag Hydroxide-Simeth (ANTACID ANTI-GAS PO) Take 1 tablet by mouth daily as needed (for gas).     Ascorbic Acid (VITAMIN C) 1000 MG tablet Take 1,000 mg by mouth 2 (two) times daily.     aspirin EC 81 MG tablet Take 81 mg by mouth daily. Swallow whole.     aspirin-acetaminophen-caffeine (EXCEDRIN MIGRAINE) 250-250-65 MG tablet Take 2 tablets by  mouth 2 (two) times daily as needed for headache or migraine.     Atogepant (QULIPTA) 60 MG TABS Take 1 tablet (60 mg total) by mouth daily. 30 tablet 11   b complex vitamins tablet Take 1 tablet by mouth daily.     bismuth subsalicylate (PEPTO-BISMOL) 262 MG/15ML suspension Take 30 mLs by mouth 4 (four) times daily -  before meals and at bedtime. 360 mL 0   budesonide-formoterol (SYMBICORT) 80-4.5 MCG/ACT inhaler Inhale 2 puffs into the lungs 2 (two) times daily. 1 each 3   Calcium Carbonate-Vitamin D (CALCIUM-D PO) Take 1,200 mg by mouth 2 (two) times daily.     cyclobenzaprine (FLEXERIL) 10 MG tablet Take 1 tablet by mouth three times daily as needed for muscle spasm 30 tablet 0   dicyclomine (BENTYL) 20 MG tablet Take 1 tablet (20 mg total) by mouth 3 (three) times daily before meals. 90 tablet 2   ECHINACEA-GOLDEN SEAL PO Take 1 tablet by mouth 2 (two) times daily as needed (immune system boost).     EPINEPHrine 0.3 mg/0.3 mL IJ SOAJ injection INJECT 0.3ML INTO THE MUSCLE ONCE AS DIRECTED 2 each 0   furosemide (LASIX) 20 MG tablet Take 1 tablet (20 mg total) by mouth daily. 30 tablet 3   hydrochlorothiazide (HYDRODIURIL) 25 MG tablet Take 25 mg by mouth daily.     hydrOXYzine (VISTARIL) 25 MG capsule TAKE 1 CAPSULE BY MOUTH EVERY 8 HOURS AS NEEDED 90 capsule 0   lisinopril (ZESTRIL) 10 MG tablet Take 1 tablet (10 mg total) by mouth daily. 90 tablet 0   Magnesium 250 MG TABS Take 500 mg by mouth at bedtime. Dosage varies     Melatonin 10 MG TABS Take 1 tablet by mouth at bedtime.     meloxicam  (MOBIC) 7.5 MG tablet Take 1 tablet (7.5 mg total) by mouth daily. 30 tablet 0   Multiple Vitamin (MULTIVITAMIN WITH MINERALS) TABS tablet Take 1 tablet by mouth daily. With Elderberry     Multiple Vitamins-Minerals (ZINC PO) Take 500 mg by mouth daily as needed (immune system boost).     nystatin (MYCOSTATIN/NYSTOP) powder Apply 1 Application topically 3 (three) times daily. 60 g 1   ondansetron (ZOFRAN) 4 MG tablet TAKE 1 TABLET BY MOUTH EVERY 8 HOURS AS NEEDED FOR NAUSEA FOR VOMITING 30 tablet 0   pantoprazole (PROTONIX) 40 MG tablet Take 1 tablet (40 mg total) by mouth daily. 30 tablet 2   propranolol ER (INDERAL LA) 80 MG 24 hr capsule Take 1 capsule (80 mg total) by mouth daily. 90 capsule 1   rosuvastatin (CRESTOR) 5 MG tablet Take 1 tablet (5 mg total) by mouth at bedtime. 90 tablet 0   sertraline (ZOLOFT) 100 MG tablet Take 1 tablet (100 mg total) by mouth daily. 90 tablet 2   traZODone (DESYREL) 100 MG tablet Take 1 tablet (100 mg total) by mouth at bedtime. 90 tablet 1   Vitamin D, Ergocalciferol, (DRISDOL) 1.25 MG (50000 UNIT) CAPS capsule 1 po q Wed and 1 po q Sun 8 capsule 0   vitamin k 100 MCG tablet Take 100 mcg by mouth daily.     predniSONE (DELTASONE) 10 MG tablet Take 4 tablets ( 40 mg total) by mouth for 3 days; take 3 tablets ( 30 mg total) by mouth for 3 days; take 2 tablets ( 20 mg) by mouth for 3 days; take 1 tablet ( 10 mg total) by mouth for 3 days. 30 tablet 0  Ubrogepant (UBRELVY) 100 MG TABS Take 1 tablet by mouth at onset of headache. May repeat dose x 1 after 2 hours if needed. Max 200 mg/ 24 hours. 16 tablet 5   No facility-administered medications prior to visit.    Allergies  Allergen Reactions   Benzalkonium Chloride Anaphylaxis and Hives   Neosporin [Neomycin-Bacitracin Zn-Polymyx] Anaphylaxis and Hives   Polysporin [Bacitracin-Polymyxin B] Hives   Bee Venom Hives and Swelling    Throat swelling   Bupropion Hcl Other (See Comments)    angry    Cephalexin Hives and Swelling   Latex     ROS Review of Systems Negative unless indicated in HPI.    Objective:    Physical Exam Constitutional:      Appearance: She is obese.  HENT:     Right Ear: A middle ear effusion is present.     Left Ear: Tympanic membrane normal. Tympanic membrane is not erythematous.     Nose:     Right Turbinates: Not enlarged.     Left Turbinates: Not enlarged.     Right Sinus: Maxillary sinus tenderness and frontal sinus tenderness present.     Left Sinus: Maxillary sinus tenderness and frontal sinus tenderness present.     Mouth/Throat:     Mouth: Mucous membranes are moist.     Pharynx: No pharyngeal swelling, oropharyngeal exudate or posterior oropharyngeal erythema.     Tonsils: No tonsillar exudate.  Cardiovascular:     Rate and Rhythm: Rhythm irregular.     Heart sounds: Murmur heard.  Pulmonary:     Effort: Pulmonary effort is normal.     Breath sounds: Normal breath sounds. No stridor. No wheezing.  Skin:    General: Skin is warm.  Neurological:     General: No focal deficit present.     Mental Status: She is alert and oriented to person, place, and time. Mental status is at baseline.  Psychiatric:        Mood and Affect: Mood normal.        Behavior: Behavior normal.        Thought Content: Thought content normal.        Judgment: Judgment normal.     BP 132/84   Pulse (!) 112   Temp 98 F (36.7 C)   Ht 5\' 7"  (1.702 m)   Wt (!) 355 lb 3.2 oz (161.1 kg)   LMP 07/11/2016   SpO2 95%   BMI 55.63 kg/m  Wt Readings from Last 3 Encounters:  06/03/23 (!) 359 lb 9.6 oz (163.1 kg)  05/27/23 (!) 355 lb 3.2 oz (161.1 kg)  05/20/23 (!) 366 lb 2 oz (166.1 kg)     Health Maintenance  Topic Date Due   Hepatitis C Screening  Never done   Zoster Vaccines- Shingrix (1 of 2) Never done   COVID-19 Vaccine (1 - 2024-25 season) Never done   INFLUENZA VACCINE  09/08/2023 (Originally 01/09/2023)   MAMMOGRAM  09/05/2024   DTaP/Tdap/Td (2  - Td or Tdap) 06/25/2027   Cervical Cancer Screening (HPV/Pap Cotest)  09/02/2027   Colonoscopy  09/08/2032   HIV Screening  Completed   HPV VACCINES  Aged Out    There are no preventive care reminders to display for this patient.  Lab Results  Component Value Date   TSH 2.85 03/28/2023   Lab Results  Component Value Date   WBC 7.7 04/23/2023   HGB 14.1 04/23/2023   HCT 42.4 04/23/2023   MCV 94.0 04/23/2023  PLT 259.0 04/23/2023   Lab Results  Component Value Date   NA 139 03/28/2023   K 4.0 03/28/2023   CO2 27 03/28/2023   GLUCOSE 137 (H) 03/28/2023   BUN 14 03/28/2023   CREATININE 0.81 03/28/2023   BILITOT 0.8 03/28/2023   ALKPHOS 40 03/28/2023   AST 27 03/28/2023   ALT 27 03/28/2023   PROT 7.4 03/28/2023   ALBUMIN 4.3 03/28/2023   CALCIUM 9.4 03/28/2023   ANIONGAP 10 08/02/2017   EGFR 86 02/11/2023   GFR 83.33 03/28/2023   Lab Results  Component Value Date   CHOL 245 (H) 02/11/2023   Lab Results  Component Value Date   HDL 50 02/11/2023   Lab Results  Component Value Date   LDLCALC 170 (H) 02/11/2023   Lab Results  Component Value Date   TRIG 138 02/11/2023   Lab Results  Component Value Date   CHOLHDL 5 07/18/2022   Lab Results  Component Value Date   HGBA1C 5.7 (H) 02/11/2023      Assessment & Plan:  Acute non-recurrent pansinusitis Assessment & Plan: Servings of CardioMax COVID and flu negative. Will treat with doxycycline she has just completed prednisone tapering for back pain. Increase fluid intake, rest. Use steam/humidifier. Will let us know if symptoms not improving. Work note provided.    Cough, unspecified type -     POCT Influenza A/B -     POC COVID-19 BinaxNow  Body aches -     POCT Influenza A/B -     POC COVID-19 BinaxNow  Migraine without aura and without status migrainosus, not intractable Assessment & Plan: Refilled Bernita Raisin  Orders: Bernita Raisin; Take 1 tablet by mouth at onset of headache. May repeat  dose x 1 after 2 hours if needed. Max 200 mg/ 24 hours.  Dispense: 30 tablet; Refill: 1  Other orders -     Doxycycline Hyclate; Take 1 tablet (100 mg total) by mouth 2 (two) times daily for 7 days.  Dispense: 14 tablet; Refill: 0    Follow-up: No follow-ups on file.   Kara Dies, NP

## 2023-05-28 ENCOUNTER — Encounter: Payer: Self-pay | Admitting: Nurse Practitioner

## 2023-05-30 ENCOUNTER — Other Ambulatory Visit (HOSPITAL_COMMUNITY): Payer: Self-pay

## 2023-05-30 ENCOUNTER — Encounter: Payer: Self-pay | Admitting: Nurse Practitioner

## 2023-05-30 ENCOUNTER — Telehealth: Payer: Self-pay

## 2023-05-30 MED ORDER — AZITHROMYCIN 250 MG PO TABS: ORAL_TABLET | ORAL | 0 refills | Status: AC

## 2023-05-30 NOTE — Telephone Encounter (Signed)
PA request has been Submitted. New Encounter created for follow up. For additional info see Pharmacy Prior Auth telephone encounter from 05/30/2023.

## 2023-05-30 NOTE — Telephone Encounter (Signed)
Pharmacy Patient Advocate Encounter   Received notification from Pt Calls Messages that prior authorization for Qulipta 60MG  tablets is required/requested.   Insurance verification completed.   The patient is insured through [employer] .   Per test claim: PA required; PA submitted to above mentioned insurance via CoverMyMeds Key/confirmation #/EOC [id number] Status is pending

## 2023-06-02 ENCOUNTER — Encounter: Payer: Self-pay | Admitting: Neurology

## 2023-06-03 ENCOUNTER — Encounter: Payer: Self-pay | Admitting: Nurse Practitioner

## 2023-06-03 ENCOUNTER — Ambulatory Visit: Payer: BC Managed Care – PPO | Admitting: Nurse Practitioner

## 2023-06-03 VITALS — BP 158/80 | HR 78 | Temp 97.8°F | Ht 67.0 in | Wt 359.6 lb

## 2023-06-03 DIAGNOSIS — R051 Acute cough: Secondary | ICD-10-CM | POA: Diagnosis not present

## 2023-06-03 MED ORDER — DEXAMETHASONE SODIUM PHOSPHATE 10 MG/ML IJ SOLN
8.0000 mg | Freq: Once | INTRAMUSCULAR | Status: AC
  Administered 2023-06-03: 8 mg via INTRAMUSCULAR

## 2023-06-03 MED ORDER — PSEUDOEPH-BROMPHEN-DM 30-2-10 MG/5ML PO SYRP: 5.0000 mL | ORAL_SOLUTION | Freq: Four times a day (QID) | ORAL | 0 refills | Status: DC | PRN

## 2023-06-03 NOTE — Assessment & Plan Note (Addendum)
They have experienced a persistent cough, sinus congestion, and sore throat for approximately 10-12 days. Initially treated with Doxycycline, they switched to Azithromycin due to an allergic reaction, completing the course yesterday. Examination shows no signs of lower respiratory involvement. Do not feel that additional antibiotics are needed at this time. We will administer a steroid injection today and prescribe cough medicine to manage symptoms. They are advised to continue with fluid intake and rest. If there is no improvement, they should return for reassessment.

## 2023-06-03 NOTE — Progress Notes (Signed)
Bethanie Dicker, NP-C Phone: [phone]  Danielle Irwin is a 52 y.o. female who presents today for cough and congestion.  Discussed the use of AI scribe software for clinical note transcription with the patient, who gave verbal consent to proceed.  History of Present Illness   The patient, with a recent diagnosis of sinus infection, presents with persistent coughing, producing thick yellow sputum. They describe an intermittent sensation of a 'kazoo stuck in their throat' and a 'rattly' sound. The coughing has been worsening and is associated with chest discomfort.  The initial symptoms started approximately ten days prior to the current visit, with the patient feeling unwell and experiencing a headache. By the third day, the headache had intensified, and the patient developed a migraine. The patient also reported pain in the frontal and maxillary sinuses, which has since improved.  The patient was initially prescribed doxycycline, which was discontinued due to an allergic reaction manifesting as itching. The treatment was switched to azithromycin (Z-Pak), which the patient completed the day before the current visit.  Despite the antibiotic treatment, the patient reports ongoing symptoms including congestion, sore throat, shortness of breath, and low-grade fever. They also report feeling fatigued and sleeping excessively. The patient has experienced some nausea but no vomiting. Recently, they noticed a change in their sense of taste, with food tasting 'weird.'  The patient denies any known exposure to infectious agents. Previous testing for COVID-19 and influenza was negative. The patient has been managing the cough with over-the-counter Delsym. They also report antibiotic-associated diarrhea.  The patient has a history of receiving steroid treatment and is familiar with its side effects. They report some ear discomfort and recall having fluid in the right ear during the previous week.      Social  History   Tobacco Use  Smoking Status Former   Current packs/day: 0.00   Types: Cigarettes   Quit date: 05/26/1990   Years since quitting: 33.0  Smokeless Tobacco Never    Current Outpatient Medications on File Prior to Visit  Medication Sig Dispense Refill   acetaminophen (TYLENOL) 650 MG CR tablet Take 650 mg by mouth every 8 (eight) hours as needed for pain.     albuterol (VENTOLIN HFA) 108 (90 Base) MCG/ACT inhaler Inhale 2 puffs into the lungs every 6 (six) hours as needed for wheezing or shortness of breath. 8 g 2   Alum & Mag Hydroxide-Simeth (ANTACID ANTI-GAS PO) Take 1 tablet by mouth daily as needed (for gas).     Ascorbic Acid (VITAMIN C) 1000 MG tablet Take 1,000 mg by mouth 2 (two) times daily.     aspirin EC 81 MG tablet Take 81 mg by mouth daily. Swallow whole.     aspirin-acetaminophen-caffeine (EXCEDRIN MIGRAINE) 250-250-65 MG tablet Take 2 tablets by mouth 2 (two) times daily as needed for headache or migraine.     Atogepant (QULIPTA) 60 MG TABS Take 1 tablet (60 mg total) by mouth daily. 30 tablet 11   azithromycin (ZITHROMAX) 250 MG tablet Take 2 tablets on day 1, then 1 tablet daily on days 2 through 5 6 tablet 0   b complex vitamins tablet Take 1 tablet by mouth daily.     bismuth subsalicylate (PEPTO-BISMOL) 262 MG/15ML suspension Take 30 mLs by mouth 4 (four) times daily -  before meals and at bedtime. 360 mL 0   budesonide-formoterol (SYMBICORT) 80-4.5 MCG/ACT inhaler Inhale 2 puffs into the lungs 2 (two) times daily. 1 each 3   Calcium Carbonate-Vitamin D (  CALCIUM-D PO) Take 1,200 mg by mouth 2 (two) times daily.     cyclobenzaprine (FLEXERIL) 10 MG tablet Take 1 tablet by mouth three times daily as needed for muscle spasm 30 tablet 0   dicyclomine (BENTYL) 20 MG tablet Take 1 tablet (20 mg total) by mouth 3 (three) times daily before meals. 90 tablet 2   doxycycline (VIBRA-TABS) 100 MG tablet Take 1 tablet (100 mg total) by mouth 2 (two) times daily for 7 days.  14 tablet 0   ECHINACEA-GOLDEN SEAL PO Take 1 tablet by mouth 2 (two) times daily as needed (immune system boost).     EPINEPHrine 0.3 mg/0.3 mL IJ SOAJ injection INJECT 0.3ML INTO THE MUSCLE ONCE AS DIRECTED 2 each 0   furosemide (LASIX) 20 MG tablet Take 1 tablet (20 mg total) by mouth daily. 30 tablet 3   hydrochlorothiazide (HYDRODIURIL) 25 MG tablet Take 25 mg by mouth daily.     hydrOXYzine (VISTARIL) 25 MG capsule TAKE 1 CAPSULE BY MOUTH EVERY 8 HOURS AS NEEDED 90 capsule 0   lisinopril (ZESTRIL) 10 MG tablet Take 1 tablet (10 mg total) by mouth daily. 90 tablet 0   Magnesium 250 MG TABS Take 500 mg by mouth at bedtime. Dosage varies     Melatonin 10 MG TABS Take 1 tablet by mouth at bedtime.     meloxicam (MOBIC) 7.5 MG tablet Take 1 tablet (7.5 mg total) by mouth daily. 30 tablet 0   Multiple Vitamin (MULTIVITAMIN WITH MINERALS) TABS tablet Take 1 tablet by mouth daily. With Elderberry     Multiple Vitamins-Minerals (ZINC PO) Take 500 mg by mouth daily as needed (immune system boost).     nystatin (MYCOSTATIN/NYSTOP) powder Apply 1 Application topically 3 (three) times daily. 60 g 1   ondansetron (ZOFRAN) 4 MG tablet TAKE 1 TABLET BY MOUTH EVERY 8 HOURS AS NEEDED FOR NAUSEA FOR VOMITING 30 tablet 0   pantoprazole (PROTONIX) 40 MG tablet Take 1 tablet (40 mg total) by mouth daily. 30 tablet 2   propranolol ER (INDERAL LA) 80 MG 24 hr capsule Take 1 capsule (80 mg total) by mouth daily. 90 capsule 1   rosuvastatin (CRESTOR) 5 MG tablet Take 1 tablet (5 mg total) by mouth at bedtime. 90 tablet 0   sertraline (ZOLOFT) 100 MG tablet Take 1 tablet (100 mg total) by mouth daily. 90 tablet 2   traZODone (DESYREL) 100 MG tablet Take 1 tablet (100 mg total) by mouth at bedtime. 90 tablet 1   Ubrogepant (UBRELVY) 100 MG TABS Take 1 tablet by mouth at onset of headache. May repeat dose x 1 after 2 hours if needed. Max 200 mg/ 24 hours. 30 tablet 1   Vitamin D, Ergocalciferol, (DRISDOL) 1.25 MG  (50000 UNIT) CAPS capsule 1 po q Wed and 1 po q Sun 8 capsule 0   vitamin k 100 MCG tablet Take 100 mcg by mouth daily.     No current facility-administered medications on file prior to visit.    ROS see history of present illness  Objective  Physical Exam Vitals:   06/03/23 0806  BP: (!) 158/80  Pulse: 78  Temp: 97.8 F (36.6 C)  SpO2: 96%    BP Readings from Last 3 Encounters:  06/03/23 (!) 158/80  05/27/23 132/84  05/20/23 118/70   Wt Readings from Last 3 Encounters:  06/03/23 (!) 359 lb 9.6 oz (163.1 kg)  05/27/23 (!) 355 lb 3.2 oz (161.1 kg)  05/20/23 (!) 366 lb  2 oz (166.1 kg)    Physical Exam Constitutional:      General: She is not in acute distress.    Appearance: Normal appearance.  HENT:     Head: Normocephalic.     Right Ear: A middle ear effusion is present.     Left Ear: Tympanic membrane normal.     Nose: Congestion present.     Mouth/Throat:     Mouth: Mucous membranes are moist.     Pharynx: Oropharynx is clear.  Eyes:     Conjunctiva/sclera: Conjunctivae normal.     Pupils: Pupils are equal, round, and reactive to light.  Cardiovascular:     Rate and Rhythm: Normal rate and regular rhythm.     Heart sounds: Normal heart sounds.  Pulmonary:     Effort: Pulmonary effort is normal.     Breath sounds: Normal breath sounds. No wheezing, rhonchi or rales.  Lymphadenopathy:     Cervical: No cervical adenopathy.  Skin:    General: Skin is warm and dry.  Neurological:     General: No focal deficit present.     Mental Status: She is alert.  Psychiatric:        Mood and Affect: Mood normal.        Behavior: Behavior normal.     Assessment/Plan: Please see individual problem list.  Acute cough Assessment & Plan: They have experienced a persistent cough, sinus congestion, and sore throat for approximately 10-12 days. Initially treated with Doxycycline, they switched to Azithromycin due to an allergic reaction, completing the course yesterday.  Examination shows no signs of lower respiratory involvement. Do not feel that additional antibiotics are needed at this time. We will administer a steroid injection today and prescribe cough medicine to manage symptoms. They are advised to continue with fluid intake and rest. If there is no improvement, they should return for reassessment.   Orders: -     dexAMETHasone Sodium Phosphate -     Pseudoeph-Bromphen-DM; Take 5-10 mLs by mouth every 6 (six) hours as needed.  Dispense: 120 mL; Refill: 0   Return if symptoms worsen or fail to improve.   Bethanie Dicker, NP-C [facility]

## 2023-06-03 NOTE — Progress Notes (Signed)
Provider gave verbal orders to administer 8mg  of dexamethasone. 0.8 mL was drawn from a 10 mg/mL vial leaving 0.2 mL for waste. Pt tolerated injection well in the left anterior thigh.

## 2023-06-04 NOTE — Assessment & Plan Note (Addendum)
Servings of CardioMax COVID and flu negative. Will treat with doxycycline she has just completed prednisone tapering for back pain. Increase fluid intake, rest. Use steam/humidifier. Will let us know if symptoms not improving. Work note provided.

## 2023-06-04 NOTE — Assessment & Plan Note (Signed)
 Refilled Bernita Raisin

## 2023-06-08 ENCOUNTER — Other Ambulatory Visit: Payer: Self-pay | Admitting: Nurse Practitioner

## 2023-06-09 ENCOUNTER — Ambulatory Visit
Admission: RE | Admit: 2023-06-09 | Discharge: 2023-06-09 | Disposition: A | Discharge status: Home / Self Care | Payer: BC Managed Care – PPO | Source: Ambulatory Visit | Attending: Neurology | Admitting: Neurology

## 2023-06-09 DIAGNOSIS — R42 Dizziness and giddiness: Secondary | ICD-10-CM | POA: Diagnosis not present

## 2023-06-09 DIAGNOSIS — G43E11 Chronic migraine with aura, intractable, with status migrainosus: Secondary | ICD-10-CM

## 2023-06-09 DIAGNOSIS — G43111 Migraine with aura, intractable, with status migrainosus: Secondary | ICD-10-CM | POA: Diagnosis not present

## 2023-06-09 MED ORDER — GADOPICLENOL 0.5 MMOL/ML IV SOLN
10.0000 mL | Freq: Once | INTRAVENOUS | Status: AC | PRN
Start: 2023-06-09 — End: 2023-06-09
  Administered 2023-06-09: 10 mL via INTRAVENOUS

## 2023-06-10 ENCOUNTER — Ambulatory Visit (INDEPENDENT_AMBULATORY_CARE_PROVIDER_SITE_OTHER): Payer: BC Managed Care – PPO | Admitting: Family

## 2023-06-10 ENCOUNTER — Ambulatory Visit
Admission: RE | Admit: 2023-06-10 | Discharge: 2023-06-10 | Disposition: A | Discharge status: Home / Self Care | Payer: BC Managed Care – PPO | Source: Ambulatory Visit | Attending: Family

## 2023-06-10 ENCOUNTER — Ambulatory Visit
Admission: RE | Admit: 2023-06-10 | Discharge: 2023-06-10 | Disposition: A | Discharge status: Home / Self Care | Payer: BC Managed Care – PPO | Source: Ambulatory Visit | Attending: Family | Admitting: Family

## 2023-06-10 ENCOUNTER — Encounter: Payer: Self-pay | Admitting: Family

## 2023-06-10 VITALS — BP 138/76 | HR 99 | Temp 97.6°F | Ht 67.0 in | Wt 365.6 lb

## 2023-06-10 DIAGNOSIS — J40 Bronchitis, not specified as acute or chronic: Secondary | ICD-10-CM

## 2023-06-10 DIAGNOSIS — R059 Cough, unspecified: Secondary | ICD-10-CM | POA: Diagnosis not present

## 2023-06-10 MED ORDER — PREDNISONE 10 MG PO TABS: ORAL_TABLET | ORAL | 0 refills | Status: DC

## 2023-06-10 MED ORDER — GUAIFENESIN-CODEINE 100-10 MG/5ML PO SOLN: 5.0000 mL | Freq: Every evening | ORAL | 0 refills | Status: AC | PRN

## 2023-06-10 MED ORDER — ALBUTEROL SULFATE (2.5 MG/3ML) 0.083% IN NEBU: 2.5000 mg | INHALATION_SOLUTION | Freq: Four times a day (QID) | RESPIRATORY_TRACT | 1 refills | Status: AC | PRN

## 2023-06-10 NOTE — Assessment & Plan Note (Addendum)
 No acute respiratory distress.  Afebrile.  Pending chest x-ray to evaluate for pneumonia.  She has completed azithromycin .  H/o asthma.  Provided refill of albuterol  to be used in nebulizer for more effectiveness particularly when she is having a coughing episode. Start prednisone  taper.  Advised the Symbicort  2 puffs twice daily (currently using once daily).  Provided codeine -based cough syrup to be used at bedtime prn.  Consider allergy  consult, trial of PPI for possible GERD aggravating

## 2023-06-10 NOTE — Patient Instructions (Addendum)
 Increase symbiort to 2 puff TWICE daily.   [facility] to [facility] chest x-ray performed today.  Please start prednisone  ensure all doses are in before noon.  Prednisone  can interfere with sleep.  I provided you with a codeine -based cough syrup  Please take cough medication at night only as needed. As we discussed, I do not recommend dosing throughout the day as coughing is a protective mechanism . It also helps to break up thick mucous.  Do not take cough suppressants with alcohol as can lead to trouble breathing. Advise caution if taking cough suppressant and operating machinery ( i.e driving a car) as you may feel very tired.  It is imperative that you store this medication away from children, pets.  Please store this in a safe place so that no one else is able to use   Please let us  know how you are doing

## 2023-06-10 NOTE — Progress Notes (Signed)
 Assessment & Plan:  Bronchitis Assessment & Plan: No acute respiratory distress.  Afebrile.  Pending chest x-ray to evaluate for pneumonia.  She has completed azithromycin .  H/o asthma.  Provided refill of albuterol  to be used in nebulizer for more effectiveness particularly when she is having a coughing episode. Start prednisone  taper.  Advised the Symbicort  2 puffs twice daily (currently using once daily).  Provided codeine -based cough syrup to be used at bedtime prn.  Consider allergy  consult, trial of PPI for possible GERD aggravating  Orders: -     DG Chest 2 View; Future -     Albuterol  Sulfate; Take 3 mLs (2.5 mg total) by nebulization every 6 (six) hours as needed for wheezing or shortness of breath.  Dispense: 150 mL; Refill: 1 -     predniSONE ; Take 40 mg by mouth on day 1, then taper 10 mg daily until gone  Dispense: 10 tablet; Refill: 0 -     guaiFENesin -Codeine ; Take 5 mLs by mouth at bedtime as needed for cough.  Dispense: 60 mL; Refill: 0     Return precautions given.   Risks, benefits, and alternatives of the medications and treatment plan prescribed today were discussed, and patient expressed understanding.   Education regarding symptom management and diagnosis given to patient on AVS either electronically or printed.  Return in about 1 week (around 06/17/2023).  Danielle Northern, FNP  Subjective:    Patient ID: Danielle Irwin, female    DOB: [birth year], 52 y.o.   MRN: [id number]  CC: Danielle Irwin is a 52 y.o. female who presents today for an acute visit.    HPI: Complains of cough x 3 weeks, unchanged  Cough waxes between productive to dry.  She describes coughing fits.  Cough is worse when moving around or talking. Cough is worse after eating.   Sinus pain has improved on zpak however symptoms have recurred.   Intermittent ear pain.   Denies fever, CP, leg swelling, SOB  She is taking dayquil, nyquil , mucinex  with temporarily relief.    History of DVT,  hypertension, asthma, OSA   Seen for acute cough 06/03/2023 and started on brompheniramine-pseudoephedrine -DM 30-2-10 MG/5ML syrup  She has been using albuterol  once daily and symbicort  once daily.   Symptoms initially started 05/24/2023.  Initially started on doxycycline  discontinued due to allergic reaction is itching.  She was changed to a Z-Pak which was completed 06/02/2023.  Provided dexamethasone  IM 06/03/23 and unsure if helped   DG chest 03/12/23 without active cardiopulmonary disease  Remote history of smoking  Denies vaping .   No h/o opioid abuse.   She has been on prednisone  before in the past and tolerated well Allergies: Benzalkonium chloride, Neosporin [neomycin-bacitracin  zn-polymyx], Polysporin [bacitracin -polymyxin b ], Bee venom, Bupropion hcl, Cephalexin, Doxycycline , and Latex Current Outpatient Medications on File Prior to Visit  Medication Sig Dispense Refill   acetaminophen  (TYLENOL ) 650 MG CR tablet Take 650 mg by mouth every 8 (eight) hours as needed for pain.     albuterol  (VENTOLIN  HFA) 108 (90 Base) MCG/ACT inhaler Inhale 2 puffs into the lungs every 6 (six) hours as needed for wheezing or shortness of breath. 8 g 2   Alum & Mag Hydroxide-Simeth (ANTACID ANTI-GAS PO) Take 1 tablet by mouth daily as needed (for gas).     Ascorbic Acid  (VITAMIN C ) 1000 MG tablet Take 1,000 mg by mouth 2 (two) times daily.     aspirin  EC 81 MG tablet Take 81 mg by  mouth daily. Swallow whole.     aspirin -acetaminophen -caffeine (EXCEDRIN MIGRAINE) 250-250-65 MG tablet Take 2 tablets by mouth 2 (two) times daily as needed for headache or migraine.     Atogepant  (QULIPTA ) 60 MG TABS Take 1 tablet (60 mg total) by mouth daily. 30 tablet 11   b complex vitamins tablet Take 1 tablet by mouth daily.     bismuth  subsalicylate (PEPTO-BISMOL) 262 MG/15ML suspension Take 30 mLs by mouth 4 (four) times daily -  before meals and at bedtime. 360 mL 0   budesonide -formoterol  (SYMBICORT ) 80-4.5  MCG/ACT inhaler Inhale 2 puffs into the lungs 2 (two) times daily. 1 each 3   Calcium  Carbonate-Vitamin D  (CALCIUM -D PO) Take 1,200 mg by mouth 2 (two) times daily.     cyclobenzaprine  (FLEXERIL ) 10 MG tablet Take 1 tablet by mouth three times daily as needed for muscle spasm 30 tablet 0   dicyclomine  (BENTYL ) 20 MG tablet Take 1 tablet (20 mg total) by mouth 3 (three) times daily before meals. 90 tablet 2   ECHINACEA-GOLDEN SEAL PO Take 1 tablet by mouth 2 (two) times daily as needed (immune system boost).     EPINEPHrine  0.3 mg/0.3 mL IJ SOAJ injection INJECT 0.3ML INTO THE MUSCLE ONCE AS DIRECTED 2 each 0   furosemide  (LASIX ) 20 MG tablet Take 1 tablet (20 mg total) by mouth daily. 30 tablet 3   hydrochlorothiazide  (HYDRODIURIL ) 25 MG tablet Take 25 mg by mouth daily.     hydrOXYzine  (VISTARIL ) 25 MG capsule TAKE 1 CAPSULE BY MOUTH EVERY 8 HOURS AS NEEDED 90 capsule 0   lisinopril  (ZESTRIL ) 10 MG tablet Take 1 tablet (10 mg total) by mouth daily. 90 tablet 0   Magnesium  250 MG TABS Take 500 mg by mouth at bedtime. Dosage varies     Melatonin 10 MG TABS Take 1 tablet by mouth at bedtime.     meloxicam  (MOBIC ) 7.5 MG tablet Take 1 tablet (7.5 mg total) by mouth daily. 30 tablet 0   Multiple Vitamin (MULTIVITAMIN WITH MINERALS) TABS tablet Take 1 tablet by mouth daily. With Elderberry     Multiple Vitamins-Minerals (ZINC  PO) Take 500 mg by mouth daily as needed (immune system boost).     nystatin  (MYCOSTATIN /NYSTOP ) powder Apply 1 Application topically 3 (three) times daily. 60 g 1   ondansetron  (ZOFRAN ) 4 MG tablet TAKE 1 TABLET BY MOUTH EVERY 8 HOURS AS NEEDED FOR NAUSEA FOR VOMITING 30 tablet 0   pantoprazole  (PROTONIX ) 40 MG tablet Take 1 tablet (40 mg total) by mouth daily. 30 tablet 2   propranolol  ER (INDERAL  LA) 80 MG 24 hr capsule Take 1 capsule (80 mg total) by mouth daily. 90 capsule 1   rosuvastatin  (CRESTOR ) 5 MG tablet Take 1 tablet (5 mg total) by mouth at bedtime. 90 tablet 0    sertraline  (ZOLOFT ) 100 MG tablet Take 1 tablet (100 mg total) by mouth daily. 90 tablet 2   traZODone  (DESYREL ) 100 MG tablet Take 1 tablet (100 mg total) by mouth at bedtime. 90 tablet 1   Ubrogepant  (UBRELVY ) 100 MG TABS Take 1 tablet by mouth at onset of headache. May repeat dose x 1 after 2 hours if needed. Max 200 mg/ 24 hours. 30 tablet 1   Vitamin D , Ergocalciferol , (DRISDOL ) 1.25 MG (50000 UNIT) CAPS capsule 1 po q Wed and 1 po q Sun 8 capsule 0   vitamin k 100 MCG tablet Take 100 mcg by mouth daily.     No current facility-administered  medications on file prior to visit.    Review of Systems  Constitutional:  Negative for chills and fever.  HENT:  Positive for congestion and ear pain.   Respiratory:  Positive for cough. Negative for shortness of breath and wheezing.   Cardiovascular:  Negative for chest pain, palpitations and leg swelling.  Gastrointestinal:  Negative for nausea and vomiting.      Objective:    BP 138/76   Pulse 99   Temp 97.6 F (36.4 C) (Oral)   Ht 5' 7 (1.702 m)   Wt (!) 365 lb 9.6 oz (165.8 kg)   LMP 07/11/2016   SpO2 98%   BMI 57.26 kg/m   BP Readings from Last 3 Encounters:  06/10/23 138/76  06/03/23 (!) 158/80  05/27/23 132/84   Wt Readings from Last 3 Encounters:  06/10/23 (!) 365 lb 9.6 oz (165.8 kg)  06/03/23 (!) 359 lb 9.6 oz (163.1 kg)  05/27/23 (!) 355 lb 3.2 oz (161.1 kg)    Physical Exam Vitals reviewed.  Constitutional:      Appearance: She is well-developed.  HENT:     Head: Normocephalic and atraumatic.     Right Ear: Hearing, tympanic membrane, ear canal and external ear normal. No decreased hearing noted. No drainage, swelling or tenderness. No middle ear effusion. No foreign body. Tympanic membrane is not erythematous or bulging.     Left Ear: Hearing, tympanic membrane, ear canal and external ear normal. No decreased hearing noted. No drainage, swelling or tenderness.  No middle ear effusion. No foreign body. Tympanic  membrane is not erythematous or bulging.     Nose: Nose normal. No rhinorrhea.     Right Sinus: No maxillary sinus tenderness or frontal sinus tenderness.     Left Sinus: No maxillary sinus tenderness or frontal sinus tenderness.     Mouth/Throat:     Pharynx: Uvula midline. No oropharyngeal exudate or posterior oropharyngeal erythema.     Tonsils: No tonsillar abscesses.  Eyes:     Conjunctiva/sclera: Conjunctivae normal.  Cardiovascular:     Rate and Rhythm: Regular rhythm.     Pulses: Normal pulses.     Heart sounds: Normal heart sounds.  Pulmonary:     Effort: Pulmonary effort is normal.     Breath sounds: Normal breath sounds. No wheezing, rhonchi or rales.  Lymphadenopathy:     Head:     Right side of head: No submental, submandibular, tonsillar, preauricular, posterior auricular or occipital adenopathy.     Left side of head: No submental, submandibular, tonsillar, preauricular, posterior auricular or occipital adenopathy.     Cervical: No cervical adenopathy.  Skin:    General: Skin is warm and dry.  Neurological:     Mental Status: She is alert.  Psychiatric:        Speech: Speech normal.        Behavior: Behavior normal.        Thought Content: Thought content normal.

## 2023-06-11 NOTE — Progress Notes (Signed)
 Ellouise Console, PA-C [address]  [address]  [place], [place] [address]  Main: [phone]  Fax: [phone]   Primary Care Physician: Vincente Saber, NP  Primary Gastroenterologist:  Ellouise Console, PA-C / Dr. Ruel Kung    CC: F/U nausea, vomiting, abdominal pain, GERD, diarrhea, and gas  HPI: Danielle Irwin is a 53 y.o. female returns for f/u of nausea, vomiting, abdominal pain, GERD, diarrhea, and gas.  5 weeks ago she was started on dicyclomine  20 Mg 2 times daily and Protonix  40 Mg daily.  Advised to avoid milk, dairy, and sugar alcohols.  On this treatment she is feeling better.  Acid reflux is controlled on PPI.  Her bowel movements improved until she was recently started on antibiotic for bronchitis.  She is currently having 2 or 3 loose stools per day.  She started taking probiotic.  She has tried Imodium in the past which caused constipation.  She has to have frequent bathroom breaks at her job due to episodes of diarrhea.  04/2023: Celiac labs negative.  H. pylori breath test negative. Stool studies: Negative C. difficile, negative Giardia, negative GI pathogen panel.  Negative food allergy  panel and negative alpha gal.  Normal CBC.  03/2023: Normal CBC, CMP, TSH   09/2022 Colonoscopy (Screening) by Dr. Kung: Normal.  10-year repeat.  Medical history significant for migraines, asthma, OSA, morbid obesity, fibromyalgia, depression, anxiety, and stress.   Current Outpatient Medications  Medication Sig Dispense Refill   acetaminophen  (TYLENOL ) 650 MG CR tablet Take 650 mg by mouth every 8 (eight) hours as needed for pain.     albuterol  (PROVENTIL ) (2.5 MG/3ML) 0.083% nebulizer solution Take 3 mLs (2.5 mg total) by nebulization every 6 (six) hours as needed for wheezing or shortness of breath. 150 mL 1   albuterol  (VENTOLIN  HFA) 108 (90 Base) MCG/ACT inhaler Inhale 2 puffs into the lungs every 6 (six) hours as needed for wheezing or shortness of breath. 8 g 2   Alum & Mag  Hydroxide-Simeth (ANTACID ANTI-GAS PO) Take 1 tablet by mouth daily as needed (for gas).     Ascorbic Acid  (VITAMIN C ) 1000 MG tablet Take 1,000 mg by mouth 2 (two) times daily.     aspirin  EC 81 MG tablet Take 81 mg by mouth daily. Swallow whole.     aspirin -acetaminophen -caffeine (EXCEDRIN MIGRAINE) 250-250-65 MG tablet Take 2 tablets by mouth 2 (two) times daily as needed for headache or migraine.     Atogepant  (QULIPTA ) 60 MG TABS Take 1 tablet (60 mg total) by mouth daily. 30 tablet 11   b complex vitamins tablet Take 1 tablet by mouth daily.     bismuth  subsalicylate (PEPTO-BISMOL) 262 MG/15ML suspension Take 30 mLs by mouth 4 (four) times daily -  before meals and at bedtime. 360 mL 0   budesonide -formoterol  (SYMBICORT ) 80-4.5 MCG/ACT inhaler Inhale 2 puffs into the lungs 2 (two) times daily. 1 each 3   Calcium  Carbonate-Vitamin D  (CALCIUM -D PO) Take 1,200 mg by mouth 2 (two) times daily.     cyclobenzaprine  (FLEXERIL ) 10 MG tablet Take 1 tablet by mouth three times daily as needed for muscle spasm 30 tablet 0   dicyclomine  (BENTYL ) 20 MG tablet Take 1 tablet (20 mg total) by mouth 3 (three) times daily before meals. 90 tablet 2   ECHINACEA-GOLDEN SEAL PO Take 1 tablet by mouth 2 (two) times daily as needed (immune system boost).     EPINEPHrine  0.3 mg/0.3 mL IJ SOAJ injection INJECT  0.3ML INTO THE MUSCLE ONCE AS DIRECTED 2 each 0   furosemide  (LASIX ) 20 MG tablet Take 1 tablet (20 mg total) by mouth daily. 30 tablet 3   guaiFENesin -codeine  100-10 MG/5ML syrup Take 5 mLs by mouth at bedtime as needed for cough. 60 mL 0   hydrochlorothiazide  (HYDRODIURIL ) 25 MG tablet Take 25 mg by mouth daily.     hydrOXYzine  (VISTARIL ) 25 MG capsule TAKE 1 CAPSULE BY MOUTH EVERY 8 HOURS AS NEEDED 90 capsule 0   lisinopril  (ZESTRIL ) 10 MG tablet Take 1 tablet (10 mg total) by mouth daily. 90 tablet 0   Magnesium  250 MG TABS Take 500 mg by mouth at bedtime. Dosage varies     Melatonin 10 MG TABS Take 1  tablet by mouth at bedtime.     meloxicam  (MOBIC ) 7.5 MG tablet Take 1 tablet (7.5 mg total) by mouth daily. 30 tablet 0   Multiple Vitamin (MULTIVITAMIN WITH MINERALS) TABS tablet Take 1 tablet by mouth daily. With Elderberry     Multiple Vitamins-Minerals (ZINC  PO) Take 500 mg by mouth daily as needed (immune system boost).     nystatin  (MYCOSTATIN /NYSTOP ) powder Apply 1 Application topically 3 (three) times daily. 60 g 1   ondansetron  (ZOFRAN ) 4 MG tablet TAKE 1 TABLET BY MOUTH EVERY 8 HOURS AS NEEDED FOR NAUSEA FOR VOMITING 30 tablet 0   pantoprazole  (PROTONIX ) 40 MG tablet Take 1 tablet (40 mg total) by mouth daily. 30 tablet 2   predniSONE  (DELTASONE ) 10 MG tablet Take 40 mg by mouth on day 1, then taper 10 mg daily until gone 10 tablet 0   propranolol  ER (INDERAL  LA) 80 MG 24 hr capsule Take 1 capsule (80 mg total) by mouth daily. 90 capsule 1   rosuvastatin  (CRESTOR ) 5 MG tablet Take 1 tablet (5 mg total) by mouth at bedtime. 90 tablet 0   sertraline  (ZOLOFT ) 100 MG tablet Take 1 tablet (100 mg total) by mouth daily. 90 tablet 2   traZODone  (DESYREL ) 100 MG tablet Take 1 tablet (100 mg total) by mouth at bedtime. 90 tablet 1   Ubrogepant  (UBRELVY ) 100 MG TABS Take 1 tablet by mouth at onset of headache. May repeat dose x 1 after 2 hours if needed. Max 200 mg/ 24 hours. 30 tablet 1   Vitamin D , Ergocalciferol , (DRISDOL ) 1.25 MG (50000 UNIT) CAPS capsule 1 po q Wed and 1 po q Sun 8 capsule 0   vitamin k 100 MCG tablet Take 100 mcg by mouth daily.     No current facility-administered medications for this visit.    Allergies as of 06/12/2023 - Review Complete 06/12/2023  Allergen Reaction Noted   Benzalkonium chloride Anaphylaxis and Hives 12/28/2014   Neosporin [neomycin-bacitracin  zn-polymyx] Anaphylaxis and Hives 12/28/2014   Polysporin [bacitracin -polymyxin b ] Hives 01/06/2018   Bee venom Hives and Swelling Oct 17, 1970   Bupropion hcl Other (See Comments) 06/28/1998   Cephalexin  Hives and Swelling 06/21/1998   Doxycycline  Itching 06/10/2023   Latex  06/02/2014    Past Medical History:  Diagnosis Date   Anemia    Anxiety    Asthma    Back pain    Depression    Edema of both lower extremities    Epstein Barr infection    Family history of adverse reaction to anesthesia    FATHER WAS SLOW TO WAKE UP   Fibromyalgia    GERD (gastroesophageal reflux disease)    H/O blood clots    Headache    OCCASIONAL  MIGRAINES   History of fibromyalgia    HNP (herniated nucleus pulposus), lumbar    Hx of epistaxis    Hypertension    Insomnia    Joint pain    OA (osteoarthritis)    Obesity    PCOS (polycystic ovarian syndrome)    Sensitive skin    Sleep apnea    Vitamin D  deficiency     Past Surgical History:  Procedure Laterality Date   BREAST BIOPSY Left 09/17/2022   Stereo bx, Ribbon Clip, path pending   BREAST BIOPSY Left 09/17/2022   MM LT BREAST BX W LOC DEV 1ST LESION IMAGE BX SPEC STEREO GUIDE 09/17/2022 [facility]   COLONOSCOPY WITH PROPOFOL  N/A 09/09/2022   Procedure: COLONOSCOPY WITH PROPOFOL ;  Surgeon: Therisa Bi, MD;  Location: [facility] ENDOSCOPY;  Service: Gastroenterology;  Laterality: N/A;   LUMBAR LAMINECTOMY/DECOMPRESSION MICRODISCECTOMY Right 06/02/2014   Procedure: MICRO LUMBAR DECOMPRESSION L4 - L5 ON THE RIGHT 1 LEVEL;  Surgeon: Reyes JAYSON Billing, MD;  Location: [facility] ORS;  Service: Orthopedics;  Laterality: Right;   ORIF HUMERUS FRACTURE Left 08/01/2017   Procedure: OPEN REDUCTION INTERNAL FIXATION (ORIF) PROXIMAL HUMERUS FRACTURE;  Surgeon: Kendal Franky SQUIBB, MD;  Location: [facility] OR;  Service: Orthopedics;  Laterality: Left;   WISDOM TOOTH EXTRACTION      Review of Systems:    All systems reviewed and negative except where noted in HPI.   Physical Examination:   BP 101/61   Pulse 70   Temp 97.8 F (36.6 C)   Ht 5' 7 (1.702 m)   Wt (!) 368 lb 3.2 oz (167 kg)   LMP 07/11/2016   BMI 57.67 kg/m   General: Well-nourished, obese, in no  acute distress.  Neuro: Alert and oriented x 3.  Grossly intact.  Psych: Alert and cooperative, normal mood and affect.   Imaging Studies: DG Chest 2 View Result Date: 06/10/2023 CLINICAL DATA:  Cough EXAM: CHEST - 2 VIEW COMPARISON:  03/12/2023 FINDINGS: The heart size and mediastinal contours are within normal limits. Both lungs are clear. The visualized skeletal structures are unremarkable. IMPRESSION: No active cardiopulmonary disease. Electronically Signed   By: Franky Crease M.D.   On: 06/10/2023 14:22   MR BRAIN W WO CONTRAST Result Date: 06/09/2023 CLINICAL DATA:  Provided history: Intractable chronic migraine with aura with status migrainosus. Headache, new onset. Additional history provided by the scanning technologist: the patient reports associated dizziness and nausea. EXAM: MRI HEAD WITHOUT AND WITH CONTRAST TECHNIQUE: Multiplanar, multiecho pulse sequences of the brain and surrounding structures were obtained without and with intravenous contrast. CONTRAST:  10 mL Vueway  intravenous contrast. COMPARISON:  Brain MRI 05/07/2011. FINDINGS: Brain: No age advanced or lobar predominant parenchymal atrophy. Multifocal T2 FLAIR hyperintense signal abnormality within the cerebral white matter, overall mild but greater than expected for age. 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 No cortical encephalomalacia is identified. There is no acute infarct. No evidence of an intracranial mass. No chronic intracranial blood products. No extra-axial fluid collection. No midline shift. No pathologic intracranial enhancement identified. Vascular: Maintained flow voids within the proximal large arterial vessels. Skull and upper cervical spine: No focal worrisome marrow lesion. Sinuses/Orbits: No mass or acute finding within the imaged orbits. No significant paranasal sinus disease. Other: Trace fluid within the right mastoid air cells. IMPRESSION: 1. No evidence of an acute  intracranial abnormality. 2. Multifocal T2 FLAIR hyperintense signal abnormality within the cerebral white matter, overall mild but greater than expected for  age. Findings have progressed from the prior brain MRI of 05/07/2011. These signal changes are nonspecific and differential considerations include chronic small vessel ischemic disease, sequela of chronic migraine headaches, sequela of a prior infectious/inflammatory process and sequela of demyelinating disease, among others. 3. Unchanged 6 mm pineal cyst. 4. Minimal fluid within the right mastoid air cells. Electronically Signed   By: Rockey Childs D.O.   On: 06/09/2023 19:17   XR Shoulder Left Result Date: 05/21/2023 2 view radiographs of the left shoulder shows stable internal fixation from previous proximal humerus fracture.  There is good glenohumeral joint space.   Assessment and Plan:   Danielle Irwin is a 53 y.o. y/o female Returns for f/u of IBS-D and GERD.  GI symptoms improved on current treatment.  1.  Diarrhea; suspect irritable bowel syndrome with diarrhea.  Recent stool studies negative for infection.  Recent alpha gal and reported allergy  panel negative.  CBC, TSH, CMP normal.  Celiac panel labs were Negative.  Colonoscopy 09/2022 was normal.             -Continue Dicyclomine  20mg  BID prn, #90, 2RF             -Low FODMAP diet handout was given  -Start Align Probiotic 1 capsule once daily  -Start FiberCon tablets, 2 tablets once or twice daily.   2.  Epigastric Pain with Nausea - Resolved on Protonix .             -Recent H. Pylori Breath Test was Negative.  -Continue Protonix  40mg  daily.   3.  GERD             -Continue Protonix  40mg  daily  -Recommend Lifestyle Modifications to prevent Acid Reflux.  Rec. Avoid coffee, sodas, peppermint, garlic, onions, alcohol, citrus fruits, chocolate, tomatoes, fatty and spicey foods.  Avoid eating 2-3 hours before bedtime.    Ellouise Console, PA-C  Follow up as needed if recurrent or  worsening GI symptoms.

## 2023-06-12 ENCOUNTER — Telehealth: Payer: Self-pay

## 2023-06-12 ENCOUNTER — Ambulatory Visit: Payer: BC Managed Care – PPO | Admitting: Physician Assistant

## 2023-06-12 ENCOUNTER — Encounter: Payer: Self-pay | Admitting: Physician Assistant

## 2023-06-12 VITALS — BP 101/61 | HR 70 | Temp 97.8°F | Ht 67.0 in | Wt 368.2 lb

## 2023-06-12 DIAGNOSIS — K219 Gastro-esophageal reflux disease without esophagitis: Secondary | ICD-10-CM

## 2023-06-12 DIAGNOSIS — R197 Diarrhea, unspecified: Secondary | ICD-10-CM

## 2023-06-12 DIAGNOSIS — K580 Irritable bowel syndrome with diarrhea: Secondary | ICD-10-CM

## 2023-06-12 NOTE — Telephone Encounter (Signed)
-----   Message from Juliene Lamar Dunnings sent at 06/10/2023  7:18 AM EST ----- Reviewed MRI of brain.  No concerning findings such as a tumor or other acute etiology.  There are still some very tiny spots on the brain as seen in previous MRI from 12 years ago, just a few more spots but no significant change.  This is nonspecific and likely due to migraine and possibly history of high blood pressure.

## 2023-06-12 NOTE — Patient Instructions (Signed)
 Start FiberCon Tablets, Take 2 tablets once or twice daily to help with diarrhea and irregular bowel movements.

## 2023-06-12 NOTE — Telephone Encounter (Signed)
LMOVM for patient please call the office back.

## 2023-06-16 ENCOUNTER — Ambulatory Visit: Payer: BC Managed Care – PPO | Admitting: Orthopedic Surgery

## 2023-06-18 ENCOUNTER — Ambulatory Visit: Payer: BC Managed Care – PPO | Attending: Cardiovascular Disease

## 2023-06-18 DIAGNOSIS — R011 Cardiac murmur, unspecified: Secondary | ICD-10-CM | POA: Diagnosis not present

## 2023-06-19 LAB — ECHOCARDIOGRAM COMPLETE
AV Mean grad: 12 mm[Hg]
AV Peak grad: 23.2 mm[Hg]
Ao pk vel: 2.41 m/s
Area-P 1/2: 3.31 cm2
S' Lateral: 3.2 cm

## 2023-06-24 ENCOUNTER — Other Ambulatory Visit (HOSPITAL_COMMUNITY): Payer: Self-pay

## 2023-07-01 ENCOUNTER — Other Ambulatory Visit (HOSPITAL_COMMUNITY): Payer: Self-pay

## 2023-07-01 NOTE — Telephone Encounter (Signed)
Pharmacy Patient Advocate Encounter  Received notification from [facility] that Prior Authorization for Qulipta 60MG  tablets has been CANCELLED due to medication is covered through ins. Pharmacy will need to process overrides. (TD, M0, 1G, and SSC 10)  Quantity #30 for 30 is $0.00    PA #/Case ID/Reference #: [id number]

## 2023-07-03 ENCOUNTER — Ambulatory Visit: Payer: BC Managed Care – PPO | Admitting: Nurse Practitioner

## 2023-07-03 ENCOUNTER — Encounter: Payer: Self-pay | Admitting: Nurse Practitioner

## 2023-07-03 VITALS — BP 114/72 | HR 85 | Temp 97.3°F | Ht 67.0 in | Wt 368.2 lb

## 2023-07-03 DIAGNOSIS — F419 Anxiety disorder, unspecified: Secondary | ICD-10-CM

## 2023-07-03 DIAGNOSIS — F32A Depression, unspecified: Secondary | ICD-10-CM

## 2023-07-03 DIAGNOSIS — R197 Diarrhea, unspecified: Secondary | ICD-10-CM | POA: Diagnosis not present

## 2023-07-03 MED ORDER — SERTRALINE HCL 50 MG PO TABS: 50.0000 mg | ORAL_TABLET | Freq: Every day | ORAL | 1 refills | Status: DC

## 2023-07-03 NOTE — Patient Instructions (Signed)
Will increase zoloft from 100 mg to 150 mg daily. Please schedule appointment with psychiatry [facility] [address] [place], [place] [address] Office: [phone] Fax: [phone]

## 2023-07-03 NOTE — Assessment & Plan Note (Signed)
Reports feeling numb, lack of motivation, and desire to sleep. Currently on Sertraline 100mg  and Trazodone for sleep. No suicidal ideation. Has a referral to a psychiatrist but has not scheduled an appointment yet. -Increase Sertraline to 150mg  daily. -Encourage scheduling an appointment with a psychiatrist.

## 2023-07-03 NOTE — Assessment & Plan Note (Signed)
Reports a flare-up with frequent bowel movements and cramping. Symptoms appear to be improving. -Continue current management. -Complete work absence form for missed days due to diarrhea.

## 2023-07-03 NOTE — Progress Notes (Signed)
Established Patient Office Visit  Subjective:  Patient ID: Danielle Irwin, female    DOB: 09-Feb-1971  Age: 53 y.o. MRN: [id number]  CC:  Chief Complaint  Patient presents with   Depression  Discussed the use of a AI scribe software for clinical note transcription with the patient, who gave verbal consent to proceed.   HPI  Otelia M Polzin  with a history of depression, anxiety, and diarrhea, presents with an exacerbation of all conditions. They report missing work due to these issues, which in turn has worsened their depression and anxiety. They believe that the stress from these conditions may have triggered an diarrhea.  The patient describes an diarrhea began on Monday, with severe cramping and frequent bowel movements, peaking at seventeen times on Tuesday. The symptoms have since improved, with eight bowel movements reported on Wednesday. They also report a feeling of numbness and lack of motivation, with a desire to sleep and avoid activities. They express concern about their living conditions, describing a cluttered environment that causes stress but feels overwhelming to address.  The patient has been taking sertraline 100mg  for depression and trazodone for sleep. They feel that the sertraline has helped to some extent, as they are not as depressed as they have been in the past, but they feel they have reached a plateau. They also report a history of taking Wellbutrin and Prozac, but neither was particularly effective.   HPI   Past Medical History:  Diagnosis Date   Anemia    Anxiety    Asthma    Back pain    Depression    Edema of both lower extremities    Epstein Barr infection    Family history of adverse reaction to anesthesia    FATHER WAS SLOW TO WAKE UP   Fibromyalgia    GERD (gastroesophageal reflux disease)    H/O blood clots    Headache    OCCASIONAL MIGRAINES   History of fibromyalgia    HNP (herniated nucleus pulposus), lumbar    Hx of epistaxis     Hypertension    Insomnia    Joint pain    OA (osteoarthritis)    Obesity    PCOS (polycystic ovarian syndrome)    Sensitive skin    Sleep apnea    Vitamin D deficiency     Past Surgical History:  Procedure Laterality Date   BREAST BIOPSY Left 09/17/2022   Stereo bx, Ribbon Clip, path pending   BREAST BIOPSY Left 09/17/2022   MM LT BREAST BX W LOC DEV 1ST LESION IMAGE BX SPEC STEREO GUIDE 09/17/2022 [facility]   COLONOSCOPY WITH PROPOFOL N/A 09/09/2022   Procedure: COLONOSCOPY WITH PROPOFOL;  Surgeon: Wyline Mood, MD;  Location: [facility] ENDOSCOPY;  Service: Gastroenterology;  Laterality: N/A;   LUMBAR LAMINECTOMY/DECOMPRESSION MICRODISCECTOMY Right 06/02/2014   Procedure: MICRO LUMBAR DECOMPRESSION L4 - L5 ON THE RIGHT 1 LEVEL;  Surgeon: Javier Docker, MD;  Location: [facility] ORS;  Service: Orthopedics;  Laterality: Right;   ORIF HUMERUS FRACTURE Left 08/01/2017   Procedure: OPEN REDUCTION INTERNAL FIXATION (ORIF) PROXIMAL HUMERUS FRACTURE;  Surgeon: Roby Lofts, MD;  Location: [facility] OR;  Service: Orthopedics;  Laterality: Left;   WISDOM TOOTH EXTRACTION      Family History  Problem Relation Age of Onset   Diabetes Mother    High blood pressure Mother    High Cholesterol Mother    Depression Mother    Anxiety disorder Mother    Sleep apnea Mother  Obesity Mother    Heart attack Father 23   High blood pressure Father    High Cholesterol Father    Heart disease Father    Heart attack Brother    Breast cancer Maternal Aunt 50   Ovarian cancer Neg Hx     Social History   Socioeconomic History   Marital status: Single    Spouse name: Not on file   Number of children: Not on file   Years of education: Not on file   Highest education level: Some college, no degree  Occupational History   Occupation: debt collections  Tobacco Use   Smoking status: Former    Current packs/day: 0.00    Types: Cigarettes    Quit date: 05/26/1990    Years since quitting: 33.1   Smokeless  tobacco: Never  Vaping Use   Vaping status: Never Used  Substance and Sexual Activity   Alcohol use: Yes    Comment: occ   Drug use: No   Sexual activity: Not Currently    Partners: Male    Birth control/protection: Post-menopausal  Other Topics Concern   Not on file  Social History Narrative   Not on file   Social Drivers of Health   Financial Resource Strain: Patient Declined (05/27/2023)   Overall Financial Resource Strain (CARDIA)    Difficulty of Paying Living Expenses: Patient declined  Recent Concern: Financial Resource Strain - Medium Risk (03/26/2023)   Overall Financial Resource Strain (CARDIA)    Difficulty of Paying Living Expenses: Somewhat hard  Food Insecurity: Patient Declined (05/27/2023)   Hunger Vital Sign    Worried About Running Out of Food in the Last Year: Patient declined    Ran Out of Food in the Last Year: Patient declined  Recent Concern: Food Insecurity - Food Insecurity Present (03/26/2023)   Hunger Vital Sign    Worried About Running Out of Food in the Last Year: Sometimes true    Ran Out of Food in the Last Year: Sometimes true  Transportation Needs: No Transportation Needs (05/27/2023)   PRAPARE - Administrator, Civil Service (Medical): No    Lack of Transportation (Non-Medical): No  Physical Activity: Unknown (05/27/2023)   Exercise Vital Sign    Days of Exercise per Week: 0 days    Minutes of Exercise per Session: Not on file  Stress: Stress Concern Present (05/27/2023)   [employer] of Occupational Health - Occupational Stress Questionnaire    Feeling of Stress : Rather much  Social Connections: Unknown (05/27/2023)   Social Connection and Isolation Panel [NHANES]    Frequency of Communication with Friends and Family: Patient declined    Frequency of Social Gatherings with Friends and Family: Patient declined    Attends Religious Services: Patient declined    Database administrator or Organizations: No    Attends  Engineer, structural: Not on file    Marital Status: Never married  Recent Concern: Social Connections - Moderately Isolated (03/26/2023)   Social Connection and Isolation Panel [NHANES]    Frequency of Communication with Friends and Family: Three times a week    Frequency of Social Gatherings with Friends and Family: Never    Attends Religious Services: 1 to 4 times per year    Active Member of [employer] or Organizations: No    Attends Engineer, structural: Not on file    Marital Status: Never married  Intimate Partner Violence: Not on file     Outpatient  Medications Prior to Visit  Medication Sig Dispense Refill   acetaminophen (TYLENOL) 650 MG CR tablet Take 650 mg by mouth every 8 (eight) hours as needed for pain.     albuterol (PROVENTIL) (2.5 MG/3ML) 0.083% nebulizer solution Take 3 mLs (2.5 mg total) by nebulization every 6 (six) hours as needed for wheezing or shortness of breath. 150 mL 1   albuterol (VENTOLIN HFA) 108 (90 Base) MCG/ACT inhaler Inhale 2 puffs into the lungs every 6 (six) hours as needed for wheezing or shortness of breath. 8 g 2   Alum & Mag Hydroxide-Simeth (ANTACID ANTI-GAS PO) Take 1 tablet by mouth daily as needed (for gas).     Ascorbic Acid (VITAMIN C) 1000 MG tablet Take 1,000 mg by mouth 2 (two) times daily.     aspirin EC 81 MG tablet Take 81 mg by mouth daily. Swallow whole.     aspirin-acetaminophen-caffeine (EXCEDRIN MIGRAINE) 250-250-65 MG tablet Take 2 tablets by mouth 2 (two) times daily as needed for headache or migraine.     Atogepant (QULIPTA) 60 MG TABS Take 1 tablet (60 mg total) by mouth daily. 30 tablet 11   b complex vitamins tablet Take 1 tablet by mouth daily.     bismuth subsalicylate (PEPTO-BISMOL) 262 MG/15ML suspension Take 30 mLs by mouth 4 (four) times daily -  before meals and at bedtime. 360 mL 0   budesonide-formoterol (SYMBICORT) 80-4.5 MCG/ACT inhaler Inhale 2 puffs into the lungs 2 (two) times daily. 1 each 3    Calcium Carbonate-Vitamin D (CALCIUM-D PO) Take 1,200 mg by mouth 2 (two) times daily.     cyclobenzaprine (FLEXERIL) 10 MG tablet Take 1 tablet by mouth three times daily as needed for muscle spasm 30 tablet 0   dicyclomine (BENTYL) 20 MG tablet Take 1 tablet (20 mg total) by mouth 3 (three) times daily before meals. 90 tablet 2   ECHINACEA-GOLDEN SEAL PO Take 1 tablet by mouth 2 (two) times daily as needed (immune system boost).     EPINEPHrine 0.3 mg/0.3 mL IJ SOAJ injection INJECT 0.3ML INTO THE MUSCLE ONCE AS DIRECTED 2 each 0   furosemide (LASIX) 20 MG tablet Take 1 tablet (20 mg total) by mouth daily. 30 tablet 3   guaiFENesin-codeine 100-10 MG/5ML syrup Take 5 mLs by mouth at bedtime as needed for cough. 60 mL 0   hydrochlorothiazide (HYDRODIURIL) 25 MG tablet Take 25 mg by mouth daily.     hydrOXYzine (VISTARIL) 25 MG capsule TAKE 1 CAPSULE BY MOUTH EVERY 8 HOURS AS NEEDED 90 capsule 0   lisinopril (ZESTRIL) 10 MG tablet Take 1 tablet (10 mg total) by mouth daily. 90 tablet 0   Magnesium 250 MG TABS Take 500 mg by mouth at bedtime. Dosage varies     Melatonin 10 MG TABS Take 1 tablet by mouth at bedtime.     meloxicam (MOBIC) 7.5 MG tablet Take 1 tablet (7.5 mg total) by mouth daily. 30 tablet 0   Multiple Vitamin (MULTIVITAMIN WITH MINERALS) TABS tablet Take 1 tablet by mouth daily. With Elderberry     Multiple Vitamins-Minerals (ZINC PO) Take 500 mg by mouth daily as needed (immune system boost).     nystatin (MYCOSTATIN/NYSTOP) powder Apply 1 Application topically 3 (three) times daily. 60 g 1   ondansetron (ZOFRAN) 4 MG tablet TAKE 1 TABLET BY MOUTH EVERY 8 HOURS AS NEEDED FOR NAUSEA FOR VOMITING 30 tablet 0   pantoprazole (PROTONIX) 40 MG tablet Take 1 tablet (40 mg total)  by mouth daily. 30 tablet 2   predniSONE (DELTASONE) 10 MG tablet Take 40 mg by mouth on day 1, then taper 10 mg daily until gone 10 tablet 0   propranolol ER (INDERAL LA) 80 MG 24 hr capsule Take 1 capsule (80 mg  total) by mouth daily. 90 capsule 1   rosuvastatin (CRESTOR) 5 MG tablet Take 1 tablet (5 mg total) by mouth at bedtime. 90 tablet 0   sertraline (ZOLOFT) 100 MG tablet Take 1 tablet (100 mg total) by mouth daily. 90 tablet 2   traZODone (DESYREL) 100 MG tablet Take 1 tablet (100 mg total) by mouth at bedtime. 90 tablet 1   Ubrogepant (UBRELVY) 100 MG TABS Take 1 tablet by mouth at onset of headache. May repeat dose x 1 after 2 hours if needed. Max 200 mg/ 24 hours. 30 tablet 1   Vitamin D, Ergocalciferol, (DRISDOL) 1.25 MG (50000 UNIT) CAPS capsule 1 po q Wed and 1 po q Sun 8 capsule 0   vitamin k 100 MCG tablet Take 100 mcg by mouth daily.     No facility-administered medications prior to visit.    Allergies  Allergen Reactions   Benzalkonium Chloride Anaphylaxis and Hives   Neosporin [Neomycin-Bacitracin Zn-Polymyx] Anaphylaxis and Hives   Polysporin [Bacitracin-Polymyxin B] Hives   Bee Venom Hives and Swelling    Throat swelling   Bupropion Hcl Other (See Comments)    angry   Cephalexin Hives and Swelling   Doxycycline Itching    05/2023   Latex     ROS Review of Systems Negative unless indicated in HPI.    Objective:    Physical Exam Constitutional:      Appearance: Normal appearance.  HENT:     Mouth/Throat:     Mouth: Mucous membranes are moist.  Eyes:     Conjunctiva/sclera: Conjunctivae normal.     Pupils: Pupils are equal, round, and reactive to light.  Cardiovascular:     Rate and Rhythm: Normal rate and regular rhythm.     Pulses: Normal pulses.     Heart sounds: Normal heart sounds.  Pulmonary:     Effort: Pulmonary effort is normal.     Breath sounds: Normal breath sounds.  Musculoskeletal:     Cervical back: Normal range of motion. No tenderness.  Skin:    General: Skin is warm.     Findings: No bruising.  Neurological:     General: No focal deficit present.     Mental Status: She is alert and oriented to person, place, and time. Mental status is  at baseline.  Psychiatric:        Mood and Affect: Mood normal.        Behavior: Behavior normal.        Thought Content: Thought content normal.        Judgment: Judgment normal.     BP 114/72   Pulse 85   Temp (!) 97.3 F (36.3 C)   Ht 5\' 7"  (1.702 m)   Wt (!) 368 lb 3.2 oz (167 kg)   LMP 07/11/2016   SpO2 97%   BMI 57.67 kg/m  Wt Readings from Last 3 Encounters:  07/03/23 (!) 368 lb 3.2 oz (167 kg)  06/12/23 (!) 368 lb 3.2 oz (167 kg)  06/10/23 (!) 365 lb 9.6 oz (165.8 kg)     Health Maintenance  Topic Date Due   Pneumococcal Vaccine 73-57 Years old (1 of 2 - PCV) Never done   Hepatitis  C Screening  Never done   Zoster Vaccines- Shingrix (1 of 2) Never done   COVID-19 Vaccine (1 - 2024-25 season) 07/19/2023 (Originally 02/09/2023)   INFLUENZA VACCINE  09/08/2023 (Originally 01/09/2023)   MAMMOGRAM  09/05/2024   DTaP/Tdap/Td (2 - Td or Tdap) 06/25/2027   Cervical Cancer Screening (HPV/Pap Cotest)  09/02/2027   Colonoscopy  09/08/2032   HIV Screening  Completed   HPV VACCINES  Aged Out    There are no preventive care reminders to display for this patient.  Lab Results  Component Value Date   TSH 2.85 03/28/2023   Lab Results  Component Value Date   WBC 7.7 04/23/2023   HGB 14.1 04/23/2023   HCT 42.4 04/23/2023   MCV 94.0 04/23/2023   PLT 259.0 04/23/2023   Lab Results  Component Value Date   NA 139 03/28/2023   K 4.0 03/28/2023   CO2 27 03/28/2023   GLUCOSE 137 (H) 03/28/2023   BUN 14 03/28/2023   CREATININE 0.81 03/28/2023   BILITOT 0.8 03/28/2023   ALKPHOS 40 03/28/2023   AST 27 03/28/2023   ALT 27 03/28/2023   PROT 7.4 03/28/2023   ALBUMIN 4.3 03/28/2023   CALCIUM 9.4 03/28/2023   ANIONGAP 10 08/02/2017   EGFR 86 02/11/2023   GFR 83.33 03/28/2023   Lab Results  Component Value Date   CHOL 245 (H) 02/11/2023   Lab Results  Component Value Date   HDL 50 02/11/2023   Lab Results  Component Value Date   LDLCALC 170 (H) 02/11/2023   Lab  Results  Component Value Date   TRIG 138 02/11/2023   Lab Results  Component Value Date   CHOLHDL 5 07/18/2022   Lab Results  Component Value Date   HGBA1C 5.7 (H) 02/11/2023      Assessment & Plan:  Anxiety and depression Assessment & Plan: Reports feeling numb, lack of motivation, and desire to sleep. Currently on Sertraline 100mg  and Trazodone for sleep. No suicidal ideation. Has a referral to a psychiatrist but has not scheduled an appointment yet. -Increase Sertraline to 150mg  daily. -Encourage scheduling an appointment with a psychiatrist.   Diarrhea, unspecified type Assessment & Plan: Reports a flare-up with frequent bowel movements and cramping. Symptoms appear to be improving. -Continue current management. -Complete work absence form for missed days due to diarrhea.   Other orders -     Sertraline HCl; Take 1 tablet (50 mg total) by mouth daily.  Dispense: 30 tablet; Refill: 1    Follow-up: No follow-ups on file.   Kara Dies, NP

## 2023-07-11 ENCOUNTER — Encounter: Payer: Self-pay | Admitting: Nurse Practitioner

## 2023-07-11 ENCOUNTER — Ambulatory Visit: Payer: BC Managed Care – PPO | Admitting: Nurse Practitioner

## 2023-07-11 VITALS — BP 118/82 | HR 83 | Temp 98.4°F | Ht 67.0 in | Wt 374.2 lb

## 2023-07-11 DIAGNOSIS — G43009 Migraine without aura, not intractable, without status migrainosus: Secondary | ICD-10-CM

## 2023-07-11 NOTE — Progress Notes (Unsigned)
Established Patient Office Visit  Subjective:  Patient ID: Danielle Irwin, female    DOB: 08-Apr-1971  Age: 53 y.o. MRN: [id number]  CC:  Chief Complaint  Patient presents with   Acute Visit    Woke up with a Migraine on 07/10/23 can not work with stabbing pain & blurry vision  Sound/ movement & lights make it worse   Discussed the use of a AI scribe software for clinical note transcription with the patient, who gave verbal consent to proceed.  HPI  Danielle Irwin presents with migraines and missed work due to a severe episode.  They experienced a severe migraine that began as a 'full blown' headache upon waking yesterday morning. The pain was described as an 'ice pick' sensation on the left side of their head, radiating into their neck. Despite taking  Bernita Raisin the migraine persisted, although there is some improvement today.   Associated symptoms with their migraines include nausea, light and sound sensitivity, and blurry vision, which was present yesterday but not today. Stress and changes in barometric pressure can trigger their migraines. They have been under a lot of stress in recent months and have identified fluoride as a trigger in the past, which they have since avoided.  HPI   Past Medical History:  Diagnosis Date   Anemia    Anxiety    Asthma    Back pain    Depression    Edema of both lower extremities    Epstein Barr infection    Family history of adverse reaction to anesthesia    FATHER WAS SLOW TO WAKE UP   Fibromyalgia    GERD (gastroesophageal reflux disease)    H/O blood clots    Headache    OCCASIONAL MIGRAINES   History of fibromyalgia    HNP (herniated nucleus pulposus), lumbar    Hx of epistaxis    Hypertension    Insomnia    Joint pain    OA (osteoarthritis)    Obesity    PCOS (polycystic ovarian syndrome)    Sensitive skin    Sleep apnea    Vitamin D deficiency     Past Surgical History:  Procedure Laterality Date   BREAST BIOPSY Left  09/17/2022   Stereo bx, Ribbon Clip, path pending   BREAST BIOPSY Left 09/17/2022   MM LT BREAST BX W LOC DEV 1ST LESION IMAGE BX SPEC STEREO GUIDE 09/17/2022 [facility]   COLONOSCOPY WITH PROPOFOL N/A 09/09/2022   Procedure: COLONOSCOPY WITH PROPOFOL;  Surgeon: Wyline Mood, MD;  Location: [facility];  Service: Gastroenterology;  Laterality: N/A;   LUMBAR LAMINECTOMY/DECOMPRESSION MICRODISCECTOMY Right 06/02/2014   Procedure: MICRO LUMBAR DECOMPRESSION L4 - L5 ON THE RIGHT 1 LEVEL;  Surgeon: Javier Docker, MD;  Location: [facility];  Service: Orthopedics;  Laterality: Right;   ORIF HUMERUS FRACTURE Left 08/01/2017   Procedure: OPEN REDUCTION INTERNAL FIXATION (ORIF) PROXIMAL HUMERUS FRACTURE;  Surgeon: Roby Lofts, MD;  Location: [facility] OR;  Service: Orthopedics;  Laterality: Left;   WISDOM TOOTH EXTRACTION      Family History  Problem Relation Age of Onset   Diabetes Mother    High blood pressure Mother    High Cholesterol Mother    Depression Mother    Anxiety disorder Mother    Sleep apnea Mother    Obesity Mother    Heart attack Father 17   High blood pressure Father    High Cholesterol Father    Heart disease Father  Heart attack Brother    Breast cancer Maternal Aunt 50   Ovarian cancer Neg Hx     Social History   Socioeconomic History   Marital status: Single    Spouse name: Not on file   Number of children: Not on file   Years of education: Not on file   Highest education level: Some college, no degree  Occupational History   Occupation: debt collections  Tobacco Use   Smoking status: Former    Current packs/day: 0.00    Types: Cigarettes    Quit date: 05/26/1990    Years since quitting: 33.1   Smokeless tobacco: Never  Vaping Use   Vaping status: Never Used  Substance and Sexual Activity   Alcohol use: Yes    Comment: occ   Drug use: No   Sexual activity: Not Currently    Partners: Male    Birth control/protection: Post-menopausal  Other Topics  Concern   Not on file  Social History Narrative   Not on file   Social Drivers of Health   Financial Resource Strain: Patient Declined (05/27/2023)   Overall Financial Resource Strain (CARDIA)    Difficulty of Paying Living Expenses: Patient declined  Recent Concern: Financial Resource Strain - Medium Risk (03/26/2023)   Overall Financial Resource Strain (CARDIA)    Difficulty of Paying Living Expenses: Somewhat hard  Food Insecurity: Patient Declined (05/27/2023)   Hunger Vital Sign    Worried About Running Out of Food in the Last Year: Patient declined    Ran Out of Food in the Last Year: Patient declined  Recent Concern: Food Insecurity - Food Insecurity Present (03/26/2023)   Hunger Vital Sign    Worried About Running Out of Food in the Last Year: Sometimes true    Ran Out of Food in the Last Year: Sometimes true  Transportation Needs: No Transportation Needs (05/27/2023)   PRAPARE - Administrator, Civil Service (Medical): No    Lack of Transportation (Non-Medical): No  Physical Activity: Unknown (05/27/2023)   Exercise Vital Sign    Days of Exercise per Week: 0 days    Minutes of Exercise per Session: Not on file  Stress: Stress Concern Present (05/27/2023)   [employer] of Occupational Health - Occupational Stress Questionnaire    Feeling of Stress : Rather much  Social Connections: Unknown (05/27/2023)   Social Connection and Isolation Panel [NHANES]    Frequency of Communication with Friends and Family: Patient declined    Frequency of Social Gatherings with Friends and Family: Patient declined    Attends Religious Services: Patient declined    Database administrator or Organizations: No    Attends Engineer, structural: Not on file    Marital Status: Never married  Recent Concern: Social Connections - Moderately Isolated (03/26/2023)   Social Connection and Isolation Panel [NHANES]    Frequency of Communication with Friends and Family:  Three times a week    Frequency of Social Gatherings with Friends and Family: Never    Attends Religious Services: 1 to 4 times per year    Active Member of [employer] or Organizations: No    Attends Engineer, structural: Not on file    Marital Status: Never married  Intimate Partner Violence: Not on file     Outpatient Medications Prior to Visit  Medication Sig Dispense Refill   acetaminophen (TYLENOL) 650 MG CR tablet Take 650 mg by mouth every 8 (eight) hours as needed for pain.  albuterol (PROVENTIL) (2.5 MG/3ML) 0.083% nebulizer solution Take 3 mLs (2.5 mg total) by nebulization every 6 (six) hours as needed for wheezing or shortness of breath. 150 mL 1   albuterol (VENTOLIN HFA) 108 (90 Base) MCG/ACT inhaler Inhale 2 puffs into the lungs every 6 (six) hours as needed for wheezing or shortness of breath. 8 g 2   Alum & Mag Hydroxide-Simeth (ANTACID ANTI-GAS PO) Take 1 tablet by mouth daily as needed (for gas).     Ascorbic Acid (VITAMIN C) 1000 MG tablet Take 1,000 mg by mouth 2 (two) times daily.     aspirin EC 81 MG tablet Take 81 mg by mouth daily. Swallow whole.     aspirin-acetaminophen-caffeine (EXCEDRIN MIGRAINE) 250-250-65 MG tablet Take 2 tablets by mouth 2 (two) times daily as needed for headache or migraine.     Atogepant (QULIPTA) 60 MG TABS Take 1 tablet (60 mg total) by mouth daily. 30 tablet 11   b complex vitamins tablet Take 1 tablet by mouth daily.     bismuth subsalicylate (PEPTO-BISMOL) 262 MG/15ML suspension Take 30 mLs by mouth 4 (four) times daily -  before meals and at bedtime. 360 mL 0   budesonide-formoterol (SYMBICORT) 80-4.5 MCG/ACT inhaler Inhale 2 puffs into the lungs 2 (two) times daily. 1 each 3   Calcium Carbonate-Vitamin D (CALCIUM-D PO) Take 1,200 mg by mouth 2 (two) times daily.     cyclobenzaprine (FLEXERIL) 10 MG tablet Take 1 tablet by mouth three times daily as needed for muscle spasm 30 tablet 0   dicyclomine (BENTYL) 20 MG tablet Take 1  tablet (20 mg total) by mouth 3 (three) times daily before meals. 90 tablet 2   ECHINACEA-GOLDEN SEAL PO Take 1 tablet by mouth 2 (two) times daily as needed (immune system boost).     EPINEPHrine 0.3 mg/0.3 mL IJ SOAJ injection INJECT 0.3ML INTO THE MUSCLE ONCE AS DIRECTED 2 each 0   furosemide (LASIX) 20 MG tablet Take 1 tablet (20 mg total) by mouth daily. 30 tablet 3   guaiFENesin-codeine 100-10 MG/5ML syrup Take 5 mLs by mouth at bedtime as needed for cough. 60 mL 0   hydrochlorothiazide (HYDRODIURIL) 25 MG tablet Take 25 mg by mouth daily.     hydrOXYzine (VISTARIL) 25 MG capsule TAKE 1 CAPSULE BY MOUTH EVERY 8 HOURS AS NEEDED 90 capsule 0   lisinopril (ZESTRIL) 10 MG tablet Take 1 tablet (10 mg total) by mouth daily. 90 tablet 0   Magnesium 250 MG TABS Take 500 mg by mouth at bedtime. Dosage varies     Melatonin 10 MG TABS Take 1 tablet by mouth at bedtime.     meloxicam (MOBIC) 7.5 MG tablet Take 1 tablet (7.5 mg total) by mouth daily. 30 tablet 0   Multiple Vitamin (MULTIVITAMIN WITH MINERALS) TABS tablet Take 1 tablet by mouth daily. With Elderberry     Multiple Vitamins-Minerals (ZINC PO) Take 500 mg by mouth daily as needed (immune system boost).     nystatin (MYCOSTATIN/NYSTOP) powder Apply 1 Application topically 3 (three) times daily. 60 g 1   ondansetron (ZOFRAN) 4 MG tablet TAKE 1 TABLET BY MOUTH EVERY 8 HOURS AS NEEDED FOR NAUSEA FOR VOMITING 30 tablet 0   pantoprazole (PROTONIX) 40 MG tablet Take 1 tablet (40 mg total) by mouth daily. 30 tablet 2   predniSONE (DELTASONE) 10 MG tablet Take 40 mg by mouth on day 1, then taper 10 mg daily until gone 10 tablet 0   propranolol  ER (INDERAL LA) 80 MG 24 hr capsule Take 1 capsule (80 mg total) by mouth daily. 90 capsule 1   rosuvastatin (CRESTOR) 5 MG tablet Take 1 tablet (5 mg total) by mouth at bedtime. 90 tablet 0   sertraline (ZOLOFT) 100 MG tablet Take 1 tablet (100 mg total) by mouth daily. 90 tablet 2   sertraline (ZOLOFT) 50 MG  tablet Take 1 tablet (50 mg total) by mouth daily. 30 tablet 1   traZODone (DESYREL) 100 MG tablet Take 1 tablet (100 mg total) by mouth at bedtime. 90 tablet 1   Ubrogepant (UBRELVY) 100 MG TABS Take 1 tablet by mouth at onset of headache. May repeat dose x 1 after 2 hours if needed. Max 200 mg/ 24 hours. 30 tablet 1   Vitamin D, Ergocalciferol, (DRISDOL) 1.25 MG (50000 UNIT) CAPS capsule 1 po q Wed and 1 po q Sun 8 capsule 0   vitamin k 100 MCG tablet Take 100 mcg by mouth daily.     No facility-administered medications prior to visit.    Allergies  Allergen Reactions   Benzalkonium Chloride Anaphylaxis and Hives   Neosporin [Neomycin-Bacitracin Zn-Polymyx] Anaphylaxis and Hives   Polysporin [Bacitracin-Polymyxin B] Hives   Bee Venom Hives and Swelling    Throat swelling   Bupropion Hcl Other (See Comments)    angry   Cephalexin Hives and Swelling   Doxycycline Itching    05/2023   Latex     ROS Review of Systems Negative unless indicated in HPI.    Objective:    Physical Exam Constitutional:      Appearance: Normal appearance.  Cardiovascular:     Rate and Rhythm: Normal rate and regular rhythm.     Pulses: Normal pulses.     Heart sounds: Normal heart sounds.  Pulmonary:     Effort: Pulmonary effort is normal.     Breath sounds: Normal breath sounds.  Abdominal:     Tenderness: There is no abdominal tenderness.  Musculoskeletal:     Cervical back: Normal range of motion.  Neurological:     General: No focal deficit present.     Mental Status: She is alert. Mental status is at baseline.  Psychiatric:        Mood and Affect: Mood normal.        Behavior: Behavior normal.        Thought Content: Thought content normal.        Judgment: Judgment normal.     BP 118/82   Pulse 83   Temp 98.4 F (36.9 C)   Ht 5\' 7"  (1.702 m)   Wt (!) 374 lb 3.2 oz (169.7 kg)   LMP 07/11/2016   SpO2 95%   BMI 58.61 kg/m  Wt Readings from Last 3 Encounters:  07/11/23 (!)  374 lb 3.2 oz (169.7 kg)  07/03/23 (!) 368 lb 3.2 oz (167 kg)  06/12/23 (!) 368 lb 3.2 oz (167 kg)     Health Maintenance  Topic Date Due   Pneumococcal Vaccine 69-27 Years old (1 of 2 - PCV) Never done   Hepatitis C Screening  Never done   Zoster Vaccines- Shingrix (1 of 2) Never done   COVID-19 Vaccine (1 - 2024-25 season) 07/19/2023 (Originally 02/09/2023)   INFLUENZA VACCINE  09/08/2023 (Originally 01/09/2023)   MAMMOGRAM  09/05/2024   DTaP/Tdap/Td (2 - Td or Tdap) 06/25/2027   Cervical Cancer Screening (HPV/Pap Cotest)  09/02/2027   Colonoscopy  09/08/2032   HIV Screening  Completed   HPV VACCINES  Aged Out    There are no preventive care reminders to display for this patient.  Lab Results  Component Value Date   TSH 2.85 03/28/2023   Lab Results  Component Value Date   WBC 7.7 04/23/2023   HGB 14.1 04/23/2023   HCT 42.4 04/23/2023   MCV 94.0 04/23/2023   PLT 259.0 04/23/2023   Lab Results  Component Value Date   NA 139 03/28/2023   K 4.0 03/28/2023   CO2 27 03/28/2023   GLUCOSE 137 (H) 03/28/2023   BUN 14 03/28/2023   CREATININE 0.81 03/28/2023   BILITOT 0.8 03/28/2023   ALKPHOS 40 03/28/2023   AST 27 03/28/2023   ALT 27 03/28/2023   PROT 7.4 03/28/2023   ALBUMIN 4.3 03/28/2023   CALCIUM 9.4 03/28/2023   ANIONGAP 10 08/02/2017   EGFR 86 02/11/2023   GFR 83.33 03/28/2023   Lab Results  Component Value Date   CHOL 245 (H) 02/11/2023   Lab Results  Component Value Date   HDL 50 02/11/2023   Lab Results  Component Value Date   LDLCALC 170 (H) 02/11/2023   Lab Results  Component Value Date   TRIG 138 02/11/2023   Lab Results  Component Value Date   CHOLHDL 5 07/18/2022   Lab Results  Component Value Date   HGBA1C 5.7 (H) 02/11/2023      Assessment & Plan:  Migraine without aura and without status migrainosus, not intractable Assessment & Plan: Left-sided headache with neck pain, light sensitivity, and blurry vision. -Continue Ubrelvy as  needed, but not to exceed 200mg /day. -Take magnesium supplement daily.  -Followed by neurology.  -Work noted provided.     Follow-up: No follow-ups on file.   Kara Dies, NP

## 2023-07-13 NOTE — Assessment & Plan Note (Signed)
Left-sided headache with neck pain, light sensitivity, and blurry vision. -Continue Ubrelvy as needed, but not to exceed 200mg /day. -Take magnesium supplement daily.  -Followed by neurology.  -Work noted provided.

## 2023-07-28 ENCOUNTER — Ambulatory Visit: Payer: BC Managed Care – PPO | Admitting: Orthopedic Surgery

## 2023-07-29 ENCOUNTER — Encounter: Payer: Self-pay | Admitting: Nurse Practitioner

## 2023-07-29 ENCOUNTER — Ambulatory Visit: Payer: BC Managed Care – PPO | Admitting: Nurse Practitioner

## 2023-07-29 VITALS — BP 130/84 | HR 88 | Temp 98.3°F | Ht 67.0 in | Wt 370.8 lb

## 2023-07-29 DIAGNOSIS — J309 Allergic rhinitis, unspecified: Secondary | ICD-10-CM | POA: Insufficient documentation

## 2023-07-29 DIAGNOSIS — G43009 Migraine without aura, not intractable, without status migrainosus: Secondary | ICD-10-CM | POA: Diagnosis not present

## 2023-07-29 NOTE — Assessment & Plan Note (Signed)
 Symptoms include sneezing, coughing, and congestion. Flonase is being used inconsistently. Start daily Zyrtec to help control symptoms. Continue Flonase as previously prescribed. Return precautions given to patient.

## 2023-07-29 NOTE — Assessment & Plan Note (Signed)
 The current migraine episode began yesterday, accompanied by sensitivity to light, sound, and movement, as well as nausea. Bernita Raisin and Zofran are available for use. An MRI was negative. Insurance did not approve [place]. Triggers include stress and weather changes. Continue Ubrelvy and Zofran as needed. Maintain Propranolol as previously prescribed. A neurology follow-up is scheduled for the first week of March to discuss alternative preventive treatments. Intermittent leave paperwork completed for work. Return precautions given to patient.

## 2023-07-29 NOTE — Progress Notes (Signed)
 Danielle Dicker, NP-C Phone: [phone]  Danielle Irwin is a 53 y.o. female who presents today for migraine.  Discussed the use of AI scribe software for clinical note transcription with the patient, who gave verbal consent to proceed.  History of Present Illness   Danielle Irwin is a 53 year old female with chronic migraines who presents with an acute migraine episode.  She is experiencing an acute migraine episode that began upon waking yesterday. She took two doses of Ubrelvy yesterday but has not taken any today as she has not been awake for long. She plans to take at least one dose today when she can rest. Her migraines typically last two to three days if she wakes up with one, but if she can address it early, it may last one to two days. If a migraine persists beyond three days, she seeks medical attention.  She has a history of chronic migraines and has previously seen a neurologist. An MRI was performed and returned negative. She was prescribed Qulipta, but her insurance did not approve it, so she has not started this medication. She continues to use Ubrelvy and propranolol for migraine management. She also uses Zofran for nausea associated with her migraines.  Her migraines are accompanied by photophobia, phonophobia, nausea, and 'stabbing pain' in the eyes. She identifies stress and weather changes as triggers, noting that the current episode may have been triggered by recent weather changes, including a cold front and pressure changes.  She is currently experiencing symptoms of a cold or allergies, including sneezing, coughing, and congestion, but does not have flu-like symptoms. She uses Flonase but does not remember to use it daily.      Social History   Tobacco Use  Smoking Status Former   Current packs/day: 0.00   Types: Cigarettes   Quit date: 05/26/1990   Years since quitting: 33.1  Smokeless Tobacco Never    Current Outpatient Medications on File Prior to Visit   Medication Sig Dispense Refill   acetaminophen (TYLENOL) 650 MG CR tablet Take 650 mg by mouth every 8 (eight) hours as needed for pain.     albuterol (PROVENTIL) (2.5 MG/3ML) 0.083% nebulizer solution Take 3 mLs (2.5 mg total) by nebulization every 6 (six) hours as needed for wheezing or shortness of breath. 150 mL 1   albuterol (VENTOLIN HFA) 108 (90 Base) MCG/ACT inhaler Inhale 2 puffs into the lungs every 6 (six) hours as needed for wheezing or shortness of breath. 8 g 2   Alum & Mag Hydroxide-Simeth (ANTACID ANTI-GAS PO) Take 1 tablet by mouth daily as needed (for gas).     Ascorbic Acid (VITAMIN C) 1000 MG tablet Take 1,000 mg by mouth 2 (two) times daily.     aspirin EC 81 MG tablet Take 81 mg by mouth daily. Swallow whole.     aspirin-acetaminophen-caffeine (EXCEDRIN MIGRAINE) 250-250-65 MG tablet Take 2 tablets by mouth 2 (two) times daily as needed for headache or migraine.     Atogepant (QULIPTA) 60 MG TABS Take 1 tablet (60 mg total) by mouth daily. 30 tablet 11   b complex vitamins tablet Take 1 tablet by mouth daily.     bismuth subsalicylate (PEPTO-BISMOL) 262 MG/15ML suspension Take 30 mLs by mouth 4 (four) times daily -  before meals and at bedtime. 360 mL 0   budesonide-formoterol (SYMBICORT) 80-4.5 MCG/ACT inhaler Inhale 2 puffs into the lungs 2 (two) times daily. 1 each 3   Calcium Carbonate-Vitamin D (CALCIUM-D PO) Take  1,200 mg by mouth 2 (two) times daily.     cyclobenzaprine (FLEXERIL) 10 MG tablet Take 1 tablet by mouth three times daily as needed for muscle spasm 30 tablet 0   dicyclomine (BENTYL) 20 MG tablet Take 1 tablet (20 mg total) by mouth 3 (three) times daily before meals. 90 tablet 2   ECHINACEA-GOLDEN SEAL PO Take 1 tablet by mouth 2 (two) times daily as needed (immune system boost).     EPINEPHrine 0.3 mg/0.3 mL IJ SOAJ injection INJECT 0.3ML INTO THE MUSCLE ONCE AS DIRECTED 2 each 0   furosemide (LASIX) 20 MG tablet Take 1 tablet (20 mg total) by mouth daily.  30 tablet 3   guaiFENesin-codeine 100-10 MG/5ML syrup Take 5 mLs by mouth at bedtime as needed for cough. 60 mL 0   hydrochlorothiazide (HYDRODIURIL) 25 MG tablet Take 25 mg by mouth daily.     hydrOXYzine (VISTARIL) 25 MG capsule TAKE 1 CAPSULE BY MOUTH EVERY 8 HOURS AS NEEDED 90 capsule 0   lisinopril (ZESTRIL) 10 MG tablet Take 1 tablet (10 mg total) by mouth daily. 90 tablet 0   Magnesium 250 MG TABS Take 500 mg by mouth at bedtime. Dosage varies     Melatonin 10 MG TABS Take 1 tablet by mouth at bedtime.     meloxicam (MOBIC) 7.5 MG tablet Take 1 tablet (7.5 mg total) by mouth daily. 30 tablet 0   Multiple Vitamin (MULTIVITAMIN WITH MINERALS) TABS tablet Take 1 tablet by mouth daily. With Elderberry     Multiple Vitamins-Minerals (ZINC PO) Take 500 mg by mouth daily as needed (immune system boost).     nystatin (MYCOSTATIN/NYSTOP) powder Apply 1 Application topically 3 (three) times daily. 60 g 1   ondansetron (ZOFRAN) 4 MG tablet TAKE 1 TABLET BY MOUTH EVERY 8 HOURS AS NEEDED FOR NAUSEA FOR VOMITING 30 tablet 0   pantoprazole (PROTONIX) 40 MG tablet Take 1 tablet (40 mg total) by mouth daily. 30 tablet 2   predniSONE (DELTASONE) 10 MG tablet Take 40 mg by mouth on day 1, then taper 10 mg daily until gone 10 tablet 0   propranolol ER (INDERAL LA) 80 MG 24 hr capsule Take 1 capsule (80 mg total) by mouth daily. 90 capsule 1   rosuvastatin (CRESTOR) 5 MG tablet Take 1 tablet (5 mg total) by mouth at bedtime. 90 tablet 0   sertraline (ZOLOFT) 100 MG tablet Take 1 tablet (100 mg total) by mouth daily. 90 tablet 2   sertraline (ZOLOFT) 50 MG tablet Take 1 tablet (50 mg total) by mouth daily. 30 tablet 1   traZODone (DESYREL) 100 MG tablet Take 1 tablet (100 mg total) by mouth at bedtime. 90 tablet 1   Ubrogepant (UBRELVY) 100 MG TABS Take 1 tablet by mouth at onset of headache. May repeat dose x 1 after 2 hours if needed. Max 200 mg/ 24 hours. 30 tablet 1   Vitamin D, Ergocalciferol, (DRISDOL)  1.25 MG (50000 UNIT) CAPS capsule 1 po q Wed and 1 po q Sun 8 capsule 0   vitamin k 100 MCG tablet Take 100 mcg by mouth daily.     No current facility-administered medications on file prior to visit.    ROS see history of present illness  Objective  Physical Exam Vitals:   07/29/23 1111  BP: 130/84  Pulse: 88  Temp: 98.3 F (36.8 C)  SpO2: 96%    BP Readings from Last 3 Encounters:  07/29/23 130/84  07/11/23 118/82  07/03/23 114/72   Wt Readings from Last 3 Encounters:  07/29/23 (!) 370 lb 12.8 oz (168.2 kg)  07/11/23 (!) 374 lb 3.2 oz (169.7 kg)  07/03/23 (!) 368 lb 3.2 oz (167 kg)    Physical Exam Constitutional:      General: She is not in acute distress.    Appearance: Normal appearance.  HENT:     Head: Normocephalic.  Cardiovascular:     Rate and Rhythm: Normal rate and regular rhythm.     Heart sounds: Normal heart sounds.  Pulmonary:     Effort: Pulmonary effort is normal.     Breath sounds: Normal breath sounds.  Skin:    General: Skin is warm and dry.  Neurological:     General: No focal deficit present.     Mental Status: She is alert.  Psychiatric:        Mood and Affect: Mood normal.        Behavior: Behavior normal.     Assessment/Plan: Please see individual problem list.  Migraine without aura and without status migrainosus, not intractable Assessment & Plan: The current migraine episode began yesterday, accompanied by sensitivity to light, sound, and movement, as well as nausea. Bernita Raisin and Zofran are available for use. An MRI was negative. Insurance did not approve [place]. Triggers include stress and weather changes. Continue Ubrelvy and Zofran as needed. Maintain Propranolol as previously prescribed. A neurology follow-up is scheduled for the first week of March to discuss alternative preventive treatments. Intermittent leave paperwork completed for work. Return precautions given to patient.    Allergic rhinitis, unspecified  seasonality, unspecified trigger Assessment & Plan: Symptoms include sneezing, coughing, and congestion. Flonase is being used inconsistently. Start daily Zyrtec to help control symptoms. Continue Flonase as previously prescribed. Return precautions given to patient.       Return if symptoms worsen or fail to improve.   Danielle Dicker, NP-C [facility]

## 2023-08-11 ENCOUNTER — Ambulatory Visit: Payer: BC Managed Care – PPO | Admitting: Orthopedic Surgery

## 2023-08-13 NOTE — Progress Notes (Deleted)
 NEUROLOGY FOLLOW UP OFFICE NOTE  [facility]STHOFF [id number]  Assessment/Plan:   Chronic migraine with aura, without status migrainosus, intractable  As she has had change in her migraines, now frequent, more severe and in status migrainosus, will check MRI of brain with and without contrast to rule out secondary intracranial etiology. Migraine prevention:  Start Qulipta 60mg  daily Migraine rescue:  Advised to use the [place] as the first line at earliest onset of migraine.  Zofran at earliest onset as well.   Limit use of pain relievers to no more than 2 days out of week to prevent risk of rebound or medication-overuse headache. Keep headache diary Follow up 3 months.      Subjective:  Tris RAYAH FINES is a 53 year old right-handed female HTN, fibromyalgia, arthritis, asthma, PCOS, depression and anxiety who follows up for migraines.  MRI of brain personally reviewed.  UPDATE: 06/09/2023 MRI BRAIN W WO:  1. No evidence of an acute intracranial abnormality. 2. Multifocal T2 FLAIR hyperintense signal abnormality within the cerebral white matter, overall mild but greater than expected for age. Findings have progressed from the prior brain MRI of 05/07/2011. These signal changes are nonspecific and differential considerations include chronic small vessel ischemic disease, sequela of chronic migraine headaches, sequela of a prior infectious/inflammatory process and sequela of demyelinating disease, among others. 3. Unchanged 6 mm pineal cyst. 4. Minimal fluid within the right mastoid air cells.  Started Qulipta.   *** Intensity:  *** Duration:  *** Frequency:  *** Frequency of abortive medication: *** Rescue protocol:  Excedrin or ibuprofen with Tylenol first line.  Uses Ubrelvy as second line. Current NSAIDS/analgesics:  Excedrin Migraine, ibuprofen with Tylenol, ASA 81mg  daily, meloxicam Current triptans:  none Current ergotamine:  none Current anti-emetic:  ondansetron 4mg  Current  muscle relaxants:  cyclobenzaprine 10mg  Current Antihypertensive medications:  propranolol ER 80mg  daily, lisinopril, hydrochlorothiazide, Lasix Current Antidepressant medications:  sertraline 100mg  daily Current Anticonvulsant medications:  none Current anti-CGRP:  Ubrelvy 100mg , Qulipta 60mg  daily Current Vitamins/Herbal/Supplements:  magnesium, melatonin, B complex, C, D, MVI Current Antihistamines/Decongestants:  none Other therapy:  none Other medications:  trazodone 100mg  at bedtime (insomnia),  hydroxyzine (anxiety), albuterol   Caffeine:  occasional coffee (aggravates IBS), rarely soda (2-3 a week) Diet:  Flavored water.  Skips meals Exercise:  no Depression/Anxiety:  yes.  Father just passed away Other pain:  fibromyalgia, degenerative disc disease Sleep hygiene:  poor.  Trouble falling asleep and staying asleep.  Has OSA.  Uses CPAP.  Uses trazodone - not helpful.  Average 5-6 hours a night  HISTORY: Onset:  off and on since teenager.  Frequent and more severe over last 4-6 months Location:  left hemisphere, left eye, sometimes bilateral Quality:  stabbing in eye, pounding and squeezing of head Intensity:  4-5/10, 8/10 when severe.  She denies new headache, thunderclap headache or severe headache that wakes her from sleep. Aura:  Occasionally visual aura with wavy lines around objects lasting 1-2 days (occurs during migraine and lasting 1-2 days) Prodrome:  myofascial pain in shoulders and neck Associated symptoms:  nausea, photophobia, phonophobia, sometimes dizziness/vertigo, occasionally vomiting.  She denies associated unilateral numbness or weakness. Duration:  3-10 days (usually 1 week) Frequency:  5-6 in last 2 months lasting days to week Frequency of abortive medication: daily (also treating fibromyalgia and degenerative disc disease) Triggers:  fluoride, stress, change in weather Relieving factors:  ice pack Activity:  movement aggravates. Missed work 12-14 days in  last month.  From teenager to 94s, had complete black out of vision for 20-30 minutes.  Not had for years.  Likely migraine aura.    She does get routine eye exams.    MRI of brain without contrast on 05/07/2011 personally reviewed revealed mild scattered nonspecific tiny white matter hyperintensities in the bilateral cerebral hemispheres as well as small 8 mm pineal gland cyst but no acute findings.    Past NSAIDS/analgesics:  naproxen Past abortive triptans:  sumatriptan tab Past abortive ergotamine:  none Past muscle relaxants:  methocarbamol, metaxalone Past anti-emetic:  none Past antihypertensive medications:  none Past antidepressant medications:  duloxetine Past anticonvulsant medications:  pregablin (blurred vision) Past anti-CGRP:  none Past antihistamines/decongestants:  Sudafed, Benadryl, Flonase Other past therapies:  none   Family history of headache:  no Other family history:  Father (Parkinson's disease)  PAST MEDICAL HISTORY: Past Medical History:  Diagnosis Date   Anemia    Anxiety    Asthma    Back pain    Depression    Edema of both lower extremities    Epstein Barr infection    Family history of adverse reaction to anesthesia    FATHER WAS SLOW TO WAKE UP   Fibromyalgia    GERD (gastroesophageal reflux disease)    H/O blood clots    Headache    OCCASIONAL MIGRAINES   History of fibromyalgia    HNP (herniated nucleus pulposus), lumbar    Hx of epistaxis    Hypertension    Insomnia    Joint pain    OA (osteoarthritis)    Obesity    PCOS (polycystic ovarian syndrome)    Sensitive skin    Sleep apnea    Vitamin D deficiency     MEDICATIONS: Current Outpatient Medications on File Prior to Visit  Medication Sig Dispense Refill   acetaminophen (TYLENOL) 650 MG CR tablet Take 650 mg by mouth every 8 (eight) hours as needed for pain.     albuterol (PROVENTIL) (2.5 MG/3ML) 0.083% nebulizer solution Take 3 mLs (2.5 mg total) by nebulization every  6 (six) hours as needed for wheezing or shortness of breath. 150 mL 1   albuterol (VENTOLIN HFA) 108 (90 Base) MCG/ACT inhaler Inhale 2 puffs into the lungs every 6 (six) hours as needed for wheezing or shortness of breath. 8 g 2   Alum & Mag Hydroxide-Simeth (ANTACID ANTI-GAS PO) Take 1 tablet by mouth daily as needed (for gas).     Ascorbic Acid (VITAMIN C) 1000 MG tablet Take 1,000 mg by mouth 2 (two) times daily.     aspirin EC 81 MG tablet Take 81 mg by mouth daily. Swallow whole.     aspirin-acetaminophen-caffeine (EXCEDRIN MIGRAINE) 250-250-65 MG tablet Take 2 tablets by mouth 2 (two) times daily as needed for headache or migraine.     Atogepant (QULIPTA) 60 MG TABS Take 1 tablet (60 mg total) by mouth daily. 30 tablet 11   b complex vitamins tablet Take 1 tablet by mouth daily.     bismuth subsalicylate (PEPTO-BISMOL) 262 MG/15ML suspension Take 30 mLs by mouth 4 (four) times daily -  before meals and at bedtime. 360 mL 0   budesonide-formoterol (SYMBICORT) 80-4.5 MCG/ACT inhaler Inhale 2 puffs into the lungs 2 (two) times daily. 1 each 3   Calcium Carbonate-Vitamin D (CALCIUM-D PO) Take 1,200 mg by mouth 2 (two) times daily.     cyclobenzaprine (FLEXERIL) 10 MG tablet Take 1 tablet by mouth three times daily as needed for  muscle spasm 30 tablet 0   dicyclomine (BENTYL) 20 MG tablet Take 1 tablet (20 mg total) by mouth 3 (three) times daily before meals. 90 tablet 2   ECHINACEA-GOLDEN SEAL PO Take 1 tablet by mouth 2 (two) times daily as needed (immune system boost).     EPINEPHrine 0.3 mg/0.3 mL IJ SOAJ injection INJECT 0.3ML INTO THE MUSCLE ONCE AS DIRECTED 2 each 0   furosemide (LASIX) 20 MG tablet Take 1 tablet (20 mg total) by mouth daily. 30 tablet 3   guaiFENesin-codeine 100-10 MG/5ML syrup Take 5 mLs by mouth at bedtime as needed for cough. 60 mL 0   hydrochlorothiazide (HYDRODIURIL) 25 MG tablet Take 25 mg by mouth daily.     hydrOXYzine (VISTARIL) 25 MG capsule TAKE 1 CAPSULE BY  MOUTH EVERY 8 HOURS AS NEEDED 90 capsule 0   lisinopril (ZESTRIL) 10 MG tablet Take 1 tablet (10 mg total) by mouth daily. 90 tablet 0   Magnesium 250 MG TABS Take 500 mg by mouth at bedtime. Dosage varies     Melatonin 10 MG TABS Take 1 tablet by mouth at bedtime.     meloxicam (MOBIC) 7.5 MG tablet Take 1 tablet (7.5 mg total) by mouth daily. 30 tablet 0   Multiple Vitamin (MULTIVITAMIN WITH MINERALS) TABS tablet Take 1 tablet by mouth daily. With Elderberry     Multiple Vitamins-Minerals (ZINC PO) Take 500 mg by mouth daily as needed (immune system boost).     nystatin (MYCOSTATIN/NYSTOP) powder Apply 1 Application topically 3 (three) times daily. 60 g 1   ondansetron (ZOFRAN) 4 MG tablet TAKE 1 TABLET BY MOUTH EVERY 8 HOURS AS NEEDED FOR NAUSEA FOR VOMITING 30 tablet 0   pantoprazole (PROTONIX) 40 MG tablet Take 1 tablet (40 mg total) by mouth daily. 30 tablet 2   predniSONE (DELTASONE) 10 MG tablet Take 40 mg by mouth on day 1, then taper 10 mg daily until gone 10 tablet 0   propranolol ER (INDERAL LA) 80 MG 24 hr capsule Take 1 capsule (80 mg total) by mouth daily. 90 capsule 1   rosuvastatin (CRESTOR) 5 MG tablet Take 1 tablet (5 mg total) by mouth at bedtime. 90 tablet 0   sertraline (ZOLOFT) 100 MG tablet Take 1 tablet (100 mg total) by mouth daily. 90 tablet 2   sertraline (ZOLOFT) 50 MG tablet Take 1 tablet (50 mg total) by mouth daily. 30 tablet 1   traZODone (DESYREL) 100 MG tablet Take 1 tablet (100 mg total) by mouth at bedtime. 90 tablet 1   Ubrogepant (UBRELVY) 100 MG TABS Take 1 tablet by mouth at onset of headache. May repeat dose x 1 after 2 hours if needed. Max 200 mg/ 24 hours. 30 tablet 1   Vitamin D, Ergocalciferol, (DRISDOL) 1.25 MG (50000 UNIT) CAPS capsule 1 po q Wed and 1 po q Sun 8 capsule 0   vitamin k 100 MCG tablet Take 100 mcg by mouth daily.     No current facility-administered medications on file prior to visit.    ALLERGIES: Allergies  Allergen Reactions    Benzalkonium Chloride Anaphylaxis and Hives   Neosporin [Neomycin-Bacitracin Zn-Polymyx] Anaphylaxis and Hives   Polysporin [Bacitracin-Polymyxin B] Hives   Bee Venom Hives and Swelling    Throat swelling   Bupropion Hcl Other (See Comments)    angry   Cephalexin Hives and Swelling   Doxycycline Itching    05/2023   Latex     FAMILY HISTORY: Family History  Problem Relation Age of Onset   Diabetes Mother    High blood pressure Mother    High Cholesterol Mother    Depression Mother    Anxiety disorder Mother    Sleep apnea Mother    Obesity Mother    Heart attack Father 76   High blood pressure Father    High Cholesterol Father    Heart disease Father    Heart attack Brother    Breast cancer Maternal Aunt 50   Ovarian cancer Neg Hx       Objective:  *** General: No acute distress.  Patient appears ***-groomed.   Head:  Normocephalic/atraumatic Eyes:  Fundi examined but not visualized Neck: supple, no paraspinal tenderness, full range of motion Heart:  Regular rate and rhythm Lungs:  Clear to auscultation bilaterally Back: No paraspinal tenderness Neurological Exam: alert and oriented.  Speech fluent and not dysarthric, language intact.  CN II-XII intact. Bulk and tone normal, muscle strength 5/5 throughout.  Sensation to light touch intact.  Deep tendon reflexes 2+ throughout, toes downgoing.  Finger to nose testing intact.  Gait normal, Romberg negative.   Shon Millet, DO  CC: ***

## 2023-08-14 ENCOUNTER — Ambulatory Visit: Payer: BC Managed Care – PPO | Admitting: Nurse Practitioner

## 2023-08-14 ENCOUNTER — Ambulatory Visit: Payer: BC Managed Care – PPO | Admitting: Neurology

## 2023-08-25 ENCOUNTER — Ambulatory Visit: Admitting: Orthopedic Surgery

## 2023-08-28 ENCOUNTER — Other Ambulatory Visit (INDEPENDENT_AMBULATORY_CARE_PROVIDER_SITE_OTHER): Payer: Self-pay | Admitting: Family Medicine

## 2023-08-28 ENCOUNTER — Telehealth: Payer: Self-pay

## 2023-08-28 ENCOUNTER — Other Ambulatory Visit: Payer: Self-pay | Admitting: Physician Assistant

## 2023-08-28 ENCOUNTER — Other Ambulatory Visit: Payer: Self-pay | Admitting: Primary Care

## 2023-08-28 ENCOUNTER — Other Ambulatory Visit: Payer: Self-pay | Admitting: Nurse Practitioner

## 2023-08-28 DIAGNOSIS — E782 Mixed hyperlipidemia: Secondary | ICD-10-CM

## 2023-08-28 DIAGNOSIS — R112 Nausea with vomiting, unspecified: Secondary | ICD-10-CM

## 2023-08-28 DIAGNOSIS — K219 Gastro-esophageal reflux disease without esophagitis: Secondary | ICD-10-CM

## 2023-08-28 DIAGNOSIS — I10 Essential (primary) hypertension: Secondary | ICD-10-CM

## 2023-08-28 DIAGNOSIS — R109 Unspecified abdominal pain: Secondary | ICD-10-CM

## 2023-08-28 NOTE — Telephone Encounter (Signed)
 Left message to call back so we can find out if she requested a refill on the Doxycycline and if so why? Okay to find out if the Patient calls back.

## 2023-09-02 NOTE — Progress Notes (Unsigned)
 NEUROLOGY FOLLOW UP OFFICE NOTE  PASHA GADISON [id number]  Assessment/Plan:   Chronic migraine with aura, without status migrainosus, intractable  Migraine prevention:  Qulipta 60mg  daily *** Migraine rescue:  Ubrelvy 100mg  ***.  Zofran at earliest onset as well.   Limit use of pain relievers to no more than 2 days out of week to prevent risk of rebound or medication-overuse headache. Keep headache diary Follow up ***      Subjective:  Naama MAYLEIGH TETRAULT is a 53 year old right-handed female HTN, fibromyalgia, arthritis, asthma, PCOS, depression and anxiety who follows up for migraines.  MRI of brain personally reviewed.  UPDATE: 06/09/2023 MRI BRAIN W WO:  1. No evidence of an acute intracranial abnormality. 2. Multifocal T2 FLAIR hyperintense signal abnormality within the cerebral white matter, overall mild but greater than expected for age. Findings have progressed from the prior brain MRI of 05/07/2011. These signal changes are nonspecific and differential considerations include chronic small vessel ischemic disease, sequela of chronic migraine headaches, sequela of a prior infectious/inflammatory process and sequela of demyelinating disease, among others. 3. Unchanged 6 mm pineal cyst. 4. Minimal fluid within the right mastoid air cells.  Started Qulipta.   *** Intensity:  *** Duration:  *** Frequency:  *** Frequency of abortive medication: *** Rescue protocol:  Excedrin or ibuprofen with Tylenol first line.  Uses Ubrelvy as second line. Current NSAIDS/analgesics:  Excedrin Migraine, ibuprofen with Tylenol, ASA 81mg  daily, meloxicam Current triptans:  none Current ergotamine:  none Current anti-emetic:  ondansetron 4mg  Current muscle relaxants:  cyclobenzaprine 10mg  Current Antihypertensive medications:  propranolol ER 80mg  daily, lisinopril, hydrochlorothiazide, Lasix Current Antidepressant medications:  sertraline 100mg  daily Current Anticonvulsant medications:   none Current anti-CGRP:  Ubrelvy 100mg , Qulipta 60mg  daily Current Vitamins/Herbal/Supplements:  magnesium, melatonin, B complex, C, D, MVI Current Antihistamines/Decongestants:  none Other therapy:  none Other medications:  trazodone 100mg  at bedtime (insomnia),  hydroxyzine (anxiety), albuterol   Caffeine:  occasional coffee (aggravates IBS), rarely soda (2-3 a week) Diet:  Flavored water.  Skips meals Exercise:  no Depression/Anxiety:  yes.  Father just passed away Other pain:  fibromyalgia, degenerative disc disease Sleep hygiene:  poor.  Trouble falling asleep and staying asleep.  Has OSA.  Uses CPAP.  Uses trazodone - not helpful.  Average 5-6 hours a night  HISTORY: Onset:  off and on since teenager.  Frequent and more severe over last 4-6 months Location:  left hemisphere, left eye, sometimes bilateral Quality:  stabbing in eye, pounding and squeezing of head Intensity:  4-5/10, 8/10 when severe.  She denies new headache, thunderclap headache or severe headache that wakes her from sleep. Aura:  Occasionally visual aura with wavy lines around objects lasting 1-2 days (occurs during migraine and lasting 1-2 days) Prodrome:  myofascial pain in shoulders and neck Associated symptoms:  nausea, photophobia, phonophobia, sometimes dizziness/vertigo, occasionally vomiting.  She denies associated unilateral numbness or weakness. Duration:  3-10 days (usually 1 week) Frequency:  5-6 in last 2 months lasting days to week Frequency of abortive medication: daily (also treating fibromyalgia and degenerative disc disease) Triggers:  fluoride, stress, change in weather Relieving factors:  ice pack Activity:  movement aggravates. Missed work 12-14 days in last month.   From teenager to 39s, had complete black out of vision for 20-30 minutes.  Not had for years.  Likely migraine aura.    She does get routine eye exams.    MRI of brain without contrast on 05/07/2011 personally reviewed  revealed mild scattered nonspecific tiny white matter hyperintensities in the bilateral cerebral hemispheres as well as small 8 mm pineal gland cyst but no acute findings.    Past NSAIDS/analgesics:  naproxen Past abortive triptans:  sumatriptan tab Past abortive ergotamine:  none Past muscle relaxants:  methocarbamol, metaxalone Past anti-emetic:  none Past antihypertensive medications:  none Past antidepressant medications:  duloxetine Past anticonvulsant medications:  pregablin (blurred vision) Past anti-CGRP:  none Past antihistamines/decongestants:  Sudafed, Benadryl, Flonase Other past therapies:  none   Family history of headache:  no Other family history:  Father (Parkinson's disease)  PAST MEDICAL HISTORY: Past Medical History:  Diagnosis Date   Anemia    Anxiety    Asthma    Back pain    Depression    Edema of both lower extremities    Epstein Barr infection    Family history of adverse reaction to anesthesia    FATHER WAS SLOW TO WAKE UP   Fibromyalgia    GERD (gastroesophageal reflux disease)    H/O blood clots    Headache    OCCASIONAL MIGRAINES   History of fibromyalgia    HNP (herniated nucleus pulposus), lumbar    Hx of epistaxis    Hypertension    Insomnia    Joint pain    OA (osteoarthritis)    Obesity    PCOS (polycystic ovarian syndrome)    Sensitive skin    Sleep apnea    Vitamin D deficiency     MEDICATIONS: Current Outpatient Medications on File Prior to Visit  Medication Sig Dispense Refill   acetaminophen (TYLENOL) 650 MG CR tablet Take 650 mg by mouth every 8 (eight) hours as needed for pain.     albuterol (PROVENTIL) (2.5 MG/3ML) 0.083% nebulizer solution Take 3 mLs (2.5 mg total) by nebulization every 6 (six) hours as needed for wheezing or shortness of breath. 150 mL 1   albuterol (VENTOLIN HFA) 108 (90 Base) MCG/ACT inhaler Inhale 2 puffs into the lungs every 6 (six) hours as needed for wheezing or shortness of breath. 8 g 2   Alum  & Mag Hydroxide-Simeth (ANTACID ANTI-GAS PO) Take 1 tablet by mouth daily as needed (for gas).     Ascorbic Acid (VITAMIN C) 1000 MG tablet Take 1,000 mg by mouth 2 (two) times daily.     aspirin EC 81 MG tablet Take 81 mg by mouth daily. Swallow whole.     aspirin-acetaminophen-caffeine (EXCEDRIN MIGRAINE) 250-250-65 MG tablet Take 2 tablets by mouth 2 (two) times daily as needed for headache or migraine.     Atogepant (QULIPTA) 60 MG TABS Take 1 tablet (60 mg total) by mouth daily. 30 tablet 11   b complex vitamins tablet Take 1 tablet by mouth daily.     bismuth subsalicylate (PEPTO-BISMOL) 262 MG/15ML suspension Take 30 mLs by mouth 4 (four) times daily -  before meals and at bedtime. 360 mL 0   budesonide-formoterol (SYMBICORT) 80-4.5 MCG/ACT inhaler Inhale 2 puffs into the lungs 2 (two) times daily. 1 each 3   Calcium Carbonate-Vitamin D (CALCIUM-D PO) Take 1,200 mg by mouth 2 (two) times daily.     cyclobenzaprine (FLEXERIL) 10 MG tablet Take 1 tablet by mouth three times daily as needed for muscle spasm 30 tablet 0   dicyclomine (BENTYL) 20 MG tablet Take 1 tablet (20 mg total) by mouth 3 (three) times daily as needed for spasms. 90 tablet 11   ECHINACEA-GOLDEN SEAL PO Take 1 tablet by mouth 2 (two)  times daily as needed (immune system boost).     EPINEPHrine 0.3 mg/0.3 mL IJ SOAJ injection INJECT 0.3ML INTO THE MUSCLE ONCE AS DIRECTED 2 each 0   furosemide (LASIX) 20 MG tablet Take 1 tablet (20 mg total) by mouth daily. 30 tablet 3   guaiFENesin-codeine 100-10 MG/5ML syrup Take 5 mLs by mouth at bedtime as needed for cough. 60 mL 0   hydrochlorothiazide (HYDRODIURIL) 25 MG tablet Take 25 mg by mouth daily.     hydrOXYzine (VISTARIL) 25 MG capsule TAKE 1 CAPSULE BY MOUTH EVERY 8 HOURS AS NEEDED 90 capsule 0   lisinopril (ZESTRIL) 10 MG tablet Take 1 tablet by mouth once daily 90 tablet 1   Magnesium 250 MG TABS Take 500 mg by mouth at bedtime. Dosage varies     Melatonin 10 MG TABS Take 1  tablet by mouth at bedtime.     meloxicam (MOBIC) 7.5 MG tablet Take 1 tablet (7.5 mg total) by mouth daily. 30 tablet 0   Multiple Vitamin (MULTIVITAMIN WITH MINERALS) TABS tablet Take 1 tablet by mouth daily. With Elderberry     Multiple Vitamins-Minerals (ZINC PO) Take 500 mg by mouth daily as needed (immune system boost).     nystatin (MYCOSTATIN/NYSTOP) powder Apply 1 Application topically 3 (three) times daily. 60 g 1   ondansetron (ZOFRAN) 4 MG tablet TAKE 1 TABLET BY MOUTH EVERY 8 HOURS AS NEEDED FOR NAUSEA FOR VOMITING 30 tablet 0   pantoprazole (PROTONIX) 40 MG tablet Take 1 tablet (40 mg total) by mouth daily. 90 tablet 3   predniSONE (DELTASONE) 10 MG tablet Take 40 mg by mouth on day 1, then taper 10 mg daily until gone 10 tablet 0   propranolol ER (INDERAL LA) 80 MG 24 hr capsule Take 1 capsule (80 mg total) by mouth daily. 90 capsule 1   rosuvastatin (CRESTOR) 5 MG tablet Take 1 tablet (5 mg total) by mouth at bedtime. 90 tablet 0   sertraline (ZOLOFT) 100 MG tablet Take 1 tablet (100 mg total) by mouth daily. 90 tablet 2   sertraline (ZOLOFT) 50 MG tablet Take 1 tablet (50 mg total) by mouth daily. 30 tablet 1   traZODone (DESYREL) 100 MG tablet Take 1 tablet (100 mg total) by mouth at bedtime. 90 tablet 1   Ubrogepant (UBRELVY) 100 MG TABS Take 1 tablet by mouth at onset of headache. May repeat dose x 1 after 2 hours if needed. Max 200 mg/ 24 hours. 30 tablet 1   Vitamin D, Ergocalciferol, (DRISDOL) 1.25 MG (50000 UNIT) CAPS capsule 1 po q Wed and 1 po q Sun 8 capsule 0   vitamin k 100 MCG tablet Take 100 mcg by mouth daily.     No current facility-administered medications on file prior to visit.    ALLERGIES: Allergies  Allergen Reactions   Benzalkonium Chloride Anaphylaxis and Hives   Neosporin [Neomycin-Bacitracin Zn-Polymyx] Anaphylaxis and Hives   Polysporin [Bacitracin-Polymyxin B] Hives   Bee Venom Hives and Swelling    Throat swelling   Bupropion Hcl Other (See  Comments)    angry   Cephalexin Hives and Swelling   Doxycycline Itching    05/2023   Latex     FAMILY HISTORY: Family History  Problem Relation Age of Onset   Diabetes Mother    High blood pressure Mother    High Cholesterol Mother    Depression Mother    Anxiety disorder Mother    Sleep apnea Mother  Obesity Mother    Heart attack Father 73   High blood pressure Father    High Cholesterol Father    Heart disease Father    Heart attack Brother    Breast cancer Maternal Aunt 50   Ovarian cancer Neg Hx       Objective:  *** General: No acute distress.  Patient appears well-groomed.   Head:  Normocephalic/atraumatic Eyes:  Fundi examined but not visualized Neck: supple, no paraspinal tenderness, full range of motion Heart:  Regular rate and rhythm Neurological Exam: alert and oriented.  Speech fluent and not dysarthric, language intact.  CN II-XII intact. Bulk and tone normal, muscle strength 5/5 throughout.  Sensation to light touch intact.  Deep tendon reflexes 2+ throughout.  Finger to nose testing intact.  Gait normal, Romberg negative.   Shon Millet, DO  CC: Kara Dies, NP

## 2023-09-04 ENCOUNTER — Ambulatory Visit: Admitting: Neurology

## 2023-09-04 ENCOUNTER — Encounter: Payer: Self-pay | Admitting: Neurology

## 2023-09-04 VITALS — BP 144/66 | HR 101 | Ht 67.0 in | Wt 378.8 lb

## 2023-09-04 DIAGNOSIS — G43009 Migraine without aura, not intractable, without status migrainosus: Secondary | ICD-10-CM | POA: Diagnosis not present

## 2023-09-04 NOTE — Patient Instructions (Signed)
 Start Qulipta 60mg  daily.  If no improvement in 3 months, contact me Instead of Ubrelvy, take Nurtec earliest onset of migraine (1 tablet in 24 hours).  Let me know if it works better than Ubrelvy Limit use of pain relievers to no more than 9 days out of the month to prevent risk of rebound or medication-overuse headache. Keep headache diary

## 2023-09-04 NOTE — Progress Notes (Signed)
 Medication Samples have been provided to the patient.  Drug name: Nurtec       Strength: 75 mg        Qty: 2  LOT: [id number]  Exp.Date: 11/27  Dosing instructions: as needed  The patient has been instructed regarding the correct time, dose, and frequency of taking this medication, including desired effects and most common side effects.   Leida Lauth 10:29 AM 09/04/2023

## 2023-09-08 ENCOUNTER — Ambulatory Visit: Payer: BC Managed Care – PPO | Admitting: Nurse Practitioner

## 2023-09-24 ENCOUNTER — Telehealth: Payer: Self-pay

## 2023-09-24 NOTE — Telephone Encounter (Signed)
 Tried to call patient just to check on patient due to comments made in appt. No voicemail it is full. Had front office move up appointment due to the nature of patients message.

## 2023-09-24 NOTE — Telephone Encounter (Signed)
 I called patient to see if she would be willing to speak with a triage nurse or come in for an appointment today with one of our providers.  I called patient's mobile number and her voice mailbox is full.  I called patient's home number and asked her to please call us .  E2C2 - if patient calls back, please offer to let her speak with a triage nurse or offer an appointment to be seen sooner than the appointment she made on 10/02/2023 for her depression and anxiety.

## 2023-09-24 NOTE — Telephone Encounter (Signed)
 I also sent a message to patient via MyChart.

## 2023-09-24 NOTE — Telephone Encounter (Signed)
 Patient scheduled an appointment with Tona Francis, NP, via MyChart for 10/02/2023 with the following comment:  "Patient comments: Really struggling with my depression and anxiety. It's causing me to miss work, turned my sleep schedule upside down, and I can't seem to make myself get up and function normally. I've got to get a handle on it and get back to working normally. Unsure if medication changes would be useful. It's also causing me to have more migraines because of the stress, I think."

## 2023-09-25 ENCOUNTER — Encounter: Payer: Self-pay | Admitting: Nurse Practitioner

## 2023-09-25 ENCOUNTER — Ambulatory Visit: Admitting: Nurse Practitioner

## 2023-09-25 VITALS — BP 116/76 | HR 81 | Temp 98.0°F | Ht 67.0 in | Wt 374.8 lb

## 2023-09-25 DIAGNOSIS — G4700 Insomnia, unspecified: Secondary | ICD-10-CM | POA: Diagnosis not present

## 2023-09-25 DIAGNOSIS — F419 Anxiety disorder, unspecified: Secondary | ICD-10-CM

## 2023-09-25 DIAGNOSIS — F32A Depression, unspecified: Secondary | ICD-10-CM

## 2023-09-25 MED ORDER — BUSPIRONE HCL 5 MG PO TABS: 5.0000 mg | ORAL_TABLET | Freq: Two times a day (BID) | ORAL | 2 refills | Status: AC

## 2023-09-25 MED ORDER — TRAZODONE HCL 150 MG PO TABS: 150.0000 mg | ORAL_TABLET | Freq: Every day | ORAL | 1 refills | Status: DC

## 2023-09-25 NOTE — Progress Notes (Signed)
 Established Patient Office Visit  Subjective:  Patient ID: Danielle Irwin, female    DOB: [birth year]  Age: 53 y.o. MRN: [id number]  CC:  Chief Complaint  Patient presents with   Acute Visit   Depression    Depression & Anxiety  Discussed the use of a AI scribe software for clinical note transcription with the patient, who gave verbal consent to proceed.   HPI  Danielle Irwin is a 53 year old female with a history of depression, anxiety, and migraines who presents with increased anxiety and depression related to work stress and social isolation.  Since transitioning to working from home, she has experienced increased anxiety and depression. 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 This has resulted in anxiety attacks that have significantly impacted her ability to work, leading to minimal work in March and no work in April.  She has difficulty sleeping, often not falling asleep until 4:30 AM or later, and only manages about four hours of sleep. Despite this, she feels constantly tired. She takes trazodone  100 mg at bedtime, which she finds ineffective, and supplements with melatonin and magnesium , which are also insufficient. For anxiety, she uses hydroxyzine  25 mg two to three times a day as needed, but not typically at night.  She has a history of depression and anxiety, for which she takes Zoloft  150 mg. She previously tried Wellbutrin but experienced a negative reaction, becoming very angry. No thoughts of self-harm or suicide, but she reports a lack of motivation and a desire to sleep all the time. Referral for for psychiatrist already in place.  HPI   Past Medical History:  Diagnosis Date   Anemia    Anxiety    Asthma    Back pain    Depression    Edema of both lower extremities    Epstein Barr infection    Family history of adverse  reaction to anesthesia    FATHER WAS SLOW TO WAKE UP   Fibromyalgia    GERD (gastroesophageal reflux disease)    H/O blood clots    Headache    OCCASIONAL MIGRAINES   History of fibromyalgia    HNP (herniated nucleus pulposus), lumbar    Hx of epistaxis    Hypertension    Insomnia    Joint pain    OA (osteoarthritis)    Obesity    PCOS (polycystic ovarian syndrome)    Sensitive skin    Sleep apnea    Vitamin D  deficiency     Past Surgical History:  Procedure Laterality Date   BREAST BIOPSY Left 09/17/2022   Stereo bx, Ribbon Clip, path pending   BREAST BIOPSY Left 09/17/2022   MM LT BREAST BX W LOC DEV 1ST LESION IMAGE BX SPEC STEREO GUIDE 09/17/2022 [facility]   COLONOSCOPY WITH PROPOFOL  N/A 09/09/2022   Procedure: COLONOSCOPY WITH PROPOFOL ;  Surgeon: Luke Salaam, MD;  Location: [facility] ENDOSCOPY;  Service: Gastroenterology;  Laterality: N/A;   LUMBAR LAMINECTOMY/DECOMPRESSION MICRODISCECTOMY Right 06/02/2014   Procedure: MICRO LUMBAR DECOMPRESSION L4 - L5 ON THE RIGHT 1 LEVEL;  Surgeon: Loel Ring, MD;  Location: [facility] ORS;  Service: Orthopedics;  Laterality: Right;   ORIF HUMERUS FRACTURE Left 08/01/2017   Procedure: OPEN REDUCTION INTERNAL FIXATION (ORIF) PROXIMAL HUMERUS FRACTURE;  Surgeon: Laneta Pintos, MD;  Location: [facility] OR;  Service: Orthopedics;  Laterality: Left;   WISDOM TOOTH EXTRACTION  Family History  Problem Relation Age of Onset   Diabetes Mother    High blood pressure Mother    High Cholesterol Mother    Depression Mother    Anxiety disorder Mother    Sleep apnea Mother    Obesity Mother    Heart attack Father 70   High blood pressure Father    High Cholesterol Father    Heart disease Father    Heart attack Brother    Breast cancer Maternal Aunt 50   Ovarian cancer Neg Hx     Social History   Socioeconomic History   Marital status: Single    Spouse name: Not on file   Number of children: Not on file   Years of education: Not on file    Highest education level: Some college, no degree  Occupational History   Occupation: debt collections  Tobacco Use   Smoking status: Former    Current packs/day: 0.00    Types: Cigarettes    Quit date: 05/26/1990    Years since quitting: 33.3   Smokeless tobacco: Never  Vaping Use   Vaping status: Never Used  Substance and Sexual Activity   Alcohol use: Yes    Comment: occ   Drug use: No   Sexual activity: Not Currently    Partners: Male    Birth control/protection: Post-menopausal  Other Topics Concern   Not on file  Social History Narrative   Not on file   Social Drivers of Health   Financial Resource Strain: Patient Declined (05/27/2023)   Overall Financial Resource Strain (CARDIA)    Difficulty of Paying Living Expenses: Patient declined  Recent Concern: Financial Resource Strain - Medium Risk (03/26/2023)   Overall Financial Resource Strain (CARDIA)    Difficulty of Paying Living Expenses: Somewhat hard  Food Insecurity: Patient Declined (05/27/2023)   Hunger Vital Sign    Worried About Running Out of Food in the Last Year: Patient declined    Ran Out of Food in the Last Year: Patient declined  Recent Concern: Food Insecurity - Food Insecurity Present (03/26/2023)   Hunger Vital Sign    Worried About Running Out of Food in the Last Year: Sometimes true    Ran Out of Food in the Last Year: Sometimes true  Transportation Needs: No Transportation Needs (05/27/2023)   PRAPARE - Administrator, Civil Service (Medical): No    Lack of Transportation (Non-Medical): No  Physical Activity: Unknown (05/27/2023)   Exercise Vital Sign    Days of Exercise per Week: 0 days    Minutes of Exercise per Session: Not on file  Stress: Stress Concern Present (05/27/2023)   [employer] of Occupational Health - Occupational Stress Questionnaire    Feeling of Stress : Rather much  Social Connections: Unknown (05/27/2023)   Social Connection and Isolation Panel  [NHANES]    Frequency of Communication with Friends and Family: Patient declined    Frequency of Social Gatherings with Friends and Family: Patient declined    Attends Religious Services: Patient declined    Database administrator or Organizations: No    Attends Engineer, structural: Not on file    Marital Status: Never married  Recent Concern: Social Connections - Moderately Isolated (03/26/2023)   Social Connection and Isolation Panel [NHANES]    Frequency of Communication with Friends and Family: Three times a week    Frequency of Social Gatherings with Friends and Family: Never    Attends Religious Services: 1  to 4 times per year    Active Member of Clubs or Organizations: No    Attends Banker Meetings: Not on file    Marital Status: Never married  Intimate Partner Violence: Not on file     Outpatient Medications Prior to Visit  Medication Sig Dispense Refill   acetaminophen  (TYLENOL ) 650 MG CR tablet Take 650 mg by mouth every 8 (eight) hours as needed for pain.     albuterol  (PROVENTIL ) (2.5 MG/3ML) 0.083% nebulizer solution Take 3 mLs (2.5 mg total) by nebulization every 6 (six) hours as needed for wheezing or shortness of breath. 150 mL 1   albuterol  (VENTOLIN  HFA) 108 (90 Base) MCG/ACT inhaler Inhale 2 puffs into the lungs every 6 (six) hours as needed for wheezing or shortness of breath. 8 g 2   Alum & Mag Hydroxide-Simeth (ANTACID ANTI-GAS PO) Take 1 tablet by mouth daily as needed (for gas).     Ascorbic Acid  (VITAMIN C ) 1000 MG tablet Take 1,000 mg by mouth 2 (two) times daily.     aspirin  EC 81 MG tablet Take 81 mg by mouth daily. Swallow whole.     aspirin -acetaminophen -caffeine (EXCEDRIN MIGRAINE) 250-250-65 MG tablet Take 2 tablets by mouth 2 (two) times daily as needed for headache or migraine.     b complex vitamins tablet Take 1 tablet by mouth daily.     bismuth  subsalicylate (PEPTO-BISMOL) 262 MG/15ML suspension Take 30 mLs by mouth 4 (four)  times daily -  before meals and at bedtime. 360 mL 0   budesonide -formoterol  (SYMBICORT ) 80-4.5 MCG/ACT inhaler Inhale 2 puffs into the lungs 2 (two) times daily. 1 each 3   Calcium  Carbonate-Vitamin D  (CALCIUM -D PO) Take 1,200 mg by mouth 2 (two) times daily.     cyclobenzaprine  (FLEXERIL ) 10 MG tablet Take 1 tablet by mouth three times daily as needed for muscle spasm 30 tablet 0   dicyclomine  (BENTYL ) 20 MG tablet Take 1 tablet (20 mg total) by mouth 3 (three) times daily as needed for spasms. 90 tablet 11   ECHINACEA-GOLDEN SEAL PO Take 1 tablet by mouth 2 (two) times daily as needed (immune system boost).     EPINEPHrine  0.3 mg/0.3 mL IJ SOAJ injection INJECT 0.3ML INTO THE MUSCLE ONCE AS DIRECTED 2 each 0   furosemide  (LASIX ) 20 MG tablet Take 1 tablet (20 mg total) by mouth daily. 30 tablet 3   guaiFENesin -codeine  100-10 MG/5ML syrup Take 5 mLs by mouth at bedtime as needed for cough. 60 mL 0   hydrochlorothiazide  (HYDRODIURIL ) 25 MG tablet Take 25 mg by mouth daily.     hydrOXYzine  (VISTARIL ) 25 MG capsule TAKE 1 CAPSULE BY MOUTH EVERY 8 HOURS AS NEEDED 90 capsule 0   lisinopril  (ZESTRIL ) 10 MG tablet Take 1 tablet by mouth once daily 90 tablet 1   Magnesium  250 MG TABS Take 500 mg by mouth at bedtime. Dosage varies     Melatonin 10 MG TABS Take 1 tablet by mouth at bedtime.     meloxicam  (MOBIC ) 7.5 MG tablet Take 1 tablet (7.5 mg total) by mouth daily. 30 tablet 0   Multiple Vitamin (MULTIVITAMIN WITH MINERALS) TABS tablet Take 1 tablet by mouth daily. With Elderberry     Multiple Vitamins-Minerals (ZINC  PO) Take 500 mg by mouth daily as needed (immune system boost).     nystatin  (MYCOSTATIN /NYSTOP ) powder Apply 1 Application topically 3 (three) times daily. 60 g 1   ondansetron  (ZOFRAN ) 4 MG tablet TAKE 1 TABLET BY  MOUTH EVERY 8 HOURS AS NEEDED FOR NAUSEA FOR VOMITING 30 tablet 0   pantoprazole  (PROTONIX ) 40 MG tablet Take 1 tablet (40 mg total) by mouth daily. 90 tablet 3    propranolol  ER (INDERAL  LA) 80 MG 24 hr capsule Take 1 capsule (80 mg total) by mouth daily. 90 capsule 1   rosuvastatin  (CRESTOR ) 5 MG tablet Take 1 tablet (5 mg total) by mouth at bedtime. 90 tablet 0   sertraline  (ZOLOFT ) 100 MG tablet Take 1 tablet (100 mg total) by mouth daily. 90 tablet 2   traZODone  (DESYREL ) 100 MG tablet Take 1 tablet (100 mg total) by mouth at bedtime. 90 tablet 1   Ubrogepant  (UBRELVY ) 100 MG TABS Take 1 tablet by mouth at onset of headache. May repeat dose x 1 after 2 hours if needed. Max 200 mg/ 24 hours. 30 tablet 1   Vitamin D , Ergocalciferol , (DRISDOL ) 1.25 MG (50000 UNIT) CAPS capsule 1 po q Wed and 1 po q Sun 8 capsule 0   vitamin k 100 MCG tablet Take 100 mcg by mouth daily.     sertraline  (ZOLOFT ) 50 MG tablet Take 1 tablet (50 mg total) by mouth daily. 30 tablet 1   No facility-administered medications prior to visit.    Allergies  Allergen Reactions   Benzalkonium Chloride Anaphylaxis and Hives   Neosporin [Neomycin-Bacitracin  Zn-Polymyx] Anaphylaxis and Hives   Polysporin [Bacitracin -Polymyxin B ] Hives   Bee Venom Hives and Swelling    Throat swelling   Bupropion Hcl Other (See Comments)    angry   Cephalexin Hives and Swelling   Doxycycline  Itching    05/2023   Latex     ROS Review of Systems Negative unless indicated in HPI.    Objective:    Physical Exam Constitutional:      Appearance: Normal appearance.  Cardiovascular:     Rate and Rhythm: Normal rate and regular rhythm.     Pulses: Normal pulses.     Heart sounds: Normal heart sounds.  Pulmonary:     Effort: Pulmonary effort is normal.     Breath sounds: Normal breath sounds. No wheezing.  Musculoskeletal:     Cervical back: Normal range of motion.  Neurological:     General: No focal deficit present.     Mental Status: She is alert. Mental status is at baseline.  Psychiatric:        Mood and Affect: Mood normal.        Behavior: Behavior normal.        Thought  Content: Thought content normal.        Judgment: Judgment normal.     BP 116/76   Pulse 81   Temp 98 F (36.7 C)   Ht 5\' 7"  (1.702 m)   Wt (!) 374 lb 12.8 oz (170 kg)   LMP 07/11/2016   SpO2 96%   BMI 58.70 kg/m  Wt Readings from Last 3 Encounters:  09/25/23 (!) 374 lb 12.8 oz (170 kg)  09/04/23 (!) 378 lb 12.8 oz (171.8 kg)  07/29/23 (!) 370 lb 12.8 oz (168.2 kg)     Health Maintenance  Topic Date Due   Hepatitis C Screening  Never done   Pneumococcal Vaccine 23-62 Years old (1 of 2 - PCV) Never done   Zoster Vaccines- Shingrix (1 of 2) Never done   COVID-19 Vaccine (1 - 2024-25 season) Never done   INFLUENZA VACCINE  01/09/2024   MAMMOGRAM  09/05/2024   DTaP/Tdap/Td (2 - Td  or Tdap) 06/25/2027   Cervical Cancer Screening (HPV/Pap Cotest)  09/02/2027   Colonoscopy  09/08/2032   HIV Screening  Completed   HPV VACCINES  Aged Out   Meningococcal B Vaccine  Aged Out    There are no preventive care reminders to display for this patient.  Lab Results  Component Value Date   TSH 2.85 03/28/2023   Lab Results  Component Value Date   WBC 7.7 04/23/2023   HGB 14.1 04/23/2023   HCT 42.4 04/23/2023   MCV 94.0 04/23/2023   PLT 259.0 04/23/2023   Lab Results  Component Value Date   NA 139 03/28/2023   K 4.0 03/28/2023   CO2 27 03/28/2023   GLUCOSE 137 (H) 03/28/2023   BUN 14 03/28/2023   CREATININE 0.81 03/28/2023   BILITOT 0.8 03/28/2023   ALKPHOS 40 03/28/2023   AST 27 03/28/2023   ALT 27 03/28/2023   PROT 7.4 03/28/2023   ALBUMIN  4.3 03/28/2023   CALCIUM  9.4 03/28/2023   ANIONGAP 10 08/02/2017   EGFR 86 02/11/2023   GFR 83.33 03/28/2023   Lab Results  Component Value Date   CHOL 245 (H) 02/11/2023   Lab Results  Component Value Date   HDL 50 02/11/2023   Lab Results  Component Value Date   LDLCALC 170 (H) 02/11/2023   Lab Results  Component Value Date   TRIG 138 02/11/2023   Lab Results  Component Value Date   CHOLHDL 5 07/18/2022    Lab Results  Component Value Date   HGBA1C 5.7 (H) 02/11/2023      Assessment & Plan:  Anxiety and depression Assessment & Plan: Increased anxiety and depression due to work changes and father's loss.  PHQ-9 score 23 and GAD score 20.  Zoloft  150 mg ineffective. Buspar  considered for anxiety management. - Start Buspar  5 mg twice daily with Zoloft . - Provided contact information of psychiatrist to schedule an appointment and offer new referral if needed   Insomnia, unspecified type Assessment & Plan: Significant sleep difficulty with ineffective trazodone  100 mg. Melatonin and magnesium  ineffective. Hydroxyzine  may aid sleep. - Increase trazodone  to 150 mg at bedtime. - Take hydroxyzine  25 mg at bedtime. - Consider using a white noise device to aid sleep.   Other orders -     busPIRone  HCl; Take 1 tablet (5 mg total) by mouth 2 (two) times daily.  Dispense: 180 tablet; Refill: 2 -     traZODone  HCl; Take 1 tablet (150 mg total) by mouth at bedtime.  Dispense: 90 tablet; Refill: 1    Follow-up: Return in about 1 month (around 10/25/2023).   Rosmary Dionisio, NP

## 2023-09-25 NOTE — Patient Instructions (Addendum)
 Increased trazodone from  100 to 150 mg. Added Buspar 5 mg twice a day. Please call Psychiatrist  [facility] [address], [place], [place] [address] Phone: [phone]

## 2023-09-29 ENCOUNTER — Other Ambulatory Visit: Payer: Self-pay | Admitting: Nurse Practitioner

## 2023-10-02 ENCOUNTER — Ambulatory Visit: Admitting: Nurse Practitioner

## 2023-10-07 NOTE — Progress Notes (Incomplete)
 Established Patient Office Visit  Subjective:  Patient ID: Danielle Irwin, female    DOB: 1971/03/16  Age: 53 y.o. MRN: [id number]  CC:  Chief Complaint  Patient presents with  . Acute Visit  . Depression    Depression & Anxiety  Discussed the use of a AI scribe software for clinical note transcription with the patient, who gave verbal consent to proceed.   HPI  Danielle Irwin is a 53 year old female with a history of depression, anxiety, and migraines who presents with increased anxiety and depression related to work stress and social isolation.  Since transitioning to working from home, she has experienced increased anxiety and depression. 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 This has resulted in anxiety attacks that have significantly impacted her ability to work, leading to minimal work in March and no work in April.  She has difficulty sleeping, often not falling asleep until 4:30 AM or later, and only manages about four hours of sleep. Despite this, she feels constantly tired. She takes trazodone  100 mg at bedtime, which she finds ineffective, and supplements with melatonin and magnesium , which are also insufficient. For anxiety, she uses hydroxyzine  25 mg two to three times a day as needed, but not typically at night.  She has a history of depression and anxiety, for which she takes Zoloft  150 mg. She previously tried Wellbutrin but experienced a negative reaction, becoming very angry. No thoughts of self-harm or suicide, but she reports a lack of motivation and a desire to sleep all the time.  HPI   Past Medical History:  Diagnosis Date  . Anemia   . Anxiety   . Asthma   . Back pain   . Depression   . Edema of both lower extremities   . Brigido Canales infection   . Family history of adverse reaction to anesthesia    FATHER WAS  SLOW TO WAKE UP  . Fibromyalgia   . GERD (gastroesophageal reflux disease)   . H/O blood clots   . Headache    OCCASIONAL MIGRAINES  . History of fibromyalgia   . HNP (herniated nucleus pulposus), lumbar   . Hx of epistaxis   . Hypertension   . Insomnia   . Joint pain   . OA (osteoarthritis)   . Obesity   . PCOS (polycystic ovarian syndrome)   . Sensitive skin   . Sleep apnea   . Vitamin D  deficiency     Past Surgical History:  Procedure Laterality Date  . BREAST BIOPSY Left 09/17/2022   Stereo bx, Ribbon Clip, path pending  . BREAST BIOPSY Left 09/17/2022   MM LT BREAST BX W LOC DEV 1ST LESION IMAGE BX SPEC STEREO GUIDE 09/17/2022 [facility]  . COLONOSCOPY WITH PROPOFOL  N/A 09/09/2022   Procedure: COLONOSCOPY WITH PROPOFOL ;  Surgeon: Luke Salaam, MD;  Location: [facility];  Service: Gastroenterology;  Laterality: N/A;  . LUMBAR LAMINECTOMY/DECOMPRESSION MICRODISCECTOMY Right 06/02/2014   Procedure: MICRO LUMBAR DECOMPRESSION L4 - L5 ON THE RIGHT 1 LEVEL;  Surgeon: Loel Ring, MD;  Location: [facility];  Service: Orthopedics;  Laterality: Right;  . ORIF HUMERUS FRACTURE Left 08/01/2017   Procedure: OPEN REDUCTION INTERNAL FIXATION (ORIF) PROXIMAL HUMERUS FRACTURE;  Surgeon: Laneta Pintos, MD;  Location: [facility];  Service: Orthopedics;  Laterality: Left;  . WISDOM TOOTH EXTRACTION      Family History  Problem  Relation Age of Onset  . Diabetes Mother   . High blood pressure Mother   . High Cholesterol Mother   . Depression Mother   . Anxiety disorder Mother   . Sleep apnea Mother   . Obesity Mother   . Heart attack Father 41  . High blood pressure Father   . High Cholesterol Father   . Heart disease Father   . Heart attack Brother   . Breast cancer Maternal Aunt 50  . Ovarian cancer Neg Hx     Social History   Socioeconomic History  . Marital status: Single    Spouse name: Not on file  . Number of children: Not on file  . Years of education: Not on file   . Highest education level: Some college, no degree  Occupational History  . Occupation: debt collections  Tobacco Use  . Smoking status: Former    Current packs/day: 0.00    Types: Cigarettes    Quit date: 05/26/1990    Years since quitting: 33.3  . Smokeless tobacco: Never  Vaping Use  . Vaping status: Never Used  Substance and Sexual Activity  . Alcohol use: Yes    Comment: occ  . Drug use: No  . Sexual activity: Not Currently    Partners: Male    Birth control/protection: Post-menopausal  Other Topics Concern  . Not on file  Social History Narrative  . Not on file   Social Drivers of Health   Financial Resource Strain: Patient Declined (05/27/2023)   Overall Financial Resource Strain (CARDIA)   . Difficulty of Paying Living Expenses: Patient declined  Recent Concern: Financial Resource Strain - Medium Risk (03/26/2023)   Overall Financial Resource Strain (CARDIA)   . Difficulty of Paying Living Expenses: Somewhat hard  Food Insecurity: Patient Declined (05/27/2023)   Hunger Vital Sign   . Worried About Programme researcher, broadcasting/film/video in the Last Year: Patient declined   . Ran Out of Food in the Last Year: Patient declined  Recent Concern: Food Insecurity - Food Insecurity Present (03/26/2023)   Hunger Vital Sign   . Worried About Programme researcher, broadcasting/film/video in the Last Year: Sometimes true   . Ran Out of Food in the Last Year: Sometimes true  Transportation Needs: No Transportation Needs (05/27/2023)   PRAPARE - Transportation   . Lack of Transportation (Medical): No   . Lack of Transportation (Non-Medical): No  Physical Activity: Unknown (05/27/2023)   Exercise Vital Sign   . Days of Exercise per Week: 0 days   . Minutes of Exercise per Session: Not on file  Stress: Stress Concern Present (05/27/2023)   [employer] of Occupational Health - Occupational Stress Questionnaire   . Feeling of Stress : Rather much  Social Connections: Unknown (05/27/2023)   Social Connection and  Isolation Panel [NHANES]   . Frequency of Communication with Friends and Family: Patient declined   . Frequency of Social Gatherings with Friends and Family: Patient declined   . Attends Religious Services: Patient declined   . Active Member of Clubs or Organizations: No   . Attends Banker Meetings: Not on file   . Marital Status: Never married  Recent Concern: Social Connections - Moderately Isolated (03/26/2023)   Social Connection and Isolation Panel [NHANES]   . Frequency of Communication with Friends and Family: Three times a week   . Frequency of Social Gatherings with Friends and Family: Never   . Attends Religious Services: 1 to 4 times per  year   . Active Member of Clubs or Organizations: No   . Attends Banker Meetings: Not on file   . Marital Status: Never married  Intimate Partner Violence: Not on file     Outpatient Medications Prior to Visit  Medication Sig Dispense Refill  . acetaminophen  (TYLENOL ) 650 MG CR tablet Take 650 mg by mouth every 8 (eight) hours as needed for pain.    . albuterol  (PROVENTIL ) (2.5 MG/3ML) 0.083% nebulizer solution Take 3 mLs (2.5 mg total) by nebulization every 6 (six) hours as needed for wheezing or shortness of breath. 150 mL 1  . albuterol  (VENTOLIN  HFA) 108 (90 Base) MCG/ACT inhaler Inhale 2 puffs into the lungs every 6 (six) hours as needed for wheezing or shortness of breath. 8 g 2  . Alum & Mag Hydroxide-Simeth (ANTACID ANTI-GAS PO) Take 1 tablet by mouth daily as needed (for gas).    . Ascorbic Acid  (VITAMIN C ) 1000 MG tablet Take 1,000 mg by mouth 2 (two) times daily.    . aspirin  EC 81 MG tablet Take 81 mg by mouth daily. Swallow whole.    . aspirin -acetaminophen -caffeine (EXCEDRIN MIGRAINE) 250-250-65 MG tablet Take 2 tablets by mouth 2 (two) times daily as needed for headache or migraine.    Aaron Aas b complex vitamins tablet Take 1 tablet by mouth daily.    . bismuth  subsalicylate (PEPTO-BISMOL) 262 MG/15ML  suspension Take 30 mLs by mouth 4 (four) times daily -  before meals and at bedtime. 360 mL 0  . budesonide -formoterol  (SYMBICORT ) 80-4.5 MCG/ACT inhaler Inhale 2 puffs into the lungs 2 (two) times daily. 1 each 3  . Calcium  Carbonate-Vitamin D  (CALCIUM -D PO) Take 1,200 mg by mouth 2 (two) times daily.    . cyclobenzaprine  (FLEXERIL ) 10 MG tablet Take 1 tablet by mouth three times daily as needed for muscle spasm 30 tablet 0  . dicyclomine  (BENTYL ) 20 MG tablet Take 1 tablet (20 mg total) by mouth 3 (three) times daily as needed for spasms. 90 tablet 11  . ECHINACEA-GOLDEN SEAL PO Take 1 tablet by mouth 2 (two) times daily as needed (immune system boost).    . EPINEPHrine  0.3 mg/0.3 mL IJ SOAJ injection INJECT 0.3ML INTO THE MUSCLE ONCE AS DIRECTED 2 each 0  . furosemide  (LASIX ) 20 MG tablet Take 1 tablet (20 mg total) by mouth daily. 30 tablet 3  . guaiFENesin -codeine  100-10 MG/5ML syrup Take 5 mLs by mouth at bedtime as needed for cough. 60 mL 0  . hydrochlorothiazide  (HYDRODIURIL ) 25 MG tablet Take 25 mg by mouth daily.    . hydrOXYzine  (VISTARIL ) 25 MG capsule TAKE 1 CAPSULE BY MOUTH EVERY 8 HOURS AS NEEDED 90 capsule 0  . lisinopril  (ZESTRIL ) 10 MG tablet Take 1 tablet by mouth once daily 90 tablet 1  . Magnesium  250 MG TABS Take 500 mg by mouth at bedtime. Dosage varies    . Melatonin 10 MG TABS Take 1 tablet by mouth at bedtime.    . meloxicam  (MOBIC ) 7.5 MG tablet Take 1 tablet (7.5 mg total) by mouth daily. 30 tablet 0  . Multiple Vitamin (MULTIVITAMIN WITH MINERALS) TABS tablet Take 1 tablet by mouth daily. With [employer]    . Multiple Vitamins-Minerals (ZINC  PO) Take 500 mg by mouth daily as needed (immune system boost).    . nystatin  (MYCOSTATIN /NYSTOP ) powder Apply 1 Application topically 3 (three) times daily. 60 g 1  . ondansetron  (ZOFRAN ) 4 MG tablet TAKE 1 TABLET BY MOUTH EVERY 8 HOURS  AS NEEDED FOR NAUSEA FOR VOMITING 30 tablet 0  . pantoprazole  (PROTONIX ) 40 MG tablet Take 1  tablet (40 mg total) by mouth daily. 90 tablet 3  . propranolol  ER (INDERAL  LA) 80 MG 24 hr capsule Take 1 capsule (80 mg total) by mouth daily. 90 capsule 1  . rosuvastatin  (CRESTOR ) 5 MG tablet Take 1 tablet (5 mg total) by mouth at bedtime. 90 tablet 0  . sertraline  (ZOLOFT ) 100 MG tablet Take 1 tablet (100 mg total) by mouth daily. 90 tablet 2  . traZODone  (DESYREL ) 100 MG tablet Take 1 tablet (100 mg total) by mouth at bedtime. 90 tablet 1  . Ubrogepant  (UBRELVY ) 100 MG TABS Take 1 tablet by mouth at onset of headache. May repeat dose x 1 after 2 hours if needed. Max 200 mg/ 24 hours. 30 tablet 1  . Vitamin D , Ergocalciferol , (DRISDOL ) 1.25 MG (50000 UNIT) CAPS capsule 1 po q Wed and 1 po q Sun 8 capsule 0  . vitamin k 100 MCG tablet Take 100 mcg by mouth daily.    . sertraline  (ZOLOFT ) 50 MG tablet Take 1 tablet (50 mg total) by mouth daily. 30 tablet 1   No facility-administered medications prior to visit.    Allergies  Allergen Reactions  . Benzalkonium Chloride Anaphylaxis and Hives  . Neosporin [Neomycin-Bacitracin  Zn-Polymyx] Anaphylaxis and Hives  . Polysporin [Bacitracin -Polymyxin B ] Hives  . Bee Venom Hives and Swelling    Throat swelling  . Bupropion Hcl Other (See Comments)    angry  . Cephalexin Hives and Swelling  . Doxycycline  Itching    05/2023  . Latex     ROS Review of Systems Negative unless indicated in HPI.    Objective:    Physical Exam  BP 116/76   Pulse 81   Temp 98 F (36.7 C)   Ht 5\' 7"  (1.702 m)   Wt (!) 374 lb 12.8 oz (170 kg)   LMP 07/11/2016   SpO2 96%   BMI 58.70 kg/m  Wt Readings from Last 3 Encounters:  09/25/23 (!) 374 lb 12.8 oz (170 kg)  09/04/23 (!) 378 lb 12.8 oz (171.8 kg)  07/29/23 (!) 370 lb 12.8 oz (168.2 kg)     Health Maintenance  Topic Date Due  . Hepatitis C Screening  Never done  . Pneumococcal Vaccine 21-40 Years old (1 of 2 - PCV) Never done  . Zoster Vaccines- Shingrix (1 of 2) Never done  . COVID-19  Vaccine (1 - 2024-25 season) Never done  . INFLUENZA VACCINE  01/09/2024  . MAMMOGRAM  09/05/2024  . DTaP/Tdap/Td (2 - Td or Tdap) 06/25/2027  . Cervical Cancer Screening (HPV/Pap Cotest)  09/02/2027  . Colonoscopy  09/08/2032  . HIV Screening  Completed  . HPV VACCINES  Aged Out  . Meningococcal B Vaccine  Aged Out    There are no preventive care reminders to display for this patient.  Lab Results  Component Value Date   TSH 2.85 03/28/2023   Lab Results  Component Value Date   WBC 7.7 04/23/2023   HGB 14.1 04/23/2023   HCT 42.4 04/23/2023   MCV 94.0 04/23/2023   PLT 259.0 04/23/2023   Lab Results  Component Value Date   NA 139 03/28/2023   K 4.0 03/28/2023   CO2 27 03/28/2023   GLUCOSE 137 (H) 03/28/2023   BUN 14 03/28/2023   CREATININE 0.81 03/28/2023   BILITOT 0.8 03/28/2023   ALKPHOS 40 03/28/2023   AST 27 03/28/2023  ALT 27 03/28/2023   PROT 7.4 03/28/2023   ALBUMIN  4.3 03/28/2023   CALCIUM  9.4 03/28/2023   ANIONGAP 10 08/02/2017   EGFR 86 02/11/2023   GFR 83.33 03/28/2023   Lab Results  Component Value Date   CHOL 245 (H) 02/11/2023   Lab Results  Component Value Date   HDL 50 02/11/2023   Lab Results  Component Value Date   LDLCALC 170 (H) 02/11/2023   Lab Results  Component Value Date   TRIG 138 02/11/2023   Lab Results  Component Value Date   CHOLHDL 5 07/18/2022   Lab Results  Component Value Date   HGBA1C 5.7 (H) 02/11/2023      Assessment & Plan:  Anxiety and depression  Insomnia, unspecified type  Other orders -     busPIRone  HCl; Take 1 tablet (5 mg total) by mouth 2 (two) times daily.  Dispense: 180 tablet; Refill: 2 -     traZODone  HCl; Take 1 tablet (150 mg total) by mouth at bedtime.  Dispense: 90 tablet; Refill: 1    Follow-up: Return in about 1 month (around 10/25/2023).   Kailyn Dubie, NP

## 2023-10-07 NOTE — Assessment & Plan Note (Signed)
 Significant sleep difficulty with ineffective trazodone  100 mg. Melatonin and magnesium  ineffective. Hydroxyzine  may aid sleep. - Increase trazodone  to 150 mg at bedtime. - Take hydroxyzine  25 mg at bedtime. - Consider using a white noise device to aid sleep.

## 2023-10-07 NOTE — Assessment & Plan Note (Addendum)
 Increased anxiety and depression due to work changes and father's loss.  PHQ-9 score 23 and GAD score 20.  Zoloft  150 mg ineffective. Buspar  considered for anxiety management. - Start Buspar  5 mg twice daily with Zoloft . - Provided contact information of psychiatrist to schedule an appointment and offer new referral if needed

## 2023-10-10 ENCOUNTER — Encounter: Payer: Self-pay | Admitting: Nurse Practitioner

## 2023-10-10 ENCOUNTER — Ambulatory Visit: Admitting: Nurse Practitioner

## 2023-10-10 VITALS — BP 124/84 | HR 72 | Temp 98.0°F | Ht 67.0 in | Wt 380.0 lb

## 2023-10-10 DIAGNOSIS — J069 Acute upper respiratory infection, unspecified: Secondary | ICD-10-CM | POA: Diagnosis not present

## 2023-10-10 MED ORDER — PREDNISONE 20 MG PO TABS: 40.0000 mg | ORAL_TABLET | Freq: Every day | ORAL | 0 refills | Status: AC

## 2023-10-10 MED ORDER — AMOXICILLIN-POT CLAVULANATE 875-125 MG PO TABS: 1.0000 | ORAL_TABLET | Freq: Two times a day (BID) | ORAL | 0 refills | Status: DC

## 2023-10-10 NOTE — Progress Notes (Signed)
 Established Patient Office Visit  Subjective:  Patient ID: Danielle Irwin, female    DOB: 1971/04/16  Age: 53 y.o. MRN: [id number]  CC:  Chief Complaint  Patient presents with   Sore Throat    Sore throat x 1 week  Pain with swallowing, Earaches & coughing  No fever   Discussed the use of a AI scribe software for clinical note transcription with the patient, who gave verbal consent to proceed.  HPI  Danielle Irwin is a 53 year old female who presents with a sore throat and sinus congestion.  She has had a sore throat for one week, described as very scratchy and sore, with pain extending to her tongue and mouth. She has difficulty swallowing, especially with food. Sinus congestion is present, with a sensation of her head being clogged. Mucinex  has not provided relief. She experiences alternating earaches and has developed a dry cough. Headaches are present in the sinus area, particularly across her cheeks, more on the left side. No fever has been recorded, although she has felt hot at times. Her symptoms began after spending significant time at the hospital last week, where her mother was admitted for COPD.  HPI   Past Medical History:  Diagnosis Date   Anemia    Anxiety    Asthma    Back pain    Depression    Edema of both lower extremities    Epstein Barr infection    Family history of adverse reaction to anesthesia    FATHER WAS SLOW TO WAKE UP   Fibromyalgia    GERD (gastroesophageal reflux disease)    H/O blood clots    Headache    OCCASIONAL MIGRAINES   History of fibromyalgia    HNP (herniated nucleus pulposus), lumbar    Hx of epistaxis    Hypertension    Insomnia    Joint pain    OA (osteoarthritis)    Obesity    PCOS (polycystic ovarian syndrome)    Sensitive skin    Sleep apnea    Vitamin D  deficiency     Past Surgical History:  Procedure Laterality Date   BREAST BIOPSY Left 09/17/2022   Stereo bx, Ribbon Clip, path pending   BREAST BIOPSY Left  09/17/2022   MM LT BREAST BX W LOC DEV 1ST LESION IMAGE BX SPEC STEREO GUIDE 09/17/2022 [facility]   COLONOSCOPY WITH PROPOFOL  N/A 09/09/2022   Procedure: COLONOSCOPY WITH PROPOFOL ;  Surgeon: Luke Salaam, MD;  Location: [facility] ENDOSCOPY;  Service: Gastroenterology;  Laterality: N/A;   LUMBAR LAMINECTOMY/DECOMPRESSION MICRODISCECTOMY Right 06/02/2014   Procedure: MICRO LUMBAR DECOMPRESSION L4 - L5 ON THE RIGHT 1 LEVEL;  Surgeon: Loel Ring, MD;  Location: [facility] ORS;  Service: Orthopedics;  Laterality: Right;   ORIF HUMERUS FRACTURE Left 08/01/2017   Procedure: OPEN REDUCTION INTERNAL FIXATION (ORIF) PROXIMAL HUMERUS FRACTURE;  Surgeon: Laneta Pintos, MD;  Location: [facility] OR;  Service: Orthopedics;  Laterality: Left;   WISDOM TOOTH EXTRACTION      Family History  Problem Relation Age of Onset   Diabetes Mother    High blood pressure Mother    High Cholesterol Mother    Depression Mother    Anxiety disorder Mother    Sleep apnea Mother    Obesity Mother    Heart attack Father 57   High blood pressure Father    High Cholesterol Father    Heart disease Father    Heart attack Brother    Breast cancer Maternal  Aunt 50   Ovarian cancer Neg Hx     Social History   Socioeconomic History   Marital status: Single    Spouse name: Not on file   Number of children: Not on file   Years of education: Not on file   Highest education level: Some college, no degree  Occupational History   Occupation: debt collections  Tobacco Use   Smoking status: Former    Current packs/day: 0.00    Types: Cigarettes    Quit date: 05/26/1990    Years since quitting: 33.4   Smokeless tobacco: Never  Vaping Use   Vaping status: Never Used  Substance and Sexual Activity   Alcohol use: Yes    Comment: occ   Drug use: No   Sexual activity: Not Currently    Partners: Male    Birth control/protection: Post-menopausal  Other Topics Concern   Not on file  Social History Narrative   Not on file    Social Drivers of Health   Financial Resource Strain: Patient Declined (05/27/2023)   Overall Financial Resource Strain (CARDIA)    Difficulty of Paying Living Expenses: Patient declined  Recent Concern: Financial Resource Strain - Medium Risk (03/26/2023)   Overall Financial Resource Strain (CARDIA)    Difficulty of Paying Living Expenses: Somewhat hard  Food Insecurity: Patient Declined (05/27/2023)   Hunger Vital Sign    Worried About Running Out of Food in the Last Year: Patient declined    Ran Out of Food in the Last Year: Patient declined  Recent Concern: Food Insecurity - Food Insecurity Present (03/26/2023)   Hunger Vital Sign    Worried About Running Out of Food in the Last Year: Sometimes true    Ran Out of Food in the Last Year: Sometimes true  Transportation Needs: No Transportation Needs (05/27/2023)   PRAPARE - Administrator, Civil Service (Medical): No    Lack of Transportation (Non-Medical): No  Physical Activity: Unknown (05/27/2023)   Exercise Vital Sign    Days of Exercise per Week: 0 days    Minutes of Exercise per Session: Not on file  Stress: Stress Concern Present (05/27/2023)   [employer] of Occupational Health - Occupational Stress Questionnaire    Feeling of Stress : Rather much  Social Connections: Unknown (05/27/2023)   Social Connection and Isolation Panel [NHANES]    Frequency of Communication with Friends and Family: Patient declined    Frequency of Social Gatherings with Friends and Family: Patient declined    Attends Religious Services: Patient declined    Database administrator or Organizations: No    Attends Engineer, structural: Not on file    Marital Status: Never married  Recent Concern: Social Connections - Moderately Isolated (03/26/2023)   Social Connection and Isolation Panel [NHANES]    Frequency of Communication with Friends and Family: Three times a week    Frequency of Social Gatherings with Friends  and Family: Never    Attends Religious Services: 1 to 4 times per year    Active Member of [employer] or Organizations: No    Attends Engineer, structural: Not on file    Marital Status: Never married  Intimate Partner Violence: Not on file     Outpatient Medications Prior to Visit  Medication Sig Dispense Refill   acetaminophen  (TYLENOL ) 650 MG CR tablet Take 650 mg by mouth every 8 (eight) hours as needed for pain.     albuterol  (PROVENTIL ) (2.5 MG/3ML) 0.083%  nebulizer solution Take 3 mLs (2.5 mg total) by nebulization every 6 (six) hours as needed for wheezing or shortness of breath. 150 mL 1   albuterol  (VENTOLIN  HFA) 108 (90 Base) MCG/ACT inhaler Inhale 2 puffs into the lungs every 6 (six) hours as needed for wheezing or shortness of breath. 8 g 2   Alum & Mag Hydroxide-Simeth (ANTACID ANTI-GAS PO) Take 1 tablet by mouth daily as needed (for gas).     Ascorbic Acid  (VITAMIN C ) 1000 MG tablet Take 1,000 mg by mouth 2 (two) times daily.     aspirin  EC 81 MG tablet Take 81 mg by mouth daily. Swallow whole.     aspirin -acetaminophen -caffeine (EXCEDRIN MIGRAINE) 250-250-65 MG tablet Take 2 tablets by mouth 2 (two) times daily as needed for headache or migraine.     b complex vitamins tablet Take 1 tablet by mouth daily.     bismuth  subsalicylate (PEPTO-BISMOL) 262 MG/15ML suspension Take 30 mLs by mouth 4 (four) times daily -  before meals and at bedtime. 360 mL 0   budesonide -formoterol  (SYMBICORT ) 80-4.5 MCG/ACT inhaler Inhale 2 puffs into the lungs 2 (two) times daily. 1 each 3   busPIRone  (BUSPAR ) 5 MG tablet Take 1 tablet (5 mg total) by mouth 2 (two) times daily. 180 tablet 2   Calcium  Carbonate-Vitamin D  (CALCIUM -D PO) Take 1,200 mg by mouth 2 (two) times daily.     dicyclomine  (BENTYL ) 20 MG tablet Take 1 tablet (20 mg total) by mouth 3 (three) times daily as needed for spasms. 90 tablet 11   ECHINACEA-GOLDEN SEAL PO Take 1 tablet by mouth 2 (two) times daily as needed (immune  system boost).     EPINEPHrine  0.3 mg/0.3 mL IJ SOAJ injection INJECT 0.3ML INTO THE MUSCLE ONCE AS DIRECTED 2 each 0   furosemide  (LASIX ) 20 MG tablet Take 1 tablet (20 mg total) by mouth daily. 30 tablet 3   guaiFENesin -codeine  100-10 MG/5ML syrup Take 5 mLs by mouth at bedtime as needed for cough. 60 mL 0   hydrochlorothiazide  (HYDRODIURIL ) 25 MG tablet Take 25 mg by mouth daily.     lisinopril  (ZESTRIL ) 10 MG tablet Take 1 tablet by mouth once daily 90 tablet 1   Magnesium  250 MG TABS Take 500 mg by mouth at bedtime. Dosage varies     Melatonin 10 MG TABS Take 1 tablet by mouth at bedtime.     meloxicam  (MOBIC ) 7.5 MG tablet Take 1 tablet (7.5 mg total) by mouth daily. 30 tablet 0   Multiple Vitamin (MULTIVITAMIN WITH MINERALS) TABS tablet Take 1 tablet by mouth daily. With Elderberry     Multiple Vitamins-Minerals (ZINC  PO) Take 500 mg by mouth daily as needed (immune system boost).     nystatin  (MYCOSTATIN /NYSTOP ) powder Apply 1 Application topically 3 (three) times daily. 60 g 1   ondansetron  (ZOFRAN ) 4 MG tablet TAKE 1 TABLET BY MOUTH EVERY 8 HOURS AS NEEDED FOR NAUSEA FOR VOMITING 30 tablet 0   pantoprazole  (PROTONIX ) 40 MG tablet Take 1 tablet (40 mg total) by mouth daily. 90 tablet 3   propranolol  ER (INDERAL  LA) 80 MG 24 hr capsule Take 1 capsule (80 mg total) by mouth daily. 90 capsule 1   rosuvastatin  (CRESTOR ) 5 MG tablet Take 1 tablet (5 mg total) by mouth at bedtime. 90 tablet 0   sertraline  (ZOLOFT ) 100 MG tablet Take 1 tablet (100 mg total) by mouth daily. 90 tablet 2   sertraline  (ZOLOFT ) 50 MG tablet Take 1 tablet by  mouth once daily 30 tablet 0   traZODone  (DESYREL ) 100 MG tablet Take 1 tablet (100 mg total) by mouth at bedtime. 90 tablet 1   traZODone  (DESYREL ) 150 MG tablet Take 1 tablet (150 mg total) by mouth at bedtime. 90 tablet 1   Ubrogepant  (UBRELVY ) 100 MG TABS Take 1 tablet by mouth at onset of headache. May repeat dose x 1 after 2 hours if needed. Max 200 mg/ 24  hours. 30 tablet 1   Vitamin D , Ergocalciferol , (DRISDOL ) 1.25 MG (50000 UNIT) CAPS capsule 1 po q Wed and 1 po q Sun 8 capsule 0   vitamin k 100 MCG tablet Take 100 mcg by mouth daily.     cyclobenzaprine  (FLEXERIL ) 10 MG tablet Take 1 tablet by mouth three times daily as needed for muscle spasm 30 tablet 0   hydrOXYzine  (VISTARIL ) 25 MG capsule TAKE 1 CAPSULE BY MOUTH EVERY 8 HOURS AS NEEDED 90 capsule 0   No facility-administered medications prior to visit.    Allergies  Allergen Reactions   Benzalkonium Chloride Anaphylaxis and Hives   Neosporin [Neomycin-Bacitracin  Zn-Polymyx] Anaphylaxis and Hives   Polysporin [Bacitracin -Polymyxin B ] Hives   Bee Venom Hives and Swelling    Throat swelling   Bupropion Hcl Other (See Comments)    angry   Cephalexin Hives and Swelling   Doxycycline  Itching    05/2023   Latex     ROS Review of Systems Negative unless indicated in HPI.    Objective:    Physical Exam Constitutional:      Appearance: She is well-developed.  HENT:     Head: Normocephalic.     Right Ear: Tympanic membrane normal.     Left Ear: Tympanic membrane normal.     Mouth/Throat:     Mouth: Mucous membranes are moist. No oral lesions.     Pharynx: No oropharyngeal exudate or posterior oropharyngeal erythema.  Cardiovascular:     Rate and Rhythm: Normal rate and regular rhythm.  Pulmonary:     Effort: Pulmonary effort is normal.     Breath sounds: Normal breath sounds.  Neurological:     General: No focal deficit present.     Mental Status: She is alert and oriented to person, place, and time.  Psychiatric:        Mood and Affect: Mood normal.     BP 124/84   Pulse 72   Temp 98 F (36.7 C)   Ht 5\' 7"  (1.702 m)   Wt (!) 380 lb (172.4 kg)   LMP 07/11/2016   SpO2 96%   BMI 59.52 kg/m  Wt Readings from Last 3 Encounters:  10/10/23 (!) 380 lb (172.4 kg)  09/25/23 (!) 374 lb 12.8 oz (170 kg)  09/04/23 (!) 378 lb 12.8 oz (171.8 kg)     Health  Maintenance  Topic Date Due   Hepatitis C Screening  Never done   Pneumococcal Vaccine 17-84 Years old (1 of 2 - PCV) Never done   Zoster Vaccines- Shingrix (1 of 2) Never done   COVID-19 Vaccine (1 - 2024-25 season) Never done   INFLUENZA VACCINE  01/09/2024   MAMMOGRAM  09/05/2024   DTaP/Tdap/Td (2 - Td or Tdap) 06/25/2027   Cervical Cancer Screening (HPV/Pap Cotest)  09/02/2027   Colonoscopy  09/08/2032   HIV Screening  Completed   HPV VACCINES  Aged Out   Meningococcal B Vaccine  Aged Out    There are no preventive care reminders to display for this patient.  Lab Results  Component Value Date   TSH 2.85 03/28/2023   Lab Results  Component Value Date   WBC 7.7 04/23/2023   HGB 14.1 04/23/2023   HCT 42.4 04/23/2023   MCV 94.0 04/23/2023   PLT 259.0 04/23/2023   Lab Results  Component Value Date   NA 139 03/28/2023   K 4.0 03/28/2023   CO2 27 03/28/2023   GLUCOSE 137 (H) 03/28/2023   BUN 14 03/28/2023   CREATININE 0.81 03/28/2023   BILITOT 0.8 03/28/2023   ALKPHOS 40 03/28/2023   AST 27 03/28/2023   ALT 27 03/28/2023   PROT 7.4 03/28/2023   ALBUMIN  4.3 03/28/2023   CALCIUM  9.4 03/28/2023   ANIONGAP 10 08/02/2017   EGFR 86 02/11/2023   GFR 83.33 03/28/2023   Lab Results  Component Value Date   CHOL 245 (H) 02/11/2023   Lab Results  Component Value Date   HDL 50 02/11/2023   Lab Results  Component Value Date   LDLCALC 170 (H) 02/11/2023   Lab Results  Component Value Date   TRIG 138 02/11/2023   Lab Results  Component Value Date   CHOLHDL 5 07/18/2022   Lab Results  Component Value Date   HGBA1C 5.7 (H) 02/11/2023      Assessment & Plan:  Upper respiratory tract infection, unspecified type Assessment & Plan: Pt is afebrile, without respiratoty distress.  Will treat with Augmentin  and prednisone  Advised to continue take plain Mucinex  and take OTC antihistamine for PND. Incresae fluid intake and rest. Recommend salt water gargles. Advise  increased fluid intake and soft foods.   Other orders -     Amoxicillin -Pot Clavulanate; Take 1 tablet by mouth 2 (two) times daily.  Dispense: 20 tablet; Refill: 0 -     predniSONE ; Take 2 tablets (40 mg total) by mouth daily with breakfast for 5 days.  Dispense: 10 tablet; Refill: 0    Follow-up: No follow-ups on file.   Antonya Leeder, NP

## 2023-10-17 ENCOUNTER — Encounter (HOSPITAL_COMMUNITY): Payer: Self-pay

## 2023-10-18 ENCOUNTER — Other Ambulatory Visit: Payer: Self-pay | Admitting: Nurse Practitioner

## 2023-10-24 ENCOUNTER — Ambulatory Visit: Admitting: Nurse Practitioner

## 2023-10-27 NOTE — Assessment & Plan Note (Signed)
 Pt is afebrile, without respiratoty distress.  Will treat with Augmentin  and prednisone  Advised to continue take plain Mucinex  and take OTC antihistamine for PND. Incresae fluid intake and rest. Recommend salt water gargles. Advise increased fluid intake and soft foods.

## 2023-10-29 ENCOUNTER — Other Ambulatory Visit: Payer: Self-pay | Admitting: Nurse Practitioner

## 2023-12-22 ENCOUNTER — Telehealth: Payer: Self-pay

## 2023-12-22 NOTE — Telephone Encounter (Signed)
 Copied from CRM [id number]. Topic: General - Other >> Dec 22, 2023  2:18 PM Abigail D wrote: Reason for CRM: Patient has a visit scheduled to get some paperwork originally filled out by Leron Glance changed from intermittent leave to a continuous leave from work due to migraines. There was nothing available until 8/26 but she wanted to put it down in the books just to be safe. She is wondering if this paperwork requires a visit or if it's something that she could request Kacy to change/or if NP Chelsea Aurora could do it instead.

## 2023-12-24 NOTE — Telephone Encounter (Signed)
 Call pt to check how is her migraine headache now. She is followed by neurology, when is her next appointment with them.

## 2023-12-25 NOTE — Telephone Encounter (Signed)
 Still having headaches, stress and anxiety daily. No change. She is scheduled to see neurology 10/03. Advised her to call them and see if sooner appt can be scheduled. She sees you 7/31.

## 2023-12-29 ENCOUNTER — Ambulatory Visit: Admitting: Neurology

## 2024-01-01 ENCOUNTER — Ambulatory Visit: Admitting: Nurse Practitioner

## 2024-01-06 ENCOUNTER — Encounter: Payer: Self-pay | Admitting: Nurse Practitioner

## 2024-01-06 ENCOUNTER — Ambulatory Visit: Admitting: Nurse Practitioner

## 2024-01-06 VITALS — BP 132/82 | HR 89 | Temp 98.5°F | Ht 67.0 in | Wt 376.4 lb

## 2024-01-06 DIAGNOSIS — G43001 Migraine without aura, not intractable, with status migrainosus: Secondary | ICD-10-CM | POA: Diagnosis not present

## 2024-01-06 MED ORDER — CYCLOBENZAPRINE HCL 10 MG PO TABS: 10.0000 mg | ORAL_TABLET | Freq: Three times a day (TID) | ORAL | 0 refills | Status: DC | PRN

## 2024-01-06 MED ORDER — EPINEPHRINE 0.3 MG/0.3ML IJ SOAJ: INTRAMUSCULAR | 0 refills | Status: AC

## 2024-01-06 MED ORDER — ONDANSETRON HCL 4 MG PO TABS: 4.0000 mg | ORAL_TABLET | Freq: Three times a day (TID) | ORAL | 1 refills | Status: DC | PRN

## 2024-01-06 MED ORDER — PREDNISONE 10 MG PO TABS
ORAL_TABLET | ORAL | 0 refills | Status: DC
Start: 2024-01-06 — End: 2024-02-11

## 2024-01-06 NOTE — Progress Notes (Signed)
 Established Patient Office Visit  Subjective:  Patient ID: Danielle Irwin, female    DOB: 03-09-1971  Age: 53 y.o. MRN: [id number]  CC:  Chief Complaint  Patient presents with   Headache    Continuing daily migraines   Discussed the use of a AI scribe software for clinical note transcription with the patient, who gave verbal consent to proceed.  HPI  Danielle Irwin is a 53 year old female with chronic migraines who presents with worsening daily headaches.  For the past six weeks, she experiences daily migraines with dull, throbbing pain escalating to an intensity of eight or nine out of ten. The pain is located across her temples, the back of her head, and down her neck, worsening by midday. Associated symptoms include nausea, dizziness, balance issues, and blurry vision. Nausea occurs three to four times a week with occasional auras described as 'a halo of light' around objects. No slurred speech or one-sided weakness is present.  She is taking Ubrelvy , propanalo so l, Qulipta , and Tylenol . She uses ice packs and stays in dark, quiet environments to manage the pain. Thunderstorms, heat, and stress exacerbate her migraines. Despite these measures, she has not had a headache-free day in the past six weeks, affecting her ability to work, particularly with computer use. She is on a waitlist to see a neurologist before her scheduled appointment in October.      Past Medical History:  Diagnosis Date   Anemia    Anxiety    Asthma    Back pain    Depression    Edema of both lower extremities    Epstein Barr infection    Family history of adverse reaction to anesthesia    FATHER WAS SLOW TO WAKE UP   Fibromyalgia    GERD (gastroesophageal reflux disease)    H/O blood clots    Headache    OCCASIONAL MIGRAINES   History of fibromyalgia    HNP (herniated nucleus pulposus), lumbar    Hx of epistaxis    Hypertension    Insomnia    Joint pain    OA (osteoarthritis)    Obesity    PCOS  (polycystic ovarian syndrome)    Sensitive skin    Sleep apnea    Vitamin D  deficiency     Past Surgical History:  Procedure Laterality Date   BREAST BIOPSY Left 09/17/2022   Stereo bx, Ribbon Clip, path pending   BREAST BIOPSY Left 09/17/2022   MM LT BREAST BX W LOC DEV 1ST LESION IMAGE BX SPEC STEREO GUIDE 09/17/2022 [facility]   COLONOSCOPY WITH PROPOFOL  N/A 09/09/2022   Procedure: COLONOSCOPY WITH PROPOFOL ;  Surgeon: Therisa Bi, MD;  Location: [facility] END[facility]COPY;  Service: Gastroenterology;  Laterality: N/A;   LUMBAR LAMINECTOMY/DECOMPRESSION MICRODISCECTOMY Right 06/02/2014   Procedure: MICRO LUMBAR DECOMPRESSION L4 - L5 ON THE RIGHT 1 LEVEL;  Surgeon: Reyes JAYSON Billing, MD;  Location: [facility] ORS;  Service: Orthopedics;  Laterality: Right;   ORIF HUMERUS FRACTURE Left 08/01/2017   Procedure: OPEN REDUCTION INTERNAL FIXATION (ORIF) PROXIMAL HUMERUS FRACTURE;  Surgeon: Kendal Franky SQUIBB, MD;  Location: [facility] OR;  Service: Orthopedics;  Laterality: Left;   WISDOM TOOTH EXTRACTION      Family History  Problem Relation Age of Onset   Diabetes Mother    High blood pressure Mother    High Cholesterol Mother    Depression Mother    Anxiety disorder Mother    Sleep apnea Mother    Obesity Mother  Heart attack Father 48   High blood pressure Father    High Cholesterol Father    Heart disease Father    Heart attack Brother    Breast cancer Maternal Aunt 50   Ovarian cancer Neg Hx     Social History   Socioeconomic History   Marital status: Single    Spouse name: Not on file   Number of children: Not on file   Years of education: Not on file   Highest education level: Some college, no degree  Occupational History   Occupation: debt collections  Tobacco Use   Smoking status: Former    Current packs/day: 0.00    Types: Cigarettes    Quit date: 05/26/1990    Years since quitting: 33.6   Smokeless tobacco: Never  Vaping Use   Vaping status: Never Used  Substance and Sexual  Activity   Alcohol use: Yes    Comment: occ   Drug use: No   Sexual activity: Not Currently    Partners: Male    Birth control/protection: Post-menopausal  Other Topics Concern   Not on file  Social History Narrative   Not on file   Social Drivers of Health   Financial Resource Strain: High Risk (01/05/2024)   Overall Financial Resource Strain (CARDIA)    Difficulty of Paying Living Expenses: Hard  Food Insecurity: Food Insecurity Present (01/05/2024)   Hunger Vital Sign    Worried About Running Out of Food in the Last Year: Sometimes true    Ran Out of Food in the Last Year: Sometimes true  Transportation Needs: No Transportation Needs (01/05/2024)   PRAPARE - Administrator, Civil Service (Medical): No    Lack of Transportation (Non-Medical): No  Physical Activity: Inactive (01/05/2024)   Exercise Vital Sign    Days of Exercise per Week: 0 days    Minutes of Exercise per Session: Not on file  Stress: Stress Concern Present (01/05/2024)   [employer] of Occupational Health - Occupational Stress Questionnaire    Feeling of Stress: Very much  Social Connections: Socially Isolated (01/05/2024)   Social Connection and Isolation Panel    Frequency of Communication with Friends and Family: Never    Frequency of Social Gatherings with Friends and Family: Never    Attends Religious Services: 1 to 4 times per year    Active Member of [employer] or Organizations: No    Attends Engineer, structural: Not on file    Marital Status: Never married  Intimate Partner Violence: Not on file     Outpatient Medications Prior to Visit  Medication Sig Dispense Refill   acetaminophen  (TYLENOL ) 650 MG CR tablet Take 650 mg by mouth every 8 (eight) hours as needed for pain.     albuterol  (PROVENTIL ) (2.5 MG/3ML) 0.083% nebulizer solution Take 3 mLs (2.5 mg total) by nebulization every 6 (six) hours as needed for wheezing or shortness of breath. 150 mL 1   albuterol  (VENTOLIN   HFA) 108 (90 Base) MCG/ACT inhaler Inhale 2 puffs into the lungs every 6 (six) hours as needed for wheezing or shortness of breath. 8 g 2   Alum & Mag Hydroxide-Simeth (ANTACID ANTI-GAS PO) Take 1 tablet by mouth daily as needed (for gas).     amoxicillin -clavulanate (AUGMENTIN ) 875-125 MG tablet Take 1 tablet by mouth 2 (two) times daily. 20 tablet 0   Ascorbic Acid  (VITAMIN C ) 1000 MG tablet Take 1,000 mg by mouth 2 (two) times daily.  aspirin  EC 81 MG tablet Take 81 mg by mouth daily. Swallow whole.     aspirin -acetaminophen -caffeine (EXCEDRIN MIGRAINE) 250-250-65 MG tablet Take 2 tablets by mouth 2 (two) times daily as needed for headache or migraine.     b complex vitamins tablet Take 1 tablet by mouth daily.     bismuth  subsalicylate (PEPTO-BISMOL) 262 MG/15ML suspension Take 30 mLs by mouth 4 (four) times daily -  before meals and at bedtime. 360 mL 0   budesonide -formoterol  (SYMBICORT ) 80-4.5 MCG/ACT inhaler Inhale 2 puffs into the lungs 2 (two) times daily. 1 each 3   busPIRone  (BUSPAR ) 5 MG tablet Take 1 tablet (5 mg total) by mouth 2 (two) times daily. 180 tablet 2   Calcium  Carbonate-Vitamin D  (CALCIUM -D PO) Take 1,200 mg by mouth 2 (two) times daily.     dicyclomine  (BENTYL ) 20 MG tablet Take 1 tablet (20 mg total) by mouth 3 (three) times daily as needed for spasms. 90 tablet 11   ECHINACEA-GOLDEN SEAL PO Take 1 tablet by mouth 2 (two) times daily as needed (immune system boost).     furosemide  (LASIX ) 20 MG tablet Take 1 tablet (20 mg total) by mouth daily. 30 tablet 3   guaiFENesin -codeine  100-10 MG/5ML syrup Take 5 mLs by mouth at bedtime as needed for cough. 60 mL 0   hydrochlorothiazide  (HYDRODIURIL ) 25 MG tablet Take 25 mg by mouth daily.     hydrOXYzine  (VISTARIL ) 25 MG capsule TAKE 1 CAPSULE BY MOUTH EVERY 8 HOURS AS NEEDED 90 capsule 0   lisinopril  (ZESTRIL ) 10 MG tablet Take 1 tablet by mouth once daily 90 tablet 1   Magnesium  250 MG TABS Take 500 mg by mouth at bedtime.  Dosage varies     Melatonin 10 MG TABS Take 1 tablet by mouth at bedtime.     meloxicam  (MOBIC ) 7.5 MG tablet Take 1 tablet (7.5 mg total) by mouth daily. 30 tablet 0   Multiple Vitamin (MULTIVITAMIN WITH MINERALS) TABS tablet Take 1 tablet by mouth daily. With Elderberry     Multiple Vitamins-Minerals (ZINC  PO) Take 500 mg by mouth daily as needed (immune system boost).     nystatin  (MYCOSTATIN /NYSTOP ) powder Apply 1 Application topically 3 (three) times daily. 60 g 1   pantoprazole  (PROTONIX ) 40 MG tablet Take 1 tablet (40 mg total) by mouth daily. 90 tablet 3   propranolol  ER (INDERAL  LA) 80 MG 24 hr capsule Take 1 capsule (80 mg total) by mouth daily. 90 capsule 1   QULIPTA  60 MG TABS Take 1 tablet by mouth daily.     rosuvastatin  (CRESTOR ) 5 MG tablet Take 1 tablet (5 mg total) by mouth at bedtime. 90 tablet 0   sertraline  (ZOLOFT ) 100 MG tablet Take 1 tablet (100 mg total) by mouth daily. 90 tablet 2   sertraline  (ZOLOFT ) 50 MG tablet Take 1 tablet by mouth once daily 90 tablet 0   traZODone  (DESYREL ) 100 MG tablet Take 1 tablet (100 mg total) by mouth at bedtime. 90 tablet 1   traZODone  (DESYREL ) 150 MG tablet Take 1 tablet (150 mg total) by mouth at bedtime. 90 tablet 1   Ubrogepant  (UBRELVY ) 100 MG TABS Take 1 tablet by mouth at onset of headache. May repeat dose x 1 after 2 hours if needed. Max 200 mg/ 24 hours. 30 tablet 1   Vitamin D , Ergocalciferol , (DRISDOL ) 1.25 MG (50000 UNIT) CAPS capsule 1 po q Wed and 1 po q Sun 8 capsule 0   vitamin k 100  MCG tablet Take 100 mcg by mouth daily.     cyclobenzaprine  (FLEXERIL ) 10 MG tablet Take 1 tablet by mouth three times daily as needed for muscle spasm 30 tablet 0   EPINEPHrine  0.3 mg/0.3 mL IJ SOAJ injection INJECT 0.3ML INTO THE MUSCLE ONCE AS DIRECTED 2 each 0   ondansetron  (ZOFRAN ) 4 MG tablet TAKE 1 TABLET BY MOUTH EVERY 8 HOURS AS NEEDED FOR NAUSEA FOR VOMITING 30 tablet 0   No facility-administered medications prior to visit.     Allergies  Allergen Reactions   Benzalkonium Chloride Anaphylaxis and Hives   Neosporin [Neomycin-Bacitracin  Zn-Polymyx] Anaphylaxis and Hives   Polysporin [Bacitracin -Polymyxin B ] Hives   Bee Venom Hives and Swelling    Throat swelling   Bupropion Hcl Other (See Comments)    angry   Cephalexin Hives and Swelling   Doxycycline  Itching    05/2023   Latex     ROS Review of Systems Negative unless indicated in HPI.    Objective:    Physical Exam Constitutional:      Appearance: She is well-developed.  HENT:     Head: Normocephalic.  Cardiovascular:     Rate and Rhythm: Normal rate and regular rhythm.     Heart sounds: Normal heart sounds.  Pulmonary:     Effort: Pulmonary effort is normal.     Breath sounds: Normal breath sounds. No stridor. No wheezing.  Musculoskeletal:     Cervical back: Normal range of motion.  Skin:    General: Skin is warm.  Neurological:     Mental Status: She is alert and oriented to person, place, and time.     Cranial Nerves: No facial asymmetry.  Psychiatric:        Mood and Affect: Mood normal.        Behavior: Behavior normal.     BP 132/82   Pulse 89   Temp 98.5 F (36.9 C)   Ht 5' 7 (1.702 m)   Wt (!) 376 lb 6.4 oz (170.7 kg)   LMP 07/11/2016   SpO2 96%   BMI 58.95 kg/m  Wt Readings from Last 3 Encounters:  01/06/24 (!) 376 lb 6.4 oz (170.7 kg)  10/10/23 (!) 380 lb (172.4 kg)  09/25/23 (!) 374 lb 12.8 oz (170 kg)     Health Maintenance  Topic Date Due   Hepatitis C Screening  Never done   Pneumococcal Vaccine: 19-49 Years (1 of 2 - PCV) Never done   Pneumococcal Vaccine: 50+ Years (1 of 2 - PCV) Never done   Hepatitis B Vaccines (1 of 3 - 19+ 3-dose series) Never done   Zoster Vaccines- Shingrix (1 of 2) Never done   COVID-19 Vaccine (1 - 2024-25 season) Never done   INFLUENZA VACCINE  01/09/2024   MAMMOGRAM  09/05/2024   DTaP/Tdap/Td (2 - Td or Tdap) 06/25/2027   Cervical Cancer Screening (HPV/Pap Cotest)   09/02/2027   Colonoscopy  09/08/2032   HIV Screening  Completed   HPV VACCINES  Aged Out   Meningococcal B Vaccine  Aged Out       Topic Date Due   Hepatitis B Vaccines (1 of 3 - 19+ 3-dose series) Never done    Lab Results  Component Value Date   TSH 2.85 03/28/2023   Lab Results  Component Value Date   WBC 7.7 04/23/2023   HGB 14.1 04/23/2023   HCT 42.4 04/23/2023   MCV 94.0 04/23/2023   PLT 259.0 04/23/2023   Lab Results  Component Value Date   NA 139 03/28/2023   K 4.0 03/28/2023   CO2 27 03/28/2023   GLUCOSE 137 (H) 03/28/2023   BUN 14 03/28/2023   CREATININE 0.81 03/28/2023   BILITOT 0.8 03/28/2023   ALKPHOS 40 03/28/2023   AST 27 03/28/2023   ALT 27 03/28/2023   PROT 7.4 03/28/2023   ALBUMIN  4.3 03/28/2023   CALCIUM  9.4 03/28/2023   ANIONGAP 10 08/02/2017   EGFR 86 02/11/2023   GFR 83.33 03/28/2023   Lab Results  Component Value Date   CHOL 245 (H) 02/11/2023   Lab Results  Component Value Date   HDL 50 02/11/2023   Lab Results  Component Value Date   LDLCALC 170 (H) 02/11/2023   Lab Results  Component Value Date   TRIG 138 02/11/2023   Lab Results  Component Value Date   CHOLHDL 5 07/18/2022   Lab Results  Component Value Date   HGBA1C 5.7 (H) 02/11/2023      Assessment & Plan:  Migraine without aura and with status migrainosus, not intractable Assessment & Plan: Chronic migraine for six weeks, daily headaches with intensity 8-9/10, associated with nausea, dizziness and eye pain. Exacerbated by environmental factors. Current medications provide limited relief. Stress and work inability are concerns. Blurry vision and balance issues noted. No slurred speech or one-sided weakness. - Prescribed prednisone  taper to break headache cycle, start in morning. - Refilled Zofran  for nausea. - Advised continued use of ice packs and resting in dark, quiet environment. - Suggested eye examination for blurry vision. - Paper work completed for  work.   Other orders -     Ondansetron  HCl; Take 1 tablet (4 mg total) by mouth every 8 (eight) hours as needed for nausea or vomiting.  Dispense: 30 tablet; Refill: 1 -     EPINEPHrine ; INJECT 0.3ML INTO THE MUSCLE ONCE AS DIRECTED  Dispense: 2 each; Refill: 0 -     Cyclobenzaprine  HCl; Take 1 tablet (10 mg total) by mouth 3 (three) times daily as needed. for muscle spams  Dispense: 30 tablet; Refill: 0 -     predniSONE ; Take 4 tablets ( total 40 mg) by mouth for 2 days; take 3 tablets ( total 30 mg) by mouth for 2 days; take 2 tablets ( total 20 mg) by mouth for 1 day; take 1 tablet ( total 10 mg) by mouth for 1 day.  Dispense: 17 tablet; Refill: 0    Follow-up: Return if symptoms worsen or fail to improve.   Nethra Mehlberg, NP

## 2024-01-08 ENCOUNTER — Ambulatory Visit: Admitting: Nurse Practitioner

## 2024-01-18 NOTE — Assessment & Plan Note (Signed)
 Chronic migraine for six weeks, daily headaches with intensity 8-9/10, associated with nausea, dizziness and eye pain. Exacerbated by environmental factors. Current medications provide limited relief. Stress and work inability are concerns. Blurry vision and balance issues noted. No slurred speech or one-sided weakness. - Prescribed prednisone  taper to break headache cycle, start in morning. - Refilled Zofran  for nausea. - Advised continued use of ice packs and resting in dark, quiet environment. - Suggested eye examination for blurry vision. - Paper work completed for work.

## 2024-01-26 ENCOUNTER — Ambulatory Visit: Admitting: Psychiatry

## 2024-02-03 ENCOUNTER — Ambulatory Visit: Admitting: Nurse Practitioner

## 2024-02-03 ENCOUNTER — Encounter: Payer: Self-pay | Admitting: Cardiology

## 2024-02-03 ENCOUNTER — Ambulatory Visit: Attending: Cardiology | Admitting: Cardiology

## 2024-02-03 ENCOUNTER — Telehealth: Payer: Self-pay

## 2024-02-03 VITALS — BP 129/79 | HR 98 | Ht 67.0 in | Wt 380.4 lb

## 2024-02-03 DIAGNOSIS — E669 Obesity, unspecified: Secondary | ICD-10-CM

## 2024-02-03 DIAGNOSIS — M79604 Pain in right leg: Secondary | ICD-10-CM | POA: Diagnosis not present

## 2024-02-03 DIAGNOSIS — R011 Cardiac murmur, unspecified: Secondary | ICD-10-CM

## 2024-02-03 DIAGNOSIS — I10 Essential (primary) hypertension: Secondary | ICD-10-CM

## 2024-02-03 DIAGNOSIS — E785 Hyperlipidemia, unspecified: Secondary | ICD-10-CM

## 2024-02-03 DIAGNOSIS — R600 Localized edema: Secondary | ICD-10-CM

## 2024-02-03 NOTE — Telephone Encounter (Signed)
 Tried to call pt to inform her that Leron Glance, Np would not be able to fill out continuous leave FMLA paper as her PCP Vincente, NP would like for that to come from Neurology who she has been referred to.   Mychart also sent

## 2024-02-03 NOTE — Patient Instructions (Signed)
 Medication Instructions:  Your physician recommends that you continue on your current medications as directed. Please refer to the Current Medication list given to you today.  *If you need a refill on your cardiac medications before your next appointment, please call your pharmacy*  Lab Work: No labs ordered today  If you have labs (blood work) drawn today and your tests are completely normal, you will receive your results only by: MyChart Message (if you have MyChart) OR A paper copy in the mail If you have any lab test that is abnormal or we need to change your treatment, we will call you to review the results.  Testing/Procedures: Your physician has requested that you have a lower extremity venous duplex. This test is an ultrasound of the veins in the legs or arms. It looks at venous blood flow that carries blood from the heart to the legs or arms. Allow one hour for a Lower Venous exam. Allow thirty minutes for an Upper Venous exam. There are no restrictions or special instructions.This will take place at [address] [facility] [address] [facility]) [address], [place] [address]  Please note: We ask at that you not bring children with you during ultrasound (echo/ vascular) testing. Due to room size and safety concerns, children are not allowed in the ultrasound rooms during exams. Our front office staff cannot provide observation of children in our lobby area while testing is being conducted. An adult accompanying a patient to their appointment will only be allowed in the ultrasound room at the discretion of the ultrasound technician under special circumstances. We apologize for any inconvenience.    Follow-Up: At [facility], you and your health needs are our priority.  As part of our continuing mission to provide you with exceptional heart care, our providers are all part of one team.  This team includes your primary Cardiologist (physician) and Advanced Practice Providers or APPs  (Physician Assistants and Nurse Practitioners) who all work together to provide you with the care you need, when you need it.  Your next appointment:   6 month(s)  Provider:   You will see one of the following Advanced Practice Providers on your designated Care Team:   Lonni Meager, NP Lesley Maffucci, PA-C Bernardino Bring, PA-C Cadence Milford, PA-C Tylene Lunch, NP Barnie Hila, NP

## 2024-02-03 NOTE — Progress Notes (Signed)
 Cardiology Office Note   Date:  02/03/2024  ID:  Danielle Irwin, Danielle Irwin Jan 28, 1971, MRN [id number] PCP: Vincente Saber, NP  [facility] Providers Cardiologist:  Deatrice Cage, MD     History of Present Illness Danielle Irwin is a 54 y.o. female with a past medical history of morbid obesity, pulm hypertension, hyperlipidemia, fibromyalgia, migraines, and sleep apnea, who is here today for follow-up.   Patient states she had been told about a heart murmur and had an echocardiogram that was completed in 2016 which showed mildly calcified aortic valve with mild aortic stenosis.  She never been told of a bicuspid aortic valve and the leaflets were not well-seen on echocardiogram.  She has a family history of coronary artery disease.   She was last seen in clinic 05/20/2023 by Dr.Arida.  She was on a referral for fluctuating blood pressure complaint of chest pain by her PCP.  She denied any chest pain or worsening dyspnea but she been having issues with labile blood pressure with high readings followed by low normal readings.  This is usually associated with dizziness.  She was placed on propranolol  recently for migraines and noticed improvement of blood pressure control.  She also takes lisinopril  and HCTZ.  She was scheduled for an updated echocardiogram.  She returns to clinic today stating overall she has been doing well.  She continues to have shortness of breath.  States that it is unchanged over the last 6 months.  She has noted peripheral edema and tries to elevate her extremities when she can.  Unfortunately she has been having a dull Ache that is unilateral to the right lower extremity.  She states is concerning as she has had prior DVT after shoulder surgery.  She also notes that she has been to be taking furosemide  20 mg daily but she only takes it on the weekends due to the inability to run back and forth to the bathroom while she is working.  Even though she has a remote work from home  job she does not have the time built into her shift to allow to be away from her desk that frequently.  She denies any hospitalizations or visits to the emergency department.  ROS: 10 point review of systems has been reviewed and considered negative exception was been listed in the HPI  Studies Reviewed EKG Interpretation Date/Time:  Tuesday February 03 2024 11:34:38 EDT Ventricular Rate:  98 PR Interval:  136 QRS Duration:  78 QT Interval:  348 QTC Calculation: 444 R Axis:   -7  Text Interpretation: Normal sinus rhythm Normal ECG When compared with ECG of 20-May-2023 14:15, No significant change was found Confirmed by Gerard Frederick ([id number]) on 02/03/2024 11:48:21 AM    2d echo 06/18/2023 1. Left ventricular ejection fraction, by estimation, is 55 to 60%. Left  ventricular ejection fraction by PLAX is 56 %. The left ventricle has  normal function. The left ventricle has no regional wall motion  abnormalities. Left ventricular diastolic  parameters were normal.   2. Right ventricular systolic function is normal. The right ventricular  size is normal. Tricuspid regurgitation signal is inadequate for assessing  PA pressure.   3. The mitral valve is normal in structure. Mild mitral valve  regurgitation. No evidence of mitral stenosis.   4. The aortic valve has an indeterminant number of cusps. Aortic valve  regurgitation is not visualized. Aortic valve sclerosis/calcification is  present, without any evidence of aortic stenosis. Aortic valve mean  gradient  measures 12.0 mmHg. Aortic valve   Vmax measures 2.41 m/s.   5. The inferior vena cava is normal in size with greater than 50%  respiratory variability, suggesting right atrial pressure of 3 mmHg.  Risk Assessment/Calculations           Physical Exam VS:  BP 129/79   Pulse 98   Ht 5' 7 (1.702 m)   Wt (!) 380 lb 6.4 oz (172.5 kg)   LMP 07/11/2016   SpO2 97%   BMI 59.58 kg/m        Wt Readings from Last 3 Encounters:   02/03/24 (!) 380 lb 6.4 oz (172.5 kg)  01/06/24 (!) 376 lb 6.4 oz (170.7 kg)  10/10/23 (!) 380 lb (172.4 kg)    GEN: Well nourished, well developed in no acute distress NECK: No JVD; No carotid bruits CARDIAC: RRR, II/VI murmurs, rubs, gallops RESPIRATORY:  Clear to auscultation without rales, wheezing or rhonchi  ABDOMEN: Soft, non-tender, non-distended EXTREMITIES:  No edema; No deformity   ASSESSMENT AND PLAN Primary hypertension with blood pressure today 129/70.  Pressures remain stable.  She has continued on furosemide  20 mg, HCTZ 25 mg daily, lisinopril  10 mg daily, and propranolol  80 mg daily.  She has been encouraged to monitor pressure 1 to 2 hours postmedication administration at home as well.  EKG today reveals sinus rhythm with a rate of 98 with no significant change from prior studies.  Mixed hyperlipidemia with last LDL of 170 11 months ago.  She has upcoming labs with primary care provider.  Recommend for LDL below 100.  She is currently continued on rosuvastatin  5 mg at bedtime.  Ongoing management per PCP.  Cardiac murmur with recent echocardiogram completed revealed LVEF 55 to 60%, no RWMA, mild mitral regurgitation, aortic valve with an indeterminate number of cusps, aortic valve sclerosis without evidence of aortic stenosis with aortic valve mean gradient measured 12 mmHg.  Will continue to monitor with surveillance echocardiograms.  Superobesity with a BMI of 59.58.  She would likely benefit from GLP-1 medications.  She is already established with a weight loss clinic.  Weight loss would be beneficial.  Unilateral leg pain with peripheral edema.  Patient states that she has a nagging discomfort to the back of her cath without warmth knots or streaking.  She states it feels similar to the discomfort that she had in the early stages of a DVT previously after her shoulder surgery.  She has been scheduled for an venous ultrasound to rule out a DVT to the right lower  extremity.       Dispo: Patient return to clinic to see MD/APP in 6 months or sooner if needed for further evaluation.  Patient has been advised that if ultrasound comes back positive for DVT prior to be starting on blood thinning medications she would be called for a return appointment sooner than 6 months.  Signed, Gautam Langhorst, NP

## 2024-02-04 ENCOUNTER — Other Ambulatory Visit: Payer: Self-pay | Admitting: Nurse Practitioner

## 2024-02-06 ENCOUNTER — Telehealth: Admitting: Physician Assistant

## 2024-02-06 ENCOUNTER — Ambulatory Visit: Payer: Self-pay | Admitting: Cardiology

## 2024-02-06 ENCOUNTER — Ambulatory Visit
Admission: RE | Admit: 2024-02-06 | Discharge: 2024-02-06 | Disposition: A | Discharge status: Home / Self Care | Source: Ambulatory Visit | Attending: Cardiology | Admitting: Cardiology

## 2024-02-06 DIAGNOSIS — W5503XA Scratched by cat, initial encounter: Secondary | ICD-10-CM

## 2024-02-06 DIAGNOSIS — L0390 Cellulitis, unspecified: Secondary | ICD-10-CM

## 2024-02-06 DIAGNOSIS — M79604 Pain in right leg: Secondary | ICD-10-CM | POA: Diagnosis not present

## 2024-02-06 MED ORDER — AMOXICILLIN-POT CLAVULANATE 875-125 MG PO TABS: 1.0000 | ORAL_TABLET | Freq: Two times a day (BID) | ORAL | 0 refills | Status: DC

## 2024-02-06 NOTE — Patient Instructions (Signed)
 Jennene M Portlock, thank you for joining Delon CHRISTELLA Dickinson, PA-C for today's virtual visit.  While this provider is not your primary care provider (PCP), if your PCP is located in our provider database this encounter information will be shared with them immediately following your visit.   A [place] MyChart account gives you access to today's visit and all your visits, tests, and labs performed at [facility]  click here if you don't have a [facility] MyChart account or go to [email or web address]  Consent: (Patient) Eternity M Rolland provided verbal consent for this virtual visit at the beginning of the encounter.  Current Medications:  Current Outpatient Medications:    amoxicillin -clavulanate (AUGMENTIN ) 875-125 MG tablet, Take 1 tablet by mouth 2 (two) times daily., Disp: 14 tablet, Rfl: 0   acetaminophen  (TYLENOL ) 650 MG CR tablet, Take 650 mg by mouth every 8 (eight) hours as needed for pain., Disp: , Rfl:    albuterol  (PROVENTIL ) (2.5 MG/3ML) 0.083% nebulizer solution, Take 3 mLs (2.5 mg total) by nebulization every 6 (six) hours as needed for wheezing or shortness of breath., Disp: 150 mL, Rfl: 1   albuterol  (VENTOLIN  HFA) 108 (90 Base) MCG/ACT inhaler, Inhale 2 puffs into the lungs every 6 (six) hours as needed for wheezing or shortness of breath., Disp: 8 g, Rfl: 2   Alum & Mag Hydroxide-Simeth (ANTACID ANTI-GAS PO), Take 1 tablet by mouth daily as needed (for gas)., Disp: , Rfl:    Ascorbic Acid  (VITAMIN C ) 1000 MG tablet, Take 1,000 mg by mouth 2 (two) times daily., Disp: , Rfl:    aspirin  EC 81 MG tablet, Take 81 mg by mouth daily. Swallow whole., Disp: , Rfl:    aspirin -acetaminophen -caffeine (EXCEDRIN MIGRAINE) 250-250-65 MG tablet, Take 2 tablets by mouth 2 (two) times daily as needed for headache or migraine., Disp: , Rfl:    b complex vitamins tablet, Take 1 tablet by mouth daily., Disp: , Rfl:    bismuth  subsalicylate (PEPTO-BISMOL) 262 MG/15ML suspension, Take  30 mLs by mouth 4 (four) times daily -  before meals and at bedtime., Disp: 360 mL, Rfl: 0   budesonide -formoterol  (SYMBICORT ) 80-4.5 MCG/ACT inhaler, Inhale 2 puffs into the lungs 2 (two) times daily., Disp: 1 each, Rfl: 3   busPIRone  (BUSPAR ) 5 MG tablet, Take 1 tablet (5 mg total) by mouth 2 (two) times daily., Disp: 180 tablet, Rfl: 2   Calcium  Carbonate-Vitamin D  (CALCIUM -D PO), Take 1,200 mg by mouth 2 (two) times daily., Disp: , Rfl:    cyclobenzaprine  (FLEXERIL ) 10 MG tablet, Take 1 tablet by mouth three times daily as needed for muscle spasm, Disp: 30 tablet, Rfl: 0   dicyclomine  (BENTYL ) 20 MG tablet, Take 1 tablet (20 mg total) by mouth 3 (three) times daily as needed for spasms., Disp: 90 tablet, Rfl: 11   ECHINACEA-GOLDEN SEAL PO, Take 1 tablet by mouth 2 (two) times daily as needed (immune system boost)., Disp: , Rfl:    EPINEPHrine  0.3 mg/0.3 mL IJ SOAJ injection, INJECT 0.3ML INTO THE MUSCLE ONCE AS DIRECTED, Disp: 2 each, Rfl: 0   furosemide  (LASIX ) 20 MG tablet, Take 1 tablet (20 mg total) by mouth daily., Disp: 30 tablet, Rfl: 3   guaiFENesin -codeine  100-10 MG/5ML syrup, Take 5 mLs by mouth at bedtime as needed for cough., Disp: 60 mL, Rfl: 0   hydrochlorothiazide  (HYDRODIURIL ) 25 MG tablet, Take 25 mg by mouth daily., Disp: , Rfl:    hydrOXYzine  (VISTARIL ) 25 MG capsule, TAKE 1 CAPSULE BY MOUTH  EVERY 8 HOURS AS NEEDED, Disp: 90 capsule, Rfl: 0   lisinopril  (ZESTRIL ) 10 MG tablet, Take 1 tablet by mouth once daily, Disp: 90 tablet, Rfl: 1   Magnesium  250 MG TABS, Take 500 mg by mouth at bedtime. Dosage varies, Disp: , Rfl:    Melatonin 10 MG TABS, Take 1 tablet by mouth at bedtime., Disp: , Rfl:    meloxicam  (MOBIC ) 7.5 MG tablet, Take 1 tablet (7.5 mg total) by mouth daily. (Patient not taking: Reported on 02/03/2024), Disp: 30 tablet, Rfl: 0   Multiple Vitamin (MULTIVITAMIN WITH MINERALS) TABS tablet, Take 1 tablet by mouth daily. With Elderberry, Disp: , Rfl:    Multiple  Vitamins-Minerals (ZINC  PO), Take 500 mg by mouth daily as needed (immune system boost)., Disp: , Rfl:    nystatin  (MYCOSTATIN /NYSTOP ) powder, Apply 1 Application topically 3 (three) times daily., Disp: 60 g, Rfl: 1   ondansetron  (ZOFRAN ) 4 MG tablet, Take 1 tablet (4 mg total) by mouth every 8 (eight) hours as needed for nausea or vomiting., Disp: 30 tablet, Rfl: 1   pantoprazole  (PROTONIX ) 40 MG tablet, Take 1 tablet (40 mg total) by mouth daily., Disp: 90 tablet, Rfl: 3   predniSONE  (DELTASONE ) 10 MG tablet, Take 4 tablets ( total 40 mg) by mouth for 2 days; take 3 tablets ( total 30 mg) by mouth for 2 days; take 2 tablets ( total 20 mg) by mouth for 1 day; take 1 tablet ( total 10 mg) by mouth for 1 day. (Patient not taking: Reported on 02/03/2024), Disp: 17 tablet, Rfl: 0   propranolol  ER (INDERAL  LA) 80 MG 24 hr capsule, Take 1 capsule (80 mg total) by mouth daily., Disp: 90 capsule, Rfl: 1   QULIPTA  60 MG TABS, Take 1 tablet by mouth daily., Disp: , Rfl:    rosuvastatin  (CRESTOR ) 5 MG tablet, Take 1 tablet (5 mg total) by mouth at bedtime., Disp: 90 tablet, Rfl: 0   sertraline  (ZOLOFT ) 100 MG tablet, Take 1 tablet (100 mg total) by mouth daily., Disp: 90 tablet, Rfl: 2   sertraline  (ZOLOFT ) 50 MG tablet, Take 1 tablet by mouth once daily, Disp: 90 tablet, Rfl: 0   traZODone  (DESYREL ) 100 MG tablet, Take 1 tablet (100 mg total) by mouth at bedtime. (Patient not taking: Reported on 02/03/2024), Disp: 90 tablet, Rfl: 1   traZODone  (DESYREL ) 150 MG tablet, Take 1 tablet (150 mg total) by mouth at bedtime., Disp: 90 tablet, Rfl: 1   Ubrogepant  (UBRELVY ) 100 MG TABS, Take 1 tablet by mouth at onset of headache. May repeat dose x 1 after 2 hours if needed. Max 200 mg/ 24 hours., Disp: 30 tablet, Rfl: 1   Vitamin D , Ergocalciferol , (DRISDOL ) 1.25 MG (50000 UNIT) CAPS capsule, 1 po q Wed and 1 po q Sun, Disp: 8 capsule, Rfl: 0   vitamin k 100 MCG tablet, Take 100 mcg by mouth daily., Disp: , Rfl:     Medications ordered in this encounter:  Meds ordered this encounter  Medications   amoxicillin -clavulanate (AUGMENTIN ) 875-125 MG tablet    Sig: Take 1 tablet by mouth 2 (two) times daily.    Dispense:  14 tablet    Refill:  0    Supervising Provider:   BLAISE ALEENE KIDD [[id number]]     *If you need refills on other medications prior to your next appointment, please contact your pharmacy*  Follow-Up: Call back or seek an in-person evaluation if the symptoms worsen or if the condition fails to  improve as anticipated.  [facility] [phone]  Other Instructions  Cat-Scratch Disease, Adult Cat-scratch disease is a bacterial infection that is spread to humans through a bite or scratch from an infected cat. The infection can also be spread by having contact with an infected cat and then touching your eyes or mouth. It is sometimes called cat-scratch fever. The infection does not spread from person to person (is not contagious). What are the causes? This condition is caused by bacteria called Bartonella henselae. These bacteria are carried by fleas. Cats get infected from flea bites or flea droppings. The bacteria may be present in the mouth or on the claws of cats or kittens, especially those that are younger than 12 year old. Cats do not look or act sick when they have this infection. What increases the risk? You are more likely to develop this condition if: You own or interact with a cat. You have a weakened body defense system (immune system). Your immune system may be weakened if: You are pregnant. You have certain conditions like cancer, HIV, or AIDS. You received a donated organ (transplant). What are the signs or symptoms? Common symptoms of this condition include: A bite or scratch that does not heal. A red bump near the wound. The bump may be red, warm, and tender to the touch. Other symptoms may take a few weeks to develop. They may include: Swollen, tender  glands (lymph nodes) in the neck or under the arms. Fever. Rash. Joint pain. Headache. Low energy(listlessness). Loss of appetite. How is this diagnosed? This condition is diagnosed based on your symptoms and your history of a scratch or bite from a cat. Your health care provider will examine your skin and look for swollen lymph nodes. You may have tests, such as: A culture test. This involves testing a sample of fluid or pus from your wound. Blood tests. A lymph node biopsy. This involves removing and testing a tissue sample from a swollen lymph node. How is this treated? Cat-scratch disease usually goes away without treatment in 2-4 months. However, a more severe infection may require antibiotic medicine if: You have other illnesses or problems that weaken the immune system. Your symptoms cause discomfort. If you have a painful, swollen lymph node, it may need to be drained with a needle. This is rare. Follow these instructions at home: Medicines Take over-the-counter and prescription medicines only as told by your health care provider. If you were prescribed an antibiotic medicine, take it as told by your health care provider. Do not stop taking the antibiotic even if you start to feel better. General instructions Return to your normal activities as told by your health care provider. Ask your health care provider what activities are safe for you. Apply warm compresses to the area as told your health care provider. Check your wound or swollen lymph node areas every day for signs of infection. Check for: Redness, swelling, or pain. Fluid or blood. Warmth. Pus or a bad smell. Keep all follow-up visits. This is important. How is this prevented? Avoid playing roughly or taking food away from a cat. Wash your hands well after you have contact with a cat. If you get scratched or bitten, wash the injured area as soon as possible with warm water and soap. Do not let a cat lick your skin  if you have any cuts, sores, or scratches. Do not pick up or play with stray cats. If you have an indoor cat: Do  not let your cat outside. Having contact with stray cats may make your cat more likely to have contact with the bacteria. Trim your cat's nails. Check your cat for fleas. Ask your veterinarian which flea and tick repellent is safe to use for you and your cat. Contact a health care provider if: You have signs of infection in your wound or lymph nodes, such as: Redness, swelling, or pain. Fluid or blood. Warmth. Pus or bad smell. You have a fever. Your symptoms get worse or do not get better. You have pain in your bones. Get help right away if: You develop pain in your abdomen. Your skin rash spreads. You feel dizzy or you faint. You develop inflammation of your eye or vision problems. You develop a stiff neck. These symptoms may represent a serious problem that is an emergency. Do not wait to see if the symptoms will go away. Get medical help right away. Call your local emergency services (911 in the [place] Do not drive yourself to the hospital. Summary Cat-scratch disease, sometimes called cat-scratch fever, is a bacterial infection that is caused by a bite or scratch from an infected cat. The infection can cause a red bump near the wound, swollen lymph nodes, and flu-like symptoms. Bacteria that cause this condition may be present in the mouth or on the claws of cats or kittens, especially those that are younger than 53 year old. If you were prescribed an antibiotic medicine, take it as told by your health care provider. Do not stop taking the antibiotic even if you start to feel better. This information is not intended to replace advice given to you by your health care provider. Make sure you discuss any questions you have with your health care provider. Document Revised: 10/12/2020 Document Reviewed: 10/12/2020 Elsevier Patient Education  2024 Elsevier Inc.   If you have  been instructed to have an in-person evaluation today at a local Urgent Care facility, please use the link below. It will take you to a list of all of our available [facility], including address, phone number and hours of operation. Please do not delay care.  [facility]  If you or a family member do not have a primary care provider, use the link below to schedule a visit and establish care. When you choose a [facility] primary care physician or advanced practice provider, you gain a long-term partner in health. Find a Primary Care Provider  Learn more about [facility]'s in-office and virtual care options:  - Get Care Now

## 2024-02-06 NOTE — Progress Notes (Signed)
 No evidence of DVT or abnormal fluid collection.

## 2024-02-06 NOTE — Progress Notes (Addendum)
 Virtual Visit Consent   Danielle Irwin, you are scheduled for a virtual visit with a [facility] provider today. Just as with appointments in the office, your consent must be obtained to participate. Your consent will be active for this visit and any virtual visit you may have with one of our providers in the next 365 days. If you have a MyChart account, a copy of this consent can be sent to you electronically.  As this is a virtual visit, video technology does not allow for your provider to perform a traditional examination. This may limit your provider's ability to fully assess your condition. If your provider identifies any concerns that need to be evaluated in person or the need to arrange testing (such as labs, EKG, etc.), we will make arrangements to do so. Although advances in technology are sophisticated, we cannot ensure that it will always work on either your end or our end. If the connection with a video visit is poor, the visit may have to be switched to a telephone visit. With either a video or telephone visit, we are not always able to ensure that we have a secure connection.  By engaging in this virtual visit, you consent to the provision of healthcare and authorize for your insurance to be billed (if applicable) for the services provided during this visit. Depending on your insurance coverage, you may receive a charge related to this service.  I need to obtain your verbal consent now. Are you willing to proceed with your visit today? Danielle Irwin has provided verbal consent on 02/12/2024 for a virtual visit (video or telephone). Delon CHRISTELLA Dickinson, PA-C  Date: 02/12/2024 10:54 AM   Virtual Visit via Video Note   IDelon CHRISTELLA Dickinson, connected with  SHERICKA JOHNSTONE  ([id number], 1971/03/04) on 02/12/24 at  4:30 PM EDT by a video-enabled telemedicine application and verified that I am speaking with the correct person using two identifiers.  Location: Patient: Virtual Visit Location  Patient: Home Provider: Virtual Visit Location Provider: Home Office   I discussed the limitations of evaluation and management by telemedicine and the availability of in person appointments. The patient expressed understanding and agreed to proceed.    History of Present Illness: Danielle Irwin is a 53 y.o. who identifies as a female who was assigned female at birth, and is being seen today for cat scratches on torso. Scratches happened about a week ago. Has been cleaning daily with alcohol. Now has redness and swelling and increased tenderness around these areas. Does have a low grade fever 99.3 over last few days. Having a mild headache. Has had mild nausea, but unsure if this is from this or if from her IBS.   Problems:  Patient Active Problem List   Diagnosis Date Noted   Allergic rhinitis 07/29/2023   Acute cough 06/03/2023   Migraine without aura and with status migrainosus, not intractable 04/08/2023   Intractable migraine without aura and with status migrainosus 04/08/2023   Fluctuating blood pressure 03/28/2023   Lightheadedness 03/28/2023   Wheezing 03/11/2023   Candidiasis, cutaneous 03/10/2023   Respiratory illness 03/05/2023   Prediabetes 02/11/2023   OSA (obstructive sleep apnea) 02/11/2023   Physically inactive 02/11/2023   Diarrhea 02/09/2023   Tick bite of left lower leg 02/09/2023   Nausea and vomiting 12/19/2022   Gastroenteritis 09/26/2022   Acute pain of left foot 08/27/2022   Pain in joint, ankle and foot 08/22/2022   Elevated liver function tests 08/14/2022  Hyperlipidemia 08/14/2022   Intractable chronic migraine with aura with status migrainosus 08/14/2022   Vitamin D  deficiency 08/08/2022   Low back pain 02/01/2021   Low back strain 02/01/2021   Strain of neck muscle 02/01/2021   Bronchitis 03/25/2019   Sinus infection 10/26/2018   Suspected COVID-19 virus infection 10/08/2018   Influenza-like illness 09/28/2018   Fibromyalgia 05/12/2018   Edema  05/12/2018   BMI 60.0-69.9, adult (HCC) 05/12/2018   DDD (degenerative disc disease), thoracolumbar 01/06/2018   DVT (deep venous thrombosis) (HCC) 08/29/2017   Leukocytosis 08/06/2017   Humerus fracture 08/01/2017   CPAP (continuous positive airway pressure) dependence 08/01/2017   Asthma 08/01/2017   HTN (hypertension) 06/24/2017   Anxiety and depression 11/07/2015   Systolic murmur 11/11/2014   Other fatigue 10/26/2014   Morbid obesity (HCC) 10/26/2014   Upper respiratory tract infection 06/21/2014   Insomnia 06/21/2014   Anxiety 06/21/2014   Spinal stenosis at L4-L5 level 06/02/2014   SOB (shortness of breath) on exertion 08/13/2010   POLYCYSTIC OVARIAN DISEASE 06/19/2010    Allergies:  Allergies  Allergen Reactions   Benzalkonium Chloride Anaphylaxis and Hives   Neosporin [Neomycin-Bacitracin  Zn-Polymyx] Anaphylaxis and Hives   Polysporin [Bacitracin -Polymyxin B ] Hives   Bee Venom Hives and Swelling    Throat swelling   Bupropion Hcl Other (See Comments)    angry   Cephalexin Hives and Swelling   Doxycycline  Itching    05/2023   Latex    Medications:  Current Outpatient Medications:    amoxicillin -clavulanate (AUGMENTIN ) 875-125 MG tablet, Take 1 tablet by mouth 2 (two) times daily., Disp: 14 tablet, Rfl: 0   acetaminophen  (TYLENOL ) 650 MG CR tablet, Take 650 mg by mouth every 8 (eight) hours as needed for pain., Disp: , Rfl:    albuterol  (PROVENTIL ) (2.5 MG/3ML) 0.083% nebulizer solution, Take 3 mLs (2.5 mg total) by nebulization every 6 (six) hours as needed for wheezing or shortness of breath., Disp: 150 mL, Rfl: 1   albuterol  (VENTOLIN  HFA) 108 (90 Base) MCG/ACT inhaler, Inhale 2 puffs into the lungs every 6 (six) hours as needed for wheezing or shortness of breath., Disp: 8 g, Rfl: 2   Alum & Mag Hydroxide-Simeth (ANTACID ANTI-GAS PO), Take 1 tablet by mouth daily as needed (for gas)., Disp: , Rfl:    Ascorbic Acid  (VITAMIN C ) 1000 MG tablet, Take 1,000 mg by mouth  2 (two) times daily., Disp: , Rfl:    aspirin  EC 81 MG tablet, Take 81 mg by mouth daily. Swallow whole., Disp: , Rfl:    aspirin -acetaminophen -caffeine (EXCEDRIN MIGRAINE) 250-250-65 MG tablet, Take 2 tablets by mouth 2 (two) times daily as needed for headache or migraine., Disp: , Rfl:    b complex vitamins tablet, Take 1 tablet by mouth daily., Disp: , Rfl:    bismuth  subsalicylate (PEPTO-BISMOL) 262 MG/15ML suspension, Take 30 mLs by mouth 4 (four) times daily -  before meals and at bedtime., Disp: 360 mL, Rfl: 0   budesonide -formoterol  (SYMBICORT ) 80-4.5 MCG/ACT inhaler, Inhale 2 puffs into the lungs 2 (two) times daily., Disp: 1 each, Rfl: 3   busPIRone  (BUSPAR ) 5 MG tablet, Take 1 tablet (5 mg total) by mouth 2 (two) times daily., Disp: 180 tablet, Rfl: 2   Calcium  Carbonate-Vitamin D  (CALCIUM -D PO), Take 1,200 mg by mouth 2 (two) times daily., Disp: , Rfl:    cyclobenzaprine  (FLEXERIL ) 10 MG tablet, Take 1 tablet by mouth three times daily as needed for muscle spasm, Disp: 30 tablet, Rfl: 0  dicyclomine  (BENTYL ) 20 MG tablet, Take 1 tablet (20 mg total) by mouth 3 (three) times daily as needed for spasms., Disp: 90 tablet, Rfl: 11   ECHINACEA-GOLDEN SEAL PO, Take 1 tablet by mouth 2 (two) times daily as needed (immune system boost)., Disp: , Rfl:    eletriptan  (RELPAX ) 40 MG tablet, Take 1 tablet (40 mg total) by mouth as needed for migraine or headache. May repeat in 2 hours if headache persists or recurs.  Maximum 2 tablets in 24 hours., Disp: 10 tablet, Rfl: 5   EPINEPHrine  0.3 mg/0.3 mL IJ SOAJ injection, INJECT 0.3ML INTO THE MUSCLE ONCE AS DIRECTED (Patient taking differently: Inject 0.3 mg into the muscle as needed. INJECT 0.3ML INTO THE MUSCLE ONCE AS DIRECTED), Disp: 2 each, Rfl: 0   furosemide  (LASIX ) 20 MG tablet, Take 1 tablet (20 mg total) by mouth daily. (Patient taking differently: Take 20 mg by mouth daily. Only on the weekends), Disp: 30 tablet, Rfl: 3   guaiFENesin -codeine   100-10 MG/5ML syrup, Take 5 mLs by mouth at bedtime as needed for cough., Disp: 60 mL, Rfl: 0   hydrochlorothiazide  (HYDRODIURIL ) 25 MG tablet, Take 25 mg by mouth daily., Disp: , Rfl:    hydrOXYzine  (VISTARIL ) 25 MG capsule, TAKE 1 CAPSULE BY MOUTH EVERY 8 HOURS AS NEEDED, Disp: 90 capsule, Rfl: 0   lisinopril  (ZESTRIL ) 10 MG tablet, Take 1 tablet by mouth once daily, Disp: 90 tablet, Rfl: 1   Magnesium  250 MG TABS, Take 500 mg by mouth at bedtime. Dosage varies, Disp: , Rfl:    Melatonin 10 MG TABS, Take 1 tablet by mouth at bedtime., Disp: , Rfl:    Multiple Vitamin (MULTIVITAMIN WITH MINERALS) TABS tablet, Take 1 tablet by mouth daily. With Elderberry, Disp: , Rfl:    Multiple Vitamins-Minerals (ZINC  PO), Take 500 mg by mouth daily as needed (immune system boost)., Disp: , Rfl:    nystatin  (MYCOSTATIN /NYSTOP ) powder, Apply 1 Application topically 3 (three) times daily., Disp: 60 g, Rfl: 1   ondansetron  (ZOFRAN ) 4 MG tablet, Take 1 tablet (4 mg total) by mouth every 8 (eight) hours as needed for nausea or vomiting., Disp: 30 tablet, Rfl: 1   pantoprazole  (PROTONIX ) 40 MG tablet, Take 1 tablet (40 mg total) by mouth daily., Disp: 90 tablet, Rfl: 3   propranolol  ER (INDERAL  LA) 80 MG 24 hr capsule, Take 1 capsule (80 mg total) by mouth daily., Disp: 90 capsule, Rfl: 1   QULIPTA  60 MG TABS, Take 1 tablet by mouth daily., Disp: , Rfl:    rosuvastatin  (CRESTOR ) 5 MG tablet, Take 1 tablet (5 mg total) by mouth at bedtime., Disp: 90 tablet, Rfl: 0   sertraline  (ZOLOFT ) 100 MG tablet, Take 1 tablet (100 mg total) by mouth daily., Disp: 90 tablet, Rfl: 2   sertraline  (ZOLOFT ) 50 MG tablet, Take 1 tablet by mouth once daily, Disp: 90 tablet, Rfl: 0   traZODone  (DESYREL ) 150 MG tablet, Take 1 tablet (150 mg total) by mouth at bedtime., Disp: 90 tablet, Rfl: 1   Ubrogepant  (UBRELVY ) 100 MG TABS, Take 1 tablet by mouth at onset of headache. May repeat dose x 1 after 2 hours if needed. Max 200 mg/ 24 hours.,  Disp: 30 tablet, Rfl: 1   Vitamin D , Ergocalciferol , (DRISDOL ) 1.25 MG (50000 UNIT) CAPS capsule, 1 po q Wed and 1 po q Sun, Disp: 8 capsule, Rfl: 0   vitamin k 100 MCG tablet, Take 100 mcg by mouth daily., Disp: , Rfl:  Observations/Objective: Patient is well-developed, well-nourished in no acute distress.  Resting comfortably at home.  Head is normocephalic, atraumatic.  No labored breathing.  Speech is clear and coherent with logical content.  Patient is alert and oriented at baseline.    Assessment and Plan: 1. Wound cellulitis (Primary)  2. Cat scratch - amoxicillin -clavulanate (AUGMENTIN ) 875-125 MG tablet; Take 1 tablet by mouth 2 (two) times daily.  Dispense: 14 tablet; Refill: 0  - Augmentin  prescribed - Advised to stop cleaning with alcohol - Clean with soap and water once or twice daily - Seek in person evaluation if symptoms worsen  Follow Up Instructions: I discussed the assessment and treatment plan with the patient. The patient was provided an opportunity to ask questions and all were answered. The patient agreed with the plan and demonstrated an understanding of the instructions.  A copy of instructions were sent to the patient via MyChart unless otherwise noted below.    The patient was advised to call back or seek an in-person evaluation if the symptoms worsen or if the condition fails to improve as anticipated.    Delon CHRISTELLA Dickinson, PA-C

## 2024-02-10 NOTE — Progress Notes (Unsigned)
 NEUROLOGY FOLLOW UP OFFICE NOTE  Danielle Irwin [id number]  Assessment/Plan:   Chronic migraine with aura, without status migrainosus, intractable Hypertension, usually controlled.  Elevated today  Migraine prevention:  Plan to change from Qulipta  to Vyepti 300mg  every 3 months Migraine rescue:  Ubrelvy .  Zofran  at earliest onset as well.  Alternatively, will also prescribe eletriptan  40mg  Limit use of pain relievers to no more than 9 days out of the month to prevent risk of rebound or medication-overuse headache. Keep headache diary Follow up with PCP or cardiology regarding blood pressure Follow up 7 months.  Total time spent on today's visit was 33 minutes dedicated to this patient today, preparing to see patient, examining the patient, ordering tests and/or medications and counseling the patient, documenting clinical information in the EHR or other health record, discussing treatment and goals, answering patient's questions and coordinating care.      Subjective:  Danielle Irwin is a 53 year old right-handed female HTN, fibromyalgia, arthritis, asthma, PCOS, depression and anxiety who follows up for migraines.  MRI of brain personally reviewed.  UPDATE: Started Qulipta .   Having daily headaches but she thinks it is related to increased stress.  Her mother has been ill since late March.  Also, headaches have been aggravated by working on the computer at work.  She hasn't been to work since March.  The change in barometric pressure Intensity:  4-5/10 up to 8/10 Duration:  5-6 hours with Ubrelvy  if taken early.  She tried Nurtec which maybe started aborting the headache a little sooner than Ubrelvy .   Frequency:  daily Frequency of abortive medication: Takes Excedrin 2 days a week, Tylenol  and ibuprofen  for her back about 3-4 days a week Rescue protocol:  Ubrelvy .  Occasionally Excedrin Current NSAIDS/analgesics:  ibuprofen  (back), Tylenol , ASA 81mg  daily Current triptans:   none Current ergotamine:  none Current anti-emetic:  ondansetron  4mg  Current muscle relaxants:  cyclobenzaprine  10mg  Current Antihypertensive medications:  propranolol  ER 80mg  daily, lisinopril , hydrochlorothiazide  (edema), Lasix  (edema) Current Antidepressant medications:  sertraline  100mg  daily Current Anticonvulsant medications:  none Current anti-CGRP:  Ubrelvy  100mg , Qulipta  60mg  daily Current Vitamins/Herbal/Supplements:  magnesium , melatonin, B complex, C, D, MVI Current Antihistamines/Decongestants:  none Other therapy:  none Other medications:  trazodone  100mg  at bedtime (insomnia),  hydroxyzine  (anxiety), albuterol    Caffeine:  occasional coffee (aggravates IBS), rarely soda (2-3 a week) Diet:  Flavored water.  Skips meals Exercise:  no Depression/Anxiety:  yes.  Father just passed away Other pain:  fibromyalgia, degenerative disc disease Sleep hygiene:  poor.  Trouble falling asleep and staying asleep.  Has OSA.  Uses CPAP.  Uses trazodone  - not helpful.  Average 5-6 hours a night Works at a computer and often on the phone  HISTORY: Onset:  off and on since teenager.  Frequent and more severe over last 4-6 months Location:  left hemisphere, left eye, sometimes bilateral Quality:  stabbing in eye, pounding and squeezing of head Intensity:  4-5/10, 8/10 when severe.  She denies new headache, thunderclap headache or severe headache that wakes her from sleep. Aura:  Occasionally visual aura with wavy lines around objects lasting 1-2 days (occurs during migraine and lasting 1-2 days) Prodrome:  myofascial pain in shoulders and neck Associated symptoms:  nausea, photophobia, phonophobia, sometimes dizziness/vertigo, occasionally vomiting.  She denies associated unilateral numbness or weakness. Duration:  3-10 days (usually 1 week) Frequency:  5-6 in last 2 months lasting days to week Frequency of abortive medication: daily (also treating  fibromyalgia and degenerative disc  disease) Triggers:  fluoride, stress, change in weather Relieving factors:  ice pack Activity:  movement aggravates. Missed work 12-14 days in last month.   From teenager to 24s, had complete black out of vision for 20-30 minutes.  Not had for years.  Likely migraine aura.    She does get routine eye exams.    Imaging: 05/07/2011 MRI BRAIN WO: mild scattered nonspecific tiny white matter hyperintensities in the bilateral cerebral hemispheres as well as small 8 mm pineal gland cyst but no acute findings.   06/09/2023 MRI BRAIN W WO:  1. No evidence of an acute intracranial abnormality. 2. Multifocal T2 FLAIR hyperintense signal abnormality within the cerebral white matter, overall mild but greater than expected for age. Findings have progressed from the prior brain MRI of 05/07/2011. These signal changes are nonspecific and differential considerations include chronic small vessel ischemic disease, sequela of chronic migraine headaches, sequela of a prior infectious/inflammatory process and sequela of demyelinating disease, among others. 3. Unchanged 6 mm pineal cyst. 4. Minimal fluid within the right mastoid air cells.  Past NSAIDS/analgesics:  naproxen, meloxicam , Excedrin Past abortive triptans:  sumatriptan  tab Past abortive ergotamine:  none Past muscle relaxants:  methocarbamol , metaxalone  Past anti-emetic:  none Past antihypertensive medications:  none Past antidepressant medications:  duloxetine Past anticonvulsant medications:  pregablin (blurred vision) Past anti-CGRP:  none Past antihistamines/decongestants:  Sudafed, Benadryl , Flonase  Other past therapies:  none   Family history of headache:  no Other family history:  Father (Parkinson's disease)  PAST MEDICAL HISTORY: Past Medical History:  Diagnosis Date   Anemia    Anxiety    Asthma    Back pain    Depression    Edema of both lower extremities    Epstein Barr infection    Family history of adverse reaction to  anesthesia    FATHER WAS SLOW TO WAKE UP   Fibromyalgia    GERD (gastroesophageal reflux disease)    H/O blood clots    Headache    OCCASIONAL MIGRAINES   History of fibromyalgia    HNP (herniated nucleus pulposus), lumbar    Hx of epistaxis    Hypertension    Insomnia    Joint pain    OA (osteoarthritis)    Obesity    PCOS (polycystic ovarian syndrome)    Sensitive skin    Sleep apnea    Vitamin D  deficiency     MEDICATIONS: Current Outpatient Medications on File Prior to Visit  Medication Sig Dispense Refill   acetaminophen  (TYLENOL ) 650 MG CR tablet Take 650 mg by mouth every 8 (eight) hours as needed for pain.     albuterol  (PROVENTIL ) (2.5 MG/3ML) 0.083% nebulizer solution Take 3 mLs (2.5 mg total) by nebulization every 6 (six) hours as needed for wheezing or shortness of breath. 150 mL 1   albuterol  (VENTOLIN  HFA) 108 (90 Base) MCG/ACT inhaler Inhale 2 puffs into the lungs every 6 (six) hours as needed for wheezing or shortness of breath. 8 g 2   Alum & Mag Hydroxide-Simeth (ANTACID ANTI-GAS PO) Take 1 tablet by mouth daily as needed (for gas).     amoxicillin -clavulanate (AUGMENTIN ) 875-125 MG tablet Take 1 tablet by mouth 2 (two) times daily. 14 tablet 0   Ascorbic Acid  (VITAMIN C ) 1000 MG tablet Take 1,000 mg by mouth 2 (two) times daily.     aspirin  EC 81 MG tablet Take 81 mg by mouth daily. Swallow whole.     aspirin -acetaminophen -caffeine (  EXCEDRIN MIGRAINE) 250-250-65 MG tablet Take 2 tablets by mouth 2 (two) times daily as needed for headache or migraine.     b complex vitamins tablet Take 1 tablet by mouth daily.     bismuth  subsalicylate (PEPTO-BISMOL) 262 MG/15ML suspension Take 30 mLs by mouth 4 (four) times daily -  before meals and at bedtime. 360 mL 0   budesonide -formoterol  (SYMBICORT ) 80-4.5 MCG/ACT inhaler Inhale 2 puffs into the lungs 2 (two) times daily. 1 each 3   busPIRone  (BUSPAR ) 5 MG tablet Take 1 tablet (5 mg total) by mouth 2 (two) times daily. 180  tablet 2   Calcium  Carbonate-Vitamin D  (CALCIUM -D PO) Take 1,200 mg by mouth 2 (two) times daily.     cyclobenzaprine  (FLEXERIL ) 10 MG tablet Take 1 tablet by mouth three times daily as needed for muscle spasm 30 tablet 0   dicyclomine  (BENTYL ) 20 MG tablet Take 1 tablet (20 mg total) by mouth 3 (three) times daily as needed for spasms. 90 tablet 11   ECHINACEA-GOLDEN SEAL PO Take 1 tablet by mouth 2 (two) times daily as needed (immune system boost).     EPINEPHrine  0.3 mg/0.3 mL IJ SOAJ injection INJECT 0.3ML INTO THE MUSCLE ONCE AS DIRECTED 2 each 0   furosemide  (LASIX ) 20 MG tablet Take 1 tablet (20 mg total) by mouth daily. 30 tablet 3   guaiFENesin -codeine  100-10 MG/5ML syrup Take 5 mLs by mouth at bedtime as needed for cough. 60 mL 0   hydrochlorothiazide  (HYDRODIURIL ) 25 MG tablet Take 25 mg by mouth daily.     hydrOXYzine  (VISTARIL ) 25 MG capsule TAKE 1 CAPSULE BY MOUTH EVERY 8 HOURS AS NEEDED 90 capsule 0   lisinopril  (ZESTRIL ) 10 MG tablet Take 1 tablet by mouth once daily 90 tablet 1   Magnesium  250 MG TABS Take 500 mg by mouth at bedtime. Dosage varies     Melatonin 10 MG TABS Take 1 tablet by mouth at bedtime.     meloxicam  (MOBIC ) 7.5 MG tablet Take 1 tablet (7.5 mg total) by mouth daily. (Patient not taking: Reported on 02/03/2024) 30 tablet 0   Multiple Vitamin (MULTIVITAMIN WITH MINERALS) TABS tablet Take 1 tablet by mouth daily. With Elderberry     Multiple Vitamins-Minerals (ZINC  PO) Take 500 mg by mouth daily as needed (immune system boost).     nystatin  (MYCOSTATIN /NYSTOP ) powder Apply 1 Application topically 3 (three) times daily. 60 g 1   ondansetron  (ZOFRAN ) 4 MG tablet Take 1 tablet (4 mg total) by mouth every 8 (eight) hours as needed for nausea or vomiting. 30 tablet 1   pantoprazole  (PROTONIX ) 40 MG tablet Take 1 tablet (40 mg total) by mouth daily. 90 tablet 3   predniSONE  (DELTASONE ) 10 MG tablet Take 4 tablets ( total 40 mg) by mouth for 2 days; take 3 tablets ( total  30 mg) by mouth for 2 days; take 2 tablets ( total 20 mg) by mouth for 1 day; take 1 tablet ( total 10 mg) by mouth for 1 day. (Patient not taking: Reported on 02/03/2024) 17 tablet 0   propranolol  ER (INDERAL  LA) 80 MG 24 hr capsule Take 1 capsule (80 mg total) by mouth daily. 90 capsule 1   QULIPTA  60 MG TABS Take 1 tablet by mouth daily.     rosuvastatin  (CRESTOR ) 5 MG tablet Take 1 tablet (5 mg total) by mouth at bedtime. 90 tablet 0   sertraline  (ZOLOFT ) 100 MG tablet Take 1 tablet (100 mg total) by mouth  daily. 90 tablet 2   sertraline  (ZOLOFT ) 50 MG tablet Take 1 tablet by mouth once daily 90 tablet 0   traZODone  (DESYREL ) 100 MG tablet Take 1 tablet (100 mg total) by mouth at bedtime. (Patient not taking: Reported on 02/03/2024) 90 tablet 1   traZODone  (DESYREL ) 150 MG tablet Take 1 tablet (150 mg total) by mouth at bedtime. 90 tablet 1   Ubrogepant  (UBRELVY ) 100 MG TABS Take 1 tablet by mouth at onset of headache. May repeat dose x 1 after 2 hours if needed. Max 200 mg/ 24 hours. 30 tablet 1   Vitamin D , Ergocalciferol , (DRISDOL ) 1.25 MG (50000 UNIT) CAPS capsule 1 po q Wed and 1 po q Sun 8 capsule 0   vitamin k 100 MCG tablet Take 100 mcg by mouth daily.     No current facility-administered medications on file prior to visit.    ALLERGIES: Allergies  Allergen Reactions   Benzalkonium Chloride Anaphylaxis and Hives   Neosporin [Neomycin-Bacitracin  Zn-Polymyx] Anaphylaxis and Hives   Polysporin [Bacitracin -Polymyxin B ] Hives   Bee Venom Hives and Swelling    Throat swelling   Bupropion Hcl Other (See Comments)    angry   Cephalexin Hives and Swelling   Doxycycline  Itching    05/2023   Latex     FAMILY HISTORY: Family History  Problem Relation Age of Onset   Diabetes Mother    High blood pressure Mother    High Cholesterol Mother    Depression Mother    Anxiety disorder Mother    Sleep apnea Mother    Obesity Mother    Heart attack Father 62   High blood pressure Father     High Cholesterol Father    Heart disease Father    Heart attack Brother    Breast cancer Maternal Aunt 50   Ovarian cancer Neg Hx       Objective:  Blood pressure (!) 161/88, pulse (!) 105, height 5' 7 (1.702 m), weight (!) 382 lb (173.3 kg), last menstrual period 07/11/2016, SpO2 95%. General: No acute distress.  Patient appears well-groomed.     Juliene Dunnings, DO  CC: Chelsea Aurora, NP

## 2024-02-11 ENCOUNTER — Ambulatory Visit: Admitting: Neurology

## 2024-02-11 ENCOUNTER — Encounter: Payer: Self-pay | Admitting: Neurology

## 2024-02-11 ENCOUNTER — Other Ambulatory Visit: Payer: Self-pay | Admitting: Medical Genetics

## 2024-02-11 VITALS — BP 161/88 | HR 105 | Ht 67.0 in | Wt 382.0 lb

## 2024-02-11 DIAGNOSIS — G43E11 Chronic migraine with aura, intractable, with status migrainosus: Secondary | ICD-10-CM | POA: Diagnosis not present

## 2024-02-11 DIAGNOSIS — I10 Essential (primary) hypertension: Secondary | ICD-10-CM

## 2024-02-11 MED ORDER — ELETRIPTAN HYDROBROMIDE 40 MG PO TABS: 40.0000 mg | ORAL_TABLET | ORAL | 5 refills | Status: AC | PRN

## 2024-02-11 NOTE — Patient Instructions (Signed)
 PLAN TO CHANGE FROM QULIPTA  TO VYEPTI 300MG  INFUSION EVERY 3 MONTHS. FOR ACUTE MIGRAINES, TAKE UBRELVY  AND/OR ELETRIPTAN  AS DIRECTED Limit use of pain relievers to no more than 9 days out of the month to prevent risk of rebound or medication-overuse headache. KEEP HEADACHE DIARY FOLLOW UP 7 MONTHS (AFTER 2ND INFUSION)

## 2024-02-12 ENCOUNTER — Other Ambulatory Visit

## 2024-02-16 ENCOUNTER — Telehealth: Payer: Self-pay | Admitting: Pharmacy Technician

## 2024-02-16 NOTE — Telephone Encounter (Addendum)
 Dr. Skeet / Jesusa.  F/u:   Vyepti has been denied for 300mg  q3 months dosing. I have resubmitted the auth for 100mg  q61months? Would you like to use 100mg  q3 months?  Auth Submission: approved Site of care: Site of care: [facility] Payer: [employer] Medication & CPT/J Code(s) submitted: Vyepti (Eptinezumab) [id number] Diagnosis Code:  Route of submission (phone, fax, portal):  Phone # [phone] Fax # Auth type: Buy/Bill PB Units/visits requested: 100MG  Q3 MONTHS x 2 doses Reference number: [id number] Approval from:  02/16/24 - 08/14/24  Co-pay card: [employer] aware

## 2024-02-16 NOTE — Telephone Encounter (Signed)
 Vyepti has been approved for 100mg  q31months and the patient will be scheduled as soon as possilble.  Thanks

## 2024-02-18 ENCOUNTER — Other Ambulatory Visit
Admission: RE | Admit: 2024-02-18 | Discharge: 2024-02-18 | Disposition: A | Discharge status: Home / Self Care | Payer: Self-pay | Source: Ambulatory Visit | Attending: Medical Genetics | Admitting: Medical Genetics

## 2024-02-18 ENCOUNTER — Ambulatory Visit: Admitting: Psychiatry

## 2024-02-18 ENCOUNTER — Other Ambulatory Visit
Admission: RE | Admit: 2024-02-18 | Discharge: 2024-02-18 | Disposition: A | Discharge status: Home / Self Care | Attending: Psychiatry | Admitting: Psychiatry

## 2024-02-18 ENCOUNTER — Encounter: Payer: Self-pay | Admitting: Psychiatry

## 2024-02-18 ENCOUNTER — Other Ambulatory Visit: Payer: Self-pay

## 2024-02-18 VITALS — BP 112/77 | HR 90 | Temp 96.6°F | Ht 67.0 in | Wt 386.2 lb

## 2024-02-18 DIAGNOSIS — F331 Major depressive disorder, recurrent, moderate: Secondary | ICD-10-CM | POA: Insufficient documentation

## 2024-02-18 DIAGNOSIS — G4701 Insomnia due to medical condition: Secondary | ICD-10-CM | POA: Insufficient documentation

## 2024-02-18 DIAGNOSIS — Z79899 Other long term (current) drug therapy: Secondary | ICD-10-CM | POA: Insufficient documentation

## 2024-02-18 DIAGNOSIS — F411 Generalized anxiety disorder: Secondary | ICD-10-CM | POA: Insufficient documentation

## 2024-02-18 LAB — TSH: TSH: 2.701 u[IU]/mL (ref 0.350–4.500)

## 2024-02-18 MED ORDER — LAMOTRIGINE 25 MG PO TABS: ORAL_TABLET | ORAL | 0 refills | Status: DC

## 2024-02-18 NOTE — Progress Notes (Unsigned)
 Psychiatric Initial Adult Assessment   Patient Identification: Danielle Irwin Date of Evaluation:  02/18/2024 Referral Source: Ms.Kaur Charanpreet NP Chief Complaint:   Chief Complaint  Patient presents with   Establish Care   Anxiety   Depression   Insomnia   Visit Diagnosis:    ICD-10-CM   1. MDD (major depressive disorder), recurrent episode, moderate (HCC)  F33.1 lamoTRIgine  (LAMICTAL ) 25 MG tablet    2. GAD (generalized anxiety disorder)  F41.1 lamoTRIgine  (LAMICTAL ) 25 MG tablet    3. Insomnia due to medical condition  G47.01 TSH    Ambulatory referral to Pulmonology   Anxiety, depression , OSA not compliant on CPAP    4. High risk medication use  Z79.899 TSH      Discussed the use of AI scribe software for clinical note transcription with the patient, who gave verbal consent to proceed.  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  She reports a longstanding history of depression dating back to her teenage years, which has significantly worsened over the past year following the death of her father and her mother's declining health due to dementia. She describes persistent symptoms, including low energy, exhaustion, anhedonia, lack of motivation, and difficulty with activities of daily living. She reports minimal interest in leaving the house or engaging in activities, neglects personal hygiene, and eats only when hungry, often skipping meals,  which characterize her current functioning. She reports difficulty sleeping, with prolonged sleep onset due to racing thoughts and frequent awakenings after only 2 to 3 hours of sleep, which further impacts her well-being. Even with melatonin and trazodone , she continues to experience significant insomnia. She currently takes sertraline  150 mg daily, noting that her primary care provider increased her dose from 100 mg. She also takes buspirone  5 mg twice daily for anxiety and depression, and trazodone  at bedtime as needed for sleep.  She continues to struggle with depression symptoms in spite of taking these medications.  Chronic, pervasive anxiety manifests as constant worry about finances, her mother's health, her job, and daily responsibilities. She experiences difficulty concentrating, racing thoughts, and inability to complete tasks such as laundry due to preoccupation with stressors. She reports that anxiety and stress have contributed to her inability to function at work and at home. She identifies her current psychosocial stressors as the recent loss of her father, her mother's progressive dementia, anticipated job loss, and financial instability. 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  Her anxiety symptoms has been getting worse since the past several months.  Current medications has not been much beneficial.  She describes compulsive skin picking, particularly of scabs, which she identifies as a compulsion that results in scarring. She denies any history of psychosis, hallucinations, or paranoia. She reports occasional impulsive online shopping when she has extra money, but denies other symptoms of mania or hypomania. She uses food, particularly carbohydrates, as a source  of comfort during periods of emotional distress, but denies any  history of anorexia or bulimia.  She reports significant sleep disturbance, including difficulty initiating and maintaining sleep. She uses a CPAP machine inconsistently due to discomfort with the mask.   Denies any significant history of trauma.  Denies any suicidality, homicidality.      Associated Signs/Symptoms: Depression Symptoms:  depressed mood, anhedonia, insomnia, fatigue, difficulty concentrating, anxiety, decreased appetite, (Hypo) Manic Symptoms:  Denies Anxiety Symptoms:  Excessive Worry, Psychotic Symptoms:  Denies PTSD Symptoms: Negative  Past Psychiatric History: She denies any prior hospitalizations. She denies history of suicide attempts or self injurious behaviors. She has never participated in psychotherapy.  Previous Psychotropic Medications: Yes Wellbutrin-anger  Substance Abuse History in the last 12 months:  No. She reports cigarette smoking for a couple of years in her early twenties and no current tobacco use. She denies vaping, chewing tobacco, or snuff. She reports heavy alcohol use in her twenties, describing that period as drank like a fish, but currently drinks alcohol only once or twice a year, typically a glass of wine on special occasions. She denies current regular alcohol use. She denies any history of cannabis, cocaine, heroin, or other illicit drug use. She reports occasional use of kratom powder for lower back pain, with last use approximately 8 or 9 months ago, describing use as infrequent and not regular. She reports no history of substance use treatment, legal issues, or associated medical complications. Consequences of Substance Abuse: Negative  Past Medical History:  Past Medical History:  Diagnosis Date   Anemia    Anxiety    Asthma    Back pain    Depression    Edema of both lower extremities    Epstein Barr infection    Family history of adverse reaction to anesthesia    FATHER WAS SLOW TO WAKE UP   Fibromyalgia    GERD  (gastroesophageal reflux disease)    H/O blood clots    Headache    OCCASIONAL MIGRAINES   History of fibromyalgia    HNP (herniated nucleus pulposus), lumbar    Hx of epistaxis    Hypertension    Insomnia    Joint pain    OA (osteoarthritis)    Obesity    PCOS (polycystic ovarian syndrome)    Sensitive skin    Sleep apnea    Vitamin D  deficiency     Past Surgical History:  Procedure Laterality Date   BREAST BIOPSY Left 09/17/2022   Stereo bx, Ribbon Clip, path pending   BREAST BIOPSY Left 09/17/2022   MM LT BREAST BX W LOC DEV 1ST LESION IMAGE BX SPEC STEREO GUIDE 09/17/2022 [facility]   COLONOSCOPY WITH PROPOFOL  N/A 09/09/2022   Procedure: COLONOSCOPY WITH PROPOFOL ;  Surgeon: Therisa Bi, MD;  Location: [facility];  Service: Gastroenterology;  Laterality: N/A;   LUMBAR LAMINECTOMY/DECOMPRESSION MICRODISCECTOMY Right 06/02/2014   Procedure: MICRO LUMBAR DECOMPRESSION L4 - L5 ON THE RIGHT 1 LEVEL;  Surgeon: Reyes JAYSON Billing, MD;  Location: [facility];  Service: Orthopedics;  Laterality: Right;   ORIF HUMERUS FRACTURE Left 08/01/2017   Procedure: OPEN REDUCTION INTERNAL FIXATION (ORIF) PROXIMAL HUMERUS FRACTURE;  Surgeon: Kendal Franky SQUIBB, MD;  Location: [facility] OR;  Service: Orthopedics;  Laterality: Left;   WISDOM TOOTH EXTRACTION      Family Psychiatric History: As noted below.  Family History:  Family History  Problem Relation Age of Onset   Diabetes Mother    High blood pressure Mother  High Cholesterol Mother    Depression Mother    Anxiety disorder Mother    Sleep apnea Mother    Obesity Mother    Depression Father    Heart attack Father 69   High blood pressure Father    High Cholesterol Father    Heart disease Father    Heart attack Brother    Depression Maternal Aunt    Breast cancer Maternal Aunt 60   Depression Maternal Grandmother    Ovarian cancer Neg Hx     Social History:   Social History   Socioeconomic History   Marital status: Single     Spouse name: Not on file   Number of children: 0   Years of education: Not on file   Highest education level: Some college, no degree  Occupational History   Occupation: debt collections  Tobacco Use   Smoking status: Former    Current packs/day: 0.00    Types: Cigarettes    Quit date: 05/26/1990    Years since quitting: 33.7   Smokeless tobacco: Never  Vaping Use   Vaping status: Never Used  Substance and Sexual Activity   Alcohol use: Yes    Comment: social   Drug use: No   Sexual activity: Not Currently    Partners: Male    Birth control/protection: Post-menopausal  Other Topics Concern   Not on file  Social History Narrative   Not on file   Social Drivers of Health   Financial Resource Strain: High Risk (01/05/2024)   Overall Financial Resource Strain (CARDIA)    Difficulty of Paying Living Expenses: Hard  Food Insecurity: Food Insecurity Present (01/05/2024)   Hunger Vital Sign    Worried About Running Out of Food in the Last Year: Sometimes true    Ran Out of Food in the Last Year: Sometimes true  Transportation Needs: No Transportation Needs (01/05/2024)   PRAPARE - Administrator, Civil Service (Medical): No    Lack of Transportation (Non-Medical): No  Physical Activity: Inactive (01/05/2024)   Exercise Vital Sign    Days of Exercise per Week: 0 days    Minutes of Exercise per Session: Not on file  Stress: Stress Concern Present (01/05/2024)   [employer] of Occupational Health - Occupational Stress Questionnaire    Feeling of Stress: Very much  Social Connections: Socially Isolated (01/05/2024)   Social Connection and Isolation Panel    Frequency of Communication with Friends and Family: Never    Frequency of Social Gatherings with Friends and Family: Never    Attends Religious Services: 1 to 4 times per year    Active Member of [employer] or Organizations: No    Attends Engineer, structural: Not on file    Marital Status: Never married     Additional Social History: Born in [place] .  She lived in [place] as well as in [place] growing up.  Raised by both parents.  Had a normal childhood.  She completed some college but did not graduate, citing indecision about career path and financial difficulties. She is currently employed at a Nurse, mental health but has not worked since March and anticipates job loss. She previously worked at a bank. She lives with her mother in [place], having moved back in 3 years ago to provide care. She has never married, has no children, and reports no relationships lasting more than 6 months. She has her brother and his family, who live locally. She identifies as more  spiritual than religious, was raised in the church, and has studied various religions. She enjoys reading and describes herself as a constant reader. She denies any history of legal problems or military service.  Denies access to a gun. Allergies:   Allergies  Allergen Reactions   Benzalkonium Chloride Anaphylaxis and Hives   Neosporin [Neomycin-Bacitracin  Zn-Polymyx] Anaphylaxis and Hives   Polysporin [Bacitracin -Polymyxin B ] Hives   Bee Venom Hives and Swelling    Throat swelling   Bupropion Hcl Other (See Comments)    angry   Cephalexin Hives and Swelling   Doxycycline  Itching    05/2023   Latex     Metabolic Disorder Labs: Lab Results  Component Value Date   HGBA1C 5.7 (H) 02/11/2023   No results found for: PROLACTIN Lab Results  Component Value Date   CHOL 245 (H) 02/11/2023   TRIG 138 02/11/2023   HDL 50 02/11/2023   CHOLHDL 5 07/18/2022   VLDL 18.6 07/18/2022   LDLCALC 170 (H) 02/11/2023   LDLCALC 159 (H) 07/18/2022   Lab Results  Component Value Date   TSH 2.85 03/28/2023    Therapeutic Level Labs: No results found for: LITHIUM No results found for: CBMZ No results found for: VALPROATE  Current Medications: Current Outpatient Medications  Medication Sig Dispense  Refill   acetaminophen  (TYLENOL ) 650 MG CR tablet Take 650 mg by mouth every 8 (eight) hours as needed for pain.     albuterol  (PROVENTIL ) (2.5 MG/3ML) 0.083% nebulizer solution Take 3 mLs (2.5 mg total) by nebulization every 6 (six) hours as needed for wheezing or shortness of breath. 150 mL 1   albuterol  (VENTOLIN  HFA) 108 (90 Base) MCG/ACT inhaler Inhale 2 puffs into the lungs every 6 (six) hours as needed for wheezing or shortness of breath. 8 g 2   Alum & Mag Hydroxide-Simeth (ANTACID ANTI-GAS PO) Take 1 tablet by mouth daily as needed (for gas).     Ascorbic Acid  (VITAMIN C ) 1000 MG tablet Take 1,000 mg by mouth 2 (two) times daily.     aspirin  EC 81 MG tablet Take 81 mg by mouth daily. Swallow whole.     aspirin -acetaminophen -caffeine (EXCEDRIN MIGRAINE) 250-250-65 MG tablet Take 2 tablets by mouth 2 (two) times daily as needed for headache or migraine.     b complex vitamins tablet Take 1 tablet by mouth daily.     budesonide -formoterol  (SYMBICORT ) 80-4.5 MCG/ACT inhaler Inhale 2 puffs into the lungs 2 (two) times daily. 1 each 3   busPIRone  (BUSPAR ) 5 MG tablet Take 1 tablet (5 mg total) by mouth 2 (two) times daily. 180 tablet 2   Calcium  Carbonate-Vitamin D  (CALCIUM -D PO) Take 1,200 mg by mouth 2 (two) times daily.     cyclobenzaprine  (FLEXERIL ) 10 MG tablet Take 1 tablet by mouth three times daily as needed for muscle spasm 30 tablet 0   dicyclomine  (BENTYL ) 20 MG tablet Take 1 tablet (20 mg total) by mouth 3 (three) times daily as needed for spasms. 90 tablet 11   ECHINACEA-GOLDEN SEAL PO Take 1 tablet by mouth 2 (two) times daily as needed (immune system boost).     eletriptan  (RELPAX ) 40 MG tablet Take 1 tablet (40 mg total) by mouth as needed for migraine or headache. May repeat in 2 hours if headache persists or recurs.  Maximum 2 tablets in 24 hours. 10 tablet 5   EPINEPHrine  0.3 mg/0.3 mL IJ SOAJ injection INJECT 0.3ML INTO THE MUSCLE ONCE AS DIRECTED 2 each 0  guaiFENesin -codeine  100-10 MG/5ML syrup Take 5 mLs by mouth at bedtime as needed for cough. 60 mL 0   hydrochlorothiazide  (HYDRODIURIL ) 25 MG tablet Take 25 mg by mouth daily.     hydrOXYzine  (VISTARIL ) 25 MG capsule TAKE 1 CAPSULE BY MOUTH EVERY 8 HOURS AS NEEDED (Patient taking differently: 25 mg every 8 (eight) hours as needed.) 90 capsule 0   lamoTRIgine  (LAMICTAL ) 25 MG tablet Take 1 tablet (25 mg total) by mouth daily for 15 days, THEN 1 tablet (25 mg total) 2 (two) times daily for 15 days. 45 tablet 0   lisinopril  (ZESTRIL ) 10 MG tablet Take 1 tablet by mouth once daily 90 tablet 1   Magnesium  250 MG TABS Take 500 mg by mouth at bedtime. Dosage varies     Melatonin 10 MG TABS Take 1 tablet by mouth at bedtime.     Multiple Vitamin (MULTIVITAMIN WITH MINERALS) TABS tablet Take 1 tablet by mouth daily. With Elderberry     nystatin  (MYCOSTATIN /NYSTOP ) powder Apply 1 Application topically 3 (three) times daily. 60 g 1   ondansetron  (ZOFRAN ) 4 MG tablet Take 1 tablet (4 mg total) by mouth every 8 (eight) hours as needed for nausea or vomiting. 30 tablet 1   pantoprazole  (PROTONIX ) 40 MG tablet Take 1 tablet (40 mg total) by mouth daily. 90 tablet 3   propranolol  ER (INDERAL  LA) 80 MG 24 hr capsule Take 1 capsule (80 mg total) by mouth daily. 90 capsule 1   QULIPTA  60 MG TABS Take 1 tablet by mouth daily.     rosuvastatin  (CRESTOR ) 5 MG tablet Take 1 tablet (5 mg total) by mouth at bedtime. 90 tablet 0   sertraline  (ZOLOFT ) 100 MG tablet Take 1 tablet (100 mg total) by mouth daily. 90 tablet 2   sertraline  (ZOLOFT ) 50 MG tablet Take 1 tablet by mouth once daily 90 tablet 0   traZODone  (DESYREL ) 150 MG tablet Take 1 tablet (150 mg total) by mouth at bedtime. 90 tablet 1   Ubrogepant  (UBRELVY ) 100 MG TABS Take 1 tablet by mouth at onset of headache. May repeat dose x 1 after 2 hours if needed. Max 200 mg/ 24 hours. 30 tablet 1   Vitamin D , Ergocalciferol , (DRISDOL ) 1.25 MG (50000 UNIT) CAPS capsule  1 po q Wed and 1 po q Sun (Patient taking differently: No sig reported) 8 capsule 0   vitamin k 100 MCG tablet Take 100 mcg by mouth daily.     amoxicillin -clavulanate (AUGMENTIN ) 875-125 MG tablet Take 1 tablet by mouth 2 (two) times daily. (Patient not taking: Reported on 02/18/2024) 14 tablet 0   bismuth  subsalicylate (PEPTO-BISMOL) 262 MG/15ML suspension Take 30 mLs by mouth 4 (four) times daily -  before meals and at bedtime. (Patient not taking: Reported on 02/18/2024) 360 mL 0   furosemide  (LASIX ) 20 MG tablet Take 1 tablet (20 mg total) by mouth daily. (Patient taking differently: Take 20 mg by mouth daily. Only on the weekends) 30 tablet 3   Multiple Vitamins-Minerals (ZINC  PO) Take 500 mg by mouth daily as needed (immune system boost). (Patient not taking: Reported on 02/18/2024)     No current facility-administered medications for this visit.    Musculoskeletal: Strength & Muscle Tone: within normal limits Gait & Station: Walks with a cane Patient leans: N/A  Psychiatric Specialty Exam: Review of Systems  Psychiatric/Behavioral:  Positive for decreased concentration, dysphoric mood and sleep disturbance. The patient is nervous/anxious.     Blood pressure 112/77, pulse 90,  temperature (!) 96.6 F (35.9 C), temperature source Temporal, height 5' 7 (1.702 m), weight (!) 386 lb 3.2 oz (175.2 kg), last menstrual period 07/11/2016.Body mass index is 60.49 kg/m.  General Appearance: Casual  Eye Contact:  Fair  Speech:  Clear and Coherent  Volume:  Normal  Mood:  Anxious and Depressed  Affect:  Congruent  Thought Process:  Goal Directed and Descriptions of Associations: Intact  Orientation:  Full (Time, Place, and Person)  Thought Content:  Logical  Suicidal Thoughts:  No  Homicidal Thoughts:  No  Memory:  Immediate;   Fair Recent;   Fair Remote;   Fair  Judgement:  Fair  Insight:  Fair  Psychomotor Activity:  Normal  Concentration:  Concentration: Fair and Attention Span:  Fair  Recall:  [employer] of Knowledge:Fair  Language: Fair  Akathisia:  No  Handed:  Right  AIMS (if indicated):  not done  Assets:  Communication Skills Desire for Improvement Housing Social Support Talents/Skills Transportation  ADL's:  Intact  Cognition: WNL  Sleep:  Poor   Screenings: GAD-7    Flowsheet Row Office Visit from 02/18/2024 in [facility] Office Visit from 01/06/2024 in [facility] [employer] Visit from 10/10/2023 in [facility] [employer] Visit from 09/25/2023 in [facility] [employer] Visit from 07/11/2023 in [facility] [employer]  Total GAD-7 Score 20 20 16 20 15    PHQ2-9    Flowsheet Row Office Visit from 02/18/2024 in [facility] Office Visit from 01/06/2024 in [facility] Visit from 10/10/2023 in [facility] [employer] Visit from 09/25/2023 in [facility] [employer] Visit from 07/11/2023 in [facility] [employer] Total Score 6 6 6 6 5   PHQ-9 Total Score 23 23 21 23 19    Flowsheet Row Office Visit from 02/18/2024 in [facility] Admission (Discharged) from 09/09/2022 in [facility] from 08/22/2022 in [facility]   C-SSRS RISK CATEGORY No Risk No Risk No Risk    Assessment and Plan: Danielle Irwin is a 53 year old Caucasian female who has a history of depression, anxiety, chronic migraine, morbid obesity, multiple medical problems was evaluated in office today presented to establish care.  Discussed assessment and plan as noted below.   Assessment & Plan Major depressive disorder -unstable Generalized  anxiety disorder  -unstable Chronic major depressive disorder has worsened over the past year due to familial stressors and recent bereavement, presenting with fatigue, anhedonia, poor sleep, and poor appetite. Generalized anxiety disorder is marked by constant worry, racing thoughts, and difficulty concentrating. She is currently on sertraline  150 mg daily and Buspar  5 mg twice daily. Concerns about weight gain with new medications were discussed. Lamictal  is considered as an adjunct to sertraline , with caution due to potential rash. Continue sertraline  150 mg daily. Continue BuSpar  5 mg twice daily Add Lamictal  25 mg daily for 15 days and increase to 25 mg twice daily after that. Refer to therapist Ellouise Hummer for psychotherapy.   Insomnia-unstable (history of obstructive sleep apnea ) Obstructive sleep apnea is present with non-compliance to CPAP therapy due to mask discomfort, contributing to fatigue, poor concentration, and increased risk for cardiovascular and metabolic conditions. The last sleep  study was conducted years ago. Discussed risks of untreated sleep apnea, including increased risk for heart attacks, stroke, dementia, and diabetes mellitus.  Ambulatory referral to pulmonologist. Continue trazodone  150 mg at bedtime as needed. Continue melatonin 10 mg at bedtime as needed.  High risk medication use-will order TSH.  Patient to get this completed at the [facility] lab.  I have reviewed notes per Ms.Kaur dated 01/06/2024-patient at that visit was continued on sertraline , trazodone  and BuSpar .  Follow-up Follow-up in clinic in 4 weeks or sooner if needed.  Collaboration of Care: Referral or follow-up with counselor/therapist AEB encouraged to establish care with therapist, provided resources.  Patient/Guardian was advised Release of Information must be obtained prior to any record release in order to collaborate their care with an outside provider. Patient/Guardian was advised if they  have not already done so to contact the registration department to sign all necessary forms in order for us  to release information regarding their care.   Consent: Patient/Guardian gives verbal consent for treatment and assignment of benefits for services provided during this visit. Patient/Guardian expressed understanding and agreed to proceed.  This note was generated in part or whole with voice recognition software. Voice recognition is usually quite accurate but there are transcription errors that can and very often do occur. I apologize for any typographical errors that were not detected and corrected.    Wilmar Prabhakar, MD 9/10/202512:55 PM

## 2024-02-18 NOTE — Patient Instructions (Addendum)
 Danielle Irwin - tel:[phone]   Lamotrigine  Tablets What is this medication? LAMOTRIGINE  (la MOE gunnar bourgeois) prevents and controls seizures in people with epilepsy. It may also be used to treat bipolar disorder. It works by calming overactive nerves in your body. This medicine may be used for other purposes; ask your health care provider or pharmacist if you have questions. COMMON BRAND NAME(S): Lamictal , Subvenite  What should I tell my care team before I take this medication? They need to know if you have any of these conditions: Heart disease History of irregular heartbeat Immune system problems Kidney disease Liver disease Low levels of folic acid in the blood Lupus Mental health condition Suicidal thoughts, plans, or attempt by you or a family member An unusual or allergic reaction to lamotrigine , other medications, foods, dyes, or preservatives Pregnant or trying to get pregnant Breastfeeding How should I use this medication? Take this medication by mouth with a glass of water. Follow the directions on the prescription label. Do not chew these tablets. If this medication upsets your stomach, take it with food or milk. Take your doses at regular intervals. Do not take your medication more often than directed. A special MedGuide will be given to you by the pharmacist with each new prescription and refill. Be sure to read this information carefully each time. Talk to your care team about the use of this medication in children. While this medication may be prescribed for children as young as 2 years for selected conditions, precautions do apply. Overdosage: If you think you have taken too much of this medicine contact a poison control center or emergency room at once. NOTE: This medicine is only for you. Do not share this medicine with others. What if I miss a dose? If you miss a dose, take it as soon as you can. If it is almost time for your next dose, take only that dose. Do  not take double or extra doses. What may interact with this medication? Atazanavir Certain medications for irregular heartbeat Certain medications for seizures, such as carbamazepine, phenobarbital, phenytoin, primidone, or valproic acid Estrogen or progestin hormones Lopinavir Rifampin Ritonavir This list may not describe all possible interactions. Give your health care provider a list of all the medicines, herbs, non-prescription drugs, or dietary supplements you use. Also tell them if you smoke, drink alcohol, or use illegal drugs. Some items may interact with your medicine. What should I watch for while using this medication? Visit your care team for regular checks on your progress. If you take this medication for seizures, wear a Medic Alert bracelet or necklace. Carry an identification card with information about your condition, medications, and care team. It is important to take this medication exactly as directed. When first starting treatment, your dose will need to be adjusted slowly. It may take weeks or months before your dose is stable. You should contact your care team if your seizures get worse or if you have any new types of seizures. Do not stop taking this medication unless instructed by your care team. Stopping your medication suddenly can increase your seizures or their severity. This medication may cause serious skin reactions. They can happen weeks to months after starting the medication. Contact your care team right away if you notice fevers or flu-like symptoms with a rash. The rash may be red or purple and then turn into blisters or peeling of the skin. You may also notice a red rash with swelling of the face, lips, or lymph  nodes in your neck or under your arms. This medication may affect your coordination, reaction time, or judgment. Do not drive or operate machinery until you know how this medication affects you. Sit up or stand slowly to reduce the risk of dizzy or fainting  spells. Drinking alcohol with this medication can increase the risk of these side effects. If you are taking this medication for bipolar disorder, it is important to report any changes in your mood to your care team. If your condition gets worse, you get mentally depressed, feel very hyperactive or manic, have difficulty sleeping, or have thoughts of hurting yourself or committing suicide, you need to get help from your care team right away. If you are a caregiver for someone taking this medication for bipolar disorder, you should also report these behavioral changes right away. The use of this medication may increase the chance of suicidal thoughts or actions. Pay special attention to how you are responding while on this medication. Your mouth may get dry. Chewing sugarless gum or sucking hard candy and drinking plenty of water may help. Contact your care team if the problem does not go away or is severe. If you become pregnant while using this medication, you may enroll in the [place] American Antiepileptic Drug Pregnancy Registry by calling [phone]. This registry collects information about the safety of antiepileptic medication use during pregnancy. This medication may cause a decrease in folic acid. You should make sure that you get enough folic acid while you are taking this medication. Discuss the foods you eat and the vitamins you take with your care team. What side effects may I notice from receiving this medication? Side effects that you should report to your care team as soon as possible: Allergic reactions--skin rash, itching, hives, swelling of the face, lips, tongue, or throat Change in vision Fever, neck pain or stiffness, sensitivity to light, headache, nausea, vomiting, confusion, which may be signs of meningitis Fever, rash, swollen lymph nodes, confusion, trouble walking, loss of balance or coordination, seizures Heart rhythm changes--fast or irregular heartbeat, dizziness, feeling  faint or lightheaded, chest pain, trouble breathing Infection--fever, chills, cough, or sore throat Low red blood cell level--unusual weakness or fatigue, dizziness, headache, trouble breathing Rash, fever, and swollen lymph nodes Redness, blistering, peeling, or loosening of the skin, including inside the mouth Thoughts of suicide or self-harm, worsening mood, feelings of depression Unusual bruising or bleeding Side effects that usually do not require medical attention (report these to your care team if they continue or are bothersome): Diarrhea Dizziness Drowsiness Headache Nausea Stomach pain Tremors or shaking This list may not describe all possible side effects. Call your doctor for medical advice about side effects. You may report side effects to FDA at 1-800-FDA-1088. Where should I keep my medication? Keep out of the reach of children and pets. Store at [employer] C (77 degrees F). Protect from light. Get rid of any unused medication after the expiration date. To get rid of medications that are no longer needed or have expired: Take the medication to a medication take-back program. Check with your pharmacy or law enforcement to find a location. If you cannot return the medication, check the label or package insert to see if the medication should be thrown out in the garbage or flushed down the toilet. If you are not sure, ask your care team. If it is safe to put it in the trash, empty the medication out of the container. Mix the medication with cat litter,  dirt, coffee grounds, or other unwanted substance. Seal the mixture in a bag or container. Put it in the trash. NOTE: This sheet is a summary. It may not cover all possible information. If you have questions about this medicine, talk to your doctor, pharmacist, or health care provider.  2024 Elsevier/Gold Standard (2023-05-09 00:00:00)

## 2024-02-19 ENCOUNTER — Encounter: Payer: Self-pay | Admitting: Internal Medicine

## 2024-02-19 ENCOUNTER — Ambulatory Visit (INDEPENDENT_AMBULATORY_CARE_PROVIDER_SITE_OTHER): Admitting: Internal Medicine

## 2024-02-19 VITALS — BP 120/70 | HR 84 | Temp 98.2°F | Ht 67.0 in | Wt 383.2 lb

## 2024-02-19 DIAGNOSIS — J4520 Mild intermittent asthma, uncomplicated: Secondary | ICD-10-CM

## 2024-02-19 DIAGNOSIS — G4710 Hypersomnia, unspecified: Secondary | ICD-10-CM

## 2024-02-19 DIAGNOSIS — Z87891 Personal history of nicotine dependence: Secondary | ICD-10-CM

## 2024-02-19 DIAGNOSIS — I10 Essential (primary) hypertension: Secondary | ICD-10-CM

## 2024-02-19 DIAGNOSIS — G4733 Obstructive sleep apnea (adult) (pediatric): Secondary | ICD-10-CM

## 2024-02-19 DIAGNOSIS — Z6841 Body Mass Index (BMI) 40.0 and over, adult: Secondary | ICD-10-CM

## 2024-02-19 NOTE — Assessment & Plan Note (Signed)
 No exacerbation at this time Well-controlled Continue inhalers as prescribed Avoid Allergens and Irritants Avoid secondhand smoke Avoid SICK contacts Recommend  Masking  when appropriate Recommend Keep up-to-date with vaccinations

## 2024-02-19 NOTE — Progress Notes (Signed)
 Name: Danielle Irwin MRN: [id number] DOB: Nov 21, 1970    CHIEF COMPLAINT:  ASSESSMENT OF SLEEP APNEA EXCESSIVE DAYTIME SLEEPINESS Assessment of asthma   HISTORY OF PRESENT ILLNESS: Patient is seen today for problems and issues with sleep related to excessive daytime sleepiness Patient  has been having sleep problems for many years Patient has been having excessive daytime sleepiness for a long time Patient has been having extreme fatigue and tiredness, lack of energy  Patient previously diagnosed with sleep apnea back in 2018 Patient started CPAP therapy for 3 years Then in 2022 she had shoulder surgery which impeded her sleep position which altered her mask  Patient has a history of insomnia she takes 10 mg of melatonin She also takes 100 mg of trazodone  and still has issues with insomnia  Patient previously smoker but currently non-smoker Nonalcoholic Work as a Engineer, 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  Encouraged proper weight management.  Discussed driving precautions and its relationship with hypersomnolence.  Discussed operating dangerous equipment and its relationship with hypersomnolence.  Discussed sleep hygiene, and benefits of a fixed sleep waked time.  The importance of getting eight or more hours of sleep discussed with patient.  Discussed limiting the use of the computer and television before bedtime.  Decrease naps during the day, so night time sleep will become enhanced.  Limit caffeine, and sleep deprivation.  HTN, stroke, and heart failure are  potential risk factors.       02/19/2024   10:00 AM  Results of the Epworth flowsheet  Sitting and reading 2  Watching TV 2  Sitting, inactive in a public place (e.g. a theatre or a meeting) 0  As a passenger in a car for an hour without a break 1  Lying down to rest in the afternoon when circumstances permit 2  Sitting and talking to someone 0  Sitting quietly after a lunch without alcohol 2  In a car, while stopped for a few minutes in traffic 0  Total score 9      PAST MEDICAL HISTORY :   has a past medical history of Anemia, Anxiety, Asthma, Back pain, Depression, Edema of both lower extremities, Epstein Barr infection, Family history of adverse reaction to anesthesia, Fibromyalgia, GERD (gastroesophageal reflux disease), H/O blood clots, Headache, Heart murmur (Unsure), History of fibromyalgia, HNP (herniated nucleus pulposus), lumbar, epistaxis, Hypertension, Insomnia, Joint pain, OA (osteoarthritis), Obesity, PCOS (polycystic ovarian syndrome), Sensitive skin, Sleep apnea, and Vitamin D  deficiency.  has a past surgical history that includes Wisdom tooth extraction; Lumbar laminectomy/decompression microdiscectomy (Right, 06/02/2014); ORIF humerus fracture (Left, 08/01/2017); Colonoscopy with propofol  (N/A, 09/09/2022); Breast biopsy (Left, 09/17/2022); and Breast biopsy (Left, 09/17/2022). Prior to Admission medications   Medication Sig Start Date End Date Taking? Authorizing Provider  acetaminophen  (TYLENOL ) 650 MG CR tablet Take 650 mg by mouth every 8 (eight) hours as needed for pain.   Yes [provider]  albuterol  (PROVENTIL ) (2.5 MG/3ML) 0.083% nebulizer solution Take 3 mLs (2.5 mg total) by nebulization every 6 (six) hours as needed for wheezing or shortness of breath. 06/10/23  Yes Dineen Rollene MATSU, FNP  albuterol  (VENTOLIN  HFA) 108 (90 Base) MCG/ACT inhaler Inhale 2 puffs into the lungs every 6 (six) hours as needed for wheezing or shortness of breath. 07/18/22  Yes  Kaur, Charanpreet, NP  Alum & Mag Hydroxide-Simeth (ANTACID ANTI-GAS PO) Take 1 tablet by mouth daily as needed (for gas).   Yes [provider]  Ascorbic Acid  (VITAMIN C ) 1000 MG tablet Take 1,000 mg by mouth 2 (two) times daily.   Yes [provider]  aspirin  EC 81 MG tablet Take 81 mg by mouth daily. Swallow whole.   Yes [provider]  aspirin -acetaminophen -caffeine (EXCEDRIN MIGRAINE) 250-250-65 MG tablet Take 2 tablets by mouth 2 (two) times daily as needed for headache or migraine.   Yes [provider]  b complex vitamins tablet Take 1 tablet by mouth daily.   Yes [provider]  budesonide -formoterol  (SYMBICORT ) 80-4.5 MCG/ACT inhaler Inhale 2 puffs into the lungs 2 (two) times daily. 07/30/22  Yes Arnett, Rollene MATSU, FNP  busPIRone  (BUSPAR ) 5 MG tablet Take 1 tablet (5 mg total) by mouth 2 (two) times daily. 09/25/23  Yes Kaur, Charanpreet, NP  Calcium  Carbonate-Vitamin D  (CALCIUM -D PO) Take 1,200 mg by mouth 2 (two) times daily.   Yes [provider]  cyclobenzaprine  (FLEXERIL ) 10 MG tablet Take 1 tablet by mouth three times daily as needed for muscle spasm 02/06/24  Yes Kaur, Charanpreet, NP  dicyclomine  (BENTYL ) 20 MG tablet Take 1 tablet (20 mg total) by mouth 3 (three) times daily as needed for spasms. 08/28/23 08/22/24 Yes [place], PA-C  ECHINACEA-GOLDEN SEAL PO Take 1 tablet by mouth 2 (two) times daily as needed (immune system boost).   Yes [provider]  eletriptan  (RELPAX ) 40 MG tablet Take 1 tablet (40 mg total) by mouth as needed for migraine or headache. May repeat in 2 hours if headache persists or recurs.  Maximum 2 tablets in 24 hours. 02/11/24  Yes Jaffe, Adam R, DO  EPINEPHrine  0.3 mg/0.3 mL IJ SOAJ injection INJECT 0.3ML INTO THE MUSCLE ONCE AS DIRECTED 01/06/24  Yes Vincente Saber, NP  furosemide  (LASIX ) 20 MG tablet Take 1 tablet (20 mg total) by mouth daily. Patient taking differently: Take 20 mg by  mouth daily. Only on the weekends 12/05/20  Yes Masoud, Sheralyn, MD  guaiFENesin -codeine  100-10 MG/5ML syrup Take 5 mLs by mouth at bedtime as needed for cough. 06/10/23  Yes ArnettRollene MATSU, FNP  hydrochlorothiazide  (HYDRODIURIL ) 25 MG tablet Take 25 mg by mouth daily.   Yes [provider]  hydrOXYzine  (VISTARIL ) 25 MG capsule TAKE 1 CAPSULE BY MOUTH EVERY 8 HOURS AS NEEDED Patient taking differently: 25 mg every 8 (eight) hours as needed. 02/06/24  Yes Kaur, Charanpreet, NP  lamoTRIgine  (LAMICTAL ) 25 MG tablet Take 1 tablet (25 mg total) by mouth daily for 15 days, THEN 1 tablet (25 mg total) 2 (two) times daily for 15 days. 02/18/24 03/19/24 Yes Eappen, Saramma, MD  lisinopril  (ZESTRIL ) 10 MG tablet Take 1 tablet by mouth once daily 08/29/23  Yes Kaur, Charanpreet, NP  Magnesium  250 MG TABS Take 500 mg by mouth at bedtime. Dosage varies   Yes [provider]  Melatonin 10 MG TABS Take 1 tablet by mouth at bedtime.   Yes [provider]  Multiple Vitamin (MULTIVITAMIN WITH MINERALS) TABS tablet Take 1 tablet by mouth daily. With [facility]   Yes [provider]  nystatin  (MYCOSTATIN /NYSTOP ) powder Apply 1 Application topically 3 (three) times daily. 03/10/23  Yes Lester, Kacy, NP  ondansetron  (ZOFRAN ) 4 MG tablet Take 1 tablet (4 mg total) by mouth every 8 (eight) hours as needed for nausea or vomiting. 01/06/24  Yes Kaur, Charanpreet, NP  pantoprazole  (PROTONIX ) 40 MG tablet Take 1 tablet (40 mg total) by mouth daily. 08/28/23 08/22/24 Yes [place], PA-C  propranolol  ER (INDERAL  LA) 80 MG 24 hr capsule Take 1 capsule (80 mg total) by mouth daily. 05/06/23  Yes Gretel App, NP  QULIPTA  60 MG TABS Take 1 tablet by mouth daily. 11/28/23  Yes [provider]  rosuvastatin  (CRESTOR ) 5 MG tablet Take 1 tablet (5 mg total) by mouth at bedtime. 02/25/23  Yes Opalski, Barnie, DO  sertraline  (ZOLOFT ) 100 MG tablet Take 1 tablet (100 mg total) by mouth daily.  01/07/23  Yes Kaur, Charanpreet, NP  sertraline  (ZOLOFT ) 50 MG tablet Take 1 tablet by mouth once daily 02/06/24  Yes Kaur, Charanpreet, NP  traZODone  (DESYREL ) 150 MG tablet Take 1 tablet (150 mg total) by mouth at bedtime. 09/25/23  Yes Kaur, Charanpreet, NP  Ubrogepant  (UBRELVY ) 100 MG TABS Take 1 tablet by mouth at onset of headache. May repeat dose x 1 after 2 hours if needed. Max 200 mg/ 24 hours. 05/27/23  Yes Kaur, Charanpreet, NP  Vitamin D , Ergocalciferol , (DRISDOL ) 1.25 MG (50000 UNIT) CAPS capsule 1 po q Wed and 1 po q Sun Patient taking differently: No sig reported 04/08/23  Yes Opalski, Deborah, DO  vitamin k 100 MCG tablet Take 100 mcg by mouth daily.   Yes [provider]  amoxicillin -clavulanate (AUGMENTIN ) 875-125 MG tablet Take 1 tablet by mouth 2 (two) times daily. Patient not taking: Reported on 02/19/2024 02/06/24   Vivienne Delon HERO, PA-C  bismuth  subsalicylate (PEPTO-BISMOL) 262 MG/15ML suspension Take 30 mLs by mouth 4 (four) times daily -  before meals and at bedtime. Patient not taking: Reported on 02/19/2024 08/27/18   Nche, Roselie Rockford, NP  Multiple Vitamins-Minerals (ZINC  PO) Take 500 mg by mouth daily as needed (immune system boost). Patient not taking: Reported on 02/19/2024    [provider]   Allergies  Allergen Reactions   Benzalkonium Chloride Anaphylaxis and Hives   Neosporin [Neomycin-Bacitracin  Zn-Polymyx] Anaphylaxis and Hives   Polysporin [Bacitracin -Polymyxin B ] Hives   Bee Venom Hives and Swelling    Throat swelling   Bupropion Hcl Other (See Comments)    angry   Cephalexin Hives and Swelling   Doxycycline  Itching    05/2023   Latex     FAMILY HISTORY:  family history includes Anxiety disorder in her mother; Breast cancer (age of onset: 71) in her maternal aunt; Depression in her father, maternal aunt, maternal grandmother, and mother; Diabetes in her mother; Heart attack in her brother and brother; Heart attack (age of onset:  3) in her father; Heart disease in her father; High Cholesterol in her father and mother; High blood pressure in her father and mother; Obesity in her mother; Sleep apnea in her mother. SOCIAL HISTORY:  reports that she quit smoking about 33 years ago. Her smoking use included cigarettes. She has never used smokeless tobacco. She reports current alcohol use. She reports that she does not use drugs.   BP 120/70 Comment: on right forearm  Pulse 84   Temp 98.2 F (36.8 C)   Ht 5' 7 (1.702 m)   Wt (!) 383 lb 3.2 oz (173.8 kg)   LMP 07/11/2016   SpO2 95%   BMI 60.02 kg/m      Review of Systems: Gen:  Denies  fever, sweats, chills weight loss  HEENT: Denies blurred vision, double vision, ear pain, eye pain, hearing loss, nose bleeds, sore throat Cardiac:  No dizziness, chest pain or heaviness, chest tightness,edema, No JVD Resp:   No cough, -sputum production, -shortness of breath,-wheezing, -hemoptysis,  Other:  All other systems negative   Physical Examination:   General Appearance: No distress  EYES PERRLA, EOM intact.   NECK Supple, No JVD Pulmonary: normal breath sounds, No wheezing.  CardiovascularNormal S1,S2.  No m/r/g.   Abdomen: Benign, Soft, non-tender. Neurology UE/LE 5/5 strength, no focal deficits Ext pulses intact, cap refill intact ALL OTHER ROS ARE NEGATIVE     ASSESSMENT AND PLAN SYNOPSIS  53 year old white female with underlying diagnosis of sleep apnea needs to be reestablished for sleep apnea in the setting of mild intermittent asthma with morbid obesity and deconditioned state  Assessment & Plan OSA (obstructive sleep apnea) Diagnosed of sleep apnea in 2018 noncompliant with CPAP due to shoulder surgery and sleep positioning Recommend sleep study for further evaluation Follow-up with Dr. Jess once completed in 4 weeks Mild intermittent asthma without complication No exacerbation at this time Well-controlled Continue inhalers as  prescribed Avoid Allergens and Irritants Avoid secondhand smoke Avoid SICK contacts Recommend  Masking  when appropriate Recommend Keep up-to-date with vaccinations  Morbid (severe) obesity due to excess calories (HCC) Obesity -recommend significant weight loss -recommend changing diet  Deconditioned state -Recommend increased daily activity and exercise  Primary hypertension Hypertension - Sleep apnea can contribute to hypertension, therefore treatment of sleep apnea is important part of hypertension management.       MEDICATION ADJUSTMENTS/LABS AND TESTS ORDERED: Recommend home sleep study to reestablish diagnosis of sleep apnea Continue inhalers as prescribed Avoid Allergens and Irritants Avoid secondhand smoke Avoid SICK contacts Recommend  Masking  when appropriate Recommend Keep up-to-date with vaccinations Follow-up with Dr. Jess after sleep study completed   CURRENT MEDICATIONS REVIEWED AT LENGTH WITH PATIENT TODAY   Patient  satisfied with Plan of action and management. All questions answered   Follow up 6 months   I spent a total of 65 minutes dedicated to the care of this patient on the date of this encounter to include pre-visit review of records, face-to-face time with the patient discussing conditions above, post visit ordering of testing, clinical documentation with the electronic health record, making appropriate referrals as documented, and communicating necessary information to the patient's healthcare team.     Nickolas Alm Cellar, M.D.  [facility]  Medical Director [facility] Medical Director [employer] Cardio-Pulmonary Department

## 2024-02-19 NOTE — Assessment & Plan Note (Signed)
 Diagnosed of sleep apnea in 2018 noncompliant with CPAP due to shoulder surgery and sleep positioning Recommend sleep study for further evaluation Follow-up with Dr. Jess once completed in 4 weeks

## 2024-02-19 NOTE — Patient Instructions (Signed)
 Recommend home sleep study to reestablish diagnosis of sleep apnea  Continue inhalers as prescribed Avoid Allergens and Irritants Avoid secondhand smoke Avoid SICK contacts Recommend  Masking  when appropriate Recommend Keep up-to-date with vaccinations  Follow-up with Dr. Jess after sleep study completed

## 2024-02-19 NOTE — Assessment & Plan Note (Signed)
 Hypertension - Sleep apnea can contribute to hypertension, therefore treatment of sleep apnea is important part of hypertension management.

## 2024-02-20 ENCOUNTER — Telehealth: Payer: Self-pay | Admitting: Cardiology

## 2024-02-20 ENCOUNTER — Ambulatory Visit: Payer: Self-pay | Admitting: Psychiatry

## 2024-02-20 ENCOUNTER — Encounter: Payer: Self-pay | Admitting: Nurse Practitioner

## 2024-02-20 ENCOUNTER — Ambulatory Visit: Admitting: Nurse Practitioner

## 2024-02-20 DIAGNOSIS — E785 Hyperlipidemia, unspecified: Secondary | ICD-10-CM

## 2024-02-20 DIAGNOSIS — G4733 Obstructive sleep apnea (adult) (pediatric): Secondary | ICD-10-CM | POA: Diagnosis not present

## 2024-02-20 DIAGNOSIS — R7303 Prediabetes: Secondary | ICD-10-CM

## 2024-02-20 LAB — CBC WITH DIFFERENTIAL/PLATELET
Basophils Absolute: 0 K/uL (ref 0.0–0.1)
Basophils Relative: 0.5 % (ref 0.0–3.0)
Eosinophils Absolute: 0.1 K/uL (ref 0.0–0.7)
Eosinophils Relative: 1.5 % (ref 0.0–5.0)
HCT: 44.4 % (ref 36.0–46.0)
Hemoglobin: 14.6 g/dL (ref 12.0–15.0)
Lymphocytes Relative: 34.9 % (ref 12.0–46.0)
Lymphs Abs: 2 K/uL (ref 0.7–4.0)
MCHC: 32.9 g/dL (ref 30.0–36.0)
MCV: 92.4 fl (ref 78.0–100.0)
Monocytes Absolute: 0.4 K/uL (ref 0.1–1.0)
Monocytes Relative: 6.8 % (ref 3.0–12.0)
Neutro Abs: 3.2 K/uL (ref 1.4–7.7)
Neutrophils Relative %: 56.3 % (ref 43.0–77.0)
Platelets: 229 K/uL (ref 150.0–400.0)
RBC: 4.8 Mil/uL (ref 3.87–5.11)
RDW: 13.1 % (ref 11.5–15.5)
WBC: 5.7 K/uL (ref 4.0–10.5)

## 2024-02-20 LAB — COMPREHENSIVE METABOLIC PANEL WITH GFR
ALT: 36 U/L — ABNORMAL HIGH (ref 0–35)
AST: 32 U/L (ref 0–37)
Albumin: 4.4 g/dL (ref 3.5–5.2)
Alkaline Phosphatase: 44 U/L (ref 39–117)
BUN: 11 mg/dL (ref 6–23)
CO2: 30 meq/L (ref 19–32)
Calcium: 9.4 mg/dL (ref 8.4–10.5)
Chloride: 103 meq/L (ref 96–112)
Creatinine, Ser: 0.79 mg/dL (ref 0.40–1.20)
GFR: 85.32 mL/min (ref >60.00)
Glucose, Bld: 120 mg/dL — ABNORMAL HIGH (ref 70–99)
Potassium: 4.3 meq/L (ref 3.5–5.1)
Sodium: 139 meq/L (ref 135–145)
Total Bilirubin: 1.4 mg/dL — ABNORMAL HIGH (ref 0.2–1.2)
Total Protein: 7.3 g/dL (ref 6.0–8.3)

## 2024-02-20 LAB — LIPID PANEL
Cholesterol: 230 mg/dL — ABNORMAL HIGH (ref 0–200)
HDL: 49.6 mg/dL (ref >39.00)
LDL Cholesterol: 156 mg/dL — ABNORMAL HIGH (ref 0–99)
NonHDL: 180.06
Total CHOL/HDL Ratio: 5
Triglycerides: 118 mg/dL (ref 0.0–149.0)
VLDL: 23.6 mg/dL (ref 0.0–40.0)

## 2024-02-20 LAB — HEMOGLOBIN A1C: Hgb A1c MFr Bld: 6.2 % (ref 4.6–6.5)

## 2024-02-20 MED ORDER — TIRZEPATIDE 2.5 MG/0.5ML ~~LOC~~ SOAJ: 2.5000 mg | SUBCUTANEOUS | 2 refills | Status: DC

## 2024-02-20 NOTE — Telephone Encounter (Signed)
 No, I am unsure why she had 2 studies that were ordered.  Her initial study was negative for DVT.

## 2024-02-20 NOTE — Telephone Encounter (Signed)
 Patient would ike to know if she still needs to have 10/03 DVT. She says she already had one done recently.

## 2024-02-20 NOTE — Progress Notes (Signed)
 Established Patient Office Visit  Subjective:  Patient ID: Danielle Irwin, female    DOB: [birth year]  Age: 53 y.o. MRN: [id number]  CC:  Chief Complaint  Patient presents with   Acute Visit    Psychiartrist is recommending GLP1 medication so the patient can start a stronger anti depressant drug that causes weight gain   Discussed the use of a AI scribe software for clinical note transcription with the patient, who gave verbal consent to proceed.  HPI  Danielle Irwin is a 53 year old female who presents for medication management and weight loss consultation.  She has sleep apnea and struggles with CPAP machine use due to discomfort and issues with wearing glasses. A home sleep study is planned to reassess her condition and explore better mask options.  She experiences ongoing migraines and is scheduled for an infusion of a new medication next week.   She follows a food plan from a weight loss program but has experienced slight weight gain. She was advised she might not be eating enough, which she finds confusing. She has not tried GLP in the past . Karna personal or family history of MEN2.   Past Medical History:  Diagnosis Date   Anemia    Anxiety    Asthma    Back pain    Depression    Edema of both lower extremities    Epstein Barr infection    Family history of adverse reaction to anesthesia    FATHER WAS SLOW TO WAKE UP   Fibromyalgia    GERD (gastroesophageal reflux disease)    H/O blood clots    Headache    OCCASIONAL MIGRAINES   Heart murmur Unsure   History of fibromyalgia    HNP (herniated nucleus pulposus), lumbar    Hx of epistaxis    Hypertension    Insomnia    Joint pain    OA (osteoarthritis)    Obesity    PCOS (polycystic ovarian syndrome)    Sensitive skin    Sleep apnea    Vitamin D  deficiency     Past Surgical History:  Procedure Laterality Date   BREAST BIOPSY Left 09/17/2022   Stereo bx, Ribbon Clip, path pending   BREAST BIOPSY Left  09/17/2022   MM LT BREAST BX W LOC DEV 1ST LESION IMAGE BX SPEC STEREO GUIDE 09/17/2022 [facility]   COLONOSCOPY WITH PROPOFOL  N/A 09/09/2022   Procedure: COLONOSCOPY WITH PROPOFOL ;  Surgeon: Therisa Bi, MD;  Location: [facility] ENDOSCOPY;  Service: Gastroenterology;  Laterality: N/A;   LUMBAR LAMINECTOMY/DECOMPRESSION MICRODISCECTOMY Right 06/02/2014   Procedure: MICRO LUMBAR DECOMPRESSION L4 - L5 ON THE RIGHT 1 LEVEL;  Surgeon: Reyes JAYSON Billing, MD;  Location: [facility] ORS;  Service: Orthopedics;  Laterality: Right;   ORIF HUMERUS FRACTURE Left 08/01/2017   Procedure: OPEN REDUCTION INTERNAL FIXATION (ORIF) PROXIMAL HUMERUS FRACTURE;  Surgeon: Kendal Franky SQUIBB, MD;  Location: [facility] OR;  Service: Orthopedics;  Laterality: Left;   WISDOM TOOTH EXTRACTION      Family History  Problem Relation Age of Onset   Diabetes Mother    High blood pressure Mother    High Cholesterol Mother    Depression Mother    Anxiety disorder Mother    Sleep apnea Mother    Obesity Mother    Depression Father    Heart attack Father 103   High blood pressure Father    High Cholesterol Father    Heart disease Father    Heart attack Brother  Depression Maternal Aunt    Breast cancer Maternal Aunt 50   Depression Maternal Grandmother    Heart attack Brother    Ovarian cancer Neg Hx     Social History   Socioeconomic History   Marital status: Single    Spouse name: Not on file   Number of children: 0   Years of education: Not on file   Highest education level: Some college, no degree  Occupational History   Occupation: debt collections  Tobacco Use   Smoking status: Former    Current packs/day: 0.00    Types: Cigarettes    Quit date: 05/26/1990    Years since quitting: 33.7   Smokeless tobacco: Never  Vaping Use   Vaping status: Never Used  Substance and Sexual Activity   Alcohol use: Yes    Comment: social   Drug use: No   Sexual activity: Not Currently    Partners: Male    Birth control/protection:  Post-menopausal  Other Topics Concern   Not on file  Social History Narrative   Not on file   Social Drivers of Health   Financial Resource Strain: High Risk (01/05/2024)   Overall Financial Resource Strain (CARDIA)    Difficulty of Paying Living Expenses: Hard  Food Insecurity: Food Insecurity Present (01/05/2024)   Hunger Vital Sign    Worried About Running Out of Food in the Last Year: Sometimes true    Ran Out of Food in the Last Year: Sometimes true  Transportation Needs: No Transportation Needs (01/05/2024)   PRAPARE - Administrator, Civil Service (Medical): No    Lack of Transportation (Non-Medical): No  Physical Activity: Inactive (01/05/2024)   Exercise Vital Sign    Days of Exercise per Week: 0 days    Minutes of Exercise per Session: Not on file  Stress: Stress Concern Present (01/05/2024)   [employer] of Occupational Health - Occupational Stress Questionnaire    Feeling of Stress: Very much  Social Connections: Socially Isolated (01/05/2024)   Social Connection and Isolation Panel    Frequency of Communication with Friends and Family: Never    Frequency of Social Gatherings with Friends and Family: Never    Attends Religious Services: 1 to 4 times per year    Active Member of [employer] or Organizations: No    Attends Engineer, structural: Not on file    Marital Status: Never married  Intimate Partner Violence: Not on file     Outpatient Medications Prior to Visit  Medication Sig Dispense Refill   acetaminophen  (TYLENOL ) 650 MG CR tablet Take 650 mg by mouth every 8 (eight) hours as needed for pain.     albuterol  (PROVENTIL ) (2.5 MG/3ML) 0.083% nebulizer solution Take 3 mLs (2.5 mg total) by nebulization every 6 (six) hours as needed for wheezing or shortness of breath. 150 mL 1   albuterol  (VENTOLIN  HFA) 108 (90 Base) MCG/ACT inhaler Inhale 2 puffs into the lungs every 6 (six) hours as needed for wheezing or shortness of breath. 8 g 2   Alum &  Mag Hydroxide-Simeth (ANTACID ANTI-GAS PO) Take 1 tablet by mouth daily as needed (for gas).     Ascorbic Acid  (VITAMIN C ) 1000 MG tablet Take 1,000 mg by mouth 2 (two) times daily.     aspirin  EC 81 MG tablet Take 81 mg by mouth daily. Swallow whole.     aspirin -acetaminophen -caffeine (EXCEDRIN MIGRAINE) 250-250-65 MG tablet Take 2 tablets by mouth 2 (two) times daily as needed  for headache or migraine.     b complex vitamins tablet Take 1 tablet by mouth daily.     budesonide -formoterol  (SYMBICORT ) 80-4.5 MCG/ACT inhaler Inhale 2 puffs into the lungs 2 (two) times daily. 1 each 3   busPIRone  (BUSPAR ) 5 MG tablet Take 1 tablet (5 mg total) by mouth 2 (two) times daily. 180 tablet 2   Calcium  Carbonate-Vitamin D  (CALCIUM -D PO) Take 1,200 mg by mouth 2 (two) times daily.     cyclobenzaprine  (FLEXERIL ) 10 MG tablet Take 1 tablet by mouth three times daily as needed for muscle spasm 30 tablet 0   dicyclomine  (BENTYL ) 20 MG tablet Take 1 tablet (20 mg total) by mouth 3 (three) times daily as needed for spasms. 90 tablet 11   ECHINACEA-GOLDEN SEAL PO Take 1 tablet by mouth 2 (two) times daily as needed (immune system boost).     eletriptan  (RELPAX ) 40 MG tablet Take 1 tablet (40 mg total) by mouth as needed for migraine or headache. May repeat in 2 hours if headache persists or recurs.  Maximum 2 tablets in 24 hours. 10 tablet 5   EPINEPHrine  0.3 mg/0.3 mL IJ SOAJ injection INJECT 0.3ML INTO THE MUSCLE ONCE AS DIRECTED 2 each 0   furosemide  (LASIX ) 20 MG tablet Take 1 tablet (20 mg total) by mouth daily. (Patient taking differently: Take 20 mg by mouth daily. Only on the weekends) 30 tablet 3   guaiFENesin -codeine  100-10 MG/5ML syrup Take 5 mLs by mouth at bedtime as needed for cough. 60 mL 0   hydrochlorothiazide  (HYDRODIURIL ) 25 MG tablet Take 25 mg by mouth daily.     hydrOXYzine  (VISTARIL ) 25 MG capsule TAKE 1 CAPSULE BY MOUTH EVERY 8 HOURS AS NEEDED (Patient taking differently: 25 mg every 8 (eight)  hours as needed.) 90 capsule 0   lamoTRIgine  (LAMICTAL ) 25 MG tablet Take 1 tablet (25 mg total) by mouth daily for 15 days, THEN 1 tablet (25 mg total) 2 (two) times daily for 15 days. 45 tablet 0   lisinopril  (ZESTRIL ) 10 MG tablet Take 1 tablet by mouth once daily 90 tablet 1   Magnesium  250 MG TABS Take 500 mg by mouth at bedtime. Dosage varies     Melatonin 10 MG TABS Take 1 tablet by mouth at bedtime.     Multiple Vitamin (MULTIVITAMIN WITH MINERALS) TABS tablet Take 1 tablet by mouth daily. With Elderberry     Multiple Vitamins-Minerals (ZINC  PO) Take 500 mg by mouth daily as needed (immune system boost).     nystatin  (MYCOSTATIN /NYSTOP ) powder Apply 1 Application topically 3 (three) times daily. 60 g 1   ondansetron  (ZOFRAN ) 4 MG tablet Take 1 tablet (4 mg total) by mouth every 8 (eight) hours as needed for nausea or vomiting. 30 tablet 1   pantoprazole  (PROTONIX ) 40 MG tablet Take 1 tablet (40 mg total) by mouth daily. 90 tablet 3   propranolol  ER (INDERAL  LA) 80 MG 24 hr capsule Take 1 capsule (80 mg total) by mouth daily. 90 capsule 1   QULIPTA  60 MG TABS Take 1 tablet by mouth daily.     rosuvastatin  (CRESTOR ) 5 MG tablet Take 1 tablet (5 mg total) by mouth at bedtime. 90 tablet 0   sertraline  (ZOLOFT ) 100 MG tablet Take 1 tablet (100 mg total) by mouth daily. 90 tablet 2   sertraline  (ZOLOFT ) 50 MG tablet Take 1 tablet by mouth once daily 90 tablet 0   traZODone  (DESYREL ) 150 MG tablet Take 1 tablet (150 mg  total) by mouth at bedtime. 90 tablet 1   Ubrogepant  (UBRELVY ) 100 MG TABS Take 1 tablet by mouth at onset of headache. May repeat dose x 1 after 2 hours if needed. Max 200 mg/ 24 hours. 30 tablet 1   Vitamin D , Ergocalciferol , (DRISDOL ) 1.25 MG (50000 UNIT) CAPS capsule 1 po q Wed and 1 po q Sun (Patient taking differently: No sig reported) 8 capsule 0   vitamin k 100 MCG tablet Take 100 mcg by mouth daily.     amoxicillin -clavulanate (AUGMENTIN ) 875-125 MG tablet Take 1 tablet by  mouth 2 (two) times daily. (Patient not taking: Reported on 02/20/2024) 14 tablet 0   bismuth  subsalicylate (PEPTO-BISMOL) 262 MG/15ML suspension Take 30 mLs by mouth 4 (four) times daily -  before meals and at bedtime. (Patient not taking: Reported on 02/20/2024) 360 mL 0   No facility-administered medications prior to visit.    Allergies  Allergen Reactions   Benzalkonium Chloride Anaphylaxis and Hives   Neosporin [Neomycin-Bacitracin  Zn-Polymyx] Anaphylaxis and Hives   Polysporin [Bacitracin -Polymyxin B ] Hives   Bee Venom Hives and Swelling    Throat swelling   Bupropion Hcl Other (See Comments)    angry   Cephalexin Hives and Swelling   Doxycycline  Itching    05/2023   Latex     ROS Review of Systems Negative unless indicated in HPI.    Objective:    Physical Exam Constitutional:      Appearance: Normal appearance. She is obese.  HENT:     Mouth/Throat:     Mouth: Mucous membranes are moist.  Eyes:     Conjunctiva/sclera: Conjunctivae normal.     Pupils: Pupils are equal, round, and reactive to light.  Cardiovascular:     Rate and Rhythm: Normal rate and regular rhythm.     Pulses: Normal pulses.     Heart sounds: Normal heart sounds.  Pulmonary:     Effort: Pulmonary effort is normal.     Breath sounds: Normal breath sounds.  Musculoskeletal:     Cervical back: Normal range of motion. No tenderness.  Skin:    General: Skin is warm.     Findings: No bruising.  Neurological:     General: No focal deficit present.     Mental Status: She is alert and oriented to person, place, and time. Mental status is at baseline.  Psychiatric:        Mood and Affect: Mood normal.        Behavior: Behavior normal.        Thought Content: Thought content normal.        Judgment: Judgment normal.     BP 114/74   Pulse 84   Temp 97.7 F (36.5 C)   Ht 5' 7 (1.702 m)   Wt (!) 380 lb (172.4 kg)   LMP 07/11/2016   SpO2 96%   BMI 59.52 kg/m  Wt Readings from Last 3  Encounters:  02/25/24 (!) 379 lb 9.6 oz (172.2 kg)  02/20/24 (!) 380 lb (172.4 kg)  02/19/24 (!) 383 lb 3.2 oz (173.8 kg)     Health Maintenance  Topic Date Due   Hepatitis C Screening  Never done   Pneumococcal Vaccine: 50+ Years (1 of 2 - PCV) Never done   Hepatitis B Vaccines 19-59 Average Risk (1 of 3 - 19+ 3-dose series) Never done   Zoster Vaccines- Shingrix (1 of 2) Never done   COVID-19 Vaccine (1) 03/06/2024 (Originally 08/03/1975)   Influenza Vaccine  09/07/2024 (Originally 01/09/2024)   Mammogram  09/05/2024   DTaP/Tdap/Td (2 - Td or Tdap) 06/25/2027   Cervical Cancer Screening (HPV/Pap Cotest)  09/02/2027   Colonoscopy  09/08/2032   HIV Screening  Completed   HPV VACCINES  Aged Out   Meningococcal B Vaccine  Aged Out       Topic Date Due   Hepatitis B Vaccines 19-59 Average Risk (1 of 3 - 19+ 3-dose series) Never done    Lab Results  Component Value Date   TSH 2.701 02/18/2024   Lab Results  Component Value Date   WBC 5.7 02/20/2024   HGB 14.6 02/20/2024   HCT 44.4 02/20/2024   MCV 92.4 02/20/2024   PLT 229.0 02/20/2024   Lab Results  Component Value Date   NA 139 02/20/2024   K 4.3 02/20/2024   CO2 30 02/20/2024   GLUCOSE 120 (H) 02/20/2024   BUN 11 02/20/2024   CREATININE 0.79 02/20/2024   BILITOT 1.4 (H) 02/20/2024   ALKPHOS 44 02/20/2024   AST 32 02/20/2024   ALT 36 (H) 02/20/2024   PROT 7.3 02/20/2024   ALBUMIN  4.4 02/20/2024   CALCIUM  9.4 02/20/2024   ANIONGAP 10 08/02/2017   EGFR 86 02/11/2023   GFR 85.32 02/20/2024   Lab Results  Component Value Date   CHOL 230 (H) 02/20/2024   Lab Results  Component Value Date   HDL 49.60 02/20/2024   Lab Results  Component Value Date   LDLCALC 156 (H) 02/20/2024   Lab Results  Component Value Date   TRIG 118.0 02/20/2024   Lab Results  Component Value Date   CHOLHDL 5 02/20/2024   Lab Results  Component Value Date   HGBA1C 6.2 02/20/2024      Assessment & Plan:  Morbid obesity  (HCC) Assessment & Plan: Body mass index is 59.52 kg/m. -Will try GLP for weight loss. Explained mechanism and potential gastrointestinal side effects.     Orders: -     CBC with Differential/Platelet -     Comprehensive metabolic panel with GFR  OSA (obstructive sleep apnea) Assessment & Plan: OSA non complaint with CPAP due to sleep postioning. BMI 59.45. - Will try GLP for weight loss.  -Sleep study pending for further evaluation.     Hyperlipidemia, unspecified hyperlipidemia type Assessment & Plan: Will check lipid panel.     Orders: -     Lipid panel  Prediabetes Assessment & Plan: Will check HgA1c.  Orders: -     Hemoglobin A1c    Follow-up: Return in about 3 months (around 05/21/2024) for chronic management.   Luisalberto Beegle, NP

## 2024-02-23 ENCOUNTER — Encounter: Payer: Self-pay | Admitting: Nurse Practitioner

## 2024-02-23 NOTE — Telephone Encounter (Signed)
Pt needs PA for [facility]-

## 2024-02-23 NOTE — Telephone Encounter (Signed)
 Pt made aware via mychart. Order cancelled

## 2024-02-24 ENCOUNTER — Telehealth: Payer: Self-pay

## 2024-02-24 NOTE — Telephone Encounter (Signed)
 Pharmacy Patient Advocate Encounter   Received notification from CoverMyMeds that prior authorization for Mounjaro  2.5MG /0.5ML auto-injectors is required/requested.   Insurance verification completed.   The patient is insured through [employer] .    Ozempic/Mounjaro  is approved exclusively as an adjunct to diet and exercise to improve glycemic  control in adults with type 2 diabetes mellitus. A review of patient's medical chart reveals no  documented diagnosis of type 2 diabetes or an A1C indicative of diabetes. Therefore, they do not  currently meet the criteria for prior authorization of this medication. If clinically appropriate, alternative  options such as Saxenda, Zepbound , or Georjean may be considered for this patient.

## 2024-02-25 ENCOUNTER — Ambulatory Visit (INDEPENDENT_AMBULATORY_CARE_PROVIDER_SITE_OTHER): Admitting: *Deleted

## 2024-02-25 VITALS — BP 144/78 | HR 63 | Temp 97.6°F | Resp 16 | Ht 67.0 in | Wt 379.6 lb

## 2024-02-25 DIAGNOSIS — G43011 Migraine without aura, intractable, with status migrainosus: Secondary | ICD-10-CM

## 2024-02-25 MED ORDER — SODIUM CHLORIDE 0.9 % IV SOLN
100.0000 mg | Freq: Once | INTRAVENOUS | Status: AC
  Administered 2024-02-25: 100 mg via INTRAVENOUS
  Filled 2024-02-25: qty 1

## 2024-02-25 MED ORDER — SODIUM CHLORIDE 0.9% FLUSH: 20.0000 mL | Freq: Once | INTRAVENOUS | Status: DC

## 2024-02-25 NOTE — Progress Notes (Signed)
 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 AVS Declined  Performed by:  Leita FORBES Miles, LPN

## 2024-02-26 ENCOUNTER — Telehealth: Payer: Self-pay

## 2024-02-26 ENCOUNTER — Other Ambulatory Visit: Payer: Self-pay | Admitting: Nurse Practitioner

## 2024-02-26 ENCOUNTER — Other Ambulatory Visit (HOSPITAL_COMMUNITY): Payer: Self-pay

## 2024-02-26 ENCOUNTER — Ambulatory Visit: Payer: Self-pay | Admitting: Nurse Practitioner

## 2024-02-26 DIAGNOSIS — R7303 Prediabetes: Secondary | ICD-10-CM

## 2024-02-26 DIAGNOSIS — G4733 Obstructive sleep apnea (adult) (pediatric): Secondary | ICD-10-CM

## 2024-02-26 DIAGNOSIS — Z6841 Body Mass Index (BMI) 40.0 and over, adult: Secondary | ICD-10-CM

## 2024-02-26 MED ORDER — TIRZEPATIDE-WEIGHT MANAGEMENT 2.5 MG/0.5ML ~~LOC~~ SOLN: 2.5000 mg | SUBCUTANEOUS | 2 refills | Status: DC

## 2024-02-26 NOTE — Telephone Encounter (Signed)
 Patient needs a PA for Zepbound.

## 2024-02-26 NOTE — Telephone Encounter (Signed)
 I have send zepbound   pt with h/o OSA and morbid obesity.

## 2024-02-26 NOTE — Telephone Encounter (Signed)
 PA request has been Submitted. New Encounter has been or will be created for follow up. For additional info see Pharmacy Prior Auth telephone encounter from 02/26/24.

## 2024-02-26 NOTE — Assessment & Plan Note (Signed)
 Body mass index is 59.52 kg/m. -Will try GLP for weight loss. Explained mechanism and potential gastrointestinal side effects.

## 2024-02-26 NOTE — Assessment & Plan Note (Signed)
 OSA non complaint with CPAP due to sleep postioning. BMI 59.45. - Will try GLP for weight loss.  -Sleep study pending for further evaluation.

## 2024-02-26 NOTE — Assessment & Plan Note (Signed)
 Will check lipid panel

## 2024-02-26 NOTE — Assessment & Plan Note (Signed)
 Will check HgA1c

## 2024-02-26 NOTE — Telephone Encounter (Signed)
 Pharmacy Patient Advocate Encounter   Received notification from Pt Calls Messages that prior authorization for Zepbound  2.5MG /0.5ML pen-injectors is required/requested.   Insurance verification completed.   The patient is insured through [employer] [place] .   Per test claim: PA required; PA submitted to above mentioned insurance via Latent Key/confirmation #[id number] [facility] Status is pending

## 2024-02-29 LAB — GENECONNECT MOLECULAR SCREEN: Genetic Analysis Overall Interpretation: NEGATIVE

## 2024-03-03 ENCOUNTER — Other Ambulatory Visit: Payer: Self-pay

## 2024-03-03 DIAGNOSIS — G4733 Obstructive sleep apnea (adult) (pediatric): Secondary | ICD-10-CM

## 2024-03-04 ENCOUNTER — Ambulatory Visit: Admitting: Nurse Practitioner

## 2024-03-04 ENCOUNTER — Encounter: Payer: Self-pay | Admitting: Neurology

## 2024-03-04 VITALS — BP 118/70 | HR 86 | Temp 98.4°F | Resp 17 | Ht 66.25 in | Wt 380.2 lb

## 2024-03-04 DIAGNOSIS — Z0000 Encounter for general adult medical examination without abnormal findings: Secondary | ICD-10-CM | POA: Diagnosis not present

## 2024-03-04 NOTE — Progress Notes (Signed)
 Established Patient Office Visit  Subjective:  Patient ID: Danielle Irwin, female    DOB: 09/29/1970  Age: 53 y.o. MRN: [id number]  CC:  Chief Complaint  Patient presents with   Annual Exam   Discussed the use of a AI scribe software for clinical note transcription with the patient, who gave verbal consent to proceed.  HPI  Danielle Irwin presents for Annual physical.   Diet: Patient does eat meat. Patient consumes some fruits and veggies. Patient eat some  fried food. Patient drinks water.  Exercise: No  Vaccine Qol:izropwzi Tetanus: 2019 COVID:never Shingles: declined at present  Colonoscopy: 2024 Cervical cancer screening:2024, followed by Gyn  Family history:  Colon cancer: No  Breast cancer:yes, maternal aunt  Dentist: Every 6 months Ophthalmology: Yearly  HIV screening: Completed Hep C screening: incomplete Tobacco use: No Alcohol use: Occasionally  Illicit drugs:  No  HPI   Past Medical History:  Diagnosis Date   Anemia    Anxiety    Asthma    Back pain    Depression    Edema of both lower extremities    Epstein Barr infection    Family history of adverse reaction to anesthesia    FATHER WAS SLOW TO WAKE UP   Fibromyalgia    GERD (gastroesophageal reflux disease)    H/O blood clots    Headache    OCCASIONAL MIGRAINES   Heart murmur Unsure   History of fibromyalgia    HNP (herniated nucleus pulposus), lumbar    Hx of epistaxis    Hypertension    Insomnia    Joint pain    OA (osteoarthritis)    Obesity    PCOS (polycystic ovarian syndrome)    Sensitive skin    Sleep apnea    Vitamin D  deficiency     Past Surgical History:  Procedure Laterality Date   BREAST BIOPSY Left 09/17/2022   Stereo bx, Ribbon Clip, path pending   BREAST BIOPSY Left 09/17/2022   MM LT BREAST BX W LOC DEV 1ST LESION IMAGE BX SPEC STEREO GUIDE 09/17/2022 [facility]   COLONOSCOPY WITH PROPOFOL  N/A 09/09/2022   Procedure: COLONOSCOPY WITH PROPOFOL ;  Surgeon:  Therisa Bi, MD;  Location: [facility] ENDOSCOPY;  Service: Gastroenterology;  Laterality: N/A;   LUMBAR LAMINECTOMY/DECOMPRESSION MICRODISCECTOMY Right 06/02/2014   Procedure: MICRO LUMBAR DECOMPRESSION L4 - L5 ON THE RIGHT 1 LEVEL;  Surgeon: Reyes JAYSON Billing, MD;  Location: [facility];  Service: Orthopedics;  Laterality: Right;   ORIF HUMERUS FRACTURE Left 08/01/2017   Procedure: OPEN REDUCTION INTERNAL FIXATION (ORIF) PROXIMAL HUMERUS FRACTURE;  Surgeon: Kendal Franky SQUIBB, MD;  Location: [facility] OR;  Service: Orthopedics;  Laterality: Left;   WISDOM TOOTH EXTRACTION      Family History  Problem Relation Age of Onset   Diabetes Mother    High blood pressure Mother    High Cholesterol Mother    Depression Mother    Anxiety disorder Mother    Sleep apnea Mother    Obesity Mother    Depression Father    Heart attack Father 54   High blood pressure Father    High Cholesterol Father    Heart disease Father    Heart attack Brother    Depression Maternal Aunt    Breast cancer Maternal Aunt 48   Depression Maternal Grandmother    Heart attack Brother    Ovarian cancer Neg Hx     Social History   Socioeconomic History   Marital status: Single  Spouse name: Not on file   Number of children: 0   Years of education: Not on file   Highest education level: Some college, no degree  Occupational History   Occupation: debt collections  Tobacco Use   Smoking status: Former    Current packs/day: 0.00    Types: Cigarettes    Quit date: 05/26/1990    Years since quitting: 33.8   Smokeless tobacco: Never  Vaping Use   Vaping status: Never Used  Substance and Sexual Activity   Alcohol use: Yes    Comment: social   Drug use: No   Sexual activity: Not Currently    Partners: Male    Birth control/protection: Post-menopausal  Other Topics Concern   Not on file  Social History Narrative   Not on file   Social Drivers of Health   Financial Resource Strain: High Risk (01/05/2024)   Overall  Financial Resource Strain (CARDIA)    Difficulty of Paying Living Expenses: Hard  Food Insecurity: Food Insecurity Present (01/05/2024)   Hunger Vital Sign    Worried About Running Out of Food in the Last Year: Sometimes true    Ran Out of Food in the Last Year: Sometimes true  Transportation Needs: No Transportation Needs (01/05/2024)   PRAPARE - Administrator, Civil Service (Medical): No    Lack of Transportation (Non-Medical): No  Physical Activity: Inactive (01/05/2024)   Exercise Vital Sign    Days of Exercise per Week: 0 days    Minutes of Exercise per Session: Not on file  Stress: Stress Concern Present (01/05/2024)   [employer] of Occupational Health - Occupational Stress Questionnaire    Feeling of Stress: Very much  Social Connections: Socially Isolated (01/05/2024)   Social Connection and Isolation Panel    Frequency of Communication with Friends and Family: Never    Frequency of Social Gatherings with Friends and Family: Never    Attends Religious Services: 1 to 4 times per year    Active Member of [employer] or Organizations: No    Attends Engineer, structural: Not on file    Marital Status: Never married  Intimate Partner Violence: Not on file     Outpatient Medications Prior to Visit  Medication Sig Dispense Refill   acetaminophen  (TYLENOL ) 650 MG CR tablet Take 650 mg by mouth every 8 (eight) hours as needed for pain.     albuterol  (PROVENTIL ) (2.5 MG/3ML) 0.083% nebulizer solution Take 3 mLs (2.5 mg total) by nebulization every 6 (six) hours as needed for wheezing or shortness of breath. 150 mL 1   albuterol  (VENTOLIN  HFA) 108 (90 Base) MCG/ACT inhaler Inhale 2 puffs into the lungs every 6 (six) hours as needed for wheezing or shortness of breath. 8 g 2   Alum & Mag Hydroxide-Simeth (ANTACID ANTI-GAS PO) Take 1 tablet by mouth daily as needed (for gas).     Ascorbic Acid  (VITAMIN C ) 1000 MG tablet Take 1,000 mg by mouth 2 (two) times daily.      aspirin  EC 81 MG tablet Take 81 mg by mouth daily. Swallow whole.     aspirin -acetaminophen -caffeine (EXCEDRIN MIGRAINE) 250-250-65 MG tablet Take 2 tablets by mouth 2 (two) times daily as needed for headache or migraine.     b complex vitamins tablet Take 1 tablet by mouth daily.     budesonide -formoterol  (SYMBICORT ) 80-4.5 MCG/ACT inhaler Inhale 2 puffs into the lungs 2 (two) times daily. 1 each 3   busPIRone  (BUSPAR ) 5 MG tablet  Take 1 tablet (5 mg total) by mouth 2 (two) times daily. 180 tablet 2   Calcium  Carbonate-Vitamin D  (CALCIUM -D PO) Take 1,200 mg by mouth 2 (two) times daily.     cyclobenzaprine  (FLEXERIL ) 10 MG tablet Take 1 tablet by mouth three times daily as needed for muscle spasm 30 tablet 0   dicyclomine  (BENTYL ) 20 MG tablet Take 1 tablet (20 mg total) by mouth 3 (three) times daily as needed for spasms. 90 tablet 11   ECHINACEA-GOLDEN SEAL PO Take 1 tablet by mouth 2 (two) times daily as needed (immune system boost).     eletriptan  (RELPAX ) 40 MG tablet Take 1 tablet (40 mg total) by mouth as needed for migraine or headache. May repeat in 2 hours if headache persists or recurs.  Maximum 2 tablets in 24 hours. 10 tablet 5   EPINEPHrine  0.3 mg/0.3 mL IJ SOAJ injection INJECT 0.3ML INTO THE MUSCLE ONCE AS DIRECTED 2 each 0   furosemide  (LASIX ) 20 MG tablet Take 1 tablet (20 mg total) by mouth daily. 30 tablet 3   hydrochlorothiazide  (HYDRODIURIL ) 25 MG tablet Take 25 mg by mouth daily.     hydrOXYzine  (VISTARIL ) 25 MG capsule TAKE 1 CAPSULE BY MOUTH EVERY 8 HOURS AS NEEDED 90 capsule 0   lamoTRIgine  (LAMICTAL ) 25 MG tablet Take 1 tablet (25 mg total) by mouth daily for 15 days, THEN 1 tablet (25 mg total) 2 (two) times daily for 15 days. 45 tablet 0   lisinopril  (ZESTRIL ) 10 MG tablet Take 1 tablet by mouth once daily 90 tablet 1   Magnesium  250 MG TABS Take 500 mg by mouth at bedtime. Dosage varies     Melatonin 10 MG TABS Take 1 tablet by mouth at bedtime.     Multiple Vitamin  (MULTIVITAMIN WITH MINERALS) TABS tablet Take 1 tablet by mouth daily. With Elderberry     Multiple Vitamins-Minerals (ZINC  PO) Take 500 mg by mouth daily as needed (immune system boost).     nystatin  (MYCOSTATIN /NYSTOP ) powder Apply 1 Application topically 3 (three) times daily. 60 g 1   ondansetron  (ZOFRAN ) 4 MG tablet Take 1 tablet (4 mg total) by mouth every 8 (eight) hours as needed for nausea or vomiting. 30 tablet 1   pantoprazole  (PROTONIX ) 40 MG tablet Take 1 tablet (40 mg total) by mouth daily. 90 tablet 3   propranolol  ER (INDERAL  LA) 80 MG 24 hr capsule Take 1 capsule (80 mg total) by mouth daily. 90 capsule 1   rosuvastatin  (CRESTOR ) 5 MG tablet Take 1 tablet (5 mg total) by mouth at bedtime. 90 tablet 0   sertraline  (ZOLOFT ) 100 MG tablet Take 1 tablet (100 mg total) by mouth daily. 90 tablet 2   sertraline  (ZOLOFT ) 50 MG tablet Take 1 tablet by mouth once daily 90 tablet 0   traZODone  (DESYREL ) 150 MG tablet Take 1 tablet (150 mg total) by mouth at bedtime. 90 tablet 1   Ubrogepant  (UBRELVY ) 100 MG TABS Take 1 tablet by mouth at onset of headache. May repeat dose x 1 after 2 hours if needed. Max 200 mg/ 24 hours. 30 tablet 1   Vitamin D , Ergocalciferol , (DRISDOL ) 1.25 MG (50000 UNIT) CAPS capsule 1 po q Wed and 1 po q Sun 8 capsule 0   vitamin k 100 MCG tablet Take 100 mcg by mouth daily.     amoxicillin -clavulanate (AUGMENTIN ) 875-125 MG tablet Take 1 tablet by mouth 2 (two) times daily. (Patient not taking: Reported on 02/20/2024) 14 tablet  0   bismuth  subsalicylate (PEPTO-BISMOL) 262 MG/15ML suspension Take 30 mLs by mouth 4 (four) times daily -  before meals and at bedtime. (Patient not taking: Reported on 03/04/2024) 360 mL 0   guaiFENesin -codeine  100-10 MG/5ML syrup Take 5 mLs by mouth at bedtime as needed for cough. (Patient not taking: Reported on 03/04/2024) 60 mL 0   QULIPTA  60 MG TABS Take 1 tablet by mouth daily. (Patient not taking: Reported on 03/04/2024)     tirzepatide   (ZEPBOUND ) 2.5 MG/0.5ML injection vial Inject 2.5 mg into the skin once a week. (Patient not taking: Reported on 03/04/2024) 0.5 mL 2   No facility-administered medications prior to visit.    Allergies  Allergen Reactions   Benzalkonium Chloride Anaphylaxis and Hives   Neosporin [Neomycin-Bacitracin  Zn-Polymyx] Anaphylaxis and Hives   Polysporin [Bacitracin -Polymyxin B ] Hives   Bee Venom Hives and Swelling    Throat swelling   Bupropion Hcl Other (See Comments)    angry   Cephalexin Hives and Swelling   Doxycycline  Itching    05/2023   Latex     ROS Review of Systems Negative unless indicated in HPI.    Objective:    Physical Exam Constitutional:      Appearance: Normal appearance. She is normal weight.  HENT:     Head: Normocephalic.     Right Ear: Tympanic membrane normal.     Left Ear: Tympanic membrane normal.     Mouth/Throat:     Mouth: Mucous membranes are moist.  Eyes:     Extraocular Movements: Extraocular movements intact.     Conjunctiva/sclera: Conjunctivae normal.     Pupils: Pupils are equal, round, and reactive to light.  Neck:     Thyroid : No thyroid  mass or thyroid  tenderness.  Cardiovascular:     Rate and Rhythm: Normal rate and regular rhythm.     Pulses: Normal pulses.     Heart sounds: Normal heart sounds. No murmur heard. Pulmonary:     Effort: Pulmonary effort is normal.     Breath sounds: Normal breath sounds.  Abdominal:     General: Bowel sounds are normal.     Palpations: Abdomen is soft. There is no mass.     Tenderness: There is no abdominal tenderness. There is no rebound.  Musculoskeletal:        General: No swelling.     Cervical back: Neck supple. No tenderness.     Right lower leg: No edema.     Left lower leg: No edema.  Skin:    Findings: No bruising, erythema or rash.  Neurological:     General: No focal deficit present.     Mental Status: She is alert and oriented to person, place, and time. Mental status is at  baseline.  Psychiatric:        Mood and Affect: Mood normal.        Behavior: Behavior normal.        Thought Content: Thought content normal.        Judgment: Judgment normal.     BP 118/70 (BP Location: Right Arm, Patient Position: Sitting, Cuff Size: Large) Comment (BP Location): lower arm  Pulse 86   Temp 98.4 F (36.9 C) (Oral)   Resp 17   Ht 5' 6.25 (1.683 m)   Wt (!) 380 lb 4 oz (172.5 kg)   LMP 07/11/2016   SpO2 96%   BMI 60.91 kg/m  Wt Readings from Last 3 Encounters:  03/04/24 (!) 380 lb 4  oz (172.5 kg)  02/25/24 (!) 379 lb 9.6 oz (172.2 kg)  02/20/24 (!) 380 lb (172.4 kg)     Health Maintenance  Topic Date Due   COVID-19 Vaccine (1) Never done   Hepatitis C Screening  Never done   Pneumococcal Vaccine: 50+ Years (1 of 2 - PCV) Never done   Hepatitis B Vaccines 19-59 Average Risk (1 of 3 - 19+ 3-dose series) Never done   Zoster Vaccines- Shingrix (1 of 2) Never done   Influenza Vaccine  09/07/2024 (Originally 01/09/2024)   Mammogram  09/05/2024   DTaP/Tdap/Td (2 - Td or Tdap) 06/25/2027   Cervical Cancer Screening (HPV/Pap Cotest)  09/02/2027   Colonoscopy  09/08/2032   HIV Screening  Completed   HPV VACCINES  Aged Out   Meningococcal B Vaccine  Aged Out       Topic Date Due   Hepatitis B Vaccines 19-59 Average Risk (1 of 3 - 19+ 3-dose series) Never done    Lab Results  Component Value Date   TSH 2.701 02/18/2024   Lab Results  Component Value Date   WBC 5.7 02/20/2024   HGB 14.6 02/20/2024   HCT 44.4 02/20/2024   MCV 92.4 02/20/2024   PLT 229.0 02/20/2024   Lab Results  Component Value Date   NA 139 02/20/2024   K 4.3 02/20/2024   CO2 30 02/20/2024   GLUCOSE 120 (H) 02/20/2024   BUN 11 02/20/2024   CREATININE 0.79 02/20/2024   BILITOT 1.4 (H) 02/20/2024   ALKPHOS 44 02/20/2024   AST 32 02/20/2024   ALT 36 (H) 02/20/2024   PROT 7.3 02/20/2024   ALBUMIN  4.4 02/20/2024   CALCIUM  9.4 02/20/2024   ANIONGAP 10 08/02/2017   EGFR 86  02/11/2023   GFR 85.32 02/20/2024   Lab Results  Component Value Date   CHOL 230 (H) 02/20/2024   Lab Results  Component Value Date   HDL 49.60 02/20/2024   Lab Results  Component Value Date   LDLCALC 156 (H) 02/20/2024   Lab Results  Component Value Date   TRIG 118.0 02/20/2024   Lab Results  Component Value Date   CHOLHDL 5 02/20/2024   Lab Results  Component Value Date   HGBA1C 6.2 02/20/2024      Assessment & Plan:  Annual physical exam Assessment & Plan: Pap smear and chest examination deferred. She is followed by gyn. Uptodate on tdap, colonoscopy and PAP. -Encouraged patient to consume a balanced diet and regular exercise regimen. -Advised to see an eye doctor and dentist annually.       Follow-up: No follow-ups on file.   Larry Knipp, NP

## 2024-03-08 NOTE — Telephone Encounter (Signed)
 Called ins to follow up on PA request. PA is still pending review. Please be advised that most companies allow up to 30 days to make a decision. We will advise when a determination has been made, or follow up in 1 week.   Please reach out to our team, Rx Prior Auth Pool, if you haven't heard back in a week.

## 2024-03-09 NOTE — Assessment & Plan Note (Signed)
 Pap smear and chest examination deferred. She is followed by gyn. Uptodate on tdap, colonoscopy and PAP. -Encouraged patient to consume a balanced diet and regular exercise regimen. -Advised to see an eye doctor and dentist annually.

## 2024-03-10 ENCOUNTER — Other Ambulatory Visit (HOSPITAL_COMMUNITY): Payer: Self-pay

## 2024-03-10 NOTE — Telephone Encounter (Signed)
 Pharmacy Patient Advocate Encounter  Received notification from [employer]   that Prior Authorization for Zepbound  2.5MG /0.5ML pen-injectors  has been APPROVED from 03/10/24 to 10/20/24. Ran test claim, Copay is $30. This test claim was processed through [facility]- copay amounts may vary at other pharmacies due to pharmacy/plan contracts, or as the patient moves through the different stages of their insurance plan.   PA #/Case ID/Reference #: [id number]

## 2024-03-12 ENCOUNTER — Encounter

## 2024-03-12 ENCOUNTER — Ambulatory Visit: Admitting: Neurology

## 2024-03-14 ENCOUNTER — Other Ambulatory Visit: Payer: Self-pay | Admitting: Nurse Practitioner

## 2024-03-14 MED ORDER — ZEPBOUND 2.5 MG/0.5ML ~~LOC~~ SOAJ: 2.5000 mg | SUBCUTANEOUS | 2 refills | Status: AC

## 2024-03-15 DIAGNOSIS — F411 Generalized anxiety disorder: Secondary | ICD-10-CM | POA: Diagnosis not present

## 2024-03-15 DIAGNOSIS — F332 Major depressive disorder, recurrent severe without psychotic features: Secondary | ICD-10-CM | POA: Diagnosis not present

## 2024-03-16 ENCOUNTER — Telehealth: Payer: Self-pay | Admitting: Psychiatry

## 2024-03-16 NOTE — Telephone Encounter (Signed)
 Received short-term disability form for this patient.  Based on our policy we do not complete any disability forms or FMLA for patients not established with us  for more than 6 months.  Will have nurse contact patient to let her know.

## 2024-03-17 ENCOUNTER — Encounter: Payer: Self-pay | Admitting: Neurology

## 2024-03-17 ENCOUNTER — Telehealth: Payer: Self-pay

## 2024-03-17 NOTE — Telephone Encounter (Signed)
 Error

## 2024-03-25 ENCOUNTER — Ambulatory Visit: Admitting: Psychiatry

## 2024-03-27 ENCOUNTER — Encounter

## 2024-03-27 DIAGNOSIS — G4733 Obstructive sleep apnea (adult) (pediatric): Secondary | ICD-10-CM

## 2024-04-05 ENCOUNTER — Other Ambulatory Visit: Payer: Self-pay | Admitting: Nurse Practitioner

## 2024-04-12 ENCOUNTER — Encounter: Payer: Self-pay | Admitting: Radiology

## 2024-04-13 DIAGNOSIS — G4733 Obstructive sleep apnea (adult) (pediatric): Secondary | ICD-10-CM | POA: Diagnosis not present

## 2024-04-15 ENCOUNTER — Ambulatory Visit: Admitting: Psychiatry

## 2024-04-15 ENCOUNTER — Encounter: Payer: Self-pay | Admitting: Psychiatry

## 2024-04-15 VITALS — BP 116/74 | HR 88 | Temp 98.0°F | Ht 66.0 in | Wt 377.0 lb

## 2024-04-15 DIAGNOSIS — F331 Major depressive disorder, recurrent, moderate: Secondary | ICD-10-CM

## 2024-04-15 DIAGNOSIS — G4701 Insomnia due to medical condition: Secondary | ICD-10-CM | POA: Diagnosis not present

## 2024-04-15 DIAGNOSIS — F411 Generalized anxiety disorder: Secondary | ICD-10-CM | POA: Diagnosis not present

## 2024-04-15 MED ORDER — SERTRALINE HCL 50 MG PO TABS: 50.0000 mg | ORAL_TABLET | Freq: Every day | ORAL | 1 refills | Status: AC

## 2024-04-15 MED ORDER — SERTRALINE HCL 100 MG PO TABS: 100.0000 mg | ORAL_TABLET | Freq: Every day | ORAL | 1 refills | Status: AC

## 2024-04-15 MED ORDER — LAMOTRIGINE 25 MG PO TABS: ORAL_TABLET | ORAL | 0 refills | Status: AC

## 2024-04-15 NOTE — Progress Notes (Signed)
 [facility] MD OP Progress Note  04/15/2024 1:49 PM Danielle Irwin  MRN:  [id number]  Chief Complaint:  Chief Complaint  Patient presents with   Follow-up   Anxiety   Depression   Medication Refill   Discussed the use of AI scribe software for clinical note transcription with the patient, who gave verbal consent to proceed.  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  Ongoing issues with anxiety, which she describes as more prominent than her depression, continue to affect her. She states that her depression has improved somewhat and reports that the recent addition of lamotrigine  contributed to this improvement, although she ran out due to a lack of refills. After about 3 weeks on lamotrigine , she began to feel less weighed down by her thoughts and did not experience any side effects. Her current medications include sertraline  100 mg daily with an additional 50 mg daily, lamotrigine  (recently ran out), trazodone   at night for sleep, hydroxyzine  as needed, and buspirone . She recently started Zepbound , prescribed by her primary care provider, and reports that it has helped reduce her cravings and appetite.  Significant stress and difficulty managing it persist. She attended 1 therapy session with Ellouise Hummer and found it helpful, but missed her second appointment due to a family issue. She expresses intent to continue with therapy.  Sleep difficulties remain ongoing, and she notes that her sleep is not the greatest.  She recently completed a home sleep study to assess for persistent sleep apnea and is awaiting follow-up with a sleep specialist. She uses an older CPAP machine but finds it difficult to use due to discomfort sleeping on her back, which her back and shoulder pain worsens. She states that the CPAP mask does not stay on well when she sleeps on her side.  She denies suicidal ideation and thoughts of harming others.    Visit Diagnosis:    ICD-10-CM   1. MDD (major depressive disorder), recurrent episode, moderate (HCC)  F33.1 lamoTRIgine  (LAMICTAL ) 25 MG tablet    sertraline  (ZOLOFT ) 50 MG tablet    sertraline  (ZOLOFT ) 100 MG tablet    2. GAD (generalized anxiety disorder)  F41.1 lamoTRIgine  (LAMICTAL ) 25 MG tablet    sertraline  (ZOLOFT ) 50 MG tablet    3. Insomnia due to medical condition  G47.01    Anxiety, depression , OSA not compliant on CPAP      Past Psychiatric History: I have reviewed past psychiatric history from progress note on 02/18/2024.  Past trials of Wellbutrin-anger  Past Medical History:  Past Medical History:  Diagnosis Date   Anemia    Anxiety    Asthma    Back pain    Depression    Edema of both lower extremities    Epstein Barr infection    Family history of adverse reaction to anesthesia    FATHER WAS SLOW TO WAKE UP   Fibromyalgia    GERD (gastroesophageal reflux disease)    H/O blood clots    Headache    OCCASIONAL MIGRAINES   Heart murmur Unsure   History of fibromyalgia  HNP (herniated nucleus pulposus), lumbar    Hx of epistaxis    Hypertension    Insomnia    Joint pain    OA (osteoarthritis)    Obesity    PCOS (polycystic ovarian syndrome)    Sensitive skin    Sleep apnea    Vitamin D  deficiency     Past Surgical History:  Procedure Laterality Date   BREAST BIOPSY Left 09/17/2022   Stereo bx, Ribbon Clip, path pending   BREAST BIOPSY Left 09/17/2022   MM LT BREAST BX W LOC DEV 1ST LESION IMAGE BX SPEC STEREO GUIDE 09/17/2022  [facility]   COLONOSCOPY WITH PROPOFOL  N/A 09/09/2022   Procedure: COLONOSCOPY WITH PROPOFOL ;  Surgeon: Therisa Bi, MD;  Location: [facility] END[facility];  Service: Gastroenterology;  Laterality: N/A;   LUMBAR LAMINECTOMY/DECOMPRESSION MICRODISCECTOMY Right 06/02/2014   Procedure: MICRO LUMBAR DECOMPRESSION L4 - L5 ON THE RIGHT 1 LEVEL;  Surgeon: Reyes JAYSON Billing, MD;  Location: [facility]S;  Service: Orthopedics;  Laterality: Right;   ORIF HUMERUS FRACTURE Left 08/01/2017   Procedure: OPEN REDUCTION INTERNAL FIXATION (ORIF) PROXIMAL HUMERUS FRACTURE;  Surgeon: Kendal Franky SQUIBB, MD;  Location: [facility] OR;  Service: Orthopedics;  Laterality: Left;   WISDOM TOOTH EXTRACTION      Family Psychiatric History: I have reviewed family psychiatric history from progress note on 02/18/2024.  Family History:  Family History  Problem Relation Age of Onset   Diabetes Mother    High blood pressure Mother    High Cholesterol Mother    Depression Mother    Anxiety disorder Mother    Sleep apnea Mother    Obesity Mother    Depression Father    Heart attack Father 46   High blood pressure Father    High Cholesterol Father    Heart disease Father    Heart attack Brother    Depression Maternal Aunt    Breast cancer Maternal Aunt 50   Depression Maternal Grandmother    Heart attack Brother    Ovarian cancer Neg Hx     Social History: I have reviewed social history from progress note on 02/18/2024. Social History   Socioeconomic History   Marital status: Single    Spouse name: Not on file   Number of children: 0   Years of education: Not on file   Highest education level: Some college, no degree  Occupational History   Occupation: debt collections  Tobacco Use   Smoking status: Former    Current packs/day: 0.00    Types: Cigarettes    Quit date: 05/26/1990    Years since quitting: 33.9   Smokeless tobacco: Never  Vaping Use   Vaping status: Never Used  Substance and Sexual Activity   Alcohol use:  Yes    Comment: social   Drug use: No   Sexual activity: Not Currently    Partners: Male    Birth control/protection: Post-menopausal  Other Topics Concern   Not on file  Social History Narrative   Not on file   Social Drivers of Health   Financial Resource Strain: High Risk (01/05/2024)   Overall Financial Resource Strain (CARDIA)    Difficulty of Paying Living Expenses: Hard  Food Insecurity: Food Insecurity Present (01/05/2024)   Hunger Vital Sign    Worried About Running Out of Food in the Last Year: Sometimes true    Ran Out of Food in the Last Year: Sometimes true  Transportation Needs: No Transportation Needs (01/05/2024)   PRAPARE - Transportation  Lack of Transportation (Medical): No    Lack of Transportation (Non-Medical): No  Physical Activity: Inactive (01/05/2024)   Exercise Vital Sign    Days of Exercise per Week: 0 days    Minutes of Exercise per Session: Not on file  Stress: Stress Concern Present (01/05/2024)   [employer] of Occupational Health - Occupational Stress Questionnaire    Feeling of Stress: Very much  Social Connections: Socially Isolated (01/05/2024)   Social Connection and Isolation Panel    Frequency of Communication with Friends and Family: Never    Frequency of Social Gatherings with Friends and Family: Never    Attends Religious Services: 1 to 4 times per year    Active Member of [employer] or Organizations: No    Attends Engineer, Structural: Not on file    Marital Status: Never married    Allergies:  Allergies  Allergen Reactions   Benzalkonium Chloride Anaphylaxis and Hives   Neosporin [Neomycin-Bacitracin  Zn-Polymyx] Anaphylaxis and Hives   Polysporin [Bacitracin -Polymyxin B ] Hives   Bee Venom Hives and Swelling    Throat swelling   Bupropion Hcl Other (See Comments)    angry   Cephalexin Hives and Swelling   Doxycycline  Itching    05/2023   Latex     Metabolic Disorder Labs: Lab Results  Component Value Date    HGBA1C 6.2 02/20/2024   No results found for: PROLACTIN Lab Results  Component Value Date   CHOL 230 (H) 02/20/2024   TRIG 118.0 02/20/2024   HDL 49.60 02/20/2024   CHOLHDL 5 02/20/2024   VLDL 23.6 02/20/2024   LDLCALC 156 (H) 02/20/2024   LDLCALC 170 (H) 02/11/2023   Lab Results  Component Value Date   TSH 2.701 02/18/2024   TSH 2.85 03/28/2023    Therapeutic Level Labs: No results found for: LITHIUM No results found for: VALPROATE No results found for: CBMZ  Current Medications: Current Outpatient Medications  Medication Sig Dispense Refill   acetaminophen  (TYLENOL ) 650 MG CR tablet Take 650 mg by mouth every 8 (eight) hours as needed for pain.     albuterol  (PROVENTIL ) (2.5 MG/3ML) 0.083% nebulizer solution Take 3 mLs (2.5 mg total) by nebulization every 6 (six) hours as needed for wheezing or shortness of breath. 150 mL 1   albuterol  (VENTOLIN  HFA) 108 (90 Base) MCG/ACT inhaler Inhale 2 puffs into the lungs every 6 (six) hours as needed for wheezing or shortness of breath. 8 g 2   Alum & Mag Hydroxide-Simeth (ANTACID ANTI-GAS PO) Take 1 tablet by mouth daily as needed (for gas).     amoxicillin -clavulanate (AUGMENTIN ) 875-125 MG tablet Take 1 tablet by mouth 2 (two) times daily. (Patient not taking: Reported on 02/20/2024) 14 tablet 0   Ascorbic Acid  (VITAMIN C ) 1000 MG tablet Take 1,000 mg by mouth 2 (two) times daily.     aspirin  EC 81 MG tablet Take 81 mg by mouth daily. Swallow whole.     aspirin -acetaminophen -caffeine (EXCEDRIN MIGRAINE) 250-250-65 MG tablet Take 2 tablets by mouth 2 (two) times daily as needed for headache or migraine.     b complex vitamins tablet Take 1 tablet by mouth daily.     bismuth  subsalicylate (PEPTO-BISMOL) 262 MG/15ML suspension Take 30 mLs by mouth 4 (four) times daily -  before meals and at bedtime. (Patient not taking: Reported on 03/04/2024) 360 mL 0   budesonide -formoterol  (SYMBICORT ) 80-4.5 MCG/ACT inhaler Inhale 2 puffs into the  lungs 2 (two) times daily. 1 each 3   busPIRone  (  BUSPAR ) 5 MG tablet Take 1 tablet (5 mg total) by mouth 2 (two) times daily. 180 tablet 2   Calcium  Carbonate-Vitamin D  (CALCIUM -D PO) Take 1,200 mg by mouth 2 (two) times daily.     cyclobenzaprine  (FLEXERIL ) 10 MG tablet Take 1 tablet by mouth three times daily as needed for muscle spasm 30 tablet 0   dicyclomine  (BENTYL ) 20 MG tablet Take 1 tablet (20 mg total) by mouth 3 (three) times daily as needed for spasms. 90 tablet 11   ECHINACEA-GOLDEN SEAL PO Take 1 tablet by mouth 2 (two) times daily as needed (immune system boost).     eletriptan  (RELPAX ) 40 MG tablet Take 1 tablet (40 mg total) by mouth as needed for migraine or headache. May repeat in 2 hours if headache persists or recurs.  Maximum 2 tablets in 24 hours. 10 tablet 5   EPINEPHrine  0.3 mg/0.3 mL IJ SOAJ injection INJECT 0.3ML INTO THE MUSCLE ONCE AS DIRECTED 2 each 0   furosemide  (LASIX ) 20 MG tablet Take 1 tablet (20 mg total) by mouth daily. 30 tablet 3   guaiFENesin -codeine  100-10 MG/5ML syrup Take 5 mLs by mouth at bedtime as needed for cough. (Patient not taking: Reported on 03/04/2024) 60 mL 0   hydrochlorothiazide  (HYDRODIURIL ) 25 MG tablet Take 25 mg by mouth daily.     hydrOXYzine  (VISTARIL ) 25 MG capsule TAKE 1 CAPSULE BY MOUTH EVERY 8 HOURS AS NEEDED 90 capsule 0   lamoTRIgine  (LAMICTAL ) 25 MG tablet Take 1 tablet (25 mg total) by mouth daily for 15 days, THEN 1 tablet (25 mg total) 2 (two) times daily for 15 days. 45 tablet 0   lisinopril  (ZESTRIL ) 10 MG tablet Take 1 tablet by mouth once daily 90 tablet 1   Magnesium  250 MG TABS Take 500 mg by mouth at bedtime. Dosage varies     Melatonin 10 MG TABS Take 1 tablet by mouth at bedtime.     Multiple Vitamin (MULTIVITAMIN WITH MINERALS) TABS tablet Take 1 tablet by mouth daily. With Elderberry     Multiple Vitamins-Minerals (ZINC  PO) Take 500 mg by mouth daily as needed (immune system boost).     nystatin  (MYCOSTATIN /NYSTOP )  powder Apply 1 Application topically 3 (three) times daily. 60 g 1   ondansetron  (ZOFRAN ) 4 MG tablet TAKE 1 TABLET BY MOUTH EVERY 8 HOURS AS NEEDED FOR NAUSEA AND VOMITING 30 tablet 0   pantoprazole  (PROTONIX ) 40 MG tablet Take 1 tablet (40 mg total) by mouth daily. 90 tablet 3   propranolol  ER (INDERAL  LA) 80 MG 24 hr capsule Take 1 capsule (80 mg total) by mouth daily. 90 capsule 1   QULIPTA  60 MG TABS Take 1 tablet by mouth daily. (Patient not taking: Reported on 03/04/2024)     rosuvastatin  (CRESTOR ) 5 MG tablet Take 1 tablet (5 mg total) by mouth at bedtime. 90 tablet 0   sertraline  (ZOLOFT ) 100 MG tablet Take 1 tablet (100 mg total) by mouth daily. Take along with 50 mg daily , total of 150 mg daily 90 tablet 1   sertraline  (ZOLOFT ) 50 MG tablet Take 1 tablet (50 mg total) by mouth daily. Take along with 100 mg daily , total of 150 mg daily 90 tablet 1   tirzepatide  (ZEPBOUND ) 2.5 MG/0.5ML injection vial Inject 2.5 mg into the skin once a week. (Patient not taking: Reported on 03/04/2024) 0.5 mL 2   tirzepatide  (ZEPBOUND ) 2.5 MG/0.5ML Pen Inject 2.5 mg into the skin once a week. 2 mL 2  traZODone  (DESYREL ) 150 MG tablet TAKE 1 TABLET BY MOUTH AT BEDTIME 90 tablet 0   Ubrogepant  (UBRELVY ) 100 MG TABS Take 1 tablet by mouth at onset of headache. May repeat dose x 1 after 2 hours if needed. Max 200 mg/ 24 hours. 30 tablet 1   Vitamin D , Ergocalciferol , (DRISDOL ) 1.25 MG (50000 UNIT) CAPS capsule 1 po q Wed and 1 po q Sun 8 capsule 0   vitamin k 100 MCG tablet Take 100 mcg by mouth daily.     No current facility-administered medications for this visit.     Musculoskeletal: Strength & Muscle Tone: within normal limits Gait & Station: normal Patient leans: N/A  Psychiatric Specialty Exam: Review of Systems  Psychiatric/Behavioral:  Positive for dysphoric mood and sleep disturbance. The patient is nervous/anxious.     Blood pressure 116/74, pulse 88, temperature 98 F (36.7 C),  temperature source Temporal, height 5' 6 (1.676 m), weight (!) 377 lb (171 kg), last menstrual period 07/11/2016, SpO2 98%.Body mass index is 60.85 kg/m.  General Appearance: Casual  Eye Contact:  Fair  Speech:  Clear and Coherent  Volume:  Normal  Mood:  Anxious and Depressed  Affect:  Congruent  Thought Process:  Goal Directed and Descriptions of Associations: Intact  Orientation:  Full (Time, Place, and Person)  Thought Content: Logical   Suicidal Thoughts:  No  Homicidal Thoughts:  No  Memory:  Immediate;   Fair Recent;   Fair Remote;   Fair  Judgement:  Fair  Insight:  Fair  Psychomotor Activity:  Normal  Concentration:  Concentration: Fair and Attention Span: Fair  Recall:  [employer] of Knowledge: Fair  Language: Fair  Akathisia:  No  Handed:  Right  AIMS (if indicated): not done  Assets:  Communication Skills Desire for Improvement Housing Social Support  ADL's:  Intact  Cognition: WNL  Sleep:  poor   Screenings: GAD-7    Loss Adjuster, Chartered Office Visit from 04/15/2024 in [facility] Office Visit from 03/04/2024 in [facility] Visit from 02/20/2024 in [facility] Visit from 02/18/2024 in [facility] Office Visit from 01/06/2024 in [facility] [employer]  Total GAD-7 Score 15 17 20 20 20    PHQ2-9    Flowsheet Row Office Visit from 04/15/2024 in [facility] Office Visit from 03/04/2024 in [facility] Visit from 02/20/2024 in [facility] Visit from 02/18/2024 in [facility] Office Visit from 01/06/2024 in [facility] [place]  PHQ-2 Total Score 4 6 6 6 6   PHQ-9 Total  Score 18 21 22 23 23    Flowsheet Row Office Visit from 04/15/2024 in [facility] Office Visit from 02/18/2024 in [facility] Admission (Discharged) from 09/09/2022 in [facility]  C-SSRS RISK CATEGORY No Risk No Risk No Risk     Assessment and Plan: Danielle Irwin is a 53 year old Caucasian female who has a history of depression, anxiety, was evaluated in office today, presented to establish care.  1. MDD (major depressive disorder), recurrent episode, moderate (HCC)-unstable Currently reports ongoing depression symptoms although reports while she was taking the Lamictal  it did help.  She however ran out few days ago and did not  request refill. Restart Lamictal  25 mg daily for 2 weeks and then increase to 25 mg twice a day Continue Sertraline  150 mg daily Continue BuSpar  5 mg twice daily  2. GAD (generalized anxiety disorder)-unstable Reports Lamictal  helped while she took it and she has also establish care with therapist although she has not had frequent visits yet. Restart Lamictal  as noted above Continue Hydroxyzine  25 mg 3 times a day as needed Continue Sertraline  and BuSpar  as prescribed Encouraged to follow-up with therapist Ms. Ellouise Hummer on a frequent basis.  3. Insomnia due to medical condition-unstable Sleep problems mostly due to pain, discomfort while laying on her back as well as CPAP mask problems. Continue Trazodone  150 mg at bedtime Continue Melatonin 10 mg at bedtime as needed Pending sleep study, patient was referred for another sleep study.   Reviewed and discussed labs-TSH 2.701-within normal limits.  Follow-up Follow-up in clinic in 3 weeks or sooner if manage   Collaboration of Care: Collaboration of Care: Referral or follow-up with counselor/therapist AEB encouraged to continue psychotherapy sessions.  Patient/Guardian was advised Release of  Information must be obtained prior to any record release in order to collaborate their care with an outside provider. Patient/Guardian was advised if they have not already done so to contact the registration department to sign all necessary forms in order for us  to release information regarding their care.   Consent: Patient/Guardian gives verbal consent for treatment and assignment of benefits for services provided during this visit. Patient/Guardian expressed understanding and agreed to proceed.   This note was generated in part or whole with voice recognition software. Voice recognition is usually quite accurate but there are transcription errors that can and very often do occur. I apologize for any typographical errors that were not detected and corrected.    Elienai Gailey, MD 04/16/2024, 7:30 AM

## 2024-04-16 ENCOUNTER — Encounter: Payer: Self-pay | Admitting: Sleep Medicine

## 2024-04-16 ENCOUNTER — Ambulatory Visit: Admitting: Sleep Medicine

## 2024-04-16 VITALS — BP 132/88 | HR 86 | Temp 97.6°F | Ht 66.0 in | Wt 375.0 lb

## 2024-04-16 DIAGNOSIS — Z6841 Body Mass Index (BMI) 40.0 and over, adult: Secondary | ICD-10-CM

## 2024-04-16 DIAGNOSIS — F5104 Psychophysiologic insomnia: Secondary | ICD-10-CM | POA: Diagnosis not present

## 2024-04-16 DIAGNOSIS — G4733 Obstructive sleep apnea (adult) (pediatric): Secondary | ICD-10-CM | POA: Diagnosis not present

## 2024-04-16 NOTE — Progress Notes (Signed)
 Name:Danielle Irwin MRN: [id number] DOB: 1970-09-17   CHIEF COMPLAINT:  HST F/U   HISTORY OF PRESENT ILLNESS: Ms. Omura is a 53 y.o. w/ a h/o asthma, HTN, anxiety, depression and morbid obesity who presents to follow up on HST results. The patient underwent HST which revealed moderate OSA (AHI 16, O2 nadir 87%).   Bedtime 12 am Sleep onset 2-3 hours Rise time 8-11 am   EPWORTH SLEEP SCORE     02/19/2024   10:00 AM  Results of the Epworth flowsheet  Sitting and reading 2  Watching TV 2  Sitting, inactive in a public place (e.g. a theatre or a meeting) 0  As a passenger in a car for an hour without a break 1  Lying down to rest in the afternoon when circumstances permit 2  Sitting and talking to someone 0  Sitting quietly after a lunch without alcohol 2  In a car, while stopped for a few minutes in traffic 0  Total score 9    PAST MEDICAL HISTORY :   has a past medical history of Anemia, Anxiety, Asthma, Back pain, Depression, Edema of both lower extremities, Epstein Barr infection, Family history of adverse reaction to anesthesia, Fibromyalgia, GERD (gastroesophageal reflux disease), H/O blood clots, Headache, Heart murmur (Unsure), History of fibromyalgia, HNP (herniated nucleus pulposus), lumbar, epistaxis, Hypertension, Insomnia, Joint pain, OA (osteoarthritis), Obesity, PCOS (polycystic ovarian syndrome), Sensitive skin, Sleep apnea, and Vitamin D  deficiency.  has a past surgical history that includes Wisdom tooth extraction; Lumbar laminectomy/decompression microdiscectomy (Right, 06/02/2014); ORIF humerus fracture (Left, 08/01/2017); Colonoscopy with propofol  (N/A, 09/09/2022); Breast biopsy (Left, 09/17/2022); and Breast biopsy (Left, 09/17/2022). Prior to Admission medications   Medication Sig Start Date End Date Taking? Authorizing Provider  acetaminophen  (TYLENOL ) 650 MG CR tablet Take 650 mg by mouth every 8 (eight) hours as needed for pain.   Yes [provider]  albuterol  (PROVENTIL ) (2.5 MG/3ML) 0.083% nebulizer solution Take 3 mLs (2.5 mg total) by nebulization every 6 (six) hours as needed for wheezing or shortness of breath. 06/10/23  Yes Arnett, Rollene MATSU, FNP  albuterol  (VENTOLIN  HFA) 108 (90 Base) MCG/ACT inhaler Inhale 2 puffs into the lungs every 6 (six) hours as needed for wheezing or shortness of breath. 07/18/22  Yes Kaur, Charanpreet, NP  Alum & Mag Hydroxide-Simeth (ANTACID ANTI-GAS PO) Take 1 tablet by mouth daily as needed (for gas).   Yes [provider]  Ascorbic Acid  (VITAMIN C ) 1000 MG tablet Take 1,000 mg by mouth 2 (two) times daily.   Yes [provider]  aspirin  EC 81 MG tablet Take 81 mg by mouth daily. Swallow whole.   Yes [provider]  aspirin -acetaminophen -caffeine (EXCEDRIN MIGRAINE) 250-250-65 MG tablet Take 2 tablets by mouth 2 (two) times daily as needed for headache or migraine.   Yes [provider]  b complex vitamins tablet Take 1 tablet by mouth daily.   Yes [provider]  bismuth  subsalicylate (PEPTO-BISMOL) 262 MG/15ML suspension Take 30 mLs by mouth 4 (four) times daily -  before meals and at bedtime. Patient taking differently: Take 30 mLs by mouth 4 (four) times daily -  before meals and at bedtime. PRN 08/27/18  Yes Nche, Charlotte Lum, NP  budesonide -formoterol  (SYMBICORT ) 80-4.5 MCG/ACT inhaler Inhale 2 puffs into the lungs 2 (two) times daily. 07/30/22  Yes Dineen Rollene MATSU, FNP  busPIRone  (BUSPAR ) 5 MG tablet Take 1 tablet (5 mg total) by mouth 2 (two) times  daily. 09/25/23  Yes Vincente Saber, NP  Calcium  Carbonate-Vitamin D  (CALCIUM -D PO) Take 1,200 mg by mouth 2 (two) times daily.   Yes [provider]  cyclobenzaprine  (FLEXERIL ) 10 MG tablet Take 1 tablet by mouth three times daily as needed for muscle spasm 04/06/24  Yes Kaur, Charanpreet, NP  dicyclomine  (BENTYL ) 20 MG tablet Take 1 tablet (20 mg total) by mouth 3 (three) times daily  as needed for spasms. 08/28/23 08/22/24 Yes [place], PA-C  ECHINACEA-GOLDEN SEAL PO Take 1 tablet by mouth 2 (two) times daily as needed (immune system boost).   Yes [provider]  eletriptan  (RELPAX ) 40 MG tablet Take 1 tablet (40 mg total) by mouth as needed for migraine or headache. May repeat in 2 hours if headache persists or recurs.  Maximum 2 tablets in 24 hours. 02/11/24  Yes Jaffe, Adam R, DO  EPINEPHrine  0.3 mg/0.3 mL IJ SOAJ injection INJECT 0.3ML INTO THE MUSCLE ONCE AS DIRECTED 01/06/24  Yes Kaur, Charanpreet, NP  furosemide  (LASIX ) 20 MG tablet Take 1 tablet (20 mg total) by mouth daily. 12/05/20  Yes Masoud, Sheralyn, MD  hydrochlorothiazide  (HYDRODIURIL ) 25 MG tablet Take 25 mg by mouth daily.   Yes [provider]  hydrOXYzine  (VISTARIL ) 25 MG capsule TAKE 1 CAPSULE BY MOUTH EVERY 8 HOURS AS NEEDED 04/06/24  Yes Kaur, Charanpreet, NP  lamoTRIgine  (LAMICTAL ) 25 MG tablet Take 1 tablet (25 mg total) by mouth daily for 15 days, THEN 1 tablet (25 mg total) 2 (two) times daily for 15 days. 04/15/24 05/15/24 Yes Eappen, Saramma, MD  lisinopril  (ZESTRIL ) 10 MG tablet Take 1 tablet by mouth once daily 08/29/23  Yes Kaur, Charanpreet, NP  Magnesium  250 MG TABS Take 500 mg by mouth at bedtime. Dosage varies   Yes [provider]  Melatonin 10 MG TABS Take 1 tablet by mouth at bedtime.   Yes [provider]  Multiple Vitamin (MULTIVITAMIN WITH MINERALS) TABS tablet Take 1 tablet by mouth daily. With [facility]   Yes [provider]  Multiple Vitamins-Minerals (ZINC  PO) Take 500 mg by mouth daily as needed (immune system boost).   Yes [provider]  nystatin  (MYCOSTATIN /NYSTOP ) powder Apply 1 Application topically 3 (three) times daily. 03/10/23  Yes Gretel App, NP  ondansetron  (ZOFRAN ) 4 MG tablet TAKE 1 TABLET BY MOUTH EVERY 8 HOURS AS NEEDED FOR NAUSEA AND VOMITING 04/06/24  Yes Kaur, Charanpreet, NP  pantoprazole  (PROTONIX ) 40 MG tablet  Take 1 tablet (40 mg total) by mouth daily. 08/28/23 08/22/24 Yes [place], PA-C  propranolol  ER (INDERAL  LA) 80 MG 24 hr capsule Take 1 capsule (80 mg total) by mouth daily. 05/06/23  Yes Gretel App, NP  rosuvastatin  (CRESTOR ) 5 MG tablet Take 1 tablet (5 mg total) by mouth at bedtime. 02/25/23  Yes Opalski, Barnie, DO  sertraline  (ZOLOFT ) 100 MG tablet Take 1 tablet (100 mg total) by mouth daily. Take along with 50 mg daily , total of 150 mg daily 04/15/24  Yes Eappen, Saramma, MD  sertraline  (ZOLOFT ) 50 MG tablet Take 1 tablet (50 mg total) by mouth daily. Take along with 100 mg daily , total of 150 mg daily 04/15/24  Yes Eappen, Saramma, MD  tirzepatide  (ZEPBOUND ) 2.5 MG/0.5ML Pen Inject 2.5 mg into the skin once a week. 03/14/24  Yes Kaur, Charanpreet, NP  traZODone  (DESYREL ) 150 MG tablet TAKE 1 TABLET BY MOUTH AT BEDTIME 04/06/24  Yes Vincente Saber, NP  Ubrogepant  (UBRELVY ) 100 MG TABS Take 1 tablet by  mouth at onset of headache. May repeat dose x 1 after 2 hours if needed. Max 200 mg/ 24 hours. 05/27/23  Yes Kaur, Charanpreet, NP  Vitamin D , Ergocalciferol , (DRISDOL ) 1.25 MG (50000 UNIT) CAPS capsule 1 po q Wed and 1 po q Sun 04/08/23  Yes Opalski, Deborah, DO  vitamin k 100 MCG tablet Take 100 mcg by mouth daily.   Yes [provider]  guaiFENesin -codeine  100-10 MG/5ML syrup Take 5 mLs by mouth at bedtime as needed for cough. Patient not taking: Reported on 04/16/2024 06/10/23   Dineen Rollene MATSU, FNP  QULIPTA  60 MG TABS Take 1 tablet by mouth daily. Patient not taking: Reported on 04/16/2024 11/28/23   [provider]   Allergies  Allergen Reactions   Benzalkonium Chloride Anaphylaxis and Hives   Neosporin [Neomycin-Bacitracin  Zn-Polymyx] Anaphylaxis and Hives   Polysporin [Bacitracin -Polymyxin B ] Hives   Bee Venom Hives and Swelling    Throat swelling   Bupropion Hcl Other (See Comments)    angry   Cephalexin Hives and Swelling   Doxycycline  Itching     05/2023   Latex     FAMILY HISTORY:  family history includes Anxiety disorder in her mother; Breast cancer (age of onset: 61) in her maternal aunt; Depression in her father, maternal aunt, maternal grandmother, and mother; Diabetes in her mother; Heart attack in her brother and brother; Heart attack (age of onset: 33) in her father; Heart disease in her father; High Cholesterol in her father and mother; High blood pressure in her father and mother; Obesity in her mother; Sleep apnea in her mother. SOCIAL HISTORY:  reports that she quit smoking about 33 years ago. Her smoking use included cigarettes. She has never used smokeless tobacco. She reports current alcohol use. She reports that she does not use drugs.   Review of Systems:  Gen:  Denies  fever, sweats, chills weight loss  HEENT: Denies blurred vision, double vision, ear pain, eye pain, hearing loss, nose bleeds, sore throat Cardiac:  No dizziness, chest pain or heaviness, chest tightness,edema, No JVD Resp:   No cough, -sputum production, -shortness of breath,-wheezing, -hemoptysis,  Gi: Denies swallowing difficulty, stomach pain, nausea or vomiting, diarrhea, constipation, bowel incontinence Gu:  Denies bladder incontinence, burning urine Ext:   Denies Joint pain, stiffness or swelling Skin: Denies  skin rash, easy bruising or bleeding or hives Endoc:  Denies polyuria, polydipsia , polyphagia or weight change Psych:   Denies depression, insomnia or hallucinations  Other:  All other systems negative  VITAL SIGNS: BP 132/88   Pulse 86   Temp 97.6 F (36.4 C) (Temporal)   Ht 5' 6 (1.676 m)   Wt (!) 375 lb (170.1 kg)   LMP 07/11/2016   SpO2 96%   BMI 60.53 kg/m    Physical Examination:   General Appearance: No distress  EYES PERRLA, EOM intact.   NECK Supple, No JVD Pulmonary: normal breath sounds, No wheezing.  CardiovascularNormal S1,S2.  No m/r/g.   Abdomen: Benign, Soft, non-tender. Skin:   warm, no rashes, no  ecchymosis  Extremities: normal, no cyanosis, clubbing. Neuro:without focal findings,  speech normal  PSYCHIATRIC: Mood, affect within normal limits.   ASSESSMENT AND PLAN  OSA Restarting patient on CPAP therapy. Fit patient with the Airfit F30i FFM. Discussed the consequences of untreated sleep apnea. Advised not to drive drowsy for safety of patient and others. Will follow up in 3 months.    Morbid obesity Counseled patient on diet and lifestyle modification.  Insomnia Counseled patient on stimulus control and improving sleep hygiene practices.    Patient  satisfied with Plan of action and management. All questions answered  I spent a total of 32 minutes reviewing chart data, face-to-face evaluation with the patient, counseling and coordination of care as detailed above.    Ally Knodel, M.D.  Sleep Medicine [facility]

## 2024-04-16 NOTE — Patient Instructions (Addendum)

## 2024-04-19 ENCOUNTER — Ambulatory Visit: Payer: Self-pay

## 2024-05-03 ENCOUNTER — Telehealth: Admitting: Psychiatry

## 2024-05-04 ENCOUNTER — Telehealth: Payer: Self-pay | Admitting: Psychiatry

## 2024-05-04 DIAGNOSIS — Z91199 Patient's noncompliance with other medical treatment and regimen due to unspecified reason: Secondary | ICD-10-CM

## 2024-05-04 NOTE — Progress Notes (Signed)
 No response to call or text or video invite

## 2024-05-07 ENCOUNTER — Other Ambulatory Visit: Payer: Self-pay | Admitting: Nurse Practitioner

## 2024-05-10 NOTE — Telephone Encounter (Signed)
 Medication refill

## 2024-05-11 ENCOUNTER — Other Ambulatory Visit: Payer: Self-pay

## 2024-05-11 DIAGNOSIS — G43009 Migraine without aura, not intractable, without status migrainosus: Secondary | ICD-10-CM

## 2024-05-13 MED ORDER — UBRELVY 100 MG PO TABS: ORAL_TABLET | ORAL | 0 refills | Status: AC

## 2024-05-13 NOTE — Progress Notes (Signed)
 I have refilled 1 prescription.  Please advise patient to get the next refill from neurology as she is followed by neurology for headache.

## 2024-05-21 ENCOUNTER — Ambulatory Visit: Admitting: Nurse Practitioner

## 2024-05-27 ENCOUNTER — Ambulatory Visit

## 2024-06-05 ENCOUNTER — Other Ambulatory Visit: Payer: Self-pay | Admitting: Neurology

## 2024-07-19 ENCOUNTER — Ambulatory Visit: Admitting: Sleep Medicine

## 2024-09-10 ENCOUNTER — Ambulatory Visit: Admitting: Neurology

## 2024-09-28 ENCOUNTER — Ambulatory Visit: Admitting: Neurology
